# Patient Record
Sex: Male | Born: 1945
Health system: Southern US, Community
[De-identification: ages and names within clinical notes are randomized; demographics above are authoritative.]

## PROBLEM LIST (undated history)

## (undated) DIAGNOSIS — I10 Essential (primary) hypertension: Secondary | ICD-10-CM

## (undated) DIAGNOSIS — I4891 Unspecified atrial fibrillation: Secondary | ICD-10-CM

## (undated) DIAGNOSIS — G4733 Obstructive sleep apnea (adult) (pediatric): Secondary | ICD-10-CM

## (undated) DIAGNOSIS — B9681 Helicobacter pylori [H. pylori] as the cause of diseases classified elsewhere: Principal | ICD-10-CM

## (undated) DIAGNOSIS — G4730 Sleep apnea, unspecified: Secondary | ICD-10-CM

## (undated) DIAGNOSIS — K219 Gastro-esophageal reflux disease without esophagitis: Secondary | ICD-10-CM

## (undated) DIAGNOSIS — IMO0001 Reserved for inherently not codable concepts without codable children: Secondary | ICD-10-CM

## (undated) DIAGNOSIS — K859 Acute pancreatitis without necrosis or infection, unspecified: Secondary | ICD-10-CM

## (undated) DIAGNOSIS — K2970 Gastritis, unspecified, without bleeding: Principal | ICD-10-CM

## (undated) DIAGNOSIS — K2100 Gastro-esophageal reflux disease with esophagitis, without bleeding: Secondary | ICD-10-CM

## (undated) HISTORY — DX: Gastritis, unspecified, without bleeding: K29.70

## (undated) HISTORY — PX: VASECTOMY: SHX75

## (undated) HISTORY — DX: Essential (primary) hypertension: I10

## (undated) HISTORY — DX: Gastro-esophageal reflux disease without esophagitis: K21.9

## (undated) HISTORY — PX: UPPER GASTROINTESTINAL ENDOSCOPY: SHX188

## (undated) HISTORY — PX: CARDIAC ELECTROPHYSIOLOGY STUDY AND ABLATION: SHX1294

## (undated) HISTORY — DX: Helicobacter pylori (H. pylori) as the cause of diseases classified elsewhere: B96.81

## (undated) HISTORY — DX: Unspecified atrial fibrillation: I48.91

---
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
## 2002-07-06 ENCOUNTER — Inpatient Hospital Stay (HOSPITAL_COMMUNITY): Admission: EM | Admit: 2002-07-06 | Discharge: 2002-07-08 | Payer: Self-pay | Admitting: Emergency Medicine

## 2002-07-06 ENCOUNTER — Encounter: Payer: Self-pay | Admitting: *Deleted

## 2002-07-06 ENCOUNTER — Ambulatory Visit (HOSPITAL_COMMUNITY): Admission: RE | Admit: 2002-07-06 | Discharge: 2002-07-06 | Payer: Self-pay | Admitting: Pulmonary Disease

## 2003-02-23 ENCOUNTER — Ambulatory Visit (HOSPITAL_COMMUNITY): Admission: RE | Admit: 2003-02-23 | Discharge: 2003-02-23 | Payer: Self-pay | Admitting: Pulmonary Disease

## 2003-09-24 HISTORY — PX: COLONOSCOPY: SHX174

## 2004-06-12 ENCOUNTER — Emergency Department (HOSPITAL_COMMUNITY): Admission: EM | Admit: 2004-06-12 | Discharge: 2004-06-12 | Payer: Self-pay | Admitting: Family Medicine

## 2004-06-22 ENCOUNTER — Ambulatory Visit (HOSPITAL_COMMUNITY): Admission: RE | Admit: 2004-06-22 | Discharge: 2004-06-22 | Payer: Self-pay | Admitting: Internal Medicine

## 2004-08-30 ENCOUNTER — Ambulatory Visit: Payer: Self-pay | Admitting: Internal Medicine

## 2005-10-16 ENCOUNTER — Ambulatory Visit (HOSPITAL_COMMUNITY): Admission: RE | Admit: 2005-10-16 | Discharge: 2005-10-16 | Payer: Self-pay | Admitting: Pulmonary Disease

## 2006-09-30 ENCOUNTER — Ambulatory Visit (HOSPITAL_COMMUNITY): Admission: RE | Admit: 2006-09-30 | Discharge: 2006-09-30 | Payer: Self-pay | Admitting: Pulmonary Disease

## 2006-10-10 ENCOUNTER — Encounter (HOSPITAL_COMMUNITY): Admission: RE | Admit: 2006-10-10 | Discharge: 2006-11-09 | Payer: Self-pay | Admitting: Pulmonary Disease

## 2009-05-02 ENCOUNTER — Encounter: Payer: Self-pay | Admitting: Cardiology

## 2009-05-09 DIAGNOSIS — I10 Essential (primary) hypertension: Secondary | ICD-10-CM | POA: Insufficient documentation

## 2009-05-18 ENCOUNTER — Ambulatory Visit: Payer: Self-pay | Admitting: Cardiology

## 2009-05-18 DIAGNOSIS — R5383 Other fatigue: Secondary | ICD-10-CM

## 2009-05-18 DIAGNOSIS — R5381 Other malaise: Secondary | ICD-10-CM | POA: Insufficient documentation

## 2009-05-18 DIAGNOSIS — I4892 Unspecified atrial flutter: Secondary | ICD-10-CM | POA: Insufficient documentation

## 2009-05-19 ENCOUNTER — Encounter: Payer: Self-pay | Admitting: Cardiology

## 2009-05-19 ENCOUNTER — Ambulatory Visit (HOSPITAL_COMMUNITY): Admission: RE | Admit: 2009-05-19 | Discharge: 2009-05-19 | Payer: Self-pay | Admitting: Cardiology

## 2009-05-19 ENCOUNTER — Ambulatory Visit: Payer: Self-pay | Admitting: Cardiology

## 2009-05-22 ENCOUNTER — Ambulatory Visit: Payer: Self-pay

## 2009-05-25 ENCOUNTER — Ambulatory Visit: Payer: Self-pay | Admitting: Cardiology

## 2009-05-25 ENCOUNTER — Encounter (INDEPENDENT_AMBULATORY_CARE_PROVIDER_SITE_OTHER): Payer: Self-pay | Admitting: *Deleted

## 2009-05-31 ENCOUNTER — Ambulatory Visit: Payer: Self-pay

## 2009-06-08 ENCOUNTER — Ambulatory Visit: Payer: Self-pay | Admitting: Cardiology

## 2009-06-08 LAB — CONVERTED CEMR LAB: POC INR: 1.5

## 2009-06-15 ENCOUNTER — Ambulatory Visit: Payer: Self-pay | Admitting: Cardiology

## 2009-06-15 LAB — CONVERTED CEMR LAB: POC INR: 2.1

## 2009-06-21 ENCOUNTER — Ambulatory Visit: Payer: Self-pay | Admitting: Cardiology

## 2009-06-21 ENCOUNTER — Ambulatory Visit: Payer: Self-pay | Admitting: Internal Medicine

## 2009-06-28 ENCOUNTER — Ambulatory Visit: Payer: Self-pay | Admitting: Cardiology

## 2009-06-28 ENCOUNTER — Encounter: Payer: Self-pay | Admitting: Internal Medicine

## 2009-06-28 LAB — CONVERTED CEMR LAB: POC INR: 2.7

## 2009-07-05 ENCOUNTER — Ambulatory Visit: Payer: Self-pay | Admitting: Cardiology

## 2009-07-13 ENCOUNTER — Ambulatory Visit: Payer: Self-pay | Admitting: Cardiology

## 2009-07-17 ENCOUNTER — Encounter (INDEPENDENT_AMBULATORY_CARE_PROVIDER_SITE_OTHER): Payer: Self-pay | Admitting: *Deleted

## 2009-07-18 ENCOUNTER — Ambulatory Visit: Payer: Self-pay | Admitting: Cardiology

## 2009-07-21 ENCOUNTER — Ambulatory Visit: Payer: Self-pay | Admitting: Cardiology

## 2009-07-21 ENCOUNTER — Ambulatory Visit: Payer: Self-pay | Admitting: Internal Medicine

## 2009-07-21 LAB — CONVERTED CEMR LAB

## 2009-07-26 ENCOUNTER — Ambulatory Visit: Payer: Self-pay | Admitting: Cardiology

## 2009-08-02 ENCOUNTER — Telehealth: Payer: Self-pay | Admitting: Cardiology

## 2009-08-02 ENCOUNTER — Ambulatory Visit: Payer: Self-pay | Admitting: Cardiology

## 2009-08-03 ENCOUNTER — Encounter: Payer: Self-pay | Admitting: Cardiology

## 2009-08-10 ENCOUNTER — Encounter: Payer: Self-pay | Admitting: Cardiology

## 2009-08-10 ENCOUNTER — Ambulatory Visit (HOSPITAL_COMMUNITY): Admission: RE | Admit: 2009-08-10 | Discharge: 2009-08-10 | Payer: Self-pay | Admitting: Cardiology

## 2009-08-10 LAB — CONVERTED CEMR LAB
Basophils Absolute: 0 10*3/uL (ref 0.0–0.1)
CO2: 27 meq/L (ref 19–32)
Chloride: 97 meq/L (ref 96–112)
Creatinine, Ser: 0.99 mg/dL (ref 0.40–1.50)
Eosinophils Absolute: 0.1 10*3/uL (ref 0.0–0.7)
INR: 1.9
Lymphocytes Relative: 25 % (ref 12–46)
Lymphs Abs: 1.7 10*3/uL (ref 0.7–4.0)
MCHC: 34.7 g/dL (ref 30.0–36.0)
Monocytes Absolute: 0.8 10*3/uL (ref 0.1–1.0)
Monocytes Relative: 12 % (ref 3–12)
Neutro Abs: 4.2 10*3/uL (ref 1.7–7.7)
Neutrophils Relative %: 61 % (ref 43–77)
Platelets: 276 10*3/uL (ref 150–400)
Potassium: 3.9 meq/L (ref 3.5–5.3)
RBC: 4.84 M/uL (ref 4.22–5.81)
RDW: 12.3 % (ref 11.5–15.5)
Sodium: 131 meq/L — ABNORMAL LOW (ref 135–145)
WBC: 6.9 10*3/uL (ref 4.0–10.5)
aPTT: 36 s (ref 24–37)

## 2009-08-14 ENCOUNTER — Ambulatory Visit: Payer: Self-pay | Admitting: Cardiology

## 2009-08-16 ENCOUNTER — Ambulatory Visit (HOSPITAL_COMMUNITY): Admission: RE | Admit: 2009-08-16 | Discharge: 2009-08-17 | Payer: Self-pay | Admitting: Internal Medicine

## 2009-08-17 ENCOUNTER — Ambulatory Visit: Payer: Self-pay | Admitting: Internal Medicine

## 2009-08-21 ENCOUNTER — Ambulatory Visit: Payer: Self-pay | Admitting: Cardiology

## 2009-08-21 LAB — CONVERTED CEMR LAB: POC INR: 1.2

## 2009-08-28 ENCOUNTER — Ambulatory Visit: Payer: Self-pay | Admitting: Cardiovascular Disease

## 2009-09-12 ENCOUNTER — Ambulatory Visit: Payer: Self-pay | Admitting: Internal Medicine

## 2009-11-30 ENCOUNTER — Encounter (INDEPENDENT_AMBULATORY_CARE_PROVIDER_SITE_OTHER): Payer: Self-pay | Admitting: *Deleted

## 2009-12-20 ENCOUNTER — Encounter (INDEPENDENT_AMBULATORY_CARE_PROVIDER_SITE_OTHER): Payer: Self-pay | Admitting: *Deleted

## 2010-10-21 LAB — CONVERTED CEMR LAB
ALT: 28 units/L (ref 0–53)
Albumin: 4.6 g/dL (ref 3.5–5.2)
Basophils Absolute: 0.1 10*3/uL (ref 0.0–0.1)
Basophils Relative: 1 % (ref 0–1)
CO2: 20 meq/L (ref 19–32)
Eosinophils Absolute: 0.2 10*3/uL (ref 0.0–0.7)
HCT: 48.3 % (ref 39.0–52.0)
Lymphocytes Relative: 23 % (ref 12–46)
Lymphs Abs: 2.4 10*3/uL (ref 0.7–4.0)
MCV: 92 fL (ref 78.0–100.0)
Monocytes Absolute: 1.3 10*3/uL — ABNORMAL HIGH (ref 0.1–1.0)
Monocytes Relative: 12 % (ref 3–12)
Neutrophils Relative %: 62 % (ref 43–77)
Platelets: 279 10*3/uL (ref 150–400)
RBC: 5.25 M/uL (ref 4.22–5.81)
Sodium: 139 meq/L (ref 135–145)
Total Bilirubin: 0.5 mg/dL (ref 0.3–1.2)
WBC: 10.3 10*3/uL (ref 4.0–10.5)

## 2010-10-23 NOTE — Letter (Signed)
Summary: Appointment Reminder  [facility]  [address]   [place], [place] [address]   Phone: [phone]  Fax: [phone]       November 30, 2009   Sean Golden [address] [place], [place]  [address] Jun 28, 1946    Dear [name],  We have been unable to reach you by phone to schedule a follow up   appointment that was recommended for you by Dr. Jena Gauss. It is very   important that we reach you to schedule an appointment. We hope that you  allow [place] to participate in your health care needs. Please contact us at  [phone] at your earliest convenience to schedule your appointment.  Sincerely,    [facility] R. Roetta Sessions, M.D.    Kassie Mends, M.D. Lorenza Burton, FNP-BC    Tana Coast, PA-C Phone: [phone]    Fax: [phone]

## 2010-10-23 NOTE — Letter (Signed)
Summary: Generic Letter, Intro to Referring  [facility]  [address]   [place], [place] [address]   Phone: [phone]  Fax: [phone]      December 20, 2009             RE: Sean Golden   Jun 10, 1946                 [address]                 [place], [place]  [address]  Dear Sean Golden,     We received a referral from your office for the patient listed above. We have tried to reach him by phone and by mail to schedule an appointment for consult. He has never responded. Thank you   Sincerely,    [facility] Ph: [phone]   Fax: [phone]

## 2010-12-26 LAB — BASIC METABOLIC PANEL
BUN: 15 mg/dL (ref 6–23)
Glucose, Bld: 111 mg/dL — ABNORMAL HIGH (ref 70–99)

## 2010-12-26 LAB — CBC
HCT: 48.6 % (ref 39.0–52.0)
Hemoglobin: 16.9 g/dL (ref 13.0–17.0)
MCV: 94.2 fL (ref 78.0–100.0)
RDW: 12.3 % (ref 11.5–15.5)
WBC: 8.7 10*3/uL (ref 4.0–10.5)

## 2010-12-26 LAB — PROTIME-INR: INR: 2 — ABNORMAL HIGH (ref 0.00–1.49)

## 2011-02-08 NOTE — Procedures (Signed)
   Sean Golden, Sean Golden                            ACCOUNT NO.:  [id number]   MEDICAL RECORD NO.:  [id number]                   PATIENT TYPE:  INP   LOCATION:  A309                                 FACILITY:  [facility]   PHYSICIAN:  Edward L. Juanetta Gosling, M.D.             DATE OF BIRTH:  12-03-1945   DATE OF PROCEDURE:  07/06/2002  DATE OF DISCHARGE:                                EKG INTERPRETATION   TIME:  1358 .   DESCRIPTION OF PROCEDURE:  The rhythm is a sinus rhythm with a fast heart  rate at about 110.  There are small Q-waves inferiorly which may be of no  significance.   IMPRESSION:  Otherwise, normal electrocardiogram.                                                Edward L. Juanetta Gosling, M.D.    ELH/MEDQ  D:  07/07/2002  T:  07/08/2002  Job:  [id number]

## 2011-02-08 NOTE — H&P (Signed)
Sean Golden, Sean Golden                            ACCOUNT NO.:  [id number]   MEDICAL RECORD NO.:  [id number]                   PATIENT TYPE:  INP   LOCATION:  A309                                 FACILITY:  [facility]   PHYSICIAN:  Edward L. Juanetta Gosling, M.D.             DATE OF BIRTH:  [birth year]   DATE OF ADMISSION:  07/06/2002  DATE OF DISCHARGE:                                HISTORY & PHYSICAL   REASON FOR ADMISSION:  Probable pneumonia.   HISTORY OF PRESENT ILLNESS:  The patient is a 65 year old who was in his  usual state of good health at home when sometime around midnight, he  developed right upper quadrant and right lower chest pain.  This was  pleuritic in nature and has worsened through the day.  He came into the  office where he was noted to have fairly marked pleuritic pain.  He appeared  to be in some moderate distress.  He was found to have clear lungs and  nontender abdomen.  He was sent for ultrasound of the abdomen and then a CT  of the chest to rule out pulmonary embolus.  The CT was negative.  The  ultrasound did not show any definite changes suggestive of a gallbladder  problem, liver disease, etc.  He went to the emergency room.  When he was  seen in the emergency room, he was found to have no evidence of cardiac  disease.  Lab work, in essence, was normal.  He is admitted with a clinical  impression of pneumonia, although this does not show on chest x-ray or CT as  of yet.  He says the pain is tense and pleuritic in nature and he has  difficulty with taking a deep breath particularly on the right side.   PAST MEDICAL HISTORY:  1. Hypertension which was present for awhile and then seemed to go away.  2. Arthritis which had improved with Vioxx which he is not taking anymore.   MEDICATIONS:  No regular prescription medications at this point.   FAMILY HISTORY:  Hypertension.  No known history of lung disease.   SOCIAL HISTORY:  He has worked in Engineering geologist.   REVIEW OF  SYMPTOMS:  Other than as mentioned, negative.   PHYSICAL EXAMINATION:  GENERAL:  Well-developed, uncomfortable appearing  male.  VITAL SIGNS:  Temperature 99 on admission to the floor, heart rate 110,  blood pressure 110/70.  HEENT:  Mucous membranes are dry.  Nose and throat are clear.  Pupils are  reactive.  CHEST:  Does not show any wheezes, rales or rhonchi.  ABDOMEN:  Nontender.  EXTREMITIES:  No edema.  NEUROLOGIC:  Grossly intact.   LABORATORY DATA AND X-RAY FINDINGS:  Unrevealing.   ASSESSMENT:  I think he has pneumonia.    PLAN:  1. Treat him aggressively for pain syndrome, take deeper breaths on that     right side.  2. Will repeat CBC and chest x-ray in the morning.  3. He will be on Levaquin intravenously in the meantime.                                               Edward L. Juanetta Gosling, M.D.    ELH/MEDQ  D:  07/06/2002  T:  07/07/2002  Job:  [id number]

## 2011-02-08 NOTE — Group Therapy Note (Signed)
   Sean Golden, Sean Golden                            ACCOUNT NO.:  [id number]   MEDICAL RECORD NO.:  [id number]                   PATIENT TYPE:  INP   LOCATION:  A309                                 FACILITY:  [facility]   PHYSICIAN:  Edward L. Juanetta Gosling, M.D.             DATE OF BIRTH:  [birth year]   DATE OF PROCEDURE:  07/07/2002  DATE OF DISCHARGE:                                   PROGRESS NOTE   PROBLEM:  Febrile illness, probably pneumonia.   SUBJECTIVE:  The patient says he feels great this morning.  He can take a  deep breath.  He is able to move around.  He ate this morning and feels much  better in general.   PHYSICAL EXAMINATION:  CHEST:  Quite clear.  HEART:  Regular.  ABDOMEN:  Soft.  EXTREMITIES:  No edema.   LABORATORY DATA:  White count is about 5000 today.   ASSESSMENT:  He is better.   PLAN:  The plan is for chest x-ray this morning.                                               Edward L. Juanetta Gosling, M.D.    ELH/MEDQ  D:  07/07/2002  T:  07/08/2002  Job:  [id number]

## 2011-02-08 NOTE — Group Therapy Note (Signed)
   NAMENYRON, MOZER                            ACCOUNT NO.:  [id number]   MEDICAL RECORD NO.:  [id number]                   PATIENT TYPE:   LOCATION:                                       FACILITY:   PHYSICIAN:  Edward L. Juanetta Gosling, M.D.             DATE OF BIRTH:   DATE OF PROCEDURE:  07/08/2002  DATE OF DISCHARGE:                                   PROGRESS NOTE   PROBLEM:  Pneumonia.   SUBJECTIVE:  This patient says that he feels great and has no complaints at  all.   OBJECTIVE:  He has been afebrile since about 4 o'clock yesterday.  His chest  is perfectly clear.  His heart is regular.  His abdomen is soft.  His blood  cultures are negative thus far.   ASSESSMENT:  He is improved.   PLAN:  Plan is for discharge home.  Please see discharge summary for  details.                                               Edward L. Juanetta Gosling, M.D.    ELH/MEDQ  D:  07/08/2002  T:  07/09/2002  Job:  [id number]

## 2011-02-08 NOTE — Procedures (Signed)
   NAMECARSIN, RANDAZZO                            ACCOUNT NO.:  [id number]   MEDICAL RECORD NO.:  [id number]                   PATIENT TYPE:  INP   LOCATION:  A309                                 FACILITY:  [facility]   PHYSICIAN:  Edward L. Juanetta Gosling, M.D.             DATE OF BIRTH:  30-Oct-1945   DATE OF PROCEDURE:  DATE OF DISCHARGE:                                EKG INTERPRETATION   The rhythm is a sinus rhythm with a tachycardiac rate of about 110.  There  are small Q-waves inferiorly, and clinical correlation is being suggested.   IMPRESSION:  Normal electrocardiogram.                                               Edward L. Juanetta Gosling, M.D.    ELH/MEDQ  D:  07/06/2002  T:  07/07/2002  Job:  [id number]

## 2011-02-08 NOTE — Group Therapy Note (Signed)
   NAMERIKI, GEHRING                            ACCOUNT NO.:  [id number]   MEDICAL RECORD NO.:  [id number]                   PATIENT TYPE:  INP   LOCATION:  A309                                 FACILITY:  [facility]   PHYSICIAN:  Edward L. Juanetta Gosling, M.D.             DATE OF BIRTH:  June 21, 1946   DATE OF PROCEDURE:  07/07/2002  DATE OF DISCHARGE:                                   PROGRESS NOTE   PROBLEM:  Clinical diagnosis of pneumonia.   SUBJECTIVE:  The patient has had some fever throughout the day.  He actually  says that he feels pretty well.  He denies any new complaints.  He is having  no pain in his chest.  He is not short of breath like he was yesterday.  He  is not coughing.   OBJECTIVE:  His exam shows that his chest is pretty clear.  Temperature has  been over 100 most of the day today, but now is down to 98.6.  Blood counts  normal.  His chest x-ray showed areas of atelectasis, but no definite  pneumonia.   ASSESSMENT:  I still think he has pneumonia, at least by clinical criteria.   PLAN:  My plan is to have him continue current medications and treatments,  no changes today, and I am going to stop his IV fluids as he is eating and  drinking well, and may well be able to be discharged tomorrow, depending on  how he does.                                               Edward L. Juanetta Gosling, M.D.    ELH/MEDQ  D:  07/07/2002  T:  07/08/2002  Job:  [id number]

## 2011-02-08 NOTE — Op Note (Signed)
Sean Golden, Sean Golden                ACCOUNT NO.:  [id number]   MEDICAL RECORD NO.:  [id number]          PATIENT TYPE:  AMB   LOCATION:  DAY                           FACILITY:  [facility]   PHYSICIAN:  R. Roetta Sessions, M.D. DATE OF BIRTH:  Feb 10, 1946   DATE OF PROCEDURE:  06/22/2004  DATE OF DISCHARGE:                                 OPERATIVE REPORT   PROCEDURE:  Esophagogastroduodenoscopy followed by colonoscopy with biopsy.   INDICATIONS:  The patient is a 65 year old gentleman with worsening of  gastroesophageal reflux disease.  Symptoms he describes as heartburn, taking  Prilosec over-the-counter only sporadically.  Has noted some early satiety  and anorexia the past couple of weeks.  He has not had any abdominal pain,  no melena or rectal bleeding.  Also noted a decrease in stool frequency and  caliber of stool over this same period of time.  No family history of  colorectal neoplasia.  He has never had his colon imaged previously.  EGD  and colonoscopy are now being done.  This approach has been discussed with  the patient at length at the bedside.  The potential risks, benefits and  alternatives have been reviewed.  He is agreeable.  Please see my  handwritten H&P.   DESCRIPTION OF PROCEDURE:  Oxygen saturation, blood pressure, pulse and  respiration were monitored throughout the entirety of both procedures.  Conscious sedation with Versed 5 mg IV and Demerol 75 mg IV in divided  doses.  Cetacaine spray for topical pharyngeal anesthesia.  The instrument  was the Olympus video chip system.   EGD FINDINGS:  Esophagus:  Examination of the tubular esophagus revealed  four-quadrant distal esophageal erosions,.  Each was 2-3 cm in length  extending up from the EG junction.  There was no evidence of Barrett's  esophagus or neoplasm. The EG junction was easily traversed.   Stomach:  The gastric cavity was emptied and insufflated with air.  A  thorough examination of the gastric mucosa  including retroflexion of the  proximal stomach and esophagogastric junction demonstrated no abnormalities.  Pylorus was patent and easily traversed.  Examination of the bulb and second  portion revealed no abnormalities.   THERAPY AND DIAGNOSTIC MANEUVERS:  None.   The patient tolerated the procedure well and was prepared for colonoscopy.   COLONOSCOPY FINDINGS:  Digital rectal exam revealed no abnormalities.  Prep  was good.   Rectum:  Examination of the rectal mucosa revealed an abnormality 10 cm in  from the anal verge.  This was a 2 x 3 cm, somewhat raised adenomatous,  somewhat firm and fibrotic-appearing lesion with multiple overlying  ulcerations.  The longest ulcer was a furrowed, narrow, 2-3 cm ulcer.  There  was a couple of smaller ulcers.  There was also a 3 mm ulcer above this area  in what otherwise appeared to be normal mucosa.  Again this lesion was 10 cm  in from the anal verge.  A thorough examination of the rectum including  retroflexed view of the distal rectum failed to demonstrate any other  abnormalities.  Please  see photos.   Colon:  Colonic mucosa was surveyed from the rectosigmoid junction through  the left, transverse,  right colon to the area of the appendiceal orifice,  ileocecal valve and cecum. These structures were well seen and photographed  for the record.  From this level the scope was slowly withdrawn.  All  previously mentioned mucosal surfaces were again.  The terminal ileum was  also intubated to 5 cm.  The terminal ileum appeared normal.  The patient  had a multitude of diverticula extending all the way from the rectosigmoid  junction to the cecum (where there were multiple).  They were throughout the  colon.  However, the remainder of the colonic mucosa appeared entirely  normal.  The lesion in the rectum was biopsied multiple times.  The patient  tolerated both procedures well and was reactive to endoscopy.   IMPRESSION:   Esophagogastroduodenoscopy:  Four-quadrant distal esophageal  erosions consistent with moderately severe erosive reflux esophagitis;  otherwise normal esophagus, normal stomach, normal D1 and D2.   Colonoscopy:  1.  A 2 x 3 cm area of abnormal-appearing fibrotic mucosa with overlying      ulcerations and at least one satellite ulcer as described above      biopsied.  This was entered 10 cm from the anal verge.  The remainder of      the rectal mucosa appeared normal.   1.  Densely populated pancolonic diverticulum.  The remainder of the colonic      mucosa and terminal ileum appeared normal.   DISCUSSION:  Esophagogastroduodenoscopy:  Four-quadrant distal esophageal  erosions consistent with moderately severe erosive reflux esophagitis;  otherwise normal esophagus, normal stomach, normal D1 and D2.   Colonoscopy:  1.  A 2 x 3 cm area of abnormal-appearing fibrotic mucosa with overlying      ulcerations and at least one satellite ulcer as described above      biopsied.  This was entered 10 cm from the anal verge.  The remainder of      the rectal mucosa appeared normal.   1.  Densely populated pancolonic diverticulum.  The remainder of the colonic      mucosa and terminal ileum appeared normal.   The patient has gastroesophageal reflux disease and it is complicated.  He  needs an anti-reflux diet / lifestyle and needs to be on a proton pump  inhibitor every day from here on out.  Specifically, I have given him  literature on gastroesophageal reflux disease and will start him on Aciphex  20 mg orally daily.  He was admonished to take this medication every day  before breakfast.   As far as findings on colonoscopy are colonoscopy are concerned, I suspect  this is a benign lesion and perhaps a stercoral ulcer or solitary rectal  ulcer syndrome.  This can be seen in a setting of longstanding constipation  rather than producing a symptom of constipation.  Not mentioned above, there is  no history of radiation therapy to the rectum  or prostate.   RECOMMENDATIONS:  1.  He needs to bulk up his diet with fiber in the way of Metamucil,      Citrucel or Benefiber every day.  2.  Initiate Crystallose laxative therapy 20 g orally at bedtime.  3.  Will consider topical anti-inflammatory therapy pending results of the      rectal biopsies which should be on the first of next week.     Otelia Sergeant   RMR/MEDQ  D:  06/22/2004  T:  06/23/2004  Job:  [id number]   cc:   Ramon Dredge L. Juanetta Gosling, M.D.  [address]  [place]  [place] [address]  Fax: [phone]

## 2011-02-08 NOTE — Discharge Summary (Signed)
   Sean Golden, Sean Golden                            ACCOUNT NO.:  [id number]   MEDICAL RECORD NO.:  [id number]                   PATIENT TYPE:  INP   LOCATION:  A309                                 FACILITY:  [facility]   PHYSICIAN:  Edward L. Juanetta Gosling, M.D.             DATE OF BIRTH:  05/13/1946   DATE OF ADMISSION:  07/06/2002  DATE OF DISCHARGE:  07/08/2002                                 DISCHARGE SUMMARY   DISCHARGE DIAGNOSIS:  Pneumonia.   BRIEF HISTORY:  Sean Golden is a 65 year old with complaints of shortness of  breath, right-sided chest discomfort and fever.  He developed this about 12  hours prior to admission.  He came to my office, and he was evaluated and  sent for x-rays.  An ultrasound of the abdomen was negative for any sort of  gallbladder problems.  Then, he had a pulmonary embolism study CT, which was  negative for pulmonary emboli.  He was treated with pain medication in the  emergency room, and he has had so much pain, I doubt he will be discharged.   PHYSICAL EXAMINATION:  GENERAL:  His examination showed that he appeared to  be in some acute pain.  CHEST:  His chest showed that he had a stenting of the right side of his  back and his abdomen was nontender.  CARDIAC:  His heart was regular.   LABORATORY AND ACCESSORY DATA:  His white blood count was normal.  His PO2  is in the 50s on room air.   HOSPITAL COURSE/MEDICATIONS:  1. Morphine sulfate intravenously for pain.  2. Vioxx 50 mg for pain.  3. Levaquin.   He showed rapid improvement, and by 36 hours or so into his hospitalization,  he was improved enough to be discharged.  He was afebrile for about 12 hours  prior to that, had no pain, and had not required any more of his pain  medications.   DISCHARGE MEDICATIONS:  1. Levaquin 500 mg daily x 10 more days.  2. Vioxx 25 mg daily.  3. Codiclear DH 5 cc q. 4 hours p.r.n. cough.  4. Vicodin as needed for pain.     Edward L. Juanetta Gosling, M.D.    ELH/MEDQ  D:  07/08/2002  T:  07/08/2002  Job:  [id number]

## 2011-09-09 ENCOUNTER — Encounter: Payer: Self-pay | Admitting: Cardiology

## 2014-02-23 DIAGNOSIS — H5200 Hypermetropia, unspecified eye: Secondary | ICD-10-CM | POA: Diagnosis not present

## 2014-02-23 DIAGNOSIS — H269 Unspecified cataract: Secondary | ICD-10-CM | POA: Diagnosis not present

## 2014-02-23 DIAGNOSIS — I10 Essential (primary) hypertension: Secondary | ICD-10-CM | POA: Diagnosis not present

## 2014-02-23 DIAGNOSIS — H35039 Hypertensive retinopathy, unspecified eye: Secondary | ICD-10-CM | POA: Diagnosis not present

## 2014-05-23 DIAGNOSIS — R7989 Other specified abnormal findings of blood chemistry: Secondary | ICD-10-CM | POA: Diagnosis not present

## 2014-05-23 DIAGNOSIS — I10 Essential (primary) hypertension: Secondary | ICD-10-CM | POA: Diagnosis not present

## 2014-05-23 DIAGNOSIS — R0602 Shortness of breath: Secondary | ICD-10-CM | POA: Diagnosis not present

## 2014-05-23 DIAGNOSIS — E785 Hyperlipidemia, unspecified: Secondary | ICD-10-CM | POA: Diagnosis not present

## 2014-05-24 DIAGNOSIS — I4891 Unspecified atrial fibrillation: Secondary | ICD-10-CM

## 2014-05-24 HISTORY — DX: Unspecified atrial fibrillation: I48.91

## 2014-05-27 ENCOUNTER — Ambulatory Visit (INDEPENDENT_AMBULATORY_CARE_PROVIDER_SITE_OTHER): Payer: Medicare Other | Admitting: Cardiovascular Disease

## 2014-05-27 VITALS — BP 106/76 | HR 100 | Ht 71.0 in | Wt 250.0 lb

## 2014-05-27 DIAGNOSIS — R0602 Shortness of breath: Secondary | ICD-10-CM | POA: Diagnosis not present

## 2014-05-27 DIAGNOSIS — I517 Cardiomegaly: Secondary | ICD-10-CM

## 2014-05-27 DIAGNOSIS — I4891 Unspecified atrial fibrillation: Secondary | ICD-10-CM | POA: Diagnosis not present

## 2014-05-27 DIAGNOSIS — G4730 Sleep apnea, unspecified: Secondary | ICD-10-CM

## 2014-05-27 DIAGNOSIS — R5383 Other fatigue: Secondary | ICD-10-CM

## 2014-05-27 DIAGNOSIS — R5381 Other malaise: Secondary | ICD-10-CM | POA: Diagnosis not present

## 2014-05-27 DIAGNOSIS — I7789 Other specified disorders of arteries and arterioles: Secondary | ICD-10-CM | POA: Insufficient documentation

## 2014-05-27 DIAGNOSIS — Z8679 Personal history of other diseases of the circulatory system: Secondary | ICD-10-CM

## 2014-05-27 DIAGNOSIS — I480 Paroxysmal atrial fibrillation: Secondary | ICD-10-CM | POA: Insufficient documentation

## 2014-05-27 DIAGNOSIS — I10 Essential (primary) hypertension: Secondary | ICD-10-CM

## 2014-05-27 LAB — CBC
HEMATOCRIT: 45 % (ref 39.0–52.0)
Hemoglobin: 15.8 g/dL (ref 13.0–17.0)
MCH: 31.5 pg (ref 26.0–34.0)
MCHC: 35.1 g/dL (ref 30.0–36.0)
MCV: 89.6 fL (ref 78.0–100.0)
Platelets: 325 10*3/uL (ref 150–400)
RBC: 5.02 MIL/uL (ref 4.22–5.81)
RDW: 13.6 % (ref 11.5–15.5)
WBC: 10.3 10*3/uL (ref 4.0–10.5)

## 2014-05-27 LAB — BASIC METABOLIC PANEL
BUN: 17 mg/dL (ref 6–23)
CO2: 21 mEq/L (ref 19–32)
Calcium: 9.5 mg/dL (ref 8.4–10.5)
Chloride: 105 mEq/L (ref 96–112)
Creat: 1.01 mg/dL (ref 0.50–1.35)
Glucose, Bld: 115 mg/dL — ABNORMAL HIGH (ref 70–99)
POTASSIUM: 4.4 meq/L (ref 3.5–5.3)
SODIUM: 138 meq/L (ref 135–145)

## 2014-05-27 LAB — T4, FREE: FREE T4: 1.02 ng/dL (ref 0.80–1.80)

## 2014-05-27 LAB — TSH: TSH: 1.537 u[IU]/mL (ref 0.350–4.500)

## 2014-05-27 MED ORDER — DILTIAZEM HCL ER COATED BEADS 120 MG PO CP24: 120.0000 mg | ORAL_CAPSULE | Freq: Every day | ORAL | Status: DC

## 2014-05-27 MED ORDER — LOSARTAN POTASSIUM 50 MG PO TABS: 50.0000 mg | ORAL_TABLET | Freq: Every day | ORAL | Status: DC

## 2014-05-27 MED ORDER — APIXABAN 5 MG PO TABS: 5.0000 mg | ORAL_TABLET | Freq: Two times a day (BID) | ORAL | Status: DC

## 2014-05-27 NOTE — Patient Instructions (Addendum)
Your physician recommends that you schedule a follow-up appointment in: 2 weeks    Your physician has recommended you make the following change in your medication:   STOP Aspirin  DECREASE Losartan to 50 mg daily    START Eliquis 5 mg twice a day I have given you samples today plus a free 30 day supply rx card   START Cardizem 120 mg daily   Please get blood work today (CBC,BMET,TSH,Free T4)     Thank you for choosing [facility] !

## 2014-05-27 NOTE — Progress Notes (Signed)
Patient ID: Sean Golden, male   DOB: [birth year], 68 y.o.   MRN: [id number]       CARDIOLOGY CONSULT NOTE  Patient ID: Sean Golden MRN: [id number] DOB/AGE: 1946/07/13 68 y.o.  Admit date: (Not on file) Primary Physician HAWKINS,EDWARD L, MD  Reason for Consultation: SOB, weakness, h/o atrial flutter ablation  HPI: The patient is a 68 year old male with a past medical history significant for an atrial flutter ablation several years ago as well as hypertension and gastroesophageal reflux disease. He has been experiencing exertional dyspnea and diminished energy levels for the past one year, but moreso over the past month. He is on testosterone replacement therapy but this has not helped alleviate his symptoms. A recent hemoglobin A1c was 5.8%. He also reportedly has a history of hyperlipidemia. He underwent pulmonary function testing which demonstrated normal spirometry. An echocardiogram performed in 2010 demonstrated normal left ventricular systolic function, EF 33-35%, mild LVH, grade 2 diastolic dysfunction, mild aortic root dilatation, mild right ventricular enlargement and mild left atrial enlargement. He occasionally has what he describes as "chest pings" which are infrequent, lasting a second, and spontaneously resolve. He denies orthopnea, paroxysmal nocturnal dyspnea, palpitations, lightheadedness, dizziness, leg swelling and syncope. He denies exertional chest discomfort. Upon further discussion, his wife tells him that he snores a lot and stops breathing at night.  ECG performed in the office today demonstrates atrial fibrillation and flutter with a rapid ventricular response, heart rate 109 beats per minute.  Soc: Nonsmoker. Married.  Fam: Brother has arrhythmia and needs ablation. Father died of MI at 65.  No Known Allergies  Current Outpatient Prescriptions  Medication Sig Dispense Refill  . aspirin 81 MG tablet Take 81 mg by mouth daily.      Marland Kitchen losartan (COZAAR) 100  MG tablet Take 100 mg by mouth daily.       . pantoprazole (PROTONIX) 40 MG tablet Take 40 mg by mouth daily.        No current facility-administered medications for this visit.    Past Medical History  Diagnosis Date  . Hypertension     No past surgical history on file.  History   Social History  . Marital Status: Married    Spouse Name: N/A    Number of Children: N/A  . Years of Education: N/A   Occupational History  . Full time    Social History Main Topics  . Smoking status: Former Smoker -- 0.50 packs/day    Types: Cigarettes    Start date: 05/28/1967    Quit date: 05/27/1984  . Smokeless tobacco: Not on file  . Alcohol Use: Yes  . Drug Use: Not on file  . Sexual Activity: Not on file   Other Topics Concern  . Not on file   Social History Narrative   Married   No regular exercise     No family history of premature CAD in 1st degree relatives.  Prior to Admission medications   Medication Sig Start Date End Date Taking? Authorizing Provider  aspirin 81 MG tablet Take 81 mg by mouth daily.   Yes Historical Provider, MD  losartan (COZAAR) 100 MG tablet Take 100 mg by mouth daily.  05/25/14   Historical Provider, MD  pantoprazole (PROTONIX) 40 MG tablet Take 40 mg by mouth daily.  05/25/14   Historical Provider, MD     Review of systems complete and found to be negative unless listed above in HPI     Physical exam Blood pressure  106/76, pulse 100, height 5\' 11"  (1.803 m), weight 250 lb (113.399 kg). General: NAD, overweight Neck: No JVD, no thyromegaly or thyroid nodule.  Lungs: Clear to auscultation bilaterally with normal respiratory effort. CV: Nondisplaced PMI. Irregular rhythm, normal S1/S2, no S3, no murmur.  No peripheral edema.  No carotid bruit.  Normal pedal pulses.  Abdomen: Soft, nontender, no hepatosplenomegaly, no distention.  Skin: Intact without lesions or rashes.  Neurologic: Alert and oriented x 3.  Psych: Normal affect. Extremities:  No clubbing or cyanosis.  HEENT: Normal.   ECG: Most recent ECG reviewed.  Labs:   Lab Results  Component Value Date   WBC 8.7 08/16/2009   HGB 16.9 08/16/2009   HCT 48.6 08/16/2009   MCV 94.2 08/16/2009   PLT 291 08/16/2009   No results found for this basename: NA, K, CL, CO2, BUN, CREATININE, CALCIUM, LABALBU, PROT, BILITOT, ALKPHOS, ALT, AST, GLUCOSE,  in the last 168 hours No results found for this basename: CKTOTAL, CKMB, CKMBINDEX, TROPONINI    No results found for this basename: CHOL   No results found for this basename: HDL   No results found for this basename: LDLCALC   No results found for this basename: TRIG   No results found for this basename: CHOLHDL   No results found for this basename: LDLDIRECT         Studies: No results found.  ASSESSMENT AND PLAN:  1. SOB and fatigue: His symptoms are likely reflective of new onset and rapid atrial fibrillation. Potential etiologies include long-standing essential hypertension, undiagnosed hypothyroidism, and what appears to be undiagnosed sleep apnea. I will obtain a TSH and free T4. Further management discussed below.  2. New-onset atrial fibrillation: His symptoms are likely reflective of new onset and rapid atrial fibrillation. Potential etiologies include long-standing essential hypertension, undiagnosed hypothyroidism, and what appears to be undiagnosed sleep apnea. I will obtain a TSH and free T4.  I will start long-acting diltiazem 120 mg daily for heart rate control. In order to prevent hypotension, I will reduce losartan to 50 mg daily. CHADS-VASC score 2 (age, HTN) thus at moderate risk for CVA. For this reason, I will initiate anticoagulation with Eliquis 5 mg twice daily. I will also obtain a CBC and basic metabolic panel. Once his heart rate is under better control, I will obtain an echocardiogram to assess left ventricular systolic function in order to make certain he has not developed a tachycardia mediated  cardiomyopathy. I will also assess left atrial size, right ventricular size and aortic root diameter.  3. Sleep apnea: I will eventually obtain a sleep study as he has classic symptoms of obstructive sleep apnea. This is a known etiology of atrial fibrillation as well.  4. Aortic root dilatation: Mildly dilated in 2010 with a diameter of 41 mm. I will reassess with an echocardiogram in the near future.  5. Essential HTN: It is presently low normal. As I mentioned acting diltiazem, and in order to prevent hypotension, I will decrease losartan to 50 mg daily.  6. RV enlargement: I will reassess with an echocardiogram in the near future.  Dispo: f/u 2 weeks.   Signed: Kate Sable, M.D., F.A.C.C.  05/27/2014, 8:56 AM

## 2014-05-31 ENCOUNTER — Telehealth: Payer: Self-pay | Admitting: *Deleted

## 2014-05-31 NOTE — Telephone Encounter (Signed)
Message copied by Desma Mcgregor on Tue May 31, 2014 11:52 AM ------      Message from: Kate Sable A      Created: Tue May 31, 2014 11:29 AM       Ok. ------

## 2014-05-31 NOTE — Telephone Encounter (Signed)
Notified pt of results. Forwarded to Dr. Luan Pulling

## 2014-06-08 ENCOUNTER — Encounter: Payer: Self-pay | Admitting: Cardiovascular Disease

## 2014-06-08 ENCOUNTER — Ambulatory Visit (INDEPENDENT_AMBULATORY_CARE_PROVIDER_SITE_OTHER): Payer: Medicare Other | Admitting: Cardiovascular Disease

## 2014-06-08 VITALS — BP 130/96 | HR 83 | Ht 71.0 in | Wt 249.0 lb

## 2014-06-08 DIAGNOSIS — I517 Cardiomegaly: Secondary | ICD-10-CM

## 2014-06-08 DIAGNOSIS — R5381 Other malaise: Secondary | ICD-10-CM

## 2014-06-08 DIAGNOSIS — I10 Essential (primary) hypertension: Secondary | ICD-10-CM | POA: Diagnosis not present

## 2014-06-08 DIAGNOSIS — I7789 Other specified disorders of arteries and arterioles: Secondary | ICD-10-CM

## 2014-06-08 DIAGNOSIS — I4891 Unspecified atrial fibrillation: Secondary | ICD-10-CM

## 2014-06-08 DIAGNOSIS — G4730 Sleep apnea, unspecified: Secondary | ICD-10-CM

## 2014-06-08 DIAGNOSIS — R5383 Other fatigue: Secondary | ICD-10-CM

## 2014-06-08 DIAGNOSIS — R0602 Shortness of breath: Secondary | ICD-10-CM

## 2014-06-08 NOTE — Patient Instructions (Signed)
Your physician recommends that you schedule a follow-up appointment in: 1 month with Dr. Bronson Ing  Your physician recommends that you continue on your current medications as directed. Please refer to the Current Medication list given to you today.  Your physician has requested that you have an echocardiogram. Echocardiography is a painless test that uses sound waves to create images of your heart. It provides your doctor with information about the size and shape of your heart and how well your heart's chambers and valves are working. This procedure takes approximately one hour. There are no restrictions for this procedure.  Your physician has recommended that you have a sleep study. This test records several body functions during sleep, including: brain activity, eye movement, oxygen and carbon dioxide blood levels, heart rate and rhythm, breathing rate and rhythm, the flow of air through your mouth and nose, snoring, body muscle movements, and chest and belly movement.  Thank you for choosing [facility]!!

## 2014-06-08 NOTE — Progress Notes (Signed)
Patient ID: Sean Golden, male   DOB: 08/04/1946, 68 y.o.   MRN: [id number]      SUBJECTIVE: The patient returns for follow up of new onset atrial fibrillation. He previously complained of shortness of breath and fatigue.TSH, free T4, basic metabolic panel and CBC were all normal. He is less short of breath now, but does say energy levels remain somewhat low. He had a nosebleed yesterday but denies any hematuria and hematochezia/melena.   Review of Systems: As per "subjective", otherwise negative.  No Known Allergies  Current Outpatient Prescriptions  Medication Sig Dispense Refill  . apixaban (ELIQUIS) 5 MG TABS tablet Take 1 tablet (5 mg total) by mouth 2 (two) times daily.  60 tablet  6  . diltiazem (CARDIZEM CD) 120 MG 24 hr capsule Take 1 capsule (120 mg total) by mouth daily.  90 capsule  3  . losartan (COZAAR) 50 MG tablet Take 1 tablet (50 mg total) by mouth daily.  90 tablet  3  . pantoprazole (PROTONIX) 40 MG tablet Take 40 mg by mouth daily.        No current facility-administered medications for this visit.    Past Medical History  Diagnosis Date  . Hypertension     No past surgical history on file.  History   Social History  . Marital Status: Married    Spouse Name: N/A    Number of Children: N/A  . Years of Education: N/A   Occupational History  . Full time    Social History Main Topics  . Smoking status: Former Smoker -- 0.50 packs/day for 15 years    Types: Cigarettes    Start date: 05/28/1967    Quit date: 05/27/1984  . Smokeless tobacco: Never Used  . Alcohol Use: Yes  . Drug Use: Not on file  . Sexual Activity: Not on file   Other Topics Concern  . Not on file   Social History Narrative   Married   No regular exercise     Filed Vitals:   06/08/14 1251  BP: 130/96  Pulse: 83  Height: 5\' 11"  (1.803 m)  Weight: 249 lb (112.946 kg)  SpO2: 97%    PHYSICAL EXAM General: NAD, obese HEENT: Normal. Neck: No JVD, no  thyromegaly. Lungs: Clear to auscultation bilaterally with normal respiratory effort. CV: Nondisplaced PMI.  Regular rate and rhythm, normal S1/S2, no S3/S4, no murmur. No pretibial or periankle edema.  No carotid bruit.  Normal pedal pulses.  Abdomen: Soft, nontender, no hepatosplenomegaly, no distention.  Neurologic: Alert and oriented x 3.  Psych: Normal affect. Skin: Normal. Musculoskeletal: Normal range of motion, no gross deformities. Extremities: No clubbing or cyanosis.   ECG: Most recent ECG reviewed.      ASSESSMENT AND PLAN: 1. SOB and fatigue: His symptoms have improved with respect to shortness of breath, but energy levels remain low. Symptoms were likely reflective of new onset and rapid atrial fibrillation. Rate is now controlled and rhythm is regular. Potential etiologies for atrial fibrillation include long-standing essential hypertension and what appears to be undiagnosed sleep apnea.  Will pursue a sleep study.  2. New-onset atrial fibrillation: Rhythm is now regular and rate is normal. Potential etiologies for atrial fibrillation include long-standing essential hypertension and what appears to be undiagnosed sleep apnea. TSH and free T4 were normal. I will continue long-acting diltiazem 120 mg daily for heart rate control. CHADS-VASC score 2 (age, HTN) thus at moderate risk for CVA. For this reason, I will continue  anticoagulation with Eliquis 5 mg twice daily. I asked him to monitor for further nosebleeds. Now that heart rate is under control, I will obtain an echocardiogram to assess left ventricular systolic function in order to make certain he has not developed a tachycardia mediated cardiomyopathy. I will also assess left atrial size, right ventricular size and aortic root diameter.   3. Sleep apnea: I will obtain a sleep study as he has classic symptoms of obstructive sleep apnea. This is a known etiology of atrial fibrillation as well. I warned him about other  deleterious effects of untreated sleep apnea including MI.  4. Aortic root dilatation: Mildly dilated in 2010 with a diameter of 41 mm. I will reassess with an echocardiogram.   5. Essential HTN: Mildly elevated DBP. Would consider increasing losartan in the future if this persists.   6. RV enlargement: I will reassess with an echocardiogram.   Dispo: f/u 1 month.   Kate Sable, M.D., F.A.C.C.

## 2014-06-09 ENCOUNTER — Ambulatory Visit (HOSPITAL_COMMUNITY)
Admission: RE | Admit: 2014-06-09 | Discharge: 2014-06-09 | Disposition: A | Discharge status: Home / Self Care | Payer: Medicare Other | Source: Ambulatory Visit | Attending: Cardiovascular Disease | Admitting: Cardiovascular Disease

## 2014-06-09 DIAGNOSIS — I059 Rheumatic mitral valve disease, unspecified: Secondary | ICD-10-CM | POA: Insufficient documentation

## 2014-06-09 DIAGNOSIS — R0609 Other forms of dyspnea: Secondary | ICD-10-CM | POA: Diagnosis not present

## 2014-06-09 DIAGNOSIS — I4891 Unspecified atrial fibrillation: Secondary | ICD-10-CM | POA: Diagnosis not present

## 2014-06-09 DIAGNOSIS — I10 Essential (primary) hypertension: Secondary | ICD-10-CM | POA: Insufficient documentation

## 2014-06-09 DIAGNOSIS — I517 Cardiomegaly: Secondary | ICD-10-CM | POA: Diagnosis not present

## 2014-06-09 DIAGNOSIS — R079 Chest pain, unspecified: Secondary | ICD-10-CM | POA: Insufficient documentation

## 2014-06-09 DIAGNOSIS — I079 Rheumatic tricuspid valve disease, unspecified: Secondary | ICD-10-CM | POA: Insufficient documentation

## 2014-06-09 DIAGNOSIS — R0989 Other specified symptoms and signs involving the circulatory and respiratory systems: Secondary | ICD-10-CM | POA: Diagnosis not present

## 2014-06-09 NOTE — Progress Notes (Signed)
  Echocardiogram 2D Echocardiogram has been performed.  [place], [place] 06/09/2014, 4:03 PM

## 2014-06-10 ENCOUNTER — Ambulatory Visit: Payer: Medicare Other | Attending: Cardiovascular Disease | Admitting: Sleep Medicine

## 2014-06-10 VITALS — Ht 71.0 in | Wt 250.0 lb

## 2014-06-10 DIAGNOSIS — G4761 Periodic limb movement disorder: Secondary | ICD-10-CM | POA: Diagnosis not present

## 2014-06-10 DIAGNOSIS — R635 Abnormal weight gain: Secondary | ICD-10-CM | POA: Insufficient documentation

## 2014-06-10 DIAGNOSIS — G4733 Obstructive sleep apnea (adult) (pediatric): Secondary | ICD-10-CM | POA: Insufficient documentation

## 2014-06-10 DIAGNOSIS — G4730 Sleep apnea, unspecified: Secondary | ICD-10-CM | POA: Diagnosis present

## 2014-06-10 DIAGNOSIS — G4710 Hypersomnia, unspecified: Secondary | ICD-10-CM | POA: Diagnosis not present

## 2014-06-17 NOTE — Sleep Study (Signed)
  [place] A. Merlene Laughter, MD     [email or web address]        NOCTURNAL POLYSOMNOGRAM    LOCATION: SLEEP LAB FACILITY: [facility]   PHYSICIAN:  A. Merlene Laughter, M.D.   DATE OF STUDY: 06/10/2014.   REFERRING PHYSICIAN: Harriet Butte.  INDICATIONS: The patient is a 68 year old who presents with loud snoring, witnessed apnea and increased weight gain.  MEDICATIONS:  Prior to Admission medications   Medication Sig Start Date End Date Taking? Authorizing Provider  apixaban (ELIQUIS) 5 MG TABS tablet Take 1 tablet (5 mg total) by mouth 2 (two) times daily. 05/27/14   Herminio Commons, MD  diltiazem (CARDIZEM CD) 120 MG 24 hr capsule Take 1 capsule (120 mg total) by mouth daily. 05/27/14   Herminio Commons, MD  losartan (COZAAR) 50 MG tablet Take 1 tablet (50 mg total) by mouth daily. 05/27/14   Herminio Commons, MD  pantoprazole (PROTONIX) 40 MG tablet Take 40 mg by mouth daily.  05/25/14   Historical Provider, MD      EPWORTH SLEEPINESS SCALE: 8.   BMI: 35.   ARCHITECTURAL SUMMARY: Total recording time was 432 minutes. Sleep efficiency 72 %. Sleep latency 23 minutes. REM latency 166 minutes. Stage NI 6 %, N2 64 % and N3 9 % and REM sleep 21 %.    RESPIRATORY DATA:  Baseline oxygen saturation is 95 %. The lowest saturation is 74 %. The diagnostic AHI is 36. The RDI is 38. The REM AHI is 56. The patient could not be titrated as his events occurred the second half of the night.  LIMB MOVEMENT SUMMARY: PLM index 50.   ELECTROCARDIOGRAM SUMMARY: Average heart rate is 90 with no significant dysrhythmias observed.   IMPRESSION:  1. Moderately severe obstructive sleep apnea syndrome worse during REM sleep. The patient cannot be titrated during this recording as his events occurred during the second half of the night. A formal CPAP titration recording is suggested. 2. Severe periodic limb movement disorder of sleep.  Thanks for this referral.   A. Merlene Laughter,  M.D. Diplomat, Tax adviser of Sleep Medicine.

## 2014-07-11 ENCOUNTER — Ambulatory Visit (INDEPENDENT_AMBULATORY_CARE_PROVIDER_SITE_OTHER): Payer: Medicare Other | Admitting: Cardiovascular Disease

## 2014-07-11 ENCOUNTER — Encounter: Payer: Self-pay | Admitting: Cardiovascular Disease

## 2014-07-11 VITALS — BP 130/98 | HR 62 | Ht 71.0 in | Wt 248.0 lb

## 2014-07-11 DIAGNOSIS — Z713 Dietary counseling and surveillance: Secondary | ICD-10-CM

## 2014-07-11 DIAGNOSIS — Z7182 Exercise counseling: Secondary | ICD-10-CM

## 2014-07-11 DIAGNOSIS — I517 Cardiomegaly: Secondary | ICD-10-CM | POA: Diagnosis not present

## 2014-07-11 DIAGNOSIS — R5383 Other fatigue: Secondary | ICD-10-CM

## 2014-07-11 DIAGNOSIS — I77819 Aortic ectasia, unspecified site: Secondary | ICD-10-CM

## 2014-07-11 DIAGNOSIS — G4733 Obstructive sleep apnea (adult) (pediatric): Secondary | ICD-10-CM | POA: Diagnosis not present

## 2014-07-11 DIAGNOSIS — Z719 Counseling, unspecified: Secondary | ICD-10-CM

## 2014-07-11 DIAGNOSIS — I7789 Other specified disorders of arteries and arterioles: Secondary | ICD-10-CM

## 2014-07-11 DIAGNOSIS — I4891 Unspecified atrial fibrillation: Secondary | ICD-10-CM

## 2014-07-11 DIAGNOSIS — I10 Essential (primary) hypertension: Secondary | ICD-10-CM

## 2014-07-11 MED ORDER — DILTIAZEM HCL ER COATED BEADS 120 MG PO CP24: 120.0000 mg | ORAL_CAPSULE | Freq: Two times a day (BID) | ORAL | Status: DC

## 2014-07-11 MED ORDER — APIXABAN 5 MG PO TABS: 5.0000 mg | ORAL_TABLET | Freq: Two times a day (BID) | ORAL | Status: DC

## 2014-07-11 NOTE — Addendum Note (Signed)
Addended by: Barbarann Ehlers A on: 07/11/2014 03:45 PM   Modules accepted: Orders

## 2014-07-11 NOTE — Patient Instructions (Signed)
Your physician recommends that you schedule a follow-up appointment in: 3 months    Your physician has recommended you make the following change in your medication:    INCREASE Diltiazem to 120 mg twice a day  Please take Eliquis 5 mg TWICE a day  You have been referred to Pulmonary, Dr.Hawkins       Thank you for choosing [facility] !

## 2014-07-11 NOTE — Progress Notes (Signed)
Patient ID: SENDER RUEB, male   DOB: [birth year], 68 y.o.   MRN: [id number]      SUBJECTIVE: The patient returns for followup for atrial fibrillation. Echocardiography demonstrated normal left ventricular systolic function, EF 27-03%, moderate LVH, mild left atrial enlargement, mild right atrial enlargement, and mild to moderate right ventricular enlargement. Aortic root size was normal. Sleep study demonstrated moderately severe obstructive sleep apnea. He continues to feel fatigued. He has not undergone a CPAP titration study. He has only been taking Eliquis 5 mg daily because he did not read the instructions. He enjoys eating and says his wife is a good cook, but has begun the [employer] in an attempt to lose weight. He denies dizziness. He has a cough due to GERD.  Review of Systems: As per "subjective", otherwise negative.  No Known Allergies  Current Outpatient Prescriptions  Medication Sig Dispense Refill  . apixaban (ELIQUIS) 5 MG TABS tablet Take 5 mg by mouth daily.      Marland Kitchen diltiazem (CARDIZEM CD) 120 MG 24 hr capsule Take 1 capsule (120 mg total) by mouth daily.  90 capsule  3  . losartan (COZAAR) 50 MG tablet Take 1 tablet (50 mg total) by mouth daily.  90 tablet  3  . pantoprazole (PROTONIX) 40 MG tablet Take 40 mg by mouth daily.        No current facility-administered medications for this visit.    Past Medical History  Diagnosis Date  . Hypertension     No past surgical history on file.  History   Social History  . Marital Status: Married    Spouse Name: N/A    Number of Children: N/A  . Years of Education: N/A   Occupational History  . Full time    Social History Main Topics  . Smoking status: Former Smoker -- 0.50 packs/day for 15 years    Types: Cigarettes    Start date: 05/28/1967    Quit date: 05/27/1984  . Smokeless tobacco: Never Used  . Alcohol Use: Yes  . Drug Use: Not on file  . Sexual Activity: Not on file   Other Topics Concern  . Not  on file   Social History Narrative   Married   No regular exercise     Filed Vitals:   07/11/14 0848  Height: 5\' 11"  (1.803 m)  Weight: 248 lb (112.492 kg)   BP 130/98  Pulse 62 (HR by auscultation 85-95 bpm)  PHYSICAL EXAM General: NAD HEENT: Normal. Neck: No JVD, no thyromegaly. Lungs: Clear to auscultation bilaterally with normal respiratory effort. CV: Nondisplaced PMI.  Irregular rhythm, normal S1/S2, no S3, no murmur. No pretibial or periankle edema.   Abdomen: Soft, nontender, obese, no distention.  Neurologic: Alert and oriented x 3.  Psych: Normal affect. Skin: Normal. Musculoskeletal: Normal range of motion, no gross deformities. Extremities: No clubbing or cyanosis.   ECG: Most recent ECG reviewed.      ASSESSMENT AND PLAN: 1. Atrial fibrillation: Rate is 85-95 bpm. I will increase diltiazem to 120 mg bid which should also help to improve his symptoms of fatigue. I encouraged dietary and exercise modification for weight loss. Potential etiologies for atrial fibrillation include long-standing essential hypertension and moderately severe obstructive sleep apnea. TSH and free T4 were normal. CHADS-VASC score 2 (age, HTN) thus at moderate risk for CVA. For this reason, I will continue anticoagulation with Eliquis 5 mg twice daily (I reinforced proper usage of this twice daily).  2. Moderately severe  obstructive sleep apnea: Needs CPAP titration study, for which I will make a referral. This is a known etiology of atrial fibrillation as well. I warned him about other deleterious effects of untreated sleep apnea including MI. I also encouraged dietary modification and weight loss with exercise as well. 3. Aortic root dilatation: Mildly dilated in 2010 with a diameter of 41 mm, now normal by echocardiogram.  4. Essential HTN: Mildly elevated DBP. I am increasing diltiazem and will continue to monitor this. 5. RV enlargement: Mild to moderately dilated, and likely due to  untreated moderately severe obstructive sleep apnea. Weight loss and CPAP use strongly advised.   Dispo: f/u 3 months.   Kate Sable, M.D., F.A.C.C.

## 2014-07-12 ENCOUNTER — Telehealth: Payer: Self-pay | Admitting: *Deleted

## 2014-07-12 NOTE — Telephone Encounter (Signed)
PA for Eliquis 5 mg #[id number]. Pt approved may pick up medication at any time

## 2014-07-13 ENCOUNTER — Other Ambulatory Visit (HOSPITAL_COMMUNITY): Payer: Self-pay | Admitting: Respiratory Therapy

## 2014-07-13 DIAGNOSIS — G4730 Sleep apnea, unspecified: Secondary | ICD-10-CM

## 2014-07-25 ENCOUNTER — Ambulatory Visit: Payer: Medicare Other | Attending: Neurology | Admitting: Sleep Medicine

## 2014-07-25 DIAGNOSIS — G4733 Obstructive sleep apnea (adult) (pediatric): Secondary | ICD-10-CM | POA: Diagnosis not present

## 2014-07-25 DIAGNOSIS — G4761 Periodic limb movement disorder: Secondary | ICD-10-CM | POA: Diagnosis not present

## 2014-07-25 DIAGNOSIS — G4730 Sleep apnea, unspecified: Secondary | ICD-10-CM | POA: Diagnosis not present

## 2014-08-03 NOTE — Sleep Study (Signed)
  [place] A. Merlene Laughter, MD     [email or web address]        NOCTURNAL POLYSOMNOGRAM    LOCATION: SLEEP LAB FACILITY: [facility]   PHYSICIAN:  A. Merlene Laughter, M.D.   DATE OF STUDY: 07/25/2014.   REFERRING PHYSICIAN: S. Koneswaran.   INDICATIONS: The patient is a 68 year old who has had a previous sleep study documenting significant obstructive sleep apnea syndrome. This is a CPAP titration recording.  MEDICATIONS:  Prior to Admission medications   Medication Sig Start Date End Date Taking? Authorizing Provider  apixaban (ELIQUIS) 5 MG TABS tablet Take 1 tablet (5 mg total) by mouth 2 (two) times daily. 07/11/14   Herminio Commons, MD  diltiazem (CARDIZEM CD) 120 MG 24 hr capsule Take 1 capsule (120 mg total) by mouth 2 (two) times daily. 07/11/14   Herminio Commons, MD  losartan (COZAAR) 50 MG tablet Take 1 tablet (50 mg total) by mouth daily. 05/27/14   Herminio Commons, MD  pantoprazole (PROTONIX) 40 MG tablet Take 40 mg by mouth daily.  05/25/14   Historical Provider, MD      EPWORTH SLEEPINESS SCALE: not done.   BMI: 35.   ARCHITECTURAL SUMMARY: Total recording time was 512 minutes. Sleep efficiency 65 %. Sleep latency 17 minutes. REM latency 133 minutes. Stage NI 6 %, N2 70 % and N3 10 % and REM sleep 14 %.    RESPIRATORY DATA:  Baseline oxygen saturation is 95 %. The lowest saturation is 84 %. The patient was started on positive pressure is at 5 and increased to a high of 12. This was a difficult titration with a technician adjusting pressures up and down. High pressures were associated with increased central events. The optimal pressure is 6.  LIMB MOVEMENT SUMMARY: PLM index 40.   ELECTROCARDIOGRAM SUMMARY: Average heart rate is 73. Atrial fibrillation/atrial flutter is observed throughout the recording.  IMPRESSION:  1. Obstructive sleep apnea syndrome which responds well to a CPAP of 6. 2. Severe periodic limb movement disorder.   Thanks  for this referral.   A. Merlene Laughter, M.D. Diplomat, Tax adviser of Sleep Medicine.

## 2014-08-11 DIAGNOSIS — G4733 Obstructive sleep apnea (adult) (pediatric): Secondary | ICD-10-CM | POA: Diagnosis not present

## 2014-08-11 DIAGNOSIS — I10 Essential (primary) hypertension: Secondary | ICD-10-CM | POA: Diagnosis not present

## 2014-08-11 DIAGNOSIS — I482 Chronic atrial fibrillation: Secondary | ICD-10-CM | POA: Diagnosis not present

## 2014-08-23 ENCOUNTER — Telehealth: Payer: Self-pay | Admitting: Cardiovascular Disease

## 2014-08-23 DIAGNOSIS — K625 Hemorrhage of anus and rectum: Secondary | ICD-10-CM

## 2014-08-23 DIAGNOSIS — B9681 Helicobacter pylori [H. pylori] as the cause of diseases classified elsewhere: Secondary | ICD-10-CM

## 2014-08-23 HISTORY — DX: Helicobacter pylori (H. pylori) as the cause of diseases classified elsewhere: B96.81

## 2014-08-23 MED ORDER — APIXABAN 2.5 MG PO TABS: 2.5000 mg | ORAL_TABLET | Freq: Two times a day (BID) | ORAL | Status: DC

## 2014-08-23 NOTE — Telephone Encounter (Signed)
-----   Message from Herminio Commons, MD sent at 08/23/2014 10:08 AM EST ----- Regarding: RE: rectal bleeding Phone note did not get sent, but I saw in chart. Would have him reduce Eliquis to 2.5 mg bid and have him see GI.   ----- Message -----    From: Bernita Raisin, RN    Sent: 08/23/2014   8:24 AM      To: Herminio Commons, MD Subject: rectal bleeding                                Sent u phone note,noted bleeding this am

## 2014-08-23 NOTE — Telephone Encounter (Signed)
Spoke with patient,I have provided pt with 1 month of Eliquis 2.5 mg  Samples, as his insurance will charge him $416 since it is too soon to refill med, referral placed STAT to GI Dr.Fields/Dr.Rourk

## 2014-08-23 NOTE — Telephone Encounter (Signed)
Patient states he was started on Eliquis.  States that he is having some rectal bleeding. / tgs

## 2014-08-23 NOTE — Telephone Encounter (Signed)
Brite red rectal bleeding noted even without bowel movement,pt wearing pad right now,started this am,noted on bed sheets.Pt put Hemorrhoid suppository in and bleeding as slowed,denies dizziness,weakness,please advise

## 2014-08-24 ENCOUNTER — Ambulatory Visit (INDEPENDENT_AMBULATORY_CARE_PROVIDER_SITE_OTHER): Payer: Medicare Other | Admitting: Gastroenterology

## 2014-08-24 ENCOUNTER — Encounter: Payer: Self-pay | Admitting: Gastroenterology

## 2014-08-24 ENCOUNTER — Other Ambulatory Visit: Payer: Self-pay

## 2014-08-24 VITALS — BP 126/79 | HR 73 | Temp 97.2°F | Ht 71.0 in | Wt 243.6 lb

## 2014-08-24 DIAGNOSIS — K219 Gastro-esophageal reflux disease without esophagitis: Secondary | ICD-10-CM

## 2014-08-24 DIAGNOSIS — K625 Hemorrhage of anus and rectum: Secondary | ICD-10-CM | POA: Diagnosis not present

## 2014-08-24 DIAGNOSIS — K2270 Barrett's esophagus without dysplasia: Secondary | ICD-10-CM

## 2014-08-24 NOTE — Assessment & Plan Note (Signed)
ETIOLOGY UNCLEAR: RECTAL ULCER, PROCTITIS, HEMORRHOIDS, OR POLYPS, LES LIKELY CANCER  COLONOSCOPY WITH POSSIBLE HEMORRHOID BANDING ON DEC 15. DISCUSSED PROCEDURE, BENEFITS, & RISKS: < 1% chance of medication reaction, bleeding, perforation, PELVIC VEIN SEPSIS, or rupture of spleen/liver. PREPOPIK DEC 14 REGULAR BREAKFAST DEC 14 THEN CLEAR LIQUIDS AFTER 9 AM. PT MAY HAVE A CLEAR LIQUID BREAKFAST ON DEC 15. NOTHING AFTER 9 AM. [facility] TBS SCHEDULED AFTER ENDOSCOPY.

## 2014-08-24 NOTE — Assessment & Plan Note (Signed)
GERD CONTROLLED. NEVER BEEN SCREENED FOR BARRETT'S.  EGD DEC 2015 TO SCREEN FOR BARRETT'S. DISCUSSED PROCEDURE, BENEFITS, & RISKS: < 1% chance of medication reaction, OR bleeding.

## 2014-08-24 NOTE — Patient Instructions (Signed)
HOLD ELIQUIS ON DEC 13.  REGULAR BREAKFAST DEC 14 THEN CLEAR LIQUIDS AFTER 9 AM. YOU MAY HAVE A CLEAR LIQUID BREAKFAST ON DEC 15. NOTHING AFTER 9 AM.  TAKE PREPOPIK ON DEC 14.   UPPER ENDOSCOPY AND COLONOSCOPY WITH POSSIBLE HEMORRHOID BANDING ON DEC 15. DISCUSSED PROCEDURE, BENEFITS, & RISKS: < 1% chance of medication reaction, bleeding, perforation, PELVIC VEIN SEPSIS, or rupture of spleen/liver.  I WILL SCHEDULE YOUR FOLLOW UP AFTER ENDOSCOPY.

## 2014-08-24 NOTE — Progress Notes (Signed)
cc'ed to pcp °

## 2014-08-24 NOTE — Progress Notes (Addendum)
Subjective:    Patient ID: Sean Golden, male    DOB: [birth year], 68 y.o.   MRN: [id number]  HAWKINS,EDWARD L, MD  HPI Saw blood in stool yesterday. LAST TCS/EGD SEP 2005: RMR. HEARTBURN HELPED BY PILLS AND WHEY PROTEIN. SOB: JUST DEPENDS(MILD TO MODERATE). BMs: DAILY W/O STRAINING. HAS RECTAL ITCHING FAIRLY OFTEN. NO RECTAL PRESSURE OR THROBBING. USES A SUPP TO USE WHEN IT BOTHERS HIM AND IT HELPS. GLASS OF WINE: 1-2 A DAY.  PT DENIES FEVER, CHILLS,  nausea, vomiting, melena, diarrhea, SHORTNESS OF BREATH,  CHANGE IN BOWEL IN HABITS, constipation, abdominal pain, problems swallowing, problems with sedation, heartburn or indigestion.  Past Medical History  Diagnosis Date  . Hypertension   . Atrial fibrillation SEP 2015   Past Surgical History  Procedure Laterality Date  . Colonoscopy  2005    SOLITARY RECTAL ULCER  . Cardiac electrophysiology study and ablation  2010?  [facility]land Kitchen Upper gastrointestinal endoscopy  2005 RMR   No Known Allergies  Current Outpatient Prescriptions  Medication Sig Dispense Refill  . apixaban (ELIQUIS) 2.5 MG TABS tablet Take 1 tablet (2.5 mg total) by mouth 2 (two) times daily. 60 tablet 6  . diltiazem (CARDIZEM CD) 120 MG 24 hr capsule Take 1 capsule (120 mg total) by mouth 2 (two) times daily. 180 capsule 3  . losartan (COZAAR) 50 MG tablet Take 1 tablet (50 mg total) by mouth daily. 90 tablet 3  . pantoprazole (PROTONIX) 40 MG tablet Take 40 mg by mouth daily.      No current facility-administered medications for this visit.   Family History  Problem Relation Age of Onset  . Hypertension Mother   . Heart attack Father   . Hyperlipidemia Sister   . Hyperlipidemia Brother   . Colon polyps Brother   . Hypertension Brother   . Hypertension Sister   . Diabetes Brother   . Stroke Other   . Diabetes Other   . Hypertension Other   . Hyperlipidemia Other   . Colon cancer Neg Hx    History   Social History  . Marital Status: Married    Spouse  Name: N/A    Number of Children: N/A  . Years of Education: N/A   Occupational History  . Full time [employer]   Social History Main Topics  . Smoking status: Former Smoker -- 0.50 packs/day for 15 years    Types: Cigarettes    Start date: 05/28/1967    Quit date: 05/27/1984  . Smokeless tobacco: Never Used  . Alcohol Use: Yes  . Drug Use: Not on file  . Sexual Activity: Not on file   Social History Narrative   Married-2 KIDS(AGE 54 AND 38).   No regular exercise   Review of Systems PER HPI OTHERWISE ALL SYSTEMS ARE NEGATIVE.    Objective:   Physical Exam  Constitutional: He is oriented to person, place, and time. He appears well-developed and well-nourished. No distress.  HENT:  Head: Normocephalic and atraumatic.  Mouth/Throat: Oropharynx is clear and moist. No oropharyngeal exudate.  Eyes: Pupils are equal, round, and reactive to light. No scleral icterus.  Neck: Normal range of motion. Neck supple.  Cardiovascular: Normal rate, regular rhythm and normal heart sounds.   Pulmonary/Chest: Effort normal and breath sounds normal. No respiratory distress.  Abdominal: Soft. Bowel sounds are normal. He exhibits no distension. There is no tenderness.  Musculoskeletal: He exhibits no edema.  Lymphadenopathy:    He has no cervical adenopathy.  Neurological: He is alert and oriented to person, place, and time.  NO FOCAL DEFICITS   Psychiatric: He has a normal mood and affect.  Vitals reviewed.         Assessment & Plan:

## 2014-09-06 ENCOUNTER — Encounter (HOSPITAL_COMMUNITY)
Admission: RE | Disposition: A | Discharge status: Home / Self Care | Payer: Self-pay | Source: Ambulatory Visit | Attending: Gastroenterology

## 2014-09-06 ENCOUNTER — Encounter (HOSPITAL_COMMUNITY): Payer: Self-pay

## 2014-09-06 ENCOUNTER — Ambulatory Visit (HOSPITAL_COMMUNITY)
Admission: RE | Admit: 2014-09-06 | Discharge: 2014-09-06 | Disposition: A | Discharge status: Home / Self Care | Payer: Medicare Other | Source: Ambulatory Visit | Attending: Gastroenterology | Admitting: Gastroenterology

## 2014-09-06 DIAGNOSIS — I4892 Unspecified atrial flutter: Secondary | ICD-10-CM | POA: Insufficient documentation

## 2014-09-06 DIAGNOSIS — B9681 Helicobacter pylori [H. pylori] as the cause of diseases classified elsewhere: Secondary | ICD-10-CM | POA: Insufficient documentation

## 2014-09-06 DIAGNOSIS — K625 Hemorrhage of anus and rectum: Secondary | ICD-10-CM

## 2014-09-06 DIAGNOSIS — K2970 Gastritis, unspecified, without bleeding: Secondary | ICD-10-CM | POA: Diagnosis not present

## 2014-09-06 DIAGNOSIS — L290 Pruritus ani: Secondary | ICD-10-CM | POA: Diagnosis not present

## 2014-09-06 DIAGNOSIS — G4730 Sleep apnea, unspecified: Secondary | ICD-10-CM | POA: Diagnosis not present

## 2014-09-06 DIAGNOSIS — K5730 Diverticulosis of large intestine without perforation or abscess without bleeding: Secondary | ICD-10-CM | POA: Diagnosis not present

## 2014-09-06 DIAGNOSIS — D120 Benign neoplasm of cecum: Secondary | ICD-10-CM | POA: Diagnosis not present

## 2014-09-06 DIAGNOSIS — K2270 Barrett's esophagus without dysplasia: Secondary | ICD-10-CM

## 2014-09-06 DIAGNOSIS — K2900 Acute gastritis without bleeding: Secondary | ICD-10-CM | POA: Insufficient documentation

## 2014-09-06 DIAGNOSIS — Z87891 Personal history of nicotine dependence: Secondary | ICD-10-CM | POA: Insufficient documentation

## 2014-09-06 DIAGNOSIS — K2950 Unspecified chronic gastritis without bleeding: Secondary | ICD-10-CM | POA: Insufficient documentation

## 2014-09-06 DIAGNOSIS — D123 Benign neoplasm of transverse colon: Secondary | ICD-10-CM | POA: Insufficient documentation

## 2014-09-06 DIAGNOSIS — I4891 Unspecified atrial fibrillation: Secondary | ICD-10-CM | POA: Diagnosis not present

## 2014-09-06 DIAGNOSIS — K219 Gastro-esophageal reflux disease without esophagitis: Secondary | ICD-10-CM | POA: Diagnosis not present

## 2014-09-06 DIAGNOSIS — I10 Essential (primary) hypertension: Secondary | ICD-10-CM | POA: Insufficient documentation

## 2014-09-06 HISTORY — PX: HEMORRHOID BANDING: SHX5850

## 2014-09-06 HISTORY — DX: Sleep apnea, unspecified: G47.30

## 2014-09-06 HISTORY — DX: Reserved for inherently not codable concepts without codable children: IMO0001

## 2014-09-06 HISTORY — PX: COLONOSCOPY: SHX5424

## 2014-09-06 HISTORY — PX: ESOPHAGOGASTRODUODENOSCOPY: SHX5428

## 2014-09-06 SURGERY — COLONOSCOPY
Anesthesia: Moderate Sedation

## 2014-09-06 MED ORDER — STERILE WATER FOR IRRIGATION IR SOLN
Status: DC | PRN
  Administered 2014-09-06: 14:00:00

## 2014-09-06 MED ORDER — CLOTRIMAZOLE 1 % EX CREA: TOPICAL_CREAM | CUTANEOUS | Status: DC

## 2014-09-06 MED ORDER — MIDAZOLAM HCL 5 MG/5ML IJ SOLN
INTRAMUSCULAR | Status: AC
  Filled 2014-09-06: qty 10

## 2014-09-06 MED ORDER — SODIUM CHLORIDE 0.9 % IV SOLN
INTRAVENOUS | Status: DC
  Administered 2014-09-06: 14:00:00 via INTRAVENOUS

## 2014-09-06 MED ORDER — MEPERIDINE HCL 100 MG/ML IJ SOLN
INTRAMUSCULAR | Status: DC | PRN
  Administered 2014-09-06 (×2): 25 mg via INTRAVENOUS

## 2014-09-06 MED ORDER — MEPERIDINE HCL 100 MG/ML IJ SOLN
INTRAMUSCULAR | Status: AC
  Filled 2014-09-06: qty 2

## 2014-09-06 MED ORDER — MIDAZOLAM HCL 5 MG/5ML IJ SOLN
INTRAMUSCULAR | Status: DC | PRN
  Administered 2014-09-06 (×2): 1 mg via INTRAVENOUS
  Administered 2014-09-06: 2 mg via INTRAVENOUS
  Administered 2014-09-06 (×2): 1 mg via INTRAVENOUS

## 2014-09-06 MED ORDER — LIDOCAINE VISCOUS 2 % MT SOLN
OROMUCOSAL | Status: AC
  Filled 2014-09-06: qty 15

## 2014-09-06 NOTE — H&P (View-Only) (Signed)
Subjective:    Patient ID: Sean Golden, male    DOB: 1945/11/12, 68 y.o.   MRN: [id number]  HAWKINS,EDWARD L, MD  HPI Saw blood in stool yesterday. LAST TCS/EGD SEP 2005: RMR. HEARTBURN HELPED BY PILLS AND WHEY PROTEIN. SOB: JSUT DEPENDS(MILD TO MODERATE). BMs: DAILY W/O STRAINING. HAS RECTAL ITCHING FAIRLY OFTEN. NO RECTAL PRESSURE OR THROBBING. USES A SUPP TO USE WHEN IT BOTHERS HIM AND IT HELPS. GLASS OF WINE: 1-2 A DAY.  PT DENIES FEVER, CHILLS,  nausea, vomiting, melena, diarrhea, SHORTNESS OF BREATH,  CHANGE IN BOWEL IN HABITS, constipation, abdominal pain, problems swallowing, problems with sedation, heartburn or indigestion.  Past Medical History  Diagnosis Date  . Hypertension   . Atrial fibrillation SEP 2015    Past Surgical History  Procedure Laterality Date  . Colonoscopy  2005    SOLITARY RECTAL ULCER  . Cardiac electrophysiology study and ablation  2010?  Marland Kitchen Upper gastrointestinal endoscopy  2005 RMR   No Known Allergies  Current Outpatient Prescriptions  Medication Sig Dispense Refill  . apixaban (ELIQUIS) 2.5 MG TABS tablet Take 1 tablet (2.5 mg total) by mouth 2 (two) times daily. 60 tablet 6  . diltiazem (CARDIZEM CD) 120 MG 24 hr capsule Take 1 capsule (120 mg total) by mouth 2 (two) times daily. 180 capsule 3  . losartan (COZAAR) 50 MG tablet Take 1 tablet (50 mg total) by mouth daily. 90 tablet 3  . pantoprazole (PROTONIX) 40 MG tablet Take 40 mg by mouth daily.      No current facility-administered medications for this visit.   Family History  Problem Relation Age of Onset  . Hypertension Mother   . Heart attack Father   . Hyperlipidemia Sister   . Hyperlipidemia Brother   . Colon polyps Brother   . Hypertension Brother   . Hypertension Sister   . Diabetes Brother   . Stroke Other   . Diabetes Other   . Hypertension Other   . Hyperlipidemia Other   . Colon cancer Neg Hx    History   Social History  . Marital Status: Married    Spouse  Name: N/A    Number of Children: N/A  . Years of Education: N/A   Occupational History  . Full time [employer]   Social History Main Topics  . Smoking status: Former Smoker -- 0.50 packs/day for 15 years    Types: Cigarettes    Start date: 05/28/1967    Quit date: 05/27/1984  . Smokeless tobacco: Never Used  . Alcohol Use: Yes  . Drug Use: Not on file  . Sexual Activity: Not on file   Social History Narrative   Married-2 KIDS(AGE 77 AND 38).   No regular exercise   Review of Systems PER HPI OTHERWISE ALL SYSTEMS ARE NEGATIVE.    Objective:   Physical Exam  Constitutional: He is oriented to person, place, and time. He appears well-developed and well-nourished. No distress.  HENT:  Head: Normocephalic and atraumatic.  Mouth/Throat: Oropharynx is clear and moist. No oropharyngeal exudate.  Eyes: Pupils are equal, round, and reactive to light. No scleral icterus.  Neck: Normal range of motion. Neck supple.  Cardiovascular: Normal rate, regular rhythm and normal heart sounds.   Pulmonary/Chest: Effort normal and breath sounds normal. No respiratory distress.  Abdominal: Soft. Bowel sounds are normal. He exhibits no distension. There is no tenderness.  Musculoskeletal: He exhibits no edema.  Lymphadenopathy:    He has no cervical adenopathy.  Neurological: He is alert and oriented to person, place, and time.  NO FOCAL DEFICITS   Psychiatric: He has a normal mood and affect.  Vitals reviewed.         Assessment & Plan:

## 2014-09-06 NOTE — Interval H&P Note (Signed)
History and Physical Interval Note:  09/06/2014 1:49 PM  Sean Golden  has presented today for surgery, with the diagnosis of Barrett's screening/rectal bleeding  The various methods of treatment have been discussed with the patient and family. After consideration of risks, benefits and other options for treatment, the patient has consented to  Procedure(s) with comments: COLONOSCOPY (N/A) - 200 ESOPHAGOGASTRODUODENOSCOPY (EGD) (N/A) HEMORRHOID BANDING (N/A) as a surgical intervention .  The patient's history has been reviewed, patient examined, no change in status, stable for surgery.  I have reviewed the patient's chart and labs.  Questions were answered to the patient's satisfaction.     [employer]

## 2014-09-06 NOTE — Discharge Instructions (Signed)
YOUR RECTAL BLEEDING MAY BE DUE TO SMALL INTERNAL HEMORRHOIDS, DIVERTICULOSIS. POLYPS, OR A SKIN INFECTION IN YOUR ANAL AREA. YOUR RECTAL ITCHING MAY BE DUE TO EXTERNAL HEMORRHOIDS ,OR A SKIN INFECTION IN YOUR ANAL AREA.  You had 3 polyps removed FROM YOUR colon. You have EXTERNAL HEMORRHOIDS & DIVERTICULOSIS IN YOUR LEFT AND RIGHT COLON. You have MODERATE gastritis. YOUR SMALL BOWEL IS NORMAL. I biopsied your stomach.    HOLD ELIQUIS. RE-START ON DEC 23.  USE CLOTRIMAZOLE THREE TIMES A DAY. APPLY TO SKIN AROUND YOUR ANUS.  CONTINUE YOUR WEIGHT LOSS EFFORTS. LOSE 10 MORE LBS.  DRINK WATER TO KEEP YOUR URINE LIGHT YELLOW.  CONTINUE PROTONIX. TAKE 30 MINUTES PRIOR TO BREAKFAST.  AVOID ITEMS THAT TRIGGER GASTRITIS. SEE INFO BELOW.  FOLLOW A HIGH FIBER/LOW FAT DIET. AVOID ITEMS THAT CAUSE BLOATING. SEE INFO BELOW.  YOUR BIOPSY WILL BE BACK IN 14 DAYS OR YOU CAN LOOK THEM UP ON MY CHART AFTER DEC 17.  PLEASE CALL IF YOUR RECTAL ITCHING IS NOT RESOLVED  AFTER Oct 07, 2014. YOU MAY NEED TO SEE DERMATOLOGY.  FOLLOW UP IN 3 MOS.   Next colonoscopy in 3-5 years.     ENDOSCOPY Care After Read the instructions outlined below and refer to this sheet in the next week. These discharge instructions provide you with general information on caring for yourself after you leave the hospital. While your treatment has been planned according to the most current medical practices available, unavoidable complications occasionally occur. If you have any problems or questions after discharge, call DR. , [phone].  ACTIVITY  You may resume your regular activity, but move at a slower pace for the next 24 hours.   Take frequent rest periods for the next 24 hours.   Walking will help get rid of the air and reduce the bloated feeling in your belly (abdomen).   No driving for 24 hours (because of the medicine (anesthesia) used during the test).   You may shower.   Do not sign any important legal  documents or operate any machinery for 24 hours (because of the anesthesia used during the test).    NUTRITION  Drink plenty of fluids.   You may resume your normal diet as instructed by your doctor.   Begin with a light meal and progress to your normal diet. Heavy or fried foods are harder to digest and may make you feel sick to your stomach (nauseated).   Avoid alcoholic beverages for 24 hours or as instructed.    MEDICATIONS  You may resume your normal medications.   WHAT YOU CAN EXPECT TODAY  Some feelings of bloating in the abdomen.   Passage of more gas than usual.   Spotting of blood in your stool or on the toilet paper  .  IF YOU HAD POLYPS REMOVED DURING THE ENDOSCOPY:  Eat a soft diet IF YOU HAVE NAUSEA, BLOATING, ABDOMINAL PAIN, OR VOMITING.    FINDING OUT THE RESULTS OF YOUR TEST Not all test results are available during your visit. DR. Oneida Alar WILL CALL YOU WITHIN 14 DAYS OF YOUR PROCEDUE WITH YOUR RESULTS. Do not assume everything is normal if you have not heard from DR. , CALL HER OFFICE AT [phone].  SEEK IMMEDIATE MEDICAL ATTENTION AND CALL THE OFFICE: [phone] IF:  You have more than a spotting of blood in your stool.   Your belly is swollen (abdominal distention).   You are nauseated or vomiting.   You have a temperature over 101F.  You have abdominal pain or discomfort that is severe or gets worse throughout the day.   Gastritis/DUODENITIS  Gastritis/DUODENITIS is inflammation (the body's way of reacting to injury and/or infection) of the stomach/SMALLBOWEL. It is often caused by viral or bacterial (germ) infections. It can also be caused BY ASPIRIN, BC/GOODY POWDER'S, (IBUPROFEN) MOTRIN, OR ALEVE (NAPROXEN), chemicals (including alcohol), SPICY FOODS, and medications. This illness may be associated with generalized malaise (feeling tired, not well), UPPER ABDOMINAL STOMACH cramps, and fever. One common bacterial cause of gastritis  is an organism known as H. Pylori. This can be treated with antibiotics.    High-Fiber Diet A high-fiber diet changes your normal diet to include more whole grains, legumes, fruits, and vegetables. Changes in the diet involve replacing refined carbohydrates with unrefined foods. The calorie level of the diet is essentially unchanged. The Dietary Reference Intake (recommended amount) for adult males is 38 grams per day. For adult females, it is 25 grams per day. Pregnant and lactating women should consume 28 grams of fiber per day. Fiber is the intact part of a plant that is not broken down during digestion. Functional fiber is fiber that has been isolated from the plant to provide a beneficial effect in the body. PURPOSE  Increase stool bulk.   Ease and regulate bowel movements.   Lower cholesterol.  INDICATIONS THAT YOU NEED MORE FIBER  Constipation and hemorrhoids.   Uncomplicated diverticulosis (intestine condition) and irritable bowel syndrome.   Weight management.   As a protective measure against hardening of the arteries (atherosclerosis), diabetes, and cancer.   GUIDELINES FOR INCREASING FIBER IN THE DIET  Start adding fiber to the diet slowly. A gradual increase of about 5 more grams (2 slices of whole-wheat bread, 2 servings of most fruits or vegetables, or 1 bowl of high-fiber cereal) per day is best. Too rapid an increase in fiber may result in constipation, flatulence, and bloating.   Drink enough water and fluids to keep your urine clear or pale yellow. Water, juice, or caffeine-free drinks are recommended. Not drinking enough fluid may cause constipation.   Eat a variety of high-fiber foods rather than one type of fiber.   Try to increase your intake of fiber through using high-fiber foods rather than fiber pills or supplements that contain small amounts of fiber.   The goal is to change the types of food eaten. Do not supplement your present diet with high-fiber  foods, but replace foods in your present diet.  INCLUDE A VARIETY OF FIBER SOURCES  Replace refined and processed grains with whole grains, canned fruits with fresh fruits, and incorporate other fiber sources. White rice, white breads, and most bakery goods contain little or no fiber.   Brown whole-grain rice, buckwheat oats, and many fruits and vegetables are all good sources of fiber. These include: broccoli, Brussels sprouts, cabbage, cauliflower, beets, sweet potatoes, white potatoes (skin on), carrots, tomatoes, eggplant, squash, berries, fresh fruits, and dried fruits.   Cereals appear to be the richest source of fiber. Cereal fiber is found in whole grains and bran. Bran is the fiber-rich outer coat of cereal grain, which is largely removed in refining. In whole-grain cereals, the bran remains. In breakfast cereals, the largest amount of fiber is found in those with "bran" in their names. The fiber content is sometimes indicated on the label.   You may need to include additional fruits and vegetables each day.   In baking, for 1 cup white flour, you may use the  following substitutions:   1 cup whole-wheat flour minus 2 tablespoons.   1/2 cup white flour plus 1/2 cup whole-wheat flour.   Low-Fat Diet BREADS, CEREALS, PASTA, RICE, DRIED PEAS, AND BEANS These products are high in carbohydrates and most are low in fat. Therefore, they can be increased in the diet as substitutes for fatty foods. They too, however, contain calories and should not be eaten in excess. Cereals can be eaten for snacks as well as for breakfast.  Include foods that contain fiber (fruits, vegetables, whole grains, and legumes). Research shows that fiber may lower blood cholesterol levels, especially the water-soluble fiber found in fruits, vegetables, oat products, and legumes. FRUITS AND VEGETABLES It is good to eat fruits and vegetables. Besides being sources of fiber, both are rich in vitamins and some minerals.  They help you get the daily allowances of these nutrients. Fruits and vegetables can be used for snacks and desserts. MEATS Limit lean meat, chicken, [place], and fish to no more than 6 ounces per day. Beef, Pork, and Lamb Use lean cuts of beef, pork, and lamb. Lean cuts include:  Extra-lean ground beef.  Arm roast.  Sirloin tip.  Center-cut ham.  Round steak.  Loin chops.  Rump roast.  Tenderloin.  Trim all fat off the outside of meats before cooking. It is not necessary to severely decrease the intake of red meat, but lean choices should be made. Lean meat is rich in protein and contains a highly absorbable form of iron. Premenopausal women, in particular, should avoid reducing lean red meat because this could increase the risk for low red blood cells (iron-deficiency anemia). The organ meats, such as liver, sweetbreads, kidneys, and brain are very rich in cholesterol. They should be limited. Chicken and [place] These are good sources of protein. The fat of poultry can be reduced by removing the skin and underlying fat layers before cooking. Chicken and [place] can be substituted for lean red meat in the diet. Poultry should not be fried or covered with high-fat sauces. Fish and Shellfish Fish is a good source of protein. Shellfish contain cholesterol, but they usually are low in saturated fatty acids. The preparation of fish is important. Like chicken and [place], they should not be fried or covered with high-fat sauces. EGGS Egg whites contain no fat or cholesterol. They can be eaten often. Try 1 to 2 egg whites instead of whole eggs in recipes or use egg substitutes that do not contain yolk. MILK AND DAIRY PRODUCTS Use skim or 1% milk instead of 2% or whole milk. Decrease whole milk, natural, and processed cheeses. Use nonfat or low-fat (2%) cottage cheese or low-fat cheeses made from vegetable oils. Choose nonfat or low-fat (1 to 2%) yogurt. Experiment with evaporated skim milk in recipes  that call for heavy cream. Substitute low-fat yogurt or low-fat cottage cheese for sour cream in dips and salad dressings. Have at least 2 servings of low-fat dairy products, such as 2 glasses of skim (or 1%) milk each day to help get your daily calcium intake.  FATS AND OILS Reduce the total intake of fats, especially saturated fat. Butterfat, lard, and beef fats are high in saturated fat and cholesterol. These should be avoided as much as possible. Vegetable fats do not contain cholesterol, but certain vegetable fats, such as coconut oil, palm oil, and palm kernel oil are very high in saturated fats. These should be limited. These fats are often used in [employer], processed foods, popcorn, oils, and  nondairy creamers. Vegetable shortenings and some peanut butters contain hydrogenated oils, which are also saturated fats. Read the labels on these foods and check for saturated vegetable oils. Unsaturated vegetable oils and fats do not raise blood cholesterol. However, they should be limited because they are fats and are high in calories. Total fat should still be limited to 30% of your daily caloric intake. Desirable liquid vegetable oils are corn oil, cottonseed oil, olive oil, canola oil, safflower oil, soybean oil, and sunflower oil. Peanut oil is not as good, but small amounts are acceptable. Buy a heart-healthy tub margarine that has no partially hydrogenated oils in the ingredients. Mayonnaise and salad dressings often are made from unsaturated fats, but they should also be limited because of their high calorie and fat content. Seeds, nuts, peanut butter, olives, and avocados are high in fat, but the fat is mainly the unsaturated type. These foods should be limited mainly to avoid excess calories and fat. OTHER EATING TIPS Snacks  Most sweets should be limited as snacks. They tend to be rich in calories and fats, and their caloric content outweighs their nutritional value. Some good choices in snacks  are graham crackers, melba toast, soda crackers, bagels (no egg), English muffins, fruits, and vegetables. These snacks are preferable to snack crackers, [place] fries, and chips. Popcorn should be air-popped or cooked in small amounts of liquid vegetable oil. Desserts Eat fruit, low-fat yogurt, and fruit ices. AVOID pastries, cake, and cookies. Sherbet, angel food cake, gelatin dessert, frozen low-fat yogurt, or other frozen products that do not contain saturated fat (pure fruit juice bars, frozen ice pops) are also acceptable.  COOKING METHODS Choose those methods that use little or no fat. They include: Poaching.  Braising.  Steaming.  Grilling.  Baking.  Stir-frying.  Broiling.  Microwaving.  Foods can be cooked in a nonstick pan without added fat, or use a nonfat cooking spray in regular cookware. Limit fried foods and avoid frying in saturated fat. Add moisture to lean meats by using water, broth, cooking wines, and other nonfat or low-fat sauces along with the cooking methods mentioned above. Soups and stews should be chilled after cooking. The fat that forms on top after a few hours in the refrigerator should be skimmed off. When preparing meals, avoid using excess salt. Salt can contribute to raising blood pressure in some people. EATING AWAY FROM HOME Order entres, potatoes, and vegetables without sauces or butter. When meat exceeds the size of a deck of cards (3 to 4 ounces), the rest can be taken home for another meal. Choose vegetable or fruit salads and ask for low-calorie salad dressings to be served on the side. Use dressings sparingly. Limit high-fat toppings, such as bacon, crumbled eggs, cheese, sunflower seeds, and olives. Ask for heart-healthy tub margarine instead of butter.   Diverticulosis Diverticulosis is a common condition that develops when small pouches (diverticula) form in the wall of the colon. The risk of diverticulosis increases with age. It happens more often  in people who eat a low-fiber diet. Most individuals with diverticulosis have no symptoms. Those individuals with symptoms usually experience belly (abdominal) pain, constipation, or loose stools (diarrhea).  HOME CARE INSTRUCTIONS  Increase the amount of fiber in your diet as directed by your caregiver or dietician. This may reduce symptoms of diverticulosis.   Drink at least 6 to 8 glasses of water each day to prevent constipation.   Try not to strain when you have a bowel movement.  Avoiding nuts and seeds to prevent complications is still an uncertain benefit.       FOODS HAVING HIGH FIBER CONTENT INCLUDE:  Fruits. Apple, peach, pear, tangerine, raisins, prunes.   Vegetables. Brussels sprouts, asparagus, broccoli, cabbage, carrot, cauliflower, romaine lettuce, spinach, summer squash, tomato, winter squash, zucchini.   Starchy Vegetables. Baked beans, kidney beans, lima beans, split peas, lentils, potatoes (with skin).   Grains. Whole wheat bread, brown rice, bran flake cereal, plain oatmeal, white rice, shredded wheat, bran muffins.   Polyps, Colon  A polyp is extra tissue that grows inside your body. Colon polyps grow in the large intestine. The large intestine, also called the colon, is part of your digestive system. It is a long, hollow tube at the end of your digestive tract where your body makes and stores stool. Most polyps are not dangerous. They are benign. This means they are not cancerous. But over time, some types of polyps can turn into cancer. Polyps that are smaller than a pea are usually not harmful. But larger polyps could someday become or may already be cancerous. To be safe, doctors remove all polyps and test them.   WHO GETS POLYPS? Anyone can get polyps, but certain people are more likely than others. You may have a greater chance of getting polyps if:  You are over 50.   You have had polyps before.   Someone in your family has had polyps.   Someone in  your family has had cancer of the large intestine.   Find out if someone in your family has had polyps. You may also be more likely to get polyps if you:   Eat a lot of fatty foods   Smoke   Drink alcohol   Do not exercise  Eat too much   TREATMENT  The caregiver will remove the polyp during sigmoidoscopy or colonoscopy.  PREVENTION There is not one sure way to prevent polyps. You might be able to lower your risk of getting them if you:  Eat more fruits and vegetables and less fatty food.   Do not smoke.   Avoid alcohol.   Exercise every day.   Lose weight if you are overweight.   Eating more calcium and folate can also lower your risk of getting polyps. Some foods that are rich in calcium are milk, cheese, and broccoli. Some foods that are rich in folate are chickpeas, kidney beans, and spinach.

## 2014-09-07 NOTE — Op Note (Signed)
[facility] [address] [place], [address]   COLONOSCOPY PROCEDURE REPORT  PATIENT: Sean Golden, Sean Golden  MR#: [id number] BIRTHDATE: 08-03-1946 , 68  yrs. old GENDER: male ENDOSCOPIST: Barney Drain, MD REFERRED BY: PROCEDURE DATE:  09/06/2014 PROCEDURE:   Colonoscopy with snare polypectomy and Colonoscopy with cold biopsy polypectomy INDICATIONS:rectal discomfort and bleedingX 1. MEDICATIONS: Demerol 50 mg IV and Versed 4 mg IV  DESCRIPTION OF PROCEDURE:    Physical exam was performed.  Informed consent was obtained from the patient after explaining the benefits, risks, and alternatives to procedure.  The patient was connected to monitor and placed in left lateral position. Continuous oxygen was provided by nasal cannula and IV medicine administered through an indwelling cannula.  After administration of sedation and rectal exam, the patients rectum was intubated and the EC-3890Li ([id number])  colonoscope was advanced under direct visualization to the cecum.  The scope was removed slowly by carefully examining the color, texture, anatomy, and integrity mucosa on the way out.  The patient was recovered in endoscopy and discharged home in satisfactory condition.    COLON FINDINGS: Two sessile polyps ranging from 3 to 3mm in size were found in the distal transverse colon and at the cecum.  A polypectomy was performed with cold forceps.  , A sessile polyp measuring 6 mm in size was found at the cecum.  A polypectomy was performed using snare cautery.  , There was moderate diverticulosis noted throughout the entire examined colon with associated muscular hypertrophy and tortuosity.  , and Moderate sized hemorrhoids were found.  PREP QUALITY: good.  CECAL W/D TIME: 21 mins          COMPLICATIONS: None  ENDOSCOPIC IMPRESSION: 1.   THREE COLON polyps REMOVED 2.   RECTAL ITCHING/BLEEDING DUE TO PERI-ANAL SKIN DISEASE. NO INTERNAL HMEORRHOIDS IDENTIFIED. 3.   Moderate  diverticulosis Mthroughout the entire examined colon 4.   RECTAL DISCOMFORT MOST LIKELY DUE TO EXTERNALHEMORRHOIDS  RECOMMENDATIONS: HOLD ELIQUIS.  RE-START ON DEC 23. USE CLOTRIMAZOLE THREE TIMES A DAY.  APPLY TO SKIN AROUND YOUR ANUS. CONTINUE YOUR WEIGHT LOSS EFFORTS. DRINK WATER TO KEEP YOUR URINE LIGHT YELLOW. CONTINUE PROTONIX.  TAKE 30 MINUTES PRIOR TO BREAKFAST. AVOID ITEMS THAT TRIGGER GASTRITIS. FOLLOW A HIGH FIBER/LOW FAT DIET.  AVOID ITEMS THAT CAUSE BLOATING. Await biopsy CALL in one mo if F YOUR RECTAL ITCHING NOT RESOLVED.  CONSIDER DERMATOLOGY REFERRAL. FOLLOW UP IN 3 MOS. Next colonoscopy in 3-5 years.  ______________________________eSigned:  Barney Drain, MD October 05, 2014 5:10 PM CPT CODES: ICD CODES:  The ICD and CPT codes recommended by this software are interpretations from the data that the clinical staff has captured with the software.  The verification of the translation of this report to the ICD and CPT codes and modifiers is the sole responsibility of the health care institution and practicing physician where this report was generated.  [employer]. will not be held responsible for the validity of the ICD and CPT codes included on this report.  AMA assumes no liability for data contained or not contained herein. CPT is a Designer, television/film set of the [employer].

## 2014-09-07 NOTE — Op Note (Signed)
[facility] [address] [place], [address]   ENDOSCOPY PROCEDURE REPORT  PATIENT: Sean, Golden  MR#: [id number] BIRTHDATE: 1946/07/08 , 35  yrs. old GENDER: male  ENDOSCOPIST: Barney Drain, MD REFERRED YW:[id number] Luan Pulling, M.D. PROCEDURE DATE: 09/06/2014 PROCEDURE:   EGD w/ biopsy  INDICATIONS:screening for Barrett's. MEDICATIONS: Demerol 25 mg IV and Versed 3 mg IV TOPICAL ANESTHETIC:   Viscous Xylocaine ASA CLASS:  DESCRIPTION OF PROCEDURE:     Physical exam was performed.  Informed consent was obtained from the patient after explaining the benefits, risks, and alternatives to the procedure.  The patient was connected to the monitor and placed in the left lateral position.  Continuous oxygen was provided by nasal cannula and IV medicine administered through an indwelling cannula.  After administration of sedation, the patients esophagus was intubated and the EC-3890Li ([id number])  endoscope was advanced under direct visualization to the second portion of the duodenum.  The scope was removed slowly by carefully examining the color, texture, anatomy, and integrity of the mucosa on the way out.  The patient was recovered in endoscopy and discharged home in satisfactory condition.   ESOPHAGUS: The mucosa of the esophagus appeared normal.  STOMACH: Moderate erosive gastritis (inflammation) was found in the gastric body.  Multiple biopsies were performed using cold forceps. DUODENUM: The duodenal mucosa showed no abnormalities in the bulb and 2nd part of the duodenum. COMPLICATIONS: There were no immediate complications.  ENDOSCOPIC IMPRESSION: 1.   NO BARRETT'S ESOPHAGUS 2.   MODERATE Erosive gastritis  RECOMMENDATIONS: HOLD ELIQUIS.  RE-START ON DEC 23. USE CLOTRIMAZOLE THREE TIMES A DAY.  APPLY TO SKIN AROUND YOUR ANUS. CONTINUE YOUR WEIGHT LOSS EFFORTS. DRINK WATER TO KEEP YOUR URINE LIGHT YELLOW. CONTINUE PROTONIX.  TAKE 30 MINUTES PRIOR TO  BREAKFAST. AVOID ITEMS THAT TRIGGER GASTRITIS. FOLLOW A HIGH FIBER/LOW FAT DIET.  AVOID ITEMS THAT CAUSE BLOATING. Await biopsy CALL in one mo if F YOUR RECTAL ITCHING NOT RESOLVED.  CONSIDER DERMATOLOGY REFERRAL. FOLLOW UP IN 3 MOS. Next colonoscopy in 3-5 years.  REPEAT EXAM:    _______________________________ eSignedBarney Drain, MD 27-Sep-2014 5:13 PM CPT CODES: ICD CODES:  The ICD and CPT codes recommended by this software are interpretations from the data that the clinical staff has captured with the software.  The verification of the translation of this report to the ICD and CPT codes and modifiers is the sole responsibility of the health care institution and practicing physician where this report was generated.  [employer] will not be held responsible for the validity of the ICD and CPT codes included on this report.  AMA assumes no liability for data contained or not contained herein. CPT is a Designer, television/film set of the [employer].

## 2014-09-13 ENCOUNTER — Encounter (HOSPITAL_COMMUNITY): Payer: Self-pay | Admitting: Gastroenterology

## 2014-09-26 ENCOUNTER — Telehealth: Payer: Self-pay | Admitting: Gastroenterology

## 2014-09-26 MED ORDER — PANTOPRAZOLE SODIUM 40 MG PO TBEC: DELAYED_RELEASE_TABLET | ORAL | Status: DC

## 2014-09-26 MED ORDER — BIS SUBCIT-METRONID-TETRACYC 140-125-125 MG PO CAPS: ORAL_CAPSULE | ORAL | Status: DC

## 2014-09-26 NOTE — Telephone Encounter (Signed)
Tried to call. Many rings and no answer.  

## 2014-09-26 NOTE — Telephone Encounter (Signed)
PLEASE CALL PT. He has H. Pylori gastritis. HE  IS TAKING ELIQUIS AND DILTIAZEM and so HE needs PYLERA 3 PILLS QID FOR 10 DAYS. HE SHOULD TAKE PROTONIX BID for 3 MOS then once daily. The meds can cause nausea, vomiting, abd cramps, loose stools, black colored stools, and metallic taste in HIS mouth.   HE had TWO simple adenomas removed. Next colonoscopy in 5 years.   CONTINUE YOUR WEIGHT LOSS EFFORTS. LOSE 10 MORE LBS.  DRINK WATER TO KEEP YOUR URINE LIGHT YELLOW.  FOLLOW A HIGH FIBER/LOW FAT DIET. AVOID ITEMS THAT CAUSE BLOATING.   FOLLOW UP IN 4 MOS E30 RECTAL ITCHING/H PYLORI GASTRITIS.

## 2014-09-26 NOTE — Telephone Encounter (Signed)
APPT MADE AND ON RECALL LIST FOR TCS

## 2014-09-27 NOTE — Telephone Encounter (Signed)
Pt is aware.  

## 2014-09-28 ENCOUNTER — Telehealth: Payer: Self-pay

## 2014-09-28 NOTE — Telephone Encounter (Signed)
T/C from [place] at [facility] [phone] ) she said she was getting an interaction for the Pylera with the Eloquis. Please advise!

## 2014-09-28 NOTE — Telephone Encounter (Signed)
JAN 4 MEDICATION INTERACTION REVIEWED WITH OUR PHARMACIST: BENNY. NO INTERACTIONS NOTED.  1312 CALLED TO SPEAK TO TAMMY. STATED HER DB SAID DON'T USE TOGETHER. [place] REVIEWED AGAIN WITH MICHELE. STATED OK TO USE ELOQUIS AND PYLERA @ TO OUR DB.

## 2014-10-06 ENCOUNTER — Telehealth: Payer: Self-pay | Admitting: *Deleted

## 2014-10-06 NOTE — Telephone Encounter (Signed)
Patient came into office and requested samples of Eliquis 2.5 mg to last until 10/19/14. Patient given 2 boxes.

## 2014-10-19 ENCOUNTER — Ambulatory Visit (INDEPENDENT_AMBULATORY_CARE_PROVIDER_SITE_OTHER): Payer: PPO | Admitting: Cardiovascular Disease

## 2014-10-19 ENCOUNTER — Encounter: Payer: Self-pay | Admitting: Cardiovascular Disease

## 2014-10-19 VITALS — BP 116/72 | HR 84 | Ht 71.0 in | Wt 247.0 lb

## 2014-10-19 DIAGNOSIS — I517 Cardiomegaly: Secondary | ICD-10-CM

## 2014-10-19 DIAGNOSIS — I10 Essential (primary) hypertension: Secondary | ICD-10-CM

## 2014-10-19 DIAGNOSIS — G4733 Obstructive sleep apnea (adult) (pediatric): Secondary | ICD-10-CM

## 2014-10-19 DIAGNOSIS — I4891 Unspecified atrial fibrillation: Secondary | ICD-10-CM

## 2014-10-19 MED ORDER — APIXABAN 5 MG PO TABS: 5.0000 mg | ORAL_TABLET | Freq: Two times a day (BID) | ORAL | Status: DC

## 2014-10-19 MED ORDER — LOSARTAN POTASSIUM 50 MG PO TABS: 50.0000 mg | ORAL_TABLET | Freq: Every day | ORAL | Status: DC

## 2014-10-19 NOTE — Patient Instructions (Addendum)
Your physician wants you to follow-up in: 1 year with Dr. Bronson Ing. You will receive a reminder letter in the mail two months in advance. If you don't receive a letter, please call our office to schedule the follow-up appointment.  Your physician has recommended you make the following change in your medication:   Losartan 50 mg Daily  Eliquis 5 mg Two times Daily  Thank you for choosing [facility]!

## 2014-10-19 NOTE — Addendum Note (Signed)
Addended by: Levonne Hubert on: 10/19/2014 09:47 AM   Modules accepted: Orders

## 2014-10-19 NOTE — Progress Notes (Signed)
Patient ID: Sean Golden, male   DOB: Aug 20, 1946, 69 y.o.   MRN: [id number]      SUBJECTIVE: The patient presents for follow-up of atrial fibrillation, hypertension, and sleep apnea. He is now compliant with CPAP and he feels that his energy levels have improved tremendously. He denies chest pain, palpitations, shortness of breath, and leg swelling. He's had no bleeding problems with Eliquis. EGD in December 2015 demonstrated moderate gastritis.   Review of Systems: As per "subjective", otherwise negative.  No Known Allergies  Current Outpatient Prescriptions  Medication Sig Dispense Refill  . bismuth-metronidazole-tetracycline (PYLERA) 140-125-125 MG per capsule 3 PO QID FOR 10 DAYS 120 capsule 0  . clotrimazole (LOTRIMIN) 1 % cream Top tid to anal area for 10 days. 30 g 1  . diltiazem (CARDIZEM CD) 120 MG 24 hr capsule Take 1 capsule (120 mg total) by mouth 2 (two) times daily. 180 capsule 3  . losartan (COZAAR) 100 MG tablet Take 1 tablet by mouth daily.    Marland Kitchen losartan (COZAAR) 50 MG tablet Take 1 tablet (50 mg total) by mouth daily. 90 tablet 3  . Omega-3 Fatty Acids (FISH OIL) 1000 MG CAPS Take 1 capsule by mouth daily.    . pantoprazole (PROTONIX) 40 MG tablet 1 PO 30 MINUTES PRIOR TO MEALS BID FOR 3 MOS THEN QD 62 tablet 11   No current facility-administered medications for this visit.    Past Medical History  Diagnosis Date  . Hypertension   . Atrial fibrillation SEP 2015  . Sleep apnea   . Shortness of breath dyspnea     Past Surgical History  Procedure Laterality Date  . Colonoscopy  2005    SOLITARY RECTAL ULCER  . Cardiac electrophysiology study and ablation  2010?  Marland Kitchen Upper gastrointestinal endoscopy  2005 RMR  . Colonoscopy N/A 09/06/2014    Procedure: COLONOSCOPY;  Surgeon: Danie Binder, MD;  Location: [facility];  Service: Endoscopy;  Laterality: N/A;  200  . Esophagogastroduodenoscopy N/A 09/06/2014    Procedure: ESOPHAGOGASTRODUODENOSCOPY (EGD);   Surgeon: Danie Binder, MD;  Location: [facility];  Service: Endoscopy;  Laterality: N/A;  . Hemorrhoid banding N/A 09/06/2014    Procedure: HEMORRHOID BANDING;  Surgeon: Danie Binder, MD;  Location: [facility];  Service: Endoscopy;  Laterality: N/A;    History   Social History  . Marital Status: Married    Spouse Name: N/A    Number of Children: N/A  . Years of Education: N/A   Occupational History  . Full time    Social History Main Topics  . Smoking status: Former Smoker -- 0.50 packs/day for 15 years    Types: Cigarettes    Start date: 05/28/1967    Quit date: 05/27/1984  . Smokeless tobacco: Never Used  . Alcohol Use: 0.6 oz/week    1 Glasses of wine per week  . Drug Use: No  . Sexual Activity: Not on file   Other Topics Concern  . Not on file   Social History Narrative   Married-2 KIDS(AGE 5 AND 38).   No regular exercise     Filed Vitals:   10/19/14 0903  BP: 116/72  Pulse: 84  Height: 5\' 11"  (1.803 m)  Weight: 247 lb (112.038 kg)  SpO2: 94%    PHYSICAL EXAM General: NAD HEENT: Normal. Neck: No JVD, no thyromegaly. Lungs: Clear to auscultation bilaterally with normal respiratory effort. CV: Nondisplaced PMI. Regular rate and rhythm, normal S1/S2, no S3/S4, no  murmur. No pretibial or periankle edema.  Abdomen: Soft, nontender, obese, no distention.  Neurologic: Alert and oriented x 3.  Psych: Normal affect. Skin: Normal. Musculoskeletal: Normal range of motion, no gross deformities. Extremities: No clubbing or cyanosis.    ECG: Most recent ECG reviewed.      ASSESSMENT AND PLAN: 1. Atrial fibrillation: Currently in a regular rhythm on long-acting diltiazem 120 mg bid. I encouraged dietary and exercise modification for weight loss. Potential etiologies for atrial fibrillation include long-standing essential hypertension and moderately severe obstructive sleep apnea. TSH and free T4 were normal. CHADS-VASC score 2 (age, HTN) thus at  moderate risk for CVA. For this reason, I will continue anticoagulation with Eliquis 5 mg twice daily given lack of ulcerative disease on EGD and no further bleeding problems.  2. Moderately severe obstructive sleep apnea: Now compliant with CPAP.  I again encouraged dietary modification and weight loss with exercise as well. 3. Aortic root dilatation: Mildly dilated in 2010 with a diameter of 41 mm, now normal by echocardiogram.  4. Essential HTN: Well controlled. No changes. Continue losartan 50 mg daily. 5. RV enlargement: Mild to moderately dilated, and likely due to untreated moderately severe obstructive sleep apnea. Weight loss strongly advised. Now compliant with CPAP.  Dispo: f/u  1 year.   Kate Sable, M.D., F.A.C.C.

## 2014-12-06 ENCOUNTER — Encounter: Payer: Self-pay | Admitting: Gastroenterology

## 2014-12-06 ENCOUNTER — Ambulatory Visit (INDEPENDENT_AMBULATORY_CARE_PROVIDER_SITE_OTHER): Payer: PPO | Admitting: Gastroenterology

## 2014-12-06 VITALS — BP 125/80 | HR 78 | Temp 97.5°F | Ht 71.0 in | Wt 250.8 lb

## 2014-12-06 DIAGNOSIS — B9681 Helicobacter pylori [H. pylori] as the cause of diseases classified elsewhere: Secondary | ICD-10-CM

## 2014-12-06 DIAGNOSIS — K2970 Gastritis, unspecified, without bleeding: Principal | ICD-10-CM

## 2014-12-06 DIAGNOSIS — Z86010 Personal history of colonic polyps: Secondary | ICD-10-CM

## 2014-12-06 NOTE — Assessment & Plan Note (Signed)
Tubular adenomas on recent colonoscopy. No internal hemorrhoids appreciated. Rectal itching/bleeding secondary due to peri-anal skin disease. Improvement with dermacort. Contact us if any worsening of symptoms. Otherwise, next colonoscopy in 5 years.

## 2014-12-06 NOTE — Patient Instructions (Signed)
Decrease Protonix to just once a day, 30 minutes before breakfast.   We will send you a lab order to complete in about 6 months. You will need to be off of the Protonix for 14 days prior to do this test; once it is done, you can restart the Protonix. This is checking to make sure the bacteria (H.pylori) is completely gone.   We will see you back in 1 year! Your next colonoscopy is in 5 years.

## 2014-12-06 NOTE — Progress Notes (Signed)
Referring Provider: Sinda Du, MD Primary Care Physician:  Alonza Bogus, MD  Chief Complaint  Patient presents with  . Follow-up    doing ok    HPI:   Sean Golden is a 69 y.o. male presenting today with a history of   Past Medical History  Diagnosis Date  . Hypertension   . Atrial fibrillation SEP 2015  . Sleep apnea   . Shortness of breath dyspnea   . Helicobacter pylori gastritis Dec 2015    Treated with Pylera    Past Surgical History  Procedure Laterality Date  . Colonoscopy  2005    SOLITARY RECTAL ULCER  . Cardiac electrophysiology study and ablation  2010?  Marland Kitchen Upper gastrointestinal endoscopy  2005 RMR  . Colonoscopy N/A 09/06/2014    Dr. Oneida Alar: three colon polyps, moderate diverticulosis throughout the entire examined colon  . Esophagogastroduodenoscopy N/A 09/06/2014    Dr. Fields:moderate erosive gastritis, no Barrett's. +H.pylori gastritis, treated with Pylera  . Hemorrhoid banding N/A 09/06/2014    NO BANDING PERFORMED. NO INTERNAL HEMORRHOIDS IDENTIFIED     Current Outpatient Prescriptions  Medication Sig Dispense Refill  . apixaban (ELIQUIS) 5 MG TABS tablet Take 1 tablet (5 mg total) by mouth 2 (two) times daily. 60 tablet 11  . bismuth-metronidazole-tetracycline (PYLERA) 140-125-125 MG per capsule 3 PO QID FOR 10 DAYS (Patient not taking: Reported on 12/06/2014) 120 capsule 0  . clotrimazole (LOTRIMIN) 1 % cream Top tid to anal area for 10 days. (Patient not taking: Reported on 12/06/2014) 30 g 1  . diltiazem (CARDIZEM CD) 120 MG 24 hr capsule Take 1 capsule (120 mg total) by mouth 2 (two) times daily. 180 capsule 3  . losartan (COZAAR) 50 MG tablet Take 1 tablet (50 mg total) by mouth daily. 90 tablet 3  . Omega-3 Fatty Acids (FISH OIL) 1000 MG CAPS Take 1 capsule by mouth daily.    . pantoprazole (PROTONIX) 40 MG tablet 1 PO 30 MINUTES PRIOR TO MEALS BID FOR 3 MOS THEN QD 62 tablet 11   No current facility-administered medications for  this visit.    Allergies as of 12/06/2014  . (No Known Allergies)    Family History  Problem Relation Age of Onset  . Hypertension Mother   . Heart attack Father   . Hyperlipidemia Sister   . Hyperlipidemia Brother   . Colon polyps Brother   . Hypertension Brother   . Hypertension Sister   . Diabetes Brother   . Stroke Other   . Diabetes Other   . Hypertension Other   . Hyperlipidemia Other   . Colon cancer Neg Hx     History   Social History  . Marital Status: Married    Spouse Name: N/A  . Number of Children: N/A  . Years of Education: N/A   Occupational History  . Full time    Social History Main Topics  . Smoking status: Former Smoker -- 0.50 packs/day for 15 years    Types: Cigarettes    Start date: 05/28/1967    Quit date: 05/27/1984  . Smokeless tobacco: Never Used  . Alcohol Use: 0.6 oz/week    1 Glasses of wine per week  . Drug Use: No  . Sexual Activity: Not on file   Other Topics Concern  . None   Social History Narrative   Married-2 KIDS(AGE 42 AND 38).   No regular exercise    Review of Systems: Gen: Denies fever, chills, anorexia. Denies fatigue,  weakness, weight loss.  CV: Denies chest pain, palpitations, syncope, peripheral edema, and claudication. Resp: Denies dyspnea at rest, cough, wheezing, coughing up blood, and pleurisy. GI: Denies vomiting blood, jaundice, and fecal incontinence.   Denies dysphagia or odynophagia. Derm: Denies rash, itching, dry skin Psych: Denies depression, anxiety, memory loss, confusion. No homicidal or suicidal ideation.  Heme: Denies bruising, bleeding, and enlarged lymph nodes.  Physical Exam: BP 125/80 mmHg  Pulse 78  Temp(Src) 97.5 F (36.4 C) (Oral)  Ht 5\' 11"  (1.803 m)  Wt 250 lb 12.8 oz (113.762 kg)  BMI 34.99 kg/m2 General:   Alert and oriented. No distress noted. Pleasant and cooperative.  Head:  Normocephalic and atraumatic. Eyes:  Conjuctiva clear without scleral icterus. Mouth:  Oral  mucosa pink and moist. Good dentition. No lesions. Neck:  Supple, without mass or thyromegaly. Heart:  S1, S2 present without murmurs, rubs, or gallops. Regular rate and rhythm. Abdomen:  +BS, soft, non-tender and non-distended. No rebound or guarding. No HSM or masses noted. Msk:  Symmetrical without gross deformities. Normal posture. Pulses:  2+ DP noted bilaterally Extremities:  Without edema. Neurologic:  Alert and  oriented x4;  grossly normal neurologically. Skin:  Intact without significant lesions or rashes. Cervical Nodes:  No significant cervical adenopathy. Psych:  Alert and cooperative. Normal mood and affect.

## 2014-12-06 NOTE — Assessment & Plan Note (Signed)
Completed treatment with Pylera. Will send lab orders for urea breath test in about 6 months to document eradication. Will need to be off PPI X 14 days prior. Decrease Protonix to once a day. Return in 1 year or sooner as needed.

## 2014-12-06 NOTE — Progress Notes (Signed)
cc'ed to pcp °

## 2014-12-06 NOTE — Progress Notes (Signed)
Referring Provider: Sinda Du, MD Primary Care Physician:  Alonza Bogus, MD  Primary GI: Dr. Oneida Alar   Chief Complaint  Patient presents with  . Follow-up    doing ok    HPI:   Sean Golden is a 69 y.o. male presenting today with a history of H.pylori gastritis, GERD, rectal bleeding, and itching. Recently underwent EGD and colonoscopy. Treated with Pylera for H.pylori gastritis.   No abdominal pain. No N/V. On Protonix BID. No constipation or diarrhea. Occasional low-volume hematochezia. Now taking dermacort, which works better than lotrisone. No itching or anal pruritis.   Past Medical History  Diagnosis Date  . Hypertension   . Atrial fibrillation SEP 2015  . Sleep apnea   . Shortness of breath dyspnea   . Helicobacter pylori gastritis Dec 2015    Treated with Pylera    Past Surgical History  Procedure Laterality Date  . Colonoscopy  2005    SOLITARY RECTAL ULCER  . Cardiac electrophysiology study and ablation  2010?  Marland Kitchen Upper gastrointestinal endoscopy  2005 RMR  . Colonoscopy N/A 09/06/2014    Dr. Oneida Alar: three colon polyps, moderate diverticulosis throughout the entire examined colon  . Esophagogastroduodenoscopy N/A 09/06/2014    Dr. Fields:moderate erosive gastritis, no Barrett's. +H.pylori gastritis, treated with Pylera  . Hemorrhoid banding N/A 09/06/2014    NO BANDING PERFORMED. NO INTERNAL HEMORRHOIDS IDENTIFIED     Current Outpatient Prescriptions  Medication Sig Dispense Refill  . apixaban (ELIQUIS) 5 MG TABS tablet Take 1 tablet (5 mg total) by mouth 2 (two) times daily. 60 tablet 11  . diltiazem (CARDIZEM CD) 120 MG 24 hr capsule Take 1 capsule (120 mg total) by mouth 2 (two) times daily. 180 capsule 3  . losartan (COZAAR) 50 MG tablet Take 1 tablet (50 mg total) by mouth daily. 90 tablet 3  . Omega-3 Fatty Acids (FISH OIL) 1000 MG CAPS Take 1 capsule by mouth daily.    . pantoprazole (PROTONIX) 40 MG tablet 1 PO 30 MINUTES PRIOR TO MEALS  BID FOR 3 MOS THEN QD 62 tablet 11   No current facility-administered medications for this visit.    Allergies as of 12/06/2014  . (No Known Allergies)    Family History  Problem Relation Age of Onset  . Hypertension Mother   . Heart attack Father   . Hyperlipidemia Sister   . Hyperlipidemia Brother   . Colon polyps Brother   . Hypertension Brother   . Hypertension Sister   . Diabetes Brother   . Stroke Other   . Diabetes Other   . Hypertension Other   . Hyperlipidemia Other   . Colon cancer Neg Hx     History   Social History  . Marital Status: Married    Spouse Name: N/A  . Number of Children: N/A  . Years of Education: N/A   Occupational History  . Full time    Social History Main Topics  . Smoking status: Former Smoker -- 0.50 packs/day for 15 years    Types: Cigarettes    Start date: 05/28/1967    Quit date: 05/27/1984  . Smokeless tobacco: Never Used  . Alcohol Use: 0.6 oz/week    1 Glasses of wine per week  . Drug Use: No  . Sexual Activity: Not on file   Other Topics Concern  . None   Social History Narrative   Married-2 KIDS(AGE 64 AND 38).   No regular exercise    Review of Systems:  Negative unless mentioned in HPI  Physical Exam: BP 125/80 mmHg  Pulse 78  Temp(Src) 97.5 F (36.4 C) (Oral)  Ht 5\' 11"  (1.803 m)  Wt 250 lb 12.8 oz (113.762 kg)  BMI 34.99 kg/m2 General:   Alert and oriented. No distress noted. Pleasant and cooperative.  Head:  Normocephalic and atraumatic. Eyes:  Conjuctiva clear without scleral icterus. Abdomen:  +BS, soft, non-tender and non-distended. No rebound or guarding. No HSM or masses noted. Msk:  Symmetrical without gross deformities. Normal posture. Extremities:  Without edema. Neurologic:  Alert and  oriented x4;  grossly normal neurologically. Psych:  Alert and cooperative. Normal mood and affect.

## 2014-12-21 ENCOUNTER — Other Ambulatory Visit: Payer: Self-pay | Admitting: Gastroenterology

## 2014-12-21 DIAGNOSIS — B9681 Helicobacter pylori [H. pylori] as the cause of diseases classified elsewhere: Secondary | ICD-10-CM

## 2014-12-21 DIAGNOSIS — K2970 Gastritis, unspecified, without bleeding: Principal | ICD-10-CM

## 2015-04-25 ENCOUNTER — Other Ambulatory Visit: Payer: Self-pay

## 2015-04-25 DIAGNOSIS — K2970 Gastritis, unspecified, without bleeding: Principal | ICD-10-CM

## 2015-04-25 DIAGNOSIS — B9681 Helicobacter pylori [H. pylori] as the cause of diseases classified elsewhere: Secondary | ICD-10-CM

## 2015-05-11 ENCOUNTER — Encounter: Payer: PPO | Admitting: Adult Health

## 2015-05-11 NOTE — Progress Notes (Signed)
Cardiology Office Note   ERROR CANCELLED

## 2015-07-23 ENCOUNTER — Other Ambulatory Visit: Payer: Self-pay | Admitting: Cardiovascular Disease

## 2015-09-06 ENCOUNTER — Encounter: Payer: Self-pay | Admitting: Cardiovascular Disease

## 2015-09-06 ENCOUNTER — Ambulatory Visit (INDEPENDENT_AMBULATORY_CARE_PROVIDER_SITE_OTHER): Payer: PPO | Admitting: Cardiovascular Disease

## 2015-09-06 VITALS — BP 132/78 | HR 94 | Ht 71.0 in | Wt 258.0 lb

## 2015-09-06 DIAGNOSIS — R5383 Other fatigue: Secondary | ICD-10-CM

## 2015-09-06 DIAGNOSIS — G4733 Obstructive sleep apnea (adult) (pediatric): Secondary | ICD-10-CM | POA: Diagnosis not present

## 2015-09-06 DIAGNOSIS — I10 Essential (primary) hypertension: Secondary | ICD-10-CM

## 2015-09-06 DIAGNOSIS — R0602 Shortness of breath: Secondary | ICD-10-CM | POA: Diagnosis not present

## 2015-09-06 DIAGNOSIS — I4891 Unspecified atrial fibrillation: Secondary | ICD-10-CM

## 2015-09-06 DIAGNOSIS — I517 Cardiomegaly: Secondary | ICD-10-CM

## 2015-09-06 DIAGNOSIS — I7789 Other specified disorders of arteries and arterioles: Secondary | ICD-10-CM

## 2015-09-06 MED ORDER — DILTIAZEM HCL ER COATED BEADS 240 MG PO CP24: 240.0000 mg | ORAL_CAPSULE | Freq: Every day | ORAL | Status: DC

## 2015-09-06 NOTE — Progress Notes (Signed)
Patient ID: Sean Golden, male   DOB: 1946/01/19, 69 y.o.   MRN: [id number]      SUBJECTIVE: The patient presents for follow-up of atrial fibrillation, hypertension, and sleep apnea. He continues to feel markedly fatigued with minimal exertion. He has had occasional chest pains which have not lasted very long but does have exertional dyspnea. He remains compliant with CPAP. ECG performed in the office today demonstrates atrial fibrillation and flutter, heart rate 87 bpm.   Review of Systems: As per "subjective", otherwise negative.  No Known Allergies  Current Outpatient Prescriptions  Medication Sig Dispense Refill  . apixaban (ELIQUIS) 5 MG TABS tablet Take 1 tablet (5 mg total) by mouth 2 (two) times daily. 60 tablet 11  . diltiazem (CARDIZEM CD) 120 MG 24 hr capsule TAKE ONE (1) CAPSULE BY MOUTH TWICE A DAY. (EVERY 12 HOURS.) 180 capsule 0  . losartan (COZAAR) 50 MG tablet Take 1 tablet (50 mg total) by mouth daily. 90 tablet 3  . pantoprazole (PROTONIX) 40 MG tablet 1 PO 30 MINUTES PRIOR TO MEALS BID FOR 3 MOS THEN QD 62 tablet 11   No current facility-administered medications for this visit.    Past Medical History  Diagnosis Date  . Hypertension   . Atrial fibrillation ([place]) SEP 2015  . Sleep apnea   . Shortness of breath dyspnea   . Helicobacter pylori gastritis Dec 2015    Treated with Pylera    Past Surgical History  Procedure Laterality Date  . Colonoscopy  2005    SOLITARY RECTAL ULCER  . Cardiac electrophysiology study and ablation  2010?  Marland Kitchen Upper gastrointestinal endoscopy  2005 RMR  . Colonoscopy N/A 09/06/2014    Dr. Oneida Alar: three colon polyps, moderate diverticulosis throughout the entire examined colon  . Esophagogastroduodenoscopy N/A 09/06/2014    Dr. Fields:moderate erosive gastritis, no Barrett's. +H.pylori gastritis, treated with Pylera  . Hemorrhoid banding N/A 09/06/2014    NO BANDING PERFORMED. NO INTERNAL HEMORRHOIDS IDENTIFIED     Social  History   Social History  . Marital Status: Married    Spouse Name: N/A  . Number of Children: N/A  . Years of Education: N/A   Occupational History  . Full time    Social History Main Topics  . Smoking status: Former Smoker -- 0.50 packs/day for 15 years    Types: Cigarettes    Start date: 05/28/1967    Quit date: 05/27/1984  . Smokeless tobacco: Never Used  . Alcohol Use: 0.6 oz/week    1 Glasses of wine per week  . Drug Use: No  . Sexual Activity: Not on file   Other Topics Concern  . Not on file   Social History Narrative   Married-2 KIDS(AGE 57 AND 38).   No regular exercise     Filed Vitals:   09/06/15 1358  BP: 132/78  Pulse: 94  Height: 5\' 11"  (1.803 m)  Weight: 258 lb (117.028 kg)  SpO2: 97%    PHYSICAL EXAM General: NAD HEENT: Normal. Neck: No JVD, no thyromegaly. Lungs: Clear to auscultation bilaterally with normal respiratory effort. CV: Regular rate and irregular rhythm, normal S1/S2, no S3, no murmur. No pretibial or periankle edema.  Abdomen: Soft, nontender, obese, no distention.  Neurologic: Alert and oriented x 3.  Psych: Normal affect. Skin: Normal. Musculoskeletal: Normal range of motion, no gross deformities. Extremities: No clubbing or cyanosis.   ECG: Most recent ECG reviewed.      ASSESSMENT AND PLAN: 1. Shortness of  breath and fatigue in the context of atrial fibrillation: Symptoms may be related to elevated HR's with exertion. Will increase long-acting diltiazem to 240 mg q am and 120 mg q pm.  Will also obtain a Lexiscan Cardiolite stress test to rule out ischemic heart disease. CHADS-VASC score 2 (age, HTN) thus at moderate risk for CVA. For this reason, I will continue anticoagulation with Eliquis 5 mg twice daily given lack of ulcerative disease on prior EGD and no further bleeding problems.   2. Moderately severe obstructive sleep apnea: Compliant with CPAP.  3. Aortic root dilatation: Mildly dilated in 2010 with a  diameter of 41 mm, now normal by echocardiogram on 06/09/14.   4. Essential HTN: Well controlled. No changes. Continue losartan 50 mg daily.  5. RV enlargement: Mild to moderately dilated, and likely due to years of untreated moderately severe obstructive sleep apnea. Weight loss strongly advised. Compliant with CPAP.  Dispo: f/u6 weeks.   Kate Sable, M.D., F.A.C.C.

## 2015-09-06 NOTE — Patient Instructions (Signed)
Medication Instructions:  INCREASE DILTIAZEM to 240 MG IN THE MORNING ONLY - CONTINUE TAKING 120 MG IN THE EVENING  Labwork: NONE  Testing/Procedures: Your physician has requested that you have a lexiscan myoview. For further information please visit [email or web address]. Please follow instruction sheet, as given.    Follow-Up: Your physician recommends that you schedule a follow-up appointment in: [facility]    Any Other Special Instructions Will Be Listed Below (If Applicable).     If you need a refill on your cardiac medications before your next appointment, please call your pharmacy. Thanks for choosing [facility]!!!

## 2015-09-15 ENCOUNTER — Encounter (HOSPITAL_COMMUNITY): Payer: PPO

## 2015-09-19 ENCOUNTER — Encounter (HOSPITAL_COMMUNITY)
Admission: RE | Admit: 2015-09-19 | Discharge: 2015-09-19 | Disposition: A | Discharge status: Home / Self Care | Payer: PPO | Source: Ambulatory Visit | Attending: Cardiovascular Disease | Admitting: Cardiovascular Disease

## 2015-09-19 ENCOUNTER — Encounter (HOSPITAL_COMMUNITY): Payer: Self-pay

## 2015-09-19 ENCOUNTER — Inpatient Hospital Stay (HOSPITAL_COMMUNITY): Admission: RE | Admit: 2015-09-19 | Payer: PPO | Source: Ambulatory Visit

## 2015-09-19 DIAGNOSIS — R0602 Shortness of breath: Secondary | ICD-10-CM | POA: Diagnosis not present

## 2015-09-19 DIAGNOSIS — R5383 Other fatigue: Secondary | ICD-10-CM

## 2015-09-19 LAB — NM MYOCAR MULTI W/SPECT W/WALL MOTION / EF
CHL CUP NUCLEAR SSS: 5
CHL CUP RESTING HR STRESS: 80 {beats}/min
CSEPPHR: 113 {beats}/min
LV dias vol: 60 mL
LVSYSVOL: 17 mL
RATE: 0.39
SDS: 2
SRS: 3
TID: 1.07

## 2015-09-19 MED ORDER — SODIUM CHLORIDE 0.9 % IJ SOLN
INTRAMUSCULAR | Status: AC
  Administered 2015-09-19: 10 mL via INTRAVENOUS
  Filled 2015-09-19: qty 3

## 2015-09-19 MED ORDER — REGADENOSON 0.4 MG/5ML IV SOLN
INTRAVENOUS | Status: AC
  Administered 2015-09-19: 0.4 mg via INTRAVENOUS
  Filled 2015-09-19: qty 5

## 2015-09-19 MED ORDER — TECHNETIUM TC 99M SESTAMIBI - CARDIOLITE
30.0000 | Freq: Once | INTRAVENOUS | Status: AC | PRN
  Administered 2015-09-19: 32 via INTRAVENOUS

## 2015-09-19 MED ORDER — TECHNETIUM TC 99M SESTAMIBI GENERIC - CARDIOLITE
10.0000 | Freq: Once | INTRAVENOUS | Status: AC | PRN
  Administered 2015-09-19: 10.7 via INTRAVENOUS

## 2015-09-28 ENCOUNTER — Other Ambulatory Visit: Payer: Self-pay | Admitting: Gastroenterology

## 2015-10-25 ENCOUNTER — Encounter: Payer: Self-pay | Admitting: Cardiovascular Disease

## 2015-10-25 ENCOUNTER — Ambulatory Visit (INDEPENDENT_AMBULATORY_CARE_PROVIDER_SITE_OTHER): Payer: PPO | Admitting: Cardiovascular Disease

## 2015-10-25 VITALS — BP 122/84 | HR 106 | Ht 71.0 in | Wt 248.0 lb

## 2015-10-25 DIAGNOSIS — I10 Essential (primary) hypertension: Secondary | ICD-10-CM | POA: Diagnosis not present

## 2015-10-25 DIAGNOSIS — I517 Cardiomegaly: Secondary | ICD-10-CM

## 2015-10-25 DIAGNOSIS — I4891 Unspecified atrial fibrillation: Secondary | ICD-10-CM

## 2015-10-25 DIAGNOSIS — R0602 Shortness of breath: Secondary | ICD-10-CM | POA: Diagnosis not present

## 2015-10-25 DIAGNOSIS — G4733 Obstructive sleep apnea (adult) (pediatric): Secondary | ICD-10-CM | POA: Diagnosis not present

## 2015-10-25 DIAGNOSIS — I7789 Other specified disorders of arteries and arterioles: Secondary | ICD-10-CM

## 2015-10-25 DIAGNOSIS — R5383 Other fatigue: Secondary | ICD-10-CM | POA: Diagnosis not present

## 2015-10-25 NOTE — Patient Instructions (Signed)
Medication Instructions:  TAKE DILTIAZEM 240 MG EVERY MORNING  TAKE DILTIAZEM 120 MG EVERY EVENING   Labwork: NONE  Testing/Procedures: NONE  Follow-Up: Your physician recommends that you schedule a follow-up appointment in: [facility]   Any Other Special Instructions Will Be Listed Below (If Applicable).     If you need a refill on your cardiac medications before your next appointment, please call your pharmacy.

## 2015-10-25 NOTE — Progress Notes (Signed)
Patient ID: Sean Golden, male   DOB: May 14, 1946, 70 y.o.   MRN: [id number]      SUBJECTIVE: The patient returns for follow-up after undergoing cardiovascular testing performed for the evaluation of shortness of breath and fatigue.  Nuclear stress testing on 09/19/15 was low risk with no evidence of ischemia.  He continues to have exertional dyspnea and fatigue, but he only took the increased dose of diltiazem for possibly 2 weeks and then reduced the dose on his own. He is currently uncertain of how many milligrams tablets he is actually taking. Denies leg swelling.  Review of Systems: As per "subjective", otherwise negative.  No Known Allergies  Current Outpatient Prescriptions  Medication Sig Dispense Refill  . apixaban (ELIQUIS) 5 MG TABS tablet Take 1 tablet (5 mg total) by mouth 2 (two) times daily. 60 tablet 11  . diltiazem (CARDIZEM CD) 120 MG 24 hr capsule TAKE ONE (1) CAPSULE BY MOUTH TWICE A DAY. (EVERY 12 HOURS.) 180 capsule 0  . diltiazem (CARDIZEM CD) 240 MG 24 hr capsule Take 1 capsule (240 mg total) by mouth daily. 90 capsule 3  . losartan (COZAAR) 50 MG tablet Take 1 tablet (50 mg total) by mouth daily. 90 tablet 3  . pantoprazole (PROTONIX) 40 MG tablet Take 1 tablet (40 mg total) by mouth daily. 30 tablet 11   No current facility-administered medications for this visit.    Past Medical History  Diagnosis Date  . Hypertension   . Atrial fibrillation ([place]) SEP 2015  . Sleep apnea   . Shortness of breath dyspnea   . Helicobacter pylori gastritis Dec 2015    Treated with Pylera    Past Surgical History  Procedure Laterality Date  . Colonoscopy  2005    SOLITARY RECTAL ULCER  . Cardiac electrophysiology study and ablation  2010?  Marland Kitchen Upper gastrointestinal endoscopy  2005 RMR  . Colonoscopy N/A 09/06/2014    Dr. Oneida Alar: three colon polyps, moderate diverticulosis throughout the entire examined colon  . Esophagogastroduodenoscopy N/A 09/06/2014    Dr.  Fields:moderate erosive gastritis, no Barrett's. +H.pylori gastritis, treated with Pylera  . Hemorrhoid banding N/A 09/06/2014    NO BANDING PERFORMED. NO INTERNAL HEMORRHOIDS IDENTIFIED     Social History   Social History  . Marital Status: Married    Spouse Name: N/A  . Number of Children: N/A  . Years of Education: N/A   Occupational History  . Full time    Social History Main Topics  . Smoking status: Former Smoker -- 0.50 packs/day for 15 years    Types: Cigarettes    Start date: 05/28/1967    Quit date: 05/27/1984  . Smokeless tobacco: Never Used  . Alcohol Use: 0.6 oz/week    1 Glasses of wine per week  . Drug Use: No  . Sexual Activity: Not on file   Other Topics Concern  . Not on file   Social History Narrative   Married-2 KIDS(AGE 69 AND 38).   No regular exercise     Filed Vitals:   10/25/15 1319  BP: 122/84  Pulse: 106  Height: 5\' 11"  (1.803 m)  Weight: 248 lb (112.492 kg)  SpO2: 94%    PHYSICAL EXAM General: NAD HEENT: Normal. Neck: No JVD, no thyromegaly. Lungs: Clear to auscultation bilaterally with normal respiratory effort. CV: Tachycardic, irregular rhythm, normal S1/S2, no S3, no murmur. No pretibial or periankle edema.  Abdomen: Soft, nontender, obese, no distention.  Neurologic: Alert and oriented x 3.  Psych: Normal affect. Skin: Normal. Musculoskeletal: Normal range of motion, no gross deformities. Extremities: No clubbing or cyanosis.   ECG: Most recent ECG reviewed.    ASSESSMENT AND PLAN: 1. Shortness of breath and fatigue in the context of atrial fibrillation: I asked him to call us back to inform us of the amount of diltiazem he is actually taking. I will again increase diltiazem to 240 mg every morning and 120 mg every evening. If symptoms don't resolve with optimal heart rate control, I would then plan for direct current cardioversion. Lexiscan Cardiolite stress test effectively ruled out ischemic heart  disease. CHADS-VASC score 2 (age, HTN) thus at moderate risk for CVA. For this reason, I will continue anticoagulation with Eliquis 5 mg twice daily given lack of ulcerative disease on prior EGD and no further bleeding problems.   2. Moderately severe obstructive sleep apnea: Compliant with CPAP.  3. Aortic root dilatation: Mildly dilated in 2010 with a diameter of 41 mm, now normal by echocardiogram on 06/09/14.   4. Essential HTN: Well controlled. No changes. Continue losartan 50 mg daily.  5. RV enlargement: Mild to moderately dilated, and likely due to years of untreated moderately severe obstructive sleep apnea. Weight loss strongly advised. Compliant with CPAP.  Dispo: f/u6 weeks.  Kate Sable, M.D., F.A.C.C.

## 2015-10-30 ENCOUNTER — Other Ambulatory Visit: Payer: Self-pay | Admitting: Cardiovascular Disease

## 2015-11-07 ENCOUNTER — Encounter: Payer: Self-pay | Admitting: Gastroenterology

## 2015-11-15 DIAGNOSIS — G4733 Obstructive sleep apnea (adult) (pediatric): Secondary | ICD-10-CM | POA: Diagnosis not present

## 2015-11-15 DIAGNOSIS — I10 Essential (primary) hypertension: Secondary | ICD-10-CM | POA: Diagnosis not present

## 2015-11-15 DIAGNOSIS — I482 Chronic atrial fibrillation: Secondary | ICD-10-CM | POA: Diagnosis not present

## 2015-11-16 DIAGNOSIS — Z125 Encounter for screening for malignant neoplasm of prostate: Secondary | ICD-10-CM | POA: Diagnosis not present

## 2015-11-16 DIAGNOSIS — G4733 Obstructive sleep apnea (adult) (pediatric): Secondary | ICD-10-CM | POA: Diagnosis not present

## 2015-11-16 DIAGNOSIS — I10 Essential (primary) hypertension: Secondary | ICD-10-CM | POA: Diagnosis not present

## 2015-11-16 DIAGNOSIS — I482 Chronic atrial fibrillation: Secondary | ICD-10-CM | POA: Diagnosis not present

## 2015-11-16 DIAGNOSIS — R739 Hyperglycemia, unspecified: Secondary | ICD-10-CM | POA: Diagnosis not present

## 2015-11-27 ENCOUNTER — Other Ambulatory Visit: Payer: Self-pay | Admitting: Cardiovascular Disease

## 2015-12-01 ENCOUNTER — Ambulatory Visit (INDEPENDENT_AMBULATORY_CARE_PROVIDER_SITE_OTHER): Payer: PPO | Admitting: Cardiovascular Disease

## 2015-12-01 ENCOUNTER — Encounter: Payer: Self-pay | Admitting: Cardiovascular Disease

## 2015-12-01 VITALS — BP 126/82 | HR 79 | Ht 71.0 in | Wt 249.0 lb

## 2015-12-01 DIAGNOSIS — I481 Persistent atrial fibrillation: Secondary | ICD-10-CM

## 2015-12-01 DIAGNOSIS — I10 Essential (primary) hypertension: Secondary | ICD-10-CM | POA: Diagnosis not present

## 2015-12-01 DIAGNOSIS — Z7189 Other specified counseling: Secondary | ICD-10-CM

## 2015-12-01 DIAGNOSIS — R5383 Other fatigue: Secondary | ICD-10-CM

## 2015-12-01 DIAGNOSIS — I517 Cardiomegaly: Secondary | ICD-10-CM

## 2015-12-01 DIAGNOSIS — I7789 Other specified disorders of arteries and arterioles: Secondary | ICD-10-CM

## 2015-12-01 DIAGNOSIS — I4819 Other persistent atrial fibrillation: Secondary | ICD-10-CM

## 2015-12-01 DIAGNOSIS — G4733 Obstructive sleep apnea (adult) (pediatric): Secondary | ICD-10-CM

## 2015-12-01 DIAGNOSIS — Z7182 Exercise counseling: Secondary | ICD-10-CM

## 2015-12-01 DIAGNOSIS — R0602 Shortness of breath: Secondary | ICD-10-CM

## 2015-12-01 NOTE — Progress Notes (Signed)
Patient ID: Sean Golden, male   DOB: August 16, 1946, 70 y.o.   MRN: [id number]      SUBJECTIVE: The patient returns for follow-up for rapid atrial fibrillation. He has been battling bronchitis and recently finished an antibiotic course. He denies chest pain. He shortness of breath which is chronic has remained the same over the past 12 months. He uses CPAP nightly.   Review of Systems: As per "subjective", otherwise negative.  No Known Allergies  Current Outpatient Prescriptions  Medication Sig Dispense Refill  . diltiazem (CARDIZEM CD) 120 MG 24 hr capsule TAKE ONE (1) CAPSULE BY MOUTH TWICE A DAY. (EVERY 12 HOURS.) 180 capsule 0  . diltiazem (CARDIZEM CD) 240 MG 24 hr capsule Take 1 capsule (240 mg total) by mouth daily. 90 capsule 3  . ELIQUIS 5 MG TABS tablet TAKE ONE TABLET TWICE DAILY 60 tablet 6  . losartan (COZAAR) 50 MG tablet TAKE ONE (1) TABLET EACH DAY 90 tablet 1  . pantoprazole (PROTONIX) 40 MG tablet Take 1 tablet (40 mg total) by mouth daily. 30 tablet 11   No current facility-administered medications for this visit.    Past Medical History  Diagnosis Date  . Hypertension   . Atrial fibrillation ([place]) SEP 2015  . Sleep apnea   . Shortness of breath dyspnea   . Helicobacter pylori gastritis Dec 2015    Treated with Pylera    Past Surgical History  Procedure Laterality Date  . Colonoscopy  2005    SOLITARY RECTAL ULCER  . Cardiac electrophysiology study and ablation  2010?  Marland Kitchen Upper gastrointestinal endoscopy  2005 RMR  . Colonoscopy N/A 09/06/2014    Dr. Oneida Alar: three colon polyps, moderate diverticulosis throughout the entire examined colon  . Esophagogastroduodenoscopy N/A 09/06/2014    Dr. Fields:moderate erosive gastritis, no Barrett's. +H.pylori gastritis, treated with Pylera  . Hemorrhoid banding N/A 09/06/2014    NO BANDING PERFORMED. NO INTERNAL HEMORRHOIDS IDENTIFIED     Social History   Social History  . Marital Status: Married    Spouse Name:  N/A  . Number of Children: N/A  . Years of Education: N/A   Occupational History  . Full time    Social History Main Topics  . Smoking status: Former Smoker -- 0.50 packs/day for 15 years    Types: Cigarettes    Start date: 05/28/1967    Quit date: 05/27/1984  . Smokeless tobacco: Never Used  . Alcohol Use: 0.6 oz/week    1 Glasses of wine per week  . Drug Use: No  . Sexual Activity: Not on file   Other Topics Concern  . Not on file   Social History Narrative   Married-2 KIDS(AGE 8 AND 38).   No regular exercise     Filed Vitals:   12/01/15 0904  BP: 126/82  Pulse: 79  Height: 5\' 11"  (1.803 m)  Weight: 249 lb (112.946 kg)  SpO2: 95%    PHYSICAL EXAM General: NAD HEENT: Normal. Neck: No JVD, no thyromegaly. Lungs: Clear to auscultation bilaterally with normal respiratory effort. CV: Regular rate and rhythm, normal S1/S2, no S3, no murmur. No pretibial or periankle edema.  Abdomen: Soft, nontender, obese, no distention.  Neurologic: Alert and oriented x 3.  Psych: Normal affect. Skin: Normal. Musculoskeletal: Normal range of motion, no gross deformities. Extremities: No clubbing or cyanosis.   ECG: Most recent ECG reviewed.      ASSESSMENT AND PLAN: 1. Persistent atrial fibrillation: Now in a regular rhythm after increase  of diltiazem to 240 mg every morning and 120 mg every evening. Lexiscan Cardiolite stress test effectively ruled out ischemic heart disease. CHADS-VASC score 2 (age, HTN) thus at moderate risk for CVA. For this reason, I will continue anticoagulation with Eliquis 5 mg twice daily given lack of ulcerative disease on prior EGD and no further bleeding problems.   2. Moderately severe obstructive sleep apnea: Compliant with CPAP.  3. Aortic root dilatation: Mildly dilated in 2010 with a diameter of 41 mm, now normal by echocardiogram on 06/09/14.   4. Essential HTN: Well controlled. No changes. Continue losartan 50 mg daily.  5. RV  enlargement: Mild to moderately dilated, and likely due to years of untreated moderately severe obstructive sleep apnea. Weight loss strongly advised. Compliant with CPAP.  6. SOB: Due to bronchitis. Also obese, thus weight loss counseling given. Has lost 10 lbs since 08/2015.  Dispo: f/u6 months.  Kate Sable, M.D., F.A.C.C.

## 2015-12-01 NOTE — Patient Instructions (Signed)
Your physician wants you to follow-up in: 6 months You will receive a reminder letter in the mail two months in advance. If you don't receive a letter, please call our office to schedule the follow-up appointment.     Your physician recommends that you continue on your current medications as directed. Please refer to the Current Medication list given to you today.      Thank you for choosing [facility] !        

## 2016-04-23 DIAGNOSIS — M1990 Unspecified osteoarthritis, unspecified site: Secondary | ICD-10-CM | POA: Diagnosis not present

## 2016-04-23 DIAGNOSIS — M79671 Pain in right foot: Secondary | ICD-10-CM | POA: Diagnosis not present

## 2016-04-23 DIAGNOSIS — M792 Neuralgia and neuritis, unspecified: Secondary | ICD-10-CM | POA: Diagnosis not present

## 2016-05-28 ENCOUNTER — Other Ambulatory Visit: Payer: Self-pay

## 2016-05-28 MED ORDER — APIXABAN 5 MG PO TABS: 5.0000 mg | ORAL_TABLET | Freq: Two times a day (BID) | ORAL | 6 refills | Status: DC

## 2016-05-28 MED ORDER — LOSARTAN POTASSIUM 50 MG PO TABS: ORAL_TABLET | ORAL | 1 refills | Status: DC

## 2016-05-28 NOTE — Telephone Encounter (Signed)
Refill complet

## 2016-05-28 NOTE — Telephone Encounter (Signed)
eliquis efilled

## 2016-06-04 DIAGNOSIS — M19071 Primary osteoarthritis, right ankle and foot: Secondary | ICD-10-CM | POA: Diagnosis not present

## 2016-06-04 DIAGNOSIS — M792 Neuralgia and neuritis, unspecified: Secondary | ICD-10-CM | POA: Diagnosis not present

## 2016-06-04 DIAGNOSIS — M79671 Pain in right foot: Secondary | ICD-10-CM | POA: Diagnosis not present

## 2016-06-04 DIAGNOSIS — G4733 Obstructive sleep apnea (adult) (pediatric): Secondary | ICD-10-CM | POA: Diagnosis not present

## 2016-06-10 ENCOUNTER — Ambulatory Visit (INDEPENDENT_AMBULATORY_CARE_PROVIDER_SITE_OTHER): Payer: PPO | Admitting: Cardiovascular Disease

## 2016-06-10 ENCOUNTER — Encounter: Payer: Self-pay | Admitting: Cardiovascular Disease

## 2016-06-10 VITALS — BP 135/89 | HR 88 | Ht 71.0 in | Wt 248.0 lb

## 2016-06-10 DIAGNOSIS — I7789 Other specified disorders of arteries and arterioles: Secondary | ICD-10-CM

## 2016-06-10 DIAGNOSIS — I10 Essential (primary) hypertension: Secondary | ICD-10-CM

## 2016-06-10 DIAGNOSIS — I4819 Other persistent atrial fibrillation: Secondary | ICD-10-CM

## 2016-06-10 DIAGNOSIS — G4733 Obstructive sleep apnea (adult) (pediatric): Secondary | ICD-10-CM

## 2016-06-10 DIAGNOSIS — I481 Persistent atrial fibrillation: Secondary | ICD-10-CM | POA: Diagnosis not present

## 2016-06-10 DIAGNOSIS — I517 Cardiomegaly: Secondary | ICD-10-CM

## 2016-06-10 NOTE — Patient Instructions (Signed)
Your physician wants you to follow-up in: 1 year Dr Koneswaran You will receive a reminder letter in the mail two months in advance. If you don't receive a letter, please call our office to schedule the follow-up appointment.     Your physician recommends that you continue on your current medications as directed. Please refer to the Current Medication list given to you today.     Thank you for choosing [facility] !        

## 2016-06-10 NOTE — Progress Notes (Signed)
SUBJECTIVE: The patient returns for follow-up for rapid atrial fibrillation.  The patient denies any symptoms of chest pain, palpitations, shortness of breath, lightheadedness, dizziness, leg swelling, orthopnea, PND, and syncope.  Uses CPAP regularly.   Review of Systems: As per "subjective", otherwise negative.  No Known Allergies  Current Outpatient Prescriptions  Medication Sig Dispense Refill  . apixaban (ELIQUIS) 5 MG TABS tablet Take 1 tablet (5 mg total) by mouth 2 (two) times daily. 60 tablet 6  . diltiazem (CARDIZEM CD) 120 MG 24 hr capsule Take 120 mg by mouth daily. Take 2 tablets in the am (240 mg total) and take 1 tablet (120 mg total) in the pm    . losartan (COZAAR) 50 MG tablet TAKE ONE (1) TABLET EACH DAY 90 tablet 1  . pantoprazole (PROTONIX) 40 MG tablet Take 1 tablet (40 mg total) by mouth daily. 30 tablet 11   No current facility-administered medications for this visit.     Past Medical History:  Diagnosis Date  . Atrial fibrillation ([place]) SEP 2015  . Helicobacter pylori gastritis Dec 2015   Treated with Pylera  . Hypertension   . Shortness of breath dyspnea   . Sleep apnea     Past Surgical History:  Procedure Laterality Date  . CARDIAC ELECTROPHYSIOLOGY STUDY AND ABLATION  2010?  . COLONOSCOPY  2005   SOLITARY RECTAL ULCER  . COLONOSCOPY N/A 09/06/2014   Dr. Oneida Alar: three colon polyps, moderate diverticulosis throughout the entire examined colon  . ESOPHAGOGASTRODUODENOSCOPY N/A 09/06/2014   Dr. Fields:moderate erosive gastritis, no Barrett's. +H.pylori gastritis, treated with Pylera  . HEMORRHOID BANDING N/A 09/06/2014   NO BANDING PERFORMED. NO INTERNAL HEMORRHOIDS IDENTIFIED   . UPPER GASTROINTESTINAL ENDOSCOPY  2005 RMR    Social History   Social History  . Marital status: Married    Spouse name: N/A  . Number of children: N/A  . Years of education: N/A   Occupational History  . Full time    Social History Main Topics  .  Smoking status: Former Smoker    Packs/day: 0.50    Years: 15.00    Types: Cigarettes    Start date: 05/28/1967    Quit date: 05/27/1984  . Smokeless tobacco: Never Used  . Alcohol use 0.6 oz/week    1 Glasses of wine per week  . Drug use: No  . Sexual activity: Not on file   Other Topics Concern  . Not on file   Social History Narrative   Married-2 KIDS(AGE 65 AND 38).   No regular exercise     Vitals:   06/10/16 1559  BP: 135/89  Pulse: 88  SpO2: 94%  Weight: 248 lb (112.5 kg)  Height: 5\' 11"  (1.803 m)    PHYSICAL EXAM General: NAD HEENT: Normal. Neck: No JVD, no thyromegaly. Lungs: Clear to auscultation bilaterally with normal respiratory effort. CV: Nondisplaced PMI.  Regular rate and rhythm, normal S1/S2, no S3/S4, no murmur. No pretibial or periankle edema.  No carotid bruit.   Abdomen: Obese.  Neurologic: Alert and oriented.  Psych: Normal affect. Skin: Normal. Musculoskeletal: No gross deformities.    ECG: Most recent ECG reviewed.      ASSESSMENT AND PLAN: 1. Persistent atrial fibrillation: Remains in a regular rhythm after increase of diltiazem to 240 mg every morning and 120 mg every evening. Lexiscan Cardiolite stress test effectively ruled out ischemic heart disease. CHADS-VASC score 2 (age, HTN) thus at moderate risk for CVA. For this reason,  I will continue anticoagulation with Eliquis 5 mg twice daily given lack of ulcerative disease on prior EGD and no further bleeding problems.   2. Moderately severe obstructive sleep apnea: Compliant with CPAP.  3. Aortic root dilatation: Mildly dilated in 2010 with a diameter of 41 mm, normal by echocardiogram on 06/09/14.   4. Essential HTN: Borderline controlled. No changes. Continue losartan 50 mg daily.  5. RV enlargement: Mild to moderately dilated, and likely due to years of untreated moderately severe obstructive sleep apnea. Weight loss strongly advised. Compliant with CPAP.  Dispo: fu 1  year.  Kate Sable, M.D., F.A.C.C.

## 2016-07-16 DIAGNOSIS — G4733 Obstructive sleep apnea (adult) (pediatric): Secondary | ICD-10-CM | POA: Diagnosis not present

## 2016-07-16 DIAGNOSIS — K210 Gastro-esophageal reflux disease with esophagitis: Secondary | ICD-10-CM | POA: Diagnosis not present

## 2016-07-16 DIAGNOSIS — I10 Essential (primary) hypertension: Secondary | ICD-10-CM | POA: Diagnosis not present

## 2016-07-16 DIAGNOSIS — Z23 Encounter for immunization: Secondary | ICD-10-CM | POA: Diagnosis not present

## 2016-07-16 DIAGNOSIS — I482 Chronic atrial fibrillation: Secondary | ICD-10-CM | POA: Diagnosis not present

## 2016-09-19 DIAGNOSIS — G4733 Obstructive sleep apnea (adult) (pediatric): Secondary | ICD-10-CM | POA: Diagnosis not present

## 2016-10-16 ENCOUNTER — Other Ambulatory Visit: Payer: Self-pay

## 2016-10-16 MED ORDER — DILTIAZEM HCL ER COATED BEADS 120 MG PO CP24: 120.0000 mg | ORAL_CAPSULE | Freq: Every day | ORAL | 3 refills | Status: DC

## 2016-10-16 NOTE — Telephone Encounter (Signed)
Refilled diltiazem 120 mg. [place].

## 2016-11-30 ENCOUNTER — Other Ambulatory Visit: Payer: Self-pay | Admitting: Cardiovascular Disease

## 2016-12-28 ENCOUNTER — Other Ambulatory Visit: Payer: Self-pay | Admitting: Cardiovascular Disease

## 2017-02-28 DIAGNOSIS — H52221 Regular astigmatism, right eye: Secondary | ICD-10-CM | POA: Diagnosis not present

## 2017-02-28 DIAGNOSIS — H524 Presbyopia: Secondary | ICD-10-CM | POA: Diagnosis not present

## 2017-02-28 DIAGNOSIS — H18413 Arcus senilis, bilateral: Secondary | ICD-10-CM | POA: Diagnosis not present

## 2017-02-28 DIAGNOSIS — H5203 Hypermetropia, bilateral: Secondary | ICD-10-CM | POA: Diagnosis not present

## 2017-05-06 DIAGNOSIS — M79671 Pain in right foot: Secondary | ICD-10-CM | POA: Diagnosis not present

## 2017-05-06 DIAGNOSIS — M25571 Pain in right ankle and joints of right foot: Secondary | ICD-10-CM | POA: Diagnosis not present

## 2017-05-08 DIAGNOSIS — M25571 Pain in right ankle and joints of right foot: Secondary | ICD-10-CM | POA: Diagnosis not present

## 2017-05-08 DIAGNOSIS — M79671 Pain in right foot: Secondary | ICD-10-CM | POA: Diagnosis not present

## 2017-05-13 LAB — HEMOGLOBIN A1C: Hemoglobin A1C: 5.8

## 2017-06-20 ENCOUNTER — Ambulatory Visit (INDEPENDENT_AMBULATORY_CARE_PROVIDER_SITE_OTHER): Payer: PPO | Admitting: Cardiovascular Disease

## 2017-06-20 ENCOUNTER — Encounter: Payer: Self-pay | Admitting: *Deleted

## 2017-06-20 VITALS — BP 128/80 | HR 74 | Ht 71.0 in | Wt 269.0 lb

## 2017-06-20 DIAGNOSIS — I7789 Other specified disorders of arteries and arterioles: Secondary | ICD-10-CM | POA: Diagnosis not present

## 2017-06-20 DIAGNOSIS — Z7901 Long term (current) use of anticoagulants: Secondary | ICD-10-CM | POA: Diagnosis not present

## 2017-06-20 DIAGNOSIS — I4891 Unspecified atrial fibrillation: Secondary | ICD-10-CM

## 2017-06-20 DIAGNOSIS — I517 Cardiomegaly: Secondary | ICD-10-CM

## 2017-06-20 DIAGNOSIS — R0602 Shortness of breath: Secondary | ICD-10-CM

## 2017-06-20 DIAGNOSIS — I10 Essential (primary) hypertension: Secondary | ICD-10-CM

## 2017-06-20 DIAGNOSIS — G4733 Obstructive sleep apnea (adult) (pediatric): Secondary | ICD-10-CM

## 2017-06-20 NOTE — Progress Notes (Signed)
SUBJECTIVE: The patient presents follow-up of atrial fibrillation. He has sleep apnea as well and uses CPAP.  He denies chest pain. He has chronic exertional dyspnea which is stable. He seldom has palpitations. He denies lightheadedness, dizziness, and syncope. He also denies bleeding problems with Eliquis.  ECG performed in the office today which I ordered an personally interpreted demonstrated rate controlled atrial fibrillation, heart rate 84 bpm.   Review of Systems: As per "subjective", otherwise negative.  No Known Allergies  Current Outpatient Prescriptions  Medication Sig Dispense Refill  . diltiazem (CARDIZEM CD) 120 MG 24 hr capsule Take 1 capsule (120 mg total) by mouth daily. Take 2 tablets in the am (240 mg total) and take 1 tablet (120 mg total) in the pm 270 capsule 3  . ELIQUIS 5 MG TABS tablet TAKE ONE TABLET TWICE DAILY 60 tablet 11  . losartan (COZAAR) 50 MG tablet TAKE ONE (1) TABLET EACH DAY 90 tablet 3  . pantoprazole (PROTONIX) 40 MG tablet Take 1 tablet (40 mg total) by mouth daily. 30 tablet 11  . tamsulosin (FLOMAX) 0.4 MG CAPS capsule Take 0.4 mg by mouth daily.      No current facility-administered medications for this visit.     Past Medical History:  Diagnosis Date  . Atrial fibrillation ([place]) SEP 2015  . Helicobacter pylori gastritis Dec 2015   Treated with Pylera  . Hypertension   . Shortness of breath dyspnea   . Sleep apnea     Past Surgical History:  Procedure Laterality Date  . CARDIAC ELECTROPHYSIOLOGY STUDY AND ABLATION  2010?  . COLONOSCOPY  2005   SOLITARY RECTAL ULCER  . COLONOSCOPY N/A 09/06/2014   Dr. Oneida Alar: three colon polyps, moderate diverticulosis throughout the entire examined colon  . ESOPHAGOGASTRODUODENOSCOPY N/A 09/06/2014   Dr. Fields:moderate erosive gastritis, no Barrett's. +H.pylori gastritis, treated with Pylera  . HEMORRHOID BANDING N/A 09/06/2014   NO BANDING PERFORMED. NO INTERNAL HEMORRHOIDS IDENTIFIED     . UPPER GASTROINTESTINAL ENDOSCOPY  2005 RMR    Social History   Social History  . Marital status: Married    Spouse name: N/A  . Number of children: N/A  . Years of education: N/A   Occupational History  . Full time    Social History Main Topics  . Smoking status: Former Smoker    Packs/day: 0.50    Years: 15.00    Types: Cigarettes    Start date: 05/28/1967    Quit date: 05/27/1984  . Smokeless tobacco: Never Used  . Alcohol use 0.6 oz/week    1 Glasses of wine per week  . Drug use: No  . Sexual activity: Not on file   Other Topics Concern  . Not on file   Social History Narrative   Married-2 KIDS(AGE 67 AND 38).   No regular exercise     Vitals:   06/20/17 1502  BP: 128/80  Pulse: 74  SpO2: 98%  Weight: 269 lb (122 kg)  Height: 5\' 11"  (1.803 m)    Wt Readings from Last 3 Encounters:  06/20/17 269 lb (122 kg)  06/10/16 248 lb (112.5 kg)  12/01/15 249 lb (112.9 kg)     PHYSICAL EXAM General: NAD HEENT: Normal. Neck: No JVD, no thyromegaly. Lungs: Clear to auscultation bilaterally with normal respiratory effort. CV: Nondisplaced PMI.  Regular rate and irregular rhythm, normal S1/S2, no S3, no murmur. Trace bilateral pretibial and periankle edema.  No carotid bruit.   Abdomen: Firm,  protuberant.  Neurologic: Alert and oriented.  Psych: Normal affect. Skin: Normal. Musculoskeletal: No gross deformities.    ECG: Most recent ECG reviewed.   Labs: Lab Results  Component Value Date/Time   K 4.4 05/27/2014 09:45 AM   BUN 17 05/27/2014 09:45 AM   CREATININE 1.01 05/27/2014 09:45 AM   ALT 28 05/18/2009 12:00 AM   TSH 1.537 05/27/2014 09:45 AM   HGB 15.8 05/27/2014 09:45 AM     Lipids: No results found for: LDLCALC, LDLDIRECT, CHOL, TRIG, HDL     ASSESSMENT AND PLAN:  1. Persistent atrial fibrillation: Symptomatically stable. Heart rate is controlled on diltiazem 240 mg every morning and 120 mg every evening. Lexiscan Cardiolite stress test  effectively ruled out ischemic heart disease. CHADS-VASC score 2 (age, HTN) thus at moderate risk for CVA. For this reason, I will continue anticoagulation with Eliquis 5 mg twice daily given lack of ulcerative disease on prior EGD and no further bleeding problems.   2. Moderately severe obstructive sleep apnea: Compliant with CPAP.  3. Aortic root dilatation: Mildly dilated in 2010 with a diameter of 41 mm, normal by echocardiogram on 06/09/14.   4. Essential HTN: Controlled. No changes. Continue losartan 50 mg daily.  5. RV enlargement: Mild to moderately dilated, and likely due to years of untreated moderately severe obstructive sleep apnea. Weight loss strongly advised. Compliant with CPAP.     Disposition: Follow up 1 year.   Kate Sable, M.D., F.A.C.C.

## 2017-06-20 NOTE — Patient Instructions (Signed)
Medication Instructions: No change  Labwork: None due  Procedures/Testing: None due  Follow-Up: 1 year with Dr Bronson Ing  Any Additional Special Instructions Will Be Listed Below (If Applicable).     If you need a refill on your cardiac medications before your next appointment, please call your pharmacy.      Thank you for choosing [facility] !

## 2017-06-27 DIAGNOSIS — M79671 Pain in right foot: Secondary | ICD-10-CM | POA: Diagnosis not present

## 2017-06-27 DIAGNOSIS — M19071 Primary osteoarthritis, right ankle and foot: Secondary | ICD-10-CM | POA: Diagnosis not present

## 2017-07-30 DIAGNOSIS — Z23 Encounter for immunization: Secondary | ICD-10-CM | POA: Diagnosis not present

## 2017-08-29 DIAGNOSIS — G4733 Obstructive sleep apnea (adult) (pediatric): Secondary | ICD-10-CM | POA: Diagnosis not present

## 2017-09-17 DIAGNOSIS — G4733 Obstructive sleep apnea (adult) (pediatric): Secondary | ICD-10-CM | POA: Diagnosis not present

## 2017-10-18 ENCOUNTER — Other Ambulatory Visit: Payer: Self-pay | Admitting: Cardiovascular Disease

## 2017-12-04 ENCOUNTER — Other Ambulatory Visit: Payer: Self-pay | Admitting: Cardiovascular Disease

## 2018-04-16 DIAGNOSIS — C44321 Squamous cell carcinoma of skin of nose: Secondary | ICD-10-CM | POA: Diagnosis not present

## 2018-04-16 DIAGNOSIS — D492 Neoplasm of unspecified behavior of bone, soft tissue, and skin: Secondary | ICD-10-CM | POA: Diagnosis not present

## 2018-05-21 DIAGNOSIS — Z85828 Personal history of other malignant neoplasm of skin: Secondary | ICD-10-CM | POA: Diagnosis not present

## 2018-05-21 DIAGNOSIS — Z08 Encounter for follow-up examination after completed treatment for malignant neoplasm: Secondary | ICD-10-CM | POA: Diagnosis not present

## 2018-06-18 DIAGNOSIS — I10 Essential (primary) hypertension: Secondary | ICD-10-CM | POA: Diagnosis not present

## 2018-06-18 DIAGNOSIS — E669 Obesity, unspecified: Secondary | ICD-10-CM | POA: Diagnosis not present

## 2018-06-18 DIAGNOSIS — Z23 Encounter for immunization: Secondary | ICD-10-CM | POA: Diagnosis not present

## 2018-06-18 DIAGNOSIS — G4733 Obstructive sleep apnea (adult) (pediatric): Secondary | ICD-10-CM | POA: Diagnosis not present

## 2018-06-18 DIAGNOSIS — I482 Chronic atrial fibrillation: Secondary | ICD-10-CM | POA: Diagnosis not present

## 2018-06-22 DIAGNOSIS — K210 Gastro-esophageal reflux disease with esophagitis: Secondary | ICD-10-CM | POA: Diagnosis not present

## 2018-06-22 DIAGNOSIS — G4733 Obstructive sleep apnea (adult) (pediatric): Secondary | ICD-10-CM | POA: Diagnosis not present

## 2018-06-22 DIAGNOSIS — I10 Essential (primary) hypertension: Secondary | ICD-10-CM | POA: Diagnosis not present

## 2018-06-22 DIAGNOSIS — I482 Chronic atrial fibrillation: Secondary | ICD-10-CM | POA: Diagnosis not present

## 2018-06-22 LAB — CBC AND DIFFERENTIAL
HCT: 46 (ref 41–53)
Hemoglobin: 16 (ref 13.5–17.5)
Platelets: 283 (ref 150–399)
WBC: 7.2

## 2018-06-22 LAB — LIPID PANEL
Cholesterol: 206 — AB (ref 0–200)
HDL: 36 (ref 35–70)
LDL Cholesterol: 141
Triglycerides: 154 (ref 40–160)

## 2018-06-22 LAB — CBC: RBC: 4.94 (ref 3.87–5.11)

## 2018-06-22 LAB — BASIC METABOLIC PANEL
BUN: 19 (ref 4–21)
CO2: 23 — AB (ref 13–22)
Chloride: 104 (ref 99–108)
Creatinine: 1.1 (ref <=1.3)
Glucose: 119
Potassium: 4.4 (ref 3.4–5.3)
Sodium: 137 (ref 137–147)

## 2018-06-22 LAB — COMPREHENSIVE METABOLIC PANEL
Albumin: 4.1 (ref 3.5–5.0)
Calcium: 9 (ref 8.7–10.7)
GFR calc Af Amer: 79
GFR calc non Af Amer: 68
Globulin: 2.2

## 2018-06-22 LAB — TSH: TSH: 1.91 (ref <=5.90)

## 2018-06-22 LAB — PSA: PSA: 0.7

## 2018-07-16 ENCOUNTER — Telehealth: Payer: Self-pay | Admitting: Gastroenterology

## 2018-07-16 NOTE — Telephone Encounter (Signed)
NEEDS OPV, DX: H PYLORI GASTRITIS. NEEDS TO BE TESTED TO DOCUMENT ERADICATION.

## 2018-07-17 ENCOUNTER — Encounter: Payer: Self-pay | Admitting: Gastroenterology

## 2018-07-17 NOTE — Telephone Encounter (Signed)
PATIENT SCHEDULED  °

## 2018-07-29 ENCOUNTER — Encounter: Payer: Self-pay | Admitting: Cardiovascular Disease

## 2018-07-29 ENCOUNTER — Ambulatory Visit: Payer: PPO | Admitting: Cardiovascular Disease

## 2018-07-29 VITALS — BP 116/78 | HR 78 | Ht 70.0 in | Wt 254.0 lb

## 2018-07-29 DIAGNOSIS — I517 Cardiomegaly: Secondary | ICD-10-CM

## 2018-07-29 DIAGNOSIS — G4733 Obstructive sleep apnea (adult) (pediatric): Secondary | ICD-10-CM

## 2018-07-29 DIAGNOSIS — I4821 Permanent atrial fibrillation: Secondary | ICD-10-CM

## 2018-07-29 DIAGNOSIS — I10 Essential (primary) hypertension: Secondary | ICD-10-CM

## 2018-07-29 DIAGNOSIS — Z7901 Long term (current) use of anticoagulants: Secondary | ICD-10-CM

## 2018-07-29 NOTE — Patient Instructions (Signed)
Medication Instructions:  Your physician recommends that you continue on your current medications as directed. Please refer to the Current Medication list given to you today.  If you need a refill on your cardiac medications before your next appointment, please call your pharmacy.   Lab work: None If you have labs (blood work) drawn today and your tests are completely normal, you will receive your results only by: Marland Kitchen MyChart Message (if you have MyChart) OR . A paper copy in the mail If you have any lab test that is abnormal or we need to change your treatment, we will call you to review the results.  Testing/Procedures: NONE  Follow-Up: At [facility], you and your health needs are our priority.  As part of our continuing mission to provide you with exceptional heart care, we have created designated Provider Care Teams.  These Care Teams include your primary Cardiologist (physician) and Advanced Practice Providers (APPs -  Physician Assistants and Nurse Practitioners) who all work together to provide you with the care you need, when you need it. You will need a follow up appointment in 1 years.  Please call our office 2 months in advance to schedule this appointment.  You may see Kate Sable, MD or one of the following Advanced Practice Providers on your designated Care Team:   Bernerd Pho, PA-C [facility]) . Ermalinda Barrios, PA-C ([place])  Any Other Special Instructions Will Be Listed Below (If Applicable). none

## 2018-07-29 NOTE — Progress Notes (Signed)
SUBJECTIVE: The patient presents for annual follow-up of permanent atrial fibrillation.  He also has sleep apnea and uses CPAP.  The patient denies any symptoms of chest pain, palpitations, shortness of breath, lightheadedness, dizziness, leg swelling, orthopnea, PND, and syncope.  ECG performed in the office today demonstrates rate controlled atrial fibrillation.  He does not get any form of exercise including walking as he says his hips hurt.      Review of Systems: As per "subjective", otherwise negative.  No Known Allergies  Current Outpatient Medications  Medication Sig Dispense Refill  . CARTIA XT 120 MG 24 hr capsule TAKE TWO CAPSULES IN THE MORNING AND ONECAPSULE IN THE EVENING 270 capsule 3  . ELIQUIS 5 MG TABS tablet TAKE ONE TABLET TWICE DAILY 60 tablet 11  . losartan (COZAAR) 50 MG tablet TAKE ONE (1) TABLET EACH DAY 90 tablet 3  . pantoprazole (PROTONIX) 40 MG tablet Take 1 tablet (40 mg total) by mouth daily. 30 tablet 11   No current facility-administered medications for this visit.     Past Medical History:  Diagnosis Date  . Atrial fibrillation ([place]) SEP 2015  . Helicobacter pylori gastritis Dec 2015   Treated with Pylera  . Hypertension   . Shortness of breath dyspnea   . Sleep apnea     Past Surgical History:  Procedure Laterality Date  . CARDIAC ELECTROPHYSIOLOGY STUDY AND ABLATION  2010?  . COLONOSCOPY  2005   SOLITARY RECTAL ULCER  . COLONOSCOPY N/A 09/06/2014   Dr. Oneida Alar: three colon polyps, moderate diverticulosis throughout the entire examined colon  . ESOPHAGOGASTRODUODENOSCOPY N/A 09/06/2014   Dr. Fields:moderate erosive gastritis, no Barrett's. +H.pylori gastritis, treated with Pylera  . HEMORRHOID BANDING N/A 09/06/2014   NO BANDING PERFORMED. NO INTERNAL HEMORRHOIDS IDENTIFIED   . UPPER GASTROINTESTINAL ENDOSCOPY  2005 RMR    Social History   Socioeconomic History  . Marital status: Married    Spouse name: Not on file  .  Number of children: Not on file  . Years of education: Not on file  . Highest education level: Not on file  Occupational History  . Occupation: Full time  Social Needs  . Financial resource strain: Not on file  . Food insecurity:    Worry: Not on file    Inability: Not on file  . Transportation needs:    Medical: Not on file    Non-medical: Not on file  Tobacco Use  . Smoking status: Former Smoker    Packs/day: 0.50    Years: 15.00    Pack years: 7.50    Types: Cigarettes    Start date: 05/28/1967    Last attempt to quit: 05/27/1984    Years since quitting: 34.1  . Smokeless tobacco: Never Used  Substance and Sexual Activity  . Alcohol use: Yes    Alcohol/week: 1.0 standard drinks    Types: 1 Glasses of wine per week  . Drug use: No  . Sexual activity: Not on file  Lifestyle  . Physical activity:    Days per week: Not on file    Minutes per session: Not on file  . Stress: Not on file  Relationships  . Social connections:    Talks on phone: Not on file    Gets together: Not on file    Attends religious service: Not on file    Active member of club or organization: Not on file    Attends meetings of clubs or organizations: Not on  file    Relationship status: Not on file  . Intimate partner violence:    Fear of current or ex partner: Not on file    Emotionally abused: Not on file    Physically abused: Not on file    Forced sexual activity: Not on file  Other Topics Concern  . Not on file  Social History Narrative   Married-2 KIDS(AGE 54 AND 38).   No regular exercise     Vitals:   07/29/18 0815  BP: 116/78  Pulse: 78  SpO2: 98%  Weight: 254 lb (115.2 kg)  Height: 5\' 10"  (1.778 m)    Wt Readings from Last 3 Encounters:  07/29/18 254 lb (115.2 kg)  06/20/17 269 lb (122 kg)  06/10/16 248 lb (112.5 kg)     PHYSICAL EXAM General: NAD HEENT: Normal. Neck: No JVD, no thyromegaly. Lungs: Clear to auscultation bilaterally with normal respiratory effort. CV:  Regular rate and irregular rhythm, normal S1/S2, no S3, no murmur. No pretibial or periankle edema.  No carotid bruit.   Abdomen: Soft, nontender, obese.  Neurologic: Alert and oriented.  Psych: Normal affect. Skin: Normal. Musculoskeletal: No gross deformities.    ECG: Reviewed above under Subjective   Labs: Lab Results  Component Value Date/Time   K 4.4 05/27/2014 09:45 AM   BUN 17 05/27/2014 09:45 AM   CREATININE 1.01 05/27/2014 09:45 AM   ALT 28 05/18/2009 12:00 AM   TSH 1.537 05/27/2014 09:45 AM   HGB 15.8 05/27/2014 09:45 AM     Lipids: No results found for: LDLCALC, LDLDIRECT, CHOL, TRIG, HDL     ASSESSMENT AND PLAN:  1.  Permanent atrial fibrillation: Symptomatically stable.  Heart rate is controlled on long-acting diltiazem 240 mg every morning and 120 mg every evening.  Continue Eliquis for anticoagulation.  2.  Obstructive sleep apnea: Uses CPAP nightly.  3.  Hypertension: Controlled on present therapy.  Continue losartan 50 mg.  5. RV enlargement: Mild to moderately dilated, and likely due to years of untreated moderately severe obstructive sleep apnea. Weight loss previously advised. Compliant with CPAP.   Disposition: Follow up 1 year   Kate Sable, M.D., F.A.C.C.

## 2018-07-29 NOTE — Progress Notes (Signed)
ekg 

## 2018-09-07 ENCOUNTER — Other Ambulatory Visit: Payer: Self-pay

## 2018-09-07 NOTE — Patient Outreach (Signed)
[place] [facility]) Care Management  09/07/2018  Sean Golden 1946-07-21 [id number]   Telephone Screen  Referral Date: 09/07/18 Referral Source: Nurse Call Center Referral Reason: " 09/06/18-8:37am-caller states having sharp pain under left armpit, no other symptoms" Insurance: HTA   Outreach attempt #1 to patient. No answer at present. RN CM left HIPAA compliant voicemail message along with contact info.    Plan: RN CM will make outreach attempt to patient within 3-4 business days. RN CM will send unsuccessful outreach letter to patient.    [name], RN,BSN,CCM [place] Management Telephonic Care Management Coordinator Direct Phone: [phone] Toll Free: [phone] Fax: [phone]

## 2018-09-08 ENCOUNTER — Other Ambulatory Visit: Payer: Self-pay

## 2018-09-08 NOTE — Patient Outreach (Signed)
[place] [facility]) Care Management  09/08/2018  Sean Golden [birth year] [id number]   Telephone Screen  Referral Date: 09/07/18 Referral Source: Nurse Call Center Referral Reason: " 09/06/18-8:37am-caller states having sharp pain under left armpit, no other symptoms" Insurance: HTA   Outreach attempt #2 to patient. No answer at present.      Plan: RN CM will make outreach attempt to patient within 3-4 business days.    [name], RN,BSN,CCM [place] Management Telephonic Care Management Coordinator Direct Phone: [phone] Toll Free: [phone] Fax: [phone]

## 2018-09-09 ENCOUNTER — Other Ambulatory Visit: Payer: Self-pay

## 2018-09-09 NOTE — Patient Outreach (Signed)
[place] [facility]) Care Management  09/09/2018  Sean Golden 04-17-1946 [id number]   Telephone Screen  Referral Date:09/07/18 Referral Source:Nurse Call Center Referral Reason:" 09/06/18-8:37am-caller states having sharp pain under left armpit, no other symptoms" Insurance:HTA   Outreach attempt #3 to patient. No answer at present.     Plan: RN CM will close case if no response from letter mailed to patient.    [name], RN,BSN,CCM [place] Management Telephonic Care Management Coordinator Direct Phone: [phone] Toll Free: [phone] Fax: [phone]

## 2018-09-18 ENCOUNTER — Other Ambulatory Visit: Payer: Self-pay

## 2018-09-18 NOTE — Patient Outreach (Signed)
[place] [facility]) Care Management  09/18/2018  Sean Golden 30-Jul-1946 [id number]      Telephone Screen  Referral Date:09/07/18 Referral Source:Nurse Call Center Referral Reason:" 09/06/18-8:37am-caller states having sharp pain under left armpit, no other symptoms" Insurance:HTA    Multiple attempts to establish contact with patient without success. No response from letter mailed to patient. Case is being closed at this time.    Plan: RN CM will close case at this time.   [name], RN,BSN,CCM [place] Management Telephonic Care Management Coordinator Direct Phone: [phone] Toll Free: [phone] Fax: [phone]

## 2018-10-08 ENCOUNTER — Telehealth: Payer: Self-pay | Admitting: Gastroenterology

## 2018-10-08 NOTE — Telephone Encounter (Signed)
Forwarding to Dr.Fields.  

## 2018-10-08 NOTE — Telephone Encounter (Signed)
PLEASE CALL PT. HE WAS TREATED FOR F PYLORI INFECTION IN 2016. IT SHOULD BE DOCUMENTED THAT THE BACTERIA IS GONE OTHERWISE HE WILL STILL BE AT RISK FOR STOMACH CANCER. THE APPT WAS MADE TO DISCUSS THIS. HE MAY ALSO SEE HIS PCP TO HAVE THE H PYLORI BREATH TEST ORDERED.

## 2018-10-08 NOTE — Telephone Encounter (Signed)
Patient called to cancel his appointment, stated he did not know why he was scheduled. I received a patient note in October from [facility] that he needed to be scheduled for an office visit.

## 2018-10-08 NOTE — Telephone Encounter (Signed)
LMOM for a return call.  

## 2018-10-13 ENCOUNTER — Ambulatory Visit: Payer: PPO | Admitting: Gastroenterology

## 2018-10-13 NOTE — Telephone Encounter (Signed)
Letter mailed to pt to call.  

## 2018-10-24 ENCOUNTER — Other Ambulatory Visit: Payer: Self-pay | Admitting: Cardiovascular Disease

## 2018-12-01 ENCOUNTER — Other Ambulatory Visit: Payer: Self-pay | Admitting: Cardiovascular Disease

## 2018-12-01 DIAGNOSIS — G4733 Obstructive sleep apnea (adult) (pediatric): Secondary | ICD-10-CM | POA: Diagnosis not present

## 2019-01-04 ENCOUNTER — Other Ambulatory Visit: Payer: Self-pay | Admitting: Cardiovascular Disease

## 2019-04-26 ENCOUNTER — Other Ambulatory Visit: Payer: Self-pay

## 2019-08-09 DIAGNOSIS — I10 Essential (primary) hypertension: Secondary | ICD-10-CM | POA: Diagnosis not present

## 2019-08-09 DIAGNOSIS — G4733 Obstructive sleep apnea (adult) (pediatric): Secondary | ICD-10-CM | POA: Diagnosis not present

## 2019-08-09 DIAGNOSIS — N401 Enlarged prostate with lower urinary tract symptoms: Secondary | ICD-10-CM | POA: Diagnosis not present

## 2019-08-09 DIAGNOSIS — Z23 Encounter for immunization: Secondary | ICD-10-CM | POA: Diagnosis not present

## 2019-08-09 DIAGNOSIS — I482 Chronic atrial fibrillation, unspecified: Secondary | ICD-10-CM | POA: Diagnosis not present

## 2019-08-11 DIAGNOSIS — Z125 Encounter for screening for malignant neoplasm of prostate: Secondary | ICD-10-CM | POA: Diagnosis not present

## 2019-08-11 DIAGNOSIS — R739 Hyperglycemia, unspecified: Secondary | ICD-10-CM | POA: Diagnosis not present

## 2019-08-11 DIAGNOSIS — N401 Enlarged prostate with lower urinary tract symptoms: Secondary | ICD-10-CM | POA: Diagnosis not present

## 2019-08-11 DIAGNOSIS — G4733 Obstructive sleep apnea (adult) (pediatric): Secondary | ICD-10-CM | POA: Diagnosis not present

## 2019-08-11 DIAGNOSIS — I10 Essential (primary) hypertension: Secondary | ICD-10-CM | POA: Diagnosis not present

## 2019-08-11 LAB — BASIC METABOLIC PANEL
BUN: 13 (ref 4–21)
BUN: 13 (ref 4–21)
CO2: 21 (ref 13–22)
Chloride: 106 (ref 99–108)
Creatinine: 1 (ref 0.6–1.3)
Creatinine: 1 (ref 0.6–1.3)
Glucose: 129
Potassium: 4.2 (ref 3.4–5.3)
Sodium: 137 (ref 137–147)

## 2019-08-11 LAB — COMPREHENSIVE METABOLIC PANEL
Albumin: 4.3 (ref 3.5–5.0)
Calcium: 8.9 (ref 8.7–10.7)
GFR calc Af Amer: 89
GFR calc non Af Amer: 77
Globulin: 2.3

## 2019-08-11 LAB — CBC AND DIFFERENTIAL
HCT: 48 (ref 41–53)
Hemoglobin: 16.9 (ref 13.5–17.5)
Neutrophils Absolute: 4726
Platelets: 314 (ref 150–399)
WBC: 8.5

## 2019-08-11 LAB — HEPATIC FUNCTION PANEL
ALT: 26 (ref 10–40)
AST: 21 (ref 14–40)
Alkaline Phosphatase: 71 (ref 25–125)
Bilirubin, Total: 0.6

## 2019-08-11 LAB — HEMOGLOBIN A1C: Hemoglobin A1C: 5.9

## 2019-08-11 LAB — TSH: TSH: 2.16 (ref 0.41–5.90)

## 2019-08-11 LAB — PSA: PSA: 0.5

## 2019-08-11 LAB — CBC: RBC: 5.15 — AB (ref 3.87–5.11)

## 2019-08-14 LAB — LIPID PANEL
Cholesterol: 205 — AB (ref 0–200)
HDL: 45 (ref 35–70)
LDL Cholesterol: 136
LDl/HDL Ratio: 4.6
Triglycerides: 120 (ref 40–160)

## 2019-08-24 DIAGNOSIS — Z1211 Encounter for screening for malignant neoplasm of colon: Secondary | ICD-10-CM | POA: Diagnosis not present

## 2019-08-24 DIAGNOSIS — Z1212 Encounter for screening for malignant neoplasm of rectum: Secondary | ICD-10-CM | POA: Diagnosis not present

## 2019-09-02 ENCOUNTER — Encounter: Payer: Self-pay | Admitting: Gastroenterology

## 2019-09-09 ENCOUNTER — Encounter: Payer: Self-pay | Admitting: Cardiovascular Disease

## 2019-09-09 ENCOUNTER — Other Ambulatory Visit: Payer: Self-pay

## 2019-09-09 ENCOUNTER — Ambulatory Visit: Payer: PPO | Admitting: Cardiovascular Disease

## 2019-09-09 VITALS — BP 143/86 | HR 89 | Temp 97.8°F | Ht 70.5 in | Wt 265.0 lb

## 2019-09-09 DIAGNOSIS — Z7901 Long term (current) use of anticoagulants: Secondary | ICD-10-CM

## 2019-09-09 DIAGNOSIS — G4733 Obstructive sleep apnea (adult) (pediatric): Secondary | ICD-10-CM

## 2019-09-09 DIAGNOSIS — I517 Cardiomegaly: Secondary | ICD-10-CM

## 2019-09-09 DIAGNOSIS — I10 Essential (primary) hypertension: Secondary | ICD-10-CM | POA: Diagnosis not present

## 2019-09-09 DIAGNOSIS — I4821 Permanent atrial fibrillation: Secondary | ICD-10-CM

## 2019-09-09 MED ORDER — DILTIAZEM HCL ER COATED BEADS 120 MG PO CP24: ORAL_CAPSULE | ORAL | 3 refills | Status: DC

## 2019-09-09 NOTE — Progress Notes (Signed)
SUBJECTIVE: The patient presents for annual follow-up of permanent atrial fibrillation.  He also has sleep apnea and uses CPAP.  The patient denies any symptoms of chest pain, palpitations, shortness of breath, lightheadedness, dizziness, leg swelling, orthopnea, PND, and syncope.  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  He celebrated his 73st birthday yesterday.  Review of Systems: As per "subjective", otherwise negative.  No Known Allergies  Current Outpatient Medications  Medication Sig Dispense Refill  . diltiazem (CARDIZEM CD) 120 MG 24 hr capsule TAKE TWO CAPSULES BY MOUTH IN THE MORNING AND ONE CAPSULE IN THE EVENING 270 capsule 3  . ELIQUIS 5 MG TABS tablet TAKE ONE TABLET TWICE DAILY 60 tablet 11  . hydrocortisone (ANUSOL-HC) 25 MG suppository Place 25 mg rectally 2 (two) times daily.    Marland Kitchen losartan (COZAAR) 100 MG tablet Take 100 mg by mouth daily.    . meloxicam (MOBIC) 7.5 MG tablet Take 7.5 mg by mouth 2 (two) times daily.    . pantoprazole (PROTONIX) 40 MG tablet Take 1 tablet (40 mg total) by mouth daily. 30 tablet 11  . tamsulosin (FLOMAX) 0.4 MG CAPS capsule Take 0.4 mg by mouth daily.     No current facility-administered medications for this visit.    Past Medical History:  Diagnosis Date  . Atrial fibrillation ([place]) SEP 2015  . Essential (primary) hypertension   . Gastro-esophageal reflux disease with esophagitis   . Helicobacter pylori gastritis Dec 2015   Treated with Pylera  . Hypertension   . Obstructive sleep apnea (adult) (pediatric)   . Shortness of breath dyspnea   . Sleep apnea     Past Surgical History:  Procedure Laterality Date  . CARDIAC ELECTROPHYSIOLOGY STUDY AND ABLATION  2010?  . COLONOSCOPY  2005   SOLITARY RECTAL ULCER  . COLONOSCOPY N/A 09/06/2014   Dr. Oneida Alar: three colon polyps, moderate diverticulosis throughout the entire examined colon  .  ESOPHAGOGASTRODUODENOSCOPY N/A 09/06/2014   Dr. Fields:moderate erosive gastritis, no Barrett's. +H.pylori gastritis, treated with Pylera  . HEMORRHOID BANDING N/A 09/06/2014   NO BANDING PERFORMED. NO INTERNAL HEMORRHOIDS IDENTIFIED   . UPPER GASTROINTESTINAL ENDOSCOPY  2005 RMR    Social History   Socioeconomic History  . Marital status: Married    Spouse name: Not on file  . Number of children: Not on file  . Years of education: Not on file  . Highest education level: Not on file  Occupational History  . Occupation: Full time  Tobacco Use  . Smoking status: Former Smoker    Packs/day: 0.50    Years: 15.00    Pack years: 7.50    Types: Cigarettes    Start date: 05/28/1967    Quit date: 05/27/1984    Years since quitting: 35.3  . Smokeless tobacco: Never Used  Substance and Sexual Activity  . Alcohol use: Yes    Alcohol/week: 1.0 standard drinks    Types: 1 Glasses of wine per week  . Drug use: No  . Sexual activity: Not on file  Other Topics Concern  . Not on file  Social History Narrative   Married-2 KIDS(AGE 28 AND 38).   No regular exercise   Social Determinants of Health   Financial Resource Strain:   . Difficulty of Paying Living Expenses: Not on file  Food Insecurity:   . Worried About Charity fundraiser in the Last Year: Not on  file  . [place] in the Last Year: Not on file  Transportation Needs:   . Lack of Transportation (Medical): Not on file  . Lack of Transportation (Non-Medical): Not on file  Physical Activity:   . Days of Exercise per Week: Not on file  . Minutes of Exercise per Session: Not on file  Stress:   . Feeling of Stress : Not on file  Social Connections:   . Frequency of Communication with Friends and Family: Not on file  . Frequency of Social Gatherings with Friends and Family: Not on file  . Attends Religious Services: Not on file  . Active Member of Clubs or Organizations: Not on file  . Attends Archivist Meetings:  Not on file  . Marital Status: Not on file  Intimate Partner Violence:   . Fear of Current or Ex-Partner: Not on file  . Emotionally Abused: Not on file  . Physically Abused: Not on file  . Sexually Abused: Not on file     Vitals:   09/09/19 1034  BP: (!) 143/86  Pulse: 89  Temp: 97.8 F (36.6 C)  SpO2: 97%  Weight: 265 lb (120.2 kg)  Height: 5' 10.5" (1.791 m)    Wt Readings from Last 3 Encounters:  09/09/19 265 lb (120.2 kg)  07/29/18 254 lb (115.2 kg)  06/20/17 269 lb (122 kg)     PHYSICAL EXAM General: NAD HEENT: Normal. Neck: No JVD, no thyromegaly. Lungs: Clear to auscultation bilaterally with normal respiratory effort. CV: Regular rate and irregular rhythm, normal S1/S2, no S3, no murmur. No pretibial or periankle edema.  No carotid bruit.   Abdomen: Soft, nontender, no distention.  Neurologic: Alert and oriented.  Psych: Normal affect. Skin: Normal. Musculoskeletal: No gross deformities.      Labs: Lab Results  Component Value Date/Time   K 4.4 06/22/2018 12:00 AM   BUN 19 06/22/2018 12:00 AM   CREATININE 1.1 06/22/2018 12:00 AM   CREATININE 1.01 05/27/2014 09:45 AM   ALT 28 05/18/2009 12:00 AM   TSH 1.91 06/22/2018 12:00 AM   TSH 1.537 05/27/2014 09:45 AM   HGB 16.0 06/22/2018 12:00 AM     Lipids: Lab Results  Component Value Date/Time   LDLCALC 141 06/22/2018 12:00 AM   CHOL 206 (A) 06/22/2018 12:00 AM   TRIG 154 06/22/2018 12:00 AM   HDL 36 06/22/2018 12:00 AM       ASSESSMENT AND PLAN: 1.  Permanent atrial fibrillation: Symptomatically stable.  Heart rate is controlled on long-acting diltiazem 240 mg every morning and 120 mg every evening.  Continue Eliquis for anticoagulation.  2.  Obstructive sleep apnea: Uses CPAP nightly.  3.  Hypertension: Mildly elevated today. Continue losartan 100 mg and Cardizem CD.  5. RV enlargement: Mild to moderately dilated, and likely due to years of untreated moderately severe obstructive sleep  apnea. Weight loss previously advised. Compliant with CPAP.      Disposition: Follow up 1 year   Kate Sable, M.D., F.A.C.C.

## 2019-09-09 NOTE — Patient Instructions (Signed)
Medication Instructions:  Your physician recommends that you continue on your current medications as directed. Please refer to the Current Medication list given to you today.  *If you need a refill on your cardiac medications before your next appointment, please call your pharmacy*  Lab Work: None today If you have labs (blood work) drawn today and your tests are completely normal, you will receive your results only by: . MyChart Message (if you have MyChart) OR . A paper copy in the mail If you have any lab test that is abnormal or we need to change your treatment, we will call you to review the results.  Testing/Procedures: None today  Follow-Up: At [facility], you and your health needs are our priority.  As part of our continuing mission to provide you with exceptional heart care, we have created designated Provider Care Teams.  These Care Teams include your primary Cardiologist (physician) and Advanced Practice Providers (APPs -  Physician Assistants and Nurse Practitioners) who all work together to provide you with the care you need, when you need it.  Your next appointment:   12 month(s)  The format for your next appointment:   Virtual Visit   Provider:   Suresh Koneswaran, MD  Other Instructions None     Thank you for choosing [facility] !         

## 2019-10-27 ENCOUNTER — Encounter: Payer: Self-pay | Admitting: Family Medicine

## 2019-10-27 ENCOUNTER — Ambulatory Visit (INDEPENDENT_AMBULATORY_CARE_PROVIDER_SITE_OTHER): Payer: PPO | Admitting: Family Medicine

## 2019-10-27 ENCOUNTER — Other Ambulatory Visit: Payer: Self-pay

## 2019-10-27 VITALS — BP 134/84 | HR 85 | Temp 97.7°F | Ht 71.0 in | Wt 265.6 lb

## 2019-10-27 DIAGNOSIS — K219 Gastro-esophageal reflux disease without esophagitis: Secondary | ICD-10-CM

## 2019-10-27 DIAGNOSIS — R0600 Dyspnea, unspecified: Secondary | ICD-10-CM | POA: Diagnosis not present

## 2019-10-27 DIAGNOSIS — I4819 Other persistent atrial fibrillation: Secondary | ICD-10-CM

## 2019-10-27 DIAGNOSIS — I10 Essential (primary) hypertension: Secondary | ICD-10-CM

## 2019-10-27 DIAGNOSIS — R7309 Other abnormal glucose: Secondary | ICD-10-CM

## 2019-10-27 DIAGNOSIS — R0609 Other forms of dyspnea: Secondary | ICD-10-CM | POA: Insufficient documentation

## 2019-10-27 DIAGNOSIS — R0602 Shortness of breath: Secondary | ICD-10-CM | POA: Insufficient documentation

## 2019-10-27 DIAGNOSIS — J9601 Acute respiratory failure with hypoxia: Secondary | ICD-10-CM | POA: Insufficient documentation

## 2019-10-27 NOTE — Patient Instructions (Addendum)
COVID-19 Vaccine Information can be found at: [email or web address] For questions related to vaccine distribution or appointments, please email [email or web address] or call [phone].   Cardio referral-appt tomorrow

## 2019-10-27 NOTE — Progress Notes (Signed)
New Patient Office Visit  Subjective:  Patient ID: Sean Golden, male    DOB: Jul 11, 1946  Age: 74 y.o. MRN: [id number]  CC:  Chief Complaint  Patient presents with  . Establish Care  . Shortness of Breath    upon excertion    HPI Sean Golden presents for pt with no palpitations.  Pt seen by cardiology in 12/20 with no echo completed-h/o a flutter/afib LE edema, no increase. Pt with no SOB at rest.  Pt states walking can cause SOB.  No n/v/, No CP, no dizziness , no headache GERD-well controlled with protonix Past Medical History:  Diagnosis Date  . Atrial fibrillation ([name]) SEP 2015  . Essential (primary) hypertension   . Gastro-esophageal reflux disease with esophagitis   . Helicobacter pylori gastritis Dec 2015   Treated with Pylera  . Hypertension   . Obstructive sleep apnea (adult) (pediatric)   . Shortness of breath dyspnea   . Sleep apnea     Past Surgical History:  Procedure Laterality Date  . CARDIAC ELECTROPHYSIOLOGY STUDY AND ABLATION  2010?  . COLONOSCOPY  2005   SOLITARY RECTAL ULCER  . COLONOSCOPY N/A 09/06/2014   Dr. Oneida Alar: three colon polyps, moderate diverticulosis throughout the entire examined colon  . ESOPHAGOGASTRODUODENOSCOPY N/A 09/06/2014   Dr. Fields:moderate erosive gastritis, no Barrett's. +H.pylori gastritis, treated with Pylera  . HEMORRHOID BANDING N/A 09/06/2014   NO BANDING PERFORMED. NO INTERNAL HEMORRHOIDS IDENTIFIED   . UPPER GASTROINTESTINAL ENDOSCOPY  2005 RMR  . VASECTOMY      Family History  Problem Relation Age of Onset  . Hypertension Mother   . Heart attack Father   . Hyperlipidemia Sister   . Hyperlipidemia Brother   . Colon polyps Brother   . Hypertension Brother   . Hypertension Sister   . Diabetes Brother   . Stroke Other   . Diabetes Other   . Hypertension Other   . Hyperlipidemia Other   . Colon cancer Neg Hx     Social History   Socioeconomic History  . Marital status: Married    Spouse name:  Not on file  . Number of children: Not on file  . Years of education: Not on file  . Highest education level: Not on file  Occupational History  . Occupation: Full time  Tobacco Use  . Smoking status: Former Smoker    Packs/day: 0.50    Years: 15.00    Pack years: 7.50    Types: Cigarettes    Start date: 05/28/1967    Quit date: 05/27/1984    Years since quitting: 35.4  . Smokeless tobacco: Never Used  Substance and Sexual Activity  . Alcohol use: Yes    Alcohol/week: 1.0 standard drinks    Types: 1 Glasses of wine per week  . Drug use: No  . Sexual activity: Not Currently  Other Topics Concern  . Not on file  Social History Narrative   Married-2 KIDS(AGE 73 AND 38).   No regular exercise   Social Determinants of Health   Financial Resource Strain:   . Difficulty of Paying Living Expenses: Not on file  Food Insecurity:   . Worried About Charity fundraiser in the Last Year: Not on file  . Ran Out of Food in the Last Year: Not on file  Transportation Needs:   . Lack of Transportation (Medical): Not on file  . Lack of Transportation (Non-Medical): Not on file  Physical Activity:   . Days of  Exercise per Week: Not on file  . Minutes of Exercise per Session: Not on file  Stress:   . Feeling of Stress : Not on file  Social Connections:   . Frequency of Communication with Friends and Family: Not on file  . Frequency of Social Gatherings with Friends and Family: Not on file  . Attends Religious Services: Not on file  . Active Member of Clubs or Organizations: Not on file  . Attends Archivist Meetings: Not on file  . Marital Status: Not on file  Intimate Partner Violence:   . Fear of Current or Ex-Partner: Not on file  . Emotionally Abused: Not on file  . Physically Abused: Not on file  . Sexually Abused: Not on file    ROS Review of Systems  Constitutional: Negative for fatigue and fever.  HENT: Negative for congestion.   Respiratory: Positive for apnea  and shortness of breath. Negative for cough, choking, chest tightness and wheezing.        Wears CPAP  Cardiovascular: Negative for chest pain, palpitations and leg swelling.  Gastrointestinal:       GERD   Musculoskeletal: Positive for arthralgias.       Hips/knees  Neurological: Negative for dizziness, weakness and headaches.  Hematological: Negative.   Psychiatric/Behavioral: Negative.     Objective:   Today's Vitals: BP 134/84 (BP Location: Left Arm, Patient Position: Sitting, Cuff Size: Normal)   Pulse 85   Temp 97.7 F (36.5 C) (Oral)   Ht 5\' 11"  (1.803 m)   Wt 265 lb 9.6 oz (120.5 kg)   SpO2 (!) 56%   BMI 37.04 kg/m   Physical Exam Constitutional:      Appearance: He is well-developed. He is obese.  HENT:     Head: Normocephalic and atraumatic.  Cardiovascular:     Rate and Rhythm: Normal rate and regular rhythm.     Pulses: Normal pulses.  Pulmonary:     Effort: Pulmonary effort is normal.     Breath sounds: Normal breath sounds.  Musculoskeletal:     Cervical back: Normal range of motion and neck supple.     Right lower leg: Edema present.     Left lower leg: Edema present.     Comments: 2+pitting -mid tibia  Neurological:     Mental Status: He is alert and oriented to person, place, and time.  Psychiatric:        Mood and Affect: Mood normal.        Behavior: Behavior normal.     Assessment & Plan:  1. Persistent atrial fibrillation ([place]) Concern for DOE-worsening-no recent echo, ecg flutter vs fibrillation-LE edema-worsening-concern for worsening SOB with LE edema - Ambulatory referral to Cardiology  2. Essential hypertension Cozaar-pt on 50mg  Cozaar + Cardizem 120mg  2am and 1pm Cardio following  3. Gastroesophageal reflux disease without esophagitis protonix-stable  4. DOE (dyspnea on exertion) Concern for worsening symptoms-no h/o CHF-no recent echo,  LE edema  5. Elevated glucose 5.8%-pt declines low carb diet-pt declines nutritional  referral  Outpatient Encounter Medications as of 10/27/2019  Medication Sig  . ELIQUIS 5 MG TABS tablet TAKE ONE TABLET TWICE DAILY  . hydrocortisone (ANUSOL-HC) 25 MG suppository Place 25 mg rectally 2 (two) times daily.  Marland Kitchen losartan (COZAAR) 50 MG tablet Take 50 mg by mouth daily.  . pantoprazole (PROTONIX) 40 MG tablet Take 1 tablet (40 mg total) by mouth daily.  . tamsulosin (FLOMAX) 0.4 MG CAPS capsule Take 0.4 mg by mouth  daily.  . [DISCONTINUED] diltiazem (CARDIZEM CD) 120 MG 24 hr capsule TAKE TWO CAPSULES BY MOUTH IN THE MORNING AND ONE CAPSULE IN THE EVENING (Patient not taking: Reported on 10/27/2019)  . [DISCONTINUED] losartan (COZAAR) 100 MG tablet Take 100 mg by mouth daily.  . [DISCONTINUED] meloxicam (MOBIC) 7.5 MG tablet Take 7.5 mg by mouth 2 (two) times daily.   No facility-administered encounter medications on file as of 10/27/2019.  21 minutes-review old records-cardiology, primary care, old ecg, old echo, pt history, physical exam , ecg, assessment and plan Follow-up: cardiology -DOE   Hannah Beat, MD

## 2019-10-28 ENCOUNTER — Ambulatory Visit (INDEPENDENT_AMBULATORY_CARE_PROVIDER_SITE_OTHER): Payer: PPO | Admitting: Student

## 2019-10-28 ENCOUNTER — Encounter: Payer: Self-pay | Admitting: Student

## 2019-10-28 VITALS — BP 128/68 | HR 81 | Temp 97.9°F | Ht 71.0 in | Wt 268.0 lb

## 2019-10-28 DIAGNOSIS — G4733 Obstructive sleep apnea (adult) (pediatric): Secondary | ICD-10-CM | POA: Diagnosis not present

## 2019-10-28 DIAGNOSIS — R600 Localized edema: Secondary | ICD-10-CM | POA: Diagnosis not present

## 2019-10-28 DIAGNOSIS — R0609 Other forms of dyspnea: Secondary | ICD-10-CM

## 2019-10-28 DIAGNOSIS — I4821 Permanent atrial fibrillation: Secondary | ICD-10-CM

## 2019-10-28 DIAGNOSIS — I10 Essential (primary) hypertension: Secondary | ICD-10-CM | POA: Diagnosis not present

## 2019-10-28 DIAGNOSIS — R0600 Dyspnea, unspecified: Secondary | ICD-10-CM

## 2019-10-28 MED ORDER — DILTIAZEM HCL ER COATED BEADS 120 MG PO CP24: 120.0000 mg | ORAL_CAPSULE | Freq: Every day | ORAL | Status: DC

## 2019-10-28 MED ORDER — DILTIAZEM HCL ER COATED BEADS 120 MG PO CP24: ORAL_CAPSULE | ORAL | Status: DC

## 2019-10-28 NOTE — Patient Instructions (Signed)

## 2019-10-28 NOTE — Progress Notes (Signed)
Cardiology Office Note    Date:  10/28/2019   ID:  Sean Golden 05/19/1946, MRN [id number]  PCP:  Maryruth Hancock, MD  Cardiologist: Kate Sable, MD    Chief Complaint  Patient presents with  . Follow-up    dyspnea on exertion    History of Present Illness:    Sean Golden is a 74 y.o. male with past medical history of permanent atrial fibrillation, HTN, OSA and GERD who presents to the office today for evaluation of dyspnea on exertion.  He was last examined by Dr. Bronson Ing in 08/2019 and denied any recent chest pain or dyspnea on exertion at that time. Weight was stable at 265 lbs and he was continued on his current medication regimen with Cardizem CD 240mg  in AM/120mg  in PM, Eliquis 5mg  BID, and Losartan 100mg  daily.   He was examined by his PCP yesterday and reported worsening shortness of breath with exertion along with lower extremity edema. EKG showed rate-controlled atrial fibrillation, heart rate 79. He was referred back to Cardiology for further recommendations.  In talking with the patient today, he reports having stable dyspnea on exertion for the past few years and is unaware of any acute change in his symptoms. He denies any associated chest pain or palpitations. He is overall unaware of his arrhythmia. He does report good compliance with his CPAP at night and denies any orthopnea or PND.  He does experience intermittent lower extremity edema and says he was wearing tight socks when evaluated by his PCP and feels like this might have contributed to his worsening symptoms at that time. Weight has overall been stable on his home scales.  Past Medical History:  Diagnosis Date  . Atrial fibrillation ([place]) SEP 2015  . Essential (primary) hypertension   . Gastro-esophageal reflux disease with esophagitis   . Helicobacter pylori gastritis Dec 2015   Treated with Pylera  . Hypertension   . Obstructive sleep apnea (adult) (pediatric)   . Shortness of breath  dyspnea   . Sleep apnea     Past Surgical History:  Procedure Laterality Date  . CARDIAC ELECTROPHYSIOLOGY STUDY AND ABLATION  2010?  . COLONOSCOPY  2005   SOLITARY RECTAL ULCER  . COLONOSCOPY N/A 09/06/2014   Dr. Oneida Alar: three colon polyps, moderate diverticulosis throughout the entire examined colon  . ESOPHAGOGASTRODUODENOSCOPY N/A 09/06/2014   Dr. Fields:moderate erosive gastritis, no Barrett's. +H.pylori gastritis, treated with Pylera  . HEMORRHOID BANDING N/A 09/06/2014   NO BANDING PERFORMED. NO INTERNAL HEMORRHOIDS IDENTIFIED   . UPPER GASTROINTESTINAL ENDOSCOPY  2005 RMR  . VASECTOMY      Current Medications: Outpatient Medications Prior to Visit  Medication Sig Dispense Refill  . ELIQUIS 5 MG TABS tablet TAKE ONE TABLET TWICE DAILY 60 tablet 11  . losartan (COZAAR) 50 MG tablet Take 50 mg by mouth daily.    . pantoprazole (PROTONIX) 40 MG tablet Take 1 tablet (40 mg total) by mouth daily. 30 tablet 11  . tamsulosin (FLOMAX) 0.4 MG CAPS capsule Take 0.4 mg by mouth daily.    . hydrocortisone (ANUSOL-HC) 25 MG suppository Place 25 mg rectally 2 (two) times daily.     No facility-administered medications prior to visit.     Allergies:   Patient has no known allergies.   Social History   Socioeconomic History  . Marital status: Married    Spouse name: Not on file  . Number of children: Not on file  . Years of education: Not  on file  . Highest education level: Not on file  Occupational History  . Occupation: Full time  Tobacco Use  . Smoking status: Former Smoker    Packs/day: 0.50    Years: 15.00    Pack years: 7.50    Types: Cigarettes    Start date: 05/28/1967    Quit date: 05/27/1984    Years since quitting: 35.4  . Smokeless tobacco: Never Used  Substance and Sexual Activity  . Alcohol use: Yes    Alcohol/week: 1.0 standard drinks    Types: 1 Glasses of wine per week  . Drug use: No  . Sexual activity: Not Currently  Other Topics Concern  . Not on  file  Social History Narrative   Married-2 KIDS(AGE 68 AND 38).   No regular exercise   Social Determinants of Health   Financial Resource Strain:   . Difficulty of Paying Living Expenses: Not on file  Food Insecurity:   . Worried About Charity fundraiser in the Last Year: Not on file  . Ran Out of Food in the Last Year: Not on file  Transportation Needs:   . Lack of Transportation (Medical): Not on file  . Lack of Transportation (Non-Medical): Not on file  Physical Activity:   . Days of Exercise per Week: Not on file  . Minutes of Exercise per Session: Not on file  Stress:   . Feeling of Stress : Not on file  Social Connections:   . Frequency of Communication with Friends and Family: Not on file  . Frequency of Social Gatherings with Friends and Family: Not on file  . Attends Religious Services: Not on file  . Active Member of Clubs or Organizations: Not on file  . Attends Archivist Meetings: Not on file  . Marital Status: Not on file     Family History:  The patient's family history includes Colon polyps in his brother; Diabetes in his brother and another family member; Heart attack in his father; Hyperlipidemia in his brother, sister, and another family member; Hypertension in his brother, mother, sister, and another family member; Stroke in an other family member.   Review of Systems:   Please see the history of present illness.     General:  No chills, fever, night sweats or weight changes.  Cardiovascular:  No chest pain, orthopnea, palpitations, paroxysmal nocturnal dyspnea. Positive for dyspnea on exertion and edema.  Dermatological: No rash, lesions/masses Respiratory: No cough, dyspnea Urologic: No hematuria, dysuria Abdominal:   No nausea, vomiting, diarrhea, bright red blood per rectum, melena, or hematemesis Neurologic:  No visual changes, wkns, changes in mental status. All other systems reviewed and are otherwise negative except as noted  above.   Physical Exam:    VS:  BP 128/68   Pulse 81   Temp 97.9 F (36.6 C) (Temporal)   Ht 5\' 11"  (1.803 m)   Wt 268 lb (121.6 kg)   SpO2 98%   BMI 37.38 kg/m    General: Well developed, well nourished,male appearing in no acute distress. Head: Normocephalic, atraumatic, sclera non-icteric, no xanthomas, nares are without discharge.  Neck: No carotid bruits. JVD not elevated.  Lungs: Respirations regular and unlabored, without wheezes or rales.  Heart: Irregularly irregular. No S3 or S4.  No murmur, no rubs, or gallops appreciated. Abdomen: Soft, non-tender, non-distended with normoactive bowel sounds. No hepatomegaly. No rebound/guarding. No obvious abdominal masses. Msk:  Strength and tone appear normal for age. No joint deformities or effusions.  Extremities: No clubbing or cyanosis. 1+ pitting edema up to mid-shins.  Distal pedal pulses are 2+ bilaterally. Neuro: Alert and oriented X 3. Moves all extremities spontaneously. No focal deficits noted. Psych:  Responds to questions appropriately with a normal affect. Skin: No rashes or lesions noted  Wt Readings from Last 3 Encounters:  10/28/19 268 lb (121.6 kg)  10/27/19 265 lb 9.6 oz (120.5 kg)  09/09/19 265 lb (120.2 kg)     Studies/Labs Reviewed:   EKG:  EKG is not ordered today. EKG from 10/27/2019 is reviewed which shows rate-controlled atrial fibrillation, HR 79 with no acute ST abnormalities when compared to prior tracings.   Recent Labs: No results found for requested labs within last 8760 hours.   Lipid Panel    Component Value Date/Time   CHOL 206 (A) 06/22/2018 0000   TRIG 154 06/22/2018 0000   HDL 36 06/22/2018 0000   LDLCALC 141 06/22/2018 0000    Additional studies/ records that were reviewed today include:   Echocardiogram: 05/2014 Study Conclusions   - Procedure narrative: Transthoracic echocardiography. Image  quality was suboptimal. The study was technically difficult, as a  result of poor  sound wave transmission and body habitus.  - Left ventricle: The cavity size was normal. Wall thickness was  increased in a pattern of moderate LVH. Systolic function was  normal. The estimated ejection fraction was in the range of 60%  to 65%. Wall motion was normal; there were no regional wall  motion abnormalities. The study was not technically sufficient to  allow evaluation of LV diastolic dysfunction due to atrial  fibrillation.  - Mitral valve: There was trivial regurgitation.  - Left atrium: The atrium was mildly dilated.  - Right ventricle: The cavity size was mildly to moderately  dilated.  - Right atrium: The atrium was mildly dilated.  - Tricuspid valve: There was trivial regurgitation.  - Pericardium, extracardiac: A trivial pericardial effusion was  identified.   Assessment:    1. Permanent atrial fibrillation ([place])   2. Dyspnea on exertion   3. Bilateral lower extremity edema   4. Essential hypertension   5. OSA (obstructive sleep apnea)      Plan:   In order of problems listed above:  1. Permanent Atrial Fibrillation - He denies any recent palpitations and heart rate has been well controlled when checked at home. Heart rate is in the 80's during today's visit. Continue current regimen with Cardizem CD 240mg  in AM/120mg  in PM.  - He denies any evidence of active bleeding. Remains on Eliquis 5 mg twice daily for anticoagulation. He did have labs by Dr. Luan Pulling last year and was informed everything was "normal". Unable to request records given his retirement. Would recommend a CBC and BMET with his next routine labs given the use of anticoagulation. If not obtained by PCP in the interim, will obtain at his next visit.   2. Dyspnea on Exertion/Lower Extremity Edema - he reports stable dyspnea on exertion for years and denies any associated chest pain or palpitations. He does have 1+ pitting edema on examination but lungs are clear.  - reviewed  obtaining an updated echocardiogram but he wishes to hold off for now given the stability of his symptoms. I encouraged him to make [place] aware if this changes.  - In regards to his edema, he had difficulty with compression stockings in the past. In reviewing his dietary intake, he adds salt to most meals. Recommended reducing sodium intake as a conservative  first approach. If symptoms do not improve with this, would use PRN Lasix 20mg  daily but he wishes to hold off on this unless absolutely necessary.   3. HTN - BP well-controlled at 128/68 during today's visit. Continue Cardizem CD 240mg  in AM/120mg  in PM and Losartan 50mg  daily.   4. OSA - continued compliance with CPAP encouraged.    Medication Adjustments/Labs and Tests Ordered: Current medicines are reviewed at length with the patient today.  Concerns regarding medicines are outlined above.  Medication changes, Labs and Tests ordered today are listed in the Patient Instructions below. Patient Instructions  Medication Instructions:  Your physician recommends that you continue on your current medications as directed. Please refer to the Current Medication list given to you today.   Labwork: none  Testing/Procedures: none  Follow-Up: Your physician wants you to follow-up in:  1 year.  You will receive a reminder letter in the mail two months in advance. If you don't receive a letter, please call our office to schedule the follow-up appointment.   Any Other Special Instructions Will Be Listed Below (If Applicable).  If you need a refill on your cardiac medications before your next appointment, please call your pharmacy.    Signed, Erma Heritage, PA-C  10/28/2019 6:58 PM    [facility] [address] [place], [place] [address] Phone: [phone] Fax: [phone]

## 2019-11-07 ENCOUNTER — Other Ambulatory Visit: Payer: Self-pay

## 2019-11-07 ENCOUNTER — Ambulatory Visit: Payer: PPO | Attending: Internal Medicine

## 2019-11-07 DIAGNOSIS — Z23 Encounter for immunization: Secondary | ICD-10-CM

## 2019-11-07 NOTE — Progress Notes (Signed)
   Covid-19 Vaccination Clinic  Name:  Sean Golden    MRN: [id number] DOB: Mar 04, 1946  11/07/2019  Sean Golden was observed post Covid-19 immunization for 15 minutes without incidence. He was provided with Vaccine Information Sheet and instruction to access the V-Safe system.   Sean Golden was instructed to call 911 with any severe reactions post vaccine: Marland Kitchen Difficulty breathing  . Swelling of your face and throat  . A fast heartbeat  . A bad rash all over your body  . Dizziness and weakness    Immunizations Administered    Name Date Dose VIS Date Route   Moderna COVID-19 Vaccine 11/07/2019  4:01 PM 0.5 mL 08/24/2019 Intramuscular   Manufacturer: Levan Hurst   Lot: [id number]   NDC: [id number]   Moderna COVID-19 Vaccine 11/07/2019  3:58 PM 0.5 mL 08/24/2019 Intramuscular   Manufacturer: Moderna   Lot: [id number]   [id number]

## 2019-11-18 ENCOUNTER — Encounter: Payer: Self-pay | Admitting: Family Medicine

## 2019-11-29 ENCOUNTER — Telehealth: Payer: Self-pay

## 2019-11-29 NOTE — Telephone Encounter (Signed)
Returned pt call. He states that on Saturday morning he woke up to a nose bleed. He stated that it took him 30 minutes to get it to stop. This morning he had the same thing happen and it was not quite as bad, but took about 5-10 minutes to get it to stop. He asks if this is normal and if it is due to being on Eiquis. Please advise.

## 2019-11-29 NOTE — Telephone Encounter (Signed)
Pt concerned about frequent nosebleeds  Please call [phone]  Thanks renee

## 2019-11-30 NOTE — Telephone Encounter (Signed)
Nosebleeds or not caused by Eliquis but if he has a propensity for nosebleeds, being on a blood thinner will only exacerbate this.  He may need to have a blood vessel cauterized.  I recommend he follow-up with ENT.

## 2019-11-30 NOTE — Telephone Encounter (Signed)
Returned pt call. Informed him of recommendations. He voiced understanding.

## 2019-12-05 ENCOUNTER — Ambulatory Visit: Payer: PPO | Attending: Internal Medicine

## 2019-12-05 DIAGNOSIS — Z23 Encounter for immunization: Secondary | ICD-10-CM

## 2019-12-05 NOTE — Progress Notes (Signed)
   Covid-19 Vaccination Clinic  Name:  Sean Golden    MRN: [id number] DOB: 1945-11-24  12/05/2019  Mr. Sean Golden was observed post Covid-19 immunization for 15 minutes without incident. He was provided with Vaccine Information Sheet and instruction to access the V-Safe system.   Mr. Sean Golden was instructed to call 911 with any severe reactions post vaccine: Marland Kitchen Difficulty breathing  . Swelling of face and throat  . A fast heartbeat  . A bad rash all over body  . Dizziness and weakness   Immunizations Administered    Name Date Dose VIS Date Route   Moderna COVID-19 Vaccine 12/05/2019  1:04 PM 0.5 mL 08/24/2019 Intramuscular   Manufacturer: Moderna   Lot: [id number]   [id number]

## 2019-12-09 ENCOUNTER — Telehealth: Payer: Self-pay

## 2019-12-09 NOTE — Telephone Encounter (Signed)
Pt states he has an appt with an ENT for nose bleeds BP this AM 160/100 took meds BP 125/85  Please call if you have further instructions [phone]   Thanks renee

## 2019-12-09 NOTE — Telephone Encounter (Signed)
Pt states that he did not feel well this morning and decided to check his BP and it was noted to be 160/100. Pt states that he then took another Losartan 50 mg. A total of losartan 100 mg today. His BP is now 125/85. States that he does not have a lot of energy but that is not new for him. Please advise.

## 2019-12-09 NOTE — Telephone Encounter (Signed)
Pt notified and voiced understanding 

## 2019-12-09 NOTE — Telephone Encounter (Signed)
Have him continue to monitor his blood pressure.  If he continues to have elevated readings, I would increase losartan to 50 mg every morning and 25 mg every evening.

## 2019-12-10 ENCOUNTER — Ambulatory Visit
Admission: EM | Admit: 2019-12-10 | Discharge: 2019-12-10 | Disposition: A | Discharge status: Home / Self Care | Payer: PPO | Attending: Emergency Medicine | Admitting: Emergency Medicine

## 2019-12-10 DIAGNOSIS — Z7901 Long term (current) use of anticoagulants: Secondary | ICD-10-CM | POA: Insufficient documentation

## 2019-12-10 DIAGNOSIS — R040 Epistaxis: Secondary | ICD-10-CM | POA: Diagnosis not present

## 2019-12-10 LAB — CBC WITH DIFFERENTIAL/PLATELET
Abs Immature Granulocytes: 0.03 10*3/uL (ref 0.00–0.07)
Basophils Absolute: 0.1 10*3/uL (ref 0.0–0.1)
Basophils Relative: 1 %
Eosinophils Absolute: 0.2 10*3/uL (ref 0.0–0.5)
Eosinophils Relative: 2 %
HCT: 47.9 % (ref 39.0–52.0)
Hemoglobin: 16.3 g/dL (ref 13.0–17.0)
Immature Granulocytes: 0 %
Lymphocytes Relative: 25 %
Lymphs Abs: 2.3 10*3/uL (ref 0.7–4.0)
MCH: 31.9 pg (ref 26.0–34.0)
MCHC: 34 g/dL (ref 30.0–36.0)
MCV: 93.7 fL (ref 80.0–100.0)
Monocytes Absolute: 1.1 10*3/uL — ABNORMAL HIGH (ref 0.1–1.0)
Monocytes Relative: 12 %
Neutro Abs: 5.5 10*3/uL (ref 1.7–7.7)
Neutrophils Relative %: 60 %
Platelets: 304 10*3/uL (ref 150–400)
RBC: 5.11 MIL/uL (ref 4.22–5.81)
RDW: 12.7 % (ref 11.5–15.5)
WBC: 9.1 10*3/uL (ref 4.0–10.5)
nRBC: 0 % (ref 0.0–0.2)

## 2019-12-10 LAB — PROTIME-INR
INR: 1.1 (ref 0.8–1.2)
Prothrombin Time: 13.8 seconds (ref 11.4–15.2)

## 2019-12-10 MED ORDER — AFRIN NASAL SPRAY 0.05 % NA SOLN: 1.0000 | Freq: Two times a day (BID) | NASAL | 0 refills | Status: DC

## 2019-12-10 NOTE — ED Provider Notes (Signed)
[name]   [id number] 12/10/19 Arrival Time: 1702  CC: Nose bleeds  SUBJECTIVE: History from: patient.  Sean Golden is a 74 y.o. male who presents with complaint of intermittent nose bleeds, apx 5 episodes over the past month.  States initially started off as few drops, but has become heavier.  Most recent was last night.  Denies a precipitating event, nose or head trauma.  Patient is on eliquis.  Was told by cardiology that this would not cause his nosebleeds, but would cause him to bleed more easily.  Also uses CPAP at night.  Patient has tried leaning forward and occluded nostrils with resolution of nosebleeds.  Denies aggravating factors.  Denies similar symptoms in the past.  Has appt with ENT early next month.  Denies fever, chills, fatigue, HA, slurred speech, facial droop, weakness in arms or legs, sinus pain, rhinorrhea, sore throat, SOB, wheezing, chest pain, nausea, vomiting, changes in bowel or bladder habits, hematochezia, melena, abnormal bleeding gums, ecchymosis.    ROS: As per HPI.  All other pertinent ROS negative.     Past Medical History:  Diagnosis Date  . Atrial fibrillation ([place]) SEP 2015  . Essential (primary) hypertension   . Gastro-esophageal reflux disease with esophagitis   . Helicobacter pylori gastritis Dec 2015   Treated with Pylera  . Hypertension   . Obstructive sleep apnea (adult) (pediatric)   . Shortness of breath dyspnea   . Sleep apnea    Past Surgical History:  Procedure Laterality Date  . CARDIAC ELECTROPHYSIOLOGY STUDY AND ABLATION  2010?  . COLONOSCOPY  2005   SOLITARY RECTAL ULCER  . COLONOSCOPY N/A 09/06/2014   Dr. Oneida Alar: three colon polyps, moderate diverticulosis throughout the entire examined colon  . ESOPHAGOGASTRODUODENOSCOPY N/A 09/06/2014   Dr. Fields:moderate erosive gastritis, no Barrett's. +H.pylori gastritis, treated with Pylera  . HEMORRHOID BANDING N/A 09/06/2014   NO BANDING PERFORMED. NO INTERNAL  HEMORRHOIDS IDENTIFIED   . UPPER GASTROINTESTINAL ENDOSCOPY  2005 RMR  . VASECTOMY     No Known Allergies No current facility-administered medications on file prior to encounter.   Current Outpatient Medications on File Prior to Encounter  Medication Sig Dispense Refill  . diltiazem (CARDIZEM CD) 120 MG 24 hr capsule Take 2 tablets in the am (240 mg total) and take 1 tablet (120 mg total) in the pm    . ELIQUIS 5 MG TABS tablet TAKE ONE TABLET TWICE DAILY 60 tablet 11  . losartan (COZAAR) 50 MG tablet Take 50 mg by mouth daily.    . pantoprazole (PROTONIX) 40 MG tablet Take 1 tablet (40 mg total) by mouth daily. 30 tablet 11  . tamsulosin (FLOMAX) 0.4 MG CAPS capsule Take 0.4 mg by mouth daily.     Social History   Socioeconomic History  . Marital status: Married    Spouse name: Not on file  . Number of children: Not on file  . Years of education: Not on file  . Highest education level: Not on file  Occupational History  . Occupation: Full time  Tobacco Use  . Smoking status: Former Smoker    Packs/day: 0.50    Years: 15.00    Pack years: 7.50    Types: Cigarettes    Start date: 05/28/1967    Quit date: 05/27/1984    Years since quitting: 35.5  . Smokeless tobacco: Never Used  Substance and Sexual Activity  . Alcohol use: Yes    Alcohol/week: 7.0 standard drinks    Types:  7 Glasses of wine per week  . Drug use: No  . Sexual activity: Not Currently  Other Topics Concern  . Not on file  Social History Narrative   Married-2 KIDS(AGE 35 AND 38).   No regular exercise   Social Determinants of Health   Financial Resource Strain:   . Difficulty of Paying Living Expenses:   Food Insecurity:   . Worried About Charity fundraiser in the Last Year:   . Arboriculturist in the Last Year:   Transportation Needs:   . Film/video editor (Medical):   Marland Kitchen Lack of Transportation (Non-Medical):   Physical Activity:   . Days of Exercise per Week:   . Minutes of Exercise per  Session:   Stress:   . Feeling of Stress :   Social Connections:   . Frequency of Communication with Friends and Family:   . Frequency of Social Gatherings with Friends and Family:   . Attends Religious Services:   . Active Member of Clubs or Organizations:   . Attends Archivist Meetings:   Marland Kitchen Marital Status:   Intimate Partner Violence:   . Fear of Current or Ex-Partner:   . Emotionally Abused:   Marland Kitchen Physically Abused:   . Sexually Abused:    Family History  Problem Relation Age of Onset  . Hypertension Mother   . Heart attack Father   . Hyperlipidemia Sister   . Hyperlipidemia Brother   . Colon polyps Brother   . Hypertension Brother   . Hypertension Sister   . Diabetes Brother   . Stroke Other   . Diabetes Other   . Hypertension Other   . Hyperlipidemia Other   . Colon cancer Neg Hx     OBJECTIVE:  Vitals:   12/10/19 1719  BP: 139/87  Pulse: 76  Resp: 18  Temp: 97.9 F (36.6 C)  TempSrc: Oral  SpO2: 96%    General appearance: alert; well-appearing, nontoxic  HEENT: NCAT; Ears: EACs clear, TMs pearly gray with visible cone of light, without erythema; Eyes: PERRL, EOMI grossly; Nose: patent without rhinorrhea or blood; Throat: oropharynx clear, tonsils 1+ without white tonsillar exudates, uvula midline Neck: supple without LAD Lungs: unlabored respirations, symmetrical air entry; cough: absent; no respiratory distress Heart: regular rate and rhythm.  Radial pulses 2+ symmetrical bilaterally Skin: warm and dry Psychological: alert and cooperative; normal mood and affect  ASSESSMENT & PLAN:  1. Epistaxis   2. Long term current use of anticoagulant therapy    Symptoms may be secondary to season changes and/or use of CPAP at night Moisturize nasal passages with q-tip with Vaseline daily  Afrin prescribed.  This is a vasoconstrictor and may help if you experience another nosebleed otherwise continue with occluding the nostrils and leaning forward.   Avoid tilting head back as this may cause the blood to go down your throat Blood work ordered.   Follow up with ENT for further evaluation and management Return or go to the ED if you have any new or worsening symptoms such as fever, chills, nausea, vomiting, persistent nose bleed that does not resolve with the above recommendations, facial droop, slurred speech, weakness in arms or legs, etc...  Reviewed expectations re: course of current medical issues. Questions answered. Outlined signs and symptoms indicating need for more acute intervention. Patient verbalized understanding. After Visit Summary given.         Lestine Box, PA-C 12/10/19 1827

## 2019-12-10 NOTE — Discharge Instructions (Signed)
Symptoms may be secondary to season changes and/or use of CPAP at night Moisturize nasal passages with q-tip with Vaseline daily  Afrin prescribed.  This is a vasoconstrictor and may help if you experience another nosebleed otherwise continue with occluding the nostrils and leaning forward.  Avoid tilting head back as this may cause the blood to go down your throat Follow up with ENT for further evaluation and management Return or go to the ED if you have any new or worsening symptoms such as fever, chills, nausea, vomiting, persistent nose bleed that does not resolve with the above recommendations, facial droop, slurred speech, weakness in arms or legs, etc..Sean Golden

## 2019-12-10 NOTE — ED Triage Notes (Signed)
Pt had nosebleed of a "few drops" on first of month.  On the 6th, had a heavy nosebleed during sleep.  Since then, has had about 5 nosebleeds.  Typically happens at night with only one occurrence during day.  Wears CPAP at night.  Contacted his cardiologist and was told it was not r/t to his Eliquis.  Has ENT appt on 04/05.  Home BP was 160/100 yesterday.  Normally runs 130s/80s.  Denies significant HA recently but has noticed visual disturbances in R eye described as flashes of light.   Pt unsure if related but was told by his dentist in November that he has a "red spot" on this throat that needs checked.  He saw his PCP in December who hadn't heard from dentist and didn't check spot but referred him to ENT.

## 2019-12-11 ENCOUNTER — Encounter (HOSPITAL_COMMUNITY): Payer: Self-pay | Admitting: Emergency Medicine

## 2019-12-11 ENCOUNTER — Other Ambulatory Visit: Payer: Self-pay

## 2019-12-11 ENCOUNTER — Emergency Department (HOSPITAL_COMMUNITY)
Admission: EM | Admit: 2019-12-11 | Discharge: 2019-12-11 | Disposition: A | Discharge status: Home / Self Care | Payer: PPO | Attending: Emergency Medicine | Admitting: Emergency Medicine

## 2019-12-11 DIAGNOSIS — Z79899 Other long term (current) drug therapy: Secondary | ICD-10-CM | POA: Insufficient documentation

## 2019-12-11 DIAGNOSIS — Z87891 Personal history of nicotine dependence: Secondary | ICD-10-CM | POA: Insufficient documentation

## 2019-12-11 DIAGNOSIS — Z7901 Long term (current) use of anticoagulants: Secondary | ICD-10-CM | POA: Insufficient documentation

## 2019-12-11 DIAGNOSIS — R040 Epistaxis: Secondary | ICD-10-CM | POA: Diagnosis not present

## 2019-12-11 DIAGNOSIS — I10 Essential (primary) hypertension: Secondary | ICD-10-CM | POA: Insufficient documentation

## 2019-12-11 LAB — BASIC METABOLIC PANEL
Anion gap: 8 (ref 5–15)
BUN: 15 mg/dL (ref 8–23)
CO2: 20 mmol/L — ABNORMAL LOW (ref 22–32)
Calcium: 8.8 mg/dL — ABNORMAL LOW (ref 8.9–10.3)
Chloride: 106 mmol/L (ref 98–111)
Creatinine, Ser: 0.94 mg/dL (ref 0.61–1.24)
GFR calc Af Amer: >60 mL/min (ref >60)
GFR calc non Af Amer: >60 mL/min (ref >60)
Glucose, Bld: 133 mg/dL — ABNORMAL HIGH (ref 70–99)
Potassium: 4.3 mmol/L (ref 3.5–5.1)
Sodium: 134 mmol/L — ABNORMAL LOW (ref 135–145)

## 2019-12-11 LAB — CBC WITH DIFFERENTIAL/PLATELET
Abs Immature Granulocytes: 0.04 10*3/uL (ref 0.00–0.07)
Basophils Absolute: 0.1 10*3/uL (ref 0.0–0.1)
Basophils Relative: 1 %
Eosinophils Absolute: 0.3 10*3/uL (ref 0.0–0.5)
Eosinophils Relative: 3 %
HCT: 50.4 % (ref 39.0–52.0)
Hemoglobin: 17 g/dL (ref 13.0–17.0)
Immature Granulocytes: 1 %
Lymphocytes Relative: 33 %
Lymphs Abs: 2.7 10*3/uL (ref 0.7–4.0)
MCH: 32.3 pg (ref 26.0–34.0)
MCHC: 33.7 g/dL (ref 30.0–36.0)
MCV: 95.8 fL (ref 80.0–100.0)
Monocytes Absolute: 1 10*3/uL (ref 0.1–1.0)
Monocytes Relative: 13 %
Neutro Abs: 4.1 10*3/uL (ref 1.7–7.7)
Neutrophils Relative %: 49 %
Platelets: 292 10*3/uL (ref 150–400)
RBC: 5.26 MIL/uL (ref 4.22–5.81)
RDW: 12.7 % (ref 11.5–15.5)
WBC: 8.1 10*3/uL (ref 4.0–10.5)
nRBC: 0 % (ref 0.0–0.2)

## 2019-12-11 MED ORDER — OXYMETAZOLINE HCL 0.05 % NA SOLN
2.0000 | Freq: Two times a day (BID) | NASAL | Status: DC | PRN
  Administered 2019-12-11: 2 via NASAL
  Filled 2019-12-11: qty 30

## 2019-12-11 NOTE — ED Triage Notes (Signed)
Patient complains of nose bleed that started this morning. Pt states has been having problems with bleeding on and off since 1st of March. Patient is on blood thinner.

## 2019-12-11 NOTE — Discharge Instructions (Addendum)
Use the Afrin 2 sprays every 12 hours for the next 24 hours.  Then as needed.  Call ear nose and throat for follow-up.  We described the need to pinch the nose if bleeding starts for 20 minutes.  That does not stop but then blow all the clots out and resprayed with Afrin and pinch again for another 20 minutes.  That does not stop but then return.  Blood work here today without any acute or significant changes.

## 2019-12-11 NOTE — ED Provider Notes (Signed)
[facility] EMERGENCY DEPARTMENT Provider Note   CSN: [id number] Arrival date & time: 12/11/19  1001     History Chief Complaint  Patient presents with  . Epistaxis    Sean Golden is a 74 y.o. male.  Patient has been struggling with a right-sided nosebleed on and off since the beginning of March.  Patient's been on Eliquis now for years.  Patient seen by urgent care yesterday.  Given a prescription for Afrin but has not got it filled.  He has follow-up arranged with ear nose and throat Dr. Lorelee Cover but apparently appointments not to early April.  Has not had heavy bleeding has more trouble with the bleeding at night unfortunately patient has to use CPAP.  No prior history of nosebleed problems.  It is always the right side of the nose.        Past Medical History:  Diagnosis Date  . Atrial fibrillation ([place]) SEP 2015  . Essential (primary) hypertension   . Gastro-esophageal reflux disease with esophagitis   . Helicobacter pylori gastritis Dec 2015   Treated with Pylera  . Hypertension   . Obstructive sleep apnea (adult) (pediatric)   . Shortness of breath dyspnea   . Sleep apnea     Patient Active Problem List   Diagnosis Date Noted  . DOE (dyspnea on exertion) 10/27/2019  . Elevated glucose 10/27/2019  . Helicobacter pylori gastritis 12/06/2014  . History of colonic polyps 12/06/2014  . Rectal bleeding 08/24/2014  . GERD (gastroesophageal reflux disease) 08/24/2014  . Atrial fibrillation ([place]) 05/27/2014  . Aortic root enlargement ([place]) 05/27/2014  . Sleep apnea 05/27/2014  . Atrial flutter ([place]) 05/18/2009  . FATIGUE / MALAISE 05/18/2009  . Essential hypertension 05/09/2009    Past Surgical History:  Procedure Laterality Date  . CARDIAC ELECTROPHYSIOLOGY STUDY AND ABLATION  2010?  . COLONOSCOPY  2005   SOLITARY RECTAL ULCER  . COLONOSCOPY N/A 09/06/2014   Dr. Oneida Alar: three colon polyps, moderate diverticulosis throughout the entire examined colon  .  ESOPHAGOGASTRODUODENOSCOPY N/A 09/06/2014   Dr. Fields:moderate erosive gastritis, no Barrett's. +H.pylori gastritis, treated with Pylera  . HEMORRHOID BANDING N/A 09/06/2014   NO BANDING PERFORMED. NO INTERNAL HEMORRHOIDS IDENTIFIED   . UPPER GASTROINTESTINAL ENDOSCOPY  2005 RMR  . VASECTOMY         Family History  Problem Relation Age of Onset  . Hypertension Mother   . Heart attack Father   . Hyperlipidemia Sister   . Hyperlipidemia Brother   . Colon polyps Brother   . Hypertension Brother   . Hypertension Sister   . Diabetes Brother   . Stroke Other   . Diabetes Other   . Hypertension Other   . Hyperlipidemia Other   . Colon cancer Neg Hx     Social History   Tobacco Use  . Smoking status: Former Smoker    Packs/day: 0.50    Years: 15.00    Pack years: 7.50    Types: Cigarettes    Start date: 05/28/1967    Quit date: 05/27/1984    Years since quitting: 35.5  . Smokeless tobacco: Never Used  Substance Use Topics  . Alcohol use: Yes    Alcohol/week: 7.0 standard drinks    Types: 7 Glasses of wine per week  . Drug use: No    Home Medications Prior to Admission medications   Medication Sig Start Date End Date Taking? Authorizing Provider  diltiazem (CARDIZEM CD) 120 MG 24 hr capsule Take 2 tablets in  the am (240 mg total) and take 1 tablet (120 mg total) in the pm Patient taking differently: Take 120-240 mg by mouth in the morning and at bedtime. Patient takes 2 capsules(240mg ) in the morning and take 1 capsule (120mg ) in the evening 10/28/19  Yes Strader, [place] M, PA-C  ELIQUIS 5 MG TABS tablet TAKE ONE TABLET TWICE DAILY Patient taking differently: Take 5 mg by mouth 2 (two) times daily.  01/04/19  Yes Herminio Commons, MD  losartan (COZAAR) 50 MG tablet Take 50 mg by mouth daily.   Yes [provider]  oxymetazoline (AFRIN NASAL SPRAY) 0.05 % nasal spray Place 1 spray into both nostrils 2 (two) times daily. 12/10/19  Yes Wurst, [place], PA-C    pantoprazole (PROTONIX) 40 MG tablet Take 1 tablet (40 mg total) by mouth daily. 09/30/15  Yes Mahala Menghini, PA-C  tamsulosin (FLOMAX) 0.4 MG CAPS capsule Take 0.4 mg by mouth daily.   Yes [provider]    Allergies    Patient has no known allergies.  Review of Systems   Review of Systems  Constitutional: Negative for chills and fever.  HENT: Positive for nosebleeds. Negative for rhinorrhea and sore throat.   Eyes: Negative for visual disturbance.  Respiratory: Negative for cough and shortness of breath.   Cardiovascular: Negative for chest pain and leg swelling.  Gastrointestinal: Negative for abdominal pain, diarrhea, nausea and vomiting.  Genitourinary: Negative for dysuria.  Musculoskeletal: Negative for back pain and neck pain.  Skin: Negative for rash.  Neurological: Negative for dizziness, light-headedness and headaches.  Hematological: Bruises/bleeds easily.  Psychiatric/Behavioral: Negative for confusion.    Physical Exam Updated Vital Signs BP (!) 160/95 (BP Location: Right Arm)   Pulse 93   Temp 98.4 F (36.9 C) (Oral)   Resp 16   Ht 1.803 m (5\' 11" )   Wt 117.9 kg   SpO2 98%   BMI 36.26 kg/m   Physical Exam Vitals and nursing note reviewed.  Constitutional:      General: He is not in acute distress.    Appearance: He is well-developed.  HENT:     Head: Normocephalic and atraumatic.     Nose:     Comments: Left nares without any bleeding.  Right nares you can see the source of where bleeding has occurred.  Anterior part of the nose.  Little bit of deviated septum as well.  No active bleeding.  No blood going down the back of the throat. Eyes:     Extraocular Movements: Extraocular movements intact.     Conjunctiva/sclera: Conjunctivae normal.     Pupils: Pupils are equal, round, and reactive to light.  Cardiovascular:     Rate and Rhythm: Normal rate and regular rhythm.     Heart sounds: No murmur.  Pulmonary:     Effort: Pulmonary effort  is normal. No respiratory distress.     Breath sounds: Normal breath sounds.  Abdominal:     Palpations: Abdomen is soft.     Tenderness: There is no abdominal tenderness.  Musculoskeletal:        General: Normal range of motion.     Cervical back: Normal range of motion and neck supple.  Skin:    General: Skin is warm and dry.     Capillary Refill: Capillary refill takes less than 2 seconds.  Neurological:     General: No focal deficit present.     Mental Status: He is alert and oriented to person, place, and  time.     ED Results / Procedures / Treatments   Labs (all labs ordered are listed, but only abnormal results are displayed) Labs Reviewed  BASIC METABOLIC PANEL - Abnormal; Notable for the following components:      Result Value   Sodium 134 (*)    CO2 20 (*)    Glucose, Bld 133 (*)    Calcium 8.8 (*)    All other components within normal limits  CBC WITH DIFFERENTIAL/PLATELET    EKG None  Radiology No results found.  Procedures Procedures (including critical care time)  Medications Ordered in ED Medications  oxymetazoline (AFRIN) 0.05 % nasal spray 2 spray (2 sprays Right Nare Given 12/11/19 1054)    ED Course  I have reviewed the triage vital signs and the nursing notes.  Pertinent labs & imaging results that were available during my care of the patient were reviewed by me and considered in my medical decision making (see chart for details).    MDM Rules/Calculators/A&P                      Patient when they first came in had a little bit of drip from the right nares.  When I examined him there was no active bleeding.  Treated with Afrin here.  Does have a history of hypertension but blood pressure currently fairly controlled.  Will have patient give ENT office a call on Monday.  Also maternity of office provided in case patient has an opportunity to get seen earlier.  Labs today without any significant abnormality.  Hemoglobin is 17.  Platelets were  within normal range.  Bleeding now controlled.  Packing not required.    Final Clinical Impression(s) / ED Diagnoses Final diagnoses:  Right-sided epistaxis    Rx / DC Orders ED Discharge Orders    None       Fredia Sorrow, MD 12/11/19 1158

## 2019-12-13 ENCOUNTER — Other Ambulatory Visit: Payer: Self-pay | Admitting: Cardiovascular Disease

## 2019-12-27 DIAGNOSIS — R040 Epistaxis: Secondary | ICD-10-CM | POA: Diagnosis not present

## 2020-01-03 ENCOUNTER — Other Ambulatory Visit: Payer: Self-pay | Admitting: Cardiovascular Disease

## 2020-01-26 DIAGNOSIS — R040 Epistaxis: Secondary | ICD-10-CM | POA: Diagnosis not present

## 2020-01-27 DIAGNOSIS — Z8679 Personal history of other diseases of the circulatory system: Secondary | ICD-10-CM | POA: Diagnosis not present

## 2020-01-27 DIAGNOSIS — R3915 Urgency of urination: Secondary | ICD-10-CM | POA: Diagnosis not present

## 2020-01-27 DIAGNOSIS — I10 Essential (primary) hypertension: Secondary | ICD-10-CM | POA: Diagnosis not present

## 2020-01-27 DIAGNOSIS — Z0189 Encounter for other specified special examinations: Secondary | ICD-10-CM | POA: Diagnosis not present

## 2020-01-27 DIAGNOSIS — I482 Chronic atrial fibrillation, unspecified: Secondary | ICD-10-CM | POA: Diagnosis not present

## 2020-01-27 DIAGNOSIS — R040 Epistaxis: Secondary | ICD-10-CM | POA: Diagnosis not present

## 2020-01-27 DIAGNOSIS — K219 Gastro-esophageal reflux disease without esophagitis: Secondary | ICD-10-CM | POA: Diagnosis not present

## 2020-02-08 DIAGNOSIS — R040 Epistaxis: Secondary | ICD-10-CM | POA: Diagnosis not present

## 2020-02-11 DIAGNOSIS — R040 Epistaxis: Secondary | ICD-10-CM | POA: Diagnosis not present

## 2020-03-13 DIAGNOSIS — R040 Epistaxis: Secondary | ICD-10-CM | POA: Diagnosis not present

## 2020-07-25 ENCOUNTER — Other Ambulatory Visit: Payer: Self-pay | Admitting: *Deleted

## 2020-07-25 MED ORDER — APIXABAN 5 MG PO TABS: 5.0000 mg | ORAL_TABLET | Freq: Two times a day (BID) | ORAL | 11 refills | Status: DC

## 2020-08-10 DIAGNOSIS — H1033 Unspecified acute conjunctivitis, bilateral: Secondary | ICD-10-CM | POA: Diagnosis not present

## 2020-08-10 DIAGNOSIS — Z23 Encounter for immunization: Secondary | ICD-10-CM | POA: Diagnosis not present

## 2020-08-28 DIAGNOSIS — I10 Essential (primary) hypertension: Secondary | ICD-10-CM | POA: Diagnosis not present

## 2020-08-28 DIAGNOSIS — E782 Mixed hyperlipidemia: Secondary | ICD-10-CM | POA: Diagnosis not present

## 2020-08-28 DIAGNOSIS — Z23 Encounter for immunization: Secondary | ICD-10-CM | POA: Diagnosis not present

## 2020-08-31 DIAGNOSIS — R040 Epistaxis: Secondary | ICD-10-CM | POA: Diagnosis not present

## 2020-08-31 DIAGNOSIS — R3915 Urgency of urination: Secondary | ICD-10-CM | POA: Diagnosis not present

## 2020-08-31 DIAGNOSIS — Z0001 Encounter for general adult medical examination with abnormal findings: Secondary | ICD-10-CM | POA: Diagnosis not present

## 2020-08-31 DIAGNOSIS — R7303 Prediabetes: Secondary | ICD-10-CM | POA: Diagnosis not present

## 2020-08-31 DIAGNOSIS — R7301 Impaired fasting glucose: Secondary | ICD-10-CM | POA: Diagnosis not present

## 2020-08-31 DIAGNOSIS — Z8679 Personal history of other diseases of the circulatory system: Secondary | ICD-10-CM | POA: Diagnosis not present

## 2020-08-31 DIAGNOSIS — E782 Mixed hyperlipidemia: Secondary | ICD-10-CM | POA: Diagnosis not present

## 2020-08-31 DIAGNOSIS — K219 Gastro-esophageal reflux disease without esophagitis: Secondary | ICD-10-CM | POA: Diagnosis not present

## 2020-08-31 DIAGNOSIS — I482 Chronic atrial fibrillation, unspecified: Secondary | ICD-10-CM | POA: Diagnosis not present

## 2020-08-31 DIAGNOSIS — I10 Essential (primary) hypertension: Secondary | ICD-10-CM | POA: Diagnosis not present

## 2020-09-13 ENCOUNTER — Encounter: Payer: Self-pay | Admitting: Cardiology

## 2020-09-13 NOTE — Progress Notes (Deleted)
Cardiology Office Note  Date: 09/13/2020   ID: Sean, Golden Dec 01, 1945, MRN [id number]  PCP:  Sean Squibb, MD  Cardiologist:  Rozann Lesches, MD Electrophysiologist:  None   No chief complaint on file.   History of Present Illness: Sean Golden is a 74 y.o. male former patient of Dr. Bronson Ing now presenting to establish follow-up with me.  I reviewed his records and updated the chart.  He was most recently seen in February by Ms. Strader PA-C.  Last echocardiogram was in 2019 at which point LVEF was 60 to 65%, mildly dilated left atrium and right atrium, mildly to moderately dilated right ventricle.  Past Medical History:  Diagnosis Date  . Atrial fibrillation ([place]) SEP 2015  . Essential hypertension   . GERD (gastroesophageal reflux disease)   . Helicobacter pylori gastritis Dec 2015   Treated with Pylera  . Sleep apnea     Past Surgical History:  Procedure Laterality Date  . CARDIAC ELECTROPHYSIOLOGY STUDY AND ABLATION  2010?  . COLONOSCOPY  2005   SOLITARY RECTAL ULCER  . COLONOSCOPY N/A 09/06/2014   Dr. Oneida Alar: three colon polyps, moderate diverticulosis throughout the entire examined colon  . ESOPHAGOGASTRODUODENOSCOPY N/A 09/06/2014   Dr. Fields:moderate erosive gastritis, no Barrett's. +H.pylori gastritis, treated with Pylera  . HEMORRHOID BANDING N/A 09/06/2014   NO BANDING PERFORMED. NO INTERNAL HEMORRHOIDS IDENTIFIED   . UPPER GASTROINTESTINAL ENDOSCOPY  2005 RMR  . VASECTOMY      Current Outpatient Medications  Medication Sig Dispense Refill  . apixaban (ELIQUIS) 5 MG TABS tablet Take 1 tablet (5 mg total) by mouth 2 (two) times daily. 60 tablet 11  . diltiazem (CARDIZEM CD) 120 MG 24 hr capsule Take 2 tablets in the am (240 mg total) and take 1 tablet (120 mg total) in the pm (Patient taking differently: Take 120-240 mg by mouth in the morning and at bedtime. Patient takes 2 capsules(240mg ) in the morning and take 1 capsule (120mg ) in the  evening)    . losartan (COZAAR) 50 MG tablet TAKE ONE (1) TABLET BY MOUTH EVERY DAY 90 tablet 3  . oxymetazoline (AFRIN NASAL SPRAY) 0.05 % nasal spray Place 1 spray into both nostrils 2 (two) times daily. 37 mL 0  . pantoprazole (PROTONIX) 40 MG tablet Take 1 tablet (40 mg total) by mouth daily. 30 tablet 11  . tamsulosin (FLOMAX) 0.4 MG CAPS capsule Take 0.4 mg by mouth daily.     No current facility-administered medications for this visit.   Allergies:  Patient has no known allergies.   Social History: The patient  reports that he quit smoking about 36 years ago. His smoking use included cigarettes. He started smoking about 53 years ago. He has a 7.50 pack-year smoking history. He has never used smokeless tobacco. He reports current alcohol use of about 7.0 standard drinks of alcohol per week. He reports that he does not use drugs.   Family History: The patient's family history includes Colon polyps in his brother; Diabetes in his brother and another family member; Heart attack in his father; Hyperlipidemia in his brother, sister, and another family member; Hypertension in his brother, mother, sister, and another family member; Stroke in an other family member.   ROS:  Please see the history of present illness. Otherwise, complete review of systems is positive for {NONE DEFAULTED:18576::"none"}.  All other systems are reviewed and negative.   Physical Exam: VS:  There were no vitals taken for this visit.,  BMI There is no height or weight on file to calculate BMI.  Wt Readings from Last 3 Encounters:  12/11/19 260 lb (117.9 kg)  10/28/19 268 lb (121.6 kg)  10/27/19 265 lb 9.6 oz (120.5 kg)    General: Patient appears comfortable at rest. HEENT: Conjunctiva and lids normal, oropharynx clear with moist mucosa. Neck: Supple, no elevated JVP or carotid bruits, no thyromegaly. Lungs: Clear to auscultation, nonlabored breathing at rest. Cardiac: Regular rate and rhythm, no S3 or significant  systolic murmur, no pericardial rub. Abdomen: Soft, nontender, no hepatomegaly, bowel sounds present, no guarding or rebound. Extremities: No pitting edema, distal pulses 2+. Skin: Warm and dry. Musculoskeletal: No kyphosis. Neuropsychiatric: Alert and oriented x3, affect grossly appropriate.  ECG:  An ECG dated 10/27/2019 was personally reviewed today and demonstrated:  Rate controlled atrial fibrillation.  Recent Labwork: 12/11/2019: BUN 15; Creatinine, Ser 0.94; Hemoglobin 17.0; Platelets 292; Potassium 4.3; Sodium 134     Component Value Date/Time   CHOL 205 (A) 08/11/2019 0000   TRIG 120 08/11/2019 0000   HDL 45 08/11/2019 0000   LDLCALC 136 08/11/2019 0000    Other Studies Reviewed Today:  Echocardiogram 06/09/2014: - Procedure narrative: Transthoracic echocardiography. Image  quality was suboptimal. The study was technically difficult, as a  result of poor sound wave transmission and body habitus.  - Left ventricle: The cavity size was normal. Wall thickness was  increased in a pattern of moderate LVH. Systolic function was  normal. The estimated ejection fraction was in the range of 60%  to 65%. Wall motion was normal; there were no regional wall  motion abnormalities. The study was not technically sufficient to  allow evaluation of LV diastolic dysfunction due to atrial  fibrillation.  - Mitral valve: There was trivial regurgitation.  - Left atrium: The atrium was mildly dilated.  - Right ventricle: The cavity size was mildly to moderately  dilated.  - Right atrium: The atrium was mildly dilated.  - Tricuspid valve: There was trivial regurgitation.  - Pericardium, extracardiac: A trivial pericardial effusion was  identified.   Assessment and Plan:   Medication Adjustments/Labs and Tests Ordered: Current medicines are reviewed at length with the patient today.  Concerns regarding medicines are outlined above.   Tests Ordered: No orders of the  defined types were placed in this encounter.   Medication Changes: No orders of the defined types were placed in this encounter.   Disposition:  Follow up {follow up:15908}  Signed, Satira Sark, MD, [facility] 09/13/2020 4:44 PM    [facility] [address] [place], [place] [address] Phone: [phone]; Fax: [phone]

## 2020-09-14 ENCOUNTER — Ambulatory Visit: Payer: PPO | Admitting: Cardiology

## 2020-10-17 NOTE — Progress Notes (Signed)
Cardiology Office Note  Date: 10/18/2020   ID: Sean Golden, Sean Golden 12-Jun-1946, MRN [id number]  PCP:  Celene Squibb, MD  Cardiologist:  Rozann Lesches, MD Electrophysiologist:  None   Chief Complaint  Patient presents with  . Cardiac follow-up    History of Present Illness: Sean Golden is a 75 y.o. male former patient of Dr. Bronson Ing now presenting to establish follow-up with me.  I reviewed his records and updated the chart.  He was last seen in February 2021 by Ms. Strader PA-C.  He does not report any regular sense of palpitations.  Did have a recent episode of chest discomfort this past Sunday, no obvious precipitant, symptoms resolved spontaneously and have not recurred.  He does not describe any regular reflux, no exertional chest pain.  He is following with Dr. [place] Crane for primary care.  We discussed follow-up lab work on [employer].  Requesting most recent lab work obtained in December 2021.  He does not report any spontaneous bleeding problems.  Heart rate is well controlled on current dose of Cardizem CD.  I personally reviewed his ECG which shows atrial fibrillation at 65 bpm.  He works at [employer], tells me that he will likely retire soon.  Past Medical History:  Diagnosis Date  . Atrial fibrillation ([place]) SEP 2015  . Essential hypertension   . GERD (gastroesophageal reflux disease)   . Helicobacter pylori gastritis Dec 2015   Treated with Pylera  . Sleep apnea     Past Surgical History:  Procedure Laterality Date  . CARDIAC ELECTROPHYSIOLOGY STUDY AND ABLATION  2010?  . COLONOSCOPY  2005   SOLITARY RECTAL ULCER  . COLONOSCOPY N/A 09/06/2014   Dr. Oneida Alar: three colon polyps, moderate diverticulosis throughout the entire examined colon  . ESOPHAGOGASTRODUODENOSCOPY N/A 09/06/2014   Dr. Fields:moderate erosive gastritis, no Barrett's. +H.pylori gastritis, treated with Pylera  . HEMORRHOID BANDING N/A 09/06/2014   NO BANDING PERFORMED. NO  INTERNAL HEMORRHOIDS IDENTIFIED   . UPPER GASTROINTESTINAL ENDOSCOPY  2005 RMR  . VASECTOMY      Current Outpatient Medications  Medication Sig Dispense Refill  . apixaban (ELIQUIS) 5 MG TABS tablet Take 1 tablet (5 mg total) by mouth 2 (two) times daily. 60 tablet 11  . diltiazem (CARDIZEM CD) 120 MG 24 hr capsule Take 2 tablets in the am (240 mg total) and take 1 tablet (120 mg total) in the pm (Patient taking differently: Take 120-240 mg by mouth in the morning and at bedtime. Patient takes 2 capsules(240mg ) in the morning and take 1 capsule (120mg ) in the evening)    . losartan (COZAAR) 50 MG tablet TAKE ONE (1) TABLET BY MOUTH EVERY DAY 90 tablet 3  . oxymetazoline (AFRIN NASAL SPRAY) 0.05 % nasal spray Place 1 spray into both nostrils 2 (two) times daily. 37 mL 0  . pantoprazole (PROTONIX) 40 MG tablet Take 1 tablet (40 mg total) by mouth daily. 30 tablet 11  . tamsulosin (FLOMAX) 0.4 MG CAPS capsule Take 0.4 mg by mouth daily.     No current facility-administered medications for this visit.   Allergies:  Patient has no known allergies.   ROS: No orthopnea or PND, no syncope.  Physical Exam: VS:  BP 130/84   Pulse 87   Ht 5' 10.5" (1.791 m)   Wt 263 lb 3.2 oz (119.4 kg)   SpO2 97%   BMI 37.23 kg/m , BMI Body mass index is 37.23 kg/m.  Wt Readings from Last  3 Encounters:  10/18/20 263 lb 3.2 oz (119.4 kg)  12/11/19 260 lb (117.9 kg)  10/28/19 268 lb (121.6 kg)    General: Patient appears comfortable at rest. HEENT: Conjunctiva and lids normal, wearing a mask. Neck: Supple, no elevated JVP or carotid bruits, no thyromegaly. Lungs: Clear to auscultation, nonlabored breathing at rest. Cardiac: Irregularly irregular, no S3 or significant systolic murmur, no pericardial rub. Extremities: No pitting edema.  ECG:  An ECG dated 10/27/2019 was personally reviewed today and demonstrated:  Atrial fibrillation with low voltage in the limb leads, nonspecific ST-T changes.  Recent  Labwork: 12/11/2019: BUN 15; Creatinine, Ser 0.94; Hemoglobin 17.0; Platelets 292; Potassium 4.3; Sodium 134     Component Value Date/Time   CHOL 205 (A) 08/11/2019 0000   TRIG 120 08/11/2019 0000   HDL 45 08/11/2019 0000   LDLCALC 136 08/11/2019 0000    Other Studies Reviewed Today:  Echocardiogram 06/09/2014: - Procedure narrative: Transthoracic echocardiography. Image  quality was suboptimal. The study was technically difficult, as a  result of poor sound wave transmission and body habitus.  - Left ventricle: The cavity size was normal. Wall thickness was  increased in a pattern of moderate LVH. Systolic function was  normal. The estimated ejection fraction was in the range of 60%  to 65%. Wall motion was normal; there were no regional wall  motion abnormalities. The study was not technically sufficient to  allow evaluation of LV diastolic dysfunction due to atrial  fibrillation.  - Mitral valve: There was trivial regurgitation.  - Left atrium: The atrium was mildly dilated.  - Right ventricle: The cavity size was mildly to moderately  dilated.  - Right atrium: The atrium was mildly dilated.  - Tricuspid valve: There was trivial regurgitation.  - Pericardium, extracardiac: A trivial pericardial effusion was  identified.   Assessment and Plan:  1.  Permanent atrial fibrillation, CHA2DS2-VASc score is 2.  He continues on Eliquis for stroke prophylaxis, no reported bleeding problems.  Requesting recent lab work from Dr. [place] Crane.  Continue Cardizem CD for heart rate control.  ECG reviewed.  2.  Essential hypertension, systolic blood pressure 161 today.  He is on losartan in addition to Cardizem CD.  Medication Adjustments/Labs and Tests Ordered: Current medicines are reviewed at length with the patient today.  Concerns regarding medicines are outlined above.   Tests Ordered: Orders Placed This Encounter  Procedures  . EKG 12-Lead    Medication Changes: No  orders of the defined types were placed in this encounter.   Disposition:  Follow up 6 months in the [place] office.  Signed, Satira Sark, MD, [facility] 10/18/2020 8:56 AM    [facility] [place]. [address] [place], [place] [address] Phone: [phone]; Fax: [phone]

## 2020-10-18 ENCOUNTER — Other Ambulatory Visit: Payer: Self-pay

## 2020-10-18 ENCOUNTER — Encounter: Payer: Self-pay | Admitting: *Deleted

## 2020-10-18 ENCOUNTER — Ambulatory Visit: Payer: PPO | Admitting: Cardiology

## 2020-10-18 ENCOUNTER — Encounter: Payer: Self-pay | Admitting: Cardiology

## 2020-10-18 VITALS — BP 130/84 | HR 87 | Ht 70.5 in | Wt 263.2 lb

## 2020-10-18 DIAGNOSIS — I4821 Permanent atrial fibrillation: Secondary | ICD-10-CM | POA: Diagnosis not present

## 2020-10-18 DIAGNOSIS — I10 Essential (primary) hypertension: Secondary | ICD-10-CM | POA: Diagnosis not present

## 2020-10-18 NOTE — Patient Instructions (Signed)
Medication Instructions:  Your physician recommends that you continue on your current medications as directed. Please refer to the Current Medication list given to you today.  *If you need a refill on your cardiac medications before your next appointment, please call your pharmacy*   Lab Work: NONE   If you have labs (blood work) drawn today and your tests are completely normal, you will receive your results only by: . MyChart Message (if you have MyChart) OR . A paper copy in the mail If you have any lab test that is abnormal or we need to change your treatment, we will call you to review the results.   Testing/Procedures: NONE    Follow-Up: At [facility], you and your health needs are our priority.  As part of our continuing mission to provide you with exceptional heart care, we have created designated Provider Care Teams.  These Care Teams include your primary Cardiologist (physician) and Advanced Practice Providers (APPs -  Physician Assistants and Nurse Practitioners) who all work together to provide you with the care you need, when you need it.  We recommend signing up for the patient portal called "MyChart".  Sign up information is provided on this After Visit Summary.  MyChart is used to connect with patients for Virtual Visits (Telemedicine).  Patients are able to view lab/test results, encounter notes, upcoming appointments, etc.  Non-urgent messages can be sent to your provider as well.   To learn more about what you can do with MyChart, go to [email or web address].    Your next appointment:   6 month(s)  The format for your next appointment:   In Person  Provider:   Samuel McDowell, MD   Other Instructions Thank you for choosing [facility]!    

## 2020-10-25 ENCOUNTER — Other Ambulatory Visit: Payer: Self-pay | Admitting: Cardiology

## 2020-10-25 DIAGNOSIS — H16223 Keratoconjunctivitis sicca, not specified as Sjogren's, bilateral: Secondary | ICD-10-CM | POA: Diagnosis not present

## 2020-12-18 ENCOUNTER — Telehealth: Payer: Self-pay

## 2020-12-18 MED ORDER — LOSARTAN POTASSIUM 50 MG PO TABS: ORAL_TABLET | ORAL | 3 refills | Status: DC

## 2020-12-18 NOTE — Telephone Encounter (Signed)
Medication refill request for Losartan 50 mg tablets approved and sent to [facility].

## 2021-01-09 DIAGNOSIS — R7301 Impaired fasting glucose: Secondary | ICD-10-CM | POA: Diagnosis not present

## 2021-01-09 DIAGNOSIS — Z8679 Personal history of other diseases of the circulatory system: Secondary | ICD-10-CM | POA: Diagnosis not present

## 2021-01-09 DIAGNOSIS — I10 Essential (primary) hypertension: Secondary | ICD-10-CM | POA: Diagnosis not present

## 2021-01-09 DIAGNOSIS — Z23 Encounter for immunization: Secondary | ICD-10-CM | POA: Diagnosis not present

## 2021-01-09 DIAGNOSIS — R3915 Urgency of urination: Secondary | ICD-10-CM | POA: Diagnosis not present

## 2021-01-09 DIAGNOSIS — E782 Mixed hyperlipidemia: Secondary | ICD-10-CM | POA: Diagnosis not present

## 2021-01-09 DIAGNOSIS — Z712 Person consulting for explanation of examination or test findings: Secondary | ICD-10-CM | POA: Diagnosis not present

## 2021-01-09 DIAGNOSIS — R7303 Prediabetes: Secondary | ICD-10-CM | POA: Diagnosis not present

## 2021-01-09 DIAGNOSIS — I482 Chronic atrial fibrillation, unspecified: Secondary | ICD-10-CM | POA: Diagnosis not present

## 2021-01-09 DIAGNOSIS — R040 Epistaxis: Secondary | ICD-10-CM | POA: Diagnosis not present

## 2021-01-11 DIAGNOSIS — R7301 Impaired fasting glucose: Secondary | ICD-10-CM | POA: Diagnosis not present

## 2021-01-11 DIAGNOSIS — R3915 Urgency of urination: Secondary | ICD-10-CM | POA: Diagnosis not present

## 2021-01-11 DIAGNOSIS — R7303 Prediabetes: Secondary | ICD-10-CM | POA: Diagnosis not present

## 2021-01-11 DIAGNOSIS — I482 Chronic atrial fibrillation, unspecified: Secondary | ICD-10-CM | POA: Diagnosis not present

## 2021-01-11 DIAGNOSIS — K219 Gastro-esophageal reflux disease without esophagitis: Secondary | ICD-10-CM | POA: Diagnosis not present

## 2021-01-11 DIAGNOSIS — I10 Essential (primary) hypertension: Secondary | ICD-10-CM | POA: Diagnosis not present

## 2021-01-11 DIAGNOSIS — R040 Epistaxis: Secondary | ICD-10-CM | POA: Diagnosis not present

## 2021-01-11 DIAGNOSIS — R0602 Shortness of breath: Secondary | ICD-10-CM | POA: Diagnosis not present

## 2021-01-11 DIAGNOSIS — E782 Mixed hyperlipidemia: Secondary | ICD-10-CM | POA: Diagnosis not present

## 2021-01-11 DIAGNOSIS — Z8679 Personal history of other diseases of the circulatory system: Secondary | ICD-10-CM | POA: Diagnosis not present

## 2021-04-19 NOTE — Progress Notes (Signed)
Cardiology Office Note  Date: 04/20/2021   ID: Sean Golden, Sean Golden 05-16-1946, MRN [id number]  PCP:  Celene Squibb, MD  Cardiologist:  Rozann Lesches, MD Electrophysiologist:  None   Chief Complaint  Patient presents with   Cardiac follow-up    History of Present Illness: Sean Golden is a 75 y.o. male last seen in January.  He is here for a routine visit.  He does not report any sense of increasing palpitations.  Complains of generalized fatigue.  He is not able to walk on a regular basis for exercise due to bilateral hip pain.  He is still working at [employer].  I reviewed his medications which are noted below.  He does not report any spontaneous bleeding problems on Eliquis.  Heart rate control is good today on Cardizem CD.  He continues to follow lab work with Dr. Nevada Crane.  Past Medical History:  Diagnosis Date   Atrial fibrillation ([place]) SEP 2015   Essential hypertension    GERD (gastroesophageal reflux disease)    Helicobacter pylori gastritis Dec 2015   Treated with Pylera   Sleep apnea     Past Surgical History:  Procedure Laterality Date   CARDIAC ELECTROPHYSIOLOGY STUDY AND ABLATION  2010?   COLONOSCOPY  2005   SOLITARY RECTAL ULCER   COLONOSCOPY N/A 09/06/2014   Dr. Oneida Alar: three colon polyps, moderate diverticulosis throughout the entire examined colon   ESOPHAGOGASTRODUODENOSCOPY N/A 09/06/2014   Dr. Fields:moderate erosive gastritis, no Barrett's. +H.pylori gastritis, treated with Pylera   HEMORRHOID BANDING N/A 09/06/2014   NO BANDING PERFORMED. NO INTERNAL HEMORRHOIDS IDENTIFIED    UPPER GASTROINTESTINAL ENDOSCOPY  2005 RMR   VASECTOMY      Current Outpatient Medications  Medication Sig Dispense Refill   apixaban (ELIQUIS) 5 MG TABS tablet Take 1 tablet (5 mg total) by mouth 2 (two) times daily. 60 tablet 11   diltiazem (CARDIZEM CD) 120 MG 24 hr capsule TAKE TWO CAPSULES BY MOUTH IN THE MORNING AND ONE CAPSULE IN THE EVENING 270  capsule 3   losartan (COZAAR) 50 MG tablet TAKE ONE (1) TABLET BY MOUTH EVERY DAY 90 tablet 3   pantoprazole (PROTONIX) 40 MG tablet Take 1 tablet (40 mg total) by mouth daily. 30 tablet 11   tamsulosin (FLOMAX) 0.4 MG CAPS capsule Take 0.4 mg by mouth daily.     oxymetazoline (AFRIN NASAL SPRAY) 0.05 % nasal spray Place 1 spray into both nostrils 2 (two) times daily. (Patient not taking: Reported on 04/20/2021) 37 mL 0   No current facility-administered medications for this visit.   Allergies:  Patient has no known allergies.   ROS: No syncope.  Physical Exam: VS:  BP 128/74   Pulse 70   Ht '5\' 11"'$  (1.803 m)   Wt 255 lb 6.4 oz (115.8 kg)   SpO2 96%   BMI 35.62 kg/m , BMI Body mass index is 35.62 kg/m.  Wt Readings from Last 3 Encounters:  04/20/21 255 lb 6.4 oz (115.8 kg)  10/18/20 263 lb 3.2 oz (119.4 kg)  12/11/19 260 lb (117.9 kg)    General: Patient appears comfortable at rest. HEENT: Conjunctiva and lids normal, wearing a mask. Neck: Supple, no elevated JVP or carotid bruits, no thyromegaly. Lungs: Clear to auscultation, nonlabored breathing at rest. Cardiac: Irregularly irregular, no S3 or significant systolic murmur, no pericardial rub. Extremities: No pitting edema.  ECG:  An ECG dated 10/18/2020 was personally reviewed today and demonstrated:  Atrial fibrillation.  Recent Labwork:  December 2021: Hemoglobin 16.6, platelets 289, BUN 10, creatinine 1.08, potassium 4.4, AST 23, ALT 18, cholesterol 216, triglycerides 95, HDL 45, LDL 154, hemoglobin A1c 6.3%  Other Studies Reviewed Today:  Echocardiogram 06/09/2014: - Procedure narrative: Transthoracic echocardiography. Image    quality was suboptimal. The study was technically difficult, as a    result of poor sound wave transmission and body habitus.  - Left ventricle: The cavity size was normal. Wall thickness was    increased in a pattern of moderate LVH. Systolic function was    normal. The estimated ejection  fraction was in the range of 60%    to 65%. Wall motion was normal; there were no regional wall    motion abnormalities. The study was not technically sufficient to    allow evaluation of LV diastolic dysfunction due to atrial    fibrillation.  - Mitral valve: There was trivial regurgitation.  - Left atrium: The atrium was mildly dilated.  - Right ventricle: The cavity size was mildly to moderately    dilated.  - Right atrium: The atrium was mildly dilated.  - Tricuspid valve: There was trivial regurgitation.  - Pericardium, extracardiac: A trivial pericardial effusion was    identified.   Assessment and Plan:  1.  Permanent atrial fibrillation with CHA2DS2-VASc score of 2.  He is symptomatically stable and heart rate is well controlled on current dose of Cardizem CD.  Continue Eliquis for stroke prophylaxis and follow-up lab work with Dr. [place] Crane.  2.  Essential hypertension, blood pressure is well controlled today.  Also on Cozaar.  No changes were made.  Medication Adjustments/Labs and Tests Ordered: Current medicines are reviewed at length with the patient today.  Concerns regarding medicines are outlined above.   Tests Ordered: No orders of the defined types were placed in this encounter.   Medication Changes: No orders of the defined types were placed in this encounter.   Disposition:  Follow up  6 months.  Signed, Satira Sark, MD, [facility] 04/20/2021 11:32 AM    [facility] [place]. [address] [place], [place] [address] Phone: [phone]; Fax: [phone]

## 2021-04-20 ENCOUNTER — Ambulatory Visit: Payer: PPO | Admitting: Cardiology

## 2021-04-20 ENCOUNTER — Encounter: Payer: Self-pay | Admitting: Cardiology

## 2021-04-20 ENCOUNTER — Other Ambulatory Visit: Payer: Self-pay

## 2021-04-20 VITALS — BP 128/74 | HR 70 | Ht 71.0 in | Wt 255.4 lb

## 2021-04-20 DIAGNOSIS — I10 Essential (primary) hypertension: Secondary | ICD-10-CM | POA: Diagnosis not present

## 2021-04-20 DIAGNOSIS — I4821 Permanent atrial fibrillation: Secondary | ICD-10-CM | POA: Diagnosis not present

## 2021-04-20 NOTE — Patient Instructions (Signed)

## 2021-04-30 DIAGNOSIS — R0602 Shortness of breath: Secondary | ICD-10-CM | POA: Diagnosis not present

## 2021-04-30 DIAGNOSIS — U071 COVID-19: Secondary | ICD-10-CM | POA: Diagnosis not present

## 2021-04-30 DIAGNOSIS — R059 Cough, unspecified: Secondary | ICD-10-CM | POA: Diagnosis not present

## 2021-05-01 ENCOUNTER — Telehealth: Payer: Self-pay | Admitting: Orthopedic Surgery

## 2021-05-01 NOTE — Telephone Encounter (Signed)
Patient called to inquire about scheduling appointment with Dr Amedeo Kinsman for problem of being unable to walk distances, for exercise. States he sees Dr Jenny Reichmann 'Thedore Mins' [place] Crane for prmary care, and sees cardiologist Dr Domenic Polite. Michela Pitcher that this problem has been going on for about 5 years. States has not seen another specialist, or had Xrays.  Please advise regarding scheduling with Dr Amedeo Kinsman.

## 2021-05-03 NOTE — Telephone Encounter (Signed)
Called patient to offer; scheduled accordingly.

## 2021-05-06 ENCOUNTER — Encounter: Payer: Self-pay | Admitting: Emergency Medicine

## 2021-05-06 ENCOUNTER — Telehealth: Payer: Self-pay | Admitting: Emergency Medicine

## 2021-05-06 ENCOUNTER — Ambulatory Visit
Admission: EM | Admit: 2021-05-06 | Discharge: 2021-05-06 | Disposition: A | Discharge status: Home / Self Care | Payer: PPO | Attending: Family Medicine | Admitting: Family Medicine

## 2021-05-06 DIAGNOSIS — R066 Hiccough: Secondary | ICD-10-CM

## 2021-05-06 MED ORDER — SUCRALFATE 1 GM/10ML PO SUSP: 1.0000 g | Freq: Three times a day (TID) | ORAL | 0 refills | Status: DC

## 2021-05-06 MED ORDER — BACLOFEN 10 MG PO TABS: 10.0000 mg | ORAL_TABLET | Freq: Two times a day (BID) | ORAL | 0 refills | Status: AC

## 2021-05-06 MED ORDER — BACLOFEN 10 MG PO TABS: 10.0000 mg | ORAL_TABLET | Freq: Two times a day (BID) | ORAL | 0 refills | Status: DC

## 2021-05-06 NOTE — ED Triage Notes (Signed)
Hiccups since Wednesday.  Pt dx with covid s/s started Monday.  Hiccups started after taking meds prescribed tessalon pearls and dexamethasone

## 2021-05-06 NOTE — Discharge Instructions (Addendum)
Continue all other home medications.  Start baclofen twice daily for 5 days medication can cause severe drowsiness therefore avoid operate motor vehicle while taking medication. Carafate take 3 times daily with meals for the next 5 days. Hydrate well with fluids.  If hiccups continue in spite of treatment follow-up with primary care doctor or go to the nearest emergency department.  Discontinue steroid dexamethasone as I suspect this is likely the source of the hiccups.

## 2021-05-06 NOTE — ED Provider Notes (Signed)
[facility]    CSN: [id number] Arrival date & time: 05/06/21  [id number]      History   Chief Complaint No chief complaint on file.   HPI Sean Golden is a 75 y.o. male.   HPI Patient presents today for evaluation of persistent hiccups that have been present for 5 days.  Patient was recently diagnosed with COVID-19 and subsequently was started on dexamethasone.  After his second dose he experienced severe hiccups that have gradually persisted and have not resolved with conservative measures. He discontinued taking the dexamethasone along with the benzonatate as he was uncertain of which specifically because the hiccups.  He initially had a reaction with the dexamethasone of insomnia with the first dose and subsequently split the dose in half and subsequently developed hiccups erupted.  He is not experiencing any chest pain, headache, dizziness, or shortness of breath.  He endorses throat irritation but not having any soreness of throat. Past Medical History:  Diagnosis Date   Atrial fibrillation ([place]) SEP 2015   Essential hypertension    GERD (gastroesophageal reflux disease)    Helicobacter pylori gastritis Dec 2015   Treated with Pylera   Sleep apnea     Patient Active Problem List   Diagnosis Date Noted   DOE (dyspnea on exertion) 10/27/2019   Elevated glucose 123456   Helicobacter pylori gastritis 12/06/2014   History of colonic polyps 12/06/2014   Rectal bleeding 08/24/2014   GERD (gastroesophageal reflux disease) 08/24/2014   Atrial fibrillation ([place]) 05/27/2014   Aortic root enlargement ([place]) 05/27/2014   Sleep apnea 05/27/2014   Atrial flutter ([place]) 05/18/2009   FATIGUE / MALAISE 05/18/2009   Essential hypertension 05/09/2009    Past Surgical History:  Procedure Laterality Date   CARDIAC ELECTROPHYSIOLOGY STUDY AND ABLATION  2010?   COLONOSCOPY  2005   SOLITARY RECTAL ULCER   COLONOSCOPY N/A 09/06/2014   Dr. Oneida Alar: three colon polyps, moderate  diverticulosis throughout the entire examined colon   ESOPHAGOGASTRODUODENOSCOPY N/A 09/06/2014   Dr. Fields:moderate erosive gastritis, no Barrett's. +H.pylori gastritis, treated with Pylera   HEMORRHOID BANDING N/A 09/06/2014   NO BANDING PERFORMED. NO INTERNAL HEMORRHOIDS IDENTIFIED    UPPER GASTROINTESTINAL ENDOSCOPY  2005 RMR   VASECTOMY         Home Medications    Prior to Admission medications   Medication Sig Start Date End Date Taking? Authorizing Provider  baclofen (LIORESAL) 10 MG tablet Take 1 tablet (10 mg total) by mouth 2 (two) times daily for 5 days. 05/06/21 05/11/21 Yes Scot Jun, FNP  sucralfate (CARAFATE) 1 GM/10ML suspension Take 10 mLs (1 g total) by mouth 3 (three) times daily with meals for 5 days. 05/06/21 05/11/21 Yes Scot Jun, FNP  apixaban (ELIQUIS) 5 MG TABS tablet Take 1 tablet (5 mg total) by mouth 2 (two) times daily. 07/25/20   Strader, Fransisco Hertz, PA-C  diltiazem (CARDIZEM CD) 120 MG 24 hr capsule TAKE TWO CAPSULES BY MOUTH IN THE MORNING AND ONE CAPSULE IN THE EVENING 10/25/20   Satira Sark, MD  losartan (COZAAR) 50 MG tablet TAKE ONE (1) TABLET BY MOUTH EVERY DAY 12/18/20   Satira Sark, MD  oxymetazoline (AFRIN NASAL SPRAY) 0.05 % nasal spray Place 1 spray into both nostrils 2 (two) times daily. Patient not taking: Reported on 04/20/2021 12/10/19   [place], [place], PA-C  pantoprazole (PROTONIX) 40 MG tablet Take 1 tablet (40 mg total) by mouth daily. 09/30/15   Mahala Menghini, PA-C  tamsulosin (FLOMAX) 0.4 MG CAPS capsule Take 0.4 mg by mouth daily.    [provider]    Family History Family History  Problem Relation Age of Onset   Hypertension Mother    Heart attack Father    Hyperlipidemia Sister    Hyperlipidemia Brother    Colon polyps Brother    Hypertension Brother    Hypertension Sister    Diabetes Brother    Stroke Other    Diabetes Other    Hypertension Other    Hyperlipidemia Other    Colon cancer  Neg Hx     Social History Social History   Tobacco Use   Smoking status: Former    Packs/day: 0.50    Years: 15.00    Pack years: 7.50    Types: Cigarettes    Start date: 05/28/1967    Quit date: 05/27/1984    Years since quitting: 36.9   Smokeless tobacco: Never  Vaping Use   Vaping Use: Never used  Substance Use Topics   Alcohol use: Yes    Alcohol/week: 14.0 standard drinks    Types: 14 Glasses of wine per week   Drug use: No     Allergies   Patient has no known allergies.   Review of Systems Review of Systems Pertinent negatives listed in HPI  Physical Exam Triage Vital Signs ED Triage Vitals [05/06/21 0931]  Enc Vitals Group     BP 122/81     Pulse Rate 78     Resp 18     Temp 98.6 F (37 C)     Temp Source Oral     SpO2 96 %     Weight      Height      Head Circumference      Peak Flow      Pain Score 0     Pain Loc      Pain Edu?      Excl. in [place]?    No data found.  Updated Vital Signs BP 122/81 (BP Location: Right Arm)   Pulse 78   Temp 98.6 F (37 C) (Oral)   Resp 18   SpO2 96%   Visual Acuity Right Eye Distance:   Left Eye Distance:   Bilateral Distance:    Right Eye Near:   Left Eye Near:    Bilateral Near:     Physical Exam General Appearance:    Alert, cooperative, no distress  HENT: Normocephalic, no neck nodes or sinus tenderness  Eyes:    PERRL, conjunctiva/corneas clear, EOM's intact       Lungs:     Clear to auscultation bilaterally, respirations unlabored  Heart:    Regular rate and  regular-irregular rhythm  Neurologic:   Awake, alert, oriented x 3. No apparent focal neurological           defect.         UC Treatments / Results  Labs (all labs ordered are listed, but only abnormal results are displayed) Labs Reviewed - No data to display  EKG   Radiology No results found.  Procedures Procedures (including critical care time)  Medications Ordered in UC Medications - No data to display  Initial  Impression / Assessment and Plan / UC Course  I have reviewed the triage vital signs and the nursing notes.  Pertinent labs & imaging results that were available during my care of the patient were reviewed by me and considered in my medical decision making (see  chart for details).    Intractable hiccups treatment with baclofen twice daily for 5 days along with Carafate 10 mL 3 times daily with meals.  Patient will continue his Protonix.  Suspect hiccups is likely related to dexamethasone reaction or possibly even COVID as both of have been linked to intractable hiccups.  Red flag precautions discussed.  Patient verbalized understanding and agreement with treatment plan.  Advised to avoid driving while taking baclofen as medication can cause severe drowsiness.   Final Clinical Impressions(s) / UC Diagnoses   Final diagnoses:  Intractable hiccups     Discharge Instructions      Continue all other home medications.  Start baclofen twice daily for 5 days medication can cause severe drowsiness therefore avoid operate motor vehicle while taking medication. Carafate take 3 times daily with meals for the next 5 days. Hydrate well with fluids.  If hiccups continue in spite of treatment follow-up with primary care doctor or go to the nearest emergency department.  Discontinue steroid dexamethasone as I suspect this is likely the source of the hiccups.   ED Prescriptions     Medication Sig Dispense Auth. Provider   baclofen (LIORESAL) 10 MG tablet Take 1 tablet (10 mg total) by mouth 2 (two) times daily for 5 days. 10 tablet Scot Jun, FNP   sucralfate (CARAFATE) 1 GM/10ML suspension Take 10 mLs (1 g total) by mouth 3 (three) times daily with meals for 5 days. 150 mL Scot Jun, FNP      PDMP not reviewed this encounter.   Scot Jun, FNP 05/06/21 1039

## 2021-05-15 ENCOUNTER — Ambulatory Visit: Payer: PPO | Admitting: Orthopedic Surgery

## 2021-05-15 ENCOUNTER — Other Ambulatory Visit: Payer: Self-pay

## 2021-05-15 ENCOUNTER — Encounter: Payer: Self-pay | Admitting: Orthopedic Surgery

## 2021-05-15 ENCOUNTER — Ambulatory Visit: Payer: PPO

## 2021-05-15 VITALS — BP 161/97 | HR 88 | Ht 71.0 in | Wt 250.0 lb

## 2021-05-15 DIAGNOSIS — M545 Low back pain, unspecified: Secondary | ICD-10-CM

## 2021-05-15 MED ORDER — TRAMADOL HCL 50 MG PO TABS: 50.0000 mg | ORAL_TABLET | Freq: Two times a day (BID) | ORAL | 0 refills | Status: DC | PRN

## 2021-05-15 NOTE — Progress Notes (Signed)
New Patient Visit  Assessment: Sean Golden is a 75 y.o. male with the following: 1. Lumbar pain; no radiculopathy.  No anterior listhesis, but degenerative changes at L5-S1.  Plan: Reviewed radiographs with the patient in clinic today, which demonstrates well-maintained disc height, with the exception of L5-S1.  His pain is consistent with degenerative changes in this area.  No concern for radiculopathy, as he has no symptoms radiating into either leg or his feet.  I do think he would benefit from physical therapy to improve his endurance, and I prescribed him some tramadol for his pain.  Follow-up as needed.   Follow-up: Return if symptoms worsen or fail to improve.  Subjective:  Chief Complaint  Patient presents with   Back Pain    Patient states hip pain, points to SI joint area bilateral     History of Present Illness: Sean Golden is a 75 y.o. male who presents for evaluation of low back pain.  He states he has pain in the buttock area bilaterally, which is been ongoing for several years.  After he is been on his feet, walking for short distances, the pain gets worse.  No radiating pain into either leg.  No numbness or tingling.  No issues with weakness.  No injury to his lower back.  He has not worked with physical therapy.  He takes Tylenol occasionally.  He cannot take NSAIDs due to a history of A. fib, on Eliquis.   Review of Systems: No fevers or chills No numbness or tingling No chest pain No shortness of breath No bowel or bladder dysfunction No GI distress No headaches   Medical History:  Past Medical History:  Diagnosis Date   Atrial fibrillation ([place]) SEP 2015   Essential hypertension    GERD (gastroesophageal reflux disease)    Helicobacter pylori gastritis Dec 2015   Treated with Pylera   Sleep apnea     Past Surgical History:  Procedure Laterality Date   CARDIAC ELECTROPHYSIOLOGY STUDY AND ABLATION  2010?   COLONOSCOPY  2005   SOLITARY RECTAL  ULCER   COLONOSCOPY N/A 09/06/2014   Dr. Oneida Alar: three colon polyps, moderate diverticulosis throughout the entire examined colon   ESOPHAGOGASTRODUODENOSCOPY N/A 09/06/2014   Dr. Fields:moderate erosive gastritis, no Barrett's. +H.pylori gastritis, treated with Pylera   HEMORRHOID BANDING N/A 09/06/2014   NO BANDING PERFORMED. NO INTERNAL HEMORRHOIDS IDENTIFIED    UPPER GASTROINTESTINAL ENDOSCOPY  2005 RMR   VASECTOMY      Family History  Problem Relation Age of Onset   Hypertension Mother    Heart attack Father    Hyperlipidemia Sister    Hyperlipidemia Brother    Colon polyps Brother    Hypertension Brother    Hypertension Sister    Diabetes Brother    Stroke Other    Diabetes Other    Hypertension Other    Hyperlipidemia Other    Colon cancer Neg Hx    Social History   Tobacco Use   Smoking status: Former    Packs/day: 0.50    Years: 15.00    Pack years: 7.50    Types: Cigarettes    Start date: 05/28/1967    Quit date: 05/27/1984    Years since quitting: 36.9   Smokeless tobacco: Never  Vaping Use   Vaping Use: Never used  Substance Use Topics   Alcohol use: Yes    Alcohol/week: 14.0 standard drinks    Types: 14 Glasses of wine per week   Drug  use: No    No Known Allergies  Current Meds  Medication Sig   apixaban (ELIQUIS) 5 MG TABS tablet Take 1 tablet (5 mg total) by mouth 2 (two) times daily.   baclofen (LIORESAL) 10 MG tablet baclofen 10 mg tablet   diltiazem (CARDIZEM CD) 120 MG 24 hr capsule TAKE TWO CAPSULES BY MOUTH IN THE MORNING AND ONE CAPSULE IN THE EVENING   losartan (COZAAR) 50 MG tablet TAKE ONE (1) TABLET BY MOUTH EVERY DAY   pantoprazole (PROTONIX) 40 MG tablet Take 1 tablet (40 mg total) by mouth daily.   tamsulosin (FLOMAX) 0.4 MG CAPS capsule Take 0.4 mg by mouth daily.   traMADol (ULTRAM) 50 MG tablet Take 1 tablet (50 mg total) by mouth every 12 (twelve) hours as needed.    Objective: BP (!) 161/97   Pulse 88   Ht '5\' 11"'$  (1.803 m)    Wt 250 lb (113.4 kg)   BMI 34.87 kg/m   Physical Exam:  General: Elderly male., Alert and oriented., and No acute distress. Gait: Slow, steady gait.  Evaluation of back demonstrates no deformity.  Diffuse tenderness to palpation along the lower back.  Negative straight leg raise bilaterally.  5/5 strength throughout the bilateral lower extremity.  Sensation is intact throughout the lower extremities.  2+ patellar tendon reflexes bilaterally.  IMAGING: I personally ordered and reviewed the following images  Standing x-rays of the lumbar spine were obtained in clinic today and demonstrates no acute injuries.  No evidence of anterolisthesis.  No obvious scoliosis.  Well-maintained disc height with the exception of L5-S1.  There are noticeable degenerative changes at this level.  Small osteophytes.  Loss of disc height.  Endplates are sclerotic.  Impression: L5-S1 spondylarthritis.  No anterolisthesis.  New Medications:  Meds ordered this encounter  Medications   traMADol (ULTRAM) 50 MG tablet    Sig: Take 1 tablet (50 mg total) by mouth every 12 (twelve) hours as needed.    Dispense:  28 tablet    Refill:  0      Mordecai Rasmussen, MD  05/15/2021 9:31 AM

## 2021-05-22 ENCOUNTER — Other Ambulatory Visit: Payer: Self-pay

## 2021-05-22 ENCOUNTER — Emergency Department (HOSPITAL_COMMUNITY)
Admission: EM | Admit: 2021-05-22 | Discharge: 2021-05-22 | Disposition: A | Discharge status: Home / Self Care | Payer: PPO | Attending: Emergency Medicine | Admitting: Emergency Medicine

## 2021-05-22 ENCOUNTER — Encounter (HOSPITAL_COMMUNITY): Payer: Self-pay | Admitting: Emergency Medicine

## 2021-05-22 ENCOUNTER — Emergency Department (HOSPITAL_COMMUNITY): Payer: PPO

## 2021-05-22 DIAGNOSIS — H547 Unspecified visual loss: Secondary | ICD-10-CM | POA: Diagnosis not present

## 2021-05-22 DIAGNOSIS — Z79899 Other long term (current) drug therapy: Secondary | ICD-10-CM | POA: Insufficient documentation

## 2021-05-22 DIAGNOSIS — U071 COVID-19: Secondary | ICD-10-CM | POA: Diagnosis not present

## 2021-05-22 DIAGNOSIS — H35032 Hypertensive retinopathy, left eye: Secondary | ICD-10-CM | POA: Diagnosis not present

## 2021-05-22 DIAGNOSIS — H539 Unspecified visual disturbance: Secondary | ICD-10-CM

## 2021-05-22 DIAGNOSIS — H53451 Other localized visual field defect, right eye: Secondary | ICD-10-CM | POA: Diagnosis not present

## 2021-05-22 DIAGNOSIS — H53149 Visual discomfort, unspecified: Secondary | ICD-10-CM | POA: Insufficient documentation

## 2021-05-22 DIAGNOSIS — R9082 White matter disease, unspecified: Secondary | ICD-10-CM | POA: Insufficient documentation

## 2021-05-22 DIAGNOSIS — Z7901 Long term (current) use of anticoagulants: Secondary | ICD-10-CM | POA: Insufficient documentation

## 2021-05-22 DIAGNOSIS — Z8616 Personal history of COVID-19: Secondary | ICD-10-CM | POA: Diagnosis not present

## 2021-05-22 DIAGNOSIS — H53131 Sudden visual loss, right eye: Secondary | ICD-10-CM | POA: Diagnosis not present

## 2021-05-22 DIAGNOSIS — Z87891 Personal history of nicotine dependence: Secondary | ICD-10-CM | POA: Insufficient documentation

## 2021-05-22 DIAGNOSIS — H35342 Macular cyst, hole, or pseudohole, left eye: Secondary | ICD-10-CM | POA: Diagnosis not present

## 2021-05-22 DIAGNOSIS — H35371 Puckering of macula, right eye: Secondary | ICD-10-CM | POA: Diagnosis not present

## 2021-05-22 DIAGNOSIS — H3562 Retinal hemorrhage, left eye: Secondary | ICD-10-CM | POA: Diagnosis not present

## 2021-05-22 DIAGNOSIS — I10 Essential (primary) hypertension: Secondary | ICD-10-CM | POA: Insufficient documentation

## 2021-05-22 DIAGNOSIS — I4891 Unspecified atrial fibrillation: Secondary | ICD-10-CM | POA: Diagnosis not present

## 2021-05-22 DIAGNOSIS — H5334 Suppression of binocular vision: Secondary | ICD-10-CM | POA: Diagnosis not present

## 2021-05-22 LAB — DIFFERENTIAL
Abs Immature Granulocytes: 0.04 10*3/uL (ref 0.00–0.07)
Basophils Absolute: 0 10*3/uL (ref 0.0–0.1)
Basophils Relative: 1 %
Eosinophils Absolute: 0.1 10*3/uL (ref 0.0–0.5)
Eosinophils Relative: 1 %
Immature Granulocytes: 1 %
Lymphocytes Relative: 23 %
Lymphs Abs: 1.7 10*3/uL (ref 0.7–4.0)
Monocytes Absolute: 0.8 10*3/uL (ref 0.1–1.0)
Monocytes Relative: 11 %
Neutro Abs: 4.7 10*3/uL (ref 1.7–7.7)
Neutrophils Relative %: 63 %

## 2021-05-22 LAB — PROTIME-INR
INR: 1.2 (ref 0.8–1.2)
Prothrombin Time: 15.3 seconds — ABNORMAL HIGH (ref 11.4–15.2)

## 2021-05-22 LAB — COMPREHENSIVE METABOLIC PANEL
ALT: 21 U/L (ref 0–44)
AST: 22 U/L (ref 15–41)
Albumin: 3.6 g/dL (ref 3.5–5.0)
Alkaline Phosphatase: 63 U/L (ref 38–126)
Anion gap: 8 (ref 5–15)
BUN: 12 mg/dL (ref 8–23)
CO2: 21 mmol/L — ABNORMAL LOW (ref 22–32)
Calcium: 9 mg/dL (ref 8.9–10.3)
Chloride: 109 mmol/L (ref 98–111)
Creatinine, Ser: 1.09 mg/dL (ref 0.61–1.24)
GFR, Estimated: >60 mL/min (ref >60)
Glucose, Bld: 148 mg/dL — ABNORMAL HIGH (ref 70–99)
Potassium: 3.8 mmol/L (ref 3.5–5.1)
Sodium: 138 mmol/L (ref 135–145)
Total Bilirubin: 1.2 mg/dL (ref 0.3–1.2)
Total Protein: 7.1 g/dL (ref 6.5–8.1)

## 2021-05-22 LAB — APTT: aPTT: 34 seconds (ref 24–36)

## 2021-05-22 LAB — CBC
HCT: 46.7 % (ref 39.0–52.0)
Hemoglobin: 16.2 g/dL (ref 13.0–17.0)
MCH: 33.1 pg (ref 26.0–34.0)
MCHC: 34.7 g/dL (ref 30.0–36.0)
MCV: 95.5 fL (ref 80.0–100.0)
Platelets: 305 10*3/uL (ref 150–400)
RBC: 4.89 MIL/uL (ref 4.22–5.81)
RDW: 13.2 % (ref 11.5–15.5)
WBC: 7.3 10*3/uL (ref 4.0–10.5)
nRBC: 0 % (ref 0.0–0.2)

## 2021-05-22 LAB — URINALYSIS, ROUTINE W REFLEX MICROSCOPIC
Bilirubin Urine: NEGATIVE
Glucose, UA: NEGATIVE mg/dL
Hgb urine dipstick: NEGATIVE
Ketones, ur: NEGATIVE mg/dL
Leukocytes,Ua: NEGATIVE
Nitrite: NEGATIVE
Protein, ur: NEGATIVE mg/dL
Specific Gravity, Urine: 1.017 (ref 1.005–1.030)
pH: 5 (ref 5.0–8.0)

## 2021-05-22 LAB — RAPID URINE DRUG SCREEN, HOSP PERFORMED
Amphetamines: NOT DETECTED
Barbiturates: NOT DETECTED
Benzodiazepines: NOT DETECTED
Cocaine: NOT DETECTED
Opiates: NOT DETECTED
Tetrahydrocannabinol: NOT DETECTED

## 2021-05-22 LAB — RESP PANEL BY RT-PCR (FLU A&B, COVID) ARPGX2
Influenza A by PCR: NEGATIVE
Influenza B by PCR: NEGATIVE
SARS Coronavirus 2 by RT PCR: POSITIVE — AB

## 2021-05-22 MED ORDER — GADOBUTROL 1 MMOL/ML IV SOLN
10.0000 mL | Freq: Once | INTRAVENOUS | Status: AC | PRN
  Administered 2021-05-22: 10 mL via INTRAVENOUS

## 2021-05-22 NOTE — ED Provider Notes (Signed)
Emergency Medicine Provider Triage Evaluation Note  Sean Golden , a 75 y.o. male  was evaluated in triage.  Pt complains of decreased vision to the right eye starting 5 days ago. Denies other neuro complaints. Seen by ophtho pta and sent here for further eval.  Review of Systems  Positive: Vision loss Negative: Numbness/weakness  Physical Exam  BP (!) 149/86 (BP Location: Right Arm)   Pulse 97   Temp 98.6 F (37 C) (Oral)   Resp 14   SpO2 93%  Gen:   Awake, no distress   Resp:  Normal effort  MSK:   Moves extremities without difficulty  Other:  5/5 strength to the bue/ble, no facial droop, clear speech  Medical Decision Making  Medically screening exam initiated at 3:15 PM.  Appropriate orders placed.  Sean Golden was informed that the remainder of the evaluation will be completed by another provider, this initial triage assessment does not replace that evaluation, and the importance of remaining in the ED until their evaluation is complete.     Bishop Dublin 05/22/21 1516    Truddie Hidden, MD 05/22/21 [phone]

## 2021-05-22 NOTE — ED Triage Notes (Signed)
Pt reports loss of vision to right eye that started on Friday. Reports intermittent vision changes "for a while", but did not resolve on Friday. Denies headaches, weakness or other neuro changes. A&O x 4.

## 2021-05-22 NOTE — ED Notes (Signed)
Patient transported to MRI 

## 2021-05-22 NOTE — Discharge Instructions (Addendum)
Your MRI did not show a stroke.  Please follow-up with your ophthalmologist in the office.  I have provided you with information for the ophthalmologist on-call.

## 2021-05-22 NOTE — ED Notes (Signed)
Patient transported to CT 

## 2021-05-22 NOTE — ED Provider Notes (Signed)
[facility] EMERGENCY DEPARTMENT Provider Note   CSN: [id number] Arrival date & time: 05/22/21  1507     History Chief Complaint  Patient presents with   Visual Field Change    Sean Golden is a 75 y.o. male.  75 yo M with a chief complaints of trouble with his vision.  Patient feels like he has a booger over his eyes.  Has tried taking his antibiotic ointment without improvement.  Had appointment today with his ophthalmologist and they did not feel that it was likely ophthalmologic issue and sent him here for evaluation.  He denies headache denies neck pain denies trauma to the eye.  Denies one-sided numbness or weakness denies difficulty speech or swallowing.  He denies any specific visual field affected.  Does not change if he closes 1 eye or the other.  He does feel like his vision of his left eye is better than the right.  He has had this happen to him before in the past and usually he blinks a few times that improves.  The history is provided by the patient.  Illness Severity:  Moderate Onset quality:  Gradual Duration:  4 days Timing:  Constant Progression:  Unchanged Chronicity:  Recurrent Associated symptoms: no abdominal pain, no chest pain, no congestion, no diarrhea, no fever, no headaches, no myalgias, no rash, no shortness of breath and no vomiting       Past Medical History:  Diagnosis Date   Atrial fibrillation ([place]) SEP 2015   Essential hypertension    GERD (gastroesophageal reflux disease)    Helicobacter pylori gastritis Dec 2015   Treated with Pylera   Sleep apnea     Patient Active Problem List   Diagnosis Date Noted   COVID-19 04/30/2021   DOE (dyspnea on exertion) 10/27/2019   Elevated glucose 123456   Helicobacter pylori gastritis 12/06/2014   History of colonic polyps 12/06/2014   Rectal bleeding 08/24/2014   GERD (gastroesophageal reflux disease) 08/24/2014   Atrial fibrillation ([place]) 05/27/2014   Aortic root  enlargement ([place]) 05/27/2014   Sleep apnea 05/27/2014   Atrial flutter ([place]) 05/18/2009   FATIGUE / MALAISE 05/18/2009   Essential hypertension 05/09/2009    Past Surgical History:  Procedure Laterality Date   CARDIAC ELECTROPHYSIOLOGY STUDY AND ABLATION  2010?   COLONOSCOPY  2005   SOLITARY RECTAL ULCER   COLONOSCOPY N/A 09/06/2014   Dr. Oneida Alar: three colon polyps, moderate diverticulosis throughout the entire examined colon   ESOPHAGOGASTRODUODENOSCOPY N/A 09/06/2014   Dr. Fields:moderate erosive gastritis, no Barrett's. +H.pylori gastritis, treated with Pylera   HEMORRHOID BANDING N/A 09/06/2014   NO BANDING PERFORMED. NO INTERNAL HEMORRHOIDS IDENTIFIED    UPPER GASTROINTESTINAL ENDOSCOPY  2005 RMR   VASECTOMY         Family History  Problem Relation Age of Onset   Hypertension Mother    Heart attack Father    Hyperlipidemia Sister    Hyperlipidemia Brother    Colon polyps Brother    Hypertension Brother    Hypertension Sister    Diabetes Brother    Stroke Other    Diabetes Other    Hypertension Other    Hyperlipidemia Other    Colon cancer Neg Hx     Social History   Tobacco Use   Smoking status: Former    Packs/day: 0.50    Years: 15.00    Pack years: 7.50    Types: Cigarettes    Start date: 05/28/1967    Quit  date: 05/27/1984    Years since quitting: 37.0   Smokeless tobacco: Never  Vaping Use   Vaping Use: Never used  Substance Use Topics   Alcohol use: Yes    Alcohol/week: 14.0 standard drinks    Types: 14 Glasses of wine per week   Drug use: No    Home Medications Prior to Admission medications   Medication Sig Start Date End Date Taking? Authorizing Provider  apixaban (ELIQUIS) 5 MG TABS tablet Take 1 tablet (5 mg total) by mouth 2 (two) times daily. 07/25/20  Yes Strader, [place] M, PA-C  diltiazem (CARDIZEM CD) 120 MG 24 hr capsule TAKE TWO CAPSULES BY MOUTH IN THE MORNING AND ONE CAPSULE IN THE EVENING 10/25/20  Yes Satira Sark, MD   losartan (COZAAR) 50 MG tablet TAKE ONE (1) TABLET BY MOUTH EVERY DAY 12/18/20  Yes Satira Sark, MD  Omega-3 Fatty Acids (FISH OIL) 1000 MG CAPS Take 1 capsule by mouth daily.   Yes [provider]  pantoprazole (PROTONIX) 40 MG tablet Take 1 tablet (40 mg total) by mouth daily. Patient taking differently: Take 40 mg by mouth at bedtime. 09/30/15  Yes Mahala Menghini, PA-C  prednisoLONE acetate (PRED FORTE) 1 % ophthalmic suspension Place 1 drop into both eyes in the morning and at bedtime. 05/18/21  Yes [provider]  tamsulosin (FLOMAX) 0.4 MG CAPS capsule Take 0.4 mg by mouth every evening.   Yes [provider]  traMADol (ULTRAM) 50 MG tablet Take 1 tablet (50 mg total) by mouth every 12 (twelve) hours as needed. Patient taking differently: Take 50 mg by mouth every 12 (twelve) hours as needed for moderate pain or severe pain. 05/15/21  Yes Mordecai Rasmussen, MD    Allergies    Patient has no known allergies.  Review of Systems   Review of Systems  Constitutional:  Negative for chills and fever.  HENT:  Negative for congestion and facial swelling.   Eyes:  Positive for visual disturbance. Negative for discharge.  Respiratory:  Negative for shortness of breath.   Cardiovascular:  Negative for chest pain and palpitations.  Gastrointestinal:  Negative for abdominal pain, diarrhea and vomiting.  Musculoskeletal:  Negative for arthralgias and myalgias.  Skin:  Negative for color change and rash.  Neurological:  Negative for tremors, syncope and headaches.  Psychiatric/Behavioral:  Negative for confusion and dysphoric mood.    Physical Exam Updated Vital Signs BP (!) 144/109   Pulse 78   Temp 98.6 F (37 C) (Oral)   Resp (!) 21   SpO2 94%   Physical Exam Vitals and nursing note reviewed.  Constitutional:      Appearance: He is well-developed.  HENT:     Head: Normocephalic and atraumatic.  Eyes:     Pupils: Pupils are equal, round, and reactive to  light.  Neck:     Vascular: No JVD.  Cardiovascular:     Rate and Rhythm: Normal rate and regular rhythm.     Heart sounds: No murmur heard.   No friction rub. No gallop.  Pulmonary:     Effort: No respiratory distress.     Breath sounds: No wheezing.  Abdominal:     General: There is no distension.     Tenderness: There is no abdominal tenderness. There is no guarding or rebound.  Musculoskeletal:        General: Normal range of motion.     Cervical back: Normal range of motion and neck supple.  Skin:    Coloration: Skin is not pale.     Findings: No rash.  Neurological:     Mental Status: He is alert and oriented to person, place, and time.     GCS: GCS eye subscore is 4. GCS verbal subscore is 5. GCS motor subscore is 6.     Cranial Nerves: Cranial nerves are intact.     Sensory: Sensation is intact.     Motor: Motor function is intact.     Coordination: Coordination is intact.     Comments: Intention tremor otherwise no other specific finding.  Psychiatric:        Behavior: Behavior normal.    ED Results / Procedures / Treatments   Labs (all labs ordered are listed, but only abnormal results are displayed) Labs Reviewed  RESP PANEL BY RT-PCR (FLU A&B, COVID) ARPGX2 - Abnormal; Notable for the following components:      Result Value   SARS Coronavirus 2 by RT PCR POSITIVE (*)    All other components within normal limits  PROTIME-INR - Abnormal; Notable for the following components:   Prothrombin Time 15.3 (*)    All other components within normal limits  COMPREHENSIVE METABOLIC PANEL - Abnormal; Notable for the following components:   CO2 21 (*)    Glucose, Bld 148 (*)    All other components within normal limits  URINALYSIS, ROUTINE W REFLEX MICROSCOPIC - Abnormal; Notable for the following components:   APPearance HAZY (*)    All other components within normal limits  APTT  CBC  DIFFERENTIAL  RAPID URINE DRUG SCREEN, HOSP PERFORMED  ETHANOL    EKG EKG  Interpretation  Date/Time:  Tuesday May 22 2021 15:09:17 EDT Ventricular Rate:  114 PR Interval:  150 QRS Duration: 62 QT Interval:  304 QTC Calculation: 419 R Axis:   -29 Text Interpretation: afib vs flutter Low voltage QRS Inferior infarct , age undetermined Cannot rule out Anterior infarct , age undetermined Abnormal ECG No significant change since last tracing Confirmed by Deno Etienne [id number]) on 05/22/2021 4:11:10 PM  Radiology CT HEAD WO CONTRAST  Result Date: 05/22/2021 CLINICAL DATA:  Monocular vision loss. EXAM: CT HEAD WITHOUT CONTRAST TECHNIQUE: Contiguous axial images were obtained from the base of the skull through the vertex without intravenous contrast. COMPARISON:  None. FINDINGS: Brain: No evidence of acute infarction, hemorrhage, hydrocephalus, extra-axial collection or mass lesion/mass effect. Vascular: No hyperdense vessel or unexpected calcification. Skull: Normal. Negative for fracture or focal lesion. Sinuses/Orbits: No acute finding. Other: None. IMPRESSION: No acute intracranial pathology. Electronically Signed   By: Virgina Norfolk M.D.   On: 05/22/2021 17:01   MR BRAIN W WO CONTRAST  Result Date: 05/22/2021 CLINICAL DATA:  Binocular vision loss EXAM: MRI HEAD AND ORBITS WITHOUT AND WITH CONTRAST TECHNIQUE: Multiplanar, multiecho pulse sequences of the brain and surrounding structures were obtained without and with intravenous contrast. Multiplanar, multiecho pulse sequences of the orbits and surrounding structures were obtained including fat saturation techniques, before and after intravenous contrast administration. CONTRAST:  67m GADAVIST GADOBUTROL 1 MMOL/ML IV SOLN COMPARISON:  None. FINDINGS: MRI HEAD FINDINGS Brain: No acute infarct, mass effect or extra-axial collection. No acute or chronic hemorrhage. There is multifocal hyperintense T2-weighted signal within the white matter. Generalized volume loss without a clear lobar predilection. The midline structures  are normal. Vascular: Major flow voids are preserved. Skull and upper cervical spine: Normal calvarium and skull base. Visualized upper cervical spine and soft tissues are normal. Sinuses/Orbits:No paranasal sinus  fluid levels or advanced mucosal thickening. No mastoid or middle ear effusion. Normal orbits. MRI ORBITS FINDINGS Orbits: --Globes: Normal. --Bony orbit: Normal. --Preseptal soft tissues: Normal. --Intra- and extraconal orbital fat: Normal. No inflammatory stranding. --Optic nerves: Normal. --Lacrimal glands and fossae: Normal. --Extraocular muscles: Normal. Visualized sinuses:  No fluid levels or advanced mucosal thickening. Soft tissues: Normal. Limited intracranial: Normal. IMPRESSION: 1. Mild chronic small vessel disease but otherwise normal aging brain. 2. Normal orbits. Electronically Signed   By: Ulyses Jarred M.D.   On: 05/22/2021 21:53   MR ORBITS W WO CONTRAST  Result Date: 05/22/2021 CLINICAL DATA:  Binocular vision loss EXAM: MRI HEAD AND ORBITS WITHOUT AND WITH CONTRAST TECHNIQUE: Multiplanar, multiecho pulse sequences of the brain and surrounding structures were obtained without and with intravenous contrast. Multiplanar, multiecho pulse sequences of the orbits and surrounding structures were obtained including fat saturation techniques, before and after intravenous contrast administration. CONTRAST:  22m GADAVIST GADOBUTROL 1 MMOL/ML IV SOLN COMPARISON:  None. FINDINGS: MRI HEAD FINDINGS Brain: No acute infarct, mass effect or extra-axial collection. No acute or chronic hemorrhage. There is multifocal hyperintense T2-weighted signal within the white matter. Generalized volume loss without a clear lobar predilection. The midline structures are normal. Vascular: Major flow voids are preserved. Skull and upper cervical spine: Normal calvarium and skull base. Visualized upper cervical spine and soft tissues are normal. Sinuses/Orbits:No paranasal sinus fluid levels or advanced mucosal  thickening. No mastoid or middle ear effusion. Normal orbits. MRI ORBITS FINDINGS Orbits: --Globes: Normal. --Bony orbit: Normal. --Preseptal soft tissues: Normal. --Intra- and extraconal orbital fat: Normal. No inflammatory stranding. --Optic nerves: Normal. --Lacrimal glands and fossae: Normal. --Extraocular muscles: Normal. Visualized sinuses:  No fluid levels or advanced mucosal thickening. Soft tissues: Normal. Limited intracranial: Normal. IMPRESSION: 1. Mild chronic small vessel disease but otherwise normal aging brain. 2. Normal orbits. Electronically Signed   By: KUlyses JarredM.D.   On: 05/22/2021 21:53    Procedures Procedures   Medications Ordered in ED Medications  gadobutrol (GADAVIST) 1 MMOL/ML injection 10 mL (10 mLs Intravenous Contrast Given 05/22/21 2121)  gadobutrol (GADAVIST) 1 MMOL/ML injection 10 mL (10 mLs Intravenous Contrast Given 05/22/21 2127)    ED Course  I have reviewed the triage vital signs and the nursing notes.  Pertinent labs & imaging results that were available during my care of the patient were reviewed by me and considered in my medical decision making (see chart for details).    MDM Rules/Calculators/A&P                           75yo M with a chief complaints of feeling like his vision is different.  He fell again acutely changed about 4 days ago.  Feels somewhat cloudy.  Nothing seems to make this better or worse.  He saw an ophthalmologist today who was concerned that there is any ulterior pathology and an ophthalmologic 1 and was sent here for evaluation.  We will obtain an MRI of the brain and orbits.  Lab work.  Reassess.  MRI read is negative for acute stroke.  No issue in the orbits.  We will discharge the patient home.  Ophthalmology follow-up.  9:59 PM:  I have discussed the diagnosis/risks/treatment options with the patient and believe the pt to be eligible for discharge home to follow-up with Optho, PCP. We also discussed returning to the  ED immediately if new or worsening sx occur. We discussed the  sx which are most concerning (e.g., sudden worsening pain, fever, inability to tolerate by mouth, stroke s/sx) that necessitate immediate return. Medications administered to the patient during their visit and any new prescriptions provided to the patient are listed below.  Medications given during this visit Medications  gadobutrol (GADAVIST) 1 MMOL/ML injection 10 mL (10 mLs Intravenous Contrast Given 05/22/21 2121)  gadobutrol (GADAVIST) 1 MMOL/ML injection 10 mL (10 mLs Intravenous Contrast Given 05/22/21 2127)     The patient appears reasonably screen and/or stabilized for discharge and I doubt any other medical condition or other [facility] requiring further screening, evaluation, or treatment in the ED at this time prior to discharge.   Final Clinical Impression(s) / ED Diagnoses Final diagnoses:  Visual disturbance    Rx / DC Orders ED Discharge Orders     None        Deno Etienne, DO 05/22/21 2159

## 2021-05-23 ENCOUNTER — Encounter: Payer: Self-pay | Admitting: Neurology

## 2021-05-29 DIAGNOSIS — J343 Hypertrophy of nasal turbinates: Secondary | ICD-10-CM | POA: Diagnosis not present

## 2021-05-29 DIAGNOSIS — J310 Chronic rhinitis: Secondary | ICD-10-CM | POA: Diagnosis not present

## 2021-05-31 DIAGNOSIS — H35032 Hypertensive retinopathy, left eye: Secondary | ICD-10-CM | POA: Diagnosis not present

## 2021-05-31 DIAGNOSIS — H35371 Puckering of macula, right eye: Secondary | ICD-10-CM | POA: Diagnosis not present

## 2021-05-31 DIAGNOSIS — H43811 Vitreous degeneration, right eye: Secondary | ICD-10-CM | POA: Diagnosis not present

## 2021-05-31 DIAGNOSIS — H43391 Other vitreous opacities, right eye: Secondary | ICD-10-CM | POA: Diagnosis not present

## 2021-05-31 DIAGNOSIS — H3581 Retinal edema: Secondary | ICD-10-CM | POA: Diagnosis not present

## 2021-06-04 ENCOUNTER — Ambulatory Visit (HOSPITAL_COMMUNITY): Payer: PPO | Admitting: Physical Therapy

## 2021-06-07 ENCOUNTER — Other Ambulatory Visit: Payer: Self-pay

## 2021-06-07 ENCOUNTER — Encounter (HOSPITAL_COMMUNITY): Payer: Self-pay | Admitting: Physical Therapy

## 2021-06-07 ENCOUNTER — Ambulatory Visit (HOSPITAL_COMMUNITY): Payer: PPO | Attending: Orthopedic Surgery | Admitting: Physical Therapy

## 2021-06-07 DIAGNOSIS — M545 Low back pain, unspecified: Secondary | ICD-10-CM

## 2021-06-07 DIAGNOSIS — M6281 Muscle weakness (generalized): Secondary | ICD-10-CM | POA: Diagnosis not present

## 2021-06-07 DIAGNOSIS — R29898 Other symptoms and signs involving the musculoskeletal system: Secondary | ICD-10-CM | POA: Diagnosis not present

## 2021-06-07 DIAGNOSIS — R2689 Other abnormalities of gait and mobility: Secondary | ICD-10-CM

## 2021-06-07 NOTE — Therapy (Signed)
[name], [place], [address] Phone: [phone]   Fax:  [phone]  Physical Therapy Evaluation  Patient Details  Name: Sean Golden MRN: [id number] Date of Birth: [birth year] Referring Provider (PT): Larena Glassman MD   Encounter Date: 06/07/2021   PT End of Session - 06/07/21 1441     Visit Number 1    Number of Visits 12    Date for PT Re-Evaluation 07/19/21    Authorization Type [employer] (no auth , no VL)    Progress Note Due on Visit 10    PT Start Time [id number]    PT Stop Time [id number]    PT Time Calculation (min) 37 min    Activity Tolerance Patient tolerated treatment well    Behavior During Therapy [facility] for tasks assessed/performed             Past Medical History:  Diagnosis Date   Atrial fibrillation ([place]) SEP 2015   Essential hypertension    GERD (gastroesophageal reflux disease)    Helicobacter pylori gastritis Dec 2015   Treated with Pylera   Sleep apnea     Past Surgical History:  Procedure Laterality Date   CARDIAC ELECTROPHYSIOLOGY STUDY AND ABLATION  2010?   COLONOSCOPY  2005   SOLITARY RECTAL ULCER   COLONOSCOPY N/A 09/06/2014   Dr. Oneida Alar: three colon polyps, moderate diverticulosis throughout the entire examined colon   ESOPHAGOGASTRODUODENOSCOPY N/A 09/06/2014   Dr. Fields:moderate erosive gastritis, no Barrett's. +H.pylori gastritis, treated with Pylera   HEMORRHOID BANDING N/A 09/06/2014   NO BANDING PERFORMED. NO INTERNAL HEMORRHOIDS IDENTIFIED    UPPER GASTROINTESTINAL ENDOSCOPY  2005 RMR   VASECTOMY      There were no vitals filed for this visit.    Subjective Assessment - 06/07/21 1405     Subjective Patient is a 76 y.o. male who presents to physical therapy with c/o chronic LBP. Patient states hip symptoms for years. Patients hips hurt him with walking. He was also having some SOB and decreased endurance. Symptoms ease with rest. Denies radicular symptoms  into BLE. He has been to chiropractor with no change in symptoms. His main goal is to get more energy and be able to walk.    Limitations Standing;Walking;House hold activities    How long can you walk comfortably? 10 minutes    Patient Stated Goals improve energy and walking    Currently in Pain? No/denies                [facility] PT Assessment - 06/07/21 0001       Assessment   Medical Diagnosis Lumbar Pain    Referring Provider (PT) Larena Glassman MD    Onset Date/Surgical Date 06/07/16    Next MD Visit none    Prior Therapy none      Precautions   Precautions None      Restrictions   Weight Bearing Restrictions No      Balance Screen   Has the patient fallen in the past 6 months No    Has the patient had a decrease in activity level because of a fear of falling?  No    Is the patient reluctant to leave their home because of a fear of falling?  No      Prior Function   Level of Independence Independent    Vocation Retired      Associate Professor   Overall Cognitive Status Within Functional Limits for tasks assessed  Observation/Other Assessments   Observations Ambulates without AD    Focus on Therapeutic Outcomes (FOTO)  58% function      Posture/Postural Control   Posture/Postural Control Postural limitations    Postural Limitations Rounded Shoulders;Forward head;Decreased lumbar lordosis      ROM / Strength   AROM / PROM / Strength AROM;Strength      AROM   AROM Assessment Site Lumbar    Lumbar Flexion 0% limited    Lumbar Extension 25% limited    Lumbar - Right Side Bend 25% limited    Lumbar - Left Side Bend 25% limited    Lumbar - Right Rotation 0% limited    Lumbar - Left Rotation 0% limited      Strength   Strength Assessment Site Hip;Knee;Ankle    Right/Left Hip Right;Left    Right Hip Flexion 4+/5    Left Hip Flexion 4+/5    Right/Left Knee Right;Left    Right Knee Flexion 5/5    Right Knee Extension 5/5    Left Knee Flexion 5/5    Left Knee  Extension 5/5    Right/Left Ankle Right;Left    Right Ankle Dorsiflexion 5/5    Left Ankle Dorsiflexion 5/5      Transfers   Comments slightly labored      Ambulation/Gait   Ambulation/Gait Yes    Ambulation/Gait Assistance 7: Independent    Ambulation Distance (Feet) 350 Feet    Assistive device None    Gait Comments 2MWT, "tightness" at 1:35, sob increasing during and following                        Objective measurements completed on examination: See above findings.       [place] Adult PT Treatment/Exercise - 06/07/21 0001       Exercises   Exercises Lumbar      Lumbar Exercises: Stretches   Other Lumbar Stretch Exercise lumbar flexion stretch in seated 10 x 5 second hold                     PT Education - 06/07/21 1405     Education Details Patient educated on exam findings, POC, scope of PT, HEP, beginning a walking program    Person(s) Educated Patient    Methods Explanation;Handout;Demonstration    Comprehension Verbalized understanding;Returned demonstration              PT Short Term Goals - 06/07/21 1446       PT SHORT TERM GOAL #1   Title Patient will be independent with HEP in order to improve functional outcomes.    Time 3    Period Weeks    Status New    Target Date 06/28/21      PT SHORT TERM GOAL #2   Title Patient will report at least 25% improvement in symptoms for improved quality of life.    Time 3    Period Weeks    Status New    Target Date 06/28/21               PT Long Term Goals - 06/07/21 1446       PT LONG TERM GOAL #1   Title Patient will report at least 75% improvement in symptoms for improved quality of life.    Time 6    Period Weeks    Status New    Target Date 07/19/21      PT LONG TERM  GOAL #2   Title Patient will improve FOTO score by at least 10 points in order to indicate improved tolerance to activity.    Time 6    Period Weeks    Status New    Target Date 07/19/21       PT LONG TERM GOAL #3   Title Patient will be able to ambulate at least 425 feet in 2MWT in order to demonstrate improved gait speed for community ambulation.    Time 6    Period Weeks    Status New    Target Date 07/19/21                    Plan - 06/07/21 1442     Clinical Impression Statement Patient is a 75 y.o. male who presents to physical therapy with c/o LBP. Patient with symptoms consistent with spinal stenosis presentation. He presents with pain limited deficits in lumbar strength, ROM, endurance, postural impairments, gait, and functional mobility with ADL. He is having to modify and restrict ADL as indicated by FOTO score as well as subjective information and objective measures which is affecting overall participation. Patient will benefit from skilled physical therapy in order to improve function and reduce impairment.    Personal Factors and Comorbidities Fitness;Comorbidity 3+;Time since onset of injury/illness/exacerbation    Comorbidities chronic Back pain, BMI over 30, Congestive Heart Failure, High  Blood Pressure    Examination-Activity Limitations Locomotion Level;Lift    Examination-Participation Restrictions Cleaning;Yard Work;Volunteer;Shop    Stability/Clinical Decision Making Stable/Uncomplicated    Clinical Decision Making Low    Rehab Potential Good    PT Frequency 2x / week    PT Duration 6 weeks    PT Treatment/Interventions ADLs/Self Care Home Management;Aquatic Therapy;Electrical Stimulation;Iontophoresis '4mg'$ /ml Dexamethasone;Cryotherapy;Moist Heat;Traction;Ultrasound;Parrafin;DME Instruction;Fluidtherapy;Gait training;Stair training;Functional mobility training;Therapeutic activities;Therapeutic exercise;Balance training;Patient/family education;Neuromuscular re-education;Orthotic Fit/Training;Manual techniques;Compression bandaging;Scar mobilization;Passive range of motion;Dry needling;Energy conservation;Splinting;Taping;Spinal Manipulations;Joint  Manipulations    PT Next Visit Plan continue lumbar flexion and mobility exericses, core strength, test glutes, glute strength if needed, improve endurance with gait/activity tolerance    PT Home Exercise Plan 9/15 lumbar flexion stretch    Consulted and Agree with Plan of Care Patient             Patient will benefit from skilled therapeutic intervention in order to improve the following deficits and impairments:  Abnormal gait, Difficulty walking, Decreased range of motion, Decreased endurance, Decreased activity tolerance, Decreased mobility, Decreased strength, Pain  Visit Diagnosis: Low back pain, unspecified back pain laterality, unspecified chronicity, unspecified whether sciatica present  Muscle weakness (generalized)  Other abnormalities of gait and mobility  Other symptoms and signs involving the musculoskeletal system     Problem List Patient Active Problem List   Diagnosis Date Noted   COVID-19 04/30/2021   DOE (dyspnea on exertion) 10/27/2019   Elevated glucose 123456   Helicobacter pylori gastritis 12/06/2014   History of colonic polyps 12/06/2014   Rectal bleeding 08/24/2014   GERD (gastroesophageal reflux disease) 08/24/2014   Atrial fibrillation ([place]) 05/27/2014   Aortic root enlargement ([place]) 05/27/2014   Sleep apnea 05/27/2014   Atrial flutter ([place]) 05/18/2009   FATIGUE / MALAISE 05/18/2009   Essential hypertension 05/09/2009    2:48 PM, 06/07/21 Mearl Latin PT, DPT Physical Therapist at [place], [place], [address] Phone: [phone]   Fax:  [phone]  Name: BELFORD CASCANTE MRN: [id number] Date  of Birth: 1946-01-24

## 2021-06-07 NOTE — Patient Instructions (Signed)
Access Code: [email or web address] URL: [email or web address] Date: 06/07/2021 Prepared by: Mitzi Hansen   Exercises Seated Lumbar Flexion Stretch - 3 x daily - 7 x weekly - 10 reps - 5 second hold

## 2021-06-11 DIAGNOSIS — H40051 Ocular hypertension, right eye: Secondary | ICD-10-CM | POA: Diagnosis not present

## 2021-06-11 DIAGNOSIS — H18231 Secondary corneal edema, right eye: Secondary | ICD-10-CM | POA: Diagnosis not present

## 2021-06-11 DIAGNOSIS — H25813 Combined forms of age-related cataract, bilateral: Secondary | ICD-10-CM | POA: Diagnosis not present

## 2021-06-11 DIAGNOSIS — H3562 Retinal hemorrhage, left eye: Secondary | ICD-10-CM | POA: Diagnosis not present

## 2021-06-11 DIAGNOSIS — H35371 Puckering of macula, right eye: Secondary | ICD-10-CM | POA: Diagnosis not present

## 2021-06-18 DIAGNOSIS — H25813 Combined forms of age-related cataract, bilateral: Secondary | ICD-10-CM | POA: Diagnosis not present

## 2021-06-18 DIAGNOSIS — H40051 Ocular hypertension, right eye: Secondary | ICD-10-CM | POA: Diagnosis not present

## 2021-06-18 DIAGNOSIS — H35371 Puckering of macula, right eye: Secondary | ICD-10-CM | POA: Diagnosis not present

## 2021-06-18 DIAGNOSIS — H18231 Secondary corneal edema, right eye: Secondary | ICD-10-CM | POA: Diagnosis not present

## 2021-06-21 ENCOUNTER — Encounter (HOSPITAL_COMMUNITY): Payer: PPO | Admitting: Physical Therapy

## 2021-06-26 ENCOUNTER — Encounter (HOSPITAL_COMMUNITY): Payer: PPO | Admitting: Physical Therapy

## 2021-06-26 DIAGNOSIS — H40051 Ocular hypertension, right eye: Secondary | ICD-10-CM | POA: Diagnosis not present

## 2021-06-26 DIAGNOSIS — H25813 Combined forms of age-related cataract, bilateral: Secondary | ICD-10-CM | POA: Diagnosis not present

## 2021-06-26 DIAGNOSIS — H35371 Puckering of macula, right eye: Secondary | ICD-10-CM | POA: Diagnosis not present

## 2021-06-26 DIAGNOSIS — H18231 Secondary corneal edema, right eye: Secondary | ICD-10-CM | POA: Diagnosis not present

## 2021-06-28 ENCOUNTER — Encounter (HOSPITAL_COMMUNITY): Payer: PPO | Admitting: Physical Therapy

## 2021-07-03 ENCOUNTER — Encounter (HOSPITAL_COMMUNITY): Payer: PPO | Admitting: Physical Therapy

## 2021-07-05 ENCOUNTER — Encounter (HOSPITAL_COMMUNITY): Payer: PPO | Admitting: Physical Therapy

## 2021-07-09 ENCOUNTER — Encounter (HOSPITAL_COMMUNITY): Payer: PPO | Admitting: Physical Therapy

## 2021-07-11 ENCOUNTER — Encounter (HOSPITAL_COMMUNITY): Payer: PPO | Admitting: Physical Therapy

## 2021-07-16 ENCOUNTER — Encounter (HOSPITAL_COMMUNITY): Payer: PPO | Admitting: Physical Therapy

## 2021-07-18 ENCOUNTER — Encounter (HOSPITAL_COMMUNITY): Payer: PPO | Admitting: Physical Therapy

## 2021-07-23 ENCOUNTER — Encounter (HOSPITAL_COMMUNITY): Payer: PPO | Admitting: Physical Therapy

## 2021-07-23 DIAGNOSIS — R7301 Impaired fasting glucose: Secondary | ICD-10-CM | POA: Insufficient documentation

## 2021-07-23 DIAGNOSIS — E782 Mixed hyperlipidemia: Secondary | ICD-10-CM | POA: Diagnosis present

## 2021-07-23 NOTE — Progress Notes (Deleted)
NEUROLOGY CONSULTATION NOTE  Sean Golden MRN: [id number] DOB: 1946-03-11  Referring provider: Deno Etienne, DO Primary care provider: Allyn Kenner, MD  Reason for consult:  visual disturbance  Assessment/Plan:   ***   Subjective:  Sean Golden is a 75 year old male with a fib, HTN and sleep apnea who presents for visual disturbance.  History supplemented by ED note.  ***.  On 05/22/2021, he saw his ophthalmologist who did not think it was an eye problem and sent him to the ED.  No headache, slurred speech, dizziness, unilateral numbness or weakness.  MRI of brain and orbits with and without contrast personally reviewed showed mild chronic small vessel ischemic changes but no acute abnormalities.         PAST MEDICAL HISTORY: Past Medical History:  Diagnosis Date   Atrial fibrillation ([place]) SEP 2015   Essential hypertension    GERD (gastroesophageal reflux disease)    Helicobacter pylori gastritis Dec 2015   Treated with Pylera   Sleep apnea     PAST SURGICAL HISTORY: Past Surgical History:  Procedure Laterality Date   CARDIAC ELECTROPHYSIOLOGY STUDY AND ABLATION  2010?   COLONOSCOPY  2005   SOLITARY RECTAL ULCER   COLONOSCOPY N/A 09/06/2014   Dr. Oneida Alar: three colon polyps, moderate diverticulosis throughout the entire examined colon   ESOPHAGOGASTRODUODENOSCOPY N/A 09/06/2014   Dr. Fields:moderate erosive gastritis, no Barrett's. +H.pylori gastritis, treated with Pylera   HEMORRHOID BANDING N/A 09/06/2014   NO BANDING PERFORMED. NO INTERNAL HEMORRHOIDS IDENTIFIED    UPPER GASTROINTESTINAL ENDOSCOPY  2005 RMR   VASECTOMY      MEDICATIONS: Current Outpatient Medications on File Prior to Visit  Medication Sig Dispense Refill   apixaban (ELIQUIS) 5 MG TABS tablet Take 1 tablet (5 mg total) by mouth 2 (two) times daily. 60 tablet 11   diltiazem (CARDIZEM CD) 120 MG 24 hr capsule TAKE TWO CAPSULES BY MOUTH IN THE MORNING AND ONE CAPSULE IN THE EVENING 270 capsule 3    losartan (COZAAR) 50 MG tablet TAKE ONE (1) TABLET BY MOUTH EVERY DAY 90 tablet 3   Omega-3 Fatty Acids (FISH OIL) 1000 MG CAPS Take 1 capsule by mouth daily.     pantoprazole (PROTONIX) 40 MG tablet Take 1 tablet (40 mg total) by mouth daily. (Patient taking differently: Take 40 mg by mouth at bedtime.) 30 tablet 11   prednisoLONE acetate (PRED FORTE) 1 % ophthalmic suspension Place 1 drop into both eyes in the morning and at bedtime.     tamsulosin (FLOMAX) 0.4 MG CAPS capsule Take 0.4 mg by mouth every evening.     traMADol (ULTRAM) 50 MG tablet Take 1 tablet (50 mg total) by mouth every 12 (twelve) hours as needed. (Patient taking differently: Take 50 mg by mouth every 12 (twelve) hours as needed for moderate pain or severe pain.) 28 tablet 0   No current facility-administered medications on file prior to visit.    ALLERGIES: No Known Allergies  FAMILY HISTORY: Family History  Problem Relation Age of Onset   Hypertension Mother    Heart attack Father    Hyperlipidemia Sister    Hyperlipidemia Brother    Colon polyps Brother    Hypertension Brother    Hypertension Sister    Diabetes Brother    Stroke Other    Diabetes Other    Hypertension Other    Hyperlipidemia Other    Colon cancer Neg Hx     Objective:  *** General: No acute  distress.  Patient appears well-groomed.   Head:  Normocephalic/atraumatic Eyes:  fundi examined but not visualized Neck: supple, no paraspinal tenderness, full range of motion Back: No paraspinal tenderness Heart: regular rate and rhythm Lungs: Clear to auscultation bilaterally. Vascular: No carotid bruits. Neurological Exam: Mental status: alert and oriented to person, place, and time, recent and remote memory intact, fund of knowledge intact, attention and concentration intact, speech fluent and not dysarthric, language intact. Cranial nerves: CN I: not tested CN II: pupils equal, round and reactive to light, visual fields intact CN III,  IV, VI:  full range of motion, no nystagmus, no ptosis CN V: facial sensation intact. CN VII: upper and lower face symmetric CN VIII: hearing intact CN IX, X: gag intact, uvula midline CN XI: sternocleidomastoid and trapezius muscles intact CN XII: tongue midline Bulk & Tone: normal, no fasciculations. Motor:  muscle strength 5/5 throughout Sensation:  Pinprick, temperature and vibratory sensation intact. Deep Tendon Reflexes:  2+ throughout,  toes downgoing.   Finger to nose testing:  Without dysmetria.   Heel to shin:  Without dysmetria.   Gait:  Normal station and stride.  Romberg negative.    Thank you for allowing me to take part in the care of this patient.  Metta Clines, DO  CC: ***

## 2021-07-24 DIAGNOSIS — H18231 Secondary corneal edema, right eye: Secondary | ICD-10-CM | POA: Diagnosis not present

## 2021-07-24 DIAGNOSIS — H35032 Hypertensive retinopathy, left eye: Secondary | ICD-10-CM | POA: Diagnosis not present

## 2021-07-24 DIAGNOSIS — Z23 Encounter for immunization: Secondary | ICD-10-CM | POA: Diagnosis not present

## 2021-07-24 DIAGNOSIS — H35342 Macular cyst, hole, or pseudohole, left eye: Secondary | ICD-10-CM | POA: Diagnosis not present

## 2021-07-24 DIAGNOSIS — H25813 Combined forms of age-related cataract, bilateral: Secondary | ICD-10-CM | POA: Diagnosis not present

## 2021-07-25 ENCOUNTER — Ambulatory Visit: Payer: PPO | Admitting: Neurology

## 2021-07-26 ENCOUNTER — Encounter (HOSPITAL_COMMUNITY): Payer: PPO | Admitting: Physical Therapy

## 2021-08-02 DIAGNOSIS — H18891 Other specified disorders of cornea, right eye: Secondary | ICD-10-CM | POA: Diagnosis not present

## 2021-08-02 DIAGNOSIS — H35342 Macular cyst, hole, or pseudohole, left eye: Secondary | ICD-10-CM | POA: Diagnosis not present

## 2021-08-02 DIAGNOSIS — H18231 Secondary corneal edema, right eye: Secondary | ICD-10-CM | POA: Diagnosis not present

## 2021-08-02 DIAGNOSIS — H35371 Puckering of macula, right eye: Secondary | ICD-10-CM | POA: Diagnosis not present

## 2021-08-20 ENCOUNTER — Other Ambulatory Visit: Payer: Self-pay | Admitting: Student

## 2021-08-20 NOTE — Telephone Encounter (Signed)
,  Prescription refill request for Eliquis received. Indication: Atrial fib Last office visit: 04/20/21  Myles Gip MD Scr: 1.09 on 05/22/21 Age: 75 Weight: 115.8kg  Based on above findings Eliquis 5mg  twice daily is the appropriate dose.  Refills approved.

## 2021-08-29 DIAGNOSIS — H1820 Unspecified corneal edema: Secondary | ICD-10-CM | POA: Diagnosis not present

## 2021-08-29 DIAGNOSIS — H40051 Ocular hypertension, right eye: Secondary | ICD-10-CM | POA: Diagnosis not present

## 2021-09-13 DIAGNOSIS — H40051 Ocular hypertension, right eye: Secondary | ICD-10-CM | POA: Diagnosis not present

## 2021-09-13 DIAGNOSIS — H4051X0 Glaucoma secondary to other eye disorders, right eye, stage unspecified: Secondary | ICD-10-CM | POA: Diagnosis not present

## 2021-09-13 DIAGNOSIS — H21261 Iris atrophy (essential) (progressive), right eye: Secondary | ICD-10-CM | POA: Diagnosis not present

## 2021-09-16 DIAGNOSIS — H1820 Unspecified corneal edema: Secondary | ICD-10-CM | POA: Insufficient documentation

## 2021-09-16 DIAGNOSIS — H21261 Iris atrophy (essential) (progressive), right eye: Secondary | ICD-10-CM | POA: Insufficient documentation

## 2021-09-16 DIAGNOSIS — H4051X0 Glaucoma secondary to other eye disorders, right eye, stage unspecified: Secondary | ICD-10-CM | POA: Insufficient documentation

## 2021-10-03 DIAGNOSIS — H21269 Iris atrophy (essential) (progressive), unspecified eye: Secondary | ICD-10-CM | POA: Diagnosis not present

## 2021-10-03 DIAGNOSIS — H1820 Unspecified corneal edema: Secondary | ICD-10-CM | POA: Diagnosis not present

## 2021-10-03 DIAGNOSIS — H4051X0 Glaucoma secondary to other eye disorders, right eye, stage unspecified: Secondary | ICD-10-CM | POA: Diagnosis not present

## 2021-10-03 DIAGNOSIS — H21261 Iris atrophy (essential) (progressive), right eye: Secondary | ICD-10-CM | POA: Diagnosis not present

## 2021-10-03 DIAGNOSIS — H4050X0 Glaucoma secondary to other eye disorders, unspecified eye, stage unspecified: Secondary | ICD-10-CM | POA: Diagnosis not present

## 2021-10-03 DIAGNOSIS — H40051 Ocular hypertension, right eye: Secondary | ICD-10-CM | POA: Diagnosis not present

## 2021-10-18 DIAGNOSIS — H1820 Unspecified corneal edema: Secondary | ICD-10-CM | POA: Diagnosis not present

## 2021-10-18 DIAGNOSIS — H21261 Iris atrophy (essential) (progressive), right eye: Secondary | ICD-10-CM | POA: Diagnosis not present

## 2021-10-18 DIAGNOSIS — H4051X0 Glaucoma secondary to other eye disorders, right eye, stage unspecified: Secondary | ICD-10-CM | POA: Diagnosis not present

## 2021-10-27 ENCOUNTER — Emergency Department (HOSPITAL_COMMUNITY): Payer: PPO

## 2021-10-27 ENCOUNTER — Inpatient Hospital Stay (HOSPITAL_COMMUNITY)
Admission: EM | Admit: 2021-10-27 | Discharge: 2021-11-12 | DRG: 438 | Disposition: A | Discharge status: Home Health | Payer: PPO | Attending: Family Medicine | Admitting: Family Medicine

## 2021-10-27 ENCOUNTER — Other Ambulatory Visit: Payer: Self-pay

## 2021-10-27 ENCOUNTER — Encounter (HOSPITAL_COMMUNITY): Payer: Self-pay

## 2021-10-27 DIAGNOSIS — K559 Vascular disorder of intestine, unspecified: Secondary | ICD-10-CM | POA: Diagnosis present

## 2021-10-27 DIAGNOSIS — E669 Obesity, unspecified: Secondary | ICD-10-CM | POA: Diagnosis not present

## 2021-10-27 DIAGNOSIS — D751 Secondary polycythemia: Secondary | ICD-10-CM

## 2021-10-27 DIAGNOSIS — E876 Hypokalemia: Secondary | ICD-10-CM | POA: Diagnosis present

## 2021-10-27 DIAGNOSIS — Z8711 Personal history of peptic ulcer disease: Secondary | ICD-10-CM

## 2021-10-27 DIAGNOSIS — Z6841 Body Mass Index (BMI) 40.0 and over, adult: Secondary | ICD-10-CM

## 2021-10-27 DIAGNOSIS — E8809 Other disorders of plasma-protein metabolism, not elsewhere classified: Secondary | ICD-10-CM | POA: Diagnosis not present

## 2021-10-27 DIAGNOSIS — D72825 Bandemia: Secondary | ICD-10-CM | POA: Diagnosis present

## 2021-10-27 DIAGNOSIS — R0902 Hypoxemia: Secondary | ICD-10-CM

## 2021-10-27 DIAGNOSIS — E875 Hyperkalemia: Secondary | ICD-10-CM | POA: Diagnosis not present

## 2021-10-27 DIAGNOSIS — K311 Adult hypertrophic pyloric stenosis: Secondary | ICD-10-CM | POA: Diagnosis present

## 2021-10-27 DIAGNOSIS — R140 Abdominal distension (gaseous): Secondary | ICD-10-CM | POA: Diagnosis not present

## 2021-10-27 DIAGNOSIS — K659 Peritonitis, unspecified: Secondary | ICD-10-CM | POA: Diagnosis not present

## 2021-10-27 DIAGNOSIS — K760 Fatty (change of) liver, not elsewhere classified: Secondary | ICD-10-CM | POA: Diagnosis present

## 2021-10-27 DIAGNOSIS — E872 Acidosis, unspecified: Secondary | ICD-10-CM | POA: Diagnosis present

## 2021-10-27 DIAGNOSIS — Z83438 Family history of other disorder of lipoprotein metabolism and other lipidemia: Secondary | ICD-10-CM

## 2021-10-27 DIAGNOSIS — I480 Paroxysmal atrial fibrillation: Secondary | ICD-10-CM | POA: Diagnosis present

## 2021-10-27 DIAGNOSIS — G4730 Sleep apnea, unspecified: Secondary | ICD-10-CM | POA: Insufficient documentation

## 2021-10-27 DIAGNOSIS — K920 Hematemesis: Secondary | ICD-10-CM | POA: Diagnosis not present

## 2021-10-27 DIAGNOSIS — K861 Other chronic pancreatitis: Secondary | ICD-10-CM | POA: Diagnosis not present

## 2021-10-27 DIAGNOSIS — K8511 Biliary acute pancreatitis with uninfected necrosis: Secondary | ICD-10-CM | POA: Diagnosis not present

## 2021-10-27 DIAGNOSIS — R109 Unspecified abdominal pain: Secondary | ICD-10-CM | POA: Diagnosis not present

## 2021-10-27 DIAGNOSIS — K219 Gastro-esophageal reflux disease without esophagitis: Secondary | ICD-10-CM | POA: Diagnosis present

## 2021-10-27 DIAGNOSIS — Z7901 Long term (current) use of anticoagulants: Secondary | ICD-10-CM

## 2021-10-27 DIAGNOSIS — R7401 Elevation of levels of liver transaminase levels: Secondary | ICD-10-CM | POA: Diagnosis present

## 2021-10-27 DIAGNOSIS — Z8249 Family history of ischemic heart disease and other diseases of the circulatory system: Secondary | ICD-10-CM

## 2021-10-27 DIAGNOSIS — Z79899 Other long term (current) drug therapy: Secondary | ICD-10-CM

## 2021-10-27 DIAGNOSIS — R188 Other ascites: Secondary | ICD-10-CM | POA: Diagnosis not present

## 2021-10-27 DIAGNOSIS — R0602 Shortness of breath: Secondary | ICD-10-CM

## 2021-10-27 DIAGNOSIS — R1013 Epigastric pain: Secondary | ICD-10-CM | POA: Diagnosis not present

## 2021-10-27 DIAGNOSIS — I4891 Unspecified atrial fibrillation: Secondary | ICD-10-CM | POA: Diagnosis present

## 2021-10-27 DIAGNOSIS — Z66 Do not resuscitate: Secondary | ICD-10-CM | POA: Diagnosis present

## 2021-10-27 DIAGNOSIS — K851 Biliary acute pancreatitis without necrosis or infection: Secondary | ICD-10-CM | POA: Diagnosis not present

## 2021-10-27 DIAGNOSIS — R066 Hiccough: Secondary | ICD-10-CM | POA: Diagnosis not present

## 2021-10-27 DIAGNOSIS — N179 Acute kidney failure, unspecified: Secondary | ICD-10-CM | POA: Diagnosis not present

## 2021-10-27 DIAGNOSIS — R0609 Other forms of dyspnea: Secondary | ICD-10-CM | POA: Diagnosis not present

## 2021-10-27 DIAGNOSIS — R079 Chest pain, unspecified: Secondary | ICD-10-CM | POA: Diagnosis not present

## 2021-10-27 DIAGNOSIS — E871 Hypo-osmolality and hyponatremia: Secondary | ICD-10-CM | POA: Diagnosis not present

## 2021-10-27 DIAGNOSIS — G4733 Obstructive sleep apnea (adult) (pediatric): Secondary | ICD-10-CM | POA: Diagnosis present

## 2021-10-27 DIAGNOSIS — K8689 Other specified diseases of pancreas: Secondary | ICD-10-CM | POA: Diagnosis not present

## 2021-10-27 DIAGNOSIS — J9811 Atelectasis: Secondary | ICD-10-CM | POA: Diagnosis not present

## 2021-10-27 DIAGNOSIS — I10 Essential (primary) hypertension: Secondary | ICD-10-CM | POA: Diagnosis present

## 2021-10-27 DIAGNOSIS — K8591 Acute pancreatitis with uninfected necrosis, unspecified: Secondary | ICD-10-CM | POA: Diagnosis present

## 2021-10-27 DIAGNOSIS — R0603 Acute respiratory distress: Secondary | ICD-10-CM | POA: Diagnosis not present

## 2021-10-27 DIAGNOSIS — J90 Pleural effusion, not elsewhere classified: Secondary | ICD-10-CM | POA: Diagnosis not present

## 2021-10-27 DIAGNOSIS — E8770 Fluid overload, unspecified: Secondary | ICD-10-CM | POA: Diagnosis not present

## 2021-10-27 DIAGNOSIS — K3189 Other diseases of stomach and duodenum: Secondary | ICD-10-CM | POA: Diagnosis not present

## 2021-10-27 DIAGNOSIS — K8522 Alcohol induced acute pancreatitis with infected necrosis: Secondary | ICD-10-CM | POA: Diagnosis not present

## 2021-10-27 DIAGNOSIS — K5730 Diverticulosis of large intestine without perforation or abscess without bleeding: Secondary | ICD-10-CM | POA: Diagnosis not present

## 2021-10-27 DIAGNOSIS — R0600 Dyspnea, unspecified: Secondary | ICD-10-CM | POA: Diagnosis not present

## 2021-10-27 DIAGNOSIS — E860 Dehydration: Secondary | ICD-10-CM | POA: Diagnosis present

## 2021-10-27 DIAGNOSIS — I700 Atherosclerosis of aorta: Secondary | ICD-10-CM | POA: Diagnosis not present

## 2021-10-27 DIAGNOSIS — Z20822 Contact with and (suspected) exposure to covid-19: Secondary | ICD-10-CM | POA: Diagnosis present

## 2021-10-27 DIAGNOSIS — E782 Mixed hyperlipidemia: Secondary | ICD-10-CM | POA: Diagnosis present

## 2021-10-27 DIAGNOSIS — D72829 Elevated white blood cell count, unspecified: Secondary | ICD-10-CM | POA: Diagnosis present

## 2021-10-27 DIAGNOSIS — K859 Acute pancreatitis without necrosis or infection, unspecified: Secondary | ICD-10-CM

## 2021-10-27 DIAGNOSIS — J9601 Acute respiratory failure with hypoxia: Secondary | ICD-10-CM | POA: Diagnosis not present

## 2021-10-27 DIAGNOSIS — H409 Unspecified glaucoma: Secondary | ICD-10-CM | POA: Diagnosis present

## 2021-10-27 DIAGNOSIS — Z87891 Personal history of nicotine dependence: Secondary | ICD-10-CM

## 2021-10-27 DIAGNOSIS — K852 Alcohol induced acute pancreatitis without necrosis or infection: Secondary | ICD-10-CM | POA: Diagnosis not present

## 2021-10-27 DIAGNOSIS — Z9852 Vasectomy status: Secondary | ICD-10-CM

## 2021-10-27 DIAGNOSIS — R54 Age-related physical debility: Secondary | ICD-10-CM | POA: Diagnosis present

## 2021-10-27 LAB — CBC
HCT: 49.7 % (ref 39.0–52.0)
Hemoglobin: 17 g/dL (ref 13.0–17.0)
MCH: 32.5 pg (ref 26.0–34.0)
MCHC: 34.2 g/dL (ref 30.0–36.0)
MCV: 95 fL (ref 80.0–100.0)
Platelets: 324 10*3/uL (ref 150–400)
RBC: 5.23 MIL/uL (ref 4.22–5.81)
RDW: 12.2 % (ref 11.5–15.5)
WBC: 15.7 10*3/uL — ABNORMAL HIGH (ref 4.0–10.5)
nRBC: 0 % (ref 0.0–0.2)

## 2021-10-27 MED ORDER — HYDROMORPHONE HCL 1 MG/ML IJ SOLN
1.0000 mg | Freq: Once | INTRAMUSCULAR | Status: AC
  Administered 2021-10-27: 1 mg via INTRAVENOUS
  Filled 2021-10-27: qty 1

## 2021-10-27 MED ORDER — IOHEXOL 350 MG/ML SOLN
100.0000 mL | Freq: Once | INTRAVENOUS | Status: AC | PRN
  Administered 2021-10-28: 100 mL via INTRAVENOUS

## 2021-10-27 MED ORDER — ALUM & MAG HYDROXIDE-SIMETH 200-200-20 MG/5ML PO SUSP
30.0000 mL | Freq: Once | ORAL | Status: AC
  Administered 2021-10-27: 30 mL via ORAL
  Filled 2021-10-27: qty 30

## 2021-10-27 MED ORDER — FENTANYL CITRATE PF 50 MCG/ML IJ SOSY
50.0000 ug | PREFILLED_SYRINGE | Freq: Once | INTRAMUSCULAR | Status: AC
  Administered 2021-10-27: 50 ug via INTRAVENOUS
  Filled 2021-10-27: qty 1

## 2021-10-27 MED ORDER — ONDANSETRON HCL 4 MG/2ML IJ SOLN
4.0000 mg | Freq: Once | INTRAMUSCULAR | Status: AC
  Administered 2021-10-27: 4 mg via INTRAVENOUS
  Filled 2021-10-27: qty 2

## 2021-10-27 MED ORDER — FAMOTIDINE IN NACL 20-0.9 MG/50ML-% IV SOLN
20.0000 mg | Freq: Once | INTRAVENOUS | Status: AC
  Administered 2021-10-27: 20 mg via INTRAVENOUS
  Filled 2021-10-27: qty 50

## 2021-10-27 NOTE — ED Triage Notes (Signed)
Pt from home, presents with epigastric pain that started 20 minutes after eating a hot dog and veggie tots, pt has not taken anything for heartburn, describes pain as sharp, reports sob as well.

## 2021-10-27 NOTE — ED Provider Notes (Signed)
[facility] Emergency Department Provider Note MRN:  [id number]  Arrival date & time: 10/28/21     Chief Complaint   Abdominal Pain   History of Present Illness   Sean Golden is a 76 y.o. year-old male with a history of A. fib, hypertension, GERD presenting to the ED with chief complaint of abdominal pain.  Severe epigastric abdominal pain described as pressure starting shortly after dinner this evening.  Not going away after 4 hours.  Not passing gas during this time which is abnormal for him.  Denies chest pain or shortness of breath, no recent fever.  Review of Systems  A thorough review of systems was obtained and all systems are negative except as noted in the HPI and PMH.   Patient's Health History    Past Medical History:  Diagnosis Date   Atrial fibrillation ([place]) SEP 2015   Essential hypertension    GERD (gastroesophageal reflux disease)    Helicobacter pylori gastritis Dec 2015   Treated with Pylera   Sleep apnea     Past Surgical History:  Procedure Laterality Date   CARDIAC ELECTROPHYSIOLOGY STUDY AND ABLATION  2010?   COLONOSCOPY  2005   SOLITARY RECTAL ULCER   COLONOSCOPY N/A 09/06/2014   Dr. Oneida Alar: three colon polyps, moderate diverticulosis throughout the entire examined colon   ESOPHAGOGASTRODUODENOSCOPY N/A 09/06/2014   Dr. Fields:moderate erosive gastritis, no Barrett's. +H.pylori gastritis, treated with Pylera   HEMORRHOID BANDING N/A 09/06/2014   NO BANDING PERFORMED. NO INTERNAL HEMORRHOIDS IDENTIFIED    UPPER GASTROINTESTINAL ENDOSCOPY  2005 RMR   VASECTOMY      Family History  Problem Relation Age of Onset   Hypertension Mother    Heart attack Father    Hyperlipidemia Sister    Hyperlipidemia Brother    Colon polyps Brother    Hypertension Brother    Hypertension Sister    Diabetes Brother    Stroke Other    Diabetes Other    Hypertension Other    Hyperlipidemia Other    Colon cancer Neg Hx     Social  History   Socioeconomic History   Marital status: Married    Spouse name: Not on file   Number of children: Not on file   Years of education: Not on file   Highest education level: Not on file  Occupational History   Occupation: Full time  Tobacco Use   Smoking status: Former    Packs/day: 0.50    Years: 15.00    Pack years: 7.50    Types: Cigarettes    Start date: 05/28/1967    Quit date: 05/27/1984    Years since quitting: 37.4   Smokeless tobacco: Never  Vaping Use   Vaping Use: Never used  Substance and Sexual Activity   Alcohol use: Yes    Alcohol/week: 14.0 standard drinks    Types: 14 Glasses of wine per week    Comment: 1 or 2 glasses of wine at night   Drug use: No   Sexual activity: Not on file  Other Topics Concern   Not on file  Social History Narrative   Married-2 KIDS(AGE 31 AND 38).   No regular exercise   Social Determinants of Health   Financial Resource Strain: Not on file  Food Insecurity: Not on file  Transportation Needs: Not on file  Physical Activity: Not on file  Stress: Not on file  Social Connections: Not on file  Intimate Partner Violence: Not on file  Physical Exam   Vitals:   10/28/21 0320 10/28/21 0716  BP: 128/82 132/74  Pulse: 78 60  Resp: (!) 22   Temp: 98.2 F (36.8 C) 97.6 F (36.4 C)  SpO2: 93% 93%    CONSTITUTIONAL: Well-appearing, in moderate distress due to pain NEURO/PSYCH:  Alert and oriented x 3, no focal deficits EYES:  eyes equal and reactive ENT/NECK:  no LAD, no JVD CARDIO: Regular rate, well-perfused, normal S1 and S2 PULM:  CTAB no wheezing or rhonchi GI/GU: Moderately distended, moderate epigastric tenderness to palpation MSK/SPINE:  No gross deformities, no edema SKIN:  no rash, atraumatic   *Additional and/or pertinent findings included in MDM below  Diagnostic and Interventional Summary    EKG Interpretation  Date/Time:  Saturday October 27 2021 22:47:46 EST Ventricular Rate:  99 PR  Interval:    QRS Duration: 72 QT Interval:  358 QTC Calculation: 459 R Axis:   -39 Text Interpretation: Atrial fibrillation Left axis deviation Inferior infarct (cited on or before 22-May-2021) Abnormal ECG When compared with ECG of 22-May-2021 15:09, Previous ECG has undetermined rhythm, needs review Confirmed by Gerlene Fee [phone]) on 10/27/2021 10:58:09 PM       Labs Reviewed  CBC - Abnormal; Notable for the following components:      Result Value   WBC 15.7 (*)    All other components within normal limits  COMPREHENSIVE METABOLIC PANEL - Abnormal; Notable for the following components:   Potassium 3.4 (*)    CO2 20 (*)    Glucose, Bld 161 (*)    AST 171 (*)    ALT 83 (*)    All other components within normal limits  LIPASE, BLOOD - Abnormal; Notable for the following components:   Lipase 1,681 (*)    All other components within normal limits  LIPID PANEL - Abnormal; Notable for the following components:   LDL Cholesterol 141 (*)    All other components within normal limits  CBC WITH DIFFERENTIAL/PLATELET - Abnormal; Notable for the following components:   WBC 12.1 (*)    Hemoglobin 17.6 (*)    HCT 54.0 (*)    Neutro Abs 11.0 (*)    Lymphs Abs 0.3 (*)    All other components within normal limits  RESP PANEL BY RT-PCR (FLU A&B, COVID) [id number]  COMPREHENSIVE METABOLIC PANEL  MAGNESIUM  LIPASE, BLOOD  CK  TROPONIN I (HIGH SENSITIVITY)  TROPONIN I (HIGH SENSITIVITY)    CT Angio Chest/Abd/Pel for Dissection W and/or Wo Contrast  Final Result    DG Chest Port 1 View  Final Result    US Abdomen Limited RUQ (LIVER/GB)    (Results Pending)    Medications  morphine (PF) 4 MG/ML injection 4 mg (4 mg Intravenous Given 10/28/21 0533)  diltiazem (CARDIZEM CD) 24 hr capsule 120 mg (has no administration in time range)  losartan (COZAAR) tablet 50 mg (has no administration in time range)  pantoprazole (PROTONIX) EC tablet 40 mg (has no administration in time range)  tamsulosin  (FLOMAX) capsule 0.4 mg (has no administration in time range)  acetaminophen (TYLENOL) tablet 650 mg (has no administration in time range)    Or  acetaminophen (TYLENOL) suppository 650 mg (has no administration in time range)  0.9 %  sodium chloride infusion ( Intravenous New Bag/Given 10/28/21 0341)  oxyCODONE (Oxy IR/ROXICODONE) immediate release tablet 5 mg (has no administration in time range)  ondansetron (ZOFRAN) tablet 4 mg (has no administration in time range)    Or  ondansetron [facility]) injection 4 mg (has no administration in time range)  heparin injection 5,000 Units (5,000 Units Subcutaneous Given 10/28/21 0554)  ondansetron (ZOFRAN) injection 4 mg (4 mg Intravenous Given 10/27/21 2354)  famotidine (PEPCID) IVPB 20 mg premix (0 mg Intravenous Stopped 10/28/21 0020)  alum & mag hydroxide-simeth (MAALOX/MYLANTA) 200-200-20 MG/5ML suspension 30 mL (30 mLs Oral Given 10/27/21 2355)  fentaNYL (SUBLIMAZE) injection 50 mcg (50 mcg Intravenous Given 10/27/21 2354)  HYDROmorphone (DILAUDID) injection 1 mg (1 mg Intravenous Given 10/27/21 2358)  iohexol (OMNIPAQUE) 350 MG/ML injection 100 mL (100 mLs Intravenous Contrast Given 10/28/21 0056)  potassium chloride 10 mEq in 100 mL IVPB (10 mEq Intravenous New Bag/Given 10/28/21 0529)     Procedures  /  Critical Care Procedures  ED Course and Medical Decision Making  Initial Impression and Ddx Suspect GERD however also considering SBO, mesenteric ischemia.  Will trial meds, obtain basic labs, monitor closely.  Past medical/surgical history that increases complexity of ED encounter: A. fib  Interpretation of Diagnostics I personally reviewed the EKG and my interpretation is as follows: A. fib    Labs reveal elevated lipase, mild LFT elevation.  CTA is without vascular pathology, there is evidence of pancreatitis  Patient Reassessment and Ultimate Disposition/Management Patient more comfortable, admitted to medicine for further care  Patient  management required discussion with the following services or consulting groups:  Hospitalist Service  Complexity of Problems Addressed Acute illness or injury that poses threat of life of bodily function  Additional Data Reviewed and Analyzed Further history obtained from: Further history from spouse/family member  Factors Impacting ED Encounter Risk Use of parenteral controlled substances and Consideration of hospitalization  Barth Kirks. [place] Small, [place] [email or web address]  Final Clinical Impressions(s) / ED Diagnoses     ICD-10-CM   1. Acute pancreatitis  K85.90 US Abdomen Limited RUQ (LIVER/GB)    US Abdomen Limited RUQ (LIVER/GB)      ED Discharge Orders     None        Discharge Instructions Discussed with and Provided to Patient:   Discharge Instructions   None      Maudie Flakes, MD 10/28/21 [phone]

## 2021-10-28 ENCOUNTER — Inpatient Hospital Stay (HOSPITAL_COMMUNITY): Payer: PPO

## 2021-10-28 DIAGNOSIS — G4733 Obstructive sleep apnea (adult) (pediatric): Secondary | ICD-10-CM | POA: Diagnosis present

## 2021-10-28 DIAGNOSIS — K219 Gastro-esophageal reflux disease without esophagitis: Secondary | ICD-10-CM | POA: Diagnosis present

## 2021-10-28 DIAGNOSIS — E8770 Fluid overload, unspecified: Secondary | ICD-10-CM | POA: Diagnosis not present

## 2021-10-28 DIAGNOSIS — K8591 Acute pancreatitis with uninfected necrosis, unspecified: Secondary | ICD-10-CM | POA: Diagnosis present

## 2021-10-28 DIAGNOSIS — E876 Hypokalemia: Secondary | ICD-10-CM | POA: Diagnosis not present

## 2021-10-28 DIAGNOSIS — K859 Acute pancreatitis without necrosis or infection, unspecified: Secondary | ICD-10-CM | POA: Diagnosis not present

## 2021-10-28 DIAGNOSIS — J9601 Acute respiratory failure with hypoxia: Secondary | ICD-10-CM | POA: Diagnosis not present

## 2021-10-28 DIAGNOSIS — I10 Essential (primary) hypertension: Secondary | ICD-10-CM | POA: Diagnosis present

## 2021-10-28 DIAGNOSIS — Z66 Do not resuscitate: Secondary | ICD-10-CM | POA: Diagnosis not present

## 2021-10-28 DIAGNOSIS — R1013 Epigastric pain: Secondary | ICD-10-CM | POA: Diagnosis not present

## 2021-10-28 DIAGNOSIS — R0603 Acute respiratory distress: Secondary | ICD-10-CM | POA: Diagnosis not present

## 2021-10-28 DIAGNOSIS — D72825 Bandemia: Secondary | ICD-10-CM | POA: Diagnosis not present

## 2021-10-28 DIAGNOSIS — R7401 Elevation of levels of liver transaminase levels: Secondary | ICD-10-CM | POA: Diagnosis not present

## 2021-10-28 DIAGNOSIS — I480 Paroxysmal atrial fibrillation: Secondary | ICD-10-CM | POA: Diagnosis not present

## 2021-10-28 DIAGNOSIS — K311 Adult hypertrophic pyloric stenosis: Secondary | ICD-10-CM | POA: Diagnosis present

## 2021-10-28 DIAGNOSIS — R0600 Dyspnea, unspecified: Secondary | ICD-10-CM | POA: Diagnosis not present

## 2021-10-28 DIAGNOSIS — K861 Other chronic pancreatitis: Secondary | ICD-10-CM | POA: Diagnosis not present

## 2021-10-28 DIAGNOSIS — Z20822 Contact with and (suspected) exposure to covid-19: Secondary | ICD-10-CM | POA: Diagnosis not present

## 2021-10-28 DIAGNOSIS — R188 Other ascites: Secondary | ICD-10-CM | POA: Diagnosis not present

## 2021-10-28 DIAGNOSIS — E669 Obesity, unspecified: Secondary | ICD-10-CM | POA: Diagnosis not present

## 2021-10-28 DIAGNOSIS — Z6841 Body Mass Index (BMI) 40.0 and over, adult: Secondary | ICD-10-CM | POA: Diagnosis not present

## 2021-10-28 DIAGNOSIS — E782 Mixed hyperlipidemia: Secondary | ICD-10-CM

## 2021-10-28 DIAGNOSIS — K851 Biliary acute pancreatitis without necrosis or infection: Secondary | ICD-10-CM | POA: Diagnosis not present

## 2021-10-28 DIAGNOSIS — K852 Alcohol induced acute pancreatitis without necrosis or infection: Secondary | ICD-10-CM | POA: Diagnosis not present

## 2021-10-28 DIAGNOSIS — G4730 Sleep apnea, unspecified: Secondary | ICD-10-CM

## 2021-10-28 DIAGNOSIS — E871 Hypo-osmolality and hyponatremia: Secondary | ICD-10-CM | POA: Diagnosis not present

## 2021-10-28 DIAGNOSIS — D751 Secondary polycythemia: Secondary | ICD-10-CM | POA: Diagnosis not present

## 2021-10-28 DIAGNOSIS — K760 Fatty (change of) liver, not elsewhere classified: Secondary | ICD-10-CM | POA: Diagnosis present

## 2021-10-28 DIAGNOSIS — E872 Acidosis, unspecified: Secondary | ICD-10-CM

## 2021-10-28 DIAGNOSIS — E875 Hyperkalemia: Secondary | ICD-10-CM | POA: Diagnosis not present

## 2021-10-28 DIAGNOSIS — R079 Chest pain, unspecified: Secondary | ICD-10-CM | POA: Diagnosis not present

## 2021-10-28 DIAGNOSIS — R0902 Hypoxemia: Secondary | ICD-10-CM | POA: Diagnosis not present

## 2021-10-28 DIAGNOSIS — I700 Atherosclerosis of aorta: Secondary | ICD-10-CM | POA: Diagnosis not present

## 2021-10-28 DIAGNOSIS — R0602 Shortness of breath: Secondary | ICD-10-CM | POA: Diagnosis not present

## 2021-10-28 DIAGNOSIS — K8522 Alcohol induced acute pancreatitis with infected necrosis: Secondary | ICD-10-CM | POA: Diagnosis not present

## 2021-10-28 DIAGNOSIS — K8511 Biliary acute pancreatitis with uninfected necrosis: Secondary | ICD-10-CM | POA: Diagnosis not present

## 2021-10-28 DIAGNOSIS — K659 Peritonitis, unspecified: Secondary | ICD-10-CM | POA: Diagnosis not present

## 2021-10-28 DIAGNOSIS — E8809 Other disorders of plasma-protein metabolism, not elsewhere classified: Secondary | ICD-10-CM | POA: Diagnosis not present

## 2021-10-28 DIAGNOSIS — K8689 Other specified diseases of pancreas: Secondary | ICD-10-CM | POA: Diagnosis not present

## 2021-10-28 DIAGNOSIS — J90 Pleural effusion, not elsewhere classified: Secondary | ICD-10-CM | POA: Diagnosis not present

## 2021-10-28 DIAGNOSIS — R140 Abdominal distension (gaseous): Secondary | ICD-10-CM | POA: Diagnosis not present

## 2021-10-28 DIAGNOSIS — R066 Hiccough: Secondary | ICD-10-CM | POA: Diagnosis not present

## 2021-10-28 DIAGNOSIS — J9811 Atelectasis: Secondary | ICD-10-CM | POA: Diagnosis not present

## 2021-10-28 DIAGNOSIS — K559 Vascular disorder of intestine, unspecified: Secondary | ICD-10-CM | POA: Diagnosis not present

## 2021-10-28 DIAGNOSIS — N179 Acute kidney failure, unspecified: Secondary | ICD-10-CM | POA: Diagnosis not present

## 2021-10-28 DIAGNOSIS — R109 Unspecified abdominal pain: Secondary | ICD-10-CM | POA: Diagnosis not present

## 2021-10-28 DIAGNOSIS — R0609 Other forms of dyspnea: Secondary | ICD-10-CM | POA: Diagnosis not present

## 2021-10-28 DIAGNOSIS — E860 Dehydration: Secondary | ICD-10-CM | POA: Diagnosis present

## 2021-10-28 LAB — COMPREHENSIVE METABOLIC PANEL
ALT: 83 U/L — ABNORMAL HIGH (ref 0–44)
ALT: 100 U/L — ABNORMAL HIGH (ref 0–44)
AST: 171 U/L — ABNORMAL HIGH (ref 15–41)
AST: 136 U/L — ABNORMAL HIGH (ref 15–41)
Albumin: 4 g/dL (ref 3.5–5.0)
Albumin: 3.8 g/dL (ref 3.5–5.0)
Alkaline Phosphatase: 96 U/L (ref 38–126)
Alkaline Phosphatase: 89 U/L (ref 38–126)
Anion gap: 12 (ref 5–15)
Anion gap: 10 (ref 5–15)
BUN: 20 mg/dL (ref 8–23)
BUN: 21 mg/dL (ref 8–23)
CO2: 20 mmol/L — ABNORMAL LOW (ref 22–32)
CO2: 20 mmol/L — ABNORMAL LOW (ref 22–32)
Calcium: 8.9 mg/dL (ref 8.9–10.3)
Calcium: 8.7 mg/dL — ABNORMAL LOW (ref 8.9–10.3)
Chloride: 105 mmol/L (ref 98–111)
Chloride: 108 mmol/L (ref 98–111)
Creatinine, Ser: 0.97 mg/dL (ref 0.61–1.24)
Creatinine, Ser: 0.95 mg/dL (ref 0.61–1.24)
GFR, Estimated: >60 mL/min (ref >60)
GFR, Estimated: >60 mL/min (ref >60)
Glucose, Bld: 161 mg/dL — ABNORMAL HIGH (ref 70–99)
Glucose, Bld: 176 mg/dL — ABNORMAL HIGH (ref 70–99)
Potassium: 3.4 mmol/L — ABNORMAL LOW (ref 3.5–5.1)
Potassium: 3.7 mmol/L (ref 3.5–5.1)
Sodium: 137 mmol/L (ref 135–145)
Sodium: 138 mmol/L (ref 135–145)
Total Bilirubin: 1.1 mg/dL (ref 0.3–1.2)
Total Bilirubin: 1.6 mg/dL — ABNORMAL HIGH (ref 0.3–1.2)
Total Protein: 7 g/dL (ref 6.5–8.1)
Total Protein: 6.7 g/dL (ref 6.5–8.1)

## 2021-10-28 LAB — LIPID PANEL
Cholesterol: 199 mg/dL (ref 0–200)
HDL: 41 mg/dL (ref >40)
LDL Cholesterol: 141 mg/dL — ABNORMAL HIGH (ref 0–99)
Total CHOL/HDL Ratio: 4.9 RATIO
Triglycerides: 85 mg/dL (ref <150)
VLDL: 17 mg/dL (ref 0–40)

## 2021-10-28 LAB — CBC WITH DIFFERENTIAL/PLATELET
Abs Immature Granulocytes: 0.05 10*3/uL (ref 0.00–0.07)
Basophils Absolute: 0 10*3/uL (ref 0.0–0.1)
Basophils Relative: 0 %
Eosinophils Absolute: 0 10*3/uL (ref 0.0–0.5)
Eosinophils Relative: 0 %
HCT: 54 % — ABNORMAL HIGH (ref 39.0–52.0)
Hemoglobin: 17.6 g/dL — ABNORMAL HIGH (ref 13.0–17.0)
Immature Granulocytes: 0 %
Lymphocytes Relative: 2 %
Lymphs Abs: 0.3 10*3/uL — ABNORMAL LOW (ref 0.7–4.0)
MCH: 31.2 pg (ref 26.0–34.0)
MCHC: 32.6 g/dL (ref 30.0–36.0)
MCV: 95.7 fL (ref 80.0–100.0)
Monocytes Absolute: 0.7 10*3/uL (ref 0.1–1.0)
Monocytes Relative: 6 %
Neutro Abs: 11 10*3/uL — ABNORMAL HIGH (ref 1.7–7.7)
Neutrophils Relative %: 92 %
Platelets: 339 10*3/uL (ref 150–400)
RBC: 5.64 MIL/uL (ref 4.22–5.81)
RDW: 12.2 % (ref 11.5–15.5)
WBC: 12.1 10*3/uL — ABNORMAL HIGH (ref 4.0–10.5)
nRBC: 0 % (ref 0.0–0.2)

## 2021-10-28 LAB — APTT
aPTT: 30 seconds (ref 24–36)
aPTT: 66 seconds — ABNORMAL HIGH (ref 24–36)

## 2021-10-28 LAB — TROPONIN I (HIGH SENSITIVITY)
Troponin I (High Sensitivity): 4 ng/L (ref <18)
Troponin I (High Sensitivity): 3 ng/L (ref <18)

## 2021-10-28 LAB — HEPARIN LEVEL (UNFRACTIONATED): Heparin Unfractionated: 0.77 IU/mL — ABNORMAL HIGH (ref 0.30–0.70)

## 2021-10-28 LAB — CK: Total CK: 71 U/L (ref 49–397)

## 2021-10-28 LAB — RESP PANEL BY RT-PCR (FLU A&B, COVID) ARPGX2
Influenza A by PCR: NEGATIVE
Influenza B by PCR: NEGATIVE
SARS Coronavirus 2 by RT PCR: NEGATIVE

## 2021-10-28 LAB — LACTIC ACID, PLASMA
Lactic Acid, Venous: 3.6 mmol/L (ref 0.5–1.9)
Lactic Acid, Venous: 3.3 mmol/L (ref 0.5–1.9)

## 2021-10-28 LAB — MAGNESIUM: Magnesium: 1.7 mg/dL (ref 1.7–2.4)

## 2021-10-28 LAB — LIPASE, BLOOD
Lipase: 1681 U/L — ABNORMAL HIGH (ref 11–51)
Lipase: 1678 U/L — ABNORMAL HIGH (ref 11–51)

## 2021-10-28 MED ORDER — OXYCODONE HCL 5 MG PO TABS: 5.0000 mg | ORAL_TABLET | ORAL | Status: DC | PRN

## 2021-10-28 MED ORDER — MAGNESIUM SULFATE 2 GM/50ML IV SOLN
2.0000 g | Freq: Once | INTRAVENOUS | Status: AC
Start: 2021-10-28 — End: 2021-10-28
  Administered 2021-10-28: 2 g via INTRAVENOUS
  Filled 2021-10-28: qty 50

## 2021-10-28 MED ORDER — DILTIAZEM HCL ER COATED BEADS 120 MG PO CP24: 120.0000 mg | ORAL_CAPSULE | Freq: Every day | ORAL | Status: DC

## 2021-10-28 MED ORDER — HYDROMORPHONE HCL 1 MG/ML IJ SOLN
0.5000 mg | INTRAMUSCULAR | Status: DC | PRN
  Administered 2021-10-28 – 2021-11-10 (×29): 0.5 mg via INTRAVENOUS
  Filled 2021-10-28 (×5): qty 0.5
  Filled 2021-10-28: qty 1
  Filled 2021-10-28 (×23): qty 0.5

## 2021-10-28 MED ORDER — ACETAMINOPHEN 325 MG PO TABS
650.0000 mg | ORAL_TABLET | Freq: Four times a day (QID) | ORAL | Status: DC | PRN
  Administered 2021-11-06 – 2021-11-12 (×4): 650 mg via ORAL
  Filled 2021-10-28 (×4): qty 2

## 2021-10-28 MED ORDER — METOPROLOL TARTRATE 5 MG/5ML IV SOLN
2.5000 mg | Freq: Four times a day (QID) | INTRAVENOUS | Status: DC
  Administered 2021-10-28 – 2021-10-29 (×3): 2.5 mg via INTRAVENOUS
  Filled 2021-10-28 (×3): qty 5

## 2021-10-28 MED ORDER — HEPARIN SODIUM (PORCINE) 5000 UNIT/ML IJ SOLN
5000.0000 [IU] | Freq: Three times a day (TID) | INTRAMUSCULAR | Status: DC
  Administered 2021-10-28: 5000 [IU] via SUBCUTANEOUS
  Filled 2021-10-28: qty 1

## 2021-10-28 MED ORDER — BRIMONIDINE TARTRATE 0.2 % OP SOLN
1.0000 [drp] | Freq: Two times a day (BID) | OPHTHALMIC | Status: DC
  Administered 2021-10-28 – 2021-11-12 (×29): 1 [drp] via OPHTHALMIC
  Filled 2021-10-28 (×5): qty 5

## 2021-10-28 MED ORDER — POTASSIUM CHLORIDE 10 MEQ/100ML IV SOLN
10.0000 meq | INTRAVENOUS | Status: AC
  Administered 2021-10-28 (×2): 10 meq via INTRAVENOUS
  Filled 2021-10-28 (×2): qty 100

## 2021-10-28 MED ORDER — ONDANSETRON HCL 4 MG/2ML IJ SOLN: 4.0000 mg | Freq: Four times a day (QID) | INTRAMUSCULAR | Status: DC | PRN

## 2021-10-28 MED ORDER — PANTOPRAZOLE SODIUM 40 MG PO TBEC: 40.0000 mg | DELAYED_RELEASE_TABLET | Freq: Every day | ORAL | Status: DC

## 2021-10-28 MED ORDER — MORPHINE SULFATE (PF) 4 MG/ML IV SOLN
4.0000 mg | INTRAVENOUS | Status: DC | PRN
  Administered 2021-10-28 (×2): 4 mg via INTRAVENOUS
  Filled 2021-10-28 (×2): qty 1

## 2021-10-28 MED ORDER — TAMSULOSIN HCL 0.4 MG PO CAPS
0.4000 mg | ORAL_CAPSULE | Freq: Every evening | ORAL | Status: DC
  Administered 2021-10-28: 0.4 mg via ORAL
  Filled 2021-10-28 (×2): qty 1

## 2021-10-28 MED ORDER — DILTIAZEM HCL ER COATED BEADS 120 MG PO CP24
240.0000 mg | ORAL_CAPSULE | Freq: Every day | ORAL | Status: DC
  Administered 2021-10-28: 240 mg via ORAL
  Filled 2021-10-28: qty 2

## 2021-10-28 MED ORDER — HEPARIN (PORCINE) 25000 UT/250ML-% IV SOLN
2700.0000 [IU]/h | INTRAVENOUS | Status: DC
  Administered 2021-10-28: 12:00:00 1400 [IU]/h via INTRAVENOUS
  Administered 2021-10-29: 1750 [IU]/h via INTRAVENOUS
  Administered 2021-10-30: 1900 [IU]/h via INTRAVENOUS
  Administered 2021-10-31: 2000 [IU]/h via INTRAVENOUS
  Administered 2021-10-31: 2150 [IU]/h via INTRAVENOUS
  Administered 2021-11-01: 2300 [IU]/h via INTRAVENOUS
  Administered 2021-11-01: 14:00:00 2450 [IU]/h via INTRAVENOUS
  Administered 2021-11-02 – 2021-11-04 (×6): 2600 [IU]/h via INTRAVENOUS
  Administered 2021-11-04 – 2021-11-05 (×3): 2700 [IU]/h via INTRAVENOUS
  Filled 2021-10-28 (×15): qty 250

## 2021-10-28 MED ORDER — APIXABAN 5 MG PO TABS: 5.0000 mg | ORAL_TABLET | Freq: Two times a day (BID) | ORAL | Status: DC

## 2021-10-28 MED ORDER — LOSARTAN POTASSIUM 50 MG PO TABS
50.0000 mg | ORAL_TABLET | Freq: Every day | ORAL | Status: DC
  Administered 2021-10-28: 50 mg via ORAL
  Filled 2021-10-28: qty 1

## 2021-10-28 MED ORDER — SODIUM CHLORIDE 0.9 % IV BOLUS
1000.0000 mL | Freq: Once | INTRAVENOUS | Status: AC
  Administered 2021-10-28: 1000 mL via INTRAVENOUS

## 2021-10-28 MED ORDER — ACETAMINOPHEN 650 MG RE SUPP: 650.0000 mg | Freq: Four times a day (QID) | RECTAL | Status: DC | PRN

## 2021-10-28 MED ORDER — SODIUM CHLORIDE 0.9 % IV SOLN: INTRAVENOUS | Status: DC

## 2021-10-28 MED ORDER — ONDANSETRON HCL 4 MG PO TABS: 4.0000 mg | ORAL_TABLET | Freq: Four times a day (QID) | ORAL | Status: DC | PRN

## 2021-10-28 MED ORDER — PANTOPRAZOLE SODIUM 40 MG IV SOLR
40.0000 mg | Freq: Two times a day (BID) | INTRAVENOUS | Status: DC
  Administered 2021-10-28 – 2021-11-10 (×28): 40 mg via INTRAVENOUS
  Filled 2021-10-28 (×2): qty 10
  Filled 2021-10-28: qty 40
  Filled 2021-10-28 (×13): qty 10
  Filled 2021-10-28 (×2): qty 40
  Filled 2021-10-28 (×3): qty 10
  Filled 2021-10-28: qty 40
  Filled 2021-10-28: qty 10
  Filled 2021-10-28: qty 40
  Filled 2021-10-28 (×5): qty 10

## 2021-10-28 NOTE — Assessment & Plan Note (Addendum)
Pattern consistent with alcohol or rhabdo but CK within normal.  Improved.

## 2021-10-28 NOTE — Assessment & Plan Note (Addendum)
Holding statin 

## 2021-10-28 NOTE — Progress Notes (Signed)
Central Tele called to notify nurse that patient's HR has reached 140-150 intermittently while at rest but has came back down quickly. Dr. Orlin Hilding notified through secure chat.

## 2021-10-28 NOTE — Assessment & Plan Note (Addendum)
Fluid within distal esophagus suggesting GERD as he reports heartburn.Cont PPI bid.

## 2021-10-28 NOTE — Assessment & Plan Note (Addendum)
usese CPAP at home. Here cont BiPAP

## 2021-10-28 NOTE — Progress Notes (Deleted)
ANTICOAGULATION CONSULT NOTE - Initial Consult  Pharmacy Consult for Heparin Indication: atrial fibrillation  No Known Allergies  Patient Measurements: Height: 5\' 11"  (180.3 cm) Weight: 117.6 kg (259 lb 3.2 oz) IBW/kg (Calculated) : 75.3 HEPARIN DW (KG): 101.2   Vital Signs: Temp: 97.9 F (36.6 C) (02/05 1015) Temp Source: Oral (02/05 0246) BP: 127/76 (02/05 1015) Pulse Rate: 93 (02/05 1015)  Labs: Recent Labs    10/27/21 2312 10/28/21 0136 10/28/21 0419  HGB 17.0  --  17.6*  HCT 49.7  --  54.0*  PLT 324  --  339  CREATININE 0.97  --  0.95  CKTOTAL  --   --  71  TROPONINIHS 4 3  --      Estimated Creatinine Clearance: 90.3 mL/min (by C-G formula based on SCr of 0.95 mg/dL).   Medical History: Past Medical History:  Diagnosis Date   Atrial fibrillation ([place]) SEP 2015   Essential hypertension    GERD (gastroesophageal reflux disease)    Helicobacter pylori gastritis Dec 2015   Treated with Pylera   Sleep apnea     Medications:  Medications Prior to Admission  Medication Sig Dispense Refill Last Dose   apixaban (ELIQUIS) 5 MG TABS tablet TAKE ONE TABLET (5MG  TOTAL) BY MOUTH TWOTIMES DAILY 60 tablet 5 10/27/2021 at 0800   brimonidine (ALPHAGAN) 0.2 % ophthalmic solution Place 1 drop into the right eye 2 (two) times daily.      diltiazem (CARDIZEM CD) 120 MG 24 hr capsule TAKE TWO CAPSULES BY MOUTH IN THE MORNING AND ONE CAPSULE IN THE EVENING 270 capsule 3    losartan (COZAAR) 50 MG tablet TAKE ONE (1) TABLET BY MOUTH EVERY DAY 90 tablet 3    Omega-3 Fatty Acids (FISH OIL) 1000 MG CAPS Take 1 capsule by mouth daily.      pantoprazole (PROTONIX) 40 MG tablet Take 1 tablet (40 mg total) by mouth daily. (Patient taking differently: Take 40 mg by mouth at bedtime.) 30 tablet 11    prednisoLONE acetate (PRED FORTE) 1 % ophthalmic suspension Place 1 drop into both eyes in the morning and at bedtime.      tamsulosin (FLOMAX) 0.4 MG CAPS capsule Take 0.4 mg by mouth every  evening.      traMADol (ULTRAM) 50 MG tablet Take 1 tablet (50 mg total) by mouth every 12 (twelve) hours as needed. (Patient taking differently: Take 50 mg by mouth every 12 (twelve) hours as needed for moderate pain or severe pain.) 28 tablet 0     Assessment: Patient admitted with acute pancreatitis. He is chronically anticoagulated with eliquis for afib. Last dose was 2/4 at 0800. Holding eliquis in case he needs procedure and transitioning to heparin. Baseline labs ordered, will follow APTT until correlates with HL  Goal of Therapy:  Heparin level 0.3-0.7 units/ml aPTT 66-102 seconds Monitor platelets by anticoagulation protocol: Yes   Plan:  Start heparin infusion at 1400 units/hr Check anti-Xa level in ~8 hours and daily while on heparin Continue to monitor H&H and platelets  Isac Sarna, BS [place], BCPS Clinical Pharmacist Pager [phone] 10/28/2021,10:34 AM

## 2021-10-28 NOTE — Assessment & Plan Note (Addendum)
Likely from hemconcentration and sleep apnea related.  Resolved

## 2021-10-28 NOTE — Assessment & Plan Note (Addendum)
Will benefit with PCP follow-up, weight loss.  Cont home CPAP

## 2021-10-28 NOTE — Progress Notes (Addendum)
ANTICOAGULATION CONSULT NOTE - Initial Consult  Pharmacy Consult for Heparin Indication: atrial fibrillation  No Known Allergies  Patient Measurements: Height: 5\' 11"  (180.3 cm) Weight: 117.6 kg (259 lb 3.2 oz) IBW/kg (Calculated) : 75.3 HEPARIN DW (KG): 101.2   Vital Signs: Temp: 97.9 F (36.6 C) (02/05 1015) Temp Source: Oral (02/05 0246) BP: 127/76 (02/05 1015) Pulse Rate: 93 (02/05 1015)  Labs: Recent Labs    10/27/21 2312 10/28/21 0136 10/28/21 0419  HGB 17.0  --  17.6*  HCT 49.7  --  54.0*  PLT 324  --  339  CREATININE 0.97  --  0.95  CKTOTAL  --   --  71  TROPONINIHS 4 3  --     Estimated Creatinine Clearance: 90.3 mL/min (by C-G formula based on SCr of 0.95 mg/dL).   Medical History: Past Medical History:  Diagnosis Date   Atrial fibrillation ([place]) SEP 2015   Essential hypertension    GERD (gastroesophageal reflux disease)    Helicobacter pylori gastritis Dec 2015   Treated with Pylera   Sleep apnea     Medications:  Medications Prior to Admission  Medication Sig Dispense Refill Last Dose   apixaban (ELIQUIS) 5 MG TABS tablet TAKE ONE TABLET (5MG  TOTAL) BY MOUTH TWOTIMES DAILY 60 tablet 5 10/27/2021 at 0800   diltiazem (CARDIZEM CD) 120 MG 24 hr capsule TAKE TWO CAPSULES BY MOUTH IN THE MORNING AND ONE CAPSULE IN THE EVENING 270 capsule 3    losartan (COZAAR) 50 MG tablet TAKE ONE (1) TABLET BY MOUTH EVERY DAY 90 tablet 3    Omega-3 Fatty Acids (FISH OIL) 1000 MG CAPS Take 1 capsule by mouth daily.      pantoprazole (PROTONIX) 40 MG tablet Take 1 tablet (40 mg total) by mouth daily. (Patient taking differently: Take 40 mg by mouth at bedtime.) 30 tablet 11    prednisoLONE acetate (PRED FORTE) 1 % ophthalmic suspension Place 1 drop into both eyes in the morning and at bedtime.      tamsulosin (FLOMAX) 0.4 MG CAPS capsule Take 0.4 mg by mouth every evening.      traMADol (ULTRAM) 50 MG tablet Take 1 tablet (50 mg total) by mouth every 12 (twelve) hours as  needed. (Patient taking differently: Take 50 mg by mouth every 12 (twelve) hours as needed for moderate pain or severe pain.) 28 tablet 0     Assessment: Patient admitted with acute pancreatitis. He is chronically anticoagulated with eliquis for afib. Last dose was 2/4 at 0800. Holding eliquis in case he needs procedure and transitioning to heparin. Baseline labs ordered, will follow APTT until correlates with HL  Goal of Therapy:  Heparin level 0.3-0.7 units/ml aPTT 66-102 seconds Monitor platelets by anticoagulation protocol: Yes   Plan:  Start heparin infusion at 1400 units/hr Check anti-Xa level in ~8 hours and daily while on heparin Continue to monitor H&H and platelets  Isac Sarna, BS [place], BCPS Clinical Pharmacist Pager [phone] 10/28/2021,10:27 AM

## 2021-10-28 NOTE — Hospital Course (Addendum)
76 year old M with PMH of A-fib on Eliquis, HTN, GERD/erosive esophagitis, H. Pylori and OSA presenting with acute onset cramping epigastric abdominal pain radiating to his right abdomen after he had a dinner, and admitted for acute pancreatitis.  Lipase elevated to 1680.  Mild LFT elevation with pattern consistent with EtOH or rhabdo but CK within normal.  Patient admits to drinking 2 to 3 glasses of wine before bedtime.  CTA C/A/P concerning for acute pancreatitis without necrosis or pseudocyst.  CT also showed fluid distention of the stomach and esophagus concerning for GOO or an GERD.  Started on IV fluid, IV analgesics and antiemetics, and admitted.  GI consulted.    RUQ [place] negative for gallstone or signs of acute cholecystitis but hepatic steatosis.  Pancreatitis improving clinically and chemically.  However, patient developed respiratory distress with hypoxia requiring supplemental oxygen likely from atelectasis and IV fluid.  IV fluid discontinued.  Started on IV Lasix. CT chest 2/7-New development of bilateral basilar consolidation or atelectasis in the lungs, New finding of peripancreatic gas suggesting necrotizing infection or fistula. Patient further completed CT abdomen.2/11-although clinically stable had bump in WBC count discussed with ID, IR and GI-for possible aspiration but no fluid to aspirate as per IR, continued on IV meropenem-with plan to complete 7 days course.GI signed off 2/12.

## 2021-10-28 NOTE — Progress Notes (Signed)
PROGRESS NOTE  Sean Golden FBP:[id number] DOB: 16-Nov-1945   PCP: Celene Squibb, MD  Patient is from: Home.  Lives with his wife.  Independently ambulates at baseline.  DOA: 10/27/2021 LOS: 0  Chief complaints:  Chief Complaint  Patient presents with   Abdominal Pain     Brief Narrative / Interim history: 76 year old M with PMH of A-fib on Eliquis, HTN, GERD/erosive esophagitis, H. Pylori and OSA presenting with acute onset cramping epigastric abdominal pain radiating to his right abdomen after he had a dinner, and admitted for acute pancreatitis.  Lipase elevated to 1680.  Mild LFT elevation with pattern consistent with EtOH or rhabdo but CK within normal.  Patient admits to drinking 2 to 3 glasses of wine before bedtime.  CTA C/A/P concerning for acute pancreatitis without necrosis or pseudocyst.  CT also showed fluid distention of the stomach and esophagus concerning for GOO or an GERD.  Started on IV fluid, IV analgesics and antiemetics, and admitted.  GI consulted.    RUQ [place] negative for gallstone or signs of acute cholecystitis but hepatic steatosis.   Subjective: Seen and examined earlier this morning.  Patient's wife at bedside.  Continues to endorse epigastric abdominal pain radiating to right side of his abdomen.  Also reports nausea and reflux.  Feels his mouth is dry.  He denies fever or emesis.   Objective: Vitals:   10/28/21 0320 10/28/21 0408 10/28/21 0716 10/28/21 1015  BP: 128/82  132/74 127/76  Pulse: 78  60 93  Resp: (!) 22   18  Temp: 98.2 F (36.8 C)  97.6 F (36.4 C) 97.9 F (36.6 C)  TempSrc:      SpO2: 93%  93% 92%  Weight: 117.6 kg 117.6 kg    Height: 5\' 11"  (1.803 m) 5\' 11"  (1.803 m)      Examination:  GENERAL: No apparent distress.  Nontoxic. HEENT: MMM.  Vision and hearing grossly intact.  NECK: Supple.  No apparent JVD.  RESP: 93% on RA.  No IWOB.  Fair aeration bilaterally. CVS:  RRR. Heart sounds normal.  ABD/GI/GU: BS+. Abd soft.  Diffuse  tenderness. MSK/EXT:  Moves extremities. No apparent deformity. No edema.  SKIN: no apparent skin lesion or wound NEURO: Awake, alert and oriented appropriately.  No apparent focal neuro deficit. PSYCH: Calm. Normal affect.   Procedures:  None  Microbiology summarized: [id number]-[id number] and influenza PCR nonreactive.  Assessment and Plan: * Acute pancreatitis- (present on admission) CT consistent with acute pancreatitis without necrosis or pseudocyst.  Lipase elevated at 1680. Suspect alcoholic pancreatitis.  Drinks 2 glasses of wine a day, sometimes 3.  He has slight AST/ALT elevation with a pattern consistent with alcohol.  ALP within normal arguing against obstructive etiology.  Total bili slightly elevated this morning.  Patient is also on losartan which is class Ib because of acute pancreatitis.  Continues to endorse diffuse abdominal pain from epigastric area to right side.  Still with cramping epigastric and right-sided abdominal pain, nausea but no emesis.  LBM the morning of admission.  Does not recall passing gas.  Lactic acid elevated at 3.6 likely from dehydration versus ischemic bowel.  CT also concerning for GOO but KUB reassuring this morning.  Had no emesis overnight or this morning -Give IV NS bolus 1 L -Increase maintenance to 125 cc an hour -Change PPI to IV and increase to twice daily -Changed morphine to Dilaudid -Continue antiemetics -Follow RUQ [place] -Continue n.p.o. pending GI evaluation. -Hold Eliquis in case  he needs procedure.  Start IV heparin for A-fib.  Transaminitis with hyperbilirubinemia- (present on admission) Pattern consistent with alcohol or rhabdo but CK within normal.  Doubt obstructive etiology with normal ALP. -Continue trending -Follow RUQ [place]  Lactic acidosis Likely from dehydration versus ischemic bowel. -Normal saline bolus 1 L x 1 -Increase maintenance IV fluid -Recheck  Bandemia- (present on admission) Likely demargination versus  infection. -Monitor  Polycythemia Likely secondary from hemoconcentration and sleep apnea -IV fluid for hydration. -Recheck in the morning  Gastroesophageal reflux disease- (present on admission) EGD in 05/2004 with erosive esophagitis.  Fluid within distal esophagus suggesting GERD.  He reports heartburn - IV Protonix 40 mg twice daily -Elevate head of the bed  Hypokalemia- (present on admission) Resolved. -Monitor  Atrial fibrillation ([place])- (present on admission) Had an episode of mild RVR overnight. -IV metoprolol 2.5 mg every 6 hours while NPO -IV heparin for anticoagulation -Continue holding Cardizem and Eliquis while NPO. -Optimize Mg and K  Essential hypertension- (present on admission) Normotensive -Hold losartan and Cardizem -IV metoprolol 2.5 mg every 6 hours  Class II obesity Encourage lifestyle change to lose weight.  Mixed hyperlipidemia- (present on admission) - Hold statin  Sleep apnea- (present on admission) Nightly CPAP         Body mass index is 36.15 kg/m.         DVT prophylaxis:  heparin injection 5,000 Units Start: 10/28/21 0600 SCDs Start: 10/28/21 0322  Code Status: DNR/DNI Family Communication: Updated patient's wife at bedside. Level of care: Telemetry Status is: Inpatient Remains inpatient appropriate because: Due to acute pancreatitis     Final disposition: Likely home once acute pancreatitis resolves.      Consultants:  Gastroenterology   Sch Meds:  Scheduled Meds:  heparin injection (subcutaneous)  5,000 Units Subcutaneous Q8H   metoprolol tartrate  2.5 mg Intravenous Q6H   pantoprazole (PROTONIX) IV  40 mg Intravenous Q12H   tamsulosin  0.4 mg Oral QPM   Continuous Infusions:  sodium chloride 125 mL/hr at 10/28/21 0839   magnesium sulfate bolus IVPB     PRN Meds:.acetaminophen **OR** acetaminophen, HYDROmorphone (DILAUDID) injection, ondansetron **OR** ondansetron (ZOFRAN)  IV  Antimicrobials: Anti-infectives (From admission, onward)    None        I have personally reviewed the following labs and images: CBC: Recent Labs  Lab 10/27/21 2312 10/28/21 0419  WBC 15.7* 12.1*  NEUTROABS  --  11.0*  HGB 17.0 17.6*  HCT 49.7 54.0*  MCV 95.0 95.7  PLT 324 339   BMP &GFR Recent Labs  Lab 10/27/21 2312 10/28/21 0419  NA 137 138  K 3.4* 3.7  CL 105 108  CO2 20* 20*  GLUCOSE 161* 176*  BUN 20 21  CREATININE 0.97 0.95  CALCIUM 8.9 8.7*  MG  --  1.7   Estimated Creatinine Clearance: 90.3 mL/min (by C-G formula based on SCr of 0.95 mg/dL). Liver & Pancreas: Recent Labs  Lab 10/27/21 2312 10/28/21 0419  AST 171* 136*  ALT 83* 100*  ALKPHOS 96 89  BILITOT 1.1 1.6*  PROT 7.0 6.7  ALBUMIN 4.0 3.8   Recent Labs  Lab 10/27/21 2312 10/28/21 0419  LIPASE 1,681* 1,678*   No results for input(s): AMMONIA in the last 168 hours. Diabetic: No results for input(s): HGBA1C in the last 72 hours. No results for input(s): GLUCAP in the last 168 hours. Cardiac Enzymes: Recent Labs  Lab 10/28/21 0419  CKTOTAL 71   No results for input(s): PROBNP  in the last 8760 hours. Coagulation Profile: No results for input(s): INR, PROTIME in the last 168 hours. Thyroid Function Tests: No results for input(s): TSH, T4TOTAL, FREET4, T3FREE, THYROIDAB in the last 72 hours. Lipid Profile: Recent Labs    10/28/21 0418  CHOL 199  HDL 41  LDLCALC 141*  TRIG 85  CHOLHDL 4.9   Anemia Panel: No results for input(s): VITAMINB12, FOLATE, FERRITIN, TIBC, IRON, RETICCTPCT in the last 72 hours. Urine analysis:    Component Value Date/Time   COLORURINE YELLOW 05/22/2021 1707   APPEARANCEUR HAZY (A) 05/22/2021 1707   LABSPEC 1.017 05/22/2021 1707   PHURINE 5.0 05/22/2021 1707   GLUCOSEU NEGATIVE 05/22/2021 1707   HGBUR NEGATIVE 05/22/2021 1707   BILIRUBINUR NEGATIVE 05/22/2021 1707   KETONESUR NEGATIVE 05/22/2021 1707   PROTEINUR NEGATIVE 05/22/2021 1707    NITRITE NEGATIVE 05/22/2021 1707   LEUKOCYTESUR NEGATIVE 05/22/2021 1707   Sepsis Labs: Invalid input(s): PROCALCITONIN, [place]  Microbiology: Recent Results (from the past 240 hour(s))  Resp Panel by RT-PCR (Flu A&B, Covid) Nasopharyngeal Swab     Status: None   Collection Time: 10/28/21  3:00 AM   Specimen: Nasopharyngeal Swab; Nasopharyngeal(NP) swabs in vial transport medium  Result Value Ref Range Status   SARS Coronavirus 2 by RT PCR NEGATIVE NEGATIVE Final    Comment: (NOTE) SARS-CoV-2 target nucleic acids are NOT DETECTED.  The SARS-CoV-2 RNA is generally detectable in upper respiratory specimens during the acute phase of infection. The lowest concentration of SARS-CoV-2 viral copies this assay can detect is 138 copies/mL. A negative result does not preclude SARS-Cov-2 infection and should not be used as the sole basis for treatment or other patient management decisions. A negative result may occur with  improper specimen collection/handling, submission of specimen other than nasopharyngeal swab, presence of viral mutation(s) within the areas targeted by this assay, and inadequate number of viral copies(<138 copies/mL). A negative result must be combined with clinical observations, patient history, and epidemiological information. The expected result is Negative.  Fact Sheet for Patients:  EntrepreneurPulse.com.au  Fact Sheet for Healthcare Providers:  IncredibleEmployment.be  This test is no t yet approved or cleared by the [place] FDA and  has been authorized for detection and/or diagnosis of SARS-CoV-2 by FDA under an Emergency Use Authorization (EUA). This EUA will remain  in effect (meaning this test can be used) for the duration of the COVID-19 declaration under Section 564(b)(1) of the Act, 21 U.S.C.section 360bbb-3(b)(1), unless the authorization is terminated  or revoked sooner.       Influenza A by PCR  NEGATIVE NEGATIVE Final   Influenza B by PCR NEGATIVE NEGATIVE Final    Comment: (NOTE) The Xpert Xpress SARS-CoV-2/FLU/RSV plus assay is intended as an aid in the diagnosis of influenza from Nasopharyngeal swab specimens and should not be used as a sole basis for treatment. Nasal washings and aspirates are unacceptable for Xpert Xpress SARS-CoV-2/FLU/RSV testing.  Fact Sheet for Patients: EntrepreneurPulse.com.au  Fact Sheet for Healthcare Providers: IncredibleEmployment.be  This test is not yet approved or cleared by the [place] FDA and has been authorized for detection and/or diagnosis of SARS-CoV-2 by FDA under an Emergency Use Authorization (EUA). This EUA will remain in effect (meaning this test can be used) for the duration of the COVID-19 declaration under Section 564(b)(1) of the Act, 21 U.S.C. section 360bbb-3(b)(1), unless the authorization is terminated or revoked.  Performed at [facility], [address] [place], [place] [address]     Radiology Studies:  DG Chest Port 1 View  Result Date: 10/27/2021 CLINICAL DATA:  Epigastric pain. EXAM: PORTABLE CHEST 1 VIEW COMPARISON:  08/17/2009 FINDINGS: Shallow inspiration with atelectasis in the lung bases. Heart size and pulmonary vascularity are normal. No airspace disease or consolidation. Mediastinal contours appear intact. IMPRESSION: Shallow inspiration with atelectasis in the bases. Electronically Signed   By: Lucienne Capers M.D.   On: 10/27/2021 23:34   DG Abd Portable 1V  Result Date: 10/28/2021 CLINICAL DATA:  Abdominal pain. EXAM: PORTABLE ABDOMEN - 1 VIEW COMPARISON:  CTA chest abdomen pelvis 7 hours ago. FINDINGS: No gaseous bowel dilatation to suggest obstruction. Contrast excretion noted both kidneys with accumulation of contrast in the bladder compatible with CT performed earlier today. Visualized bony anatomy shows no acute finding. IMPRESSION: Negative. Electronically  Signed   By: Misty Stanley M.D.   On: 10/28/2021 08:57   CT Angio Chest/Abd/Pel for Dissection W and/or Wo Contrast  Result Date: 10/28/2021 CLINICAL DATA:  Dyspnea, epigastric abdominal pain EXAM: CT ANGIOGRAPHY CHEST, ABDOMEN AND PELVIS TECHNIQUE: Non-contrast CT of the chest was initially obtained. Multidetector CT imaging through the chest, abdomen and pelvis was performed using the standard protocol during bolus administration of intravenous contrast. Multiplanar reconstructed images and MIPs were obtained and reviewed to evaluate the vascular anatomy. RADIATION DOSE REDUCTION: This exam was performed according to the departmental dose-optimization program which includes automated exposure control, adjustment of the mA and/or kV according to patient size and/or use of iterative reconstruction technique. CONTRAST:  134mL OMNIPAQUE IOHEXOL 350 MG/ML SOLN COMPARISON:  None. FINDINGS: CTA CHEST FINDINGS Cardiovascular: The thoracic aorta is normal in course and caliber; no intramural hematoma, dissection, or aneurysm. Minimal atherosclerotic calcification. No significant coronary artery calcification. Global cardiac size within normal limits. No pericardial effusion. Central pulmonary arteries are of normal caliber. Mediastinum/Nodes: Visualized thyroid is unremarkable. No pathologic thoracic adenopathy. The esophagus is fluid-filled, likely reflecting the sequela of gastroesophageal reflux given gastric distension. Lungs/Pleura: Mild bilateral dependent atelectasis. The lungs are otherwise clear. No pneumothorax or pleural effusion. Musculoskeletal: No acute bone abnormality Review of the MIP images confirms the above findings. CTA ABDOMEN AND PELVIS FINDINGS VASCULAR Aorta: Normal caliber. No aneurysm or dissection. Mild atherosclerotic calcification. No periaortic inflammatory change. Celiac: Unremarkable SMA: Replaced right hepatic artery.  Otherwise unremarkable. Renals: Dual renal arteries are noted  bilaterally. Atherosclerotic calcification results in less than 50% stenoses of the dominant renal arteries at their origins bilaterally. Normal vascular morphology. No aneurysm or dissection. IMA: Widely patent. Inflow: Widely patent. No aneurysm or dissection. Internal iliac arteries are patent bilaterally. Veins: No obvious venous abnormality within the limitations of this arterial phase study. Review of the MIP images confirms the above findings. NON-VASCULAR Hepatobiliary: No focal liver abnormality is seen. No gallstones, gallbladder wall thickening, or biliary dilatation. Pancreas: There is extensive peripancreatic inflammatory stranding with inflammatory fluid tracking into the mesenteric root and into the right anterior pararenal space. Normal enhancement of the pancreatic parenchyma. No loculated intrapancreatic or peripancreatic fluid collections. The pancreatic duct is not dilated. Altogether, the findings are in keeping with changes of acute interstitial/edematous pancreatitis. Spleen: Unremarkable. Adrenals/Urinary Tract: Adrenal glands are unremarkable. Kidneys are normal, without renal calculi, focal lesion, or hydronephrosis. Bladder is unremarkable. Stomach/Bowel: The stomach is distended and fluid filled suggesting changes of gastric outlet obstruction. There is hyperemia of the duodenum as well as extensive periduodenal inflammatory stranding likely related to the adjacent inflammatory process involving the pancreas. The small bowel is unremarkable. There is extensive distal transverse,  descending, and sigmoid colonic diverticulosis as well as ascending colonic diverticulosis. No superimposed acute inflammatory change. Appendix normal. No free intraperitoneal gas or fluid. Lymphatic: No pathologic adenopathy. Reproductive: Prostate is unremarkable. Other: Small bilateral fat containing inguinal hernias and umbilical hernia are identified. Musculoskeletal: Degenerative changes are seen within the  lumbar spine. No lytic or blastic bone lesion. No acute bone abnormality. Review of the MIP images confirms the above findings. IMPRESSION: No evidence of aortic dissection or aneurysm. Mild atherosclerotic calcification. No evidence of hemodynamically significant stenosis involving the visceral arterial vasculature or lower extremity arterial inflow. Findings in keeping with acute interstitial/edematous pancreatitis. No evidence of pancreatic or peripancreatic necrosis at this time. Duodenal hyperemia likely related to the adjacent inflammatory process. Fluid distension of the stomach likely related to gastric outlet obstruction secondary to the adjacent inflammatory process. Fluid within the distal esophagus in keeping with gastroesophageal reflux. Aortic Atherosclerosis (ICD10-I70.0). Electronically Signed   By: Fidela Salisbury M.D.   On: 10/28/2021 01:31   US Abdomen Limited RUQ (LIVER/GB)  Result Date: 10/28/2021 CLINICAL DATA:  Acute pancreatitis. EXAM: ULTRASOUND ABDOMEN LIMITED RIGHT UPPER QUADRANT COMPARISON:  CT 10/28/2021 FINDINGS: Gallbladder: No gallstones or wall thickening visualized. No sonographic Murphy sign noted by sonographer. Common bile duct: Diameter: 5.5 mm.  No intrahepatic bile duct dilatation. Liver: The liver has a heterogeneous echotexture and slightly irregular contour. No focal liver abnormality identified. Trace perihepatic ascites portal vein is patent on color Doppler imaging with normal direction of blood flow towards the liver. Other: None. IMPRESSION: 1. No gallstones or signs of acute cholecystitis. 2. Echogenic liver compatible with hepatic steatosis 3. Trace perihepatic free fluid Electronically Signed   By: Kerby Moors M.D.   On: 10/28/2021 09:41       T. [place]  If 7PM-7AM, please contact night-coverage [email or web address] 10/28/2021, 10:16 AM

## 2021-10-28 NOTE — Progress Notes (Signed)
Patient was offered NG tube given the distention of his stomach on CT and the gastric outlet obstruction. At this time, he would like to wait and see if his pain can be controlled with pain meds.

## 2021-10-28 NOTE — Assessment & Plan Note (Addendum)
In the setting of pancreatitis and dehydration. Resolved.

## 2021-10-28 NOTE — H&P (Signed)
History and Physical    Patient: Sean Golden OBS:[id number] DOB: 05/10/1946 DOA: 10/27/2021 DOS: the patient was seen and examined on 10/28/2021 PCP: Celene Squibb, MD  Patient coming from: Home  Chief Complaint:  Chief Complaint  Patient presents with   Abdominal Pain    HPI: Sean Golden is a 76 y.o. male with medical history significant of with history of afib, essential hypertension, GERD, H. pylori, sleep apnea, and more presents the ED with a chief complaint of stomach pain.  Patient reports he had dinner and at 6:30 PM he had acute onset of severe, cramping epigastric pain.  After some time the pain started radiating to the right and down slightly to the level of the umbilicus.  Patient reports he has had nausea and dry heaves but no vomiting.  He has not had any loose stools.  His last normal bowel movement was the previous day.  Patient has never had pancreatitis before.  Patient has never had gallbladder concerns before.  He does not have any fevers.  He did have some hypoxia in the ER from breathing shallow because it hurts to take a deep breath, but has been maintaining his oxygen sats with 2 L nasal cannula.  He denies any melena, hematochezia, dysuria, hematuria.  Patient denies any trauma to his abdomen, change in medication, trips to the [facility], familial lipid disorders, but he does drink.  He drinks about 2 glasses of wine per evening.  Patient has no other complaints at this time.  Patient does not smoke, he does not use illicit drugs.  He is vaccinated for COVID.  Patient is DNR.  Review of Systems: As mentioned in the history of present illness. All other systems reviewed and are negative. Past Medical History:  Diagnosis Date   Atrial fibrillation ([place]) SEP 2015   Essential hypertension    GERD (gastroesophageal reflux disease)    Helicobacter pylori gastritis Dec 2015   Treated with Pylera   Sleep apnea    Past Surgical History:  Procedure Laterality Date    CARDIAC ELECTROPHYSIOLOGY STUDY AND ABLATION  2010?   COLONOSCOPY  2005   SOLITARY RECTAL ULCER   COLONOSCOPY N/A 09/06/2014   Dr. Oneida Alar: three colon polyps, moderate diverticulosis throughout the entire examined colon   ESOPHAGOGASTRODUODENOSCOPY N/A 09/06/2014   Dr. Fields:moderate erosive gastritis, no Barrett's. +H.pylori gastritis, treated with Pylera   HEMORRHOID BANDING N/A 09/06/2014   NO BANDING PERFORMED. NO INTERNAL HEMORRHOIDS IDENTIFIED    UPPER GASTROINTESTINAL ENDOSCOPY  2005 [facility]   VASECTOMY     Social History:  reports that he quit smoking about 37 years ago. His smoking use included cigarettes. He started smoking about 54 years ago. He has a 7.50 pack-year smoking history. He has never used smokeless tobacco. He reports current alcohol use of about 14.0 standard drinks per week. He reports that he does not use drugs.  No Known Allergies  Family History  Problem Relation Age of Onset   Hypertension Mother    Heart attack Father    Hyperlipidemia Sister    Hyperlipidemia Brother    Colon polyps Brother    Hypertension Brother    Hypertension Sister    Diabetes Brother    Stroke Other    Diabetes Other    Hypertension Other    Hyperlipidemia Other    Colon cancer Neg Hx     Prior to Admission medications   Medication Sig Start Date End Date Taking? Authorizing Provider  apixaban Arne Cleveland)  5 MG TABS tablet TAKE ONE TABLET (5MG  TOTAL) BY MOUTH TWOTIMES DAILY 08/20/21   Satira Sark, MD  diltiazem (CARDIZEM CD) 120 MG 24 hr capsule TAKE TWO CAPSULES BY MOUTH IN THE MORNING AND ONE CAPSULE IN THE EVENING 10/25/20   Satira Sark, MD  losartan (COZAAR) 50 MG tablet TAKE ONE (1) TABLET BY MOUTH EVERY DAY 12/18/20   Satira Sark, MD  Omega-3 Fatty Acids (FISH OIL) 1000 MG CAPS Take 1 capsule by mouth daily.    [provider]  pantoprazole (PROTONIX) 40 MG tablet Take 1 tablet (40 mg total) by mouth daily. Patient taking differently: Take 40 mg  by mouth at bedtime. 09/30/15   Mahala Menghini, PA-C  prednisoLONE acetate (PRED FORTE) 1 % ophthalmic suspension Place 1 drop into both eyes in the morning and at bedtime. 05/18/21   [provider]  tamsulosin (FLOMAX) 0.4 MG CAPS capsule Take 0.4 mg by mouth every evening.    [provider]  traMADol (ULTRAM) 50 MG tablet Take 1 tablet (50 mg total) by mouth every 12 (twelve) hours as needed. Patient taking differently: Take 50 mg by mouth every 12 (twelve) hours as needed for moderate pain or severe pain. 05/15/21   Mordecai Rasmussen, MD    Physical Exam: Vitals:   10/28/21 0230 10/28/21 0246 10/28/21 0320 10/28/21 0408  BP: (!) 122/91  128/82   Pulse: 93 94 78   Resp: (!) 24 (!) 22 (!) 22   Temp: 97.9 F (36.6 C) 98.1 F (36.7 C) 98.2 F (36.8 C)   TempSrc: Oral Oral    SpO2: 90% 94% 93%   Weight:   117.6 kg 117.6 kg  Height:   5\' 11"  (1.803 m) 5\' 11"  (1.803 m)   1.  General: Patient lying supine in bed, repositioning often, visibly in pain   2. Psychiatric: Alert and oriented x 3, mood and behavior normal for situation, very pleasant and cooperative with exam   3. Neurologic: Speech and language are normal, face is symmetric, moves all 4 extremities voluntarily, at baseline without acute deficits on limited exam   4. HEENMT:  Head is atraumatic, normocephalic, pupils reactive to light, neck is supple, trachea is midline, mucous membranes are moist   5. Respiratory : Initially shallow breaths, but will take a deep breath if prompted, lungs are clear to auscultation bilaterally without wheezing, rhonchi, rales, no cyanosis, no increase in work of breathing or accessory muscle use   6. Cardiovascular : Heart rate normal, rhythm is regular, no murmurs, rubs or gallops, no peripheral edema, peripheral pulses palpated   7. Gastrointestinal:  Abdomen is taut but not rigid, mildly distended, diffusely tender to palpation, but most tender in the epigastric region,  bowel sounds active, no masses or organomegaly palpated   8. Skin:  Skin is warm, dry and intact without rashes, acute lesions, or ulcers on limited exam   9.Musculoskeletal:  No acute deformities or trauma, no asymmetry in tone, no peripheral edema, peripheral pulses palpated, no tenderness to palpation in the extremities   Data Reviewed: In the ED Patient is afebrile, initially tachycardic but better after pain meds, initially tachypneic but also better after pain meds, blood pressure range between 94/62-130/86, satting from 88 to 93% Patient has a leukocytosis 15.7, hemoglobin 17.0 Mild hypokalemia at 3.4 Normal and downtrending trops 4, 3 CT shows no aortic dissection or aneurysm.  Mild atherosclerotic calcification.  No hemodynamically significant stenosis.  Acute interstitial edematous pancreatitis  with no necrosis at this time.  Duodenal hyperemia is likely reactive to the inflammatory process, and fluid distention of the stomach likely due to a gastric outlet obstruction that is also secondary to the inflammatory process Chest x-ray shows no acute changes EKG shows a heart rate of 99, atrial fibrillation, QTc 459 Admission was requested for acute pancreatitis  Assessment and Plan: * Acute pancreatitis- (present on admission) Patient had acute onset of pain in the epigastrium Lipase 1681 CT shows acute pancreatitis with significant inflammation Given the transaminitis associated, this is most likely gallstone pancreatitis Patient drinks but only 2 glasses of wine per night-less likely an alcohol induced pancreatitis Right upper quadrant ultrasound in the a.m. Lipid panel pending Holding Eliquis in case any procedures have to be done Pain control, n.p.o., IV maintenance fluids Continue to monitor  Transaminitis- (present on admission) Transaminitis with an AST of 171, ALT of 83 Likely of gallstone obstruction Right upper quadrant ultrasound in the a.m. N.p.o. Patient does  not have jaundice, patient does not have fever Continue to monitor  Hypokalemia- (present on admission) Mild hypokalemia 3.4 Replace and recheck in the a.m.  Mixed hyperlipidemia- (present on admission) Patient is not currently on a statin medication Lipid panel pending  Gastroesophageal reflux disease- (present on admission) Continue Protonix Continue to monitor  Atrial fibrillation ([place])- (present on admission) Holding Eliquis in case of inpatient procedure Continue Cardizem Continue to monitor  Essential hypertension- (present on admission) Continue losartan Continue to monitor       Advance Care Planning:   Code Status: Full Code   Consults: Consult GI in case of ERCP needing to be done  Family Communication: Wife at bedside, all questions answered.  Severity of Illness: The appropriate patient status for this patient is INPATIENT. Inpatient status is judged to be reasonable and necessary in order to provide the required intensity of service to ensure the patient's safety. The patient's presenting symptoms, physical exam findings, and initial radiographic and laboratory data in the context of their chronic comorbidities is felt to place them at high risk for further clinical deterioration. Furthermore, it is not anticipated that the patient will be medically stable for discharge from the hospital within 2 midnights of admission.   * I certify that at the point of admission it is my clinical judgment that the patient will require inpatient hospital care spanning beyond 2 midnights from the point of admission due to high intensity of service, high risk for further deterioration and high frequency of surveillance required.*  Author: Rolla Plate, DO 10/28/2021 5:29 AM  For on call review [email or web address].

## 2021-10-28 NOTE — Assessment & Plan Note (Addendum)
Bandemia likely in the setting of pancreatitis.  Initial leukocytosis had resolved but started uptrending, now improving,complete antibiotics.See acute pancreatitis.procalcitonin improving. Recent Labs  Lab 10/31/21 0400 11/01/21 0351 11/02/21 0309 11/03/21 0554 11/04/21 0318 11/05/21 0351 11/06/21 0444  WBC 8.6   < > 13.5* 23.5* 26.3* 24.3* 19.2*  LATICACIDVEN 1.6  --   --   --   --   --   --   PROCALCITON  --   --   --   --  1.62 1.20 0.96   < > = values in this interval not displayed.

## 2021-10-28 NOTE — Consult Note (Signed)
Referring Provider: Wendee Beavers, MD Primary Care Physician:  Celene Squibb, MD Primary Gastroenterologist:  Dr. Laural Golden  Reason for Consultation:    Acute pancreatitis.  HPI:   Patient is 76 year old Caucasian male who was in usual state of health until about 30 minutes after he ate ate his evening meal yesterday when he developed pain across upper abdomen.  Over the next couple hours pain became intense and seem to involve upper half of the abdomen.  He had nausea with heaving and vomited small amount of food.  No hematemesis.  He did not experience fever chills or diaphoresis.  He also did not experience shortness of breath. Patient came to emergency room just before midnight last night. Lab studies revealed WBC of 15.7 with H&H of 17 and 49.7 with platelet count of 324K.  Electrolytes were normal.  Glucose is 161.  BUN 20 creatinine 0.97 and calcium was 8.9.  Bilirubin was 1.1 AP 96 AST 171 ALT 83 total protein 7.0 with albumin of 4.0. He underwent CTA chest abdomen and pelvis.  While there was no evidence of aortic dissection he was noted to have extensive peripancreatic inflammatory stranding with fluid tracking intermesenteric road and into right anterior pararenal space.  Stomach was distended and fluid-filled.  There was some hyperemia to duodenal wall felt to be secondary to pancreatitis.  There was no fluid collection. Patient is admitted to hospitalist service.  He was given IV fluids IV PPI and made NPO.  He had abdominal ultrasound this morning and it is negative for cholelithiasis or dilated bile duct.  Patient says his pain control is satisfactory.  It hurts when he takes a deep breath or if he coughs or laughs.  His wife says he did void twice this morning but passed small amount of urine.  He denies chest pain or shortness of breath. There is no history of pancreatitis.  Family history of pancreatitis is negative.  Patient states that he drinks 2 to 3 glasses of wine virtually every  night with supper.  He does not drink any more than that.  He does not take OTC NSAIDs. His appetite has been normal.  He has not lost any weight recently. He was working until September 2022.  He developed sudden decrease in his vision leading to his early retirement.  He was diagnosed with glaucoma eye syndrome.  He is seen 3 different eye specialist.  He tells me that his visual impairment is due to a viral infection with good prognosis.  He is able to drive using glasses.  He is married.  He has 2 daughters in good health.  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  He smokes cigarettes about a pack a day for 15 years but quit in 1986.  History of alcohol as above.  Father died of MI at age 26.  He also was diabetic.  Mother lived to be 77.  1 brother died of leukemia at age 80.  Another brother lived to be in his 59s.  He had coronary artery disease and diabetes mellitus.  He has 1 brother and 2 sisters living.  Past Medical History:  Diagnosis Date   Atrial fibrillation ([place]) SEP 2015   Essential hypertension    GERD (gastroesophageal reflux disease)    Helicobacter pylori gastritis Dec 2015   Treated with Pylera   Sleep apnea     Past Surgical History:  Procedure Laterality Date   CARDIAC ELECTROPHYSIOLOGY STUDY AND ABLATION  2010?   COLONOSCOPY  2005   SOLITARY RECTAL ULCER   COLONOSCOPY N/A 09/06/2014   Dr. Oneida Alar: three colon polyps, moderate diverticulosis throughout the entire examined colon   ESOPHAGOGASTRODUODENOSCOPY N/A 09/06/2014   Dr. Fields:moderate erosive gastritis, no Barrett's. +H.pylori gastritis, treated with Pylera   HEMORRHOID BANDING N/A 09/06/2014   NO BANDING PERFORMED. NO INTERNAL HEMORRHOIDS IDENTIFIED    UPPER GASTROINTESTINAL ENDOSCOPY  2005 [facility]   VASECTOMY      Prior to Admission medications   Medication Sig Start Date End Date Taking? Authorizing Provider  apixaban (ELIQUIS) 5 MG TABS tablet TAKE ONE TABLET (5MG  TOTAL) BY  MOUTH TWOTIMES DAILY 08/20/21  Yes Satira Sark, MD  brimonidine (ALPHAGAN) 0.2 % ophthalmic solution Place 1 drop into the right eye 2 (two) times daily. 10/18/21  Yes [provider]  diltiazem (CARDIZEM CD) 120 MG 24 hr capsule TAKE TWO CAPSULES BY MOUTH IN THE MORNING AND ONE CAPSULE IN THE EVENING 10/25/20  Yes Satira Sark, MD  losartan (COZAAR) 50 MG tablet TAKE ONE (1) TABLET BY MOUTH EVERY DAY 12/18/20  Yes Satira Sark, MD  Omega-3 Fatty Acids (FISH OIL) 1000 MG CAPS Take 1 capsule by mouth daily.   Yes [provider]  pantoprazole (PROTONIX) 40 MG tablet Take 1 tablet (40 mg total) by mouth daily. Patient taking differently: Take 40 mg by mouth at bedtime. 09/30/15  Yes Mahala Menghini, PA-C  tamsulosin (FLOMAX) 0.4 MG CAPS capsule Take 0.4 mg by mouth every evening.   Yes [provider]  traMADol (ULTRAM) 50 MG tablet Take 1 tablet (50 mg total) by mouth every 12 (twelve) hours as needed. Patient not taking: Reported on 10/28/2021 05/15/21   Mordecai Rasmussen, MD    Current Facility-Administered Medications  Medication Dose Route Frequency Provider Last Rate Last Admin   0.9 %  sodium chloride infusion   Intravenous Continuous Mercy Riding, MD 125 mL/hr at 10/28/21 4166 Rate Change at 10/28/21 0630   acetaminophen (TYLENOL) tablet 650 mg  650 mg Oral Q6H PRN Zierle-Ghosh, Asia B, DO       Or   acetaminophen (TYLENOL) suppository 650 mg  650 mg Rectal Q6H PRN Zierle-Ghosh, Asia B, DO       heparin ADULT infusion 100 units/mL (25000 units/237mL)  1,400 Units/hr Intravenous Continuous Gonfa, Taye T, MD       HYDROmorphone (DILAUDID) injection 0.5 mg  0.5 mg Intravenous Q2H PRN Wendee Beavers T, MD   0.5 mg at 10/28/21 1123   magnesium sulfate IVPB 2 g 50 mL  2 g Intravenous Once Wendee Beavers T, MD       metoprolol tartrate (LOPRESSOR) injection 2.5 mg  2.5 mg Intravenous Q6H Gonfa, Taye T, MD   2.5 mg at 10/28/21 1123   ondansetron (ZOFRAN) tablet 4 mg   4 mg Oral Q6H PRN Zierle-Ghosh, Asia B, DO       Or   ondansetron (ZOFRAN) injection 4 mg  4 mg Intravenous Q6H PRN Zierle-Ghosh, Asia B, DO       pantoprazole (PROTONIX) injection 40 mg  40 mg Intravenous Q12H Wendee Beavers T, MD   40 mg at 10/28/21 0950   tamsulosin (FLOMAX) capsule 0.4 mg  0.4 mg Oral QPM Zierle-Ghosh, Asia B, DO        Allergies as of 10/27/2021   (No Known Allergies)    Family History  Problem Relation Age of Onset   Hypertension Mother    Heart attack  Father    Hyperlipidemia Sister    Hyperlipidemia Brother    Colon polyps Brother    Hypertension Brother    Hypertension Sister    Diabetes Brother    Stroke Other    Diabetes Other    Hypertension Other    Hyperlipidemia Other    Colon cancer Neg Hx     Social History   Socioeconomic History   Marital status: Married    Spouse name: Not on file   Number of children: Not on file   Years of education: Not on file   Highest education level: Not on file  Occupational History   Occupation: Full time  Tobacco Use   Smoking status: Former    Packs/day: 0.50    Years: 15.00    Pack years: 7.50    Types: Cigarettes    Start date: 05/28/1967    Quit date: 05/27/1984    Years since quitting: 37.4   Smokeless tobacco: Never  Vaping Use   Vaping Use: Never used  Substance and Sexual Activity   Alcohol use: Yes    Alcohol/week: 14.0 standard drinks    Types: 14 Glasses of wine per week    Comment: 1 or 2 glasses of wine at night   Drug use: No   Sexual activity: Not on file  Other Topics Concern   Not on file  Social History Narrative   Married-2 KIDS(AGE 5 AND 38).   No regular exercise   Social Determinants of Health   Financial Resource Strain: Not on file  Food Insecurity: Not on file  Transportation Needs: Not on file  Physical Activity: Not on file  Stress: Not on file  Social Connections: Not on file  Intimate Partner Violence: Not on file    Review of Systems: See HPI, otherwise  normal ROS  Physical Exam: Temp:  [96.6 F (35.9 C)-98.2 F (36.8 C)] 97.9 F (36.6 C) (02/05 1015) Pulse Rate:  [60-112] 93 (02/05 1015) Resp:  [17-24] 18 (02/05 1015) BP: (94-132)/(62-91) 127/76 (02/05 1015) SpO2:  [88 %-94 %] 92 % (02/05 1015) Weight:  [117.6 kg-117.9 kg] 117.6 kg (02/05 0408) Last BM Date: 10/27/21  Patient is alert and appears to be in some distress if he coughs or laughs. Conjunctiva is pink.  Sclera is nonicteric. Oropharyngeal mucosa is dry otherwise normal. No neck masses thyromegaly or lymphadenopathy noted. Cardiac exam with regular rhythm normal S1 and S2.  No murmur or gallop noted. Auscultation of lungs reveal vesicular breath sounds bilaterally.  Breath sounds are diminished at bases though. Abdomen is protuberant.  Bowel sounds are normal.  On palpation abdomen is soft.  He has moderate tenderness across upper abdomen.  He is most tender mid abdomen with guarding.  No organomegaly or masses. No peripheral edema or clubbing noted.   Intake/Output from previous day: 02/04 0701 - 02/05 0700 In: 156.4 [IV Piggyback:156.4] Out: 300 [Urine:300] Intake/Output this shift: No intake/output data recorded.  Lab Results: Recent Labs    10/27/21 2312 10/28/21 0419  WBC 15.7* 12.1*  HGB 17.0 17.6*  HCT 49.7 54.0*  PLT 324 339   BMET Recent Labs    10/27/21 2312 10/28/21 0419  NA 137 138  K 3.4* 3.7  CL 105 108  CO2 20* 20*  GLUCOSE 161* 176*  BUN 20 21  CREATININE 0.97 0.95  CALCIUM 8.9 8.7*   LFT Recent Labs    10/28/21 0419  PROT 6.7  ALBUMIN 3.8  AST 136*  ALT 100*  ALKPHOS 89  BILITOT 1.6*   PT/INR No results for input(s): LABPROT, INR in the last 72 hours. Hepatitis Panel No results for input(s): HEPBSAG, HCVAB, HEPAIGM, HEPBIGM in the last 72 hours.  Studies/Results: DG Chest Port 1 View  Result Date: 10/27/2021 CLINICAL DATA:  Epigastric pain. EXAM: PORTABLE CHEST 1 VIEW COMPARISON:  08/17/2009 FINDINGS: Shallow  inspiration with atelectasis in the lung bases. Heart size and pulmonary vascularity are normal. No airspace disease or consolidation. Mediastinal contours appear intact. IMPRESSION: Shallow inspiration with atelectasis in the bases. Electronically Signed   By: Lucienne Capers M.D.   On: 10/27/2021 23:34   DG Abd Portable 1V  Result Date: 10/28/2021 CLINICAL DATA:  Abdominal pain. EXAM: PORTABLE ABDOMEN - 1 VIEW COMPARISON:  CTA chest abdomen pelvis 7 hours ago. FINDINGS: No gaseous bowel dilatation to suggest obstruction. Contrast excretion noted both kidneys with accumulation of contrast in the bladder compatible with CT performed earlier today. Visualized bony anatomy shows no acute finding. IMPRESSION: Negative. Electronically Signed   By: Misty Stanley M.D.   On: 10/28/2021 08:57   CT Angio Chest/Abd/Pel for Dissection W and/or Wo Contrast  Result Date: 10/28/2021 CLINICAL DATA:  Dyspnea, epigastric abdominal pain EXAM: CT ANGIOGRAPHY CHEST, ABDOMEN AND PELVIS TECHNIQUE: Non-contrast CT of the chest was initially obtained. Multidetector CT imaging through the chest, abdomen and pelvis was performed using the standard protocol during bolus administration of intravenous contrast. Multiplanar reconstructed images and MIPs were obtained and reviewed to evaluate the vascular anatomy. RADIATION DOSE REDUCTION: This exam was performed according to the departmental dose-optimization program which includes automated exposure control, adjustment of the mA and/or kV according to patient size and/or use of iterative reconstruction technique. CONTRAST:  180mL OMNIPAQUE IOHEXOL 350 MG/ML SOLN COMPARISON:  None. FINDINGS: CTA CHEST FINDINGS Cardiovascular: The thoracic aorta is normal in course and caliber; no intramural hematoma, dissection, or aneurysm. Minimal atherosclerotic calcification. No significant coronary artery calcification. Global cardiac size within normal limits. No pericardial effusion. Central  pulmonary arteries are of normal caliber. Mediastinum/Nodes: Visualized thyroid is unremarkable. No pathologic thoracic adenopathy. The esophagus is fluid-filled, likely reflecting the sequela of gastroesophageal reflux given gastric distension. Lungs/Pleura: Mild bilateral dependent atelectasis. The lungs are otherwise clear. No pneumothorax or pleural effusion. Musculoskeletal: No acute bone abnormality Review of the MIP images confirms the above findings. CTA ABDOMEN AND PELVIS FINDINGS VASCULAR Aorta: Normal caliber. No aneurysm or dissection. Mild atherosclerotic calcification. No periaortic inflammatory change. Celiac: Unremarkable SMA: Replaced right hepatic artery.  Otherwise unremarkable. Renals: Dual renal arteries are noted bilaterally. Atherosclerotic calcification results in less than 50% stenoses of the dominant renal arteries at their origins bilaterally. Normal vascular morphology. No aneurysm or dissection. IMA: Widely patent. Inflow: Widely patent. No aneurysm or dissection. Internal iliac arteries are patent bilaterally. Veins: No obvious venous abnormality within the limitations of this arterial phase study. Review of the MIP images confirms the above findings. NON-VASCULAR Hepatobiliary: No focal liver abnormality is seen. No gallstones, gallbladder wall thickening, or biliary dilatation. Pancreas: There is extensive peripancreatic inflammatory stranding with inflammatory fluid tracking into the mesenteric root and into the right anterior pararenal space. Normal enhancement of the pancreatic parenchyma. No loculated intrapancreatic or peripancreatic fluid collections. The pancreatic duct is not dilated. Altogether, the findings are in keeping with changes of acute interstitial/edematous pancreatitis. Spleen: Unremarkable. Adrenals/Urinary Tract: Adrenal glands are unremarkable. Kidneys are normal, without renal calculi, focal lesion, or hydronephrosis. Bladder is unremarkable. Stomach/Bowel: The  stomach is distended and fluid filled suggesting changes  of gastric outlet obstruction. There is hyperemia of the duodenum as well as extensive periduodenal inflammatory stranding likely related to the adjacent inflammatory process involving the pancreas. The small bowel is unremarkable. There is extensive distal transverse, descending, and sigmoid colonic diverticulosis as well as ascending colonic diverticulosis. No superimposed acute inflammatory change. Appendix normal. No free intraperitoneal gas or fluid. Lymphatic: No pathologic adenopathy. Reproductive: Prostate is unremarkable. Other: Small bilateral fat containing inguinal hernias and umbilical hernia are identified. Musculoskeletal: Degenerative changes are seen within the lumbar spine. No lytic or blastic bone lesion. No acute bone abnormality. Review of the MIP images confirms the above findings. IMPRESSION: No evidence of aortic dissection or aneurysm. Mild atherosclerotic calcification. No evidence of hemodynamically significant stenosis involving the visceral arterial vasculature or lower extremity arterial inflow. Findings in keeping with acute interstitial/edematous pancreatitis. No evidence of pancreatic or peripancreatic necrosis at this time. Duodenal hyperemia likely related to the adjacent inflammatory process. Fluid distension of the stomach likely related to gastric outlet obstruction secondary to the adjacent inflammatory process. Fluid within the distal esophagus in keeping with gastroesophageal reflux. Aortic Atherosclerosis (ICD10-I70.0). Electronically Signed   By: Fidela Salisbury M.D.   On: 10/28/2021 01:31   US Abdomen Limited RUQ (LIVER/GB)  Result Date: 10/28/2021 CLINICAL DATA:  Acute pancreatitis. EXAM: ULTRASOUND ABDOMEN LIMITED RIGHT UPPER QUADRANT COMPARISON:  CT 10/28/2021 FINDINGS: Gallbladder: No gallstones or wall thickening visualized. No sonographic Murphy sign noted by sonographer. Common bile duct: Diameter: 5.5 mm.   No intrahepatic bile duct dilatation. Liver: The liver has a heterogeneous echotexture and slightly irregular contour. No focal liver abnormality identified. Trace perihepatic ascites portal vein is patent on color Doppler imaging with normal direction of blood flow towards the liver. Other: None. IMPRESSION: 1. No gallstones or signs of acute cholecystitis. 2. Echogenic liver compatible with hepatic steatosis 3. Trace perihepatic free fluid Electronically Signed   By: Kerby Moors M.D.   On: 10/28/2021 09:41    I have reviewed both CT and ultrasound. Extensive peripancreatic inflammatory changes.  Pancreas enhances though.  Bile duct is not dilated.  No gallstones.  Assessment;  Acute pancreatitis in a 76 year old Caucasian male who also has mildly elevated transaminases AST greater than ALT.  No prior history of pancreatitis.  Ultrasound is negative for cholelithiasis.  Patient drinks 2-3 drinks of wine every day.  Etiology most likely alcohol induced illness he had single stone which she has passed. Patient is at risk for developing pseudocyst. Patient is hemodynamically stable.  I agree with aggressive hydration with IV fluids as recommended by Dr. Cyndia Skeeters.  Will need to monitor electrolytes and renal function closely.  Elevated transaminases.  AST is greater than ALT.  Differential diagnosis includes fatty liver or biliary pancreatitis despite negative imaging studies.  This pattern can also be seen with alcoholic hepatitis.  History of colonic adenomas.  Last colonoscopy was in December 2015.  Records reveal that he was sent letter by Dr. Trinda Pascal in December 2020 but he did not respond.   Recommendations;  N.p.o. except ice chips. Will monitor renal function closely. Will monitor LFTs. Outpatient surveillance colonoscopy when he is fully recovered from this acute illness.   LOS: 0 days      10/28/2021, 11:27 AM

## 2021-10-28 NOTE — Assessment & Plan Note (Addendum)
Rate controlled on Cardizem 240 daily,at home additionally on 120 mg in evening. Heparin transitioned to Eliquis 11/06/19.

## 2021-10-28 NOTE — Assessment & Plan Note (Addendum)
repleted. Recent Labs  Lab 11/02/21 0309 11/03/21 0554 11/04/21 0318 11/05/21 0351 11/06/21 0444  K 3.2* 3.3* 3.2* 3.3* 3.8

## 2021-10-28 NOTE — Assessment & Plan Note (Addendum)
CT chest 2/7-new finding of peripancreatic edema suggestive of necrotizing infection or fistula-started on Unasyn 2/7-CT pancreas 2/9-signs of severe pancreatitis also some locules of gas adjacent to the duodenum-as well as other findings see the report, antibiotic changed to meropenem 2/9- and had CT of the abd w/ po contrast-no obvious perforation or source of gas from at this time. Now tolerating diet-low fat, clinically improving although hiccups bothering him.  Continue OOB, PTOT.Pain control. He had worsening leukocytosis and discussed with GI, ID and with Dr. Dwaine Gale from IR 2/11-no fluid to aspirate for culture. WBC now improving-will complete 7 days of antibiotics.

## 2021-10-28 NOTE — Assessment & Plan Note (Addendum)
BP is controlled. Continue to hold losartan continue Cardizem.

## 2021-10-28 NOTE — ED Notes (Signed)
Patient transported to CT 

## 2021-10-29 ENCOUNTER — Inpatient Hospital Stay (HOSPITAL_COMMUNITY): Payer: PPO

## 2021-10-29 DIAGNOSIS — R1013 Epigastric pain: Secondary | ICD-10-CM

## 2021-10-29 DIAGNOSIS — K859 Acute pancreatitis without necrosis or infection, unspecified: Secondary | ICD-10-CM

## 2021-10-29 DIAGNOSIS — N179 Acute kidney failure, unspecified: Secondary | ICD-10-CM

## 2021-10-29 LAB — APTT
aPTT: 66 seconds — ABNORMAL HIGH (ref 24–36)
aPTT: 53 seconds — ABNORMAL HIGH (ref 24–36)

## 2021-10-29 LAB — CBC
HCT: 53.3 % — ABNORMAL HIGH (ref 39.0–52.0)
Hemoglobin: 17.2 g/dL — ABNORMAL HIGH (ref 13.0–17.0)
MCH: 31.3 pg (ref 26.0–34.0)
MCHC: 32.3 g/dL (ref 30.0–36.0)
MCV: 96.9 fL (ref 80.0–100.0)
Platelets: 296 10*3/uL (ref 150–400)
RBC: 5.5 MIL/uL (ref 4.22–5.81)
RDW: 12.9 % (ref 11.5–15.5)
WBC: 11.4 10*3/uL — ABNORMAL HIGH (ref 4.0–10.5)
nRBC: 0 % (ref 0.0–0.2)

## 2021-10-29 LAB — COMPREHENSIVE METABOLIC PANEL
ALT: 52 U/L — ABNORMAL HIGH (ref 0–44)
AST: 39 U/L (ref 15–41)
Albumin: 3 g/dL — ABNORMAL LOW (ref 3.5–5.0)
Alkaline Phosphatase: 62 U/L (ref 38–126)
Anion gap: 8 (ref 5–15)
BUN: 38 mg/dL — ABNORMAL HIGH (ref 8–23)
CO2: 20 mmol/L — ABNORMAL LOW (ref 22–32)
Calcium: 7.4 mg/dL — ABNORMAL LOW (ref 8.9–10.3)
Chloride: 111 mmol/L (ref 98–111)
Creatinine, Ser: 1.77 mg/dL — ABNORMAL HIGH (ref 0.61–1.24)
GFR, Estimated: 40 mL/min — ABNORMAL LOW (ref >60)
Glucose, Bld: 161 mg/dL — ABNORMAL HIGH (ref 70–99)
Potassium: 4.8 mmol/L (ref 3.5–5.1)
Sodium: 139 mmol/L (ref 135–145)
Total Bilirubin: 0.9 mg/dL (ref 0.3–1.2)
Total Protein: 5.9 g/dL — ABNORMAL LOW (ref 6.5–8.1)

## 2021-10-29 LAB — LIPASE, BLOOD: Lipase: 551 U/L — ABNORMAL HIGH (ref 11–51)

## 2021-10-29 LAB — URIC ACID: Uric Acid, Serum: 6.3 mg/dL (ref 3.7–8.6)

## 2021-10-29 LAB — LACTIC ACID, PLASMA
Lactic Acid, Venous: 2.6 mmol/L (ref 0.5–1.9)
Lactic Acid, Venous: 3.2 mmol/L (ref 0.5–1.9)

## 2021-10-29 LAB — CREATININE, URINE, RANDOM: Creatinine, Urine: 393.5 mg/dL

## 2021-10-29 LAB — SODIUM, URINE, RANDOM: Sodium, Ur: 19 mmol/L

## 2021-10-29 LAB — HEPARIN LEVEL (UNFRACTIONATED): Heparin Unfractionated: <0.1 IU/mL — ABNORMAL LOW (ref 0.30–0.70)

## 2021-10-29 LAB — AMMONIA: Ammonia: 25 umol/L (ref 9–35)

## 2021-10-29 LAB — BRAIN NATRIURETIC PEPTIDE: B Natriuretic Peptide: 316 pg/mL — ABNORMAL HIGH (ref 0.0–100.0)

## 2021-10-29 LAB — PHOSPHORUS: Phosphorus: 3.6 mg/dL (ref 2.5–4.6)

## 2021-10-29 LAB — MAGNESIUM: Magnesium: 2.3 mg/dL (ref 1.7–2.4)

## 2021-10-29 MED ORDER — TAMSULOSIN HCL 0.4 MG PO CAPS
0.8000 mg | ORAL_CAPSULE | Freq: Every evening | ORAL | Status: DC
  Administered 2021-10-29 – 2021-11-11 (×13): 0.8 mg via ORAL
  Filled 2021-10-29 (×13): qty 2

## 2021-10-29 MED ORDER — METOPROLOL TARTRATE 5 MG/5ML IV SOLN
5.0000 mg | Freq: Four times a day (QID) | INTRAVENOUS | Status: DC
  Administered 2021-10-29: 5 mg via INTRAVENOUS
  Filled 2021-10-29: qty 5

## 2021-10-29 MED ORDER — DILTIAZEM HCL ER COATED BEADS 240 MG PO CP24
240.0000 mg | ORAL_CAPSULE | Freq: Every day | ORAL | Status: DC
  Administered 2021-10-29 – 2021-10-30 (×2): 240 mg via ORAL
  Filled 2021-10-29 (×2): qty 1

## 2021-10-29 NOTE — Progress Notes (Signed)
ANTICOAGULATION CONSULT NOTE -   Pharmacy Consult for Heparin Indication: atrial fibrillation  No Known Allergies  Patient Measurements: Height: 5\' 11"  (180.3 cm) Weight: 117.6 kg (259 lb 3.2 oz) IBW/kg (Calculated) : 75.3 HEPARIN DW (KG): 101.2   Vital Signs: Temp: 97.4 F (36.3 C) (02/06 1233) BP: 119/76 (02/06 1233) Pulse Rate: 56 (02/06 1233)  Labs: Recent Labs    10/27/21 2312 10/28/21 0136 10/28/21 0419 10/28/21 1102 10/28/21 1102 10/28/21 1854 10/29/21 0527 10/29/21 0807 10/29/21 0829 10/29/21 1712  HGB 17.0  --  17.6*  --   --   --  17.2*  --   --   --   HCT 49.7  --  54.0*  --   --   --  53.3*  --   --   --   PLT 324  --  339  --   --   --  296  --   --   --   APTT  --   --   --  30   < > 66*  --  66*  --  53*  HEPARINUNFRC  --   --   --  0.77*  --   --   --   --  <0.10*  --   CREATININE 0.97  --  0.95  --   --   --  1.77*  --   --   --   CKTOTAL  --   --  71  --   --   --   --   --   --   --   TROPONINIHS 4 3  --   --   --   --   --   --   --   --    < > = values in this interval not displayed.     Estimated Creatinine Clearance: 48.5 mL/min (A) (by C-G formula based on SCr of 1.77 mg/dL (H)).   Medical History: Past Medical History:  Diagnosis Date   Atrial fibrillation ([place]) SEP 2015   Essential hypertension    GERD (gastroesophageal reflux disease)    Helicobacter pylori gastritis Dec 2015   Treated with Pylera   Sleep apnea     Medications:  Medications Prior to Admission  Medication Sig Dispense Refill Last Dose   apixaban (ELIQUIS) 5 MG TABS tablet TAKE ONE TABLET (5MG  TOTAL) BY MOUTH TWOTIMES DAILY (Patient taking differently: Take 5 mg by mouth 2 (two) times daily.) 60 tablet 5 10/27/2021 at 0800   brimonidine (ALPHAGAN) 0.2 % ophthalmic solution Place 1 drop into the right eye 2 (two) times daily.   10/27/2021   diltiazem (CARDIZEM CD) 120 MG 24 hr capsule TAKE TWO CAPSULES BY MOUTH IN THE MORNING AND ONE CAPSULE IN THE EVENING 270 capsule  3 10/27/2021   losartan (COZAAR) 50 MG tablet TAKE ONE (1) TABLET BY MOUTH EVERY DAY 90 tablet 3 10/27/2021   Omega-3 Fatty Acids (FISH OIL) 1000 MG CAPS Take 1 capsule by mouth daily.   10/27/2021   pantoprazole (PROTONIX) 40 MG tablet Take 1 tablet (40 mg total) by mouth daily. (Patient taking differently: Take 40 mg by mouth at bedtime.) 30 tablet 11 Past Week   tamsulosin (FLOMAX) 0.4 MG CAPS capsule Take 0.4 mg by mouth every evening.   Past Week   traMADol (ULTRAM) 50 MG tablet Take 1 tablet (50 mg total) by mouth every 12 (twelve) hours as needed. (Patient not taking: Reported on 10/28/2021) 28 tablet 0 Not  Taking    Assessment: Patient admitted with acute pancreatitis. He is chronically anticoagulated with eliquis for afib. Last dose was 2/4 at 0800. Holding eliquis in case he needs procedure and transitioning to heparin. Baseline labs ordered, will follow APTT until correlates with HL  APTT 53, subtherapeutic  Goal of Therapy:  Heparin level 0.3-0.7 units/ml aPTT 66-102 seconds Monitor platelets by anticoagulation protocol: Yes   Plan:  Increase heparin infusion at 1750 units/hr Check APTT in ~8 hours and daily while on heparin Continue to monitor H&H and platelets  Thomasenia Sales, PharmD, [facility] Clinical Pharmacist  10/29/2021,8:10 PM

## 2021-10-29 NOTE — Progress Notes (Signed)
Had to rework the line on patient's home CPAP unit.  Sats dropped so put in O2 adapter on patient's CPAP line and have 5L running through now.  O2 sat after installation went from 88 to 92%.  Will continue to monitor.  RN aware.

## 2021-10-29 NOTE — Progress Notes (Addendum)
Subjective: Patient short of breath on entering room, which he states is baseline when exerting himself. HR 130s-140s, nurse aware. 2 liters nasal cannula applied, 02 sats then checked at 90. Increased to 3 liters nasal cannula. Patient calmed. No chest pain. Pain lower abdomen when standing today. Now resolved. No nausea. Wife and daughter at bedside.   Objective: Vital signs in last 24 hours: Temp:  [97.2 F (36.2 C)-98 F (36.7 C)] 97.9 F (36.6 C) (02/06 0640) Pulse Rate:  [72-97] 97 (02/06 0640) Resp:  [18-20] 20 (02/06 0640) BP: (110-129)/(69-87) 110/69 (02/06 0640) SpO2:  [90 %-91 %] 90 % (02/06 0640) Last BM Date: 10/27/21 General:   Alert and oriented, acutely ill-appearing Head:  Normocephalic and atraumatic. Abdomen:  Bowel sounds present, distended but non-tense, RUQ TTP Extremities:  Without edema. Neurologic:  Alert and  oriented x4 Psych:  Alert and cooperative. Normal mood and affect.  Intake/Output from previous day: 02/05 0701 - 02/06 0700 In: 1957.3 [I.V.:910; IV Piggyback:1047.3] Out: 975 [Urine:975] Intake/Output this shift: No intake/output data recorded.  Lab Results: Recent Labs    10/27/21 2312 10/28/21 0419 10/29/21 0527  WBC 15.7* 12.1* 11.4*  HGB 17.0 17.6* 17.2*  HCT 49.7 54.0* 53.3*  PLT 324 339 296   BMET Recent Labs    10/27/21 2312 10/28/21 0419 10/29/21 0527  NA 137 138 139  K 3.4* 3.7 4.8  CL 105 108 111  CO2 20* 20* 20*  GLUCOSE 161* 176* 161*  BUN 20 21 38*  CREATININE 0.97 0.95 1.77*  CALCIUM 8.9 8.7* 7.4*   LFT Recent Labs    10/27/21 2312 10/28/21 0419 10/29/21 0527  PROT 7.0 6.7 5.9*  ALBUMIN 4.0 3.8 3.0*  AST 171* 136* 39  ALT 83* 100* 52*  ALKPHOS 96 89 62  BILITOT 1.1 1.6* 0.9     Studies/Results: DG Chest Port 1 View  Result Date: 10/29/2021 CLINICAL DATA:  Abdominal pain and shortness of breath, pancreatitis EXAM: PORTABLE CHEST 1 VIEW COMPARISON:  Portable exam 0914 hours compared to  10/27/2021 FINDINGS: Normal heart size, mediastinal contours, and pulmonary vascularity. Bibasilar atelectasis. Remaining lungs clear. No definite pleural effusion or pneumothorax. IMPRESSION: Bibasilar atelectasis. Electronically Signed   By: Lavonia Dana M.D.   On: 10/29/2021 09:23   DG Chest Port 1 View  Result Date: 10/27/2021 CLINICAL DATA:  Epigastric pain. EXAM: PORTABLE CHEST 1 VIEW COMPARISON:  08/17/2009 FINDINGS: Shallow inspiration with atelectasis in the lung bases. Heart size and pulmonary vascularity are normal. No airspace disease or consolidation. Mediastinal contours appear intact. IMPRESSION: Shallow inspiration with atelectasis in the bases. Electronically Signed   By: Lucienne Capers M.D.   On: 10/27/2021 23:34   DG Abd Portable 1V  Result Date: 10/28/2021 CLINICAL DATA:  Abdominal pain. EXAM: PORTABLE ABDOMEN - 1 VIEW COMPARISON:  CTA chest abdomen pelvis 7 hours ago. FINDINGS: No gaseous bowel dilatation to suggest obstruction. Contrast excretion noted both kidneys with accumulation of contrast in the bladder compatible with CT performed earlier today. Visualized bony anatomy shows no acute finding. IMPRESSION: Negative. Electronically Signed   By: Misty Stanley M.D.   On: 10/28/2021 08:57   CT Angio Chest/Abd/Pel for Dissection W and/or Wo Contrast  Result Date: 10/28/2021 CLINICAL DATA:  Dyspnea, epigastric abdominal pain EXAM: CT ANGIOGRAPHY CHEST, ABDOMEN AND PELVIS TECHNIQUE: Non-contrast CT of the chest was initially obtained. Multidetector CT imaging through the chest, abdomen and pelvis was performed using the standard protocol during bolus administration of  intravenous contrast. Multiplanar reconstructed images and MIPs were obtained and reviewed to evaluate the vascular anatomy. RADIATION DOSE REDUCTION: This exam was performed according to the departmental dose-optimization program which includes automated exposure control, adjustment of the mA and/or kV according to  patient size and/or use of iterative reconstruction technique. CONTRAST:  183mL OMNIPAQUE IOHEXOL 350 MG/ML SOLN COMPARISON:  None. FINDINGS: CTA CHEST FINDINGS Cardiovascular: The thoracic aorta is normal in course and caliber; no intramural hematoma, dissection, or aneurysm. Minimal atherosclerotic calcification. No significant coronary artery calcification. Global cardiac size within normal limits. No pericardial effusion. Central pulmonary arteries are of normal caliber. Mediastinum/Nodes: Visualized thyroid is unremarkable. No pathologic thoracic adenopathy. The esophagus is fluid-filled, likely reflecting the sequela of gastroesophageal reflux given gastric distension. Lungs/Pleura: Mild bilateral dependent atelectasis. The lungs are otherwise clear. No pneumothorax or pleural effusion. Musculoskeletal: No acute bone abnormality Review of the MIP images confirms the above findings. CTA ABDOMEN AND PELVIS FINDINGS VASCULAR Aorta: Normal caliber. No aneurysm or dissection. Mild atherosclerotic calcification. No periaortic inflammatory change. Celiac: Unremarkable SMA: Replaced right hepatic artery.  Otherwise unremarkable. Renals: Dual renal arteries are noted bilaterally. Atherosclerotic calcification results in less than 50% stenoses of the dominant renal arteries at their origins bilaterally. Normal vascular morphology. No aneurysm or dissection. IMA: Widely patent. Inflow: Widely patent. No aneurysm or dissection. Internal iliac arteries are patent bilaterally. Veins: No obvious venous abnormality within the limitations of this arterial phase study. Review of the MIP images confirms the above findings. NON-VASCULAR Hepatobiliary: No focal liver abnormality is seen. No gallstones, gallbladder wall thickening, or biliary dilatation. Pancreas: There is extensive peripancreatic inflammatory stranding with inflammatory fluid tracking into the mesenteric root and into the right anterior pararenal space. Normal  enhancement of the pancreatic parenchyma. No loculated intrapancreatic or peripancreatic fluid collections. The pancreatic duct is not dilated. Altogether, the findings are in keeping with changes of acute interstitial/edematous pancreatitis. Spleen: Unremarkable. Adrenals/Urinary Tract: Adrenal glands are unremarkable. Kidneys are normal, without renal calculi, focal lesion, or hydronephrosis. Bladder is unremarkable. Stomach/Bowel: The stomach is distended and fluid filled suggesting changes of gastric outlet obstruction. There is hyperemia of the duodenum as well as extensive periduodenal inflammatory stranding likely related to the adjacent inflammatory process involving the pancreas. The small bowel is unremarkable. There is extensive distal transverse, descending, and sigmoid colonic diverticulosis as well as ascending colonic diverticulosis. No superimposed acute inflammatory change. Appendix normal. No free intraperitoneal gas or fluid. Lymphatic: No pathologic adenopathy. Reproductive: Prostate is unremarkable. Other: Small bilateral fat containing inguinal hernias and umbilical hernia are identified. Musculoskeletal: Degenerative changes are seen within the lumbar spine. No lytic or blastic bone lesion. No acute bone abnormality. Review of the MIP images confirms the above findings. IMPRESSION: No evidence of aortic dissection or aneurysm. Mild atherosclerotic calcification. No evidence of hemodynamically significant stenosis involving the visceral arterial vasculature or lower extremity arterial inflow. Findings in keeping with acute interstitial/edematous pancreatitis. No evidence of pancreatic or peripancreatic necrosis at this time. Duodenal hyperemia likely related to the adjacent inflammatory process. Fluid distension of the stomach likely related to gastric outlet obstruction secondary to the adjacent inflammatory process. Fluid within the distal esophagus in keeping with gastroesophageal reflux.  Aortic Atherosclerosis (ICD10-I70.0). Electronically Signed   By: Fidela Salisbury M.D.   On: 10/28/2021 01:31   US Abdomen Limited RUQ (LIVER/GB)  Result Date: 10/28/2021 CLINICAL DATA:  Acute pancreatitis. EXAM: ULTRASOUND ABDOMEN LIMITED RIGHT UPPER QUADRANT COMPARISON:  CT 10/28/2021 FINDINGS: Gallbladder: No gallstones or wall thickening visualized.  No sonographic Murphy sign noted by sonographer. Common bile duct: Diameter: 5.5 mm.  No intrahepatic bile duct dilatation. Liver: The liver has a heterogeneous echotexture and slightly irregular contour. No focal liver abnormality identified. Trace perihepatic ascites portal vein is patent on color Doppler imaging with normal direction of blood flow towards the liver. Other: None. IMPRESSION: 1. No gallstones or signs of acute cholecystitis. 2. Echogenic liver compatible with hepatic steatosis 3. Trace perihepatic free fluid Electronically Signed   By: Kerby Moors M.D.   On: 10/28/2021 09:41    Assessment: 76 year old male admitted with pancreatitis felt most likely secondary to alcohol; unable to rule out microlithiasis. Found to have newly onset elevated transaminases with AST greater than ALT, likely multifactorial in setting of alcohol use, acute illness, fatty  liver. US abdomen without gallstones. CTA with acute interstitial/edematous pancreatitis.   Lipase improved from admission. Worsening renal function noted. Hemoconcentrated. Shortness of breath this morning with exertion but denying chest pain; CXR reviewed with atelectasis. Tachycardia noted as well, in setting of known afib and pancreatitis.   Needs aggressive IV fluid resuscitation. Would also recommend ECHO at discretion of hospitalist.    Plan: May have clear liquids Increase IV fluids to 175 ml/hour Supportive measures Consider outpatient dedicated imaging of pancreas again once over acute illness Will continue to follow with you  Annitta Needs, PhD, ANP-BC [facility]  Gastroenterology    LOS: 1 day    10/29/2021, 11:32 AM

## 2021-10-29 NOTE — Progress Notes (Addendum)
ANTICOAGULATION CONSULT NOTE -   Pharmacy Consult for Heparin Indication: atrial fibrillation  No Known Allergies  Patient Measurements: Height: 5\' 11"  (180.3 cm) Weight: 117.6 kg (259 lb 3.2 oz) IBW/kg (Calculated) : 75.3 HEPARIN DW (KG): 101.2   Vital Signs: Temp: 97.9 F (36.6 C) (02/06 0640) Temp Source: Oral (02/06 0640) BP: 110/69 (02/06 0640) Pulse Rate: 97 (02/06 0640)  Labs: Recent Labs    10/27/21 2312 10/28/21 0136 10/28/21 0419 10/28/21 1102 10/28/21 1854 10/29/21 0527 10/29/21 0807  HGB 17.0  --  17.6*  --   --  17.2*  --   HCT 49.7  --  54.0*  --   --  53.3*  --   PLT 324  --  339  --   --  296  --   APTT  --   --   --  30 66*  --  66*  HEPARINUNFRC  --   --   --  0.77*  --   --   --   CREATININE 0.97  --  0.95  --   --  1.77*  --   CKTOTAL  --   --  71  --   --   --   --   TROPONINIHS 4 3  --   --   --   --   --      Estimated Creatinine Clearance: 48.5 mL/min (A) (by C-G formula based on SCr of 1.77 mg/dL (H)).   Medical History: Past Medical History:  Diagnosis Date   Atrial fibrillation ([place]) SEP 2015   Essential hypertension    GERD (gastroesophageal reflux disease)    Helicobacter pylori gastritis Dec 2015   Treated with Pylera   Sleep apnea     Medications:  Medications Prior to Admission  Medication Sig Dispense Refill Last Dose   apixaban (ELIQUIS) 5 MG TABS tablet TAKE ONE TABLET (5MG  TOTAL) BY MOUTH TWOTIMES DAILY 60 tablet 5 10/27/2021 at 0800   brimonidine (ALPHAGAN) 0.2 % ophthalmic solution Place 1 drop into the right eye 2 (two) times daily.   10/27/2021   diltiazem (CARDIZEM CD) 120 MG 24 hr capsule TAKE TWO CAPSULES BY MOUTH IN THE MORNING AND ONE CAPSULE IN THE EVENING 270 capsule 3 10/27/2021   losartan (COZAAR) 50 MG tablet TAKE ONE (1) TABLET BY MOUTH EVERY DAY 90 tablet 3 10/27/2021   Omega-3 Fatty Acids (FISH OIL) 1000 MG CAPS Take 1 capsule by mouth daily.   10/27/2021   pantoprazole (PROTONIX) 40 MG tablet Take 1 tablet (40  mg total) by mouth daily. (Patient taking differently: Take 40 mg by mouth at bedtime.) 30 tablet 11 Past Week   tamsulosin (FLOMAX) 0.4 MG CAPS capsule Take 0.4 mg by mouth every evening.   Past Week   traMADol (ULTRAM) 50 MG tablet Take 1 tablet (50 mg total) by mouth every 12 (twelve) hours as needed. (Patient not taking: Reported on 10/28/2021) 28 tablet 0 Not Taking    Assessment: Patient admitted with acute pancreatitis. He is chronically anticoagulated with eliquis for afib. Last dose was 2/4 at 0800. Holding eliquis in case he needs procedure and transitioning to heparin. Baseline labs ordered, will follow APTT until correlates with HL  APTT 66, remains therapeutic, no issues with infusion. Will increase slightly since on low end of therapeutic  Goal of Therapy:  Heparin level 0.3-0.7 units/ml aPTT 66-102 seconds Monitor platelets by anticoagulation protocol: Yes   Plan:  Increase heparin infusion at 1500 units/hr Check APTT  in ~8 hours and daily while on heparin Continue to monitor H&H and platelets  Isac Sarna, BS [place], BCPS Clinical Pharmacist Pager [phone] 10/29/2021,8:33 AM

## 2021-10-29 NOTE — Progress Notes (Signed)
Set up patient's home cpap unit for patient and have it ready at bedside for patient.  Patient stated that MD said he could come off of 3L to go on home CPAP tonight, patient able to place on his own.

## 2021-10-29 NOTE — Assessment & Plan Note (Addendum)
Multifactorial in the setting of severe pancreatitis, morbid obesity OSA, bilateral basilar consolidation or atelectasis.Has had CT chest 2/7 along with echo with normal EF Continue CPAP bedtime, and wean nasal cannula as tolerated.  PT OT and ambulation.

## 2021-10-29 NOTE — Assessment & Plan Note (Addendum)
Resolved

## 2021-10-29 NOTE — Progress Notes (Signed)
°  Transition of Care [facility]) Screening Note   Patient Details  Name: Sean Golden Date of Birth: 04-Aug-1946   Transition of Care [facility]) CM/SW Contact:    Ihor Gully, LCSW Phone Number: 10/29/2021, 5:02 PM    Transition of Care Department [facility]) has reviewed patient and no TOC needs have been identified at this time. We will continue to monitor patient advancement through interdisciplinary progression rounds. If new patient transition needs arise, please place a TOC consult.

## 2021-10-29 NOTE — Plan of Care (Signed)
°  Problem: Education: Goal: Knowledge of General Education information will improve Description: Including pain rating scale, medication(s)/side effects and non-pharmacologic comfort measures Outcome: Progressing   Problem: Health Behavior/Discharge Planning: Goal: Ability to manage health-related needs will improve Outcome: Progressing   Problem: Clinical Measurements: Goal: Ability to maintain clinical measurements within normal limits will improve Outcome: Progressing Goal: Will remain free from infection Outcome: Progressing Goal: Diagnostic test results will improve Outcome: Progressing Goal: Respiratory complications will improve Outcome: Progressing Goal: Cardiovascular complication will be avoided Outcome: Progressing   Problem: Activity: Goal: Risk for activity intolerance will decrease Outcome: Not Progressing   Problem: Nutrition: Goal: Adequate nutrition will be maintained Outcome: Progressing   Problem: Coping: Goal: Level of anxiety will decrease Outcome: Progressing   Problem: Elimination: Goal: Will not experience complications related to bowel motility Outcome: Progressing Goal: Will not experience complications related to urinary retention Outcome: Progressing   Problem: Pain Managment: Goal: General experience of comfort will improve Outcome: Not Progressing   Problem: Safety: Goal: Ability to remain free from injury will improve Outcome: Progressing   Problem: Skin Integrity: Goal: Risk for impaired skin integrity will decrease Outcome: Progressing

## 2021-10-29 NOTE — Progress Notes (Signed)
PROGRESS NOTE  Sean Golden EVO:[id number] DOB: 09-28-1945   PCP: Celene Squibb, MD  Patient is from: Home.  Lives with his wife.  Independently ambulates at baseline.  DOA: 10/27/2021 LOS: 1  Chief complaints:  Chief Complaint  Patient presents with   Abdominal Pain     Brief Narrative / Interim history: 76 year old M with PMH of A-fib on Eliquis, HTN, GERD/erosive esophagitis, H. Pylori and OSA presenting with acute onset cramping epigastric abdominal pain radiating to his right abdomen after he had a dinner, and admitted for acute pancreatitis.  Lipase elevated to 1680.  Mild LFT elevation with pattern consistent with EtOH or rhabdo but CK within normal.  Patient admits to drinking 2 to 3 glasses of wine before bedtime.  CTA C/A/P concerning for acute pancreatitis without necrosis or pseudocyst.  CT also showed fluid distention of the stomach and esophagus concerning for GOO or an GERD.  Started on IV fluid, IV analgesics and antiemetics, and admitted.  GI consulted.    RUQ [place] negative for gallstone or signs of acute cholecystitis but hepatic steatosis.   Subjective: Seen and examined earlier this morning and later this afternoon.  Continues to endorse abdominal pain.  Rates his pain 3/10 at rest.  Sometimes, pain days to 8/10 when he moves.  Pain is mainly suprapubic.  Did not have too much pain when he got up to sit on the edge of the bed for exam.  Denies nausea or vomiting.  Minimal urine output.  Dark urine.  Patient's mother and daughter at bedside  Objective: Vitals:   10/28/21 1432 10/28/21 2103 10/29/21 0640 10/29/21 1233  BP: 117/87 129/79 110/69 119/76  Pulse: 72 96 97 (!) 56  Resp: 18 20 20  (!) 22  Temp: 98 F (36.7 C) (!) 97.2 F (36.2 C) 97.9 F (36.6 C) (!) 97.4 F (36.3 C)  TempSrc: Oral Oral Oral   SpO2: 90% 91% 90% 94%  Weight:      Height:        Examination:  GENERAL: No apparent distress.  Nontoxic. HEENT: MMM.  Vision and hearing grossly intact.   NECK: Supple.  No apparent JVD but difficult exam due to body habitus.  RESP: 91% on RA.  No IWOB.  Fine crackles over lower lung fields. CVS:  RRR. Heart sounds normal.  ABD/GI/GU: BS+. Abd soft.  Diffuse tenderness.  No rebound or guarding. MSK/EXT:  Moves extremities. No apparent deformity. No edema.  SKIN: no apparent skin lesion or wound NEURO: Awake and alert. Oriented appropriately.  No apparent focal neuro deficit. PSYCH: Calm. Normal affect.   Procedures:  None  Microbiology summarized: EXHBZ-16 and influenza PCR nonreactive.  Assessment and Plan: * Acute pancreatitis- (present on admission) CT consistent with acute pancreatitis without necrosis or pseudocyst.  Lipase elevated at 1680. Suspect alcoholic pancreatitis.  Drinks 2-3 glasses of wine a day.  Slight AST/ALT elevation with a pattern consistent with alcohol.  ALP within normal arguing against obstructive etiology.  RUQ [place] negative.  Total bili slightly up.  Patient is also on losartan which is class Ib because of acute pancreatitis.  Continues to endorse abdominal pain that is worse with movement. Lactic acid slightly elevated but low suspicion for peritonitis.  Lipase downtrending. -I agree with increasing IV fluid to 175 cc an hour. -Start clear liquid diet. -Continue IV Protonix twice daily -Continue IV Dilaudid for pain control. -Continue antiemetics -IV heparin for A-fib. -Incentive spirometry/OOB/PT/OT  Transaminitis with hyperbilirubinemia- (present on admission) Pattern  consistent with alcohol or rhabdo but CK within normal.  Doubt obstructive etiology with normal ALP.  RUQ ultrasound negative.  Improved. -Continue trending  AKI (acute kidney injury) ([place]) Likely prerenal from poor p.o. intake and dehydration in the setting of AKI.  He also had contrast with CT abdomen and pelvis.  Renal US without acute finding. -Increased IV fluid as above -Strict intake and output -Bladder scan every 6 hours -Increased  home Flomax dose 0.8 mg.  Lactic acidosis Likely from dehydration versus ischemic bowel.  No rebound or guarding. -Increase IV fluid as above -Recheck  Shortness of breath CXR consistent from atelectasis.  BNP slightly elevated but doubt CHF exacerbation.  -Encourage incentive telemetry/OOB/PT/OT  Bandemia- (present on admission) Likely demargination versus infection.  Improved. -Monitor  Polycythemia Likely secondary from hemoconcentration and sleep apnea -Continue IV fluid hydration.  Gastroesophageal reflux disease- (present on admission) EGD in 05/2004 with erosive esophagitis.  Fluid within distal esophagus suggesting GERD.  He reports heartburn - IV Protonix 40 mg twice daily -Elevate head of the bed  Hypokalemia- (present on admission) Resolved. -Monitor  Atrial fibrillation ([place])- (present on admission) Had an episode of mild RVR overnight. -Resume home Cardizem. -IV heparin for anticoagulation -Optimize Mg and K  Essential hypertension- (present on admission) Normotensive -Continue holding losartan. -Resume home Cardizem.  Class II obesity Encourage lifestyle change to lose weight.  Mixed hyperlipidemia- (present on admission) - Hold statin  Sleep apnea- (present on admission) -Nightly CPAP         Body mass index is 36.15 kg/m.         DVT prophylaxis:  SCDs Start: 10/28/21 0322  Code Status: DNR/DNI Family Communication: Updated patient's wife and daughter at bedside. Level of care: Telemetry Status is: Inpatient Remains inpatient appropriate because: Due to acute pancreatitis, AKI     Final disposition: Likely home once acute pancreatitis resolves.      Consultants:  Gastroenterology   Sch Meds:  Scheduled Meds:  brimonidine  1 drop Right Eye BID   diltiazem  240 mg Oral Daily   pantoprazole (PROTONIX) IV  40 mg Intravenous Q12H   tamsulosin  0.8 mg Oral QPM   Continuous Infusions:  sodium chloride 175 mL/hr at  10/29/21 1307   heparin 1,500 Units/hr (10/29/21 0905)   PRN Meds:.acetaminophen **OR** acetaminophen, HYDROmorphone (DILAUDID) injection, ondansetron **OR** ondansetron (ZOFRAN) IV  Antimicrobials: Anti-infectives (From admission, onward)    None        I have personally reviewed the following labs and images: CBC: Recent Labs  Lab 10/27/21 2312 10/28/21 0419 10/29/21 0527  WBC 15.7* 12.1* 11.4*  NEUTROABS  --  11.0*  --   HGB 17.0 17.6* 17.2*  HCT 49.7 54.0* 53.3*  MCV 95.0 95.7 96.9  PLT 324 339 296   BMP &GFR Recent Labs  Lab 10/27/21 2312 10/28/21 0419 10/29/21 0527  NA 137 138 139  K 3.4* 3.7 4.8  CL 105 108 111  CO2 20* 20* 20*  GLUCOSE 161* 176* 161*  BUN 20 21 38*  CREATININE 0.97 0.95 1.77*  CALCIUM 8.9 8.7* 7.4*  MG  --  1.7 2.3  PHOS  --   --  3.6   Estimated Creatinine Clearance: 48.5 mL/min (A) (by C-G formula based on SCr of 1.77 mg/dL (H)). Liver & Pancreas: Recent Labs  Lab 10/27/21 2312 10/28/21 0419 10/29/21 0527  AST 171* 136* 39  ALT 83* 100* 52*  ALKPHOS 96 89 62  BILITOT 1.1 1.6* 0.9  PROT  7.0 6.7 5.9*  ALBUMIN 4.0 3.8 3.0*   Recent Labs  Lab 10/27/21 2312 10/28/21 0419 10/29/21 0527  LIPASE 1,681* 1,678* 551*   Recent Labs  Lab 10/29/21 0527  AMMONIA 25   Diabetic: No results for input(s): HGBA1C in the last 72 hours. No results for input(s): GLUCAP in the last 168 hours. Cardiac Enzymes: Recent Labs  Lab 10/28/21 0419  CKTOTAL 71   No results for input(s): PROBNP in the last 8760 hours. Coagulation Profile: No results for input(s): INR, PROTIME in the last 168 hours. Thyroid Function Tests: No results for input(s): TSH, T4TOTAL, FREET4, T3FREE, THYROIDAB in the last 72 hours. Lipid Profile: Recent Labs    10/28/21 0418  CHOL 199  HDL 41  LDLCALC 141*  TRIG 85  CHOLHDL 4.9   Anemia Panel: No results for input(s): VITAMINB12, FOLATE, FERRITIN, TIBC, IRON, RETICCTPCT in the last 72 hours. Urine  analysis:    Component Value Date/Time   COLORURINE YELLOW 05/22/2021 1707   APPEARANCEUR HAZY (A) 05/22/2021 1707   LABSPEC 1.017 05/22/2021 1707   PHURINE 5.0 05/22/2021 1707   GLUCOSEU NEGATIVE 05/22/2021 1707   HGBUR NEGATIVE 05/22/2021 1707   BILIRUBINUR NEGATIVE 05/22/2021 1707   KETONESUR NEGATIVE 05/22/2021 1707   PROTEINUR NEGATIVE 05/22/2021 1707   NITRITE NEGATIVE 05/22/2021 1707   LEUKOCYTESUR NEGATIVE 05/22/2021 1707   Sepsis Labs: Invalid input(s): PROCALCITONIN, [place]  Microbiology: Recent Results (from the past 240 hour(s))  Resp Panel by RT-PCR (Flu A&B, Covid) Nasopharyngeal Swab     Status: None   Collection Time: 10/28/21  3:00 AM   Specimen: Nasopharyngeal Swab; Nasopharyngeal(NP) swabs in vial transport medium  Result Value Ref Range Status   SARS Coronavirus 2 by RT PCR NEGATIVE NEGATIVE Final    Comment: (NOTE) SARS-CoV-2 target nucleic acids are NOT DETECTED.  The SARS-CoV-2 RNA is generally detectable in upper respiratory specimens during the acute phase of infection. The lowest concentration of SARS-CoV-2 viral copies this assay can detect is 138 copies/mL. A negative result does not preclude SARS-Cov-2 infection and should not be used as the sole basis for treatment or other patient management decisions. A negative result may occur with  improper specimen collection/handling, submission of specimen other than nasopharyngeal swab, presence of viral mutation(s) within the areas targeted by this assay, and inadequate number of viral copies(<138 copies/mL). A negative result must be combined with clinical observations, patient history, and epidemiological information. The expected result is Negative.  Fact Sheet for Patients:  EntrepreneurPulse.com.au  Fact Sheet for Healthcare Providers:  IncredibleEmployment.be  This test is no t yet approved or cleared by the [place] FDA and  has been authorized  for detection and/or diagnosis of SARS-CoV-2 by FDA under an Emergency Use Authorization (EUA). This EUA will remain  in effect (meaning this test can be used) for the duration of the COVID-19 declaration under Section 564(b)(1) of the Act, 21 U.S.C.section 360bbb-3(b)(1), unless the authorization is terminated  or revoked sooner.       Influenza A by PCR NEGATIVE NEGATIVE Final   Influenza B by PCR NEGATIVE NEGATIVE Final    Comment: (NOTE) The Xpert Xpress SARS-CoV-2/FLU/RSV plus assay is intended as an aid in the diagnosis of influenza from Nasopharyngeal swab specimens and should not be used as a sole basis for treatment. Nasal washings and aspirates are unacceptable for Xpert Xpress SARS-CoV-2/FLU/RSV testing.  Fact Sheet for Patients: EntrepreneurPulse.com.au  Fact Sheet for Healthcare Providers: IncredibleEmployment.be  This test is not yet approved or cleared  by the [place] and has been authorized for detection and/or diagnosis of SARS-CoV-2 by FDA under an Emergency Use Authorization (EUA). This EUA will remain in effect (meaning this test can be used) for the duration of the COVID-19 declaration under Section 564(b)(1) of the Act, 21 U.S.C. section 360bbb-3(b)(1), unless the authorization is terminated or revoked.  Performed at [facility], [address], [place], [place] [address]     Radiology Studies: US RENAL  Result Date: 10/29/2021 CLINICAL DATA:  Acute kidney injury EXAM: RENAL / URINARY TRACT ULTRASOUND COMPLETE COMPARISON:  CT October 28, 2021 FINDINGS: Right Kidney: Renal measurements: 11.8 x 6.1 x 5.5 cm = volume: 207 mL. Echogenicity within normal limits. No mass or hydronephrosis visualized. Left Kidney: Renal measurements: 12.7 x 6.6 x 6.3 cm = volume: 275 mL. Echogenicity within normal limits. No mass or hydronephrosis visualized. Bladder: Not visualized. Other: None. IMPRESSION: No hydronephrosis.  Electronically Signed   By: Dahlia Bailiff M.D.   On: 10/29/2021 11:46   DG Chest Port 1 View  Result Date: 10/29/2021 CLINICAL DATA:  Abdominal pain and shortness of breath, pancreatitis EXAM: PORTABLE CHEST 1 VIEW COMPARISON:  Portable exam 0914 hours compared to 10/27/2021 FINDINGS: Normal heart size, mediastinal contours, and pulmonary vascularity. Bibasilar atelectasis. Remaining lungs clear. No definite pleural effusion or pneumothorax. IMPRESSION: Bibasilar atelectasis. Electronically Signed   By: Lavonia Dana M.D.   On: 10/29/2021 09:23       T. [place]  If 7PM-7AM, please contact night-coverage [email or web address] 10/29/2021, 5:42 PM

## 2021-10-30 ENCOUNTER — Inpatient Hospital Stay (HOSPITAL_COMMUNITY): Payer: PPO

## 2021-10-30 ENCOUNTER — Encounter (HOSPITAL_COMMUNITY): Payer: Self-pay | Admitting: Family Medicine

## 2021-10-30 DIAGNOSIS — J9601 Acute respiratory failure with hypoxia: Secondary | ICD-10-CM

## 2021-10-30 DIAGNOSIS — K852 Alcohol induced acute pancreatitis without necrosis or infection: Secondary | ICD-10-CM

## 2021-10-30 DIAGNOSIS — E872 Acidosis, unspecified: Secondary | ICD-10-CM

## 2021-10-30 LAB — BASIC METABOLIC PANEL
Anion gap: 10 (ref 5–15)
BUN: 43 mg/dL — ABNORMAL HIGH (ref 8–23)
CO2: 17 mmol/L — ABNORMAL LOW (ref 22–32)
Calcium: 6.9 mg/dL — ABNORMAL LOW (ref 8.9–10.3)
Chloride: 110 mmol/L (ref 98–111)
Creatinine, Ser: 1.45 mg/dL — ABNORMAL HIGH (ref 0.61–1.24)
GFR, Estimated: 51 mL/min — ABNORMAL LOW (ref >60)
Glucose, Bld: 137 mg/dL — ABNORMAL HIGH (ref 70–99)
Potassium: 3.9 mmol/L (ref 3.5–5.1)
Sodium: 137 mmol/L (ref 135–145)

## 2021-10-30 LAB — BRAIN NATRIURETIC PEPTIDE
B Natriuretic Peptide: 80 pg/mL (ref 0.0–100.0)
B Natriuretic Peptide: 47 pg/mL (ref 0.0–100.0)

## 2021-10-30 LAB — COMPREHENSIVE METABOLIC PANEL
ALT: 30 U/L (ref 0–44)
AST: 24 U/L (ref 15–41)
Albumin: 2.6 g/dL — ABNORMAL LOW (ref 3.5–5.0)
Alkaline Phosphatase: 49 U/L (ref 38–126)
Anion gap: 8 (ref 5–15)
BUN: 42 mg/dL — ABNORMAL HIGH (ref 8–23)
CO2: 17 mmol/L — ABNORMAL LOW (ref 22–32)
Calcium: 6.9 mg/dL — ABNORMAL LOW (ref 8.9–10.3)
Chloride: 110 mmol/L (ref 98–111)
Creatinine, Ser: 1.33 mg/dL — ABNORMAL HIGH (ref 0.61–1.24)
GFR, Estimated: 56 mL/min — ABNORMAL LOW (ref >60)
Glucose, Bld: 141 mg/dL — ABNORMAL HIGH (ref 70–99)
Potassium: 4 mmol/L (ref 3.5–5.1)
Sodium: 135 mmol/L (ref 135–145)
Total Bilirubin: 0.6 mg/dL (ref 0.3–1.2)
Total Protein: 5.4 g/dL — ABNORMAL LOW (ref 6.5–8.1)

## 2021-10-30 LAB — LACTIC ACID, PLASMA
Lactic Acid, Venous: 2.3 mmol/L (ref 0.5–1.9)
Lactic Acid, Venous: 2.9 mmol/L (ref 0.5–1.9)

## 2021-10-30 LAB — CBC
HCT: 44.7 % (ref 39.0–52.0)
Hemoglobin: 14.2 g/dL (ref 13.0–17.0)
MCH: 31.2 pg (ref 26.0–34.0)
MCHC: 31.8 g/dL (ref 30.0–36.0)
MCV: 98.2 fL (ref 80.0–100.0)
Platelets: 246 10*3/uL (ref 150–400)
RBC: 4.55 MIL/uL (ref 4.22–5.81)
RDW: 13.2 % (ref 11.5–15.5)
WBC: 8.5 10*3/uL (ref 4.0–10.5)
nRBC: 0 % (ref 0.0–0.2)

## 2021-10-30 LAB — MAGNESIUM
Magnesium: 2.1 mg/dL (ref 1.7–2.4)
Magnesium: 2.2 mg/dL (ref 1.7–2.4)

## 2021-10-30 LAB — HEPARIN LEVEL (UNFRACTIONATED): Heparin Unfractionated: >1.1 IU/mL — ABNORMAL HIGH (ref 0.30–0.70)

## 2021-10-30 LAB — PHOSPHORUS: Phosphorus: 2.8 mg/dL (ref 2.5–4.6)

## 2021-10-30 LAB — LIPASE, BLOOD: Lipase: 144 U/L — ABNORMAL HIGH (ref 11–51)

## 2021-10-30 LAB — APTT
aPTT: 63 seconds — ABNORMAL HIGH (ref 24–36)
aPTT: 65 seconds — ABNORMAL HIGH (ref 24–36)

## 2021-10-30 LAB — TROPONIN I (HIGH SENSITIVITY): Troponin I (High Sensitivity): 5 ng/L (ref <18)

## 2021-10-30 LAB — CK: Total CK: 78 U/L (ref 49–397)

## 2021-10-30 LAB — AMMONIA: Ammonia: 32 umol/L (ref 9–35)

## 2021-10-30 MED ORDER — POLYETHYLENE GLYCOL 3350 17 G PO PACK
17.0000 g | PACK | Freq: Two times a day (BID) | ORAL | Status: DC
  Administered 2021-10-30 – 2021-11-01 (×3): 17 g via ORAL
  Filled 2021-10-30 (×14): qty 1

## 2021-10-30 MED ORDER — HEPARIN (PORCINE) 25000 UT/250ML-% IV SOLN
INTRAVENOUS | Status: AC
  Filled 2021-10-30: qty 250

## 2021-10-30 MED ORDER — CHLORHEXIDINE GLUCONATE CLOTH 2 % EX PADS
6.0000 | MEDICATED_PAD | Freq: Every day | CUTANEOUS | Status: DC
  Administered 2021-10-30 – 2021-11-12 (×14): 6 via TOPICAL

## 2021-10-30 MED ORDER — LORAZEPAM 2 MG/ML IJ SOLN
1.0000 mg | Freq: Once | INTRAMUSCULAR | Status: AC
  Administered 2021-10-30: 1 mg via INTRAVENOUS
  Filled 2021-10-30: qty 1

## 2021-10-30 MED ORDER — FUROSEMIDE 10 MG/ML IJ SOLN
60.0000 mg | Freq: Once | INTRAMUSCULAR | Status: AC
  Administered 2021-10-30: 60 mg via INTRAVENOUS
  Filled 2021-10-30: qty 6

## 2021-10-30 MED ORDER — LEVALBUTEROL HCL 0.63 MG/3ML IN NEBU
0.6300 mg | INHALATION_SOLUTION | Freq: Once | RESPIRATORY_TRACT | Status: AC
  Administered 2021-10-30: 0.63 mg via RESPIRATORY_TRACT
  Filled 2021-10-30: qty 3

## 2021-10-30 MED ORDER — LEVALBUTEROL HCL 0.63 MG/3ML IN NEBU
INHALATION_SOLUTION | RESPIRATORY_TRACT | Status: AC
  Administered 2021-10-30: 0.63 mg via RESPIRATORY_TRACT
  Filled 2021-10-30: qty 3

## 2021-10-30 MED ORDER — SODIUM CHLORIDE 0.9 % IV SOLN
3.0000 g | Freq: Four times a day (QID) | INTRAVENOUS | Status: DC
  Administered 2021-10-31 – 2021-11-01 (×8): 3 g via INTRAVENOUS
  Filled 2021-10-30 (×13): qty 8

## 2021-10-30 MED ORDER — LEVALBUTEROL HCL 0.63 MG/3ML IN NEBU
0.6300 mg | INHALATION_SOLUTION | Freq: Four times a day (QID) | RESPIRATORY_TRACT | Status: DC | PRN
  Administered 2021-11-03: 0.63 mg via RESPIRATORY_TRACT
  Filled 2021-10-30: qty 3

## 2021-10-30 MED ORDER — FUROSEMIDE 10 MG/ML IJ SOLN
40.0000 mg | Freq: Once | INTRAMUSCULAR | Status: AC
  Administered 2021-10-30: 40 mg via INTRAVENOUS
  Filled 2021-10-30: qty 4

## 2021-10-30 MED ORDER — SODIUM BICARBONATE 650 MG PO TABS
650.0000 mg | ORAL_TABLET | Freq: Three times a day (TID) | ORAL | Status: DC
  Administered 2021-10-30 (×3): 650 mg via ORAL
  Filled 2021-10-30 (×3): qty 1

## 2021-10-30 NOTE — Evaluation (Signed)
Occupational Therapy Evaluation Patient Details Name: Sean Golden MRN: [id number] DOB: 1946-02-22 Today's Date: 10/30/2021   History of Present Illness Sean Golden is a 76 y.o. male with medical history significant of with history of afib, essential hypertension, GERD, H. pylori, sleep apnea, and more presents the ED with a chief complaint of stomach pain.  Patient reports he had dinner and at 6:30 PM he had acute onset of severe, cramping epigastric pain.  After some time the pain started radiating to the right and down slightly to the level of the umbilicus.  Patient reports he has had nausea and dry heaves but no vomiting.  He has not had any loose stools.  His last normal bowel movement was the previous day.  Patient has never had pancreatitis before.  Patient has never had gallbladder concerns before.  He does not have any fevers.  He did have some hypoxia in the ER from breathing shallow because it hurts to take a deep breath, but has been maintaining his oxygen sats with 2 L nasal cannula.  He denies any melena, hematochezia, dysuria, hematuria.  Patient denies any trauma to his abdomen, change in medication, trips to the [facility], familial lipid disorders, but he does drink.  He drinks about 2 glasses of wine per evening.  Patient has no other complaints at this time.   Clinical Impression   Pt agreeable to OT evaluation. Pt on 10 L supplemental O2 during session. Pt noted to desaturate to 89% SpO2 after attempting to don sock but this returned with rest and pursed lip breathing. Pt able to stay above 90% remainder of session. Pt is primarily limited by balance and endurance deficits. B UE strength is good. Pt requires rest breaks after dressing and transfer tasks. Pt is able to complete mobility with supervision while leaning on IV pole, furniture and wall. Pt left in chair with call bell within reach. Pt will benefit from continued OT in the hospital and recommended venue below to increase  strength, balance, and endurance for safe ADL's.        Recommendations for follow up therapy are one component of a multi-disciplinary discharge planning process, led by the attending physician.  Recommendations may be updated based on patient status, additional functional criteria and insurance authorization.   Follow Up Recommendations  Home health OT    Assistance Recommended at Discharge Intermittent Supervision/Assistance  Patient can return home with the following A lot of help with walking and/or transfers;Help with stairs or ramp for entrance;A little help with bathing/dressing/bathroom    Functional Status Assessment  Patient has had a recent decline in their functional status and demonstrates the ability to make significant improvements in function in a reasonable and predictable amount of time.  Equipment Recommendations  None recommended by OT    Recommendations for Other Services       Precautions / Restrictions Precautions Precautions: Fall Restrictions Weight Bearing Restrictions: No      Mobility Bed Mobility Overal bed mobility: Modified Independent             General bed mobility comments: Mild labored movement with pain noted in supine via grimace.    Transfers Overall transfer level: Needs assistance Equipment used: Rolling walker (2 wheels) Transfers: Sit to/from Stand, Bed to chair/wheelchair/BSC Sit to Stand: Supervision     Step pivot transfers: Supervision     General transfer comment: Pt noted to lean on wall and use IV pole for balance when ambulating to the toilet.  Balance Overall balance assessment: Needs assistance Sitting-balance support: No upper extremity supported, Feet supported Sitting balance-Leahy Scale: Good Sitting balance - Comments: EOB   Standing balance support: Single extremity supported, During functional activity Standing balance-Leahy Scale: Fair Standing balance comment: Leaning on IV pole and  reaching for rail and wall to steady balance.                           ADL either performed or assessed with clinical judgement   ADL Overall ADL's : Needs assistance/impaired                 Upper Body Dressing : Modified independent;Sitting   Lower Body Dressing: Modified independent;Sitting/lateral leans Lower Body Dressing Details (indicate cue type and reason): Doffing and donning R sock with extended time and labored movement. Toilet Transfer: Supervision/safety;Ambulation Toilet Transfer Details (indicate cue type and reason): Pt able to transfer to toilet and back to chair holding onto IV pole and leaning on wall with supervision assist. Toileting- Clothing Manipulation and Hygiene: Modified independent;Sitting/lateral lean Toileting - Clothing Manipulation Details (indicate cue type and reason): Pt completed peri-care seated on the toilet with lateral leans     Functional mobility during ADLs: Supervision/safety;Rolling walker (2 wheels) General ADL Comments: Pt demosntrates labored movement followed by fatigue, but at a supervision level of assist.     Vision Baseline Vision/History: 1 Wears glasses (reading) Ability to See in Adequate Light: 0 Adequate Patient Visual Report: No change from baseline Vision Assessment?: No apparent visual deficits                Pertinent Vitals/Pain Pain Assessment Pain Assessment: 0-10 Pain Score: 4  Pain Location: sstomach and back Pain Descriptors / Indicators: Sore Pain Intervention(s): Limited activity within patient's tolerance, Monitored during session, Repositioned     Hand Dominance Right   Extremity/Trunk Assessment Upper Extremity Assessment Upper Extremity Assessment: Overall WFL for tasks assessed   Lower Extremity Assessment Lower Extremity Assessment: Defer to PT evaluation   Cervical / Trunk Assessment Cervical / Trunk Assessment: Normal   Communication Communication Communication: No  difficulties   Cognition Arousal/Alertness: Awake/alert Behavior During Therapy: WFL for tasks assessed/performed Overall Cognitive Status: Within Functional Limits for tasks assessed                                                        Home Living Family/patient expects to be discharged to:: Private residence Living Arrangements: Spouse/significant other Available Help at Discharge: Family;Available PRN/intermittently ([place] wife works but pt reported he would be able to secure 24/7 assist for a week or two if needed.) Type of Home: House Home Access: Stairs to enter [employer] of Steps: 1 Entrance Stairs-Rails: None Home Layout: Two level;Laundry or work area in basement Alternate Therapist, sports of Steps: 12 Alternate Level Stairs-Rails: Right (going down) Bathroom Shower/Tub: Occupational psychologist: Standard Bathroom Accessibility: Yes How Accessible: Accessible via walker Home Equipment: Conservation officer, nature (2 wheels);Cane - single point;Wheelchair - manual          Prior Functioning/Environment Prior Level of Function : Independent/Modified Independent             Mobility Comments: 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 ADLs Comments: Pt reports independence for ADL's and IADL's.  Pt drives.        OT Problem List: Decreased activity tolerance;Impaired balance (sitting and/or standing)      OT Treatment/Interventions: Self-care/ADL training;Therapeutic exercise;Therapeutic activities;Patient/family education;Balance training;Energy conservation    OT Goals(Current goals can be found in the care plan section) Acute Rehab OT Goals Patient Stated Goal: return home OT Goal Formulation: With patient Time For Goal Achievement: 11/13/21 Potential to Achieve Goals: Good  OT Frequency: Min 1X/week                                   End of Session Equipment Utilized During Treatment: Rolling  walker (2 wheels);Oxygen (10 L supplemental O2)  Activity Tolerance: Patient tolerated treatment well Patient left: in chair;with call bell/phone within reach;with family/visitor present  OT Visit Diagnosis: Unsteadiness on feet (R26.81);Other abnormalities of gait and mobility (R26.89)                Time: 2244-9753 OT Time Calculation (min): 33 min Charges:  OT General Charges $OT Visit: 1 Visit OT Evaluation $OT Eval Low Complexity: 1 Low OT Treatments $Self Care/Home Management : 8-22 mins    OT, MOT  Larey Seat 10/30/2021, 2:04 PM

## 2021-10-30 NOTE — Progress Notes (Signed)
Multiple attempts to give report.

## 2021-10-30 NOTE — Progress Notes (Addendum)
PROGRESS NOTE  Sean Golden CBS:[id number] DOB: 1945-10-20   PCP: Celene Squibb, MD  Patient is from: Home.  Lives with his wife.  Independently ambulates at baseline.  DOA: 10/27/2021 LOS: 2  Chief complaints:  Chief Complaint  Patient presents with   Abdominal Pain     Brief Narrative / Interim history: 76 year old M with PMH of A-fib on Eliquis, HTN, GERD/erosive esophagitis, H. Pylori and OSA presenting with acute onset cramping epigastric abdominal pain radiating to his right abdomen after he had a dinner, and admitted for acute pancreatitis.  Lipase elevated to 1680.  Mild LFT elevation with pattern consistent with EtOH or rhabdo but CK within normal.  Patient admits to drinking 2 to 3 glasses of wine before bedtime.  CTA C/A/P concerning for acute pancreatitis without necrosis or pseudocyst.  CT also showed fluid distention of the stomach and esophagus concerning for GOO or an GERD.  Started on IV fluid, IV analgesics and antiemetics, and admitted.  GI consulted.    RUQ [place] negative for gallstone or signs of acute cholecystitis but hepatic steatosis.  Pancreatitis improving clinically and chemically.  However, patient developed respiratory distress with hypoxia requiring supplemental oxygen likely from atelectasis and IV fluid.  IV fluid discontinued.  Started on IV Lasix.    Subjective: Seen and examined earlier this morning and earlier this afternoon.  Patient had an episode of respiratory distress last night and earlier this afternoon.  He denies shortness of breath although he seems to have some work of breathing.  Abdominal pain improved.  He rates his pain 2-3 on a scale of 10.  He denies nausea or vomiting.  Passing gas.  No bowel movement yet.  Denies chest pain.  Patient's daughters at bedside.  Objective: Vitals:   10/30/21 0508 10/30/21 0509 10/30/21 1205 10/30/21 1209  BP:  123/67 100/68   Pulse:  95 91   Resp:  20    Temp:  98.6 F (37 C) 98.3 F (36.8 C)    TempSrc:   Oral   SpO2:  95% 92% 93%  Weight: 122.6 kg     Height:        Examination:  GENERAL: No apparent distress.  Nontoxic. HEENT: MMM.  Vision and hearing grossly intact.  NECK: Supple.  No apparent JVD.  RESP: 93% on 8 L by HFNC.  No IWOB.  Bibasilar fine crackles. CVS:  RRR. Heart sounds normal.  ABD/GI/GU: BS+.  Slightly distended abdomen.  No significant tenderness. MSK/EXT:  Moves extremities. No apparent deformity.  Trace BLE edema. SKIN: no apparent skin lesion or wound NEURO: Awake and alert. Oriented appropriately.  No apparent focal neuro deficit. PSYCH: Calm. Normal affect.   Procedures:  None  Microbiology summarized: [id number] and influenza PCR nonreactive.  Assessment and Plan: * Acute pancreatitis- (present on admission) CT consistent with acute pancreatitis without necrosis or pseudocyst. Suspect alcoholic pancreatitis versus gallstone pancreatitis.  Drinks 2-3 glasses of wine a day.  Improved. -Discontinued IV fluids due to respiratory distress -Continue IV Protonix twice daily -Continue IV Dilaudid for pain control. -Continue antiemetics.  Start MiraLAX twice daily -IV heparin for A-fib. -Incentive spirometry/OOB/PT/OT -Advanced to full liquid diet by GI  Transaminitis with hyperbilirubinemia- (present on admission) Pattern consistent with alcohol or rhabdo but CK within normal.  Resolved.  AKI (acute kidney injury) ([place]) Likely prerenal from poor p.o. intake and dehydration in the setting of AKI.  He also had contrast with CT abdomen and pelvis.  Renal US without  acute finding.  AKI improving. -Recheck renal function in the morning -Increased home Flomax dose 0.8 mg.  Lactic acidosis Likely from dehydration versus ischemic bowel.  No rebound or guarding. -Recheck  Acute respiratory failure with hypoxia (HCC) CXR consistent from atelectasis and small bilateral effusion.  BNP slightly elevated but improved.  Still can be falsely low given body  habitus. -Discontinued IV fluid -Redose IV Lasix 60 mg x 1 -Strict intake and output -Encouraged incentive spirometry/OOB/PT/OT -Wean oxygen as able -Check echocardiogram  Bandemia- (present on admission) Likely demargination versus infection.  Resolved.  Polycythemia Likely secondary from hemoconcentration and sleep apnea.  Resolved.   Gastroesophageal reflux disease- (present on admission) EGD in 05/2004 with erosive esophagitis.  Fluid within distal esophagus suggesting GERD.  He reports heartburn - IV Protonix 40 mg twice daily -Elevate head of the bed  Hypokalemia- (present on admission) Resolved. -Monitor  Atrial fibrillation ([place])- (present on admission) Rate controlled. -Continue home Cardizem -IV heparin for anticoagulation -Optimize Mg and K  Essential hypertension- (present on admission) Normotensive -Continue holding losartan. -Resume home Cardizem.  Metabolic acidosis Likely from renal failure and IV fluid.  He also have mild lactic acidosis. -P.o. sodium bicarbonate  Class II obesity Encourage lifestyle change to lose weight.  Mixed hyperlipidemia- (present on admission) - Hold statin  Sleep apnea- (present on admission) -Nightly CPAP         Body mass index is 37.7 kg/m.         DVT prophylaxis:  SCDs Start: 10/28/21 0322  Code Status: DNR/DNI Family Communication: Updated patient's daughters at bedside. Level of care: Telemetry Status is: Inpatient Remains inpatient appropriate because: Due to acute pancreatitis, AKI and acute respiratory failure with hypoxia     Final disposition: Likely home once medically stable      Consultants:  Gastroenterology   Sch Meds:  Scheduled Meds:  brimonidine  1 drop Right Eye BID   diltiazem  240 mg Oral Daily   pantoprazole (PROTONIX) IV  40 mg Intravenous Q12H   polyethylene glycol  17 g Oral BID   sodium bicarbonate  650 mg Oral TID   tamsulosin  0.8 mg Oral QPM   Continuous  Infusions:  heparin     heparin 1,900 Units/hr (10/30/21 1244)   PRN Meds:.acetaminophen **OR** acetaminophen, HYDROmorphone (DILAUDID) injection, levalbuterol, ondansetron **OR** ondansetron (ZOFRAN) IV  Antimicrobials: Anti-infectives (From admission, onward)    None        I have personally reviewed the following labs and images: CBC: Recent Labs  Lab 10/27/21 2312 10/28/21 0419 10/29/21 0527 10/30/21 0602  WBC 15.7* 12.1* 11.4* 8.5  NEUTROABS  --  11.0*  --   --   HGB 17.0 17.6* 17.2* 14.2  HCT 49.7 54.0* 53.3* 44.7  MCV 95.0 95.7 96.9 98.2  PLT 324 339 296 246   BMP &GFR Recent Labs  Lab 10/27/21 2312 10/28/21 0419 10/29/21 0527 10/30/21 0111 10/30/21 0602  NA 137 138 139 137 135  K 3.4* 3.7 4.8 3.9 4.0  CL 105 108 111 110 110  CO2 20* 20* 20* 17* 17*  GLUCOSE 161* 176* 161* 137* 141*  BUN 20 21 38* 43* 42*  CREATININE 0.97 0.95 1.77* 1.45* 1.33*  CALCIUM 8.9 8.7* 7.4* 6.9* 6.9*  MG  --  1.7 2.3 2.1 2.2  PHOS  --   --  3.6  --  2.8   Estimated Creatinine Clearance: 65.9 mL/min (A) (by C-G formula based on SCr of 1.33 mg/dL (H)). Liver &  Pancreas: Recent Labs  Lab 10/27/21 2312 10/28/21 0419 10/29/21 0527 10/30/21 0602  AST 171* 136* 39 24  ALT 83* 100* 52* 30  ALKPHOS 96 89 62 49  BILITOT 1.1 1.6* 0.9 0.6  PROT 7.0 6.7 5.9* 5.4*  ALBUMIN 4.0 3.8 3.0* 2.6*   Recent Labs  Lab 10/27/21 2312 10/28/21 0419 10/29/21 0527 10/30/21 0602  LIPASE 1,681* 1,678* 551* 144*   Recent Labs  Lab 10/29/21 0527 10/30/21 0602  AMMONIA 25 32   Diabetic: No results for input(s): HGBA1C in the last 72 hours. No results for input(s): GLUCAP in the last 168 hours. Cardiac Enzymes: Recent Labs  Lab 10/28/21 0419 10/30/21 0602  CKTOTAL 71 78   No results for input(s): PROBNP in the last 8760 hours. Coagulation Profile: No results for input(s): INR, PROTIME in the last 168 hours. Thyroid Function Tests: No results for input(s): TSH, T4TOTAL,  FREET4, T3FREE, THYROIDAB in the last 72 hours. Lipid Profile: Recent Labs    10/28/21 0418  CHOL 199  HDL 41  LDLCALC 141*  TRIG 85  CHOLHDL 4.9   Anemia Panel: No results for input(s): VITAMINB12, FOLATE, FERRITIN, TIBC, IRON, RETICCTPCT in the last 72 hours. Urine analysis:    Component Value Date/Time   COLORURINE YELLOW 05/22/2021 1707   APPEARANCEUR HAZY (A) 05/22/2021 1707   LABSPEC 1.017 05/22/2021 1707   PHURINE 5.0 05/22/2021 1707   GLUCOSEU NEGATIVE 05/22/2021 1707   HGBUR NEGATIVE 05/22/2021 1707   BILIRUBINUR NEGATIVE 05/22/2021 1707   KETONESUR NEGATIVE 05/22/2021 1707   PROTEINUR NEGATIVE 05/22/2021 1707   NITRITE NEGATIVE 05/22/2021 1707   LEUKOCYTESUR NEGATIVE 05/22/2021 1707   Sepsis Labs: Invalid input(s): PROCALCITONIN, [place]  Microbiology: Recent Results (from the past 240 hour(s))  Resp Panel by RT-PCR (Flu A&B, Covid) Nasopharyngeal Swab     Status: None   Collection Time: 10/28/21  3:00 AM   Specimen: Nasopharyngeal Swab; Nasopharyngeal(NP) swabs in vial transport medium  Result Value Ref Range Status   SARS Coronavirus 2 by RT PCR NEGATIVE NEGATIVE Final    Comment: (NOTE) SARS-CoV-2 target nucleic acids are NOT DETECTED.  The SARS-CoV-2 RNA is generally detectable in upper respiratory specimens during the acute phase of infection. The lowest concentration of SARS-CoV-2 viral copies this assay can detect is 138 copies/mL. A negative result does not preclude SARS-Cov-2 infection and should not be used as the sole basis for treatment or other patient management decisions. A negative result may occur with  improper specimen collection/handling, submission of specimen other than nasopharyngeal swab, presence of viral mutation(s) within the areas targeted by this assay, and inadequate number of viral copies(<138 copies/mL). A negative result must be combined with clinical observations, patient history, and epidemiological information. The  expected result is Negative.  Fact Sheet for Patients:  EntrepreneurPulse.com.au  Fact Sheet for Healthcare Providers:  IncredibleEmployment.be  This test is no t yet approved or cleared by the [place] FDA and  has been authorized for detection and/or diagnosis of SARS-CoV-2 by FDA under an Emergency Use Authorization (EUA). This EUA will remain  in effect (meaning this test can be used) for the duration of the COVID-19 declaration under Section 564(b)(1) of the Act, 21 U.S.C.section 360bbb-3(b)(1), unless the authorization is terminated  or revoked sooner.       Influenza A by PCR NEGATIVE NEGATIVE Final   Influenza B by PCR NEGATIVE NEGATIVE Final    Comment: (NOTE) The Xpert Xpress SARS-CoV-2/FLU/RSV plus assay is intended as an aid in the diagnosis  of influenza from Nasopharyngeal swab specimens and should not be used as a sole basis for treatment. Nasal washings and aspirates are unacceptable for Xpert Xpress SARS-CoV-2/FLU/RSV testing.  Fact Sheet for Patients: EntrepreneurPulse.com.au  Fact Sheet for Healthcare Providers: IncredibleEmployment.be  This test is not yet approved or cleared by the [place] FDA and has been authorized for detection and/or diagnosis of SARS-CoV-2 by FDA under an Emergency Use Authorization (EUA). This EUA will remain in effect (meaning this test can be used) for the duration of the COVID-19 declaration under Section 564(b)(1) of the Act, 21 U.S.C. section 360bbb-3(b)(1), unless the authorization is terminated or revoked.  Performed at [facility], [address], [place], [place] [address]     Radiology Studies: DG CHEST PORT 1 VIEW  Result Date: 10/30/2021 CLINICAL DATA:  Hypoxia EXAM: PORTABLE CHEST 1 VIEW COMPARISON:  10/29/2021 FINDINGS: Bibasilar opacities, likely atelectasis. Suspect small effusions. Heart is normal size. No acute bony abnormality.  IMPRESSION: Small bilateral effusions with bibasilar atelectasis. Electronically Signed   By: Rolm Baptise M.D.   On: 10/30/2021 01:04       T. [place]  If 7PM-7AM, please contact night-coverage [email or web address] 10/30/2021, 3:08 PM

## 2021-10-30 NOTE — Progress Notes (Signed)
ANTICOAGULATION CONSULT NOTE -   Pharmacy Consult for Heparin Indication: atrial fibrillation  No Known Allergies  Patient Measurements: Height: 5\' 11"  (180.3 cm) Weight: 122.6 kg (270 lb 4.5 oz) IBW/kg (Calculated) : 75.3 HEPARIN DW (KG): 101.2   Vital Signs: Temp: 98.6 F (37 C) (02/07 0509) Temp Source: Oral (02/06 2157) BP: 123/67 (02/07 0509) Pulse Rate: 95 (02/07 0509)  Labs: Recent Labs    10/27/21 2312 10/28/21 0136 10/28/21 0419 10/28/21 1102 10/28/21 1854 10/29/21 0527 10/29/21 0807 10/29/21 0829 10/29/21 1712 10/30/21 0111 10/30/21 0602  HGB 17.0  --  17.6*  --   --  17.2*  --   --   --   --  14.2  HCT 49.7  --  54.0*  --   --  53.3*  --   --   --   --  44.7  PLT 324  --  339  --   --  296  --   --   --   --  246  APTT  --   --   --  30   < >  --  66*  --  53*  --  63*  HEPARINUNFRC  --   --   --  0.77*  --   --   --  <0.10*  --   --  >1.10*  CREATININE 0.97  --  0.95  --   --  1.77*  --   --   --  1.45* 1.33*  CKTOTAL  --   --  71  --   --   --   --   --   --   --  78  TROPONINIHS 4 3  --   --   --   --   --   --   --  5  --    < > = values in this interval not displayed.     Estimated Creatinine Clearance: 65.9 mL/min (A) (by C-G formula based on SCr of 1.33 mg/dL (H)).   Medical History: Past Medical History:  Diagnosis Date   Atrial fibrillation ([place]) SEP 2015   Essential hypertension    GERD (gastroesophageal reflux disease)    Helicobacter pylori gastritis Dec 2015   Treated with Pylera   Sleep apnea     Medications:  Medications Prior to Admission  Medication Sig Dispense Refill Last Dose   apixaban (ELIQUIS) 5 MG TABS tablet TAKE ONE TABLET (5MG  TOTAL) BY MOUTH TWOTIMES DAILY (Patient taking differently: Take 5 mg by mouth 2 (two) times daily.) 60 tablet 5 10/27/2021 at 0800   brimonidine (ALPHAGAN) 0.2 % ophthalmic solution Place 1 drop into the right eye 2 (two) times daily.   10/27/2021   diltiazem (CARDIZEM CD) 120 MG 24 hr capsule  TAKE TWO CAPSULES BY MOUTH IN THE MORNING AND ONE CAPSULE IN THE EVENING 270 capsule 3 10/27/2021   losartan (COZAAR) 50 MG tablet TAKE ONE (1) TABLET BY MOUTH EVERY DAY 90 tablet 3 10/27/2021   Omega-3 Fatty Acids (FISH OIL) 1000 MG CAPS Take 1 capsule by mouth daily.   10/27/2021   pantoprazole (PROTONIX) 40 MG tablet Take 1 tablet (40 mg total) by mouth daily. (Patient taking differently: Take 40 mg by mouth at bedtime.) 30 tablet 11 Past Week   tamsulosin (FLOMAX) 0.4 MG CAPS capsule Take 0.4 mg by mouth every evening.   Past Week   traMADol (ULTRAM) 50 MG tablet Take 1 tablet (50 mg total) by mouth every  12 (twelve) hours as needed. (Patient not taking: Reported on 10/28/2021) 28 tablet 0 Not Taking    Assessment: Patient admitted with acute pancreatitis. He is chronically anticoagulated with eliquis for afib. Last dose was 2/4 at 0800. Holding eliquis in case he needs procedure and transitioning to heparin.   HL >1.10 aPTT 63- slightly subtherapeutic CBC WNL  Goal of Therapy:  Heparin level 0.3-0.7 units/ml aPTT 66-102 seconds Monitor platelets by anticoagulation protocol: Yes   Plan:  Increase heparin infusion at 1900 units/hr Check APTT in ~8 hours and daily while on heparin Continue to monitor H&H and platelets  Margot Ables, PharmD Clinical Pharmacist 10/30/2021 8:35 AM

## 2021-10-30 NOTE — Plan of Care (Signed)
Still with some degree of respiratory distress with work of breathing and hypoxemia requiring up to 10 L by HFNC.  However, he denies shortness of breath or chest pain.  Tachypneic to upper 20s.  Low suspicion for PE with uninterrupted anticoagulation.  Has no fever or leukocytosis but difficult to exclude pneumonia.  No significant change after IV Lasix.  Plan -Transfer to stepdown for BiPAP for work of breathing -CT chest without contrast -Check echocardiogram -Incentive spirometry, OOB/PT/OT -Wean oxygen as able -Strict intake and output

## 2021-10-30 NOTE — Progress Notes (Signed)
Pt placed on BIPAP 14/6/50% per MD order. Pt is tolerating well at this time.

## 2021-10-30 NOTE — Progress Notes (Signed)
°   10/30/21 1547  Vitals  BP 116/60  MAP (mmHg) 72  BP Location Right Arm  BP Method Automatic  Patient Position (if appropriate) Lying  Pulse Rate 68  Pulse Rate Source Dinamap  Resp (!) 28  Level of Consciousness  Level of Consciousness Alert  MEWS COLOR  MEWS Score Color Yellow  Pain Assessment  Pain Scale 0-10  Pain Score 0  MEWS Score  MEWS Temp 0  MEWS Systolic 0  MEWS Pulse 0  MEWS RR 2  MEWS LOC 0  MEWS Score 2

## 2021-10-30 NOTE — Plan of Care (Signed)
°  Problem: Acute Rehab OT Goals (only OT should resolve) Goal: Pt. Will Perform Grooming Flowsheets (Taken 10/30/2021 1407) Pt Will Perform Grooming:  Independently  standing Goal: Pt. Will Perform Lower Body Dressing Flowsheets (Taken 10/30/2021 1407) Pt Will Perform Lower Body Dressing:  Independently  sit to/from stand  sitting/lateral leans Goal: Pt. Will Transfer To Toilet Flowsheets (Taken 10/30/2021 1407) Pt Will Transfer to Toilet:  Independently  ambulating    OT, MOT

## 2021-10-30 NOTE — Progress Notes (Signed)
Placed patient on [place] HFNC at 8L and patient now has a sat of 94%.  Patient was given Xopenex treatment before placing HFNC on.  Patient still seems to be breathing hard, but patient has not stated that he is having trouble breathing.  Patient getting dose of lasix, will continue to monitor patient to see if WOB decreases after interventions.

## 2021-10-30 NOTE — Assessment & Plan Note (Addendum)
Bicarb is stable,monitor

## 2021-10-30 NOTE — Progress Notes (Signed)
Pt was assessed by nursing student with respirations of 28. This nurse came to assess pt and Respirations was 28 On 10L HFNC. PT is SOB and respirations are labored. Lungs ausculted and expiratory wheezes noted. MD notified orders placed for BIPAP and for step down.

## 2021-10-30 NOTE — Progress Notes (Signed)
Patient brought down from 300 to be placed on bipap. Upon arrival patient on nasal cannula at 8 lpm and saturations of 95%. CT had been worked on and now was functioning CT ordered and transported down on non re breather and placed on Bipap when arrived back in room.

## 2021-10-30 NOTE — Progress Notes (Signed)
ANTICOAGULATION CONSULT NOTE   Pharmacy Consult for Heparin Indication: atrial fibrillation  No Known Allergies  Patient Measurements: Height: 5\' 10"  (177.8 cm) Weight: 123.4 kg (272 lb 0.8 oz) IBW/kg (Calculated) : 73 HEPARIN DW (KG): 100.9   Vital Signs: Temp: 99.5 F (37.5 C) (02/07 1756) Temp Source: Oral (02/07 1756) BP: 116/60 (02/07 1547) Pulse Rate: 90 (02/07 1829)  Labs: Recent Labs    10/27/21 2312 10/28/21 0136 10/28/21 0419 10/28/21 1102 10/28/21 1854 10/29/21 0527 10/29/21 0807 10/29/21 0829 10/29/21 1712 10/30/21 0111 10/30/21 0602 10/30/21 1723  HGB 17.0  --  17.6*  --   --  17.2*  --   --   --   --  14.2  --   HCT 49.7  --  54.0*  --   --  53.3*  --   --   --   --  44.7  --   PLT 324  --  339  --   --  296  --   --   --   --  246  --   APTT  --   --   --  30   < >  --    < >  --  53*  --  63* 65*  HEPARINUNFRC  --   --   --  0.77*  --   --   --  <0.10*  --   --  >1.10*  --   CREATININE 0.97  --  0.95  --   --  1.77*  --   --   --  1.45* 1.33*  --   CKTOTAL  --   --  71  --   --   --   --   --   --   --  78  --   TROPONINIHS 4 3  --   --   --   --   --   --   --  5  --   --    < > = values in this interval not displayed.     Estimated Creatinine Clearance: 65.2 mL/min (A) (by C-G formula based on SCr of 1.33 mg/dL (H)).   Assessment: Patient admitted with acute pancreatitis. He is chronically anticoagulated with eliquis for afib. Last dose was 2/4 at 0800. Holding eliquis in case he needs procedure and transitioning to heparin. Eliquis will be affecting heparin levels so will utilize aPTT for monitoring until levels correlate.  aPTT 65 sec (slightly subtherapeutic) on heparin infusion at 1900 units/hr. No issues with line or bleeding reported per RN.  Goal of Therapy:  Heparin level 0.3-0.7 units/ml aPTT 66-102 seconds Monitor platelets by anticoagulation protocol: Yes   Plan:  Increase heparin infusion to 2000 units/hr Check APTT in ~8  hours  Sherlon Handing, PharmD, BCPS Please see amion for complete clinical pharmacist phone list 10/30/2021 6:59 PM

## 2021-10-30 NOTE — Progress Notes (Signed)
Subjective: Abdominal pain is improving. Still with  mild pain with moving and getting up. No pain at rest. No nausea or vomiting.Tolerating clear liquid diet well.   Chest x-ray this morning with Small bilateral effusions with bibasilar atelectasis.  Placed on HFNC at 8 L earlier this morning due to increased work of breathing and decreased O2 saturation.  He was given a dose of IV furosemide 40 mg.  Objective: Vital signs in last 24 hours: Temp:  [97.4 F (36.3 C)-98.6 F (37 C)] 98.6 F (37 C) (02/07 0509) Pulse Rate:  [56-110] 95 (02/07 0509) Resp:  [20-22] 20 (02/07 0509) BP: (119-123)/(67-77) 123/67 (02/07 0509) SpO2:  [90 %-95 %] 95 % (02/07 0509) Weight:  [122.6 kg] 122.6 kg (02/07 0508) Last BM Date: 10/27/21 General:   Alert and oriented, pleasant Head:  Normocephalic and atraumatic. Eyes:  No icterus, sclera clear. Conjuctiva pink.  Mouth:  Without lesions, mucosa pink and moist.  Neck:  Supple, without thyromegaly or masses.  Heart:  S1, S2 present, no murmurs noted.  Lungs: Clear to auscultation bilaterally, without wheezing, rales, or rhonchi.  Abdomen:  Bowel sounds present, soft, non-tender, non-distended. No HSM or hernias noted. No rebound or guarding. No masses appreciated  Msk:  Symmetrical without gross deformities. Normal posture. Pulses:  Normal pulses noted. Extremities:  Without clubbing or edema. Neurologic:  Alert and  oriented x4;  grossly normal neurologically. Skin:  Warm and dry, intact without significant lesions.  Cervical Nodes:  No significant cervical adenopathy. Psych:  Alert and cooperative. Normal mood and affect.  Intake/Output from previous day: 02/06 0701 - 02/07 0700 In: 960 [P.O.:960] Out: 500 [Urine:500] Intake/Output this shift: No intake/output data recorded.  Lab Results: Recent Labs    10/28/21 0419 10/29/21 0527 10/30/21 0602  WBC 12.1* 11.4* 8.5  HGB 17.6* 17.2* 14.2  HCT 54.0* 53.3* 44.7  PLT 339 296 246    BMET Recent Labs    10/29/21 0527 10/30/21 0111 10/30/21 0602  NA 139 137 135  K 4.8 3.9 4.0  CL 111 110 110  CO2 20* 17* 17*  GLUCOSE 161* 137* 141*  BUN 38* 43* 42*  CREATININE 1.77* 1.45* 1.33*  CALCIUM 7.4* 6.9* 6.9*   LFT Recent Labs    10/28/21 0419 10/29/21 0527 10/30/21 0602  PROT 6.7 5.9* 5.4*  ALBUMIN 3.8 3.0* 2.6*  AST 136* 39 24  ALT 100* 52* 30  ALKPHOS 89 62 49  BILITOT 1.6* 0.9 0.6    Studies/Results: US RENAL  Result Date: 10/29/2021 CLINICAL DATA:  Acute kidney injury EXAM: RENAL / URINARY TRACT ULTRASOUND COMPLETE COMPARISON:  CT October 28, 2021 FINDINGS: Right Kidney: Renal measurements: 11.8 x 6.1 x 5.5 cm = volume: 207 mL. Echogenicity within normal limits. No mass or hydronephrosis visualized. Left Kidney: Renal measurements: 12.7 x 6.6 x 6.3 cm = volume: 275 mL. Echogenicity within normal limits. No mass or hydronephrosis visualized. Bladder: Not visualized. Other: None. IMPRESSION: No hydronephrosis. Electronically Signed   By: Dahlia Bailiff M.D.   On: 10/29/2021 11:46   DG CHEST PORT 1 VIEW  Result Date: 10/30/2021 CLINICAL DATA:  Hypoxia EXAM: PORTABLE CHEST 1 VIEW COMPARISON:  10/29/2021 FINDINGS: Bibasilar opacities, likely atelectasis. Suspect small effusions. Heart is normal size. No acute bony abnormality. IMPRESSION: Small bilateral effusions with bibasilar atelectasis. Electronically Signed   By: Rolm Baptise M.D.   On: 10/30/2021 01:04   DG Chest Port 1 View  Result Date: 10/29/2021 CLINICAL DATA:  Abdominal pain  and shortness of breath, pancreatitis EXAM: PORTABLE CHEST 1 VIEW COMPARISON:  Portable exam 0914 hours compared to 10/27/2021 FINDINGS: Normal heart size, mediastinal contours, and pulmonary vascularity. Bibasilar atelectasis. Remaining lungs clear. No definite pleural effusion or pneumothorax. IMPRESSION: Bibasilar atelectasis. Electronically Signed   By: Lavonia Dana M.D.   On: 10/29/2021 09:23   DG Abd Portable 1V  Result  Date: 10/28/2021 CLINICAL DATA:  Abdominal pain. EXAM: PORTABLE ABDOMEN - 1 VIEW COMPARISON:  CTA chest abdomen pelvis 7 hours ago. FINDINGS: No gaseous bowel dilatation to suggest obstruction. Contrast excretion noted both kidneys with accumulation of contrast in the bladder compatible with CT performed earlier today. Visualized bony anatomy shows no acute finding. IMPRESSION: Negative. Electronically Signed   By: Misty Stanley M.D.   On: 10/28/2021 08:57   US Abdomen Limited RUQ (LIVER/GB)  Result Date: 10/28/2021 CLINICAL DATA:  Acute pancreatitis. EXAM: ULTRASOUND ABDOMEN LIMITED RIGHT UPPER QUADRANT COMPARISON:  CT 10/28/2021 FINDINGS: Gallbladder: No gallstones or wall thickening visualized. No sonographic Murphy sign noted by sonographer. Common bile duct: Diameter: 5.5 mm.  No intrahepatic bile duct dilatation. Liver: The liver has a heterogeneous echotexture and slightly irregular contour. No focal liver abnormality identified. Trace perihepatic ascites portal vein is patent on color Doppler imaging with normal direction of blood flow towards the liver. Other: None. IMPRESSION: 1. No gallstones or signs of acute cholecystitis. 2. Echogenic liver compatible with hepatic steatosis 3. Trace perihepatic free fluid Electronically Signed   By: Kerby Moors M.D.   On: 10/28/2021 09:41    Assessment: 76 year old male admitted with pancreatitis felt most likely secondary to alcohol; unable to rule out microlithiasis in the setting of new onset elevated transaminases with AST greater than ALT, but suspect this was more likely related to alcohol use, acute illness, fatty liver.  LFTs have now normalized.  Ultrasound abdomen without gallstones.  CTA with acute interstitial/edematous pancreatitis.  Clinically, patient is improving. Abdominal pain improving. No nausea or vomiting, tolerating clear liquid diet well. Yesterday, he was noted to be hemoconcentrated with elevated H/H and worsening Cr. Today, H/H have  returned to normal and Cr has improved to 1.33 form 1.77. He has had some increased work of breathing over the last 48 hours, on 8L HFNC this morning, and found to have small bilateral plural effusions on CXR. BNP wnl. Given a dose of lasix. At this point, we will decrease his IV fluids and try advancing his diet slowly.   Plan: Advance to full liquids today.  Advance further as tolerated.  Decrease IV fluids to 50 cc/h. Continue supportive measures. Alcohol abstinence.  Consider outpatient dedicated imaging of pancreas again once over acute illness Will continue to follow with you   LOS: 2 days    10/30/2021, 7:59 AM   Aliene Altes, [facility]

## 2021-10-31 ENCOUNTER — Inpatient Hospital Stay (HOSPITAL_COMMUNITY): Payer: PPO

## 2021-10-31 DIAGNOSIS — R0609 Other forms of dyspnea: Secondary | ICD-10-CM | POA: Diagnosis not present

## 2021-10-31 LAB — CBC
HCT: 40.7 % (ref 39.0–52.0)
Hemoglobin: 13.5 g/dL (ref 13.0–17.0)
MCH: 32.3 pg (ref 26.0–34.0)
MCHC: 33.2 g/dL (ref 30.0–36.0)
MCV: 97.4 fL (ref 80.0–100.0)
Platelets: 220 10*3/uL (ref 150–400)
RBC: 4.18 MIL/uL — ABNORMAL LOW (ref 4.22–5.81)
RDW: 13.2 % (ref 11.5–15.5)
WBC: 8.6 10*3/uL (ref 4.0–10.5)
nRBC: 0 % (ref 0.0–0.2)

## 2021-10-31 LAB — APTT
aPTT: 61 seconds — ABNORMAL HIGH (ref 24–36)
aPTT: 59 seconds — ABNORMAL HIGH (ref 24–36)

## 2021-10-31 LAB — COMPREHENSIVE METABOLIC PANEL
ALT: 24 U/L (ref 0–44)
AST: 19 U/L (ref 15–41)
Albumin: 2.4 g/dL — ABNORMAL LOW (ref 3.5–5.0)
Alkaline Phosphatase: 47 U/L (ref 38–126)
Anion gap: 7 (ref 5–15)
BUN: 37 mg/dL — ABNORMAL HIGH (ref 8–23)
CO2: 18 mmol/L — ABNORMAL LOW (ref 22–32)
Calcium: 7.4 mg/dL — ABNORMAL LOW (ref 8.9–10.3)
Chloride: 108 mmol/L (ref 98–111)
Creatinine, Ser: 1.09 mg/dL (ref 0.61–1.24)
GFR, Estimated: >60 mL/min (ref >60)
Glucose, Bld: 135 mg/dL — ABNORMAL HIGH (ref 70–99)
Potassium: 3.8 mmol/L (ref 3.5–5.1)
Sodium: 133 mmol/L — ABNORMAL LOW (ref 135–145)
Total Bilirubin: 1 mg/dL (ref 0.3–1.2)
Total Protein: 5.3 g/dL — ABNORMAL LOW (ref 6.5–8.1)

## 2021-10-31 LAB — ECHOCARDIOGRAM COMPLETE
AR max vel: 2.72 cm2
AV Area VTI: 2.73 cm2
AV Area mean vel: 2.67 cm2
AV Mean grad: 4.5 mmHg
AV Peak grad: 10.3 mmHg
Ao pk vel: 1.61 m/s
Area-P 1/2: 3.17 cm2
Height: 70 in
MV VTI: 3.48 cm2
S' Lateral: 2.1 cm
Weight: 4370.4 oz

## 2021-10-31 LAB — HEPARIN LEVEL (UNFRACTIONATED): Heparin Unfractionated: 0.82 IU/mL — ABNORMAL HIGH (ref 0.30–0.70)

## 2021-10-31 LAB — LIPASE, BLOOD: Lipase: 47 U/L (ref 11–51)

## 2021-10-31 LAB — LACTIC ACID, PLASMA: Lactic Acid, Venous: 1.6 mmol/L (ref 0.5–1.9)

## 2021-10-31 LAB — MRSA NEXT GEN BY PCR, NASAL: MRSA by PCR Next Gen: NOT DETECTED

## 2021-10-31 MED ORDER — LACTATED RINGERS IV SOLN: INTRAVENOUS | Status: DC

## 2021-10-31 MED ORDER — HALOPERIDOL LACTATE 5 MG/ML IJ SOLN
5.0000 mg | Freq: Once | INTRAMUSCULAR | Status: AC
  Administered 2021-10-31: 5 mg via INTRAVENOUS
  Filled 2021-10-31: qty 1

## 2021-10-31 MED ORDER — METOPROLOL TARTRATE 5 MG/5ML IV SOLN
5.0000 mg | Freq: Four times a day (QID) | INTRAVENOUS | Status: DC
  Administered 2021-10-31 – 2021-11-01 (×5): 5 mg via INTRAVENOUS
  Filled 2021-10-31 (×5): qty 5

## 2021-10-31 MED ORDER — HALOPERIDOL LACTATE 5 MG/ML IJ SOLN
2.0000 mg | Freq: Four times a day (QID) | INTRAMUSCULAR | Status: DC | PRN
  Administered 2021-11-03: 2 mg via INTRAVENOUS
  Filled 2021-10-31: qty 1

## 2021-10-31 MED ORDER — DIPHENHYDRAMINE HCL 50 MG/ML IJ SOLN
25.0000 mg | Freq: Once | INTRAMUSCULAR | Status: AC
  Administered 2021-10-31: 25 mg via INTRAVENOUS
  Filled 2021-10-31: qty 1

## 2021-10-31 NOTE — Progress Notes (Signed)
PROGRESS NOTE Sean Golden  ZOX:[id number] DOB: 08/21/1946 DOA: 10/27/2021 PCP: Celene Squibb, MD   Brief Narrative/Hospital Course: Sean Golden, 76 y.o. male 76 year old M with PMH of A-fib on Eliquis, HTN, GERD/erosive esophagitis, H. Pylori and OSA presenting with acute onset cramping epigastric abdominal pain radiating to his right abdomen after he had a dinner, and admitted for acute pancreatitis.  Lipase elevated to 1680.  Mild LFT elevation with pattern consistent with EtOH or rhabdo but CK within normal.  Patient admits to drinking 2 to 3 glasses of wine before bedtime.  CTA C/A/P concerning for acute pancreatitis without necrosis or pseudocyst.  CT also showed fluid distention of the stomach and esophagus concerning for GOO or an GERD.  Started on IV fluid, IV analgesics and antiemetics, and admitted.  GI consulted.    RUQ [place] negative for gallstone or signs of acute cholecystitis but hepatic steatosis.  Pancreatitis improving clinically and chemically.  However, patient developed respiratory distress with hypoxia requiring supplemental oxygen likely from atelectasis and IV fluid.  IV fluid discontinued.  Started on IV Lasix. CT chest 2/7-New development of bilateral basilar consolidation or atelectasis in the lungs, New finding of peripancreatic gas suggesting necrotizing infection or fistula.    Subjective: Seen examined this am Patient was transferred to stepdown last night Wife and daughter at bedside RN reports overnight agitated s/p haldol and benadryl, ativan to help with bipap Tmax 99.5, heart rate increased to 107, RR in 20s, saturating well 10 l HFNC Labs this morning showed-normal creatinine and lactate He is aao, c/o abdomen bloating-passing Gas, had BM yesterday per family. No nausea or vomiting or abdomen pain. Reexamined at 3:20 pm w/ GI at bedside  Assessment and Plan: * Acute necrotizing pancreatitis- (present on admission) CT chest 2/7-shows new finding of  peripancreatic edema suggestive of necrotizing infection or fistula-started on Unasyn 2/7.Patient does not have significant abdominal complaint except for bloating, having bowel movement. Lfts, lipase stable.GI following, discussed this morning -await further plan - may need  CT abdomen pelvis.  Continue IV PPI twice daily, can continue antibiotics incentive spirometry PTOT OOB. On FLD diet now.  Some coughing issues, will keep on IV fluids gently.  Urine output adequate without retention  Acute respiratory failure with hypoxia (HCC) - Continue aggressive volume resuscitation with severe pancreatitis and imaging including chest x-ray and CT chest 2/7 bilateral basilar consolidation or atelectasis. Off IV fluids, patient received IV Lasix with doses.  BNP slightly elevated.  On BiPAP bedtime and HFNC.  He was mentating well this morning.Echo shows EF 60 to 65%, no RWMA, moderate LVH indeterminate diastolic parameters, RV systolic function normal.  Patient remains on anticoagulation and low suspicion of VTE.  Monitor intake output Daily weight.  Continue supplemental oxygen/HFNC, Lasix.  High risk of respiratory decompensation, continue in ICU with/stepdown. Net IO Since Admission: 470.7 mL [10/31/21 1414]  Filed Weights   10/30/21 0508 10/30/21 1756 10/31/21 0500  Weight: 122.6 kg 123.4 kg 811.9 kg     Metabolic acidosis Continue pancreatitis dehydration AKI.  Bicarb is stable at 15.  Monitor. Dc po bicarb.  AKI (acute kidney injury) ([place]) Suspect multifactorial in the setting of pancreatitis dehydration.  It has resolved.  Monitor.  Continue Flomax, no symptoms bladder scan-morning has been voiding well -see below- Intake/Output Summary (Last 24 hours) at 10/31/2021 1418 Last data filed at 10/31/2021 1301 Gross per 24 hour  Intake 338 ml  Output 1526 ml  Net -1188 ml   Recent  Labs  Lab 10/28/21 0419 10/29/21 0527 10/30/21 0111 10/30/21 0602 10/31/21 0400  BUN 21 38* 43* 42* 37*   CREATININE 0.95 1.77* 1.45* 1.33* 1.09    Lactic acidosis In the setting of pancreatitis and dehydration.  Resolved.   Recent Labs  Lab 10/30/21 0602 10/30/21 0806 10/31/21 0400  LATICACIDVEN 2.3* 2.9* 1.6     Hypokalemia- (present on admission) Resolved Recent Labs  Lab 10/28/21 0419 10/29/21 0527 10/30/21 0111 10/30/21 0602 10/31/21 0400  K 3.7 4.8 3.9 4.0 3.8    Class II obesity Will benefit with PCP follow-up, weight loss.  Bandemia- (present on admission) Bacteremia likely in the setting of pancreatitis.   Recent Labs  Lab 10/27/21 2312 10/28/21 0419 10/29/21 0527 10/30/21 0602 10/31/21 0400  WBC 15.7* 12.1* 11.4* 8.5 8.6    Polycythemia Likely from hemconcentration and sleep apnea related.  Resolved    Transaminitis with hyperbilirubinemia- (present on admission) Pattern consistent with alcohol or rhabdo but CK within normal.  Improved.  Mixed hyperlipidemia- (present on admission) Holding statin  Gastroesophageal reflux disease- (present on admission) Fluid within distal esophagus suggesting GERD as he reports heartburn Cont PPI bid.  Sleep apnea- (present on admission) usese CPAP at home. Here cont BiPAP  Paroxysmal atrial fibrillation ([place])- (present on admission) Rate uncontrolled, switch to IV Lopressor every 6 hours.Off po Cardizem.Monitor.Remains on iv heparin since admission.  Monitor electrolytes.   Essential hypertension- (present on admission) Blood pressure stable.  Holding losartan.  On IV Lopressor for a fib.    Reevaluated this afternoon with GI at the bedside.  Patient alert awake family feels his breathing is improving compared to last night.  Requesting medication for agitation if happens again-Ativan made it worse.  DVT prophylaxis: SCDs Start: 10/28/21 0322 heaprin gtt Code Status:   Code Status: Full Code Family Communication: plan of care discussed with patient/ his wife and daughter at bedside.  Disposition:  Currently not medically stable for discharge. Status is: Inpatient Remains inpatient appropriate because: Ongoing management of respiratory failure Pancreatitis  Monitor the patient in stepdown unit, he is at high risk of decompensation. Revisited code status- he agrees for FULL CODE but does not want to be ventilatory dependent for more than a week.  Objective: Vitals last 24 hrs: Vitals:   10/31/21 1204 10/31/21 1300 10/31/21 1400 10/31/21 1442  BP:   130/66   Pulse: 94 (!) 112 (!) 102 87  Resp: 20 (!) 25 (!) 28 (!) 23  Temp: 99.7 F (37.6 C)   99.8 F (37.7 C)  TempSrc: Axillary   Axillary  SpO2: 99% 92% 98% 98%  Weight:      Height:       Weight change: 0.8 kg  Physical Examination:  General exam: AA, ill looking, older than stated age, weak appearing. HEENT:Oral mucosa moist, Ear/Nose WNL grossly, dentition normal. Respiratory system: bilaterally diminished BS, no use of accessory muscle Cardiovascular system: S1 & S2 +, No JVD,. Gastrointestinal system: Abdomen soft, distended,BS+ Nervous System:Alert, awake, moving extremities and grossly nonfocal Extremities: LE edema none,distal peripheral pulses palpable.  Skin: No rashes,no icterus. MSK: Normal muscle bulk,tone, power  Medications reviewed:  Scheduled Meds:  brimonidine  1 drop Right Eye BID   Chlorhexidine Gluconate Cloth  6 each Topical Daily   metoprolol tartrate  5 mg Intravenous Q6H   pantoprazole (PROTONIX) IV  40 mg Intravenous Q12H   polyethylene glycol  17 g Oral BID   tamsulosin  0.8 mg Oral QPM   Continuous Infusions:  ampicillin-sulbactam (UNASYN) IV 3 g (10/31/21 1432)   heparin 2,150 Units/hr (10/31/21 1425)     Diet Order             Diet full liquid Room service appropriate? Yes; Fluid consistency: Thin  Diet effective now                    Intake/Output Summary (Last 24 hours) at 10/31/2021 1541 Last data filed at 10/31/2021 1301 Gross per 24 hour  Intake 98 ml  Output 1525  ml  Net -1427 ml   Net IO Since Admission: 470.7 mL [10/31/21 1541]  Wt Readings from Last 3 Encounters:  10/31/21 123.9 kg  05/15/21 113.4 kg  04/20/21 115.8 kg     Unresulted Labs (From admission, onward)     Start     Ordered   11/01/21 0500  Heparin level (unfractionated)  Daily,   R      10/30/21 1903   11/01/21 0500  APTT  Daily,   R      10/30/21 1903   11/01/21 0500  Comprehensive metabolic panel  Daily,   R      10/31/21 1426   10/31/21 1430  APTT  ONCE - STAT,   STAT        10/31/21 0816   10/29/21 0500  CBC  Daily,   R      10/28/21 1027          Data Reviewed: I have personally reviewed following labs and imaging studies CBC: Recent Labs  Lab 10/27/21 2312 10/28/21 0419 10/29/21 0527 10/30/21 0602 10/31/21 0400  WBC 15.7* 12.1* 11.4* 8.5 8.6  NEUTROABS  --  11.0*  --   --   --   HGB 17.0 17.6* 17.2* 14.2 13.5  HCT 49.7 54.0* 53.3* 44.7 40.7  MCV 95.0 95.7 96.9 98.2 97.4  PLT 324 339 296 246 376   Basic Metabolic Panel: Recent Labs  Lab 10/28/21 0419 10/29/21 0527 10/30/21 0111 10/30/21 0602 10/31/21 0400  NA 138 139 137 135 133*  K 3.7 4.8 3.9 4.0 3.8  CL 108 111 110 110 108  CO2 20* 20* 17* 17* 18*  GLUCOSE 176* 161* 137* 141* 135*  BUN 21 38* 43* 42* 37*  CREATININE 0.95 1.77* 1.45* 1.33* 1.09  CALCIUM 8.7* 7.4* 6.9* 6.9* 7.4*  MG 1.7 2.3 2.1 2.2  --   PHOS  --  3.6  --  2.8  --    GFR: Estimated Creatinine Clearance: 79.7 mL/min (by C-G formula based on SCr of 1.09 mg/dL). Liver Function Tests: Recent Labs  Lab 10/27/21 2312 10/28/21 0419 10/29/21 0527 10/30/21 0602 10/31/21 0400  AST 171* 136* 39 24 19  ALT 83* 100* 52* 30 24  ALKPHOS 96 89 62 49 47  BILITOT 1.1 1.6* 0.9 0.6 1.0  PROT 7.0 6.7 5.9* 5.4* 5.3*  ALBUMIN 4.0 3.8 3.0* 2.6* 2.4*   Recent Labs  Lab 10/27/21 2312 10/28/21 0419 10/29/21 0527 10/30/21 0602 10/31/21 0400  LIPASE 1,681* 1,678* 551* 144* 47   Recent Labs  Lab 10/29/21 0527 10/30/21 0602   AMMONIA 25 32   Coagulation Profile: No results for input(s): INR, PROTIME in the last 168 hours. Cardiac Enzymes: Recent Labs  Lab 10/28/21 0419 10/30/21 0602  CKTOTAL 71 78   BNP (last 3 results) No results for input(s): PROBNP in the last 8760 hours. HbA1C: No results for input(s): HGBA1C in the last 72 hours. CBG: No results for input(s): GLUCAP  in the last 168 hours. Lipid Profile: No results for input(s): CHOL, HDL, LDLCALC, TRIG, CHOLHDL, LDLDIRECT in the last 72 hours. Thyroid Function Tests: No results for input(s): TSH, T4TOTAL, FREET4, T3FREE, THYROIDAB in the last 72 hours. Anemia Panel: No results for input(s): VITAMINB12, FOLATE, FERRITIN, TIBC, IRON, RETICCTPCT in the last 72 hours. Sepsis Labs: Recent Labs  Lab 10/29/21 1029 10/30/21 0602 10/30/21 0806 10/31/21 0400  LATICACIDVEN 3.2* 2.3* 2.9* 1.6    Recent Results (from the past 240 hour(s))  Resp Panel by RT-PCR (Flu A&B, Covid) Nasopharyngeal Swab     Status: None   Collection Time: 10/28/21  3:00 AM   Specimen: Nasopharyngeal Swab; Nasopharyngeal(NP) swabs in vial transport medium  Result Value Ref Range Status   SARS Coronavirus 2 by RT PCR NEGATIVE NEGATIVE Final    Comment: (NOTE) SARS-CoV-2 target nucleic acids are NOT DETECTED.  The SARS-CoV-2 RNA is generally detectable in upper respiratory specimens during the acute phase of infection. The lowest concentration of SARS-CoV-2 viral copies this assay can detect is 138 copies/mL. A negative result does not preclude SARS-Cov-2 infection and should not be used as the sole basis for treatment or other patient management decisions. A negative result may occur with  improper specimen collection/handling, submission of specimen other than nasopharyngeal swab, presence of viral mutation(s) within the areas targeted by this assay, and inadequate number of viral copies(<138 copies/mL). A negative result must be combined with clinical observations,  patient history, and epidemiological information. The expected result is Negative.  Fact Sheet for Patients:  EntrepreneurPulse.com.au  Fact Sheet for Healthcare Providers:  IncredibleEmployment.be  This test is no t yet approved or cleared by the [place] FDA and  has been authorized for detection and/or diagnosis of SARS-CoV-2 by FDA under an Emergency Use Authorization (EUA). This EUA will remain  in effect (meaning this test can be used) for the duration of the COVID-19 declaration under Section 564(b)(1) of the Act, 21 U.S.C.section 360bbb-3(b)(1), unless the authorization is terminated  or revoked sooner.       Influenza A by PCR NEGATIVE NEGATIVE Final   Influenza B by PCR NEGATIVE NEGATIVE Final    Comment: (NOTE) The Xpert Xpress SARS-CoV-2/FLU/RSV plus assay is intended as an aid in the diagnosis of influenza from Nasopharyngeal swab specimens and should not be used as a sole basis for treatment. Nasal washings and aspirates are unacceptable for Xpert Xpress SARS-CoV-2/FLU/RSV testing.  Fact Sheet for Patients: EntrepreneurPulse.com.au  Fact Sheet for Healthcare Providers: IncredibleEmployment.be  This test is not yet approved or cleared by the [place] FDA and has been authorized for detection and/or diagnosis of SARS-CoV-2 by FDA under an Emergency Use Authorization (EUA). This EUA will remain in effect (meaning this test can be used) for the duration of the COVID-19 declaration under Section 564(b)(1) of the Act, 21 U.S.C. section 360bbb-3(b)(1), unless the authorization is terminated or revoked.  Performed at [facility], [address], [place], [place] [address]   MRSA Next Gen by PCR, Nasal     Status: None   Collection Time: 10/30/21  5:46 PM   Specimen: Nasal Mucosa; Nasal Swab  Result Value Ref Range Status   MRSA by PCR Next Gen NOT DETECTED NOT DETECTED Final     Comment: (NOTE) The GeneXpert MRSA Assay (FDA approved for NASAL specimens only), is one component of a comprehensive MRSA colonization surveillance program. It is not intended to diagnose MRSA infection nor to guide or monitor treatment for MRSA infections. Test  performance is not FDA approved in patients less than 56 years old. Performed at [facility], [address], [place], [place] [address]     Antimicrobials: Anti-infectives (From admission, onward)    Start     Dose/Rate Route Frequency Ordered Stop   10/30/21 2100  Ampicillin-Sulbactam (UNASYN) 3 g in sodium chloride 0.9 % 100 mL IVPB        3 g 200 mL/hr over 30 Minutes Intravenous Every 6 hours 10/30/21 2024        Culture/Microbiology No results found for: SDES, [place], CULT, REPTSTATUS   Radiology Studies: CT CHEST WO CONTRAST  Result Date: 10/30/2021 CLINICAL DATA:  Pneumonia, complication suspected. Respiratory distress, hypoxia, tachypnea. EXAM: CT CHEST WITHOUT CONTRAST TECHNIQUE: Multidetector CT imaging of the chest was performed following the standard protocol without IV contrast. RADIATION DOSE REDUCTION: This exam was performed according to the departmental dose-optimization program which includes automated exposure control, adjustment of the mA and/or kV according to patient size and/or use of iterative reconstruction technique. COMPARISON:  10/28/2021 FINDINGS: Cardiovascular: Mild aortic and coronary artery calcifications. No aortic aneurysm. Normal heart size. No pericardial effusions. Mediastinum/Nodes: Thyroid gland is unremarkable. Scattered mediastinal lymph nodes are not pathologically enlarged. Esophagus is decompressed. Lungs/Pleura: New development of bilateral basilar atelectasis or consolidation. No pleural effusions. No pneumothorax. Airways are patent. Upper Abdomen: Inflammatory changes again demonstrated in the visualized pancreas although incompletely included. There is hazy stranding around the  pancreatic head and body with soft tissue gas. The gas collections are new since prior study and may represent necrotizing infection or fistula. CT abdomen and pelvis suggested for further evaluation. Musculoskeletal: Degenerative changes in the spine. IMPRESSION: 1. New development of bilateral basilar consolidation or atelectasis in the lungs. 2. Incomplete visualization of changes of pancreatitis in the upper abdomen. New finding of peripancreatic gas suggesting necrotizing infection or fistula. CT abdomen and pelvis suggested for further evaluation. 3. Aortic atherosclerosis. Electronically Signed   By: Lucienne Capers M.D.   On: 10/30/2021 20:00   DG CHEST PORT 1 VIEW  Result Date: 10/30/2021 CLINICAL DATA:  Hypoxia EXAM: PORTABLE CHEST 1 VIEW COMPARISON:  10/29/2021 FINDINGS: Bibasilar opacities, likely atelectasis. Suspect small effusions. Heart is normal size. No acute bony abnormality. IMPRESSION: Small bilateral effusions with bibasilar atelectasis. Electronically Signed   By: Rolm Baptise M.D.   On: 10/30/2021 01:04   ECHOCARDIOGRAM COMPLETE  Result Date: 10/31/2021    ECHOCARDIOGRAM REPORT   Patient Name:   DEANGELO BERNS Date of Exam: 10/31/2021 Medical Rec #:  [id number]      Height:       70.0 in Accession #:    [id number]     Weight:       273.1 lb Date of Birth:  1946/06/05     BSA:          2.383 m Patient Age:    55 years       BP:           118/62 mmHg Patient Gender: M              HR:           113 bpm. Exam Location:  [facility] Procedure: 2D Echo, Cardiac Doppler and Color Doppler Indications:    Dyspnea  History:        Patient has prior history of Echocardiogram examinations, most                 recent 06/09/2014. Arrythmias:Atrial Fibrillation and  Atrial                 Flutter; Risk Factors:Hypertension, Dyslipidemia and Former                 Smoker.  Sonographer:    Wenda Low Referring Phys: [id number] Charlesetta Ivory GONFA  Sonographer Comments: Patient is morbidly obese. Image  acquisition challenging due to respiratory motion. Patient is on Bi-Pap IMPRESSIONS  1. Left ventricular ejection fraction, by estimation, is 60 to 65%. The left ventricle has normal function. The left ventricle has no regional wall motion abnormalities. There is moderate left ventricular hypertrophy. Left ventricular diastolic parameters are indeterminate.  2. Right ventricular systolic function is normal. The right ventricular size is normal. There is normal pulmonary artery systolic pressure.  3. Left atrial size was mildly dilated.  4. Prominent epicardial adipose tissue.  5. The mitral valve is normal in structure. No evidence of mitral valve regurgitation. No evidence of mitral stenosis.  6. The aortic valve is tricuspid. There is mild calcification of the aortic valve. Aortic valve regurgitation is not visualized. Aortic valve sclerosis is present, with no evidence of aortic valve stenosis.  7. The inferior vena cava is normal in size with greater than 50% respiratory variability, suggesting right atrial pressure of 3 mmHg. FINDINGS  Left Ventricle: Left ventricular ejection fraction, by estimation, is 60 to 65%. The left ventricle has normal function. The left ventricle has no regional wall motion abnormalities. The left ventricular internal cavity size was normal in size. There is  moderate left ventricular hypertrophy. Left ventricular diastolic parameters are indeterminate. Right Ventricle: The right ventricular size is normal. No increase in right ventricular wall thickness. Right ventricular systolic function is normal. There is normal pulmonary artery systolic pressure. The tricuspid regurgitant velocity is 2.36 m/s, and  with an assumed right atrial pressure of 3 mmHg, the estimated right ventricular systolic pressure is 86.7 mmHg. Left Atrium: Left atrial size was mildly dilated. Right Atrium: Right atrial size was normal in size. Pericardium: Prominent epicardial adipose tissue. There is no evidence  of pericardial effusion. Mitral Valve: The mitral valve is normal in structure. No evidence of mitral valve regurgitation. No evidence of mitral valve stenosis. MV peak gradient, 5.5 mmHg. The mean mitral valve gradient is 2.0 mmHg. Tricuspid Valve: The tricuspid valve is normal in structure. Tricuspid valve regurgitation is not demonstrated. No evidence of tricuspid stenosis. Aortic Valve: The aortic valve is tricuspid. There is mild calcification of the aortic valve. Aortic valve regurgitation is not visualized. Aortic valve sclerosis is present, with no evidence of aortic valve stenosis. Aortic valve mean gradient measures 4.5 mmHg. Aortic valve peak gradient measures 10.3 mmHg. Aortic valve area, by VTI measures 2.73 cm. Pulmonic Valve: The pulmonic valve was normal in structure. Pulmonic valve regurgitation is not visualized. No evidence of pulmonic stenosis. Aorta: The aortic root is normal in size and structure. Venous: The inferior vena cava is normal in size with greater than 50% respiratory variability, suggesting right atrial pressure of 3 mmHg. IAS/Shunts: No atrial level shunt detected by color flow Doppler.  LEFT VENTRICLE PLAX 2D LVIDd:         3.50 cm   Diastology LVIDs:         2.10 cm   LV e' medial:    15.40 cm/s LV PW:         1.20 cm   LV E/e' medial:  7.5 LV IVS:        1.50  cm   LV e' lateral:   13.30 cm/s LVOT diam:     2.10 cm   LV E/e' lateral: 8.7 LV SV:         71 LV SV Index:   30 LVOT Area:     3.46 cm  RIGHT VENTRICLE RV Basal diam:  3.55 cm RV Mid diam:    2.70 cm RV S prime:     16.43 cm/s LEFT ATRIUM             Index        RIGHT ATRIUM           Index LA diam:        4.30 cm 1.80 cm/m   RA Area:     22.00 cm LA Vol (A2C):   54.0 ml 22.66 ml/m  RA Volume:   69.50 ml  29.17 ml/m LA Vol (A4C):   69.4 ml 29.12 ml/m LA Biplane Vol: 67.5 ml 28.33 ml/m  AORTIC VALVE                     PULMONIC VALVE AV Area (Vmax):    2.72 cm      PV Vmax:       1.08 m/s AV Area (Vmean):   2.67  cm      PV Peak grad:  4.7 mmHg AV Area (VTI):     2.73 cm AV Vmax:           160.50 cm/s AV Vmean:          100.300 cm/s AV VTI:            0.260 m AV Peak Grad:      10.3 mmHg AV Mean Grad:      4.5 mmHg LVOT Vmax:         126.00 cm/s LVOT Vmean:        77.200 cm/s LVOT VTI:          0.205 m LVOT/AV VTI ratio: 0.79  AORTA Ao Root diam: 3.20 cm MITRAL VALVE                TRICUSPID VALVE MV Area (PHT): 3.17 cm     TR Peak grad:   22.3 mmHg MV Area VTI:   3.48 cm     TR Vmax:        236.00 cm/s MV Peak grad:  5.5 mmHg MV Mean grad:  2.0 mmHg     SHUNTS MV Vmax:       1.17 m/s     Systemic VTI:  0.20 m MV Vmean:      65.2 cm/s    Systemic Diam: 2.10 cm MV Decel Time: 239 msec MV E velocity: 116.00 cm/s Jenkins Rouge MD Electronically signed by Jenkins Rouge MD Signature Date/Time: 10/31/2021/10:20:51 AM    Final      LOS: 3 days   Antonieta Pert, MD [facility]  10/31/2021, 3:41 PM

## 2021-10-31 NOTE — Progress Notes (Signed)
ANTICOAGULATION CONSULT NOTE   Pharmacy Consult for Heparin Indication: atrial fibrillation  No Known Allergies  Patient Measurements: Height: 5\' 10"  (177.8 cm) Weight: 123.9 kg (273 lb 2.4 oz) IBW/kg (Calculated) : 73 HEPARIN DW (KG): 100.9   Vital Signs: Temp: 98.8 F (37.1 C) (02/08 1719) Temp Source: Oral (02/08 1719) BP: 114/45 (02/08 1600) Pulse Rate: 136 (02/08 1719)  Labs: Recent Labs    10/29/21 0527 10/29/21 0807 10/29/21 0829 10/29/21 1712 10/30/21 0111 10/30/21 0602 10/30/21 1723 10/31/21 0400 10/31/21 1616  HGB 17.2*  --   --   --   --  14.2  --  13.5  --   HCT 53.3*  --   --   --   --  44.7  --  40.7  --   PLT 296  --   --   --   --  246  --  220  --   APTT  --    < >  --    < >  --  63* 65* 61* 59*  HEPARINUNFRC  --   --  <0.10*  --   --  >1.10*  --  0.82*  --   CREATININE 1.77*  --   --   --  1.45* 1.33*  --  1.09  --   CKTOTAL  --   --   --   --   --  12  --   --   --   TROPONINIHS  --   --   --   --  5  --   --   --   --    < > = values in this interval not displayed.     Estimated Creatinine Clearance: 79.7 mL/min (by C-G formula based on SCr of 1.09 mg/dL).   Assessment: Patient admitted with acute pancreatitis. He is chronically anticoagulated with eliquis for afib. Last dose was 2/4 at 0800. Holding eliquis in case he needs procedure and transitioning to heparin. Eliquis will be affecting heparin levels so will utilize aPTT for monitoring until levels correlate.  aPTT 59 seconds, sub-therapeutic; no issues with infusion per RN  Goal of Therapy:  Heparin level 0.3-0.7 units/ml aPTT 66-102 seconds Monitor platelets by anticoagulation protocol: Yes   Plan:  Increase heparin infusion to 2300 units/hr Check APTT in ~8 hours Daily PTT, CBC  Thank you Isac Sarna, BS Vena Austria, BCPS Clinical Pharmacist Pager [phone] 10/31/2021 5:51 PM

## 2021-10-31 NOTE — Progress Notes (Signed)
Patient placed on BIPAP for the night.  Tolerating well at this time.  RT will continue to monitor.  

## 2021-10-31 NOTE — Progress Notes (Incomplete)
*  PRELIMINARY RESULTS* Echocardiogram 2D Echocardiogram has been performed.  Sean Golden 10/31/2021, 10:11 AM

## 2021-10-31 NOTE — Progress Notes (Signed)
ANTICOAGULATION CONSULT NOTE   Pharmacy Consult for Heparin Indication: atrial fibrillation  No Known Allergies  Patient Measurements: Height: 5\' 10"  (177.8 cm) Weight: 123.4 kg (272 lb 0.8 oz) IBW/kg (Calculated) : 73 HEPARIN DW (KG): 100.9   Vital Signs: Temp: 98 F (36.7 C) (02/07 2000) Temp Source: Oral (02/07 2000) BP: 108/60 (02/08 0400) Pulse Rate: 100 (02/08 0400)  Labs: Recent Labs    10/29/21 0527 10/29/21 0807 10/29/21 0829 10/29/21 1712 10/30/21 0111 10/30/21 0602 10/30/21 1723 10/31/21 0400  HGB 17.2*  --   --   --   --  14.2  --  13.5  HCT 53.3*  --   --   --   --  44.7  --  40.7  PLT 296  --   --   --   --  246  --  220  APTT  --    < >  --    < >  --  63* 65* 61*  HEPARINUNFRC  --   --  <0.10*  --   --  >1.10*  --  0.82*  CREATININE 1.77*  --   --   --  1.45* 1.33*  --  1.09  CKTOTAL  --   --   --   --   --  78  --   --   TROPONINIHS  --   --   --   --  5  --   --   --    < > = values in this interval not displayed.     Estimated Creatinine Clearance: 79.6 mL/min (by C-G formula based on SCr of 1.09 mg/dL).   Assessment: Patient admitted with acute pancreatitis. He is chronically anticoagulated with eliquis for afib. Last dose was 2/4 at 0800. Holding eliquis in case he needs procedure and transitioning to heparin. Eliquis will be affecting heparin levels so will utilize aPTT for monitoring until levels correlate.  aPTT 61 seconds (slightly sub-therapeutic)  Goal of Therapy:  Heparin level 0.3-0.7 units/ml aPTT 66-102 seconds Monitor platelets by anticoagulation protocol: Yes   Plan:  Increase heparin infusion to 2150 units/hr Daily PTT, CBC  Thank you Anette Guarneri, PharmD Please see amion for complete clinical pharmacist phone list 10/31/2021 6:18 AM

## 2021-10-31 NOTE — Progress Notes (Signed)
Patient noted coughing when drinking broth & water at lunch, advised to hold off on drinking or eating anything more at this time, unable to swallow PO meds at this time

## 2021-10-31 NOTE — Progress Notes (Signed)
Subjective: Patient placed on Bipap this morning due to increased WOB.patient is without abdominal pain or nausea at this time. Appears to be tolerating Bipap well. Patient was able to drink his coffee and a few spoonfulls of ice cream on breakfast tray, per daughter at bedside.   Objective: Vital signs in last 24 hours: Temp:  [98 F (36.7 C)-99.5 F (37.5 C)] 99.5 F (37.5 C) (02/08 0400) Pulse Rate:  [68-107] 101 (02/08 0746) Resp:  [20-37] 27 (02/08 0746) BP: (90-136)/(56-86) 118/62 (02/08 0746) SpO2:  [91 %-100 %] 96 % (02/08 0746) FiO2 (%):  [50 %] 50 % (02/07 1829) Weight:  [123.4 kg-123.9 kg] 123.9 kg (02/08 0500) Last BM Date: 10/30/21 General:   Alert and oriented, pleasant Head:  Normocephalic and atraumatic. Eyes:  No icterus, sclera clear. Conjuctiva pink.  Mouth:  Without lesions, mucosa pink and moist.   Heart:  S1, S2 present, no murmurs noted.  Lungs: Rhonchi Abdomen:  Bowel sounds present, soft, non-tender, non-distended. No HSM or hernias noted. No rebound or guarding. No masses appreciated  Msk:  Symmetrical without gross deformities. Normal posture. Pulses:  Normal pulses noted. Extremities:  Without clubbing or edema. Neurologic:  Alert and  oriented x4;  grossly normal neurologically. Skin:  Warm and dry, intact without significant lesions.  Psych:  Alert and cooperative. Normal mood and affect.  Intake/Output from previous day: 02/07 0701 - 02/08 0700 In: 698 [P.O.:600; I.V.:98] Out: 1051 [Urine:1050; Stool:1] Intake/Output this shift: No intake/output data recorded.  Lab Results: Recent Labs    10/29/21 0527 10/30/21 0602 10/31/21 0400  WBC 11.4* 8.5 8.6  HGB 17.2* 14.2 13.5  HCT 53.3* 44.7 40.7  PLT 296 246 220   BMET Recent Labs    10/30/21 0111 10/30/21 0602 10/31/21 0400  NA 137 135 133*  K 3.9 4.0 3.8  CL 110 110 108  CO2 17* 17* 18*  GLUCOSE 137* 141* 135*  BUN 43* 42* 37*  CREATININE 1.45* 1.33* 1.09  CALCIUM 6.9* 6.9*  7.4*   LFT Recent Labs    10/29/21 0527 10/30/21 0602 10/31/21 0400  PROT 5.9* 5.4* 5.3*  ALBUMIN 3.0* 2.6* 2.4*  AST 39 24 19  ALT 52* 30 24  ALKPHOS 62 49 47  BILITOT 0.9 0.6 1.0    Studies/Results: CT CHEST WO CONTRAST  Result Date: 10/30/2021 CLINICAL DATA:  Pneumonia, complication suspected. Respiratory distress, hypoxia, tachypnea. EXAM: CT CHEST WITHOUT CONTRAST TECHNIQUE: Multidetector CT imaging of the chest was performed following the standard protocol without IV contrast. RADIATION DOSE REDUCTION: This exam was performed according to the departmental dose-optimization program which includes automated exposure control, adjustment of the mA and/or kV according to patient size and/or use of iterative reconstruction technique. COMPARISON:  10/28/2021 FINDINGS: Cardiovascular: Mild aortic and coronary artery calcifications. No aortic aneurysm. Normal heart size. No pericardial effusions. Mediastinum/Nodes: Thyroid gland is unremarkable. Scattered mediastinal lymph nodes are not pathologically enlarged. Esophagus is decompressed. Lungs/Pleura: New development of bilateral basilar atelectasis or consolidation. No pleural effusions. No pneumothorax. Airways are patent. Upper Abdomen: Inflammatory changes again demonstrated in the visualized pancreas although incompletely included. There is hazy stranding around the pancreatic head and body with soft tissue gas. The gas collections are new since prior study and may represent necrotizing infection or fistula. CT abdomen and pelvis suggested for further evaluation. Musculoskeletal: Degenerative changes in the spine. IMPRESSION: 1. New development of bilateral basilar consolidation or atelectasis in the lungs. 2. Incomplete visualization of changes of pancreatitis in the upper abdomen.  New finding of peripancreatic gas suggesting necrotizing infection or fistula. CT abdomen and pelvis suggested for further evaluation. 3. Aortic atherosclerosis.  Electronically Signed   By: Lucienne Capers M.D.   On: 10/30/2021 20:00   US RENAL  Result Date: 10/29/2021 CLINICAL DATA:  Acute kidney injury EXAM: RENAL / URINARY TRACT ULTRASOUND COMPLETE COMPARISON:  CT October 28, 2021 FINDINGS: Right Kidney: Renal measurements: 11.8 x 6.1 x 5.5 cm = volume: 207 mL. Echogenicity within normal limits. No mass or hydronephrosis visualized. Left Kidney: Renal measurements: 12.7 x 6.6 x 6.3 cm = volume: 275 mL. Echogenicity within normal limits. No mass or hydronephrosis visualized. Bladder: Not visualized. Other: None. IMPRESSION: No hydronephrosis. Electronically Signed   By: Dahlia Bailiff M.D.   On: 10/29/2021 11:46   DG CHEST PORT 1 VIEW  Result Date: 10/30/2021 CLINICAL DATA:  Hypoxia EXAM: PORTABLE CHEST 1 VIEW COMPARISON:  10/29/2021 FINDINGS: Bibasilar opacities, likely atelectasis. Suspect small effusions. Heart is normal size. No acute bony abnormality. IMPRESSION: Small bilateral effusions with bibasilar atelectasis. Electronically Signed   By: Rolm Baptise M.D.   On: 10/30/2021 01:04   DG Chest Port 1 View  Result Date: 10/29/2021 CLINICAL DATA:  Abdominal pain and shortness of breath, pancreatitis EXAM: PORTABLE CHEST 1 VIEW COMPARISON:  Portable exam 0914 hours compared to 10/27/2021 FINDINGS: Normal heart size, mediastinal contours, and pulmonary vascularity. Bibasilar atelectasis. Remaining lungs clear. No definite pleural effusion or pneumothorax. IMPRESSION: Bibasilar atelectasis. Electronically Signed   By: Lavonia Dana M.D.   On: 10/29/2021 09:23    Assessment: Sean Golden is a 76 year old male admitted with pancreatitis felt most likely secondary to alcohol; unable to rule out microlithiasis in the setting of new onset elevated transaminases with AST>ALT, but suspect this was more likely related to alcohol use, acute illness, fatty liver as LFTs have now normalized.  Ultrasound abdomen without gallstones.  CTA with acute interstitial/edematous  pancreatitis on admission.   Patient had been improving clinically in regards to acute pancreatitis, as he was able to tolerate liquid diet without nausea or vomiting, though he started having more SOB Monday with need for higher requirements of supplemental O2, with findings of small bilateral pleural effusions on CXR yesterday, BNP WNL. Patient transitioned to Bipap this morning due to increased WOB. ECHO with EF 60-65%. CT chest w/o contrast done yesterday evening with new development of bilateral basilar consolidation or atelectassis in lungs, new finding of peripancreatic gas suggesting necrotizing infection or fistula, though incomplete visualization of changes of pancreatitis. Unasyn 3g Q6H started thereafter. Notably, WBC remains WNL at 8.6 and lipase has normalized, as has lactic acid at 1.6. patient is without abdominal pain or nausea, was able to tolerate liquids and some ice cream this morning. Had a BM yesterday.   Plan: Alcohol abstinence Continue supportive measures Continue with Unasyn 3g Q6H Will need dedicated pancreatic imaging once respiratory status improves Continue with gentle IV fluids   LOS: 3 days    10/31/2021, 9:05 AM    L. Alver Sorrow, MSN, APRN, AGNP-C Adult-Gerontology Nurse Practitioner [facility]

## 2021-10-31 NOTE — Progress Notes (Signed)
Patient taken off of BIPAP and placed on 10L nasal cannula. Tolerating well at this time. RN aware.

## 2021-10-31 NOTE — Progress Notes (Signed)
Patient taken off of BIPAP and placed on 10L HFNC. Patient is tolerating well at this time and vitals are as follows: HR 101, RR 27, sat 95%, and BP 118/62. BIPAP remains in room on standby if needed. RN aware. Will continue to monitor as needed.

## 2021-10-31 NOTE — Progress Notes (Signed)
RT placed patient back on BIPAP due to increased WOB. Patient tolerating well at this time. RN aware.

## 2021-10-31 NOTE — Progress Notes (Signed)
PT Cancellation Note  Patient Details Name: Sean Golden MRN: [id number] DOB: 1946/06/28   Cancelled Treatment:    Reason Eval/Treat Not Completed: Medical issues which prohibited therapy.  Patient transferred to a higher level of care and will need new PT consult to resume therapy when patient is medically stable.  Thank you.   8:16 AM, 10/31/21 Lonell Grandchild, MPT Physical Therapist with [facility] [phone] office [phone] mobile phone

## 2021-11-01 ENCOUNTER — Inpatient Hospital Stay (HOSPITAL_COMMUNITY): Payer: PPO

## 2021-11-01 DIAGNOSIS — K859 Acute pancreatitis without necrosis or infection, unspecified: Secondary | ICD-10-CM

## 2021-11-01 DIAGNOSIS — R1013 Epigastric pain: Secondary | ICD-10-CM

## 2021-11-01 DIAGNOSIS — R0602 Shortness of breath: Secondary | ICD-10-CM | POA: Insufficient documentation

## 2021-11-01 DIAGNOSIS — K8591 Acute pancreatitis with uninfected necrosis, unspecified: Secondary | ICD-10-CM

## 2021-11-01 LAB — COMPREHENSIVE METABOLIC PANEL
ALT: 22 U/L (ref 0–44)
AST: 21 U/L (ref 15–41)
Albumin: 2.2 g/dL — ABNORMAL LOW (ref 3.5–5.0)
Alkaline Phosphatase: 52 U/L (ref 38–126)
Anion gap: 7 (ref 5–15)
BUN: 20 mg/dL (ref 8–23)
CO2: 20 mmol/L — ABNORMAL LOW (ref 22–32)
Calcium: 7.5 mg/dL — ABNORMAL LOW (ref 8.9–10.3)
Chloride: 106 mmol/L (ref 98–111)
Creatinine, Ser: 0.87 mg/dL (ref 0.61–1.24)
GFR, Estimated: >60 mL/min (ref >60)
Glucose, Bld: 143 mg/dL — ABNORMAL HIGH (ref 70–99)
Potassium: 3.5 mmol/L (ref 3.5–5.1)
Sodium: 133 mmol/L — ABNORMAL LOW (ref 135–145)
Total Bilirubin: 1.2 mg/dL (ref 0.3–1.2)
Total Protein: 5 g/dL — ABNORMAL LOW (ref 6.5–8.1)

## 2021-11-01 LAB — APTT
aPTT: 67 seconds — ABNORMAL HIGH (ref 24–36)
aPTT: 56 seconds — ABNORMAL HIGH (ref 24–36)
aPTT: 63 seconds — ABNORMAL HIGH (ref 24–36)

## 2021-11-01 LAB — CBC
HCT: 38.3 % — ABNORMAL LOW (ref 39.0–52.0)
Hemoglobin: 12.8 g/dL — ABNORMAL LOW (ref 13.0–17.0)
MCH: 32.2 pg (ref 26.0–34.0)
MCHC: 33.4 g/dL (ref 30.0–36.0)
MCV: 96.5 fL (ref 80.0–100.0)
Platelets: 222 10*3/uL (ref 150–400)
RBC: 3.97 MIL/uL — ABNORMAL LOW (ref 4.22–5.81)
RDW: 13.4 % (ref 11.5–15.5)
WBC: 9.3 10*3/uL (ref 4.0–10.5)
nRBC: 0 % (ref 0.0–0.2)

## 2021-11-01 LAB — HEPARIN LEVEL (UNFRACTIONATED)
Heparin Unfractionated: 0.48 IU/mL (ref 0.30–0.70)
Heparin Unfractionated: 0.41 IU/mL (ref 0.30–0.70)

## 2021-11-01 MED ORDER — DILTIAZEM HCL ER COATED BEADS 240 MG PO CP24
240.0000 mg | ORAL_CAPSULE | Freq: Every day | ORAL | Status: DC
Start: 2021-11-01 — End: 2021-11-01

## 2021-11-01 MED ORDER — SODIUM CHLORIDE 0.9 % IV SOLN
1.0000 g | Freq: Three times a day (TID) | INTRAVENOUS | Status: DC
  Administered 2021-11-01 – 2021-11-07 (×18): 1 g via INTRAVENOUS
  Filled 2021-11-01 (×19): qty 20

## 2021-11-01 MED ORDER — IOHEXOL 9 MG/ML PO SOLN
ORAL | Status: AC
  Filled 2021-11-01: qty 500

## 2021-11-01 MED ORDER — DILTIAZEM HCL ER COATED BEADS 120 MG PO CP24: 120.0000 mg | ORAL_CAPSULE | Freq: Every day | ORAL | Status: DC

## 2021-11-01 MED ORDER — DILTIAZEM HCL ER COATED BEADS 120 MG PO CP24
240.0000 mg | ORAL_CAPSULE | Freq: Every day | ORAL | Status: DC
  Administered 2021-11-01 – 2021-11-12 (×12): 240 mg via ORAL
  Filled 2021-11-01: qty 2
  Filled 2021-11-01: qty 1
  Filled 2021-11-01 (×2): qty 2
  Filled 2021-11-01 (×3): qty 1
  Filled 2021-11-01: qty 2
  Filled 2021-11-01 (×2): qty 1
  Filled 2021-11-01 (×2): qty 2

## 2021-11-01 MED ORDER — IOHEXOL 300 MG/ML  SOLN
100.0000 mL | Freq: Once | INTRAMUSCULAR | Status: AC | PRN
  Administered 2021-11-01: 100 mL via INTRAVENOUS

## 2021-11-01 NOTE — Progress Notes (Signed)
OT Cancellation Note  Patient Details Name: Sean Golden MRN: [id number] DOB: July 29, 1946   Cancelled Treatment:    Reason Eval/Treat Not Completed: Medical issues which prohibited therapy. Patient transferred to a higher level of care and will need new OT consult to resume therapy when patient is medically stable.  Thank you.    OT, MOT  Larey Seat 11/01/2021, 8:06 AM

## 2021-11-01 NOTE — Progress Notes (Signed)
Subjective:    Wife at bedside. Patient states his breathing is better. He denies abdominal pain. Tolerating clear liquids. He states he had BM. Wife states he does not like BiPAP. Currently on HFNC and tolerating.   Objective:   Vital signs in last 24 hours: Temp:  [98.6 F (37 C)-99.8 F (37.7 C)] 98.6 F (37 C) (02/09 0545) Pulse Rate:  [80-136] 93 (02/09 0900) Resp:  [16-33] 19 (02/09 0900) BP: (100-141)/(45-100) 128/77 (02/09 0900) SpO2:  [92 %-100 %] 96 % (02/09 0900) FiO2 (%):  [50 %] 50 % (02/08 2301) Weight:  [125.6 kg] 125.6 kg (02/09 0545) Last BM Date: 10/30/21 General:   Alert,  appears to not feel well. Resting comfortably upon entering room. No respiratory distress. Easily awakens. Pleasant and cooperative in NAD Head:  Normocephalic and atraumatic. Eyes:  Sclera clear, no icterus.  Chest: Diminished breath sounds.     Heart:  Tachycardia, no murmurs, clicks, rubs,  or gallops. Abdomen:  Soft, obese, nontender. Normal bowel sounds, without guarding, and without rebound.   Extremities:  Without clubbing, deformity. Trace to 1+ bilateral lower extremity edema. Neurologic:  Alert and  oriented x4;  grossly normal neurologically. Skin:  Intact without significant lesions or rashes. Psych:  Alert and cooperative. Normal mood and affect.  Intake/Output from previous day: 02/08 0701 - 02/09 0700 In: 752.6 [P.O.:240; I.V.:412.6; IV Piggyback:100] Out: 675 [Urine:675] Intake/Output this shift: No intake/output data recorded.  Lab Results: CBC Recent Labs    10/30/21 0602 10/31/21 0400 11/01/21 0351  WBC 8.5 8.6 9.3  HGB 14.2 13.5 12.8*  HCT 44.7 40.7 38.3*  MCV 98.2 97.4 96.5  PLT 246 220 222   BMET Recent Labs    10/30/21 0602 10/31/21 0400 11/01/21 0351  NA 135 133* 133*  K 4.0 3.8 3.5  CL 110 108 106  CO2 17* 18* 20*  GLUCOSE 141* 135* 143*  BUN 42* 37* 20  CREATININE 1.33* 1.09 0.87  CALCIUM 6.9* 7.4* 7.5*   LFTs Recent Labs     10/30/21 0602 10/31/21 0400 11/01/21 0351  BILITOT 0.6 1.0 1.2  ALKPHOS 49 47 52  AST 24 19 21   ALT 30 24 22   PROT 5.4* 5.3* 5.0*  ALBUMIN 2.6* 2.4* 2.2*   Recent Labs    10/30/21 0602 10/31/21 0400  LIPASE 144* 47   PT/INR No results for input(s): LABPROT, INR in the last 72 hours.     Imaging Studies: CT CHEST WO CONTRAST  Result Date: 10/30/2021 CLINICAL DATA:  Pneumonia, complication suspected. Respiratory distress, hypoxia, tachypnea. EXAM: CT CHEST WITHOUT CONTRAST TECHNIQUE: Multidetector CT imaging of the chest was performed following the standard protocol without IV contrast. RADIATION DOSE REDUCTION: This exam was performed according to the departmental dose-optimization program which includes automated exposure control, adjustment of the mA and/or kV according to patient size and/or use of iterative reconstruction technique. COMPARISON:  10/28/2021 FINDINGS: Cardiovascular: Mild aortic and coronary artery calcifications. No aortic aneurysm. Normal heart size. No pericardial effusions. Mediastinum/Nodes: Thyroid gland is unremarkable. Scattered mediastinal lymph nodes are not pathologically enlarged. Esophagus is decompressed. Lungs/Pleura: New development of bilateral basilar atelectasis or consolidation. No pleural effusions. No pneumothorax. Airways are patent. Upper Abdomen: Inflammatory changes again demonstrated in the visualized pancreas although incompletely included. There is hazy stranding around the pancreatic head and body with soft tissue gas. The gas collections are new since prior study and may represent necrotizing infection or fistula. CT abdomen and pelvis suggested for further evaluation. Musculoskeletal: Degenerative changes  in the spine. IMPRESSION: 1. New development of bilateral basilar consolidation or atelectasis in the lungs. 2. Incomplete visualization of changes of pancreatitis in the upper abdomen. New finding of peripancreatic gas suggesting necrotizing  infection or fistula. CT abdomen and pelvis suggested for further evaluation. 3. Aortic atherosclerosis. Electronically Signed   By: Lucienne Capers M.D.   On: 10/30/2021 20:00   US RENAL  Result Date: 10/29/2021 CLINICAL DATA:  Acute kidney injury EXAM: RENAL / URINARY TRACT ULTRASOUND COMPLETE COMPARISON:  CT October 28, 2021 FINDINGS: Right Kidney: Renal measurements: 11.8 x 6.1 x 5.5 cm = volume: 207 mL. Echogenicity within normal limits. No mass or hydronephrosis visualized. Left Kidney: Renal measurements: 12.7 x 6.6 x 6.3 cm = volume: 275 mL. Echogenicity within normal limits. No mass or hydronephrosis visualized. Bladder: Not visualized. Other: None. IMPRESSION: No hydronephrosis. Electronically Signed   By: Dahlia Bailiff M.D.   On: 10/29/2021 11:46   DG CHEST PORT 1 VIEW  Result Date: 10/30/2021 CLINICAL DATA:  Hypoxia EXAM: PORTABLE CHEST 1 VIEW COMPARISON:  10/29/2021 FINDINGS: Bibasilar opacities, likely atelectasis. Suspect small effusions. Heart is normal size. No acute bony abnormality. IMPRESSION: Small bilateral effusions with bibasilar atelectasis. Electronically Signed   By: Rolm Baptise M.D.   On: 10/30/2021 01:04   DG Chest Port 1 View  Result Date: 10/29/2021 CLINICAL DATA:  Abdominal pain and shortness of breath, pancreatitis EXAM: PORTABLE CHEST 1 VIEW COMPARISON:  Portable exam 0914 hours compared to 10/27/2021 FINDINGS: Normal heart size, mediastinal contours, and pulmonary vascularity. Bibasilar atelectasis. Remaining lungs clear. No definite pleural effusion or pneumothorax. IMPRESSION: Bibasilar atelectasis. Electronically Signed   By: Lavonia Dana M.D.   On: 10/29/2021 09:23   DG Chest Port 1 View  Result Date: 10/27/2021 CLINICAL DATA:  Epigastric pain. EXAM: PORTABLE CHEST 1 VIEW COMPARISON:  08/17/2009 FINDINGS: Shallow inspiration with atelectasis in the lung bases. Heart size and pulmonary vascularity are normal. No airspace disease or consolidation. Mediastinal  contours appear intact. IMPRESSION: Shallow inspiration with atelectasis in the bases. Electronically Signed   By: Lucienne Capers M.D.   On: 10/27/2021 23:34   DG Abd Portable 1V  Result Date: 10/28/2021 CLINICAL DATA:  Abdominal pain. EXAM: PORTABLE ABDOMEN - 1 VIEW COMPARISON:  CTA chest abdomen pelvis 7 hours ago. FINDINGS: No gaseous bowel dilatation to suggest obstruction. Contrast excretion noted both kidneys with accumulation of contrast in the bladder compatible with CT performed earlier today. Visualized bony anatomy shows no acute finding. IMPRESSION: Negative. Electronically Signed   By: Misty Stanley M.D.   On: 10/28/2021 08:57   ECHOCARDIOGRAM COMPLETE  Result Date: 10/31/2021    ECHOCARDIOGRAM REPORT   Patient Name:   CALLIE BUNYARD Date of Exam: 10/31/2021 Medical Rec #:  [id number]      Height:       70.0 in Accession #:    [id number]     Weight:       273.1 lb Date of Birth:  [birth year]     BSA:          2.383 m Patient Age:    76 years       BP:           118/62 mmHg Patient Gender: M              HR:           113 bpm. Exam Location:  [facility] Procedure: 2D Echo, Cardiac Doppler and Color Doppler Indications:    Dyspnea  History:        Patient has prior history of Echocardiogram examinations, most                 recent 06/09/2014. Arrythmias:Atrial Fibrillation and Atrial                 Flutter; Risk Factors:Hypertension, Dyslipidemia and Former                 Smoker.  Sonographer:    Wenda Low Referring Phys: [id number] Charlesetta Ivory GONFA  Sonographer Comments: Patient is morbidly obese. Image acquisition challenging due to respiratory motion. Patient is on Bi-Pap IMPRESSIONS  1. Left ventricular ejection fraction, by estimation, is 60 to 65%. The left ventricle has normal function. The left ventricle has no regional wall motion abnormalities. There is moderate left ventricular hypertrophy. Left ventricular diastolic parameters are indeterminate.  2. Right ventricular systolic function  is normal. The right ventricular size is normal. There is normal pulmonary artery systolic pressure.  3. Left atrial size was mildly dilated.  4. Prominent epicardial adipose tissue.  5. The mitral valve is normal in structure. No evidence of mitral valve regurgitation. No evidence of mitral stenosis.  6. The aortic valve is tricuspid. There is mild calcification of the aortic valve. Aortic valve regurgitation is not visualized. Aortic valve sclerosis is present, with no evidence of aortic valve stenosis.  7. The inferior vena cava is normal in size with greater than 50% respiratory variability, suggesting right atrial pressure of 3 mmHg. FINDINGS  Left Ventricle: Left ventricular ejection fraction, by estimation, is 60 to 65%. The left ventricle has normal function. The left ventricle has no regional wall motion abnormalities. The left ventricular internal cavity size was normal in size. There is  moderate left ventricular hypertrophy. Left ventricular diastolic parameters are indeterminate. Right Ventricle: The right ventricular size is normal. No increase in right ventricular wall thickness. Right ventricular systolic function is normal. There is normal pulmonary artery systolic pressure. The tricuspid regurgitant velocity is 2.36 m/s, and  with an assumed right atrial pressure of 3 mmHg, the estimated right ventricular systolic pressure is 62.9 mmHg. Left Atrium: Left atrial size was mildly dilated. Right Atrium: Right atrial size was normal in size. Pericardium: Prominent epicardial adipose tissue. There is no evidence of pericardial effusion. Mitral Valve: The mitral valve is normal in structure. No evidence of mitral valve regurgitation. No evidence of mitral valve stenosis. MV peak gradient, 5.5 mmHg. The mean mitral valve gradient is 2.0 mmHg. Tricuspid Valve: The tricuspid valve is normal in structure. Tricuspid valve regurgitation is not demonstrated. No evidence of tricuspid stenosis. Aortic Valve: The  aortic valve is tricuspid. There is mild calcification of the aortic valve. Aortic valve regurgitation is not visualized. Aortic valve sclerosis is present, with no evidence of aortic valve stenosis. Aortic valve mean gradient measures 4.5 mmHg. Aortic valve peak gradient measures 10.3 mmHg. Aortic valve area, by VTI measures 2.73 cm. Pulmonic Valve: The pulmonic valve was normal in structure. Pulmonic valve regurgitation is not visualized. No evidence of pulmonic stenosis. Aorta: The aortic root is normal in size and structure. Venous: The inferior vena cava is normal in size with greater than 50% respiratory variability, suggesting right atrial pressure of 3 mmHg. IAS/Shunts: No atrial level shunt detected by color flow Doppler.  LEFT VENTRICLE PLAX 2D LVIDd:         3.50 cm   Diastology LVIDs:         2.10 cm  LV e' medial:    15.40 cm/s LV PW:         1.20 cm   LV E/e' medial:  7.5 LV IVS:        1.50 cm   LV e' lateral:   13.30 cm/s LVOT diam:     2.10 cm   LV E/e' lateral: 8.7 LV SV:         71 LV SV Index:   30 LVOT Area:     3.46 cm  RIGHT VENTRICLE RV Basal diam:  3.55 cm RV Mid diam:    2.70 cm RV S prime:     16.43 cm/s LEFT ATRIUM             Index        RIGHT ATRIUM           Index LA diam:        4.30 cm 1.80 cm/m   RA Area:     22.00 cm LA Vol (A2C):   54.0 ml 22.66 ml/m  RA Volume:   69.50 ml  29.17 ml/m LA Vol (A4C):   69.4 ml 29.12 ml/m LA Biplane Vol: 67.5 ml 28.33 ml/m  AORTIC VALVE                     PULMONIC VALVE AV Area (Vmax):    2.72 cm      PV Vmax:       1.08 m/s AV Area (Vmean):   2.67 cm      PV Peak grad:  4.7 mmHg AV Area (VTI):     2.73 cm AV Vmax:           160.50 cm/s AV Vmean:          100.300 cm/s AV VTI:            0.260 m AV Peak Grad:      10.3 mmHg AV Mean Grad:      4.5 mmHg LVOT Vmax:         126.00 cm/s LVOT Vmean:        77.200 cm/s LVOT VTI:          0.205 m LVOT/AV VTI ratio: 0.79  AORTA Ao Root diam: 3.20 cm MITRAL VALVE                TRICUSPID VALVE MV  Area (PHT): 3.17 cm     TR Peak grad:   22.3 mmHg MV Area VTI:   3.48 cm     TR Vmax:        236.00 cm/s MV Peak grad:  5.5 mmHg MV Mean grad:  2.0 mmHg     SHUNTS MV Vmax:       1.17 m/s     Systemic VTI:  0.20 m MV Vmean:      65.2 cm/s    Systemic Diam: 2.10 cm MV Decel Time: 239 msec MV E velocity: 116.00 cm/s Jenkins Rouge MD Electronically signed by Jenkins Rouge MD Signature Date/Time: 10/31/2021/10:20:51 AM    Final    CT Angio Chest/Abd/Pel for Dissection W and/or Wo Contrast  Result Date: 10/28/2021 CLINICAL DATA:  Dyspnea, epigastric abdominal pain EXAM: CT ANGIOGRAPHY CHEST, ABDOMEN AND PELVIS TECHNIQUE: Non-contrast CT of the chest was initially obtained. Multidetector CT imaging through the chest, abdomen and pelvis was performed using the standard protocol during bolus administration of intravenous contrast. Multiplanar reconstructed images and MIPs were obtained and reviewed to evaluate the vascular anatomy.  RADIATION DOSE REDUCTION: This exam was performed according to the departmental dose-optimization program which includes automated exposure control, adjustment of the mA and/or kV according to patient size and/or use of iterative reconstruction technique. CONTRAST:  147mL OMNIPAQUE IOHEXOL 350 MG/ML SOLN COMPARISON:  None. FINDINGS: CTA CHEST FINDINGS Cardiovascular: The thoracic aorta is normal in course and caliber; no intramural hematoma, dissection, or aneurysm. Minimal atherosclerotic calcification. No significant coronary artery calcification. Global cardiac size within normal limits. No pericardial effusion. Central pulmonary arteries are of normal caliber. Mediastinum/Nodes: Visualized thyroid is unremarkable. No pathologic thoracic adenopathy. The esophagus is fluid-filled, likely reflecting the sequela of gastroesophageal reflux given gastric distension. Lungs/Pleura: Mild bilateral dependent atelectasis. The lungs are otherwise clear. No pneumothorax or pleural effusion.  Musculoskeletal: No acute bone abnormality Review of the MIP images confirms the above findings. CTA ABDOMEN AND PELVIS FINDINGS VASCULAR Aorta: Normal caliber. No aneurysm or dissection. Mild atherosclerotic calcification. No periaortic inflammatory change. Celiac: Unremarkable SMA: Replaced right hepatic artery.  Otherwise unremarkable. Renals: Dual renal arteries are noted bilaterally. Atherosclerotic calcification results in less than 50% stenoses of the dominant renal arteries at their origins bilaterally. Normal vascular morphology. No aneurysm or dissection. IMA: Widely patent. Inflow: Widely patent. No aneurysm or dissection. Internal iliac arteries are patent bilaterally. Veins: No obvious venous abnormality within the limitations of this arterial phase study. Review of the MIP images confirms the above findings. NON-VASCULAR Hepatobiliary: No focal liver abnormality is seen. No gallstones, gallbladder wall thickening, or biliary dilatation. Pancreas: There is extensive peripancreatic inflammatory stranding with inflammatory fluid tracking into the mesenteric root and into the right anterior pararenal space. Normal enhancement of the pancreatic parenchyma. No loculated intrapancreatic or peripancreatic fluid collections. The pancreatic duct is not dilated. Altogether, the findings are in keeping with changes of acute interstitial/edematous pancreatitis. Spleen: Unremarkable. Adrenals/Urinary Tract: Adrenal glands are unremarkable. Kidneys are normal, without renal calculi, focal lesion, or hydronephrosis. Bladder is unremarkable. Stomach/Bowel: The stomach is distended and fluid filled suggesting changes of gastric outlet obstruction. There is hyperemia of the duodenum as well as extensive periduodenal inflammatory stranding likely related to the adjacent inflammatory process involving the pancreas. The small bowel is unremarkable. There is extensive distal transverse, descending, and sigmoid colonic  diverticulosis as well as ascending colonic diverticulosis. No superimposed acute inflammatory change. Appendix normal. No free intraperitoneal gas or fluid. Lymphatic: No pathologic adenopathy. Reproductive: Prostate is unremarkable. Other: Small bilateral fat containing inguinal hernias and umbilical hernia are identified. Musculoskeletal: Degenerative changes are seen within the lumbar spine. No lytic or blastic bone lesion. No acute bone abnormality. Review of the MIP images confirms the above findings. IMPRESSION: No evidence of aortic dissection or aneurysm. Mild atherosclerotic calcification. No evidence of hemodynamically significant stenosis involving the visceral arterial vasculature or lower extremity arterial inflow. Findings in keeping with acute interstitial/edematous pancreatitis. No evidence of pancreatic or peripancreatic necrosis at this time. Duodenal hyperemia likely related to the adjacent inflammatory process. Fluid distension of the stomach likely related to gastric outlet obstruction secondary to the adjacent inflammatory process. Fluid within the distal esophagus in keeping with gastroesophageal reflux. Aortic Atherosclerosis (ICD10-I70.0). Electronically Signed   By: Fidela Salisbury M.D.   On: 10/28/2021 01:31   US Abdomen Limited RUQ (LIVER/GB)  Result Date: 10/28/2021 CLINICAL DATA:  Acute pancreatitis. EXAM: ULTRASOUND ABDOMEN LIMITED RIGHT UPPER QUADRANT COMPARISON:  CT 10/28/2021 FINDINGS: Gallbladder: No gallstones or wall thickening visualized. No sonographic Murphy sign noted by sonographer. Common bile duct: Diameter: 5.5 mm.  No intrahepatic bile  duct dilatation. Liver: The liver has a heterogeneous echotexture and slightly irregular contour. No focal liver abnormality identified. Trace perihepatic ascites portal vein is patent on color Doppler imaging with normal direction of blood flow towards the liver. Other: None. IMPRESSION: 1. No gallstones or signs of acute  cholecystitis. 2. Echogenic liver compatible with hepatic steatosis 3. Trace perihepatic free fluid Electronically Signed   By: Kerby Moors M.D.   On: 10/28/2021 09:41  [2 weeks]  Assessment:   76 year old male admitted with pancreatitis felt most likely to be secondary to alcohol, unable to rule out microlithiasis in the setting of new onset elevated transaminases but with AST greater than ALT, suspected to be secondary to alcohol use.  Ultrasound abdomen without gallstones.  On admission, CTA chest/abdomen/pelvis with acute interstitial/edematous pancreatitis, fluid distention of the stomach and distal esophagus likely related to gastric outlet obstruction secondary to adjacent inflammatory process.   Acute pancreatitis complicated by necrosis: Chest CT yesterday 2 days ago noted to have gas in the region of the head of the pancreas concerning for necrotizing pancreatitis.  Has been started on Unasyn.  Biochemical markers suggest improvement but he is at risk for complications including pseudocyst formation.  Urine output 675 plus one unmeasured episode yesterday, I/O +277. C  Respiratory distress: Secondary to poor ventilation, bibasilar consolidation or atelectasis.  Patient has required BiPAP at night, HFNC during the day.  Currently on 10 liters oxygen. Echo with EF of 60 to 65%, moderate LVH, indeterminate diastolic parameters, RV systolic function normal.  Patient is full code.  Family has been advised regarding the risk of possible need for ventilatory support yesterday. Today, respiratory status with improvement. Patient states he ambulated to bedside commode without shortness of breath.   Plan:   Clear liquid diet. Dedicated pancreatic imaging today. Alcohol abstinence. Continue Unasyn. Continue IV pantoprazole twice daily. Supportive measures.   LOS: 4 days   Laureen Ochs. Bernarda Caffey [facility] [phone] 2/9/20238:04 AM

## 2021-11-01 NOTE — Progress Notes (Signed)
ANTICOAGULATION CONSULT NOTE  Pharmacy Consult for Heparin Indication: atrial fibrillation Brief A/P: aPTT subtherapeutic Increase Heparin rate  No Known Allergies  Patient Measurements: Height: 5\' 10"  (177.8 cm) Weight: 125.6 kg (276 lb 14.4 oz) IBW/kg (Calculated) : 73 HEPARIN DW (KG): 100.9   Vital Signs: Temp: 99.2 F (37.3 C) (02/09 2000) Temp Source: Oral (02/09 2000) BP: 126/80 (02/09 2000) Pulse Rate: 98 (02/09 2000)  Labs: Recent Labs    10/30/21 0111 10/30/21 0111 10/30/21 0602 10/30/21 1723 10/31/21 0400 10/31/21 1616 11/01/21 0131 11/01/21 0351 11/01/21 1240 11/01/21 1249 11/01/21 2143  HGB  --    < > 14.2  --  13.5  --   --  12.8*  --   --   --   HCT  --   --  44.7  --  40.7  --   --  38.3*  --   --   --   PLT  --   --  246  --  220  --   --  222  --   --   --   APTT  --   --  63*   < > 61*   < > 67*  --   --  56* 63*  HEPARINUNFRC  --    < > >1.10*  --  0.82*  --   --  0.48 0.41  --   --   CREATININE 1.45*  --  1.33*  --  1.09  --   --  0.87  --   --   --   CKTOTAL  --   --  78  --   --   --   --   --   --   --   --   TROPONINIHS 5  --   --   --   --   --   --   --   --   --   --    < > = values in this interval not displayed.     Estimated Creatinine Clearance: 100.5 mL/min (by C-G formula based on SCr of 0.87 mg/dL).  Assessment: 76 y.o. male with h/o Afib, Eliquis on hold, for heparin  Goal of Therapy:  Heparin level 0.3-0.7 units/ml aPTT 66-102 seconds Monitor platelets by anticoagulation protocol: Yes   Plan:  Increase Heparin 2600 units/hr  Phillis Knack, PharmD, BCPS  11/01/2021 11:49 PM

## 2021-11-01 NOTE — Progress Notes (Signed)
Patient taken off BIPAP and placed back on 10L HFNC.  Tolerating well at this time.  RT will continue to monitor.

## 2021-11-01 NOTE — Progress Notes (Signed)
ANTICOAGULATION CONSULT NOTE   Pharmacy Consult for Heparin Indication: atrial fibrillation  No Known Allergies  Patient Measurements: Height: 5\' 10"  (177.8 cm) Weight: 125.6 kg (276 lb 14.4 oz) IBW/kg (Calculated) : 73 HEPARIN DW (KG): 100.9   Vital Signs: Temp: 98.6 F (37 C) (02/09 0545) Temp Source: Axillary (02/09 0545) BP: 149/62 (02/09 1100) Pulse Rate: 113 (02/09 1100)  Labs: Recent Labs    10/29/21 1712 10/30/21 0111 10/30/21 0602 10/30/21 1723 10/31/21 0400 10/31/21 1616 11/01/21 0131 11/01/21 0351 11/01/21 1240 11/01/21 1249  HGB   < >  --  14.2  --  13.5  --   --  12.8*  --   --   HCT  --   --  44.7  --  40.7  --   --  38.3*  --   --   PLT  --   --  246  --  220  --   --  222  --   --   APTT  --   --  63*   < > 61* 59* 67*  --   --  56*  HEPARINUNFRC   < >  --  >1.10*  --  0.82*  --   --  0.48 0.41  --   CREATININE  --  1.45* 1.33*  --  1.09  --   --  0.87  --   --   CKTOTAL  --   --  78  --   --   --   --   --   --   --   TROPONINIHS  --  5  --   --   --   --   --   --   --   --    < > = values in this interval not displayed.     Estimated Creatinine Clearance: 100.5 mL/min (by C-G formula based on SCr of 0.87 mg/dL).   Assessment: Patient admitted with acute pancreatitis. He is chronically anticoagulated with eliquis for afib. Last dose was 2/4 at 0800. Holding eliquis in case he needs procedure and transitioning to heparin. Eliquis will be affecting heparin levels so will utilize aPTT for monitoring until levels correlate.  aPTT 56 seconds, sub-therapeutic; no issues with infusion per RN HL- 0.41  Goal of Therapy:  Heparin level 0.3-0.7 units/ml aPTT 66-102 seconds Monitor platelets by anticoagulation protocol: Yes   Plan:  Increase heparin infusion to 2450 units/hr Check APTT in ~8 hours Daily HL/PTT, CBC  Margot Ables, PharmD Clinical Pharmacist 11/01/2021 1:33 PM

## 2021-11-01 NOTE — Progress Notes (Signed)
ANTICOAGULATION CONSULT NOTE   Pharmacy Consult for Heparin Indication: atrial fibrillation  No Known Allergies  Patient Measurements: Height: 5\' 10"  (177.8 cm) Weight: 123.9 kg (273 lb 2.4 oz) IBW/kg (Calculated) : 73 HEPARIN DW (KG): 100.9   Vital Signs: BP: 134/74 (02/09 0100) Pulse Rate: 109 (02/09 0100)  Labs: Recent Labs    10/29/21 0829 10/29/21 1712 10/30/21 0111 10/30/21 0602 10/30/21 1723 10/31/21 0400 10/31/21 1616  HGB  --   --   --  14.2  --  13.5  --   HCT  --   --   --  44.7  --  40.7  --   PLT  --   --   --  246  --  220  --   APTT  --    < >  --  63* 65* 61* 59*  HEPARINUNFRC <0.10*  --   --  >1.10*  --  0.82*  --   CREATININE  --   --  1.45* 1.33*  --  1.09  --   CKTOTAL  --   --   --  78  --   --   --   TROPONINIHS  --   --  5  --   --   --   --    < > = values in this interval not displayed.     Estimated Creatinine Clearance: 79.7 mL/min (by C-G formula based on SCr of 1.09 mg/dL).   Assessment: Patient admitted with acute pancreatitis. He is chronically anticoagulated with eliquis for afib. Last dose was 2/4 at 0800. Holding eliquis in case he needs procedure and transitioning to heparin. Eliquis will be affecting heparin levels so will utilize aPTT for monitoring until levels correlate.  aPTT 59 seconds, sub-therapeutic; no issues with infusion per RN  2/9 AM update: aPTT therapeutic at 67 Not crossing into Epic due to down time  Goal of Therapy:  Heparin level 0.3-0.7 units/ml aPTT 66-102 seconds Monitor platelets by anticoagulation protocol: Yes   Plan:  Cont heparin infusion at 2300 units/hr 1200 aPTT/heparin level  Narda Bonds, PharmD, BCPS Clinical Pharmacist Phone: [phone]

## 2021-11-01 NOTE — Progress Notes (Signed)
PROGRESS NOTE ABASS MISENER  EUM:[id number] DOB: 1945/12/02 DOA: 10/27/2021 PCP: Celene Squibb, MD   Brief Narrative/Hospital Course: [name], 76 y.o. male 76 year old M with PMH of A-fib on Eliquis, HTN, GERD/erosive esophagitis, H. Pylori and OSA presenting with acute onset cramping epigastric abdominal pain radiating to his right abdomen after he had a dinner, and admitted for acute pancreatitis.  Lipase elevated to 1680.  Mild LFT elevation with pattern consistent with EtOH or rhabdo but CK within normal.  Patient admits to drinking 2 to 3 glasses of wine before bedtime.  CTA C/A/P concerning for acute pancreatitis without necrosis or pseudocyst.  CT also showed fluid distention of the stomach and esophagus concerning for GOO or an GERD.  Started on IV fluid, IV analgesics and antiemetics, and admitted.  GI consulted.    RUQ [place] negative for gallstone or signs of acute cholecystitis but hepatic steatosis.  Pancreatitis improving clinically and chemically.  However, patient developed respiratory distress with hypoxia requiring supplemental oxygen likely from atelectasis and IV fluid.  IV fluid discontinued.  Started on IV Lasix. CT chest 2/7-New development of bilateral basilar consolidation or atelectasis in the lungs, New finding of peripancreatic gas suggesting necrotizing infection or fistula.     Subjective: Seen and examined this morning.  Wife is at the bedside. Patient has no complaint Overnight his labs remains stable with bicarb 20, stable renal functions and LFTs. Patient did tolerated BiPAP during night-transition to HFNC this morning He has been tachycardic up to 120s and also tachypneic Iron overload started 675 urine output with 1 times unmeasured urine output, Getting Dilaudid IV for pain did not need Haldol.   Patient reexamined with family at the bedside at 3 PM reviewed CT results and further plan for CT scan antibiotic changed.  He is now down to 4 L from 10 L  HFNC.  Assessment and Plan: * Acute necrotizing pancreatitis- (present on admission) CT chest 2/7-showed new finding of peripancreatic edema suggestive of necrotizing infection or fistula-started on Unasyn 2/7.overall he seems to be doing okay in terms of his abdominal pain, tolerating clear liquid, having bowel movement.  LFTs lipase renal function stable.  Discussed with GI appreciate input getting CT pancreas today.  Continue pain control with IV Dilaudid, IV PPI, OOB/PT OT and supportive care.  Gentle IV fluid hydration.  CT pancreas was resulted discussed with GI-showing signs of worsening pancreatitis also some locules of gas adjacent to the duodenum, antibiotic being changed to meropenem and also getting CT to exclude the possibility of GI source of gas.  On reexamined patient's abdomen is full and no significant changes-and no significant abd pain, clinically stable, hypoxia improving and on 4l [place] now  Acute respiratory failure with hypoxia (HCC) Multifactorial in the setting of severe pancreatitis, morbid obesity with sleep apnea,volume resuscitation.CT chest 2/7 bilateral basilar consolidation or atelectasis.  Status post Lasix x2 2/7.BNP slightly elevated.Echo shows EF 60 to 65%, no RWMA, moderate LVH indeterminate diastolic parameters, RV systolic function normal.Patient remains on anticoagulation and low suspicion of VTE.  We will continue with supportive care including I-S, BiPAP, HFNC.  Does have lower leg edema and since creatinine is stable we can dose with Lasix x1 today and reassess in the morning.High risk of respiratory decompensation, continue in ICU with/stepdown.  On reexamining this afternoon oxygen is down to 4 L.Net IO Since Admission: 470.7 mL [10/31/21 1414]  Filed Weights   10/30/21 0508 10/30/21 1756 10/31/21 0500  Weight: 122.6 kg 123.4  kg 245.8 kg     Metabolic acidosis Bicarb improving.  Continue gentle IV fluids and monitor   AKI (acute kidney injury)  ([place]) Resolved.Suspect multifactorial in the setting of pancreatitis dehydration.Continue Flomax, no symptoms bladder scan-morning has been voiding well -see below-  Intake/Output Summary (Last 24 hours) at 11/01/2021 1109 Last data filed at 11/01/2021 0900 Gross per 24 hour  Intake 2026.09 ml  Output 1076 ml  Net 950.09 ml   Recent Labs  Lab 10/29/21 0527 10/30/21 0111 10/30/21 0602 10/31/21 0400 11/01/21 0351  BUN 38* 43* 42* 37* 20  CREATININE 1.77* 1.45* 1.33* 1.09 0.87    Lactic acidosis In the setting of pancreatitis and dehydration.  Resolved.   Recent Labs  Lab 10/30/21 0602 10/30/21 0806 10/31/21 0400  LATICACIDVEN 2.3* 2.9* 1.6     Hypokalemia- (present on admission) Stable Recent Labs  Lab 10/29/21 0527 10/30/21 0111 10/30/21 0602 10/31/21 0400 11/01/21 0351  K 4.8 3.9 4.0 3.8 3.5    Paroxysmal atrial fibrillation ([place])- (present on admission) Rate remains uncontrolled.  Resume Cardizem to 40 this morning.  It takes 240 in the morning and 120 nightly of long-acting. Cont to IV Lopressor for HR >130. Remains on iv heparin since admission.  Continue on telemetry. Monitor electrolytes.   Obesity with sleep apnea Will benefit with PCP follow-up, weight loss.  On CPAP at home.  Currently needing BiPAP here bedtime.  Bandemia- (present on admission) Bacteremia likely in the setting of pancreatitis.  Resolved Recent Labs  Lab 10/28/21 0419 10/29/21 0527 10/30/21 0602 10/31/21 0400 11/01/21 0351  WBC 12.1* 11.4* 8.5 8.6 9.3    Polycythemia Likely from hemconcentration and sleep apnea related.  Resolved    Transaminitis with hyperbilirubinemia- (present on admission) Pattern consistent with alcohol or rhabdo but CK within normal.  Improved.  Mixed hyperlipidemia- (present on admission) Holding statin  Gastroesophageal reflux disease- (present on admission) Fluid within distal esophagus suggesting GERD as he reports heartburn.Cont PPI  bid.  Sleep apnea usese CPAP at home. Here cont BiPAP  Essential hypertension- (present on admission) BP stable.  Holding losartan.  Resume Cardizem for uncontrolled heart rate.     DVT prophylaxis: SCDs Start: 10/28/21 0322 heaprin gtt Code Status:   Code Status: Full Code Family Communication: plan of care discussed with patient/ his wife and daughter at bedside.  Disposition: Currently not medically stable for discharge. Status is: Inpatient Remains inpatient appropriate because: Ongoing management of respiratory failure Pancreatitis Continue to monitor in the stepdown unit high risk of decompensation.   Objective: Vitals last 24 hrs: Vitals:   11/01/21 1100 11/01/21 1200 11/01/21 1300 11/01/21 1400  BP: (!) 149/62 119/70 108/65 112/72  Pulse: (!) 113 (!) 102 (!) 105 68  Resp: (!) 24 (!) 28 (!) 27 (!) 21  Temp:      TempSrc:      SpO2: 98% 95% 92% 96%  Weight:      Height:       Weight change: 2.2 kg  Physical Examination:  General exam: AA0X3, elderly frail comfortable not in distress but mildly anxious  HEENT:Oral mucosa moist, Ear/Nose WNL grossly, dentition normal. Respiratory system: bilaterally diminished breath sounds, no use of accessory muscle Cardiovascular system: S1 & S2 +, No JVD,. Gastrointestinal system: Abdomen soft, mild tenderness in the mid abdomen, FULL,BS+ Nervous System:Alert, awake, moving extremities and grossly nonfocal Extremities: LE ankle edema  +, distal peripheral pulses palpable.  Skin: No rashes,no icterus. MSK: Normal muscle bulk,tone, power   Medications reviewed:  Scheduled Meds:  brimonidine  1 drop Right Eye BID   Chlorhexidine Gluconate Cloth  6 each Topical Daily   diltiazem  240 mg Oral Daily   metoprolol tartrate  5 mg Intravenous Q6H   pantoprazole (PROTONIX) IV  40 mg Intravenous Q12H   polyethylene glycol  17 g Oral BID   tamsulosin  0.8 mg Oral QPM   Continuous Infusions:  heparin 2,450 Units/hr (11/01/21 1429)    lactated ringers 50 mL/hr at 11/01/21 0807   meropenem (MERREM) IV       Diet Order             Diet clear liquid Room service appropriate? Yes; Fluid consistency: Thin  Diet effective now                    Intake/Output Summary (Last 24 hours) at 11/01/2021 1510 Last data filed at 11/01/2021 0900 Gross per 24 hour  Intake 1513.45 ml  Output 601 ml  Net 912.45 ml   Net IO Since Admission: 1,895.79 mL [11/01/21 1510]  Wt Readings from Last 3 Encounters:  11/01/21 125.6 kg  05/15/21 113.4 kg  04/20/21 115.8 kg     Unresulted Labs (From admission, onward)     Start     Ordered   11/02/21 0500  APTT  Daily,   R      10/31/21 1755   11/01/21 2200  APTT  Once-Timed,   TIMED        11/01/21 1337   11/01/21 0500  Heparin level (unfractionated)  Daily,   R      10/30/21 1903   11/01/21 0500  Comprehensive metabolic panel  Daily,   R      10/31/21 1426   10/29/21 0500  CBC  Daily,   R      10/28/21 1027          Data Reviewed: I have personally reviewed following labs and imaging studies CBC: Recent Labs  Lab 10/28/21 0419 10/29/21 0527 10/30/21 0602 10/31/21 0400 11/01/21 0351  WBC 12.1* 11.4* 8.5 8.6 9.3  NEUTROABS 11.0*  --   --   --   --   HGB 17.6* 17.2* 14.2 13.5 12.8*  HCT 54.0* 53.3* 44.7 40.7 38.3*  MCV 95.7 96.9 98.2 97.4 96.5  PLT 339 296 246 220 938   Basic Metabolic Panel: Recent Labs  Lab 10/28/21 0419 10/29/21 0527 10/30/21 0111 10/30/21 0602 10/31/21 0400 11/01/21 0351  NA 138 139 137 135 133* 133*  K 3.7 4.8 3.9 4.0 3.8 3.5  CL 108 111 110 110 108 106  CO2 20* 20* 17* 17* 18* 20*  GLUCOSE 176* 161* 137* 141* 135* 143*  BUN 21 38* 43* 42* 37* 20  CREATININE 0.95 1.77* 1.45* 1.33* 1.09 0.87  CALCIUM 8.7* 7.4* 6.9* 6.9* 7.4* 7.5*  MG 1.7 2.3 2.1 2.2  --   --   PHOS  --  3.6  --  2.8  --   --    GFR: Estimated Creatinine Clearance: 100.5 mL/min (by C-G formula based on SCr of 0.87 mg/dL). Liver Function Tests: Recent Labs  Lab  10/28/21 0419 10/29/21 0527 10/30/21 0602 10/31/21 0400 11/01/21 0351  AST 136* 39 24 19 21   ALT 100* 52* 30 24 22   ALKPHOS 89 62 49 47 52  BILITOT 1.6* 0.9 0.6 1.0 1.2  PROT 6.7 5.9* 5.4* 5.3* 5.0*  ALBUMIN 3.8 3.0* 2.6* 2.4* 2.2*   Recent Labs  Lab 10/27/21 2312 10/28/21  0272 10/29/21 0527 10/30/21 0602 10/31/21 0400  LIPASE 1,681* 1,678* 551* 144* 47   Recent Labs  Lab 10/29/21 0527 10/30/21 0602  AMMONIA 25 32   Coagulation Profile: No results for input(s): INR, PROTIME in the last 168 hours. Cardiac Enzymes: Recent Labs  Lab 10/28/21 0419 10/30/21 0602  CKTOTAL 71 78   BNP (last 3 results) No results for input(s): PROBNP in the last 8760 hours. HbA1C: No results for input(s): HGBA1C in the last 72 hours. CBG: No results for input(s): GLUCAP in the last 168 hours. Lipid Profile: No results for input(s): CHOL, HDL, LDLCALC, TRIG, CHOLHDL, LDLDIRECT in the last 72 hours. Thyroid Function Tests: No results for input(s): TSH, T4TOTAL, FREET4, T3FREE, THYROIDAB in the last 72 hours. Anemia Panel: No results for input(s): VITAMINB12, FOLATE, FERRITIN, TIBC, IRON, RETICCTPCT in the last 72 hours. Sepsis Labs: Recent Labs  Lab 10/29/21 1029 10/30/21 0602 10/30/21 0806 10/31/21 0400  LATICACIDVEN 3.2* 2.3* 2.9* 1.6    Recent Results (from the past 240 hour(s))  Resp Panel by RT-PCR (Flu A&B, Covid) Nasopharyngeal Swab     Status: None   Collection Time: 10/28/21  3:00 AM   Specimen: Nasopharyngeal Swab; Nasopharyngeal(NP) swabs in vial transport medium  Result Value Ref Range Status   SARS Coronavirus 2 by RT PCR NEGATIVE NEGATIVE Final    Comment: (NOTE) SARS-CoV-2 target nucleic acids are NOT DETECTED.  The SARS-CoV-2 RNA is generally detectable in upper respiratory specimens during the acute phase of infection. The lowest concentration of SARS-CoV-2 viral copies this assay can detect is 138 copies/mL. A negative result does not preclude  SARS-Cov-2 infection and should not be used as the sole basis for treatment or other patient management decisions. A negative result may occur with  improper specimen collection/handling, submission of specimen other than nasopharyngeal swab, presence of viral mutation(s) within the areas targeted by this assay, and inadequate number of viral copies(<138 copies/mL). A negative result must be combined with clinical observations, patient history, and epidemiological information. The expected result is Negative.  Fact Sheet for Patients:  EntrepreneurPulse.com.au  Fact Sheet for Healthcare Providers:  IncredibleEmployment.be  This test is no t yet approved or cleared by the [place] FDA and  has been authorized for detection and/or diagnosis of SARS-CoV-2 by FDA under an Emergency Use Authorization (EUA). This EUA will remain  in effect (meaning this test can be used) for the duration of the COVID-19 declaration under Section 564(b)(1) of the Act, 21 U.S.C.section 360bbb-3(b)(1), unless the authorization is terminated  or revoked sooner.       Influenza A by PCR NEGATIVE NEGATIVE Final   Influenza B by PCR NEGATIVE NEGATIVE Final    Comment: (NOTE) The Xpert Xpress SARS-CoV-2/FLU/RSV plus assay is intended as an aid in the diagnosis of influenza from Nasopharyngeal swab specimens and should not be used as a sole basis for treatment. Nasal washings and aspirates are unacceptable for Xpert Xpress SARS-CoV-2/FLU/RSV testing.  Fact Sheet for Patients: EntrepreneurPulse.com.au  Fact Sheet for Healthcare Providers: IncredibleEmployment.be  This test is not yet approved or cleared by the [place] FDA and has been authorized for detection and/or diagnosis of SARS-CoV-2 by FDA under an Emergency Use Authorization (EUA). This EUA will remain in effect (meaning this test can be used) for the duration of  the COVID-19 declaration under Section 564(b)(1) of the Act, 21 U.S.C. section 360bbb-3(b)(1), unless the authorization is terminated or revoked.  Performed at [facility], [address], [place], [place] [address]  MRSA Next Gen by PCR, Nasal     Status: None   Collection Time: 10/30/21  5:46 PM   Specimen: Nasal Mucosa; Nasal Swab  Result Value Ref Range Status   MRSA by PCR Next Gen NOT DETECTED NOT DETECTED Final    Comment: (NOTE) The GeneXpert MRSA Assay (FDA approved for NASAL specimens only), is one component of a comprehensive MRSA colonization surveillance program. It is not intended to diagnose MRSA infection nor to guide or monitor treatment for MRSA infections. Test performance is not FDA approved in patients less than 72 years old. Performed at [facility], [address], [place], [place] [address]     Antimicrobials: Anti-infectives (From admission, onward)    Start     Dose/Rate Route Frequency Ordered Stop   11/01/21 1600  meropenem (MERREM) 1 g in sodium chloride 0.9 % 100 mL IVPB        1 g 200 mL/hr over 30 Minutes Intravenous Every 8 hours 11/01/21 1454     10/30/21 2100  Ampicillin-Sulbactam (UNASYN) 3 g in sodium chloride 0.9 % 100 mL IVPB  Status:  Discontinued        3 g 200 mL/hr over 30 Minutes Intravenous Every 6 hours 10/30/21 2024 11/01/21 1453      Culture/Microbiology No results found for: SDES, SPECREQUEST, CULT, REPTSTATUS   Radiology Studies: CT CHEST WO CONTRAST  Result Date: 10/30/2021 CLINICAL DATA:  Pneumonia, complication suspected. Respiratory distress, hypoxia, tachypnea. EXAM: CT CHEST WITHOUT CONTRAST TECHNIQUE: Multidetector CT imaging of the chest was performed following the standard protocol without IV contrast. RADIATION DOSE REDUCTION: This exam was performed according to the departmental dose-optimization program which includes automated exposure control, adjustment of the mA and/or kV according to patient size and/or use of  iterative reconstruction technique. COMPARISON:  10/28/2021 FINDINGS: Cardiovascular: Mild aortic and coronary artery calcifications. No aortic aneurysm. Normal heart size. No pericardial effusions. Mediastinum/Nodes: Thyroid gland is unremarkable. Scattered mediastinal lymph nodes are not pathologically enlarged. Esophagus is decompressed. Lungs/Pleura: New development of bilateral basilar atelectasis or consolidation. No pleural effusions. No pneumothorax. Airways are patent. Upper Abdomen: Inflammatory changes again demonstrated in the visualized pancreas although incompletely included. There is hazy stranding around the pancreatic head and body with soft tissue gas. The gas collections are new since prior study and may represent necrotizing infection or fistula. CT abdomen and pelvis suggested for further evaluation. Musculoskeletal: Degenerative changes in the spine. IMPRESSION: 1. New development of bilateral basilar consolidation or atelectasis in the lungs. 2. Incomplete visualization of changes of pancreatitis in the upper abdomen. New finding of peripancreatic gas suggesting necrotizing infection or fistula. CT abdomen and pelvis suggested for further evaluation. 3. Aortic atherosclerosis. Electronically Signed   By: Lucienne Capers M.D.   On: 10/30/2021 20:00   ECHOCARDIOGRAM COMPLETE  Result Date: 10/31/2021    ECHOCARDIOGRAM REPORT   Patient Name:   SHADOE BETHEL Date of Exam: 10/31/2021 Medical Rec #:  [id number]      Height:       70.0 in Accession #:    [id number]     Weight:       273.1 lb Date of Birth:  [birth year]     BSA:          2.383 m Patient Age:    30 years       BP:           118/62 mmHg Patient Gender: M  HR:           113 bpm. Exam Location:  [facility] Procedure: 2D Echo, Cardiac Doppler and Color Doppler Indications:    Dyspnea  History:        Patient has prior history of Echocardiogram examinations, most                 recent 06/09/2014. Arrythmias:Atrial Fibrillation  and Atrial                 Flutter; Risk Factors:Hypertension, Dyslipidemia and Former                 Smoker.  Sonographer:    Wenda Low Referring Phys: [id number] Charlesetta Ivory GONFA  Sonographer Comments: Patient is morbidly obese. Image acquisition challenging due to respiratory motion. Patient is on Bi-Pap IMPRESSIONS  1. Left ventricular ejection fraction, by estimation, is 60 to 65%. The left ventricle has normal function. The left ventricle has no regional wall motion abnormalities. There is moderate left ventricular hypertrophy. Left ventricular diastolic parameters are indeterminate.  2. Right ventricular systolic function is normal. The right ventricular size is normal. There is normal pulmonary artery systolic pressure.  3. Left atrial size was mildly dilated.  4. Prominent epicardial adipose tissue.  5. The mitral valve is normal in structure. No evidence of mitral valve regurgitation. No evidence of mitral stenosis.  6. The aortic valve is tricuspid. There is mild calcification of the aortic valve. Aortic valve regurgitation is not visualized. Aortic valve sclerosis is present, with no evidence of aortic valve stenosis.  7. The inferior vena cava is normal in size with greater than 50% respiratory variability, suggesting right atrial pressure of 3 mmHg. FINDINGS  Left Ventricle: Left ventricular ejection fraction, by estimation, is 60 to 65%. The left ventricle has normal function. The left ventricle has no regional wall motion abnormalities. The left ventricular internal cavity size was normal in size. There is  moderate left ventricular hypertrophy. Left ventricular diastolic parameters are indeterminate. Right Ventricle: The right ventricular size is normal. No increase in right ventricular wall thickness. Right ventricular systolic function is normal. There is normal pulmonary artery systolic pressure. The tricuspid regurgitant velocity is 2.36 m/s, and  with an assumed right atrial pressure of 3  mmHg, the estimated right ventricular systolic pressure is 54.2 mmHg. Left Atrium: Left atrial size was mildly dilated. Right Atrium: Right atrial size was normal in size. Pericardium: Prominent epicardial adipose tissue. There is no evidence of pericardial effusion. Mitral Valve: The mitral valve is normal in structure. No evidence of mitral valve regurgitation. No evidence of mitral valve stenosis. MV peak gradient, 5.5 mmHg. The mean mitral valve gradient is 2.0 mmHg. Tricuspid Valve: The tricuspid valve is normal in structure. Tricuspid valve regurgitation is not demonstrated. No evidence of tricuspid stenosis. Aortic Valve: The aortic valve is tricuspid. There is mild calcification of the aortic valve. Aortic valve regurgitation is not visualized. Aortic valve sclerosis is present, with no evidence of aortic valve stenosis. Aortic valve mean gradient measures 4.5 mmHg. Aortic valve peak gradient measures 10.3 mmHg. Aortic valve area, by VTI measures 2.73 cm. Pulmonic Valve: The pulmonic valve was normal in structure. Pulmonic valve regurgitation is not visualized. No evidence of pulmonic stenosis. Aorta: The aortic root is normal in size and structure. Venous: The inferior vena cava is normal in size with greater than 50% respiratory variability, suggesting right atrial pressure of 3 mmHg. IAS/Shunts: No atrial level shunt detected by color flow Doppler.  LEFT VENTRICLE PLAX 2D LVIDd:         3.50 cm   Diastology LVIDs:         2.10 cm   LV e' medial:    15.40 cm/s LV PW:         1.20 cm   LV E/e' medial:  7.5 LV IVS:        1.50 cm   LV e' lateral:   13.30 cm/s LVOT diam:     2.10 cm   LV E/e' lateral: 8.7 LV SV:         71 LV SV Index:   30 LVOT Area:     3.46 cm  RIGHT VENTRICLE RV Basal diam:  3.55 cm RV Mid diam:    2.70 cm RV S prime:     16.43 cm/s LEFT ATRIUM             Index        RIGHT ATRIUM           Index LA diam:        4.30 cm 1.80 cm/m   RA Area:     22.00 cm LA Vol (A2C):   54.0 ml 22.66  ml/m  RA Volume:   69.50 ml  29.17 ml/m LA Vol (A4C):   69.4 ml 29.12 ml/m LA Biplane Vol: 67.5 ml 28.33 ml/m  AORTIC VALVE                     PULMONIC VALVE AV Area (Vmax):    2.72 cm      PV Vmax:       1.08 m/s AV Area (Vmean):   2.67 cm      PV Peak grad:  4.7 mmHg AV Area (VTI):     2.73 cm AV Vmax:           160.50 cm/s AV Vmean:          100.300 cm/s AV VTI:            0.260 m AV Peak Grad:      10.3 mmHg AV Mean Grad:      4.5 mmHg LVOT Vmax:         126.00 cm/s LVOT Vmean:        77.200 cm/s LVOT VTI:          0.205 m LVOT/AV VTI ratio: 0.79  AORTA Ao Root diam: 3.20 cm MITRAL VALVE                TRICUSPID VALVE MV Area (PHT): 3.17 cm     TR Peak grad:   22.3 mmHg MV Area VTI:   3.48 cm     TR Vmax:        236.00 cm/s MV Peak grad:  5.5 mmHg MV Mean grad:  2.0 mmHg     SHUNTS MV Vmax:       1.17 m/s     Systemic VTI:  0.20 m MV Vmean:      65.2 cm/s    Systemic Diam: 2.10 cm MV Decel Time: 239 msec MV E velocity: 116.00 cm/s Jenkins Rouge MD Electronically signed by Jenkins Rouge MD Signature Date/Time: 10/31/2021/10:20:51 AM    Final    CT PANCREAS ABD W/WO  Result Date: 11/01/2021 CLINICAL DATA:  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 EXAM: CT ABDOMEN WITHOUT AND WITH CONTRAST TECHNIQUE: Multidetector CT imaging of the abdomen was performed following the standard protocol before and following the bolus administration  of intravenous contrast. RADIATION DOSE REDUCTION: This exam was performed according to the departmental dose-optimization program which includes automated exposure control, adjustment of the mA and/or kV according to patient size and/or use of iterative reconstruction technique. CONTRAST:  135mL OMNIPAQUE IOHEXOL 300 MG/ML  SOLN COMPARISON:  Recent imaging from October 28, 2021. FINDINGS: Lower chest: Small bilateral effusions and basilar volume loss. Hepatobiliary: Hepatic steatosis with lobular hepatic contours. No focal, suspicious hepatic lesion. Hepatic arterial supply derive  from the celiac axis and SMA with replaced RIGHT hepatic arterial supply arising from the SMA. Gallbladder with mild surrounding stranding felt to be secondary from pancreatic and retroperitoneal process. The portal vein or remains patent as does the splenic vein. Pancreas: Marked interstitial edematous pancreatitis. Note that the pancreatic parenchyma is poorly enhancing but does enhance, scattered areas of non enhancement are noted. There is extensive surrounding inflammation. No large hematoma. There is however gas that has developed in the LEFT and RIGHT retroperitoneum greatest on the RIGHT. Gas along the celiac and about the neck of the pancreas largely sparing the pancreas proper is new compared to February 5th and incompletely imaged on the recent comparison evaluation. Spleen: Normal. Adrenals/Urinary Tract: Adrenal glands are normal. Symmetric renal enhancement without hydronephrosis. Stomach/Bowel: Duodenal inflammation persists with surrounding stranding further extending into retroperitoneal fascial planes and associated with gas as described. No signs of pneumatosis. There also very small locules of gas anterior to the duodenum (image 92/11). No pneumoperitoneum. Vascular/Lymphatic: Atherosclerotic changes of the abdominal aorta. No adenopathy in the retroperitoneum. Other: Retroperitoneal gas as described above. Greatest collection of gas along the RIGHT flank posterior to the colon measuring 5.0 x 2.9 cm. Extensive inflammation with worsening extending throughout retroperitoneal fascial planes. Extensive body wall edema. Musculoskeletal: No acute bone finding. No destructive bone process. Spinal degenerative changes. IMPRESSION: 1. 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 2. Dissemination of inflammation throughout the retroperitoneum with gas greater on the RIGHT than the LEFT. Findings are suspicious for infection of  peripancreatic necrosis, given rapid development aggressive or necrotizing infection is considered and should be correlated with the patient's clinical condition. 3. Given small locules of gas adjacent to the duodenum and the presence of gas in the retroperitoneum would consider follow-up imaging with enteric contrast media utilizing CT to exclude the possibility of a GI source of gas from bowel compromise particularly of the duodenum. 4. Hepatic steatosis with lobular hepatic contours. 5. Small bilateral effusions and basilar volume loss. 6. Aortic atherosclerosis. Aortic Atherosclerosis (ICD10-I70.0). These results will be called to the ordering clinician or representative by the Radiologist Assistant, and communication documented in the PACS or [employer]. Electronically Signed   By: Zetta Bills M.D.   On: 11/01/2021 12:11     LOS: 4 days   Antonieta Pert, MD [facility]  11/01/2021, 3:10 PM

## 2021-11-01 NOTE — Progress Notes (Signed)
Pharmacy Antibiotic Note  Sean Golden is a 76 y.o. male admitted on 10/27/2021 with  intra-abdominal infection .  Pharmacy has been consulted for meropenem dosing.  Plan: Meropenem 1000 mg IV every 8 hours. Monitor labs, c/s, and patient improvement.  Height: 5\' 10"  (177.8 cm) Weight: 125.6 kg (276 lb 14.4 oz) IBW/kg (Calculated) : 73  Temp (24hrs), Avg:98.7 F (37.1 C), Min:98.6 F (37 C), Max:98.8 F (37.1 C)  Recent Labs  Lab 10/28/21 0419 10/28/21 0847 10/29/21 0527 10/29/21 0800 10/29/21 1029 10/30/21 0111 10/30/21 0602 10/30/21 0806 10/31/21 0400 11/01/21 0351  WBC 12.1*  --  11.4*  --   --   --  8.5  --  8.6 9.3  CREATININE 0.95  --  1.77*  --   --  1.45* 1.33*  --  1.09 0.87  LATICACIDVEN  --    < >  --  2.6* 3.2*  --  2.3* 2.9* 1.6  --    < > = values in this interval not displayed.    Estimated Creatinine Clearance: 100.5 mL/min (by C-G formula based on SCr of 0.87 mg/dL).    No Known Allergies  Antimicrobials this admission: Merrem 2/9 >> Unasyn 2/7 >> 2/9  Microbiology results:  2/7 MRSA PCR: negative  Thank you for allowing pharmacy to be a part of this patients care.  Margot Ables, PharmD Clinical Pharmacist 11/01/2021 2:55 PM

## 2021-11-02 DIAGNOSIS — K8591 Acute pancreatitis with uninfected necrosis, unspecified: Principal | ICD-10-CM

## 2021-11-02 LAB — COMPREHENSIVE METABOLIC PANEL
ALT: 22 U/L (ref 0–44)
AST: 23 U/L (ref 15–41)
Albumin: 2.1 g/dL — ABNORMAL LOW (ref 3.5–5.0)
Alkaline Phosphatase: 54 U/L (ref 38–126)
Anion gap: 9 (ref 5–15)
BUN: 15 mg/dL (ref 8–23)
CO2: 20 mmol/L — ABNORMAL LOW (ref 22–32)
Calcium: 7.6 mg/dL — ABNORMAL LOW (ref 8.9–10.3)
Chloride: 105 mmol/L (ref 98–111)
Creatinine, Ser: 0.85 mg/dL (ref 0.61–1.24)
GFR, Estimated: >60 mL/min (ref >60)
Glucose, Bld: 117 mg/dL — ABNORMAL HIGH (ref 70–99)
Potassium: 3.2 mmol/L — ABNORMAL LOW (ref 3.5–5.1)
Sodium: 134 mmol/L — ABNORMAL LOW (ref 135–145)
Total Bilirubin: 1.4 mg/dL — ABNORMAL HIGH (ref 0.3–1.2)
Total Protein: 4.9 g/dL — ABNORMAL LOW (ref 6.5–8.1)

## 2021-11-02 LAB — CBC
HCT: 37.6 % — ABNORMAL LOW (ref 39.0–52.0)
Hemoglobin: 12.5 g/dL — ABNORMAL LOW (ref 13.0–17.0)
MCH: 32.1 pg (ref 26.0–34.0)
MCHC: 33.2 g/dL (ref 30.0–36.0)
MCV: 96.4 fL (ref 80.0–100.0)
Platelets: 212 10*3/uL (ref 150–400)
RBC: 3.9 MIL/uL — ABNORMAL LOW (ref 4.22–5.81)
RDW: 13.3 % (ref 11.5–15.5)
WBC: 13.5 10*3/uL — ABNORMAL HIGH (ref 4.0–10.5)
nRBC: 0.1 % (ref 0.0–0.2)

## 2021-11-02 LAB — HEPARIN LEVEL (UNFRACTIONATED): Heparin Unfractionated: 0.35 IU/mL (ref 0.30–0.70)

## 2021-11-02 LAB — APTT: aPTT: 74 seconds — ABNORMAL HIGH (ref 24–36)

## 2021-11-02 MED ORDER — SALINE SPRAY 0.65 % NA SOLN
1.0000 | NASAL | Status: DC | PRN
  Administered 2021-11-02: 1 via NASAL
  Filled 2021-11-02: qty 44

## 2021-11-02 MED ORDER — BACLOFEN 10 MG PO TABS
5.0000 mg | ORAL_TABLET | Freq: Three times a day (TID) | ORAL | Status: AC
  Administered 2021-11-02 – 2021-11-05 (×9): 5 mg via ORAL
  Filled 2021-11-02 (×9): qty 1

## 2021-11-02 MED ORDER — METOPROLOL TARTRATE 5 MG/5ML IV SOLN: 5.0000 mg | INTRAVENOUS | Status: DC | PRN

## 2021-11-02 MED ORDER — POTASSIUM CHLORIDE CRYS ER 20 MEQ PO TBCR
40.0000 meq | EXTENDED_RELEASE_TABLET | Freq: Once | ORAL | Status: AC
  Administered 2021-11-02: 40 meq via ORAL
  Filled 2021-11-02: qty 2

## 2021-11-02 NOTE — Progress Notes (Signed)
PROGRESS NOTE Sean Golden  VHQ:[id number] DOB: 1945-12-29 DOA: 10/27/2021 PCP: Celene Squibb, MD   Brief Narrative/Hospital Course: Sean Golden, 76 y.o. male 76 year old M with PMH of A-fib on Eliquis, HTN, GERD/erosive esophagitis, H. Pylori and OSA presenting with acute onset cramping epigastric abdominal pain radiating to his right abdomen after he had a dinner, and admitted for acute pancreatitis.  Lipase elevated to 1680.  Mild LFT elevation with pattern consistent with EtOH or rhabdo but CK within normal.  Patient admits to drinking 2 to 3 glasses of wine before bedtime.  CTA C/A/P concerning for acute pancreatitis without necrosis or pseudocyst.  CT also showed fluid distention of the stomach and esophagus concerning for GOO or an GERD.  Started on IV fluid, IV analgesics and antiemetics, and admitted.  GI consulted.    RUQ [place] negative for gallstone or signs of acute cholecystitis but hepatic steatosis.  Pancreatitis improving clinically and chemically.  However, patient developed respiratory distress with hypoxia requiring supplemental oxygen likely from atelectasis and IV fluid.  IV fluid discontinued.  Started on IV Lasix. CT chest 2/7-New development of bilateral basilar consolidation or atelectasis in the lungs, New finding of peripancreatic gas suggesting necrotizing infection or fistula.     Subjective: Seen and examined, wife and family at the bedside Patient appears upbeat today alert awake oriented interactive Denies any new complaint. Overnight afebrile heart rate in 90s this morning, oxygen down to 3 L nasal cannula saturating well mild tachypnea in 20s Labs this morning with hyperkalemia hypoalbuminemia, leukocytosis Charted BM x1 and UOP 1050.  Assessment and Plan: * Acute necrotizing pancreatitis- (present on admission) CT chest 2/7-showed new finding of peripancreatic edema suggestive of necrotizing infection or fistula-started on Unasyn 2/7>  CT pancreas done  2/9-discussed with GI-showing signs of severe pancreatitis also some locules of gas adjacent to the duodenum-as well as other findings see the report, antibiotic changed to meropenem, underwent CT of the abd w/ po contrast-2/9 no obvious perforation or source of gas from at this time-GI following.  He clinically appears stable, continue on clear liquid diet advance as tolerated as per GI, continue to monitor electrolytes CBC LFTs lipase.  Continue pain control.  So far having good urine output, stable labs and improving respiratory status.  Continue to monitor closely at risk of decompensation. On IV Dilaudid, IV PPI. Cont OOB/PT OT and supportive care gentle IV fluid hydration  Acute respiratory failure with hypoxia (HCC) Multifactorial in the setting of severe pancreatitis, morbid obesity with sleep apnea,volume resuscitation.CT chest 2/7 bilateral basilar consolidation or atelectasis.  Status post Lasix x2 2/7.BNP slightly elevated.Echo shows EF 60 to 65%, no RWMA, moderate LVH indeterminate diastolic parameters, RV systolic function normal.Patient remains on anticoagulation and low suspicion of VTE.  Much better hypoxia, now only on 3 L nasal cannula.  We will continue to provide supplemental oxygen OOB, PT OT incentive spirometry.  Trace leg edema hold off on Lasix for now unless very short of breath. High risk of respiratory decompensation, continue in ICU with/stepdown.Net IO Since Admission: 2,013.67 mL [11/02/21 0743]  Filed Weights   10/31/21 0500 11/01/21 0545 11/02/21 0500  Weight: 123.9 kg 125.6 kg 413.2 kg     Metabolic acidosis Bicarb is stable.  Monitor  AKI (acute kidney injury) ([place]) Resolved.Suspect multifactorial in the setting of pancreatitis dehydration.Continue Flomax, no retention IN bladder scan urine output fairly okay- Intake/Output Summary (Last 24 hours) at 11/02/2021 0746 Last data filed at 11/02/2021 0038 Gross per 24  hour  Intake 2041.33 ml  Output 1051 ml  Net  990.33 ml   Recent Labs  Lab 10/30/21 0111 10/30/21 0602 10/31/21 0400 11/01/21 0351 11/02/21 0309  BUN 43* 42* 37* 20 15  CREATININE 1.45* 1.33* 1.09 0.87 0.85    Lactic acidosis In the setting of pancreatitis and dehydration.  Resolved.   Recent Labs  Lab 10/30/21 0602 10/30/21 0806 10/31/21 0400  LATICACIDVEN 2.3* 2.9* 1.6     Hypokalemia- (present on admission) We will replete. Recent Labs  Lab 10/30/21 0111 10/30/21 0602 10/31/21 0400 11/01/21 0351 11/02/21 0309  K 3.9 4.0 3.8 3.5 3.2*    Paroxysmal atrial fibrillation (HCC)- (present on admission) Rate now controlled after resuming Cardizem 240 mg in the morning.  If remains poorly controlled can also add his home dose of Cardizem 120 in the evening,Cont to IV Lopressor for HR >130. Remains on iv heparin since admission.  Continue on telemetry. Monitor electrolytes.   Obesity with sleep apnea Will benefit with PCP follow-up, weight loss.  On CPAP at home.  Currently needing BiPAP here bedtime.  Bandemia- (present on admission) Bandemia likely in the setting of pancreatitis.  Slightly up this morning.  Monitor on antibiotics already Recent Labs  Lab 10/28/21 0419 10/29/21 0527 10/30/21 0602 10/31/21 0400 11/01/21 0351  WBC 12.1* 11.4* 8.5 8.6 9.3    Polycythemia Likely from hemconcentration and sleep apnea related.  Resolved    Transaminitis with hyperbilirubinemia- (present on admission) Pattern consistent with alcohol or rhabdo but CK within normal.  Improved.  Mixed hyperlipidemia- (present on admission) Holding statin  Gastroesophageal reflux disease- (present on admission) Fluid within distal esophagus suggesting GERD as he reports heartburn.Cont PPI bid.  Sleep apnea usese CPAP at home. Here cont BiPAP  Essential hypertension- (present on admission) BP stable.  Holding losartan.  Resume Cardizem for uncontrolled heart rate.     DVT prophylaxis: SCDs Start: 10/28/21 0322 heaprin  gtt Code Status:   Code Status: Full Code Family Communication: plan of care discussed with patient/ his wife and family at the bedside.   Disposition: Currently not medically stable for discharge. Status is: Inpatient, stepdown unit. Remains inpatient appropriate because: Ongoing management of respiratory failure Pancreatitis.Continue to monitor in the stepdown unit high risk of decompensation.   Objective: Vitals last 24 hrs: Vitals:   11/02/21 0300 11/02/21 0400 11/02/21 0500 11/02/21 0600  BP:  128/76  114/77  Pulse: (!) 169 (!) 56 (!) 103 98  Resp: (!) 25 (!) 23 20 (!) 22  Temp:      TempSrc:      SpO2: 96% 95% 96% 94%  Weight:   124.3 kg   Height:       Weight change: -1.3 kg  Physical Examination: General exam: AA0x3, older than stated age, weak appearing. HEENT:Oral mucosa moist, Ear/Nose WNL grossly, dentition normal. Respiratory system: bilaterally diminished with basal crackles, no use of accessory muscle Cardiovascular system: S1 & S2 +, No JVD,. Gastrointestinal system: Abdomen soft, mildly tender centrally, moderately distended versus obese,BS+ Nervous System:Alert, awake, moving extremities and grossly nonfocal Extremities: LE ankle edema mild, distal peripheral pulses palpable.  Skin: No rashes,no icterus. MSK: Normal muscle bulk,tone, power    Medications reviewed:  Scheduled Meds:  brimonidine  1 drop Right Eye BID   Chlorhexidine Gluconate Cloth  6 each Topical Daily   diltiazem  240 mg Oral Daily   pantoprazole (PROTONIX) IV  40 mg Intravenous Q12H   polyethylene glycol  17 g Oral BID  potassium chloride  40 mEq Oral Once   tamsulosin  0.8 mg Oral QPM   Continuous Infusions:  heparin 2,600 Units/hr (11/02/21 0038)   lactated ringers 50 mL/hr at 11/01/21 1838   meropenem (MERREM) IV 1 g (11/01/21 2255)     Diet Order             Diet clear liquid Room service appropriate? Yes; Fluid consistency: Thin  Diet effective now                     Intake/Output Summary (Last 24 hours) at 11/02/2021 0911 Last data filed at 11/02/2021 0038 Gross per 24 hour  Intake 767.88 ml  Output 650 ml  Net 117.88 ml   Net IO Since Admission: 2,013.67 mL [11/02/21 0911]  Wt Readings from Last 3 Encounters:  11/02/21 124.3 kg  05/15/21 113.4 kg  04/20/21 115.8 kg     Unresulted Labs (From admission, onward)     Start     Ordered   11/01/21 0500  Heparin level (unfractionated)  Daily,   R      10/30/21 1903   11/01/21 0500  Comprehensive metabolic panel  Daily,   R      10/31/21 1426   10/29/21 0500  CBC  Daily,   R      10/28/21 1027          Data Reviewed: I have personally reviewed following labs and imaging studies CBC: Recent Labs  Lab 10/28/21 0419 10/29/21 0527 10/30/21 0602 10/31/21 0400 11/01/21 0351 11/02/21 0309  WBC 12.1* 11.4* 8.5 8.6 9.3 13.5*  NEUTROABS 11.0*  --   --   --   --   --   HGB 17.6* 17.2* 14.2 13.5 12.8* 12.5*  HCT 54.0* 53.3* 44.7 40.7 38.3* 37.6*  MCV 95.7 96.9 98.2 97.4 96.5 96.4  PLT 339 296 246 220 222 485   Basic Metabolic Panel: Recent Labs  Lab 10/28/21 0419 10/29/21 0527 10/30/21 0111 10/30/21 0602 10/31/21 0400 11/01/21 0351 11/02/21 0309  NA 138 139 137 135 133* 133* 134*  K 3.7 4.8 3.9 4.0 3.8 3.5 3.2*  CL 108 111 110 110 108 106 105  CO2 20* 20* 17* 17* 18* 20* 20*  GLUCOSE 176* 161* 137* 141* 135* 143* 117*  BUN 21 38* 43* 42* 37* 20 15  CREATININE 0.95 1.77* 1.45* 1.33* 1.09 0.87 0.85  CALCIUM 8.7* 7.4* 6.9* 6.9* 7.4* 7.5* 7.6*  MG 1.7 2.3 2.1 2.2  --   --   --   PHOS  --  3.6  --  2.8  --   --   --    GFR: Estimated Creatinine Clearance: 102.4 mL/min (by C-G formula based on SCr of 0.85 mg/dL). Liver Function Tests: Recent Labs  Lab 10/29/21 0527 10/30/21 0602 10/31/21 0400 11/01/21 0351 11/02/21 0309  AST 39 24 19 21 23   ALT 52* 30 24 22 22   ALKPHOS 62 49 47 52 54  BILITOT 0.9 0.6 1.0 1.2 1.4*  PROT 5.9* 5.4* 5.3* 5.0* 4.9*  ALBUMIN 3.0* 2.6* 2.4*  2.2* 2.1*   Recent Labs  Lab 10/27/21 2312 10/28/21 0419 10/29/21 0527 10/30/21 0602 10/31/21 0400  LIPASE 1,681* 1,678* 551* 144* 47   Recent Labs  Lab 10/29/21 0527 10/30/21 0602  AMMONIA 25 32   Coagulation Profile: No results for input(s): INR, PROTIME in the last 168 hours. Cardiac Enzymes: Recent Labs  Lab 10/28/21 0419 10/30/21 0602  CKTOTAL 71 78  BNP (last 3 results) No results for input(s): PROBNP in the last 8760 hours. HbA1C: No results for input(s): HGBA1C in the last 72 hours. CBG: No results for input(s): GLUCAP in the last 168 hours. Lipid Profile: No results for input(s): CHOL, HDL, LDLCALC, TRIG, CHOLHDL, LDLDIRECT in the last 72 hours. Thyroid Function Tests: No results for input(s): TSH, T4TOTAL, FREET4, T3FREE, THYROIDAB in the last 72 hours. Anemia Panel: No results for input(s): VITAMINB12, FOLATE, FERRITIN, TIBC, IRON, RETICCTPCT in the last 72 hours. Sepsis Labs: Recent Labs  Lab 10/29/21 1029 10/30/21 0602 10/30/21 0806 10/31/21 0400  LATICACIDVEN 3.2* 2.3* 2.9* 1.6    Recent Results (from the past 240 hour(s))  Resp Panel by RT-PCR (Flu A&B, Covid) Nasopharyngeal Swab     Status: None   Collection Time: 10/28/21  3:00 AM   Specimen: Nasopharyngeal Swab; Nasopharyngeal(NP) swabs in vial transport medium  Result Value Ref Range Status   SARS Coronavirus 2 by RT PCR NEGATIVE NEGATIVE Final    Comment: (NOTE) SARS-CoV-2 target nucleic acids are NOT DETECTED.  The SARS-CoV-2 RNA is generally detectable in upper respiratory specimens during the acute phase of infection. The lowest concentration of SARS-CoV-2 viral copies this assay can detect is 138 copies/mL. A negative result does not preclude SARS-Cov-2 infection and should not be used as the sole basis for treatment or other patient management decisions. A negative result may occur with  improper specimen collection/handling, submission of specimen other than nasopharyngeal  swab, presence of viral mutation(s) within the areas targeted by this assay, and inadequate number of viral copies(<138 copies/mL). A negative result must be combined with clinical observations, patient history, and epidemiological information. The expected result is Negative.  Fact Sheet for Patients:  EntrepreneurPulse.com.au  Fact Sheet for Healthcare Providers:  IncredibleEmployment.be  This test is no t yet approved or cleared by the [place] FDA and  has been authorized for detection and/or diagnosis of SARS-CoV-2 by FDA under an Emergency Use Authorization (EUA). This EUA will remain  in effect (meaning this test can be used) for the duration of the COVID-19 declaration under Section 564(b)(1) of the Act, 21 U.S.C.section 360bbb-3(b)(1), unless the authorization is terminated  or revoked sooner.       Influenza A by PCR NEGATIVE NEGATIVE Final   Influenza B by PCR NEGATIVE NEGATIVE Final    Comment: (NOTE) The Xpert Xpress SARS-CoV-2/FLU/RSV plus assay is intended as an aid in the diagnosis of influenza from Nasopharyngeal swab specimens and should not be used as a sole basis for treatment. Nasal washings and aspirates are unacceptable for Xpert Xpress SARS-CoV-2/FLU/RSV testing.  Fact Sheet for Patients: EntrepreneurPulse.com.au  Fact Sheet for Healthcare Providers: IncredibleEmployment.be  This test is not yet approved or cleared by the [place] FDA and has been authorized for detection and/or diagnosis of SARS-CoV-2 by FDA under an Emergency Use Authorization (EUA). This EUA will remain in effect (meaning this test can be used) for the duration of the COVID-19 declaration under Section 564(b)(1) of the Act, 21 U.S.C. section 360bbb-3(b)(1), unless the authorization is terminated or revoked.  Performed at [facility], [address] [place], [place] [address]   MRSA Next Gen by  PCR, Nasal     Status: None   Collection Time: 10/30/21  5:46 PM   Specimen: Nasal Mucosa; Nasal Swab  Result Value Ref Range Status   MRSA by PCR Next Gen NOT DETECTED NOT DETECTED Final    Comment: (NOTE) The GeneXpert MRSA Assay (FDA approved for  NASAL specimens only), is one component of a comprehensive MRSA colonization surveillance program. It is not intended to diagnose MRSA infection nor to guide or monitor treatment for MRSA infections. Test performance is not FDA approved in patients less than 28 years old. Performed at [facility], [address], [place], [place] [address]     Antimicrobials: Anti-infectives (From admission, onward)    Start     Dose/Rate Route Frequency Ordered Stop   11/01/21 1600  meropenem (MERREM) 1 g in sodium chloride 0.9 % 100 mL IVPB        1 g 200 mL/hr over 30 Minutes Intravenous Every 8 hours 11/01/21 1454     10/30/21 2100  Ampicillin-Sulbactam (UNASYN) 3 g in sodium chloride 0.9 % 100 mL IVPB  Status:  Discontinued        3 g 200 mL/hr over 30 Minutes Intravenous Every 6 hours 10/30/21 2024 11/01/21 1453      Culture/Microbiology No results found for: SDES, [place], CULT, REPTSTATUS   Radiology Studies: CT ABDOMEN LIMITED WO CONTRAST  Result Date: 11/01/2021 CLINICAL DATA:  Necrotizing pancreatitis, foci of gas in pancreatic bed and in anterior pararenal spaces bilaterally greater on RIGHT, question due to necrotizing pancreatitis versus bowel/duodenal perforation EXAM: CT ABDOMEN WITHOUT CONTRAST LIMITED TECHNIQUE: Multidetector CT imaging of the abdomen was performed following the standard protocol without IV contrast. Patient drank water-soluble contrast for this exam. RADIATION DOSE REDUCTION: This exam was performed according to the departmental dose-optimization program which includes automated exposure control, adjustment of the mA and/or kV according to patient size and/or use of iterative reconstruction technique. COMPARISON:   Earlier study 11/01/2021 FINDINGS: Lower chest: Bibasilar pleural effusions and atelectasis Hepatobiliary: Gallbladder and liver normal appearance Pancreas: Significantly enlarged and edematous pancreas with diffuse infiltration of peripancreatic fat planes consistent with acute pancreatitis. Few foci of gas again seen at the pancreatic head/body region. No abnormal fluid collections. No mass or definite hemorrhage. Spleen: Normal appearance Adrenals/Urinary Tract: Adrenal glands, kidneys, and proximal ureters unremarkable. Stomach/Bowel: Contrast opacifies the stomach, duodenal bulb, proximal descending duodenum, and third portion of duodenum as well as multiple jejunal loops. Bowel wall thickening of second and third portions of duodenum. No contrast extravasation is identified into the peripancreatic tissue planes or the anterior pararenal space to suggest duodenal perforation/ulcer. Diverticulosis of descending and distal transverse colon noted. Vascular/Lymphatic: Atherosclerotic calcifications aorta. No adenopathy. Other: No free intraperitoneal air.  No hernia. Musculoskeletal: Osseous structures unremarkable. IMPRESSION: No extravasation of GI contrast into pancreatic bed or anterior pararenal spaces to suggest duodenal perforation. Persistent visualization of edema and foci of gas at the pancreatic head/body and in the anterior pararenal spaces bilaterally greater on RIGHT, likely reflecting necrotizing pancreatitis. Severe pancreatitis changes again seen. Electronically Signed   By: Lavonia Dana M.D.   On: 11/01/2021 16:38   ECHOCARDIOGRAM COMPLETE  Result Date: 10/31/2021    ECHOCARDIOGRAM REPORT   Patient Name:   Sean Golden Date of Exam: 10/31/2021 Medical Rec #:  [id number]      Height:       70.0 in Accession #:    [id number]     Weight:       273.1 lb Date of Birth:  03-19-1946     BSA:          2.383 m Patient Age:    33 years       BP:           118/62 mmHg Patient Gender: M  HR:            113 bpm. Exam Location:  [facility] Procedure: 2D Echo, Cardiac Doppler and Color Doppler Indications:    Dyspnea  History:        Patient has prior history of Echocardiogram examinations, most                 recent 06/09/2014. Arrythmias:Atrial Fibrillation and Atrial                 Flutter; Risk Factors:Hypertension, Dyslipidemia and Former                 Smoker.  Sonographer:    Wenda Low Referring Phys: [id number] Charlesetta Ivory GONFA  Sonographer Comments: Patient is morbidly obese. Image acquisition challenging due to respiratory motion. Patient is on Bi-Pap IMPRESSIONS  1. Left ventricular ejection fraction, by estimation, is 60 to 65%. The left ventricle has normal function. The left ventricle has no regional wall motion abnormalities. There is moderate left ventricular hypertrophy. Left ventricular diastolic parameters are indeterminate.  2. Right ventricular systolic function is normal. The right ventricular size is normal. There is normal pulmonary artery systolic pressure.  3. Left atrial size was mildly dilated.  4. Prominent epicardial adipose tissue.  5. The mitral valve is normal in structure. No evidence of mitral valve regurgitation. No evidence of mitral stenosis.  6. The aortic valve is tricuspid. There is mild calcification of the aortic valve. Aortic valve regurgitation is not visualized. Aortic valve sclerosis is present, with no evidence of aortic valve stenosis.  7. The inferior vena cava is normal in size with greater than 50% respiratory variability, suggesting right atrial pressure of 3 mmHg. FINDINGS  Left Ventricle: Left ventricular ejection fraction, by estimation, is 60 to 65%. The left ventricle has normal function. The left ventricle has no regional wall motion abnormalities. The left ventricular internal cavity size was normal in size. There is  moderate left ventricular hypertrophy. Left ventricular diastolic parameters are indeterminate. Right Ventricle: The right ventricular  size is normal. No increase in right ventricular wall thickness. Right ventricular systolic function is normal. There is normal pulmonary artery systolic pressure. The tricuspid regurgitant velocity is 2.36 m/s, and  with an assumed right atrial pressure of 3 mmHg, the estimated right ventricular systolic pressure is 69.6 mmHg. Left Atrium: Left atrial size was mildly dilated. Right Atrium: Right atrial size was normal in size. Pericardium: Prominent epicardial adipose tissue. There is no evidence of pericardial effusion. Mitral Valve: The mitral valve is normal in structure. No evidence of mitral valve regurgitation. No evidence of mitral valve stenosis. MV peak gradient, 5.5 mmHg. The mean mitral valve gradient is 2.0 mmHg. Tricuspid Valve: The tricuspid valve is normal in structure. Tricuspid valve regurgitation is not demonstrated. No evidence of tricuspid stenosis. Aortic Valve: The aortic valve is tricuspid. There is mild calcification of the aortic valve. Aortic valve regurgitation is not visualized. Aortic valve sclerosis is present, with no evidence of aortic valve stenosis. Aortic valve mean gradient measures 4.5 mmHg. Aortic valve peak gradient measures 10.3 mmHg. Aortic valve area, by VTI measures 2.73 cm. Pulmonic Valve: The pulmonic valve was normal in structure. Pulmonic valve regurgitation is not visualized. No evidence of pulmonic stenosis. Aorta: The aortic root is normal in size and structure. Venous: The inferior vena cava is normal in size with greater than 50% respiratory variability, suggesting right atrial pressure of 3 mmHg. IAS/Shunts: No atrial level shunt detected by color flow Doppler.  LEFT VENTRICLE PLAX 2D LVIDd:         3.50 cm   Diastology LVIDs:         2.10 cm   LV e' medial:    15.40 cm/s LV PW:         1.20 cm   LV E/e' medial:  7.5 LV IVS:        1.50 cm   LV e' lateral:   13.30 cm/s LVOT diam:     2.10 cm   LV E/e' lateral: 8.7 LV SV:         71 LV SV Index:   30 LVOT Area:      3.46 cm  RIGHT VENTRICLE RV Basal diam:  3.55 cm RV Mid diam:    2.70 cm RV S prime:     16.43 cm/s LEFT ATRIUM             Index        RIGHT ATRIUM           Index LA diam:        4.30 cm 1.80 cm/m   RA Area:     22.00 cm LA Vol (A2C):   54.0 ml 22.66 ml/m  RA Volume:   69.50 ml  29.17 ml/m LA Vol (A4C):   69.4 ml 29.12 ml/m LA Biplane Vol: 67.5 ml 28.33 ml/m  AORTIC VALVE                     PULMONIC VALVE AV Area (Vmax):    2.72 cm      PV Vmax:       1.08 m/s AV Area (Vmean):   2.67 cm      PV Peak grad:  4.7 mmHg AV Area (VTI):     2.73 cm AV Vmax:           160.50 cm/s AV Vmean:          100.300 cm/s AV VTI:            0.260 m AV Peak Grad:      10.3 mmHg AV Mean Grad:      4.5 mmHg LVOT Vmax:         126.00 cm/s LVOT Vmean:        77.200 cm/s LVOT VTI:          0.205 m LVOT/AV VTI ratio: 0.79  AORTA Ao Root diam: 3.20 cm MITRAL VALVE                TRICUSPID VALVE MV Area (PHT): 3.17 cm     TR Peak grad:   22.3 mmHg MV Area VTI:   3.48 cm     TR Vmax:        236.00 cm/s MV Peak grad:  5.5 mmHg MV Mean grad:  2.0 mmHg     SHUNTS MV Vmax:       1.17 m/s     Systemic VTI:  0.20 m MV Vmean:      65.2 cm/s    Systemic Diam: 2.10 cm MV Decel Time: 239 msec MV E velocity: 116.00 cm/s Jenkins Rouge MD Electronically signed by Jenkins Rouge MD Signature Date/Time: 10/31/2021/10:20:51 AM    Final    CT PANCREAS ABD W/WO  Result Date: 11/01/2021 CLINICAL DATA:  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 EXAM: CT ABDOMEN WITHOUT AND WITH CONTRAST TECHNIQUE: Multidetector CT imaging of the abdomen was performed following the standard protocol before and following the bolus administration  of intravenous contrast. RADIATION DOSE REDUCTION: This exam was performed according to the departmental dose-optimization program which includes automated exposure control, adjustment of the mA and/or kV according to patient size and/or use of iterative reconstruction technique. CONTRAST:  144mL OMNIPAQUE IOHEXOL 300 MG/ML  SOLN  COMPARISON:  Recent imaging from October 28, 2021. FINDINGS: Lower chest: Small bilateral effusions and basilar volume loss. Hepatobiliary: Hepatic steatosis with lobular hepatic contours. No focal, suspicious hepatic lesion. Hepatic arterial supply derive from the celiac axis and SMA with replaced RIGHT hepatic arterial supply arising from the SMA. Gallbladder with mild surrounding stranding felt to be secondary from pancreatic and retroperitoneal process. The portal vein or remains patent as does the splenic vein. Pancreas: Marked interstitial edematous pancreatitis. Note that the pancreatic parenchyma is poorly enhancing but does enhance, scattered areas of non enhancement are noted. There is extensive surrounding inflammation. No large hematoma. There is however gas that has developed in the LEFT and RIGHT retroperitoneum greatest on the RIGHT. Gas along the celiac and about the neck of the pancreas largely sparing the pancreas proper is new compared to February 5th and incompletely imaged on the recent comparison evaluation. Spleen: Normal. Adrenals/Urinary Tract: Adrenal glands are normal. Symmetric renal enhancement without hydronephrosis. Stomach/Bowel: Duodenal inflammation persists with surrounding stranding further extending into retroperitoneal fascial planes and associated with gas as described. No signs of pneumatosis. There also very small locules of gas anterior to the duodenum (image 92/11). No pneumoperitoneum. Vascular/Lymphatic: Atherosclerotic changes of the abdominal aorta. No adenopathy in the retroperitoneum. Other: Retroperitoneal gas as described above. Greatest collection of gas along the RIGHT flank posterior to the colon measuring 5.0 x 2.9 cm. Extensive inflammation with worsening extending throughout retroperitoneal fascial planes. Extensive body wall edema. Musculoskeletal: No acute bone finding. No destructive bone process. Spinal degenerative changes. IMPRESSION: 1. Signs of  worsening pancreatitis with suspected areas of glandular necrosis and signs of peripancreatic necrosis. 2. Dissemination of inflammation throughout the retroperitoneum with gas greater on the RIGHT than the LEFT. Findings are suspicious for infection of peripancreatic necrosis, given rapid development aggressive or necrotizing infection is considered and should be correlated with the patient's clinical condition. 3. Given small locules of gas adjacent to the duodenum and the presence of gas in the retroperitoneum would consider follow-up imaging with enteric contrast media utilizing CT to exclude the possibility of a GI source of gas from bowel compromise particularly of the duodenum. 4. Hepatic steatosis with lobular hepatic contours. 5. Small bilateral effusions and basilar volume loss. 6. Aortic atherosclerosis. Aortic Atherosclerosis (ICD10-I70.0). These results will be called to the ordering clinician or representative by the Radiologist Assistant, and communication documented in the PACS or [employer]. Electronically Signed   By: Zetta Bills M.D.   On: 11/01/2021 12:11     LOS: 5 days   Antonieta Pert, MD [facility]  11/02/2021, 9:11 AM

## 2021-11-02 NOTE — TOC Initial Note (Signed)
Transition of Care [facility]) - Initial/Assessment Note    Patient Details  Name: Sean Golden MRN: [id number] Date of Birth: 22-Jan-1946  Transition of Care [facility]) CM/SW Contact:    Iona Beard, [place] Phone Number: 11/02/2021, 1:30 PM  Clinical Narrative:                 CSW met with pt and spouse in room to complete assessment. Pt states that he is independent in completing his ADLs and drives when needed. Pt states that he has not had HH in the past. Pt has a cane and walker to use if needed for ambulating. CSW spoke with pt and spouse about interest in [facility] services. Family is requesting [facility] services as they know one of the PT. CSW reached out to [place] with [facility] who states they can accept pt for [facility] services. CSW to request [facility] PT and RN orders. TOC to follow.   Expected Discharge Plan: [place] Barriers to Discharge: Continued Medical Work up   Patient Goals and CMS Choice Patient states their goals for this hospitalization and ongoing recovery are:: Home with [facility] CMS Medicare.gov Compare Post Acute Care list provided to:: Patient Choice offered to / list presented to : Patient, Spouse  Expected Discharge Plan and Services Expected Discharge Plan: [place] In-house Referral: Clinical Social Work Discharge Planning Services: CM Consult Post Acute Care Choice: [place] arrangements for the past 2 months: [place] Arranged: RN, PT [place] Agency: [place] Date [place]: 11/02/21   Representative spoke with at [place]: Marjory Lies  Prior Living Arrangements/Services Living arrangements for the past 2 months: [place] Lives with:: Spouse Patient language and need for interpreter reviewed:: Yes Do you feel safe going back to the place where you live?: Yes      Need for Family Participation in Patient Care: No (Comment) Care giver support system  in place?: Yes (comment) Current home services: DME Criminal Activity/Legal Involvement Pertinent to Current Situation/Hospitalization: No - Comment as needed  Activities of Daily Living Home Assistive Devices/Equipment: None ADL Screening (condition at time of admission) Patient's cognitive ability adequate to safely complete daily activities?: Yes Is the patient deaf or have difficulty hearing?: No Does the patient have difficulty seeing, even when wearing glasses/contacts?: No Does the patient have difficulty concentrating, remembering, or making decisions?: No Patient able to express need for assistance with ADLs?: No Does the patient have difficulty dressing or bathing?: No Independently performs ADLs?: Yes (appropriate for developmental age) Communication: Independent Dressing (OT): Independent Grooming: Independent Feeding: Independent Bathing: Independent Toileting: Independent In/Out Bed: Independent Walks in Home: Independent Does the patient have difficulty walking or climbing stairs?: Yes Weakness of Legs: Both Weakness of Arms/Hands: None  Permission Sought/Granted                  Emotional Assessment Appearance:: Appears stated age Attitude/Demeanor/Rapport: Engaged Affect (typically observed): Accepting Orientation: : Oriented to Self, Oriented to Place, Oriented to  Time, Oriented to Situation Alcohol / Substance Use: Not Applicable Psych Involvement: No (comment)  Admission diagnosis:  Acute pancreatitis [K85.90] Patient Active Problem List   Diagnosis Date Noted   SOB (shortness of breath)    Acute pancreatitis    Epigastric pain    Metabolic  acidosis 10/30/2021   AKI (acute kidney injury) ([place]) 10/29/2021   Acute necrotizing pancreatitis 10/28/2021   Hypokalemia 10/28/2021   Transaminitis with hyperbilirubinemia 10/28/2021   Lactic acidosis 10/28/2021   Polycythemia 10/28/2021   Bandemia 10/28/2021   Obesity with sleep apnea 10/28/2021    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 09/16/2021   Impaired fasting glucose 07/23/2021   Mixed hyperlipidemia 07/23/2021   COVID-19 04/30/2021   Acute respiratory failure with hypoxia ([place]) 10/27/2019   Elevated glucose 67/67/2094   Helicobacter pylori gastritis 12/06/2014   History of colonic polyps 12/06/2014   Rectal bleeding 08/24/2014   Gastroesophageal reflux disease 08/24/2014   Paroxysmal atrial fibrillation ([place]) 05/27/2014   Aortic root enlargement ([place]) 05/27/2014   Sleep apnea 05/27/2014   Atrial flutter ([place]) 05/18/2009   FATIGUE / MALAISE 05/18/2009   Essential hypertension 05/09/2009   PCP:  Celene Squibb, MD Pharmacy:   [facility], [place] [address] Phone: [phone] Fax: [phone]  [facility] [phone] - [place], [place] [address] [place] [address] [place] [address] Phone: [phone] Fax: [phone]     Social Determinants of Health ([place]) Interventions    Readmission Risk Interventions Readmission Risk Prevention Plan 11/02/2021  Transportation Screening Complete  Home Care Screening Complete  Medication Review (RN CM) Complete  Some recent data might be hidden

## 2021-11-02 NOTE — Progress Notes (Signed)
Subjective:    Patient states his breathing continues to improve. He used nasal cannula last night but plans to use his CPAP from home tonight. States his abdomen is sore and feels bloated but otherwise his abdominal pain is a lot better. No nausea but not really hungry. Having BMs.   Objective:   Vital signs in last 24 hours: Temp:  [98.4 F (36.9 C)-99.2 F (37.3 C)] 98.4 F (36.9 C) (02/10 0000) Pulse Rate:  [56-169] 98 (02/10 0600) Resp:  [20-29] 22 (02/10 0600) BP: (108-149)/(62-85) 114/77 (02/10 0600) SpO2:  [92 %-98 %] 94 % (02/10 0600) Weight:  [124.3 kg] 124.3 kg (02/10 0500) Last BM Date: 11/01/21 General:   Alert, sitting up the chair. No labored breathing. He is on 3L oxygen [place]. Family at bedside.  Eyes:  Sclera clear, no icterus.  Chest: CTA bilaterally with diminished breath sounds in bases.    Heart:  Regular rate and rhythm; no murmurs, clicks, rubs,  or gallops. Abdomen:  Soft, obese. Normal bowel sounds. NT.   Extremities:  Without clubbing, deformity. Trace edema. Neurologic:  Alert and  oriented x4;  grossly normal neurologically. Skin:  Intact without significant lesions or rashes. Psych:  Alert and cooperative. Normal mood and affect.  Intake/Output from previous day: 02/09 0701 - 02/10 0700 In: 2041.3 [I.V.:1634.1; IV Piggyback:407.3] Out: 1051 [Urine:1050; Stool:1] Intake/Output this shift: No intake/output data recorded.  Lab Results: CBC Recent Labs    10/31/21 0400 11/01/21 0351 11/02/21 0309  WBC 8.6 9.3 13.5*  HGB 13.5 12.8* 12.5*  HCT 40.7 38.3* 37.6*  MCV 97.4 96.5 96.4  PLT 220 222 212   BMET Recent Labs    10/31/21 0400 11/01/21 0351 11/02/21 0309  NA 133* 133* 134*  K 3.8 3.5 3.2*  CL 108 106 105  CO2 18* 20* 20*  GLUCOSE 135* 143* 117*  BUN 37* 20 15  CREATININE 1.09 0.87 0.85  CALCIUM 7.4* 7.5* 7.6*   LFTs Recent Labs    10/31/21 0400 11/01/21 0351 11/02/21 0309  BILITOT 1.0 1.2 1.4*  ALKPHOS 47 52 54  AST 19  21 23   ALT 24 22 22   PROT 5.3* 5.0* 4.9*  ALBUMIN 2.4* 2.2* 2.1*   Recent Labs    10/31/21 0400  LIPASE 47   PT/INR No results for input(s): LABPROT, INR in the last 72 hours.      Imaging Studies: CT CHEST WO CONTRAST  Result Date: 10/30/2021 CLINICAL DATA:  Pneumonia, complication suspected. Respiratory distress, hypoxia, tachypnea. EXAM: CT CHEST WITHOUT CONTRAST TECHNIQUE: Multidetector CT imaging of the chest was performed following the standard protocol without IV contrast. RADIATION DOSE REDUCTION: This exam was performed according to the departmental dose-optimization program which includes automated exposure control, adjustment of the mA and/or kV according to patient size and/or use of iterative reconstruction technique. COMPARISON:  10/28/2021 FINDINGS: Cardiovascular: Mild aortic and coronary artery calcifications. No aortic aneurysm. Normal heart size. No pericardial effusions. Mediastinum/Nodes: Thyroid gland is unremarkable. Scattered mediastinal lymph nodes are not pathologically enlarged. Esophagus is decompressed. Lungs/Pleura: New development of bilateral basilar atelectasis or consolidation. No pleural effusions. No pneumothorax. Airways are patent. Upper Abdomen: Inflammatory changes again demonstrated in the visualized pancreas although incompletely included. There is hazy stranding around the pancreatic head and body with soft tissue gas. The gas collections are new since prior study and may represent necrotizing infection or fistula. CT abdomen and pelvis suggested for further evaluation. Musculoskeletal: Degenerative changes in the spine. IMPRESSION: 1. New development of bilateral  basilar consolidation or atelectasis in the lungs. 2. Incomplete visualization of changes of pancreatitis in the upper abdomen. New finding of peripancreatic gas suggesting necrotizing infection or fistula. CT abdomen and pelvis suggested for further evaluation. 3. Aortic atherosclerosis.  Electronically Signed   By: Lucienne Capers M.D.   On: 10/30/2021 20:00   US RENAL  Result Date: 10/29/2021 CLINICAL DATA:  Acute kidney injury EXAM: RENAL / URINARY TRACT ULTRASOUND COMPLETE COMPARISON:  CT October 28, 2021 FINDINGS: Right Kidney: Renal measurements: 11.8 x 6.1 x 5.5 cm = volume: 207 mL. Echogenicity within normal limits. No mass or hydronephrosis visualized. Left Kidney: Renal measurements: 12.7 x 6.6 x 6.3 cm = volume: 275 mL. Echogenicity within normal limits. No mass or hydronephrosis visualized. Bladder: Not visualized. Other: None. IMPRESSION: No hydronephrosis. Electronically Signed   By: Dahlia Bailiff M.D.   On: 10/29/2021 11:46   CT ABDOMEN LIMITED WO CONTRAST  Result Date: 11/01/2021 CLINICAL DATA:  Necrotizing pancreatitis, foci of gas in pancreatic bed and in anterior pararenal spaces bilaterally greater on RIGHT, question due to necrotizing pancreatitis versus bowel/duodenal perforation EXAM: CT ABDOMEN WITHOUT CONTRAST LIMITED TECHNIQUE: Multidetector CT imaging of the abdomen was performed following the standard protocol without IV contrast. Patient drank water-soluble contrast for this exam. RADIATION DOSE REDUCTION: This exam was performed according to the departmental dose-optimization program which includes automated exposure control, adjustment of the mA and/or kV according to patient size and/or use of iterative reconstruction technique. COMPARISON:  Earlier study 11/01/2021 FINDINGS: Lower chest: Bibasilar pleural effusions and atelectasis Hepatobiliary: Gallbladder and liver normal appearance Pancreas: Significantly enlarged and edematous pancreas with diffuse infiltration of peripancreatic fat planes consistent with acute pancreatitis. Few foci of gas again seen at the pancreatic head/body region. No abnormal fluid collections. No mass or definite hemorrhage. Spleen: Normal appearance Adrenals/Urinary Tract: Adrenal glands, kidneys, and proximal ureters unremarkable.  Stomach/Bowel: Contrast opacifies the stomach, duodenal bulb, proximal descending duodenum, and third portion of duodenum as well as multiple jejunal loops. Bowel wall thickening of second and third portions of duodenum. No contrast extravasation is identified into the peripancreatic tissue planes or the anterior pararenal space to suggest duodenal perforation/ulcer. Diverticulosis of descending and distal transverse colon noted. Vascular/Lymphatic: Atherosclerotic calcifications aorta. No adenopathy. Other: No free intraperitoneal air.  No hernia. Musculoskeletal: Osseous structures unremarkable. IMPRESSION: No extravasation of GI contrast into pancreatic bed or anterior pararenal spaces to suggest duodenal perforation. Persistent visualization of edema and foci of gas at the pancreatic head/body and in the anterior pararenal spaces bilaterally greater on RIGHT, likely reflecting necrotizing pancreatitis. Severe pancreatitis changes again seen. Electronically Signed   By: Lavonia Dana M.D.   On: 11/01/2021 16:38   DG CHEST PORT 1 VIEW  Result Date: 10/30/2021 CLINICAL DATA:  Hypoxia EXAM: PORTABLE CHEST 1 VIEW COMPARISON:  10/29/2021 FINDINGS: Bibasilar opacities, likely atelectasis. Suspect small effusions. Heart is normal size. No acute bony abnormality. IMPRESSION: Small bilateral effusions with bibasilar atelectasis. Electronically Signed   By: Rolm Baptise M.D.   On: 10/30/2021 01:04   DG Chest Port 1 View  Result Date: 10/29/2021 CLINICAL DATA:  Abdominal pain and shortness of breath, pancreatitis EXAM: PORTABLE CHEST 1 VIEW COMPARISON:  Portable exam 0914 hours compared to 10/27/2021 FINDINGS: Normal heart size, mediastinal contours, and pulmonary vascularity. Bibasilar atelectasis. Remaining lungs clear. No definite pleural effusion or pneumothorax. IMPRESSION: Bibasilar atelectasis. Electronically Signed   By: Lavonia Dana M.D.   On: 10/29/2021 09:23   DG Chest [facility] 1 View  Result  Date:  10/27/2021 CLINICAL DATA:  Epigastric pain. EXAM: PORTABLE CHEST 1 VIEW COMPARISON:  08/17/2009 FINDINGS: Shallow inspiration with atelectasis in the lung bases. Heart size and pulmonary vascularity are normal. No airspace disease or consolidation. Mediastinal contours appear intact. IMPRESSION: Shallow inspiration with atelectasis in the bases. Electronically Signed   By: Lucienne Capers M.D.   On: 10/27/2021 23:34   DG Abd Portable 1V  Result Date: 10/28/2021 CLINICAL DATA:  Abdominal pain. EXAM: PORTABLE ABDOMEN - 1 VIEW COMPARISON:  CTA chest abdomen pelvis 7 hours ago. FINDINGS: No gaseous bowel dilatation to suggest obstruction. Contrast excretion noted both kidneys with accumulation of contrast in the bladder compatible with CT performed earlier today. Visualized bony anatomy shows no acute finding. IMPRESSION: Negative. Electronically Signed   By: Misty Stanley M.D.   On: 10/28/2021 08:57   ECHOCARDIOGRAM COMPLETE  Result Date: 10/31/2021    ECHOCARDIOGRAM REPORT   Patient Name:   DURAND WITTMEYER Date of Exam: 10/31/2021 Medical Rec #:  [id number]      Height:       70.0 in Accession #:    [id number]     Weight:       273.1 lb Date of Birth:  [birth year]     BSA:          2.383 m Patient Age:    62 years       BP:           118/62 mmHg Patient Gender: M              HR:           113 bpm. Exam Location:  [facility] Procedure: 2D Echo, Cardiac Doppler and Color Doppler Indications:    Dyspnea  History:        Patient has prior history of Echocardiogram examinations, most                 recent 06/09/2014. Arrythmias:Atrial Fibrillation and Atrial                 Flutter; Risk Factors:Hypertension, Dyslipidemia and Former                 Smoker.  Sonographer:    Wenda Low Referring Phys: [id number] Charlesetta Ivory GONFA  Sonographer Comments: Patient is morbidly obese. Image acquisition challenging due to respiratory motion. Patient is on Bi-Pap IMPRESSIONS  1. Left ventricular ejection fraction, by estimation, is  60 to 65%. The left ventricle has normal function. The left ventricle has no regional wall motion abnormalities. There is moderate left ventricular hypertrophy. Left ventricular diastolic parameters are indeterminate.  2. Right ventricular systolic function is normal. The right ventricular size is normal. There is normal pulmonary artery systolic pressure.  3. Left atrial size was mildly dilated.  4. Prominent epicardial adipose tissue.  5. The mitral valve is normal in structure. No evidence of mitral valve regurgitation. No evidence of mitral stenosis.  6. The aortic valve is tricuspid. There is mild calcification of the aortic valve. Aortic valve regurgitation is not visualized. Aortic valve sclerosis is present, with no evidence of aortic valve stenosis.  7. The inferior vena cava is normal in size with greater than 50% respiratory variability, suggesting right atrial pressure of 3 mmHg. FINDINGS  Left Ventricle: Left ventricular ejection fraction, by estimation, is 60 to 65%. The left ventricle has normal function. The left ventricle has no regional wall motion abnormalities. The left ventricular internal cavity size was normal in size.  There is  moderate left ventricular hypertrophy. Left ventricular diastolic parameters are indeterminate. Right Ventricle: The right ventricular size is normal. No increase in right ventricular wall thickness. Right ventricular systolic function is normal. There is normal pulmonary artery systolic pressure. The tricuspid regurgitant velocity is 2.36 m/s, and  with an assumed right atrial pressure of 3 mmHg, the estimated right ventricular systolic pressure is 38.7 mmHg. Left Atrium: Left atrial size was mildly dilated. Right Atrium: Right atrial size was normal in size. Pericardium: Prominent epicardial adipose tissue. There is no evidence of pericardial effusion. Mitral Valve: The mitral valve is normal in structure. No evidence of mitral valve regurgitation. No evidence of  mitral valve stenosis. MV peak gradient, 5.5 mmHg. The mean mitral valve gradient is 2.0 mmHg. Tricuspid Valve: The tricuspid valve is normal in structure. Tricuspid valve regurgitation is not demonstrated. No evidence of tricuspid stenosis. Aortic Valve: The aortic valve is tricuspid. There is mild calcification of the aortic valve. Aortic valve regurgitation is not visualized. Aortic valve sclerosis is present, with no evidence of aortic valve stenosis. Aortic valve mean gradient measures 4.5 mmHg. Aortic valve peak gradient measures 10.3 mmHg. Aortic valve area, by VTI measures 2.73 cm. Pulmonic Valve: The pulmonic valve was normal in structure. Pulmonic valve regurgitation is not visualized. No evidence of pulmonic stenosis. Aorta: The aortic root is normal in size and structure. Venous: The inferior vena cava is normal in size with greater than 50% respiratory variability, suggesting right atrial pressure of 3 mmHg. IAS/Shunts: No atrial level shunt detected by color flow Doppler.  LEFT VENTRICLE PLAX 2D LVIDd:         3.50 cm   Diastology LVIDs:         2.10 cm   LV e' medial:    15.40 cm/s LV PW:         1.20 cm   LV E/e' medial:  7.5 LV IVS:        1.50 cm   LV e' lateral:   13.30 cm/s LVOT diam:     2.10 cm   LV E/e' lateral: 8.7 LV SV:         71 LV SV Index:   30 LVOT Area:     3.46 cm  RIGHT VENTRICLE RV Basal diam:  3.55 cm RV Mid diam:    2.70 cm RV S prime:     16.43 cm/s LEFT ATRIUM             Index        RIGHT ATRIUM           Index LA diam:        4.30 cm 1.80 cm/m   RA Area:     22.00 cm LA Vol (A2C):   54.0 ml 22.66 ml/m  RA Volume:   69.50 ml  29.17 ml/m LA Vol (A4C):   69.4 ml 29.12 ml/m LA Biplane Vol: 67.5 ml 28.33 ml/m  AORTIC VALVE                     PULMONIC VALVE AV Area (Vmax):    2.72 cm      PV Vmax:       1.08 m/s AV Area (Vmean):   2.67 cm      PV Peak grad:  4.7 mmHg AV Area (VTI):     2.73 cm AV Vmax:           160.50 cm/s AV Vmean:  100.300 cm/s AV VTI:             0.260 m AV Peak Grad:      10.3 mmHg AV Mean Grad:      4.5 mmHg LVOT Vmax:         126.00 cm/s LVOT Vmean:        77.200 cm/s LVOT VTI:          0.205 m LVOT/AV VTI ratio: 0.79  AORTA Ao Root diam: 3.20 cm MITRAL VALVE                TRICUSPID VALVE MV Area (PHT): 3.17 cm     TR Peak grad:   22.3 mmHg MV Area VTI:   3.48 cm     TR Vmax:        236.00 cm/s MV Peak grad:  5.5 mmHg MV Mean grad:  2.0 mmHg     SHUNTS MV Vmax:       1.17 m/s     Systemic VTI:  0.20 m MV Vmean:      65.2 cm/s    Systemic Diam: 2.10 cm MV Decel Time: 239 msec MV E velocity: 116.00 cm/s Jenkins Rouge MD Electronically signed by Jenkins Rouge MD Signature Date/Time: 10/31/2021/10:20:51 AM    Final    CT PANCREAS ABD W/WO  Result Date: 11/01/2021 CLINICAL DATA:  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 EXAM: CT ABDOMEN WITHOUT AND WITH CONTRAST TECHNIQUE: Multidetector CT imaging of the abdomen was performed following the standard protocol before and following the bolus administration of intravenous contrast. RADIATION DOSE REDUCTION: This exam was performed according to the departmental dose-optimization program which includes automated exposure control, adjustment of the mA and/or kV according to patient size and/or use of iterative reconstruction technique. CONTRAST:  181mL OMNIPAQUE IOHEXOL 300 MG/ML  SOLN COMPARISON:  Recent imaging from October 28, 2021. FINDINGS: Lower chest: Small bilateral effusions and basilar volume loss. Hepatobiliary: Hepatic steatosis with lobular hepatic contours. No focal, suspicious hepatic lesion. Hepatic arterial supply derive from the celiac axis and SMA with replaced RIGHT hepatic arterial supply arising from the SMA. Gallbladder with mild surrounding stranding felt to be secondary from pancreatic and retroperitoneal process. The portal vein or remains patent as does the splenic vein. Pancreas: Marked interstitial edematous pancreatitis. Note that the pancreatic parenchyma is poorly enhancing but does  enhance, scattered areas of non enhancement are noted. There is extensive surrounding inflammation. No large hematoma. There is however gas that has developed in the LEFT and RIGHT retroperitoneum greatest on the RIGHT. Gas along the celiac and about the neck of the pancreas largely sparing the pancreas proper is new compared to February 5th and incompletely imaged on the recent comparison evaluation. Spleen: Normal. Adrenals/Urinary Tract: Adrenal glands are normal. Symmetric renal enhancement without hydronephrosis. Stomach/Bowel: Duodenal inflammation persists with surrounding stranding further extending into retroperitoneal fascial planes and associated with gas as described. No signs of pneumatosis. There also very small locules of gas anterior to the duodenum (image 92/11). No pneumoperitoneum. Vascular/Lymphatic: Atherosclerotic changes of the abdominal aorta. No adenopathy in the retroperitoneum. Other: Retroperitoneal gas as described above. Greatest collection of gas along the RIGHT flank posterior to the colon measuring 5.0 x 2.9 cm. Extensive inflammation with worsening extending throughout retroperitoneal fascial planes. Extensive body wall edema. Musculoskeletal: No acute bone finding. No destructive bone process. Spinal degenerative changes. IMPRESSION: 1. 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 2. Dissemination of inflammation throughout the retroperitoneum with  gas greater on the RIGHT than the LEFT. Findings are suspicious for infection of peripancreatic necrosis, given rapid development aggressive or necrotizing infection is considered and should be correlated with the patient's clinical condition. 3. Given small locules of gas adjacent to the duodenum and the presence of gas in the retroperitoneum would consider follow-up imaging with enteric contrast media utilizing CT to exclude the possibility of a GI source of gas from  bowel compromise particularly of the duodenum. 4. Hepatic steatosis with lobular hepatic contours. 5. Small bilateral effusions and basilar volume loss. 6. Aortic atherosclerosis. Aortic Atherosclerosis (ICD10-I70.0). These results will be called to the ordering clinician or representative by the Radiologist Assistant, and communication documented in the PACS or [employer]. Electronically Signed   By: Zetta Bills M.D.   On: 11/01/2021 12:11   CT Angio Chest/Abd/Pel for Dissection W and/or Wo Contrast  Result Date: 10/28/2021 CLINICAL DATA:  Dyspnea, epigastric abdominal pain EXAM: CT ANGIOGRAPHY CHEST, ABDOMEN AND PELVIS TECHNIQUE: Non-contrast CT of the chest was initially obtained. Multidetector CT imaging through the chest, abdomen and pelvis was performed using the standard protocol during bolus administration of intravenous contrast. Multiplanar reconstructed images and MIPs were obtained and reviewed to evaluate the vascular anatomy. RADIATION DOSE REDUCTION: This exam was performed according to the departmental dose-optimization program which includes automated exposure control, adjustment of the mA and/or kV according to patient size and/or use of iterative reconstruction technique. CONTRAST:  182mL OMNIPAQUE IOHEXOL 350 MG/ML SOLN COMPARISON:  None. FINDINGS: CTA CHEST FINDINGS Cardiovascular: The thoracic aorta is normal in course and caliber; no intramural hematoma, dissection, or aneurysm. Minimal atherosclerotic calcification. No significant coronary artery calcification. Global cardiac size within normal limits. No pericardial effusion. Central pulmonary arteries are of normal caliber. Mediastinum/Nodes: Visualized thyroid is unremarkable. No pathologic thoracic adenopathy. The esophagus is fluid-filled, likely reflecting the sequela of gastroesophageal reflux given gastric distension. Lungs/Pleura: Mild bilateral dependent atelectasis. The lungs are otherwise clear. No pneumothorax or  pleural effusion. Musculoskeletal: No acute bone abnormality Review of the MIP images confirms the above findings. CTA ABDOMEN AND PELVIS FINDINGS VASCULAR Aorta: Normal caliber. No aneurysm or dissection. Mild atherosclerotic calcification. No periaortic inflammatory change. Celiac: Unremarkable SMA: Replaced right hepatic artery.  Otherwise unremarkable. Renals: Dual renal arteries are noted bilaterally. Atherosclerotic calcification results in less than 50% stenoses of the dominant renal arteries at their origins bilaterally. Normal vascular morphology. No aneurysm or dissection. IMA: Widely patent. Inflow: Widely patent. No aneurysm or dissection. Internal iliac arteries are patent bilaterally. Veins: No obvious venous abnormality within the limitations of this arterial phase study. Review of the MIP images confirms the above findings. NON-VASCULAR Hepatobiliary: No focal liver abnormality is seen. No gallstones, gallbladder wall thickening, or biliary dilatation. Pancreas: There is extensive peripancreatic inflammatory stranding with inflammatory fluid tracking into the mesenteric root and into the right anterior pararenal space. Normal enhancement of the pancreatic parenchyma. No loculated intrapancreatic or peripancreatic fluid collections. The pancreatic duct is not dilated. Altogether, the findings are in keeping with changes of acute interstitial/edematous pancreatitis. Spleen: Unremarkable. Adrenals/Urinary Tract: Adrenal glands are unremarkable. Kidneys are normal, without renal calculi, focal lesion, or hydronephrosis. Bladder is unremarkable. Stomach/Bowel: The stomach is distended and fluid filled suggesting changes of gastric outlet obstruction. There is hyperemia of the duodenum as well as extensive periduodenal inflammatory stranding likely related to the adjacent inflammatory process involving the pancreas. The small bowel is unremarkable. There is extensive distal transverse, descending, and  sigmoid colonic diverticulosis as well as ascending  colonic diverticulosis. No superimposed acute inflammatory change. Appendix normal. No free intraperitoneal gas or fluid. Lymphatic: No pathologic adenopathy. Reproductive: Prostate is unremarkable. Other: Small bilateral fat containing inguinal hernias and umbilical hernia are identified. Musculoskeletal: Degenerative changes are seen within the lumbar spine. No lytic or blastic bone lesion. No acute bone abnormality. Review of the MIP images confirms the above findings. IMPRESSION: No evidence of aortic dissection or aneurysm. Mild atherosclerotic calcification. No evidence of hemodynamically significant stenosis involving the visceral arterial vasculature or lower extremity arterial inflow. Findings in keeping with acute interstitial/edematous pancreatitis. No evidence of pancreatic or peripancreatic necrosis at this time. Duodenal hyperemia likely related to the adjacent inflammatory process. Fluid distension of the stomach likely related to gastric outlet obstruction secondary to the adjacent inflammatory process. Fluid within the distal esophagus in keeping with gastroesophageal reflux. Aortic Atherosclerosis (ICD10-I70.0). Electronically Signed   By: Fidela Salisbury M.D.   On: 10/28/2021 01:31   US Abdomen Limited RUQ (LIVER/GB)  Result Date: 10/28/2021 CLINICAL DATA:  Acute pancreatitis. EXAM: ULTRASOUND ABDOMEN LIMITED RIGHT UPPER QUADRANT COMPARISON:  CT 10/28/2021 FINDINGS: Gallbladder: No gallstones or wall thickening visualized. No sonographic Murphy sign noted by sonographer. Common bile duct: Diameter: 5.5 mm.  No intrahepatic bile duct dilatation. Liver: The liver has a heterogeneous echotexture and slightly irregular contour. No focal liver abnormality identified. Trace perihepatic ascites portal vein is patent on color Doppler imaging with normal direction of blood flow towards the liver. Other: None. IMPRESSION: 1. No gallstones or signs of  acute cholecystitis. 2. Echogenic liver compatible with hepatic steatosis 3. Trace perihepatic free fluid Electronically Signed   By: Kerby Moors M.D.   On: 10/28/2021 09:41  [2 weeks]  Assessment:   76 year old male admitted with pancreatitis felt most likely to be secondary to alcohol, unable to rule out microlithiasis in the setting of new onset elevated transaminases but with AST greater than ALT, suspected to be secondary to alcohol use.  Ultrasound abdomen without gallstones.  On admission, CTA chest/abdomen/pelvis with acute interstitial/edematous pancreatitis, fluid distention of the stomach and distal esophagus likely related to gastric outlet obstruction secondary to adjacent inflammatory process.   Acute pancreatitis complicated by necrosis: Clinically patient has improved.  He had dedicated pancreatic protocol CT yesterday with worsening edema suspected areas of pancreatic necrosis, and peripancreatic necrosis, presence of dissemination of gas to the retroperitoneum. Question regarding gas coming from duodenum.  Suspected to have infected necrotizing pancreatitis but oral contrast CT abdomen completed to rule out perforated viscus.  Previously noted distention of stomach and esophagus not seen on current CT.  White blood cell count bumped up somewhat to 13,500, hemoglobin stable at 12.5.  Tmax 99.2.  Continues with episodes of tachycardia.  Renal function normal.  He was switched from Unasyn to [place] yesterday.   Respiratory distress: Overall much improved.  Maintaining oxygen saturations on 3 L.  Plan:   Trial of full liquids. Continue Merrem. Continue IV pantoprazole for now. Continue supportive measures.   LOS: 5 days   Laureen Ochs. Bernarda Caffey [facility] [phone] 2/10/20239:15 AM

## 2021-11-02 NOTE — Plan of Care (Signed)
°  Problem: Acute Rehab PT Goals(only PT should resolve) Goal: Pt Will Go Supine/Side To Sit Outcome: Progressing Flowsheets (Taken 11/02/2021 1512) Pt will go Supine/Side to Sit: with supervision Goal: Patient Will Transfer Sit To/From Stand Outcome: Progressing Flowsheets (Taken 11/02/2021 1512) Patient will transfer sit to/from stand: with supervision Goal: Pt Will Transfer Bed To Chair/Chair To Bed Outcome: Progressing Flowsheets (Taken 11/02/2021 1512) Pt will Transfer Bed to Chair/Chair to Bed: with supervision Goal: Pt Will Ambulate Outcome: Progressing Flowsheets (Taken 11/02/2021 1512) Pt will Ambulate:  100 feet  with rolling walker  with supervision    3:14 PM, 11/02/21 [facility] [place] [phone] Ph:[phone]

## 2021-11-02 NOTE — Evaluation (Signed)
Physical Therapy Evaluation Patient Details Name: Sean Golden MRN: [id number] DOB: 1945-12-02 Today's Date: 11/02/2021  History of Present Illness  Sean Golden is a 76 y.o. male with medical history significant of with history of afib, essential hypertension, GERD, H. pylori, sleep apnea, and more presents the ED with a chief complaint of stomach pain.  Patient reports he had dinner and at 6:30 PM he had acute onset of severe, cramping epigastric pain.  After some time the pain started radiating to the right and down slightly to the level of the umbilicus.  Patient reports he has had nausea and dry heaves but no vomiting.  He has not had any loose stools.  His last normal bowel movement was the previous day.  Patient has never had pancreatitis before.  Patient has never had gallbladder concerns before.  He does not have any fevers.  He did have some hypoxia in the ER from breathing shallow because it hurts to take a deep breath, but has been maintaining his oxygen sats with 2 L nasal cannula.  He denies any melena, hematochezia, dysuria, hematuria.  Patient denies any trauma to his abdomen, change in medication, trips to the [facility], familial lipid disorders, but he does drink.  He drinks about 2 glasses of wine per evening.  Patient has no other complaints at this time.   Clinical Impression  Patient presents with general weakness and decreased functional mobility; SOB with min exertion.  He needs RW for short ambulation x 20 ft on room air and mod SOB after ambulation with SpO2 dropping from 93% to 87%. After a short seated rest his SpO2 returned to 95%. Patient seated in chair at the end of treatment with spouse present.  PT notified nurse of above. Patient will benefit from continued skilled PT services to address weakness, SOB and decreased functional mobility.        Recommendations for follow up therapy are one component of a multi-disciplinary discharge planning process, led by the  attending physician.  Recommendations may be updated based on patient status, additional functional criteria and insurance authorization.  Follow Up Recommendations Home health PT    Assistance Recommended at Discharge PRN  Patient can return home with the following  A little help with walking and/or transfers;A little help with bathing/dressing/bathroom;Help with stairs or ramp for entrance    Equipment Recommendations    Recommendations for Other Services       Functional Status Assessment Patient has had a recent decline in their functional status and demonstrates the ability to make significant improvements in function in a reasonable and predictable amount of time.     Precautions / Restrictions Precautions Precautions: Fall Restrictions Weight Bearing Restrictions: No      Mobility  Bed Mobility Overal bed mobility: Modified Independent                  Transfers Overall transfer level: Needs assistance Equipment used: Rolling walker (2 wheels) Transfers: Sit to/from Stand, Bed to chair/wheelchair/BSC Sit to Stand: Min guard   Step pivot transfers: Min guard       General transfer comment: patient with SOB with min exertion    Ambulation/Gait Ambulation/Gait assistance: Min guard Gait Distance (Feet): 20 Feet Assistive device: Rolling walker (2 wheels) Gait Pattern/deviations: Shuffle       General Gait Details: decreased gait speed, mod SOB after walking on room air with SpO2 dropping from 93% to 87%  Stairs  Wheelchair Mobility    Modified Rankin (Stroke Patients Only)       Balance Overall balance assessment: Needs assistance Sitting-balance support: Single extremity supported Sitting balance-Leahy Scale: Good Sitting balance - Comments: able to put on R sock with SBA, unable to don L sock   Standing balance support: Bilateral upper extremity supported, Reliant on assistive device for balance Standing balance-Leahy  Scale: Good Standing balance comment: used RW for standing balance and energy conservation; good balance with RW                             Pertinent Vitals/Pain Pain Assessment Pain Score: 0-No pain    Home Living Family/patient expects to be discharged to:: Private residence Living Arrangements: Spouse/significant other Available Help at Discharge: Family Type of Home: House Home Access: Stairs to enter Entrance Stairs-Rails: Left;Right;Can reach both Entrance Stairs-Number of Steps: 1-2 Alternate Level Stairs-Number of Steps: 12 to basement Home Layout: Two level;Laundry or work area in [place]: [name] (2 wheels);Cane - single point      Prior Function Prior Level of Function : Independent/Modified Independent             Mobility Comments: I with ADL's; driving       Hand Dominance        Extremity/Trunk Assessment   Upper Extremity Assessment Upper Extremity Assessment: Defer to OT evaluation    Lower Extremity Assessment Lower Extremity Assessment: Generalized weakness       Communication   Communication: No difficulties  Cognition Arousal/Alertness: Awake/alert   Overall Cognitive Status: Within Functional Limits for tasks assessed                                          General Comments      Exercises     Assessment/Plan    PT Assessment Patient needs continued PT services  PT Problem List Decreased strength;Decreased activity tolerance;Decreased safety awareness;Decreased balance;Decreased mobility       PT Treatment Interventions Balance training;Gait training;Neuromuscular re-education;Patient/family education;Stair training;Therapeutic activities;Therapeutic exercise;Functional mobility training    PT Goals (Current goals can be found in the Care Plan section)  Acute Rehab PT Goals Patient Stated Goal: to return home PT Goal Formulation: With  patient/family Time For Goal Achievement: 11/16/21 Potential to Achieve Goals: Good    Frequency Min 3X/week     Co-evaluation               AM-PAC PT "6 Clicks" Mobility  Outcome Measure Help needed turning from your back to your side while in a flat bed without using bedrails?: A Little Help needed moving from lying on your back to sitting on the side of a flat bed without using bedrails?: A Little Help needed moving to and from a bed to a chair (including a wheelchair)?: A Little Help needed standing up from a chair using your arms (e.g., wheelchair or bedside chair)?: A Little Help needed to walk in hospital room?: A Little Help needed climbing 3-5 steps with a railing? : A Little 6 Click Score: 18    End of Session   Activity Tolerance: Other (comment) (Patient limited by SOB) Patient left: in chair;with family/visitor present;with call bell/phone within reach Nurse Communication: Mobility status PT Visit Diagnosis: Unsteadiness on feet (R26.81);Other abnormalities of gait and mobility (R26.89);Muscle weakness (generalized) (  M62.81)    Time: 4315-4008 PT Time Calculation (min) (ACUTE ONLY): 38 min   Charges:   PT Evaluation $PT Eval High Complexity: 1 High PT Treatments $Therapeutic Activity: 38-52 mins        2:57 PM, 11/02/21  [facility] [place] [phone]

## 2021-11-02 NOTE — Progress Notes (Signed)
ANTICOAGULATION CONSULT NOTE   Pharmacy Consult for Heparin Indication: atrial fibrillation  No Known Allergies  Patient Measurements: Height: 5\' 10"  (177.8 cm) Weight: 124.3 kg (274 lb 0.5 oz) IBW/kg (Calculated) : 73 HEPARIN DW (KG): 100.9   Vital Signs: Temp: 98.4 F (36.9 C) (02/10 0000) Temp Source: Oral (02/10 0000) BP: 114/77 (02/10 0600) Pulse Rate: 98 (02/10 0600)  Labs: Recent Labs    10/31/21 0400 10/31/21 1616 11/01/21 0351 11/01/21 1240 11/01/21 1249 11/01/21 2143 11/02/21 0309  HGB 13.5  --  12.8*  --   --   --  12.5*  HCT 40.7  --  38.3*  --   --   --  37.6*  PLT 220  --  222  --   --   --  212  APTT 61*   < >  --   --  56* 63* 74*  HEPARINUNFRC 0.82*  --  0.48 0.41  --   --  0.35  CREATININE 1.09  --  0.87  --   --   --  0.85   < > = values in this interval not displayed.     Estimated Creatinine Clearance: 102.4 mL/min (by C-G formula based on SCr of 0.85 mg/dL).   Assessment: Patient admitted with acute pancreatitis. He is chronically anticoagulated with eliquis for afib. Last dose was 2/4 at 0800. Holding eliquis in case he needs procedure and transitioning to heparin. Eliquis will be affecting heparin levels so will utilize aPTT for monitoring until levels correlate.  aPTT 74 seconds- therapeutic HL- 0.35- therapeutic  Will monitor based on heparin levels now that correlating   Goal of Therapy:  Heparin level 0.3-0.7 units/ml aPTT 66-102 seconds Monitor platelets by anticoagulation protocol: Yes   Plan:  Continue heparin infusion at 2600 units/hr Check heparin level daily Monitor H&H and s/s of bleeding.   Margot Ables, PharmD Clinical Pharmacist 11/02/2021 9:00 AM

## 2021-11-02 NOTE — Progress Notes (Signed)
Patient did well with PT & was placed on RA by PT, O2 SATs sustaining in 90s on RA at this time, denies any SOB, no noted respiratory distress, OOB & up in the chair for most of the morning, had large BM this morning w/o difficulty, has been able to tolerate clear liquids well & take PO meds w/o difficulty

## 2021-11-03 DIAGNOSIS — K8522 Alcohol induced acute pancreatitis with infected necrosis: Secondary | ICD-10-CM

## 2021-11-03 LAB — CBC
HCT: 40.7 % (ref 39.0–52.0)
Hemoglobin: 13.5 g/dL (ref 13.0–17.0)
MCH: 31 pg (ref 26.0–34.0)
MCHC: 33.2 g/dL (ref 30.0–36.0)
MCV: 93.6 fL (ref 80.0–100.0)
Platelets: 234 10*3/uL (ref 150–400)
RBC: 4.35 MIL/uL (ref 4.22–5.81)
RDW: 13.1 % (ref 11.5–15.5)
WBC: 23.5 10*3/uL — ABNORMAL HIGH (ref 4.0–10.5)
nRBC: 0.2 % (ref 0.0–0.2)

## 2021-11-03 LAB — COMPREHENSIVE METABOLIC PANEL
ALT: 22 U/L (ref 0–44)
AST: 23 U/L (ref 15–41)
Albumin: 2.1 g/dL — ABNORMAL LOW (ref 3.5–5.0)
Alkaline Phosphatase: 66 U/L (ref 38–126)
Anion gap: 12 (ref 5–15)
BUN: 12 mg/dL (ref 8–23)
CO2: 20 mmol/L — ABNORMAL LOW (ref 22–32)
Calcium: 7.9 mg/dL — ABNORMAL LOW (ref 8.9–10.3)
Chloride: 101 mmol/L (ref 98–111)
Creatinine, Ser: 0.75 mg/dL (ref 0.61–1.24)
GFR, Estimated: >60 mL/min (ref >60)
Glucose, Bld: 150 mg/dL — ABNORMAL HIGH (ref 70–99)
Potassium: 3.3 mmol/L — ABNORMAL LOW (ref 3.5–5.1)
Sodium: 133 mmol/L — ABNORMAL LOW (ref 135–145)
Total Bilirubin: 1.3 mg/dL — ABNORMAL HIGH (ref 0.3–1.2)
Total Protein: 5.4 g/dL — ABNORMAL LOW (ref 6.5–8.1)

## 2021-11-03 LAB — HEPARIN LEVEL (UNFRACTIONATED): Heparin Unfractionated: 0.34 IU/mL (ref 0.30–0.70)

## 2021-11-03 MED ORDER — POTASSIUM CHLORIDE 10 MEQ/100ML IV SOLN
10.0000 meq | INTRAVENOUS | Status: AC
  Administered 2021-11-03 (×3): 10 meq via INTRAVENOUS
  Filled 2021-11-03 (×3): qty 100

## 2021-11-03 NOTE — Progress Notes (Signed)
PROGRESS NOTE Sean Golden  CHE:[id number] DOB: 04-11-1946 DOA: 10/27/2021 PCP: Celene Squibb, MD   Brief Narrative/Hospital Course: Sean Golden, 76 y.o. male 76 year old M with PMH of A-fib on Eliquis, HTN, GERD/erosive esophagitis, H. Pylori and OSA presenting with acute onset cramping epigastric abdominal pain radiating to his right abdomen after he had a dinner, and admitted for acute pancreatitis.  Lipase elevated to 1680.  Mild LFT elevation with pattern consistent with EtOH or rhabdo but CK within normal.  Patient admits to drinking 2 to 3 glasses of wine before bedtime.  CTA C/A/P concerning for acute pancreatitis without necrosis or pseudocyst.  CT also showed fluid distention of the stomach and esophagus concerning for GOO or an GERD.  Started on IV fluid, IV analgesics and antiemetics, and admitted.  GI consulted.    RUQ [place] negative for gallstone or signs of acute cholecystitis but hepatic steatosis.  Pancreatitis improving clinically and chemically.  However, patient developed respiratory distress with hypoxia requiring supplemental oxygen likely from atelectasis and IV fluid.  IV fluid discontinued.  Started on IV Lasix. CT chest 2/7-New development of bilateral basilar consolidation or atelectasis in the lungs, New finding of peripancreatic gas suggesting necrotizing infection or fistula.   Subjective: Seen and examined wife at the bedside. Overnight received Haldol this morning short of breath-needing 4 L nasal cannula saturating 94%.  Able to use CPAP during the night. Tmax 99.1, labs with significant leukocytosis this morning.  Total bili at 1.3 albumin low 2.1 bicarbonate 20 and potassium 3.3 with mild hyponatremia BM charted x2 yesterday, urine output 1350 mL slightly better from yesterday Patient denies any abdominal pain nausea vomiting.  Assessment and Plan: * Acute necrotizing pancreatitis- (present on admission) CT chest 2/7-showed new finding of peripancreatic edema  suggestive of necrotizing infection or fistula-started on Unasyn 2/7 CT pancreas done 2/9-showing signs of severe pancreatitis also some locules of gas adjacent to the duodenum-as well as other findings see the report, antibiotic changed to meropenem, underwent CT of the abd w/ po contrast-no obvious perforation or source of gas from at this time -GI following closely,  Hold off IV fluid as he is swollen now,tolerating diet having bowel movement LFTs stable.   -Overnight with significant leukocytosis, mild fever, patient on meropenem Discussed with GI and also with Dr. Haskell Riling IR there is no fluid to aspirate for culture.Discussed with ID, will continue current antibiotics. -Continue current plan of care as per GI, diet-low-fat, pain management.    Acute respiratory failure with hypoxia (HCC) Multifactorial in the setting of severe pancreatitis, morbid obesity with sleep apnea.chest CT 2/7 bilateral basilar consolidation or atelectasis, got Lasix 60 mg iv 2 2/7. BNP slightly elevated.Echo shows EF 60 to 65%, no RWMA, moderate LVH indeterminate diastolic parameters, RV systolic function normal -Low suspicion of PE as he is on IV heparin already for A-fib  -Was needing BiPAP continuous transition to 10 L HFNC and has been off to nasal cannula improving slowly but short of breath this morning.   -Encourage incentive spirometry, OOB  Net IO Since Admission: 3,583.54 mL [11/03/21 0805]  Filed Weights   11/01/21 0545 11/02/21 0500 11/03/21 0500  Weight: 125.6 kg 124.3 kg 536.1 kg     Metabolic acidosis Bicarb is stable at 20.monitor  AKI (acute kidney injury) ([place]) Resolved.Suspect multifactorial in the setting of pancreatitis dehydration.Continue Flomax  Intake/Output Summary (Last 24 hours) at 11/03/2021 0808 Last data filed at 11/03/2021 0500 Gross per 24 hour  Intake 2919.87 ml  Output 1350 ml  Net 1569.87 ml   Recent Labs  Lab 10/30/21 0602 10/31/21 0400 11/01/21 0351 11/02/21 0309  11/03/21 0554  BUN 42* 37* 20 15 12   CREATININE 1.33* 1.09 0.87 0.85 0.75    Lactic acidosis In the setting of pancreatitis and dehydration. Resolved.   Recent Labs  Lab 10/30/21 0602 10/30/21 0806 10/31/21 0400  LATICACIDVEN 2.3* 2.9* 1.6     Hypokalemia- (present on admission) We will replete iv. Recent Labs  Lab 10/30/21 0602 10/31/21 0400 11/01/21 0351 11/02/21 0309 11/03/21 0554  K 4.0 3.8 3.5 3.2* 3.3*    Paroxysmal atrial fibrillation (HCC)- (present on admission) Now rate controlled after resuming oral Cardizem 240 mg am,-If remains poorly controlled can also add his home 120 mg in the evening Cont prn IV Lopressor.  Continue heparin anticoagulation.   Obesity with sleep apnea Will benefit with PCP follow-up, weight loss.  On CPAP at home.  Currently needing BiPAP here bedtime.  Bandemia- (present on admission) Bandemia likely in the setting of pancreatitis.  Initial leukocytosis had resolved but overnight significant bump.Remains on meropenem.Monitor closely. Recent Labs  Lab 10/30/21 0602 10/31/21 0400 11/01/21 0351 11/02/21 0309 11/03/21 0554  WBC 8.5 8.6 9.3 13.5* 23.5*    Polycythemia Likely from hemconcentration and sleep apnea related.  Resolved    Transaminitis with hyperbilirubinemia- (present on admission) Pattern consistent with alcohol or rhabdo but CK within normal.  Improved.  Mixed hyperlipidemia- (present on admission) Holding statin  Gastroesophageal reflux disease- (present on admission) Fluid within distal esophagus suggesting GERD as he reports heartburn.Cont PPI bid.  Sleep apnea usese CPAP at home. Here cont BiPAP  Essential hypertension- (present on admission) BP is controlled, continue to hold losartan continue Cardizem.    DVT prophylaxis: SCDs Start: 10/28/21 0322 heaprin gtt Code Status:   Code Status: Full Code Family Communication: plan of care discussed with patient/ his wife and family at the bedside.    Disposition: Currently not medically stable for discharge. Status is: Inpatient, stepdown unit. Remains inpatient appropriate because: Ongoing management of respiratory failure Pancreatitis.Continue to monitor in the stepdown unit high risk of decompensation.   Objective: Vitals last 24 hrs: Vitals:   11/03/21 0500 11/03/21 0700 11/03/21 0754 11/03/21 0757  BP: 125/68     Pulse:  93 90 84  Resp:  (!) 31 (!) 22 16  Temp: 99 F (37.2 C)  99.1 F (37.3 C)   TempSrc: Oral  Oral   SpO2:  92% 100% 92%  Weight: 128.7 kg     Height:       Weight change: 4.4 kg  Physical Examination: General exam: Aa0x3, pleasant, obese, not in distress. HEENT:Oral mucosa moist, Ear/Nose WNL grossly, dentition normal. Respiratory system: bilaterally diminished, no use of accessory muscle Cardiovascular system: S1 & S2 +, No JVD,. Gastrointestinal system: Abdomen soft, obese, mildly tender on deep palpation,BS+ Nervous System:Alert, awake, moving extremities and grossly nonfocal Extremities: LE ankle and UE edema +, distal peripheral pulses palpable.  Skin: No rashes,no icterus. MSK: Normal muscle bulk,tone, power    Medications reviewed:  Scheduled Meds:  baclofen  5 mg Oral TID   brimonidine  1 drop Right Eye BID   Chlorhexidine Gluconate Cloth  6 each Topical Daily   diltiazem  240 mg Oral Daily   pantoprazole (PROTONIX) IV  40 mg Intravenous Q12H   polyethylene glycol  17 g Oral BID   tamsulosin  0.8 mg Oral QPM   Continuous Infusions:  heparin 2,600 Units/hr (11/03/21  1275)   meropenem (MERREM) IV 1 g (11/03/21 0820)   potassium chloride 10 mEq (11/03/21 1050)     Diet Order             Diet Heart Room service appropriate? Yes; Fluid consistency: Thin  Diet effective now                    Intake/Output Summary (Last 24 hours) at 11/03/2021 1140 Last data filed at 11/03/2021 1700 Gross per 24 hour  Intake 3159.87 ml  Output 1350 ml  Net 1809.87 ml   Net IO Since  Admission: 3,823.54 mL [11/03/21 1140]  Wt Readings from Last 3 Encounters:  11/03/21 128.7 kg  05/15/21 113.4 kg  04/20/21 115.8 kg     Unresulted Labs (From admission, onward)     Start     Ordered   11/01/21 0500  Heparin level (unfractionated)  Daily,   R      10/30/21 1903          Data Reviewed: I have personally reviewed following labs and imaging studies CBC: Recent Labs  Lab 10/28/21 0419 10/29/21 0527 10/30/21 0602 10/31/21 0400 11/01/21 0351 11/02/21 0309 11/03/21 0554  WBC 12.1*   < > 8.5 8.6 9.3 13.5* 23.5*  NEUTROABS 11.0*  --   --   --   --   --   --   HGB 17.6*   < > 14.2 13.5 12.8* 12.5* 13.5  HCT 54.0*   < > 44.7 40.7 38.3* 37.6* 40.7  MCV 95.7   < > 98.2 97.4 96.5 96.4 93.6  PLT 339   < > 246 220 222 212 234   < > = values in this interval not displayed.   Basic Metabolic Panel: Recent Labs  Lab 10/28/21 0419 10/29/21 0527 10/30/21 0111 10/30/21 0602 10/31/21 0400 11/01/21 0351 11/02/21 0309 11/03/21 0554  NA 138 139 137 135 133* 133* 134* 133*  K 3.7 4.8 3.9 4.0 3.8 3.5 3.2* 3.3*  CL 108 111 110 110 108 106 105 101  CO2 20* 20* 17* 17* 18* 20* 20* 20*  GLUCOSE 176* 161* 137* 141* 135* 143* 117* 150*  BUN 21 38* 43* 42* 37* 20 15 12   CREATININE 0.95 1.77* 1.45* 1.33* 1.09 0.87 0.85 0.75  CALCIUM 8.7* 7.4* 6.9* 6.9* 7.4* 7.5* 7.6* 7.9*  MG 1.7 2.3 2.1 2.2  --   --   --   --   PHOS  --  3.6  --  2.8  --   --   --   --    GFR: Estimated Creatinine Clearance: 110.9 mL/min (by C-G formula based on SCr of 0.75 mg/dL). Liver Function Tests: Recent Labs  Lab 10/30/21 0602 10/31/21 0400 11/01/21 0351 11/02/21 0309 11/03/21 0554  AST 24 19 21 23 23   ALT 30 24 22 22 22   ALKPHOS 49 47 52 54 66  BILITOT 0.6 1.0 1.2 1.4* 1.3*  PROT 5.4* 5.3* 5.0* 4.9* 5.4*  ALBUMIN 2.6* 2.4* 2.2* 2.1* 2.1*   Recent Labs  Lab 10/27/21 2312 10/28/21 0419 10/29/21 0527 10/30/21 0602 10/31/21 0400  LIPASE 1,681* 1,678* 551* 144* 47   Recent Labs   Lab 10/29/21 0527 10/30/21 0602  AMMONIA 25 32   Coagulation Profile: No results for input(s): INR, PROTIME in the last 168 hours. Cardiac Enzymes: Recent Labs  Lab 10/28/21 0419 10/30/21 0602  CKTOTAL 71 78   BNP (last 3 results) No results for input(s): PROBNP in the last  8760 hours. HbA1C: No results for input(s): HGBA1C in the last 72 hours. CBG: No results for input(s): GLUCAP in the last 168 hours. Lipid Profile: No results for input(s): CHOL, HDL, LDLCALC, TRIG, CHOLHDL, LDLDIRECT in the last 72 hours. Thyroid Function Tests: No results for input(s): TSH, T4TOTAL, FREET4, T3FREE, THYROIDAB in the last 72 hours. Anemia Panel: No results for input(s): VITAMINB12, FOLATE, FERRITIN, TIBC, IRON, RETICCTPCT in the last 72 hours. Sepsis Labs: Recent Labs  Lab 10/29/21 1029 10/30/21 0602 10/30/21 0806 10/31/21 0400  LATICACIDVEN 3.2* 2.3* 2.9* 1.6    Recent Results (from the past 240 hour(s))  Resp Panel by RT-PCR (Flu A&B, Covid) Nasopharyngeal Swab     Status: None   Collection Time: 10/28/21  3:00 AM   Specimen: Nasopharyngeal Swab; Nasopharyngeal(NP) swabs in vial transport medium  Result Value Ref Range Status   SARS Coronavirus 2 by RT PCR NEGATIVE NEGATIVE Final    Comment: (NOTE) SARS-CoV-2 target nucleic acids are NOT DETECTED.  The SARS-CoV-2 RNA is generally detectable in upper respiratory specimens during the acute phase of infection. The lowest concentration of SARS-CoV-2 viral copies this assay can detect is 138 copies/mL. A negative result does not preclude SARS-Cov-2 infection and should not be used as the sole basis for treatment or other patient management decisions. A negative result may occur with  improper specimen collection/handling, submission of specimen other than nasopharyngeal swab, presence of viral mutation(s) within the areas targeted by this assay, and inadequate number of viral copies(<138 copies/mL). A negative result must be  combined with clinical observations, patient history, and epidemiological information. The expected result is Negative.  Fact Sheet for Patients:  EntrepreneurPulse.com.au  Fact Sheet for Healthcare Providers:  IncredibleEmployment.be  This test is no t yet approved or cleared by the [place] FDA and  has been authorized for detection and/or diagnosis of SARS-CoV-2 by FDA under an Emergency Use Authorization (EUA). This EUA will remain  in effect (meaning this test can be used) for the duration of the COVID-19 declaration under Section 564(b)(1) of the Act, 21 U.S.C.section 360bbb-3(b)(1), unless the authorization is terminated  or revoked sooner.       Influenza A by PCR NEGATIVE NEGATIVE Final   Influenza B by PCR NEGATIVE NEGATIVE Final    Comment: (NOTE) The Xpert Xpress SARS-CoV-2/FLU/RSV plus assay is intended as an aid in the diagnosis of influenza from Nasopharyngeal swab specimens and should not be used as a sole basis for treatment. Nasal washings and aspirates are unacceptable for Xpert Xpress SARS-CoV-2/FLU/RSV testing.  Fact Sheet for Patients: EntrepreneurPulse.com.au  Fact Sheet for Healthcare Providers: IncredibleEmployment.be  This test is not yet approved or cleared by the [place] FDA and has been authorized for detection and/or diagnosis of SARS-CoV-2 by FDA under an Emergency Use Authorization (EUA). This EUA will remain in effect (meaning this test can be used) for the duration of the COVID-19 declaration under Section 564(b)(1) of the Act, 21 U.S.C. section 360bbb-3(b)(1), unless the authorization is terminated or revoked.  Performed at [facility], [address] [place],  [address]   MRSA Next Gen by PCR, Nasal     Status: None   Collection Time: 10/30/21  5:46 PM   Specimen: Nasal Mucosa; Nasal Swab  Result Value Ref Range Status   MRSA by PCR Next Gen NOT  DETECTED NOT DETECTED Final    Comment: (NOTE) The GeneXpert MRSA Assay (FDA approved for NASAL specimens only), is one component of a comprehensive MRSA colonization surveillance  program. It is not intended to diagnose MRSA infection nor to guide or monitor treatment for MRSA infections. Test performance is not FDA approved in patients less than 70 years old. Performed at [facility], [address], [place], [place] [address]     Antimicrobials: Anti-infectives (From admission, onward)    Start     Dose/Rate Route Frequency Ordered Stop   11/01/21 1600  meropenem (MERREM) 1 g in sodium chloride 0.9 % 100 mL IVPB        1 g 200 mL/hr over 30 Minutes Intravenous Every 8 hours 11/01/21 1454     10/30/21 2100  Ampicillin-Sulbactam (UNASYN) 3 g in sodium chloride 0.9 % 100 mL IVPB  Status:  Discontinued        3 g 200 mL/hr over 30 Minutes Intravenous Every 6 hours 10/30/21 2024 11/01/21 1453      Culture/Microbiology No results found for: SDES, [place], CULT, REPTSTATUS   Radiology Studies: CT ABDOMEN LIMITED WO CONTRAST  Result Date: 11/01/2021 CLINICAL DATA:  Necrotizing pancreatitis, foci of gas in pancreatic bed and in anterior pararenal spaces bilaterally greater on RIGHT, question due to necrotizing pancreatitis versus bowel/duodenal perforation EXAM: CT ABDOMEN WITHOUT CONTRAST LIMITED TECHNIQUE: Multidetector CT imaging of the abdomen was performed following the standard protocol without IV contrast. Patient drank water-soluble contrast for this exam. RADIATION DOSE REDUCTION: This exam was performed according to the departmental dose-optimization program which includes automated exposure control, adjustment of the mA and/or kV according to patient size and/or use of iterative reconstruction technique. COMPARISON:  Earlier study 11/01/2021 FINDINGS: Lower chest: Bibasilar pleural effusions and atelectasis Hepatobiliary: Gallbladder and liver normal appearance Pancreas:  Significantly enlarged and edematous pancreas with diffuse infiltration of peripancreatic fat planes consistent with acute pancreatitis. Few foci of gas again seen at the pancreatic head/body region. No abnormal fluid collections. No mass or definite hemorrhage. Spleen: Normal appearance Adrenals/Urinary Tract: Adrenal glands, kidneys, and proximal ureters unremarkable. Stomach/Bowel: Contrast opacifies the stomach, duodenal bulb, proximal descending duodenum, and third portion of duodenum as well as multiple jejunal loops. Bowel wall thickening of second and third portions of duodenum. No contrast extravasation is identified into the peripancreatic tissue planes or the anterior pararenal space to suggest duodenal perforation/ulcer. Diverticulosis of descending and distal transverse colon noted. Vascular/Lymphatic: Atherosclerotic calcifications aorta. No adenopathy. Other: No free intraperitoneal air.  No hernia. Musculoskeletal: Osseous structures unremarkable. IMPRESSION: No extravasation of GI contrast into pancreatic bed or anterior pararenal spaces to suggest duodenal perforation. Persistent visualization of edema and foci of gas at the pancreatic head/body and in the anterior pararenal spaces bilaterally greater on RIGHT, likely reflecting necrotizing pancreatitis. Severe pancreatitis changes again seen. Electronically Signed   By: Lavonia Dana M.D.   On: 11/01/2021 16:38   CT PANCREAS ABD W/WO  Result Date: 11/01/2021 CLINICAL DATA:  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 EXAM: CT ABDOMEN WITHOUT AND WITH CONTRAST TECHNIQUE: Multidetector CT imaging of the abdomen was performed following the standard protocol before and following the bolus administration of intravenous contrast. RADIATION DOSE REDUCTION: This exam was performed according to the departmental dose-optimization program which includes automated exposure control, adjustment of the mA and/or kV according to patient size and/or use of iterative  reconstruction technique. CONTRAST:  171mL OMNIPAQUE IOHEXOL 300 MG/ML  SOLN COMPARISON:  Recent imaging from October 28, 2021. FINDINGS: Lower chest: Small bilateral effusions and basilar volume loss. Hepatobiliary: Hepatic steatosis with lobular hepatic contours. No focal, suspicious hepatic lesion. Hepatic arterial supply derive from the celiac axis and SMA  with replaced RIGHT hepatic arterial supply arising from the SMA. Gallbladder with mild surrounding stranding felt to be secondary from pancreatic and retroperitoneal process. The portal vein or remains patent as does the splenic vein. Pancreas: Marked interstitial edematous pancreatitis. Note that the pancreatic parenchyma is poorly enhancing but does enhance, scattered areas of non enhancement are noted. There is extensive surrounding inflammation. No large hematoma. There is however gas that has developed in the LEFT and RIGHT retroperitoneum greatest on the RIGHT. Gas along the celiac and about the neck of the pancreas largely sparing the pancreas proper is new compared to February 5th and incompletely imaged on the recent comparison evaluation. Spleen: Normal. Adrenals/Urinary Tract: Adrenal glands are normal. Symmetric renal enhancement without hydronephrosis. Stomach/Bowel: Duodenal inflammation persists with surrounding stranding further extending into retroperitoneal fascial planes and associated with gas as described. No signs of pneumatosis. There also very small locules of gas anterior to the duodenum (image 92/11). No pneumoperitoneum. Vascular/Lymphatic: Atherosclerotic changes of the abdominal aorta. No adenopathy in the retroperitoneum. Other: Retroperitoneal gas as described above. Greatest collection of gas along the RIGHT flank posterior to the colon measuring 5.0 x 2.9 cm. Extensive inflammation with worsening extending throughout retroperitoneal fascial planes. Extensive body wall edema. Musculoskeletal: No acute bone finding. No  destructive bone process. Spinal degenerative changes. IMPRESSION: 1. 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 2. Dissemination of inflammation throughout the retroperitoneum with gas greater on the RIGHT than the LEFT. Findings are suspicious for infection of peripancreatic necrosis, given rapid development aggressive or necrotizing infection is considered and should be correlated with the patient's clinical condition. 3. Given small locules of gas adjacent to the duodenum and the presence of gas in the retroperitoneum would consider follow-up imaging with enteric contrast media utilizing CT to exclude the possibility of a GI source of gas from bowel compromise particularly of the duodenum. 4. Hepatic steatosis with lobular hepatic contours. 5. Small bilateral effusions and basilar volume loss. 6. Aortic atherosclerosis. Aortic Atherosclerosis (ICD10-I70.0). These results will be called to the ordering clinician or representative by the Radiologist Assistant, and communication documented in the PACS or [employer]. Electronically Signed   By: Zetta Bills M.D.   On: 11/01/2021 12:11     LOS: 6 days   Antonieta Pert, MD [facility]  11/03/2021, 11:40 AM

## 2021-11-03 NOTE — Progress Notes (Signed)
Maylon Peppers, M.D. Gastroenterology & Hepatology   Interval History:  No acute events overnight. Patient has remained afebrile and hemodynamically stable.  He reports feeling well overall he was put back on oxygen supplementation via nasal cannula, currently on 8 L of oxygen. States that his abdominal pain has much more improved and he has very mild pain, has been tolerating diet adequately.  Had 1 loose bowel movement today. Notably, his labs show today a spike in his white blood cell count up to 23,500.  Had mild hypokalemia of 3.3 and serum of 133 with rest of CMP only showing mild elevation of his total bilirubin up to 1.3. Daughter reports that he has gained some fluid in his lower extremities.  Inpatient Medications:  Current Facility-Administered Medications:    acetaminophen (TYLENOL) tablet 650 mg, 650 mg, Oral, Q6H PRN **OR** acetaminophen (TYLENOL) suppository 650 mg, 650 mg, Rectal, Q6H PRN, Cyndia Skeeters, Taye T, MD   baclofen (LIORESAL) tablet 5 mg, 5 mg, Oral, TID, Mahala Menghini, PA-C, 5 mg at 11/03/21 0813   brimonidine (ALPHAGAN) 0.2 % ophthalmic solution 1 drop, 1 drop, Right Eye, BID, Wendee Beavers T, MD, 1 drop at 11/03/21 1059   Chlorhexidine Gluconate Cloth 2 % PADS 6 each, 6 each, Topical, Daily, Wendee Beavers T, MD, 6 each at 11/03/21 1059   diltiazem (CARDIZEM CD) 24 hr capsule 240 mg, 240 mg, Oral, Daily, Kc, Ramesh, MD, 240 mg at 11/03/21 1058   heparin ADULT infusion 100 units/mL (25000 units/25m), 2,600 Units/hr, Intravenous, Continuous, Kc, Ramesh, MD, Last Rate: 26 mL/hr at 11/03/21 0506, 2,600 Units/hr at 11/03/21 0506   HYDROmorphone (DILAUDID) injection 0.5 mg, 0.5 mg, Intravenous, Q2H PRN, GCyndia Skeeters Taye T, MD, 0.5 mg at 11/03/21 0757   levalbuterol (XOPENEX) nebulizer solution 0.63 mg, 0.63 mg, Nebulization, Q6H PRN, GCyndia Skeeters Taye T, MD, 0.63 mg at 11/03/21 0836   meropenem (MERREM) 1 g in sodium chloride 0.9 % 100 mL IVPB, 1 g, Intravenous, Q8H, Kc, Ramesh, MD, Last  Rate: 200 mL/hr at 11/03/21 0820, 1 g at 11/03/21 0820   metoprolol tartrate (LOPRESSOR) injection 5 mg, 5 mg, Intravenous, PRN, Kc, Ramesh, MD   ondansetron (ZOFRAN) tablet 4 mg, 4 mg, Oral, Q6H PRN **OR** ondansetron (ZOFRAN) injection 4 mg, 4 mg, Intravenous, Q6H PRN, Gonfa, Taye T, MD   pantoprazole (PROTONIX) injection 40 mg, 40 mg, Intravenous, Q12H, Gonfa, Taye T, MD, 40 mg at 11/03/21 1058   polyethylene glycol (MIRALAX / GLYCOLAX) packet 17 g, 17 g, Oral, BID, GCyndia Skeeters Taye T, MD, 17 g at 11/01/21 0914   sodium chloride (OCEAN) 0.65 % nasal spray 1 spray, 1 spray, Each Nare, PRN, KAntonieta Pert MD, 1 spray at 11/02/21 1828   tamsulosin (FLOMAX) capsule 0.8 mg, 0.8 mg, Oral, QPM, GCyndia Skeeters Taye T, MD, 0.8 mg at 11/02/21 1703   I/O    Intake/Output Summary (Last 24 hours) at 11/03/2021 1311 Last data filed at 11/03/2021 1151 Gross per 24 hour  Intake 3279.87 ml  Output 1550 ml  Net 1729.87 ml     Physical Exam: Temp:  [98.2 F (36.8 C)-99.1 F (37.3 C)] 99.1 F (37.3 C) (02/11 1144) Pulse Rate:  [84-116] 88 (02/11 1130) Resp:  [16-31] 18 (02/11 1130) BP: (125-152)/(68-85) 134/85 (02/11 1000) SpO2:  [92 %-100 %] 94 % (02/11 1130) Weight:  [128.7 kg] 128.7 kg (02/11 0500)  Temp (24hrs), Avg:98.9 F (37.2 C), Min:98.2 F (36.8 C), Max:99.1 F (37.3 C) GENERAL: The patient is AO x3, in no acute distress. Obese.  On [place]. HEENT: Head is normocephalic and atraumatic. EOMI are intact. Mouth is well hydrated and without lesions. NECK: Supple. No masses LUNGS: Decreased breath sounds in both bases. HEART: RRR, normal s1 and s2. ABDOMEN: Soft, nontender, no guarding, no peritoneal signs, and nondistended. BS +. No masses. EXTREMITIES: Without any cyanosis, clubbing, rash, lesions.  Has presence of +2 lower extremity edema bilaterally. NEUROLOGIC: AOx3, no focal motor deficit. SKIN: no jaundice, no rashes  Laboratory Data: CBC:     Component Value Date/Time   WBC 23.5 (H) 11/03/2021 0554    RBC 4.35 11/03/2021 0554   HGB 13.5 11/03/2021 0554   HCT 40.7 11/03/2021 0554   PLT 234 11/03/2021 0554   MCV 93.6 11/03/2021 0554   MCH 31.0 11/03/2021 0554   MCHC 33.2 11/03/2021 0554   RDW 13.1 11/03/2021 0554   LYMPHSABS 0.3 (L) 10/28/2021 0419   MONOABS 0.7 10/28/2021 0419   EOSABS 0.0 10/28/2021 0419   BASOSABS 0.0 10/28/2021 0419   COAG:  Lab Results  Component Value Date   INR 1.2 05/22/2021   INR 1.1 12/10/2019   INR 2.2 08/28/2009    BMP:  BMP Latest Ref Rng & Units 11/03/2021 11/02/2021 11/01/2021  Glucose 70 - 99 mg/dL 150(H) 117(H) 143(H)  BUN 8 - 23 mg/dL _0 Creatinine 0.61 - 1.24 mg/dL 0.75 0.85 0.87  Sodium 135 - 145 mmol/L 133(L) 134(L) 133(L)  Potassium 3.5 - 5.1 mmol/L 3.3(L) 3.2(L) 3.5  Chloride 98 - 111 mmol/L 101 105 106  CO2 22 - 32 mmol/L 20(L) 20(L) 20(L)  Calcium 8.9 - 10.3 mg/dL 7.9(L) 7.6(L) 7.5(L)    HEPATIC:  Hepatic Function Latest Ref Rng & Units 11/03/2021 11/02/2021 11/01/2021  Total Protein 6.5 - 8.1 g/dL 5.4(L) 4.9(L) 5.0(L)  Albumin 3.5 - 5.0 g/dL 2.1(L) 2.1(L) 2.2(L)  AST 15 - 41 U/L _1 ALT 0 - 44 U/L _2 Alk Phosphatase 38 - 126 U/L 66 54 52  Total Bilirubin 0.3 - 1.2 mg/dL 1.3(H) 1.4(H) 1.2    CARDIAC:  Lab Results  Component Value Date   CKTOTAL 78 10/30/2021      Imaging: I personally reviewed and interpreted the available labs, imaging and endoscopic files.   Assessment/Plan: 76 year old male with past medical history of atrial fibrillation, GERD, who was admitted to the hospital after presenting worsening abdominal pain due to alcoholic pancreatitis.  During his hospital course he has presented changes on imaging concerning for severe necrotizing pancreatitis with suspicion for superimposed infection as he developed presence of gas extending to the retroperitoneum.  Currently he is on broad spectrum antibiotic with meropenem and improving clinically with very mild pain and tolerating diet adequately.   Presence of a perforated viscus was ruled out via CT of the abdomen with oral contrast.  Notably, he had presence of worsening respiratory distress due to small pleural effusions and decreased expansion of his chest wall.  No cardiac etiologies have been found for this.  Currently improving from this standpoint but still requiring some supplemental oxygen.  Patient has clinically improved from his acute necrotizing pancreatitis standpoint.  I discussed with infectious disease (Dr. West Bali) and interventional radiology (Dr. Dwaine Gale) his case.  There is no fluid that could be drained from the areas of necrosis so a CT guided biopsy is not possible at this moment.  As he has clinically improved with the current antibiotic coverage, his leukocytosis could be reactive to the necrosis and severity of pancreatitis.  We will monitor clinically his course and advance his diet as tolerated, with a goal to finish his antibiotic course for a total of 7 days.  We will also stop his fluids as he is tolerating diet and he is actually retaining fluid peripherally.  -Stop IV fluids -Continue meropenem, needs to complete a 7-day antibiotic course -Continue supportive measurements with pain control and oxygen support via nasal cannula -Advance diet as tolerated -Alcohol cessation  Maylon Peppers, MD Gastroenterology and Hepatology [facility]

## 2021-11-03 NOTE — Progress Notes (Signed)
Patient ambulated around ICU unit x 1 lap (~116ft), tolerated well with some SOB, ambulated on O2 [place] @ 6L

## 2021-11-03 NOTE — Progress Notes (Signed)
Noted SOB this AM upon shift change, wore CPAP throughout the night & was given Haldol @ 0113 this AM, O2 SAT 89% on RA, placed on O2 [place] @ 4L & O2 SAT 94% at this time, transferred patient from bed up in chair at this time

## 2021-11-03 NOTE — Progress Notes (Signed)
ANTICOAGULATION CONSULT NOTE   Pharmacy Consult for Heparin Indication: atrial fibrillation  No Known Allergies  Patient Measurements: Height: 5\' 10"  (177.8 cm) Weight: 128.7 kg (283 lb 11.7 oz) IBW/kg (Calculated) : 73 HEPARIN DW (KG): 100.9   Vital Signs: Temp: 99.1 F (37.3 C) (02/11 0754) Temp Source: Oral (02/11 0754) BP: 125/68 (02/11 0500) Pulse Rate: 84 (02/11 0757)  Labs: Recent Labs    11/01/21 0351 11/01/21 1240 11/01/21 1249 11/01/21 2143 11/02/21 0309 11/03/21 0554  HGB 12.8*  --   --   --  12.5* 13.5  HCT 38.3*  --   --   --  37.6* 40.7  PLT 222  --   --   --  212 234  APTT  --   --  56* 63* 74*  --   HEPARINUNFRC 0.48 0.41  --   --  0.35 0.34  CREATININE 0.87  --   --   --  0.85 0.75     Estimated Creatinine Clearance: 110.9 mL/min (by C-G formula based on SCr of 0.75 mg/dL).   Assessment: Patient admitted with acute pancreatitis. He is chronically anticoagulated with eliquis for afib. Last dose was 2/4 at 0800. Holding eliquis in case he needs procedure and transitioning to heparin. Eliquis will be affecting heparin levels so will utilize aPTT for monitoring until levels correlate.  HL- 0.34- therapeutic  monitor based on heparin levels now that aptt correlating   Goal of Therapy:  Heparin level 0.3-0.7 units/ml aPTT 66-102 seconds Monitor platelets by anticoagulation protocol: Yes   Plan:  Continue heparin infusion at 2600 units/hr Check heparin level daily Monitor H&H and s/s of bleeding.   Thomasenia Sales, PharmD, [facility] Clinical Pharmacist  11/03/2021 8:28 AM

## 2021-11-04 DIAGNOSIS — R0602 Shortness of breath: Secondary | ICD-10-CM

## 2021-11-04 LAB — COMPREHENSIVE METABOLIC PANEL
ALT: 20 U/L (ref 0–44)
AST: 24 U/L (ref 15–41)
Albumin: 1.9 g/dL — ABNORMAL LOW (ref 3.5–5.0)
Alkaline Phosphatase: 63 U/L (ref 38–126)
Anion gap: 9 (ref 5–15)
BUN: 10 mg/dL (ref 8–23)
CO2: 21 mmol/L — ABNORMAL LOW (ref 22–32)
Calcium: 7.5 mg/dL — ABNORMAL LOW (ref 8.9–10.3)
Chloride: 103 mmol/L (ref 98–111)
Creatinine, Ser: 0.7 mg/dL (ref 0.61–1.24)
GFR, Estimated: >60 mL/min (ref >60)
Glucose, Bld: 152 mg/dL — ABNORMAL HIGH (ref 70–99)
Potassium: 3.2 mmol/L — ABNORMAL LOW (ref 3.5–5.1)
Sodium: 133 mmol/L — ABNORMAL LOW (ref 135–145)
Total Bilirubin: 0.9 mg/dL (ref 0.3–1.2)
Total Protein: 4.9 g/dL — ABNORMAL LOW (ref 6.5–8.1)

## 2021-11-04 LAB — CBC
HCT: 38.2 % — ABNORMAL LOW (ref 39.0–52.0)
Hemoglobin: 12.8 g/dL — ABNORMAL LOW (ref 13.0–17.0)
MCH: 31.5 pg (ref 26.0–34.0)
MCHC: 33.5 g/dL (ref 30.0–36.0)
MCV: 94.1 fL (ref 80.0–100.0)
Platelets: 271 10*3/uL (ref 150–400)
RBC: 4.06 MIL/uL — ABNORMAL LOW (ref 4.22–5.81)
RDW: 13.2 % (ref 11.5–15.5)
WBC: 26.3 10*3/uL — ABNORMAL HIGH (ref 4.0–10.5)
nRBC: 0.2 % (ref 0.0–0.2)

## 2021-11-04 LAB — MAGNESIUM: Magnesium: 1.7 mg/dL (ref 1.7–2.4)

## 2021-11-04 LAB — HEPARIN LEVEL (UNFRACTIONATED)
Heparin Unfractionated: 0.29 IU/mL — ABNORMAL LOW (ref 0.30–0.70)
Heparin Unfractionated: 0.45 IU/mL (ref 0.30–0.70)

## 2021-11-04 LAB — PROCALCITONIN: Procalcitonin: 1.62 ng/mL

## 2021-11-04 MED ORDER — POTASSIUM CHLORIDE CRYS ER 20 MEQ PO TBCR
40.0000 meq | EXTENDED_RELEASE_TABLET | Freq: Once | ORAL | Status: AC
  Administered 2021-11-04: 40 meq via ORAL
  Filled 2021-11-04: qty 2

## 2021-11-04 MED ORDER — ALUM & MAG HYDROXIDE-SIMETH 200-200-20 MG/5ML PO SUSP
15.0000 mL | Freq: Four times a day (QID) | ORAL | Status: DC | PRN
  Administered 2021-11-04 – 2021-11-05 (×2): 15 mL via ORAL
  Filled 2021-11-04 (×2): qty 30

## 2021-11-04 MED ORDER — METOCLOPRAMIDE HCL 5 MG/ML IJ SOLN
5.0000 mg | Freq: Once | INTRAMUSCULAR | Status: AC
  Administered 2021-11-04: 5 mg via INTRAVENOUS
  Filled 2021-11-04: qty 2

## 2021-11-04 NOTE — Progress Notes (Signed)
ANTICOAGULATION CONSULT NOTE   Pharmacy Consult for Heparin Indication: atrial fibrillation  No Known Allergies  Patient Measurements: Height: 5\' 10"  (177.8 cm) Weight: 128 kg (282 lb 3 oz) IBW/kg (Calculated) : 73 HEPARIN DW (KG): 100.9   Vital Signs: Temp: 98.1 F (36.7 C) (02/12 1124) Temp Source: Oral (02/12 1124) BP: 135/94 (02/12 1200) Pulse Rate: 93 (02/12 1200)  Labs: Recent Labs    11/01/21 2143 11/02/21 0309 11/02/21 0309 11/03/21 0554 11/04/21 0318 11/04/21 1427  HGB  --  12.5*   < > 13.5 12.8*  --   HCT  --  37.6*  --  40.7 38.2*  --   PLT  --  212  --  234 271  --   APTT 63* 74*  --   --   --   --   HEPARINUNFRC  --  0.35   < > 0.34 0.29* 0.45  CREATININE  --  0.85  --  0.75 0.70  --    < > = values in this interval not displayed.     Estimated Creatinine Clearance: 110.5 mL/min (by C-G formula based on SCr of 0.7 mg/dL).   Assessment: Patient admitted with acute pancreatitis. He is chronically anticoagulated with eliquis for afib. Last dose was 2/4 at 0800. Holding eliquis in case he needs procedure and transitioning to heparin. Eliquis will be affecting heparin levels so will utilize aPTT for monitoring until levels correlate.  HL- 0.45- therapeutic  monitor based on heparin levels now that aptt correlating   Goal of Therapy:  Heparin level 0.3-0.7 units/ml aPTT 66-102 seconds Monitor platelets by anticoagulation protocol: Yes   Plan:  Continue heparin infusion at 2700 units/hr Check heparin level daily Monitor H&H and s/s of bleeding.   Thomasenia Sales, PharmD, [facility] Clinical Pharmacist  11/04/2021 3:39 PM

## 2021-11-04 NOTE — Progress Notes (Signed)
ANTICOAGULATION CONSULT NOTE   Pharmacy Consult for Heparin Indication: atrial fibrillation  No Known Allergies  Patient Measurements: Height: 5\' 10"  (177.8 cm) Weight: 128 kg (282 lb 3 oz) IBW/kg (Calculated) : 73 HEPARIN DW (KG): 100.9   Vital Signs: Temp: 98.1 F (36.7 C) (02/12 0728) Temp Source: Oral (02/12 0728) BP: 125/71 (02/12 0600) Pulse Rate: 95 (02/12 0728)  Labs: Recent Labs    11/01/21 1240 11/01/21 1249 11/01/21 2143 11/02/21 0309 11/03/21 0554 11/04/21 0318  HGB   < >  --   --  12.5* 13.5 12.8*  HCT  --   --   --  37.6* 40.7 38.2*  PLT  --   --   --  212 234 271  APTT  --  56* 63* 74*  --   --   HEPARINUNFRC  --   --   --  0.35 0.34 0.29*  CREATININE  --   --   --  0.85 0.75 0.70   < > = values in this interval not displayed.     Estimated Creatinine Clearance: 110.5 mL/min (by C-G formula based on SCr of 0.7 mg/dL).   Assessment: Patient admitted with acute pancreatitis. He is chronically anticoagulated with eliquis for afib. Last dose was 2/4 at 0800. Holding eliquis in case he needs procedure and transitioning to heparin. Eliquis will be affecting heparin levels so will utilize aPTT for monitoring until levels correlate.  HL- 0.29- slightly subtherapeutic  monitor based on heparin levels now that aptt correlating   Goal of Therapy:  Heparin level 0.3-0.7 units/ml aPTT 66-102 seconds Monitor platelets by anticoagulation protocol: Yes   Plan:  Increase heparin infusion to 2700 units/hr Check heparin level daily Monitor H&H and s/s of bleeding.   Thomasenia Sales, PharmD, [facility] Clinical Pharmacist  11/04/2021 7:56 AM

## 2021-11-04 NOTE — Progress Notes (Signed)
Sean Golden, M.D. Gastroenterology & Hepatology   Interval History:  No acute events overnight. Patient states feeling very well and has been off oxygen during the whole morning with saturation at 96% on room air. Stated he does not have any abdominal pain but feels some bloating in his abdomen diffusely.  Has been moving his bowels adequately at least once a day.  No nausea, vomiting, fever or chills.  Has been able to tolerate diet adequately. States that his edema is still present in his 4 extremities.  Try to walk yesterday around the ICU. Labs today showed mild increase in his although cell count up to 26k with rest of cell lines within normal limits.  CMP showed persistent mild hypokalemia 3.2 and hyponatremia 133 with normal total bilirubin of 0.9.  AST of 24 and ALT of 20, albumin 1.9 and alkaline phos the 63.  Inpatient Medications:  Current Facility-Administered Medications:    acetaminophen (TYLENOL) tablet 650 mg, 650 mg, Oral, Q6H PRN **OR** acetaminophen (TYLENOL) suppository 650 mg, 650 mg, Rectal, Q6H PRN, Cyndia Skeeters, Taye T, MD   baclofen (LIORESAL) tablet 5 mg, 5 mg, Oral, TID, Mahala Menghini, PA-C, 5 mg at 11/04/21 0829   brimonidine (ALPHAGAN) 0.2 % ophthalmic solution 1 drop, 1 drop, Right Eye, BID, Wendee Beavers T, MD, 1 drop at 11/04/21 0829   Chlorhexidine Gluconate Cloth 2 % PADS 6 each, 6 each, Topical, Daily, Wendee Beavers T, MD, 6 each at 11/04/21 0830   diltiazem (CARDIZEM CD) 24 hr capsule 240 mg, 240 mg, Oral, Daily, Kc, Ramesh, MD, 240 mg at 11/04/21 0829   heparin ADULT infusion 100 units/mL (25000 units/264m), 2,700 Units/hr, Intravenous, Continuous, Coffee, GDonna Christen [facility] Last Rate: 27 mL/hr at 11/04/21 0830, 2,700 Units/hr at 11/04/21 0830   HYDROmorphone (DILAUDID) injection 0.5 mg, 0.5 mg, Intravenous, Q2H PRN, GCyndia Skeeters Taye T, MD, 0.5 mg at 11/03/21 0757   levalbuterol (XOPENEX) nebulizer solution 0.63 mg, 0.63 mg, Nebulization, Q6H PRN, GCyndia Skeeters Taye T, MD, 0.63  mg at 11/03/21 0836   meropenem (MERREM) 1 g in sodium chloride 0.9 % 100 mL IVPB, 1 g, Intravenous, Q8H, Kc, Ramesh, MD, Last Rate: 200 mL/hr at 11/04/21 0828, 1 g at 11/04/21 0828   metoprolol tartrate (LOPRESSOR) injection 5 mg, 5 mg, Intravenous, PRN, Kc, Ramesh, MD   ondansetron (ZOFRAN) tablet 4 mg, 4 mg, Oral, Q6H PRN **OR** ondansetron (ZOFRAN) injection 4 mg, 4 mg, Intravenous, Q6H PRN, Gonfa, Taye T, MD   pantoprazole (PROTONIX) injection 40 mg, 40 mg, Intravenous, Q12H, Gonfa, Taye T, MD, 40 mg at 11/04/21 0830   polyethylene glycol (MIRALAX / GLYCOLAX) packet 17 g, 17 g, Oral, BID, GCyndia Skeeters Taye T, MD, 17 g at 11/01/21 0914   potassium chloride SA (KLOR-CON M) CR tablet 40 mEq, 40 mEq, Oral, Once, Kc, Ramesh, MD   sodium chloride (OCEAN) 0.65 % nasal spray 1 spray, 1 spray, Each Nare, PRN, Kc, Ramesh, MD, 1 spray at 11/02/21 1828   tamsulosin (FLOMAX) capsule 0.8 mg, 0.8 mg, Oral, QPM, Gonfa, Taye T, MD, 0.8 mg at 11/03/21 1801   I/O    Intake/Output Summary (Last 24 hours) at 11/04/2021 1033 Last data filed at 11/04/2021 0756 Gross per 24 hour  Intake 1815.05 ml  Output 2450 ml  Net -634.95 ml     Physical Exam: Temp:  [97.9 F (36.6 C)-99.1 F (37.3 C)] 98.1 F (36.7 C) (02/12 0728) Pulse Rate:  [38-115] 108 (02/12 0851) Resp:  [13-32] 19 (02/12 0851) BP: (114-151)/(70-89) 143/86 (02/12  0800) SpO2:  [81 %-97 %] 96 % (02/12 0851) Weight:  [086 kg] 128 kg (02/12 0500)  Temp (24hrs), Avg:98.6 F (37 C), Min:97.9 F (36.6 C), Max:99.1 F (37.3 C) GENERAL: The patient is AO x3, in no acute distress. Obese.  HEENT: Head is normocephalic and atraumatic. EOMI are intact. Mouth is well hydrated and without lesions. NECK: Supple. No masses LUNGS: Decreased breath sounds in both bases. HEART: RRR, normal s1 and s2. ABDOMEN: Soft, nontender, no guarding, no peritoneal signs, and mildy distended. BS +. No masses. EXTREMITIES: Without any cyanosis, clubbing, rash, lesions.  Has  presence of +1 in upper and  lower extremity edema bilaterally. NEUROLOGIC: AOx3, no focal motor deficit. SKIN: no jaundice, no rashes  Laboratory Data: CBC:     Component Value Date/Time   WBC 26.3 (H) 11/04/2021 0318   RBC 4.06 (L) 11/04/2021 0318   HGB 12.8 (L) 11/04/2021 0318   HCT 38.2 (L) 11/04/2021 0318   PLT 271 11/04/2021 0318   MCV 94.1 11/04/2021 0318   MCH 31.5 11/04/2021 0318   MCHC 33.5 11/04/2021 0318   RDW 13.2 11/04/2021 0318   LYMPHSABS 0.3 (L) 10/28/2021 0419   MONOABS 0.7 10/28/2021 0419   EOSABS 0.0 10/28/2021 0419   BASOSABS 0.0 10/28/2021 0419   COAG:  Lab Results  Component Value Date   INR 1.2 05/22/2021   INR 1.1 12/10/2019   INR 2.2 08/28/2009    BMP:  BMP Latest Ref Rng & Units 11/04/2021 11/03/2021 11/02/2021  Glucose 70 - 99 mg/dL 152(H) 150(H) 117(H)  BUN 8 - 23 mg/dL _0 Creatinine 0.61 - 1.24 mg/dL 0.70 0.75 0.85  Sodium 135 - 145 mmol/L 133(L) 133(L) 134(L)  Potassium 3.5 - 5.1 mmol/L 3.2(L) 3.3(L) 3.2(L)  Chloride 98 - 111 mmol/L 103 101 105  CO2 22 - 32 mmol/L 21(L) 20(L) 20(L)  Calcium 8.9 - 10.3 mg/dL 7.5(L) 7.9(L) 7.6(L)    HEPATIC:  Hepatic Function Latest Ref Rng & Units 11/04/2021 11/03/2021 11/02/2021  Total Protein 6.5 - 8.1 g/dL 4.9(L) 5.4(L) 4.9(L)  Albumin 3.5 - 5.0 g/dL 1.9(L) 2.1(L) 2.1(L)  AST 15 - 41 U/L _1 ALT 0 - 44 U/L _2 Alk Phosphatase 38 - 126 U/L 63 66 54  Total Bilirubin 0.3 - 1.2 mg/dL 0.9 1.3(H) 1.4(H)    CARDIAC:  Lab Results  Component Value Date   CKTOTAL 78 10/30/2021      Imaging: I personally reviewed and interpreted the available labs, imaging and endoscopic files.   Assessment/Plan: 76 year old male with past medical history of atrial fibrillation, GERD, who was admitted to the hospital after presenting worsening abdominal pain due to alcoholic pancreatitis.  During his hospital course he has presented changes on imaging concerning for severe necrotizing pancreatitis with  suspicion for superimposed infection as he developed presence of gas extending to the retroperitoneum.  Currently he is on broad spectrum antibiotic with meropenem and improving clinically with very mild pain and tolerating diet adequately.  Presence of a perforated viscus was ruled out via CT of the abdomen with oral contrast.   Notably, he had presence of worsening respiratory distress due to small pleural effusions and decreased expansion of his chest wall.  No cardiac etiologies have been found for this.  Currently improving from this standpoint but still requiring some supplemental oxygen.   Patient has clinically improved from his acute necrotizing pancreatitis standpoint.  I discussed with infectious disease (Dr. West Bali) and interventional radiology (  Dr. Dwaine Gale) his case.  There is no fluid that could be drained from the areas of necrosis so a CT guided biopsy is not possible at this moment.  As he has clinically improved with the current antibiotic coverage, his leukocytosis could be reactive to the necrosis and severity of pancreatitis.  Should monitor clinically his course and advance his diet as tolerated, with a goal to finish his antibiotic course for a total of 7 days with carbapenem.   Regarding his edema, he is still presenting significant third spacing, may consider starting a low-dose diuretic for short-term to help redistribute his interstitial space.  I have very thorough discussion with the family and the patient regarding avoiding alcohol intake to avoid worsening of his current disease or recurrent episodes of pancreatic inflammation.  -Continue meropenem, needs to complete a 7-day antibiotic course -Continue supportive measurements with pain control and oxygen support via nasal cannula if needed -Advance diet as tolerated -Alcohol cessation - Patient will follow up in GI clinic with [facility] in 2-3 weeks. - Will recommend repeating a CBC 1 week after discharge - GI service will  sign-off, please call us back if you have any more questions.   Sean Peppers, MD Gastroenterology and Hepatology [facility]

## 2021-11-04 NOTE — Progress Notes (Signed)
PROGRESS NOTE ATWELL MCDANEL  AXK:[id number] DOB: 28-May-1946 DOA: 10/27/2021 PCP: Celene Squibb, MD   Brief Narrative/Hospital Course: Sean Golden, 76 y.o. male 76 year old M with PMH of A-fib on Eliquis, HTN, GERD/erosive esophagitis, H. Pylori and OSA presenting with acute onset cramping epigastric abdominal pain radiating to his right abdomen after he had a dinner, and admitted for acute pancreatitis.  Lipase elevated to 1680.  Mild LFT elevation with pattern consistent with EtOH or rhabdo but CK within normal.  Patient admits to drinking 2 to 3 glasses of wine before bedtime.  CTA C/A/P concerning for acute pancreatitis without necrosis or pseudocyst.  CT also showed fluid distention of the stomach and esophagus concerning for GOO or an GERD.  Started on IV fluid, IV analgesics and antiemetics, and admitted.  GI consulted.    RUQ [place] negative for gallstone or signs of acute cholecystitis but hepatic steatosis.  Pancreatitis improving clinically and chemically.  However, patient developed respiratory distress with hypoxia requiring supplemental oxygen likely from atelectasis and IV fluid.  IV fluid discontinued.  Started on IV Lasix. CT chest 2/7-New development of bilateral basilar consolidation or atelectasis in the lungs, New finding of peripancreatic gas suggesting necrotizing infection or fistula. Patient further completed CT abdomen. 2/11-although clinically stable had bump in WBC count discussed with ID, IR and GI-for possible aspiration but no fluid to aspirate as per IR, continued on IV meropenem   Subjective: Seen and examined this morning wife at the bedside.  He is on the bedside chair Overnight afebrile, heart rate is stable, intermittent tachypnea On 3 L nasal cannula, had CPAP overnight WBC count continues to trend up-however he denies any new abdominal pain discomfort nausea vomiting.  He does have constant hiccups for several days No cough or fever or chills. LFTs renal function  remains stable. Had BM x1, stool output increasing and improved 2450  Assessment and Plan: * Acute necrotizing pancreatitis- (present on admission) CT chest 2/7-showed new finding of peripancreatic edema suggestive of necrotizing infection or fistula-started on Unasyn 2/7 CT pancreas done 2/9-showing signs of severe pancreatitis also some locules of gas adjacent to the duodenum-as well as other findings see the report, antibiotic changed to meropenem, underwent CT of the abd w/ po contrast-no obvious perforation or source of gas from at this time -GI following closely.  IV fluids stopped due to generalized edema.  Tolerating diet having bowel movement LFTs, renal function stable-symptomatically improved -WBC count continues to uptrend but afebrile remains on meropenem.   - 2/11-discussed with GI, ID and with Dr. Dwaine Gale from IR there is no fluid to aspirate for culture. -Continue current plan of care as per GI, diet-low-fat, pain management.    Acute respiratory failure with hypoxia (HCC) Multifactorial in the setting of severe pancreatitis, morbid obesity OSA, bilateral basilar consolidation or atelectasis. chest CT 2/7 bilateral basilar consolidation or atelectasis, got Lasix 60 mg iv 2 2/7. BNP slightly elevated.Echo shows EF 60 to 65%, no RWMA, moderate LVH -Low suspicion of PE as he is on IV heparin already for A-fib  Not needing BiPAP anymore, HFNC transition to nasal cannula, currently at 3 L, CPAP bedtime.  Off IV fluids due to edema.  If any respiratory issues can use Lasix  BUT holding for now . -He is ambulating, increases OOB I-S ambulation -UOP picking up Net IO Since Admission: 3,188.59 mL [11/04/21 0827]  Filed Weights   11/02/21 0500 11/03/21 0500 11/04/21 0500  Weight: 124.3 kg 128.7 kg 128 kg  Bandemia- (present on admission) Bandemia likely in the setting of pancreatitis.  Initial leukocytosis had resolved but now uptrending, but afebrile, continue with meropenem.   Minimize pulmonary complications I-S.  We will check procalcitonin Recent Labs  Lab 10/31/21 0400 11/01/21 0351 11/02/21 0309 11/03/21 0554 11/04/21 0318  WBC 8.6 9.3 13.5* 23.5* 26.3*    Paroxysmal atrial fibrillation (HCC)- (present on admission) Now rate controlled after resuming oral Cardizem 240 mg am,-If remains poorly controlled can also add his home 120 mg in the evening Cont prn IV Lopressor.  Continue heparin anticoagulation.   Metabolic acidosis Bicarb is stable,monitor  AKI (acute kidney injury) ([place]) Patient not having good urine output with negative balance.  AKI stable and resolved.Continue Flomax  Intake/Output Summary (Last 24 hours) at 11/04/2021 0829 Last data filed at 11/04/2021 0756 Gross per 24 hour  Intake 2055.05 ml  Output 2450 ml  Net -394.95 ml   Recent Labs  Lab 10/31/21 0400 11/01/21 0351 11/02/21 0309 11/03/21 0554 11/04/21 0318  BUN 37* 20 15 12 10   CREATININE 1.09 0.87 0.85 0.75 0.70    Obesity with sleep apnea Will benefit with PCP follow-up, weight loss.  On CPAP at home.  Currently needing BiPAP here bedtime> now on CPAP bedtime  Polycythemia Likely from hemconcentration and sleep apnea related.  Resolved    Lactic acidosis In the setting of pancreatitis and dehydration. Resolved.   Recent Labs  Lab 10/30/21 0602 10/30/21 0806 10/31/21 0400  LATICACIDVEN 2.3* 2.9* 1.6     Transaminitis with hyperbilirubinemia- (present on admission) Pattern consistent with alcohol or rhabdo but CK within normal.  Improved.  Hypokalemia- (present on admission) We will replete iv. Recent Labs  Lab 10/31/21 0400 11/01/21 0351 11/02/21 0309 11/03/21 0554 11/04/21 0318  K 3.8 3.5 3.2* 3.3* 3.2*    Mixed hyperlipidemia- (present on admission) Holding statin  Gastroesophageal reflux disease- (present on admission) Fluid within distal esophagus suggesting GERD as he reports heartburn.Cont PPI bid.  Sleep apnea usese CPAP at home.  Here cont BiPAP  Essential hypertension- (present on admission) BP is controlled, continue to hold losartan continue Cardizem.    DVT prophylaxis: SCDs Start: 10/28/21 0322 heaprin gtt Code Status:   Code Status: Full Code Family Communication: plan of care discussed with patient/ his wife and family at the bedside.   Disposition: Currently not medically stable for discharge. Status is: Inpatient, stepdown unit. Remains inpatient appropriate because: Ongoing management of respiratory failure Pancreatitis.Continue to monitor in the stepdown unit high risk of decompensation.   Objective: Vitals last 24 hrs: Vitals:   11/04/21 0600 11/04/21 0728 11/04/21 0800 11/04/21 0851  BP: 125/71  (!) 143/86   Pulse: (!) 105 95 95 (!) 108  Resp: 20 (!) 23 (!) 21 19  Temp:  98.1 F (36.7 C)    TempSrc:  Oral    SpO2: 95% 94% 92% 96%  Weight:      Height:       Weight change: -0.7 kg  Physical Examination: General exam: AA0x3, obese, pleasant, older than stated age, weak appearing. HEENT:Oral mucosa moist, Ear/Nose WNL grossly, dentition normal. Respiratory system: bilaterally diminished,no use of accessory muscle Cardiovascular system: S1 & S2 +, No JVD,. Gastrointestinal system: Abdomen soft, distended more than baseline, minimal tenderness, BS+ Nervous System:Alert, awake, moving extremities and grossly nonfocal Extremities: edema neg,distal peripheral pulses palpable.  Skin: No rashes,no icterus. MSK: Normal muscle bulk,tone, power    Medications reviewed:  Scheduled Meds:  baclofen  5 mg Oral TID  brimonidine  1 drop Right Eye BID   Chlorhexidine Gluconate Cloth  6 each Topical Daily   diltiazem  240 mg Oral Daily   pantoprazole (PROTONIX) IV  40 mg Intravenous Q12H   polyethylene glycol  17 g Oral BID   potassium chloride  40 mEq Oral Once   tamsulosin  0.8 mg Oral QPM   Continuous Infusions:  heparin 2,700 Units/hr (11/04/21 0830)   meropenem (MERREM) IV 1 g (11/04/21  5093)     Diet Order             Diet Heart Room service appropriate? Yes; Fluid consistency: Thin  Diet effective now                    Intake/Output Summary (Last 24 hours) at 11/04/2021 1022 Last data filed at 11/04/2021 0756 Gross per 24 hour  Intake 1815.05 ml  Output 2450 ml  Net -634.95 ml   Net IO Since Admission: 3,188.59 mL [11/04/21 1022]  Wt Readings from Last 3 Encounters:  11/04/21 128 kg  05/15/21 113.4 kg  04/20/21 115.8 kg     Unresulted Labs (From admission, onward)     Start     Ordered   11/05/21 0500  Procalcitonin  Daily,   R     Question:  Specimen collection method  Answer:  Lab=Lab collect   11/04/21 0831   11/04/21 1400  Heparin level (unfractionated)  Once-Timed,   TIMED       Question:  Specimen collection method  Answer:  Lab=Lab collect   11/04/21 0757   11/04/21 0500  CBC  Daily,   R     Question:  Specimen collection method  Answer:  Lab=Lab collect   11/03/21 1142   11/04/21 0500  Comprehensive metabolic panel  Daily,   R     Question:  Specimen collection method  Answer:  Lab=Lab collect   11/03/21 1142   11/01/21 0500  Heparin level (unfractionated)  Daily,   R      10/30/21 1903          Data Reviewed: I have personally reviewed following labs and imaging studies CBC: Recent Labs  Lab 10/31/21 0400 11/01/21 0351 11/02/21 0309 11/03/21 0554 11/04/21 0318  WBC 8.6 9.3 13.5* 23.5* 26.3*  HGB 13.5 12.8* 12.5* 13.5 12.8*  HCT 40.7 38.3* 37.6* 40.7 38.2*  MCV 97.4 96.5 96.4 93.6 94.1  PLT 220 222 212 234 267   Basic Metabolic Panel: Recent Labs  Lab 10/29/21 0527 10/30/21 0111 10/30/21 0602 10/31/21 0400 11/01/21 0351 11/02/21 0309 11/03/21 0554 11/04/21 0318  NA 139 137 135 133* 133* 134* 133* 133*  K 4.8 3.9 4.0 3.8 3.5 3.2* 3.3* 3.2*  CL 111 110 110 108 106 105 101 103  CO2 20* 17* 17* 18* 20* 20* 20* 21*  GLUCOSE 161* 137* 141* 135* 143* 117* 150* 152*  BUN 38* 43* 42* 37* 20 15 12 10   CREATININE 1.77*  1.45* 1.33* 1.09 0.87 0.85 0.75 0.70  CALCIUM 7.4* 6.9* 6.9* 7.4* 7.5* 7.6* 7.9* 7.5*  MG 2.3 2.1 2.2  --   --   --   --  1.7  PHOS 3.6  --  2.8  --   --   --   --   --    GFR: Estimated Creatinine Clearance: 110.5 mL/min (by C-G formula based on SCr of 0.7 mg/dL). Liver Function Tests: Recent Labs  Lab 10/31/21 0400 11/01/21 0351 11/02/21 0309 11/03/21 0554 11/04/21  0318  AST 19 21 23 23 24   ALT 24 22 22 22 20   ALKPHOS 47 52 54 66 63  BILITOT 1.0 1.2 1.4* 1.3* 0.9  PROT 5.3* 5.0* 4.9* 5.4* 4.9*  ALBUMIN 2.4* 2.2* 2.1* 2.1* 1.9*   Recent Labs  Lab 10/29/21 0527 10/30/21 0602 10/31/21 0400  LIPASE 551* 144* 47   Recent Labs  Lab 10/29/21 0527 10/30/21 0602  AMMONIA 25 32   Coagulation Profile: No results for input(s): INR, PROTIME in the last 168 hours. Cardiac Enzymes: Recent Labs  Lab 10/30/21 0602  CKTOTAL 78   BNP (last 3 results) No results for input(s): PROBNP in the last 8760 hours. HbA1C: No results for input(s): HGBA1C in the last 72 hours. CBG: No results for input(s): GLUCAP in the last 168 hours. Lipid Profile: No results for input(s): CHOL, HDL, LDLCALC, TRIG, CHOLHDL, LDLDIRECT in the last 72 hours. Thyroid Function Tests: No results for input(s): TSH, T4TOTAL, FREET4, T3FREE, THYROIDAB in the last 72 hours. Anemia Panel: No results for input(s): VITAMINB12, FOLATE, FERRITIN, TIBC, IRON, RETICCTPCT in the last 72 hours. Sepsis Labs: Recent Labs  Lab 10/29/21 1029 10/30/21 0602 10/30/21 0806 10/31/21 0400 11/04/21 0318  PROCALCITON  --   --   --   --  1.62  LATICACIDVEN 3.2* 2.3* 2.9* 1.6  --     Recent Results (from the past 240 hour(s))  Resp Panel by RT-PCR (Flu A&B, Covid) Nasopharyngeal Swab     Status: None   Collection Time: 10/28/21  3:00 AM   Specimen: Nasopharyngeal Swab; Nasopharyngeal(NP) swabs in vial transport medium  Result Value Ref Range Status   SARS Coronavirus 2 by RT PCR NEGATIVE NEGATIVE Final    Comment:  (NOTE) SARS-CoV-2 target nucleic acids are NOT DETECTED.  The SARS-CoV-2 RNA is generally detectable in upper respiratory specimens during the acute phase of infection. The lowest concentration of SARS-CoV-2 viral copies this assay can detect is 138 copies/mL. A negative result does not preclude SARS-Cov-2 infection and should not be used as the sole basis for treatment or other patient management decisions. A negative result may occur with  improper specimen collection/handling, submission of specimen other than nasopharyngeal swab, presence of viral mutation(s) within the areas targeted by this assay, and inadequate number of viral copies(<138 copies/mL). A negative result must be combined with clinical observations, patient history, and epidemiological information. The expected result is Negative.  Fact Sheet for Patients:  EntrepreneurPulse.com.au  Fact Sheet for Healthcare Providers:  IncredibleEmployment.be  This test is no t yet approved or cleared by the [place] FDA and  has been authorized for detection and/or diagnosis of SARS-CoV-2 by FDA under an Emergency Use Authorization (EUA). This EUA will remain  in effect (meaning this test can be used) for the duration of the COVID-19 declaration under Section 564(b)(1) of the Act, 21 U.S.C.section 360bbb-3(b)(1), unless the authorization is terminated  or revoked sooner.       Influenza A by PCR NEGATIVE NEGATIVE Final   Influenza B by PCR NEGATIVE NEGATIVE Final    Comment: (NOTE) The Xpert Xpress SARS-CoV-2/FLU/RSV plus assay is intended as an aid in the diagnosis of influenza from Nasopharyngeal swab specimens and should not be used as a sole basis for treatment. Nasal washings and aspirates are unacceptable for Xpert Xpress SARS-CoV-2/FLU/RSV testing.  Fact Sheet for Patients: EntrepreneurPulse.com.au  Fact Sheet for Healthcare  Providers: IncredibleEmployment.be  This test is not yet approved or cleared by the [place] FDA and has been  authorized for detection and/or diagnosis of SARS-CoV-2 by FDA under an Emergency Use Authorization (EUA). This EUA will remain in effect (meaning this test can be used) for the duration of the COVID-19 declaration under Section 564(b)(1) of the Act, 21 U.S.C. section 360bbb-3(b)(1), unless the authorization is terminated or revoked.  Performed at [facility], [address] [place], [place] [address]   MRSA Next Gen by PCR, Nasal     Status: None   Collection Time: 10/30/21  5:46 PM   Specimen: Nasal Mucosa; Nasal Swab  Result Value Ref Range Status   MRSA by PCR Next Gen NOT DETECTED NOT DETECTED Final    Comment: (NOTE) The GeneXpert MRSA Assay (FDA approved for NASAL specimens only), is one component of a comprehensive MRSA colonization surveillance program. It is not intended to diagnose MRSA infection nor to guide or monitor treatment for MRSA infections. Test performance is not FDA approved in patients less than 4 years old. Performed at [facility], [address] [place], [place] [address]     Antimicrobials: Anti-infectives (From admission, onward)    Start     Dose/Rate Route Frequency Ordered Stop   11/01/21 1600  meropenem (MERREM) 1 g in sodium chloride 0.9 % 100 mL IVPB        1 g 200 mL/hr over 30 Minutes Intravenous Every 8 hours 11/01/21 1454     10/30/21 2100  Ampicillin-Sulbactam (UNASYN) 3 g in sodium chloride 0.9 % 100 mL IVPB  Status:  Discontinued        3 g 200 mL/hr over 30 Minutes Intravenous Every 6 hours 10/30/21 2024 11/01/21 1453      Culture/Microbiology No results found for: SDES, [place], [place], REPTSTATUS   Radiology Studies: No results found.   LOS: 7 days   Antonieta Pert, MD [facility]  11/04/2021, 10:22 AM

## 2021-11-05 ENCOUNTER — Other Ambulatory Visit: Payer: Self-pay | Admitting: Cardiology

## 2021-11-05 LAB — COMPREHENSIVE METABOLIC PANEL
ALT: 24 U/L (ref 0–44)
AST: 30 U/L (ref 15–41)
Albumin: 1.9 g/dL — ABNORMAL LOW (ref 3.5–5.0)
Alkaline Phosphatase: 64 U/L (ref 38–126)
Anion gap: 12 (ref 5–15)
BUN: 10 mg/dL (ref 8–23)
CO2: 21 mmol/L — ABNORMAL LOW (ref 22–32)
Calcium: 7.6 mg/dL — ABNORMAL LOW (ref 8.9–10.3)
Chloride: 100 mmol/L (ref 98–111)
Creatinine, Ser: 0.67 mg/dL (ref 0.61–1.24)
GFR, Estimated: >60 mL/min (ref >60)
Glucose, Bld: 145 mg/dL — ABNORMAL HIGH (ref 70–99)
Potassium: 3.3 mmol/L — ABNORMAL LOW (ref 3.5–5.1)
Sodium: 133 mmol/L — ABNORMAL LOW (ref 135–145)
Total Bilirubin: 0.8 mg/dL (ref 0.3–1.2)
Total Protein: 5 g/dL — ABNORMAL LOW (ref 6.5–8.1)

## 2021-11-05 LAB — CBC
HCT: 37.1 % — ABNORMAL LOW (ref 39.0–52.0)
Hemoglobin: 12.7 g/dL — ABNORMAL LOW (ref 13.0–17.0)
MCH: 32.3 pg (ref 26.0–34.0)
MCHC: 34.2 g/dL (ref 30.0–36.0)
MCV: 94.4 fL (ref 80.0–100.0)
Platelets: 328 10*3/uL (ref 150–400)
RBC: 3.93 MIL/uL — ABNORMAL LOW (ref 4.22–5.81)
RDW: 13.3 % (ref 11.5–15.5)
WBC: 24.3 10*3/uL — ABNORMAL HIGH (ref 4.0–10.5)
nRBC: 0.2 % (ref 0.0–0.2)

## 2021-11-05 LAB — HEPARIN LEVEL (UNFRACTIONATED): Heparin Unfractionated: 0.2 IU/mL — ABNORMAL LOW (ref 0.30–0.70)

## 2021-11-05 LAB — LIPASE, BLOOD: Lipase: 32 U/L (ref 11–51)

## 2021-11-05 LAB — PROCALCITONIN: Procalcitonin: 1.2 ng/mL

## 2021-11-05 MED ORDER — METOCLOPRAMIDE HCL 5 MG/ML IJ SOLN
5.0000 mg | Freq: Four times a day (QID) | INTRAMUSCULAR | Status: DC | PRN
  Administered 2021-11-05: 5 mg via INTRAVENOUS
  Filled 2021-11-05 (×2): qty 2

## 2021-11-05 MED ORDER — BACLOFEN 10 MG PO TABS
5.0000 mg | ORAL_TABLET | Freq: Once | ORAL | Status: AC | PRN
  Administered 2021-11-06: 5 mg via ORAL
  Filled 2021-11-05: qty 1

## 2021-11-05 MED ORDER — APIXABAN 5 MG PO TABS
5.0000 mg | ORAL_TABLET | Freq: Two times a day (BID) | ORAL | Status: DC
  Administered 2021-11-05 – 2021-11-12 (×15): 5 mg via ORAL
  Filled 2021-11-05 (×15): qty 1

## 2021-11-05 MED ORDER — POTASSIUM CHLORIDE CRYS ER 20 MEQ PO TBCR
40.0000 meq | EXTENDED_RELEASE_TABLET | Freq: Once | ORAL | Status: AC
  Administered 2021-11-05: 40 meq via ORAL
  Filled 2021-11-05: qty 2

## 2021-11-05 NOTE — Progress Notes (Signed)
[place] for Heparin >> apixaban Indication: atrial fibrillation  No Known Allergies  Patient Measurements: Height: 5\' 10"  (177.8 cm) Weight: 129.5 kg (285 lb 7.9 oz) IBW/kg (Calculated) : 73 HEPARIN DW (KG): 100.9   Vital Signs: Temp: 97.6 F (36.4 C) (02/13 0757) Temp Source: Oral (02/13 0757) BP: 142/74 (02/13 0800) Pulse Rate: 100 (02/13 0800)  Labs: Recent Labs    11/03/21 0554 11/04/21 0318 11/04/21 1427 11/05/21 0351  HGB 13.5 12.8*  --  12.7*  HCT 40.7 38.2*  --  37.1*  PLT 234 271  --  328  HEPARINUNFRC 0.34 0.29* 0.45 0.20*  CREATININE 0.75 0.70  --  0.67     Estimated Creatinine Clearance: 111.2 mL/min (by C-G formula based on SCr of 0.67 mg/dL).   Assessment: Patient admitted with acute pancreatitis. He is chronically anticoagulated with eliquis for afib. Last dose was 2/4 at 0800.   Transitioning back from heparin to apixaban CBC WNL  Goal of Therapy:   Monitor platelets by anticoagulation protocol: Yes   Plan:  Stop heparin infusion Start apixaban 5 mg twice daily Monitor H&H and s/s of bleeding.   Margot Ables, PharmD Clinical Pharmacist 11/05/2021 8:50 AM

## 2021-11-05 NOTE — Progress Notes (Signed)
Pharmacy Antibiotic Note  Sean Golden is a 76 y.o. male admitted on 10/27/2021 with  acute necrotizing pancreatitis .  Pharmacy has been consulted for meropenem dosing.  Plan: Meropenem 1000 mg IV every 8 hours. Plan is for 7 days of treatment. Monitor labs, c/s, and patient improvement.  Height: 5\' 10"  (177.8 cm) Weight: 129.5 kg (285 lb 7.9 oz) IBW/kg (Calculated) : 73  Temp (24hrs), Avg:98.1 F (36.7 C), Min:97.6 F (36.4 C), Max:98.3 F (36.8 C)  Recent Labs  Lab 10/29/21 1029 10/30/21 0111 10/30/21 0602 10/30/21 0806 10/31/21 0400 11/01/21 0351 11/02/21 0309 11/03/21 0554 11/04/21 0318 11/05/21 0351  WBC  --    < > 8.5  --  8.6 9.3 13.5* 23.5* 26.3* 24.3*  CREATININE  --    < > 1.33*  --  1.09 0.87 0.85 0.75 0.70 0.67  LATICACIDVEN 3.2*  --  2.3* 2.9* 1.6  --   --   --   --   --    < > = values in this interval not displayed.     Estimated Creatinine Clearance: 111.2 mL/min (by C-G formula based on SCr of 0.67 mg/dL).    No Known Allergies  Antimicrobials this admission: Merrem 2/9 >> Unasyn 2/7 >> 2/9  Microbiology results:  2/7 MRSA PCR: negative  Thank you for allowing pharmacy to be a part of this patients care.  Margot Ables, PharmD Clinical Pharmacist 11/05/2021 8:56 AM

## 2021-11-05 NOTE — Progress Notes (Signed)
Patient ambulated around the unit with front wheel walker and assistance from wife.

## 2021-11-05 NOTE — Progress Notes (Signed)
PROGRESS NOTE Sean Golden  QPY:[id number] DOB: [birth year] DOA: 10/27/2021 PCP: Sean Squibb, MD   Brief Narrative/Hospital Course: Sean Golden, 76 y.o. male 76 year old M with PMH of A-fib on Eliquis, HTN, GERD/erosive esophagitis, H. Pylori and OSA presenting with acute onset cramping epigastric abdominal pain radiating to his right abdomen after he had a dinner, and admitted for acute pancreatitis.  Lipase elevated to 1680.  Mild LFT elevation with pattern consistent with EtOH or rhabdo but CK within normal.  Patient admits to drinking 2 to 3 glasses of wine before bedtime.  CTA C/A/P concerning for acute pancreatitis without necrosis or pseudocyst.  CT also showed fluid distention of the stomach and esophagus concerning for GOO or an GERD.  Started on IV fluid, IV analgesics and antiemetics, and admitted.  GI consulted.    RUQ [place] negative for gallstone or signs of acute cholecystitis but hepatic steatosis.  Pancreatitis improving clinically and chemically.  However, patient developed respiratory distress with hypoxia requiring supplemental oxygen likely from atelectasis and IV fluid.  IV fluid discontinued.  Started on IV Lasix. CT chest 2/7-New development of bilateral basilar consolidation or atelectasis in the lungs, New finding of peripancreatic gas suggesting necrotizing infection or fistula. Patient further completed CT abdomen. 2/11-although clinically stable had bump in WBC count discussed with ID, IR and GI-for possible aspiration but no fluid to aspirate as per IR, continued on IV meropenem-with plan to complete 7 days course. Gi signed off 2/12   Subjective: Seen and examined this morning.  Has no complaints.  He thinks he Felt better after CPAP.  He has been ambulating, this morning on room air Overnight no fever, heart rate relatively stable, used CPAP last night. Labs now showing slightly improving WBC count.  Assessment and Plan: * Acute necrotizing pancreatitis- (present on  admission) CT chest 2/7-showed new finding of peripancreatic edema suggestive of necrotizing infection or fistula-started on Unasyn 2/7 CT pancreas done 2/9-showing signs of severe pancreatitis also some locules of gas adjacent to the duodenum-as well as other findings see the report, antibiotic changed to meropenem, underwent CT of the abd w/ po contrast-no obvious perforation or source of gas from at this time -GI following closely.  IV fluids stopped due to generalized edema. -Patient is clinically improved, he is voiding, LFTs stable tolerating diet and having BM.  -having leukocytosis-antibiotic was changed to meropenem, on 2/11-discussed with GI, ID and with Dr. Dwaine Gale from IR there is no fluid to aspirate for culture and continue antibiotics to complete 7 days meropenem. -Having hiccups-but better after CPAP,continue supportive care. GI s/o 2/12   Bandemia- (present on admission) Bandemia likely in the setting of pancreatitis.  Initial leukocytosis had resolved but started uptrending, now improving.  See acute pancreatitis.procalcitonin improving. Recent Labs  Lab 10/29/21 1029 10/30/21 0602 10/30/21 0602 10/30/21 0806 10/31/21 0400 11/01/21 0351 11/02/21 0309 11/03/21 0554 11/04/21 0318 11/05/21 0351  WBC  --  8.5   < >  --  8.6 9.3 13.5* 23.5* 26.3* 24.3*  LATICACIDVEN 3.2* 2.3*  --  2.9* 1.6  --   --   --   --   --   PROCALCITON  --   --   --   --   --   --   --   --  1.62 1.20   < > = values in this interval not displayed.    Acute respiratory failure with hypoxia (HCC) Multifactorial in the setting of severe pancreatitis, morbid obesity OSA, bilateral  basilar consolidation or atelectasis.  Has had CT chest 2/7 along with echo with normal EF Continue CPAP bedtime, and wean nasal cannula as tolerated.  PT OT and ambulation.  Paroxysmal atrial fibrillation ([place])- (present on admission) Rate controlled on Cardizem 240 daily, at home additionally he is not 120 in evening.  On  heparin > will transition to Eliquis as he is tolerating p.o.  Metabolic acidosis Bicarb is stable,monitor  AKI (acute kidney injury) ([place]) Resolved.    Obesity with sleep apnea Will benefit with PCP follow-up, weight loss.  On CPAP at home.  Currently needing BiPAP here bedtime> now on CPAP bedtime  Polycythemia Likely from hemconcentration and sleep apnea related.  Resolved    Lactic acidosis In the setting of pancreatitis and dehydration. Resolved.   Transaminitis with hyperbilirubinemia- (present on admission) Pattern consistent with alcohol or rhabdo but CK within normal.  Improved.  Hypokalemia- (present on admission) We will replete PO. Recent Labs  Lab 11/01/21 0351 11/02/21 0309 11/03/21 0554 11/04/21 0318 11/05/21 0351  K 3.5 3.2* 3.3* 3.2* 3.3*    Mixed hyperlipidemia- (present on admission) Holding statin  Gastroesophageal reflux disease- (present on admission) Fluid within distal esophagus suggesting GERD as he reports heartburn.Cont PPI bid.  Sleep apnea usese CPAP at home. Here cont BiPAP  Essential hypertension- (present on admission) BP is controlled, continue to hold losartan continue Cardizem.    DVT prophylaxis: SCDs Start: 10/28/21 0322 heaprin gtt Code Status:   Code Status: Full Code Family Communication: plan of care discussed with patient/ his wife and family at the bedside.   Disposition: Currently not medically stable for discharge. Status is: Inpatient, stepdown unit. Remains inpatient appropriate because: Ongoing management of acute pancreatitis.  Okay to transfer to telemetry.   Pancreatitis.Continue to monitor in the stepdown unit high risk of decompensation.   Objective: Vitals last 24 hrs: Vitals:   11/05/21 0600 11/05/21 0757 11/05/21 0800 11/05/21 0909  BP: 111/72  (!) 142/74   Pulse: 75  100   Resp: 12   19  Temp:  97.6 F (36.4 C)    TempSrc:  Oral    SpO2: 96%   95%  Weight:      Height:       Weight change:  1.5 kg  Physical Examination: General exam: Aa0x3, pleasant, older than stated age, weak appearing. HEENT:Oral mucosa moist, Ear/Nose WNL grossly, dentition normal. Respiratory system: bilateral basal crackles, no use of accessory muscle Cardiovascular system: S1 & S2 +, No JVD,. Gastrointestinal system: Abdomen soft,NT,ND,BS+ Nervous System:Alert, awake, moving extremities and grossly nonfocal Extremities: LE ankle w/ mild edema, distal peripheral pulses palpable.  Skin: No rashes,no icterus. MSK: Normal muscle bulk,tone, power   Medications reviewed:  Scheduled Meds:  apixaban  5 mg Oral BID   brimonidine  1 drop Right Eye BID   Chlorhexidine Gluconate Cloth  6 each Topical Daily   diltiazem  240 mg Oral Daily   pantoprazole (PROTONIX) IV  40 mg Intravenous Q12H   polyethylene glycol  17 g Oral BID   tamsulosin  0.8 mg Oral QPM   Continuous Infusions:  meropenem (MERREM) IV Stopped (11/05/21 0803)     Diet Order             Diet Heart Room service appropriate? Yes; Fluid consistency: Thin  Diet effective now                    Intake/Output Summary (Last 24 hours) at 11/05/2021 1021 Last data  filed at 11/05/2021 8502 Gross per 24 hour  Intake 1335.07 ml  Output 2000 ml  Net -664.93 ml   Net IO Since Admission: 2,523.66 mL [11/05/21 1021]  Wt Readings from Last 3 Encounters:  11/05/21 129.5 kg  05/15/21 113.4 kg  04/20/21 115.8 kg     Unresulted Labs (From admission, onward)     Start     Ordered   11/05/21 0500  Procalcitonin  Daily,   R     Question:  Specimen collection method  Answer:  Lab=Lab collect   11/04/21 0831   11/04/21 0500  CBC  Daily,   R     Question:  Specimen collection method  Answer:  Lab=Lab collect   11/03/21 1142   11/04/21 0500  Comprehensive metabolic panel  Daily,   R     Question:  Specimen collection method  Answer:  Lab=Lab collect   11/03/21 1142          Data Reviewed: I have personally reviewed following labs and  imaging studies CBC: Recent Labs  Lab 11/01/21 0351 11/02/21 0309 11/03/21 0554 11/04/21 0318 11/05/21 0351  WBC 9.3 13.5* 23.5* 26.3* 24.3*  HGB 12.8* 12.5* 13.5 12.8* 12.7*  HCT 38.3* 37.6* 40.7 38.2* 37.1*  MCV 96.5 96.4 93.6 94.1 94.4  PLT 222 212 234 271 774   Basic Metabolic Panel: Recent Labs  Lab 10/30/21 0111 10/30/21 0602 10/31/21 0400 11/01/21 0351 11/02/21 0309 11/03/21 0554 11/04/21 0318 11/05/21 0351  NA 137 135   < > 133* 134* 133* 133* 133*  K 3.9 4.0   < > 3.5 3.2* 3.3* 3.2* 3.3*  CL 110 110   < > 106 105 101 103 100  CO2 17* 17*   < > 20* 20* 20* 21* 21*  GLUCOSE 137* 141*   < > 143* 117* 150* 152* 145*  BUN 43* 42*   < > 20 15 12 10 10   CREATININE 1.45* 1.33*   < > 0.87 0.85 0.75 0.70 0.67  CALCIUM 6.9* 6.9*   < > 7.5* 7.6* 7.9* 7.5* 7.6*  MG 2.1 2.2  --   --   --   --  1.7  --   PHOS  --  2.8  --   --   --   --   --   --    < > = values in this interval not displayed.   GFR: Estimated Creatinine Clearance: 111.2 mL/min (by C-G formula based on SCr of 0.67 mg/dL). Liver Function Tests: Recent Labs  Lab 11/01/21 0351 11/02/21 0309 11/03/21 0554 11/04/21 0318 11/05/21 0351  AST 21 23 23 24 30   ALT 22 22 22 20 24   ALKPHOS 52 54 66 63 64  BILITOT 1.2 1.4* 1.3* 0.9 0.8  PROT 5.0* 4.9* 5.4* 4.9* 5.0*  ALBUMIN 2.2* 2.1* 2.1* 1.9* 1.9*   Recent Labs  Lab 10/30/21 0602 10/31/21 0400 11/05/21 0351  LIPASE 144* 47 32   Recent Labs  Lab 10/30/21 0602  AMMONIA 32   Coagulation Profile: No results for input(s): INR, PROTIME in the last 168 hours. Cardiac Enzymes: Recent Labs  Lab 10/30/21 0602  CKTOTAL 78  Sepsis Labs: Recent Labs  Lab 10/29/21 1029 10/30/21 0602 10/30/21 0806 10/31/21 0400 11/04/21 0318 11/05/21 0351  PROCALCITON  --   --   --   --  1.62 1.20  LATICACIDVEN 3.2* 2.3* 2.9* 1.6  --   --   Antimicrobials: Anti-infectives (From admission, onward)    Start  Dose/Rate Route Frequency Ordered Stop   11/01/21  1600  meropenem (MERREM) 1 g in sodium chloride 0.9 % 100 mL IVPB        1 g 200 mL/hr over 30 Minutes Intravenous Every 8 hours 11/01/21 1454     10/30/21 2100  Ampicillin-Sulbactam (UNASYN) 3 g in sodium chloride 0.9 % 100 mL IVPB  Status:  Discontinued        3 g 200 mL/hr over 30 Minutes Intravenous Every 6 hours 10/30/21 2024 11/01/21 1453      Culture/Microbiology No results found for: SDES, [place], [place], REPTSTATUS   Radiology Studies: No results found.   LOS: 8 days   Antonieta Pert, MD [facility]  11/05/2021, 10:21 AM

## 2021-11-06 DIAGNOSIS — R066 Hiccough: Secondary | ICD-10-CM

## 2021-11-06 DIAGNOSIS — E8770 Fluid overload, unspecified: Secondary | ICD-10-CM

## 2021-11-06 LAB — PROCALCITONIN: Procalcitonin: 0.96 ng/mL

## 2021-11-06 LAB — COMPREHENSIVE METABOLIC PANEL
ALT: 22 U/L (ref 0–44)
AST: 28 U/L (ref 15–41)
Albumin: 1.8 g/dL — ABNORMAL LOW (ref 3.5–5.0)
Alkaline Phosphatase: 64 U/L (ref 38–126)
Anion gap: 11 (ref 5–15)
BUN: 12 mg/dL (ref 8–23)
CO2: 20 mmol/L — ABNORMAL LOW (ref 22–32)
Calcium: 7.7 mg/dL — ABNORMAL LOW (ref 8.9–10.3)
Chloride: 102 mmol/L (ref 98–111)
Creatinine, Ser: 0.69 mg/dL (ref 0.61–1.24)
GFR, Estimated: >60 mL/min (ref >60)
Glucose, Bld: 143 mg/dL — ABNORMAL HIGH (ref 70–99)
Potassium: 3.8 mmol/L (ref 3.5–5.1)
Sodium: 133 mmol/L — ABNORMAL LOW (ref 135–145)
Total Bilirubin: 0.9 mg/dL (ref 0.3–1.2)
Total Protein: 4.9 g/dL — ABNORMAL LOW (ref 6.5–8.1)

## 2021-11-06 LAB — CBC
HCT: 37.4 % — ABNORMAL LOW (ref 39.0–52.0)
Hemoglobin: 12.7 g/dL — ABNORMAL LOW (ref 13.0–17.0)
MCH: 32.7 pg (ref 26.0–34.0)
MCHC: 34 g/dL (ref 30.0–36.0)
MCV: 96.4 fL (ref 80.0–100.0)
Platelets: 374 10*3/uL (ref 150–400)
RBC: 3.88 MIL/uL — ABNORMAL LOW (ref 4.22–5.81)
RDW: 13.7 % (ref 11.5–15.5)
WBC: 19.2 10*3/uL — ABNORMAL HIGH (ref 4.0–10.5)
nRBC: 0 % (ref 0.0–0.2)

## 2021-11-06 MED ORDER — FUROSEMIDE 10 MG/ML IJ SOLN
40.0000 mg | Freq: Two times a day (BID) | INTRAMUSCULAR | Status: DC
  Administered 2021-11-06 – 2021-11-07 (×2): 40 mg via INTRAVENOUS
  Filled 2021-11-06 (×2): qty 4

## 2021-11-06 MED ORDER — SODIUM CHLORIDE 0.9 % IV SOLN
6.2500 mg | Freq: Once | INTRAVENOUS | Status: AC
  Administered 2021-11-06: 6.25 mg via INTRAVENOUS
  Filled 2021-11-06: qty 0.25

## 2021-11-06 MED ORDER — CHLORPROMAZINE HCL 25 MG PO TABS
25.0000 mg | ORAL_TABLET | Freq: Three times a day (TID) | ORAL | Status: DC | PRN
  Administered 2021-11-06 – 2021-11-07 (×2): 25 mg via ORAL
  Filled 2021-11-06 (×4): qty 1

## 2021-11-06 MED ORDER — ALUM & MAG HYDROXIDE-SIMETH 200-200-20 MG/5ML PO SUSP
30.0000 mL | Freq: Four times a day (QID) | ORAL | Status: DC | PRN
  Filled 2021-11-06: qty 30

## 2021-11-06 MED ORDER — FUROSEMIDE 10 MG/ML IJ SOLN
40.0000 mg | Freq: Once | INTRAMUSCULAR | Status: AC
  Administered 2021-11-06: 40 mg via INTRAVENOUS
  Filled 2021-11-06: qty 4

## 2021-11-06 NOTE — Progress Notes (Signed)
Physical Therapy Treatment Patient Details Name: Sean Golden MRN: [id number] DOB: Jan 25, 1946 Today's Date: 11/06/2021   History of Present Illness Sean Golden is a 76 y.o. male with medical history significant of with history of afib, essential hypertension, GERD, H. pylori, sleep apnea, and more presents the ED with a chief complaint of stomach pain.  Patient reports he had dinner and at 6:30 PM he had acute onset of severe, cramping epigastric pain.  After some time the pain started radiating to the right and down slightly to the level of the umbilicus.  Patient reports he has had nausea and dry heaves but no vomiting.  He has not had any loose stools.  His last normal bowel movement was the previous day.  Patient has never had pancreatitis before.  Patient has never had gallbladder concerns before.  He does not have any fevers.  He did have some hypoxia in the ER from breathing shallow because it hurts to take a deep breath, but has been maintaining his oxygen sats with 2 L nasal cannula.  He denies any melena, hematochezia, dysuria, hematuria.  Patient denies any trauma to his abdomen, change in medication, trips to the [facility], familial lipid disorders, but he does drink.  He drinks about 2 glasses of wine per evening.  Patient has no other complaints at this time.    PT Comments    Patient presents seat in chair and agreeable for therapy.  Patient demonstrates fair/good return for walking short distances without AD, but required use of RW for longer distances due to fatiguing easily and unsteadiness.  Patient on room air with SpO2 dropping from 97% to 85% when returning to room , after sitting with pursed lipped breathing SpO2 increased to 97% quickly while on room - RN notified.  Patient tolerated sitting up in chair after therapy.  Patient will benefit from continued skilled physical therapy in hospital and recommended venue below to increase strength, balance, endurance for safe ADLs and  gait.     Recommendations for follow up therapy are one component of a multi-disciplinary discharge planning process, led by the attending physician.  Recommendations may be updated based on patient status, additional functional criteria and insurance authorization.  Follow Up Recommendations  Home health PT     Assistance Recommended at Discharge PRN  Patient can return home with the following A little help with walking and/or transfers;A little help with bathing/dressing/bathroom;Help with stairs or ramp for entrance   Equipment Recommendations       Recommendations for Other Services       Precautions / Restrictions Precautions Precautions: Fall Restrictions Weight Bearing Restrictions: No     Mobility  Bed Mobility               General bed mobility comments: Patient presents seated in chair    Transfers Overall transfer level: Needs assistance Equipment used: Rolling walker (2 wheels), None Transfers: Sit to/from Stand, Bed to chair/wheelchair/BSC Sit to Stand: Supervision, Modified independent (Device/Increase time)   Step pivot transfers: Supervision       General transfer comment: slightly increased time, good return for completing sit to stands without AD    Ambulation/Gait Ambulation/Gait assistance: Supervision Gait Distance (Feet): 100 Feet Assistive device: None, Rolling walker (2 wheels) Gait Pattern/deviations: Decreased step length - right, Decreased step length - left, Decreased stride length Gait velocity: decreased     General Gait Details: good return for ambulating short distances without AD, but required use of RW due  to fatiguing easily without AD, demonstrates slightly labored cadence without loss of balance, on room air with SpO2 dropping from 97% to 85%   Stairs             Wheelchair Mobility    Modified Rankin (Stroke Patients Only)       Balance Overall balance assessment: Needs assistance Sitting-balance  support: Feet supported, No upper extremity supported Sitting balance-Leahy Scale: Good Sitting balance - Comments: seated at EOB   Standing balance support: During functional activity, No upper extremity supported Standing balance-Leahy Scale: Fair Standing balance comment: fair/good using RW                            Cognition Arousal/Alertness: Awake/alert Behavior During Therapy: WFL for tasks assessed/performed Overall Cognitive Status: Within Functional Limits for tasks assessed                                          Exercises      General Comments        Pertinent Vitals/Pain Pain Assessment Pain Assessment: No/denies pain    Home Living                          Prior Function            PT Goals (current goals can now be found in the care plan section) Acute Rehab PT Goals Patient Stated Goal: to return home PT Goal Formulation: With patient/family Time For Goal Achievement: 11/16/21 Potential to Achieve Goals: Good Progress towards PT goals: Progressing toward goals    Frequency    Min 2X/week      PT Plan Current plan remains appropriate    Co-evaluation              AM-PAC PT "6 Clicks" Mobility   Outcome Measure  Help needed turning from your back to your side while in a flat bed without using bedrails?: A Little Help needed moving from lying on your back to sitting on the side of a flat bed without using bedrails?: A Little Help needed moving to and from a bed to a chair (including a wheelchair)?: None Help needed standing up from a chair using your arms (e.g., wheelchair or bedside chair)?: None Help needed to walk in hospital room?: A Little Help needed climbing 3-5 steps with a railing? : A Little 6 Click Score: 20    End of Session   Activity Tolerance: Patient tolerated treatment well;Patient limited by fatigue Patient left: in chair;with family/visitor present;with call bell/phone  within reach Nurse Communication: Mobility status PT Visit Diagnosis: Unsteadiness on feet (R26.81);Other abnormalities of gait and mobility (R26.89);Muscle weakness (generalized) (M62.81)     Time: 6468-0321 PT Time Calculation (min) (ACUTE ONLY): 22 min  Charges:  $Gait Training: 8-22 mins $Therapeutic Activity: 8-22 mins                     3:22 PM, 11/06/21 Lonell Grandchild, MPT Physical Therapist with [facility] 336 [phone] office [phone] mobile phone

## 2021-11-06 NOTE — Progress Notes (Signed)
PROGRESS NOTE Sean Golden  MBT:[id number] DOB: 1946/09/23 DOA: 10/27/2021 PCP: Celene Squibb, MD   Brief Narrative/Hospital Course: Sean Golden, 76 y.o. male 76 year old M with PMH of A-fib on Eliquis, HTN, GERD/erosive esophagitis, H. Pylori and OSA presenting with acute onset cramping epigastric abdominal pain radiating to his right abdomen after he had a dinner, and admitted for acute pancreatitis.  Lipase elevated to 1680.  Mild LFT elevation with pattern consistent with EtOH or rhabdo but CK within normal.  Patient admits to drinking 2 to 3 glasses of wine before bedtime.  CTA C/A/P concerning for acute pancreatitis without necrosis or pseudocyst.  CT also showed fluid distention of the stomach and esophagus concerning for GOO or an GERD.  Started on IV fluid, IV analgesics and antiemetics, and admitted.  GI consulted.    RUQ [place] negative for gallstone or signs of acute cholecystitis but hepatic steatosis.  Pancreatitis improving clinically and chemically.  However, patient developed respiratory distress with hypoxia requiring supplemental oxygen likely from atelectasis and IV fluid.  IV fluid discontinued.  Started on IV Lasix. CT chest 2/7-New development of bilateral basilar consolidation or atelectasis in the lungs, New finding of peripancreatic gas suggesting necrotizing infection or fistula. Patient further completed CT abdomen.2/11-although clinically stable had bump in WBC count discussed with ID, IR and GI-for possible aspiration but no fluid to aspirate as per IR, continued on IV meropenem-with plan to complete 7 days course.GI signed off 2/12.  Subjective: Seen and examined.  On room air.  Tolerating diet.  Having constant hiccups wife is requesting for Thorazine.overnight temp of 100.5, on RA. HR,BP overall stable. On RA. Procal down to 0.9 Weight is up to 287 pounds normal at 260s at home.  Assessment and Plan: * Acute necrotizing pancreatitis- (present on admission) CT chest  2/7-new finding of peripancreatic edema suggestive of necrotizing infection or fistula-started on Unasyn 2/7-CT pancreas 2/9-signs of severe pancreatitis also some locules of gas adjacent to the duodenum-as well as other findings see the report, antibiotic changed to meropenem 2/9- and had CT of the abd w/ po contrast-no obvious perforation or source of gas from at this time. Now tolerating diet-low fat, clinically improving although hiccups bothering him.  Continue OOB, PTOT.Pain control. He had worsening leukocytosis and discussed with GI, ID and with Dr. Dwaine Gale from IR 2/11-no fluid to aspirate for culture. WBC now improving-will complete 7 days of antibiotics.   Bandemia- (present on admission) Bandemia likely in the setting of pancreatitis.  Initial leukocytosis had resolved but started uptrending, now improving,complete antibiotics.See acute pancreatitis.procalcitonin improving. Recent Labs  Lab 10/31/21 0400 11/01/21 0351 11/02/21 0309 11/03/21 0554 11/04/21 0318 11/05/21 0351 11/06/21 0444  WBC 8.6   < > 13.5* 23.5* 26.3* 24.3* 19.2*  LATICACIDVEN 1.6  --   --   --   --   --   --   PROCALCITON  --   --   --   --  1.62 1.20 0.96   < > = values in this interval not displayed.    Acute respiratory failure with hypoxia (HCC) Multifactorial in the setting of severe pancreatitis, morbid obesity OSA, bilateral basilar consolidation or atelectasis.Has had CT chest 2/7 along with echo with normal EF Continue CPAP bedtime, and wean nasal cannula as tolerated.  PT OT and ambulation.  Paroxysmal atrial fibrillation ([place])- (present on admission) Rate controlled on Cardizem 240 daily,at home additionally on 120 mg in evening. Heparin transitioned to Eliquis 11/06/19.  Fluid overload Patient appears  bloated and has lower leg edema, baseline weight around 260s prior to admission currently 287.  We will try Lasix 40 mg q12, reassess again.  Hiccups Suspect this is in the setting of abdominal  process also having fluid overload we will try Lasix, Maalox Phenergan if persistent will try Thorazine.  Metabolic acidosis Bicarb is stable,monitor  AKI (acute kidney injury) ([place]) Resolved.    Obesity with sleep apnea Will benefit with PCP follow-up, weight loss.  Cont home CPAP  Polycythemia Likely from hemconcentration and sleep apnea related.  Resolved    Lactic acidosis In the setting of pancreatitis and dehydration. Resolved.   Transaminitis with hyperbilirubinemia- (present on admission) Pattern consistent with alcohol or rhabdo but CK within normal.  Improved.  Hypokalemia- (present on admission) repleted. Recent Labs  Lab 11/02/21 0309 11/03/21 0554 11/04/21 0318 11/05/21 0351 11/06/21 0444  K 3.2* 3.3* 3.2* 3.3* 3.8    Mixed hyperlipidemia- (present on admission) Holding statin  Gastroesophageal reflux disease- (present on admission) Fluid within distal esophagus suggesting GERD as he reports heartburn.Cont PPI bid.  Sleep apnea usese CPAP at home. Here cont BiPAP  Essential hypertension- (present on admission) BP is controlled. Continue to hold losartan continue Cardizem.    DVT prophylaxis: SCDs Start: 10/28/21 0322 heaprin gtt Code Status:   Code Status: Full Code Family Communication: plan of care discussed with patient/ his wife and daughter at the bedside.   Disposition: Currently not medically stable for discharge. Status is: Inpatient, stepdown unit. Remains inpatient appropriate because: Ongoing management of acute pancreatitis.   Pancreatitis.waiting for telemetry bed.     Objective: Vitals last 24 hrs: Vitals:   11/06/21 0000 11/06/21 0450 11/06/21 0735 11/06/21 0800  BP: 118/74 128/72 (!) 157/72   Pulse: 80 80    Resp: (!) 22 (!) 24 (!) 27   Temp:  98.4 F (36.9 C)    TempSrc:  Oral    SpO2:  98%  99%  Weight:  130.2 kg    Height:       Weight change: 0.7 kg  Physical Examination: General exam: AA, older than stated age,  weak appearing. HEENT:Oral mucosa moist, Ear/Nose WNL grossly, dentition normal. Respiratory system: bilaterally basal crackles,no use of accessory muscle Cardiovascular system: S1 & S2 +, No JVD,. Gastrointestinal system: Abdomen soft, distended, NT,BS+ Nervous System:Alert, awake, moving extremities and grossly nonfocal Extremities: LE ankle edema  +, distal peripheral pulses palpable.  Skin: No rashes,no icterus. MSK: Normal muscle bulk,tone, power   Medications reviewed:  Scheduled Meds:  apixaban  5 mg Oral BID   brimonidine  1 drop Right Eye BID   Chlorhexidine Gluconate Cloth  6 each Topical Daily   diltiazem  240 mg Oral Daily   furosemide  40 mg Intravenous BID   pantoprazole (PROTONIX) IV  40 mg Intravenous Q12H   polyethylene glycol  17 g Oral BID   tamsulosin  0.8 mg Oral QPM   Continuous Infusions:  meropenem (MERREM) IV Stopped (11/06/21 5885)     Diet Order             Diet Heart Room service appropriate? Yes; Fluid consistency: Thin  Diet effective now                    Intake/Output Summary (Last 24 hours) at 11/06/2021 1125 Last data filed at 11/06/2021 1046 Gross per 24 hour  Intake 363.95 ml  Output 700 ml  Net -336.05 ml   Net IO Since Admission: 2,187.61 mL [  11/06/21 1125]  Wt Readings from Last 3 Encounters:  11/06/21 130.2 kg  05/15/21 113.4 kg  04/20/21 115.8 kg     Unresulted Labs (From admission, onward)    None     Data Reviewed: I have personally reviewed following labs and imaging studies CBC: Recent Labs  Lab 11/02/21 0309 11/03/21 0554 11/04/21 0318 11/05/21 0351 11/06/21 0444  WBC 13.5* 23.5* 26.3* 24.3* 19.2*  HGB 12.5* 13.5 12.8* 12.7* 12.7*  HCT 37.6* 40.7 38.2* 37.1* 37.4*  MCV 96.4 93.6 94.1 94.4 96.4  PLT 212 234 271 328 694   Basic Metabolic Panel: Recent Labs  Lab 11/02/21 0309 11/03/21 0554 11/04/21 0318 11/05/21 0351 11/06/21 0444  NA 134* 133* 133* 133* 133*  K 3.2* 3.3* 3.2* 3.3* 3.8  CL 105  101 103 100 102  CO2 20* 20* 21* 21* 20*  GLUCOSE 117* 150* 152* 145* 143*  BUN 15 12 10 10 12   CREATININE 0.85 0.75 0.70 0.67 0.69  CALCIUM 7.6* 7.9* 7.5* 7.6* 7.7*  MG  --   --  1.7  --   --    GFR: Estimated Creatinine Clearance: 111.6 mL/min (by C-G formula based on SCr of 0.69 mg/dL). Liver Function Tests: Recent Labs  Lab 11/02/21 0309 11/03/21 0554 11/04/21 0318 11/05/21 0351 11/06/21 0444  AST 23 23 24 30 28   ALT 22 22 20 24 22   ALKPHOS 54 66 63 64 64  BILITOT 1.4* 1.3* 0.9 0.8 0.9  PROT 4.9* 5.4* 4.9* 5.0* 4.9*  ALBUMIN 2.1* 2.1* 1.9* 1.9* 1.8*   Recent Labs  Lab 10/31/21 0400 11/05/21 0351  LIPASE 47 32   No results for input(s): AMMONIA in the last 168 hours.  Coagulation Profile: No results for input(s): INR, PROTIME in the last 168 hours. Cardiac Enzymes: No results for input(s): CKTOTAL, CKMB, CKMBINDEX, TROPONINI in the last 168 hours. Sepsis Labs: Recent Labs  Lab 10/31/21 0400 11/04/21 0318 11/05/21 0351 11/06/21 0444  PROCALCITON  --  1.62 1.20 0.96  LATICACIDVEN 1.6  --   --   --   Antimicrobials: Anti-infectives (From admission, onward)    Start     Dose/Rate Route Frequency Ordered Stop   11/01/21 1600  meropenem (MERREM) 1 g in sodium chloride 0.9 % 100 mL IVPB        1 g 200 mL/hr over 30 Minutes Intravenous Every 8 hours 11/01/21 1454     10/30/21 2100  Ampicillin-Sulbactam (UNASYN) 3 g in sodium chloride 0.9 % 100 mL IVPB  Status:  Discontinued        3 g 200 mL/hr over 30 Minutes Intravenous Every 6 hours 10/30/21 2024 11/01/21 1453      Culture/Microbiology No results found for: SDES, [place], [place], REPTSTATUS   Radiology Studies: No results found.   LOS: 9 days   Antonieta Pert, MD [facility]  11/06/2021, 11:25 AM

## 2021-11-06 NOTE — Assessment & Plan Note (Signed)
Patient appears bloated and has lower leg edema, baseline weight around 260s prior to admission currently 287.  We will try Lasix 40 mg q12, reassess again.

## 2021-11-06 NOTE — Assessment & Plan Note (Signed)
Suspect this is in the setting of abdominal process also having fluid overload we will try Lasix, Maalox Phenergan if persistent will try Thorazine.

## 2021-11-06 NOTE — Progress Notes (Signed)
Pt has home unit in room for use tonight RT supplied water and o2. Machine setup and ready for pt ti apply when ready

## 2021-11-07 DIAGNOSIS — R0902 Hypoxemia: Secondary | ICD-10-CM

## 2021-11-07 DIAGNOSIS — K8511 Biliary acute pancreatitis with uninfected necrosis: Secondary | ICD-10-CM

## 2021-11-07 DIAGNOSIS — E8770 Fluid overload, unspecified: Secondary | ICD-10-CM

## 2021-11-07 DIAGNOSIS — R066 Hiccough: Secondary | ICD-10-CM

## 2021-11-07 LAB — COMPREHENSIVE METABOLIC PANEL
ALT: 27 U/L (ref 0–44)
AST: 38 U/L (ref 15–41)
Albumin: 1.9 g/dL — ABNORMAL LOW (ref 3.5–5.0)
Alkaline Phosphatase: 63 U/L (ref 38–126)
Anion gap: 6 (ref 5–15)
BUN: 15 mg/dL (ref 8–23)
CO2: 23 mmol/L (ref 22–32)
Calcium: 7.7 mg/dL — ABNORMAL LOW (ref 8.9–10.3)
Chloride: 101 mmol/L (ref 98–111)
Creatinine, Ser: 0.68 mg/dL (ref 0.61–1.24)
GFR, Estimated: >60 mL/min (ref >60)
Glucose, Bld: 153 mg/dL — ABNORMAL HIGH (ref 70–99)
Potassium: 3.5 mmol/L (ref 3.5–5.1)
Sodium: 130 mmol/L — ABNORMAL LOW (ref 135–145)
Total Bilirubin: 0.7 mg/dL (ref 0.3–1.2)
Total Protein: 5 g/dL — ABNORMAL LOW (ref 6.5–8.1)

## 2021-11-07 LAB — PHOSPHORUS: Phosphorus: 3.8 mg/dL (ref 2.5–4.6)

## 2021-11-07 LAB — MAGNESIUM: Magnesium: 1.7 mg/dL (ref 1.7–2.4)

## 2021-11-07 LAB — LIPASE, BLOOD: Lipase: 39 U/L (ref 11–51)

## 2021-11-07 MED ORDER — SODIUM CHLORIDE 0.9 % IV SOLN
25.0000 mg | Freq: Once | INTRAVENOUS | Status: AC
  Administered 2021-11-07: 25 mg via INTRAVENOUS
  Filled 2021-11-07: qty 1

## 2021-11-07 MED ORDER — FUROSEMIDE 10 MG/ML IJ SOLN
60.0000 mg | Freq: Once | INTRAMUSCULAR | Status: AC
  Administered 2021-11-07: 60 mg via INTRAVENOUS
  Filled 2021-11-07: qty 6

## 2021-11-07 MED ORDER — SODIUM CHLORIDE 0.9 % IV SOLN
1.0000 g | Freq: Three times a day (TID) | INTRAVENOUS | Status: AC
  Administered 2021-11-07 (×2): 1 g via INTRAVENOUS
  Filled 2021-11-07 (×2): qty 20

## 2021-11-07 MED ORDER — ALBUMIN HUMAN 25 % IV SOLN
50.0000 g | Freq: Once | INTRAVENOUS | Status: AC
  Administered 2021-11-07: 50 g via INTRAVENOUS
  Filled 2021-11-07: qty 200

## 2021-11-07 NOTE — Progress Notes (Signed)
Patient has home unit in room.  Patient self manages machine.

## 2021-11-07 NOTE — Progress Notes (Signed)
PROGRESS NOTE    Sean Golden  JJK:[id number] DOB: December 01, 1945 DOA: 10/27/2021 PCP: Celene Squibb, MD     Brief Narrative:  Sean Golden, 76 y.o. WM PMHx A-fib on Eliquis, HTN, GERD/erosive esophagitis, H. Pylori and OSA   Presenting with acute onset cramping epigastric abdominal pain radiating to his right abdomen after he had a dinner, and admitted for acute pancreatitis.  Lipase elevated to 1680.  Mild LFT elevation with pattern consistent with EtOH or rhabdo but CK within normal.  Patient admits to drinking 2 to 3 glasses of wine before bedtime.  CTA C/A/P concerning for acute pancreatitis without necrosis or pseudocyst.  CT also showed fluid distention of the stomach and esophagus concerning for GOO or an GERD.  Started on IV fluid, IV analgesics and antiemetics, and admitted.  GI consulted.     RUQ [place] negative for gallstone or signs of acute cholecystitis but hepatic steatosis.  Pancreatitis improving clinically and chemically.  However, patient developed respiratory distress with hypoxia requiring supplemental oxygen likely from atelectasis and IV fluid.  IV fluid discontinued.  Started on IV Lasix. CT chest 2/7-New development of bilateral basilar consolidation or atelectasis in the lungs, New finding of peripancreatic gas suggesting necrotizing infection or fistula. Patient further completed CT abdomen.2/11-although clinically stable had bump in WBC count discussed with ID, IR and GI-for possible aspiration but no fluid to aspirate as per IR, continued on IV meropenem-with plan to complete 7 days course.GI signed off 2/12.   Subjective: Afebrile overnight although with elevated temp (37.8 C), A/O x4, complains of continued abdominal pain and refractory hiccups.  Has been started on solid food   Assessment & Plan: Covid vaccination;   Principal Problem:   Acute necrotizing pancreatitis Active Problems:   Essential hypertension   Paroxysmal atrial fibrillation (HCC)    Gastroesophageal reflux disease   Acute respiratory failure with hypoxia (HCC)   Mixed hyperlipidemia   Hypokalemia   Transaminitis with hyperbilirubinemia   Lactic acidosis   Polycythemia   Bandemia   Obesity with sleep apnea   AKI (acute kidney injury) ([place])   Metabolic acidosis   Acute pancreatitis   Epigastric pain   Hiccups   Fluid overload   Morbidly obese (HCC)  * Acute necrotizing pancreatitis- (present on admission) CT chest 2/7-new finding of peripancreatic edema suggestive of necrotizing infection or fistula-started on Unasyn 2/7-CT pancreas 2/9-signs of severe pancreatitis also some locules of gas adjacent to the duodenum-as well as other findings see the report, antibiotic changed to meropenem 2/9- and had CT of the abd w/ po contrast-no obvious perforation or source of gas from at this time. Now tolerating diet-low fat, clinically improving although hiccups bothering him.  Continue OOB, PTOT.Pain control. He had worsening leukocytosis and discussed with GI, ID and with Dr. Dwaine Gale from IR 2/11-no fluid to aspirate for culture. WBC now improving-will complete 7 days of antibiotics. -2/15 trend lipase - 2/15 restart patient on full liquid diet.     Bandemia- (present on admission) -Bandemia likely in the setting of pancreatitis.  Initial leukocytosis had resolved but started uptrending, now improving,complete antibiotics.See acute pancreatitis.procalcitonin improving    Acute respiratory failure with hypoxia (HCC) -Resolved. -2/18 Obtain ambulatory SPO2 place findings and appropriate epic note for insurance.  Notify physician when complete   Paroxysmal atrial fibrillation ([place])- (present on admission) -Rate controlled on Cardizem 240 daily(,at home additionally on 120 mg in evening).  -Eliquis 5 mg BID -Continue to hold losartan -2/15 currently NSR  Indeterminate CHF - Previous echocardiogram on 2/8 shows normal EF with moderate LVH, indeterminate diastolic parameters.   Patient in A-fib.  Essential HTN - See A-fib   Fluid overload Patient appears bloated and has lower leg edema, baseline weight around 260s prior to admission currently 287.   -Per patient and family baseline weight~265 pounds (120 kg) - Strict in and out +737.91ml - Daily weight Filed Weights   11/05/21 0500 11/06/21 0450 11/07/21 0622  Weight: 129.5 kg 130.2 kg 129.8 kg  -2/15 Albumin 50 g +Lasix IV 60 mg x 1 -   Refractory hiccups -Multifactorial acute pancreatitis and fluid overload. - Thorazine IV 25 mg x 1    Metabolic acidosis -Bicarb is stable,monitor -2/15 resolved   AKI (acute kidney injury) ([place] ) Lab Results  Component Value Date   CREATININE 0.68 11/07/2021   CREATININE 0.69 11/06/2021   CREATININE 0.67 11/05/2021   CREATININE 0.70 11/04/2021   CREATININE 0.75 11/03/2021  -Resolved   Obesity with sleep apnea -Will benefit with PCP follow-up, weight loss.   -Cont home CPAP   Polycythemia Likely from hemconcentration and sleep apnea related.   -Resolved    Lactic acidosis -In the setting of pancreatitis and dehydration. Resolved.    Transaminitis with hyperbilirubinemia- (present on admission) -Pattern consistent with alcohol or rhabdo but CK within normal.  Improved.   Hypokalemia- (present on admission) -Resolved .   Mixed hyperlipidemia- (present on admission) -Holding statin   Gastroesophageal reflux disease- (present on admission) -Fluid within distal esophagus suggesting GERD as he reports heartburn.Cont PPI bid.     Morbidly obese (BMI 41.0 kg/m)    DVT prophylaxis:  SCDs Start: 10/28/21 0322 heaprin gtt Code Status: Full Family Communication: 2/15 wife and daughter at bedside for discussion of plan of care all questions answered Status is: Inpatient    Dispo: The patient is from: Home              Anticipated d/c is to: Home              Anticipated d/c date is: 3 days              Patient currently is not medically stable to  d/c.      Consultants:    Procedures/Significant Events:    I have personally reviewed and interpreted all radiology studies and my findings are as above.  VENTILATOR SETTINGS:    Cultures   Antimicrobials:    Devices    LINES / TUBES:      Continuous Infusions:  meropenem (MERREM) IV 1 g (11/07/21 1544)     Objective: Vitals:   11/07/21 0452 11/07/21 0622 11/07/21 0814 11/07/21 1414  BP: 132/68  119/79 131/68  Pulse: 95   87  Resp: 20   19  Temp: 100.1 F (37.8 C)   (!) 97.3 F (36.3 C)  TempSrc: Oral   Oral  SpO2: 97%   91%  Weight:  129.8 kg    Height:        Intake/Output Summary (Last 24 hours) at 11/07/2021 1717 Last data filed at 11/07/2021 1600 Gross per 24 hour  Intake 240 ml  Output 3675 ml  Net -3435 ml   Filed Weights   11/05/21 0500 11/06/21 0450 11/07/21 0622  Weight: 129.5 kg 130.2 kg 129.8 kg    Examination:  General: A/O x4, No acute respiratory distress Eyes: negative scleral hemorrhage, negative anisocoria, negative icterus ENT: Negative Runny nose, negative gingival bleeding, Neck:  Negative scars, masses,  torticollis, lymphadenopathy, JVD Lungs: Clear to auscultation bilaterally without wheezes or crackles Cardiovascular: Regular rate and rhythm without murmur gallop or rub normal S1 and S2 Abdomen: MORBIDLY OBESE, negative abdominal pain, nondistended, positive soft, bowel sounds, no rebound, no ascites, no appreciable mass Extremities: No significant cyanosis, clubbing, or edema bilateral lower extremities Skin: Negative rashes, lesions, ulcers Psychiatric:  Negative depression, negative anxiety, negative fatigue, negative mania  Central nervous system:  Cranial nerves II through XII intact, tongue/uvula midline, all extremities muscle strength 5/5, sensation intact throughout, negative dysarthria, negative expressive aphasia, negative receptive aphasia.  .     Data Reviewed: Care during the described time  interval was provided by me .  I have reviewed this patient's available data, including medical history, events of note, physical examination, and all test results as part of my evaluation.  CBC: Recent Labs  Lab 11/02/21 0309 11/03/21 0554 11/04/21 0318 11/05/21 0351 11/06/21 0444  WBC 13.5* 23.5* 26.3* 24.3* 19.2*  HGB 12.5* 13.5 12.8* 12.7* 12.7*  HCT 37.6* 40.7 38.2* 37.1* 37.4*  MCV 96.4 93.6 94.1 94.4 96.4  PLT 212 234 271 328 947   Basic Metabolic Panel: Recent Labs  Lab 11/03/21 0554 11/04/21 0318 11/05/21 0351 11/06/21 0444 11/07/21 0729  NA 133* 133* 133* 133* 130*  K 3.3* 3.2* 3.3* 3.8 3.5  CL 101 103 100 102 101  CO2 20* 21* 21* 20* 23  GLUCOSE 150* 152* 145* 143* 153*  BUN 12 10 10 12 15   CREATININE 0.75 0.70 0.67 0.69 0.68  CALCIUM 7.9* 7.5* 7.6* 7.7* 7.7*  MG  --  1.7  --   --  1.7  PHOS  --   --   --   --  3.8   GFR: Estimated Creatinine Clearance: 111.3 mL/min (by C-G formula based on SCr of 0.68 mg/dL). Liver Function Tests: Recent Labs  Lab 11/03/21 0554 11/04/21 0318 11/05/21 0351 11/06/21 0444 11/07/21 0729  AST 23 24 30 28  38  ALT 22 20 24 22 27   ALKPHOS 66 63 64 64 63  BILITOT 1.3* 0.9 0.8 0.9 0.7  PROT 5.4* 4.9* 5.0* 4.9* 5.0*  ALBUMIN 2.1* 1.9* 1.9* 1.8* 1.9*   Recent Labs  Lab 11/05/21 0351 11/07/21 0729  LIPASE 32 39   No results for input(s): AMMONIA in the last 168 hours. Coagulation Profile: No results for input(s): INR, PROTIME in the last 168 hours. Cardiac Enzymes: No results for input(s): CKTOTAL, CKMB, CKMBINDEX, TROPONINI in the last 168 hours. BNP (last 3 results) No results for input(s): PROBNP in the last 8760 hours. HbA1C: No results for input(s): HGBA1C in the last 72 hours. CBG: No results for input(s): GLUCAP in the last 168 hours. Lipid Profile: No results for input(s): CHOL, HDL, LDLCALC, TRIG, CHOLHDL, LDLDIRECT in the last 72 hours. Thyroid Function Tests: No results for input(s): TSH, T4TOTAL,  FREET4, T3FREE, THYROIDAB in the last 72 hours. Anemia Panel: No results for input(s): VITAMINB12, FOLATE, FERRITIN, TIBC, IRON, RETICCTPCT in the last 72 hours. Sepsis Labs: Recent Labs  Lab 11/04/21 0318 11/05/21 0351 11/06/21 0444  PROCALCITON 1.62 1.20 0.96    Recent Results (from the past 240 hour(s))  MRSA Next Gen by PCR, Nasal     Status: None   Collection Time: 10/30/21  5:46 PM   Specimen: Nasal Mucosa; Nasal Swab  Result Value Ref Range Status   MRSA by PCR Next Gen NOT DETECTED NOT DETECTED Final    Comment: (NOTE) The GeneXpert MRSA Assay (FDA approved for  NASAL specimens only), is one component of a comprehensive MRSA colonization surveillance program. It is not intended to diagnose MRSA infection nor to guide or monitor treatment for MRSA infections. Test performance is not FDA approved in patients less than 84 years old. Performed at [facility], [address] [place], [place] [address]          Radiology Studies: No results found.      Scheduled Meds:  apixaban  5 mg Oral BID   brimonidine  1 drop Right Eye BID   Chlorhexidine Gluconate Cloth  6 each Topical Daily   diltiazem  240 mg Oral Daily   pantoprazole (PROTONIX) IV  40 mg Intravenous Q12H   polyethylene glycol  17 g Oral BID   tamsulosin  0.8 mg Oral QPM   Continuous Infusions:  meropenem (MERREM) IV 1 g (11/07/21 1544)     LOS: 10 days    Time spent:40 min    , Geraldo Docker, MD [facility]   If 7PM-7AM, please contact night-coverage 11/07/2021, 5:17 PM

## 2021-11-07 NOTE — Progress Notes (Signed)
SATURATION QUALIFICATIONS: (This note is used to comply with regulatory documentation for home oxygen)  Patient Saturations on Room Air at Rest = 91 %  Patient Saturations on Room Air while Ambulating = 86 %  Patient Saturations on 2 Liters of oxygen while Ambulating = 94%  Please briefly explain why patient needs home oxygen: Pt was ambulated without oxygen and o2 dropped to 86%

## 2021-11-08 DIAGNOSIS — D72828 Other elevated white blood cell count: Secondary | ICD-10-CM

## 2021-11-08 DIAGNOSIS — D72829 Elevated white blood cell count, unspecified: Secondary | ICD-10-CM | POA: Diagnosis present

## 2021-11-08 LAB — CBC WITH DIFFERENTIAL/PLATELET
Abs Immature Granulocytes: 0.41 10*3/uL — ABNORMAL HIGH (ref 0.00–0.07)
Basophils Absolute: 0.1 10*3/uL (ref 0.0–0.1)
Basophils Relative: 1 %
Eosinophils Absolute: 0 10*3/uL (ref 0.0–0.5)
Eosinophils Relative: 0 %
HCT: 35.6 % — ABNORMAL LOW (ref 39.0–52.0)
Hemoglobin: 11.8 g/dL — ABNORMAL LOW (ref 13.0–17.0)
Immature Granulocytes: 3 %
Lymphocytes Relative: 8 %
Lymphs Abs: 1.3 10*3/uL (ref 0.7–4.0)
MCH: 32.2 pg (ref 26.0–34.0)
MCHC: 33.1 g/dL (ref 30.0–36.0)
MCV: 97 fL (ref 80.0–100.0)
Monocytes Absolute: 1.8 10*3/uL — ABNORMAL HIGH (ref 0.1–1.0)
Monocytes Relative: 11 %
Neutro Abs: 13.1 10*3/uL — ABNORMAL HIGH (ref 1.7–7.7)
Neutrophils Relative %: 77 %
Platelets: 506 10*3/uL — ABNORMAL HIGH (ref 150–400)
RBC: 3.67 MIL/uL — ABNORMAL LOW (ref 4.22–5.81)
RDW: 13.6 % (ref 11.5–15.5)
WBC: 16.7 10*3/uL — ABNORMAL HIGH (ref 4.0–10.5)
nRBC: 0 % (ref 0.0–0.2)

## 2021-11-08 LAB — COMPREHENSIVE METABOLIC PANEL
ALT: 24 U/L (ref 0–44)
AST: 35 U/L (ref 15–41)
Albumin: 2.3 g/dL — ABNORMAL LOW (ref 3.5–5.0)
Alkaline Phosphatase: 55 U/L (ref 38–126)
Anion gap: 14 (ref 5–15)
BUN: 13 mg/dL (ref 8–23)
CO2: 21 mmol/L — ABNORMAL LOW (ref 22–32)
Calcium: 7.8 mg/dL — ABNORMAL LOW (ref 8.9–10.3)
Chloride: 97 mmol/L — ABNORMAL LOW (ref 98–111)
Creatinine, Ser: 0.69 mg/dL (ref 0.61–1.24)
GFR, Estimated: >60 mL/min (ref >60)
Glucose, Bld: 146 mg/dL — ABNORMAL HIGH (ref 70–99)
Potassium: 3.2 mmol/L — ABNORMAL LOW (ref 3.5–5.1)
Sodium: 132 mmol/L — ABNORMAL LOW (ref 135–145)
Total Bilirubin: 0.9 mg/dL (ref 0.3–1.2)
Total Protein: 4.9 g/dL — ABNORMAL LOW (ref 6.5–8.1)

## 2021-11-08 LAB — MAGNESIUM: Magnesium: 1.7 mg/dL (ref 1.7–2.4)

## 2021-11-08 LAB — LIPASE, BLOOD: Lipase: 42 U/L (ref 11–51)

## 2021-11-08 LAB — PHOSPHORUS: Phosphorus: 3.2 mg/dL (ref 2.5–4.6)

## 2021-11-08 MED ORDER — ALBUMIN HUMAN 25 % IV SOLN
25.0000 g | Freq: Once | INTRAVENOUS | Status: AC
  Administered 2021-11-08: 25 g via INTRAVENOUS
  Filled 2021-11-08: qty 100

## 2021-11-08 MED ORDER — TRAMADOL HCL 50 MG PO TABS
50.0000 mg | ORAL_TABLET | Freq: Four times a day (QID) | ORAL | Status: DC | PRN
  Administered 2021-11-08 (×2): 50 mg via ORAL
  Filled 2021-11-08 (×2): qty 1

## 2021-11-08 MED ORDER — POTASSIUM CHLORIDE CRYS ER 20 MEQ PO TBCR
40.0000 meq | EXTENDED_RELEASE_TABLET | Freq: Two times a day (BID) | ORAL | Status: AC
  Administered 2021-11-08 (×2): 40 meq via ORAL
  Filled 2021-11-08 (×2): qty 2

## 2021-11-08 MED ORDER — MAGNESIUM SULFATE 2 GM/50ML IV SOLN
2.0000 g | Freq: Once | INTRAVENOUS | Status: AC
  Administered 2021-11-08: 2 g via INTRAVENOUS
  Filled 2021-11-08: qty 50

## 2021-11-08 MED ORDER — FUROSEMIDE 10 MG/ML IJ SOLN
60.0000 mg | Freq: Once | INTRAMUSCULAR | Status: AC
  Administered 2021-11-08: 60 mg via INTRAVENOUS
  Filled 2021-11-08: qty 6

## 2021-11-08 NOTE — Progress Notes (Signed)
PROGRESS NOTE    [name]  NFA:[id number] DOB: 07/08/1946 DOA: 10/27/2021 PCP: Celene Squibb, MD     Brief Narrative:  Sean Golden, 76 y.o. WM PMHx A-fib on Eliquis, HTN, GERD/erosive esophagitis, H. Pylori and OSA   Presenting with acute onset cramping epigastric abdominal pain radiating to his right abdomen after he had a dinner, and admitted for acute pancreatitis.  Lipase elevated to 1680.  Mild LFT elevation with pattern consistent with EtOH or rhabdo but CK within normal.  Patient admits to drinking 2 to 3 glasses of wine before bedtime.  CTA C/A/P concerning for acute pancreatitis without necrosis or pseudocyst.  CT also showed fluid distention of the stomach and esophagus concerning for GOO or an GERD.  Started on IV fluid, IV analgesics and antiemetics, and admitted.  GI consulted.     RUQ [place] negative for gallstone or signs of acute cholecystitis but hepatic steatosis.  Pancreatitis improving clinically and chemically.  However, patient developed respiratory distress with hypoxia requiring supplemental oxygen likely from atelectasis and IV fluid.  IV fluid discontinued.  Started on IV Lasix. CT chest 2/7-New development of bilateral basilar consolidation or atelectasis in the lungs, New finding of peripancreatic gas suggesting necrotizing infection or fistula. Patient further completed CT abdomen.2/11-although clinically stable had bump in WBC count discussed with ID, IR and GI-for possible aspiration but no fluid to aspirate as per IR, continued on IV meropenem-with plan to complete 7 days course.GI signed off 2/12.   Subjective: 2/16 febrile overnight A/O x4, states he still has some slight abdominal pain.  Would like to try tramadol.  Would like to continue 1 more day of current diet of full liquids before we try to advance to a bland diet.   Assessment & Plan: Covid vaccination;   Principal Problem:   Acute necrotizing pancreatitis Active Problems:   Essential  hypertension   Paroxysmal atrial fibrillation (HCC)   Gastroesophageal reflux disease   Acute respiratory failure with hypoxia (HCC)   Mixed hyperlipidemia   Hypokalemia   Transaminitis with hyperbilirubinemia   Lactic acidosis   Polycythemia   Bandemia   Obesity with sleep apnea   AKI (acute kidney injury) ([place])   Metabolic acidosis   Acute pancreatitis   Epigastric pain   Hiccups   Fluid overload   Morbidly obese (HCC)   Leukocytosis  Acute necrotizing pancreatitis- (present on admission) CT chest 2/7-new finding of peripancreatic edema suggestive of necrotizing infection or fistula-started on Unasyn 2/7-CT pancreas 2/9-signs of severe pancreatitis also some locules of gas adjacent to the duodenum-as well as other findings see the report, antibiotic changed to meropenem 2/9- and had CT of the abd w/ po contrast-no obvious perforation or source of gas from at this time. Now tolerating diet-low fat, clinically improving although hiccups bothering him.  Continue OOB, PTOT.Pain control. He had worsening leukocytosis and discussed with GI, ID and with Dr. Dwaine Gale from IR 2/11-no fluid to aspirate for culture. WBC now improving-will complete 7 days of antibiotics. -2/15 trend lipase Lab Results  Component Value Date   LIPASE 42 11/08/2021   LIPASE 39 11/07/2021   LIPASE 32 11/05/2021   LIPASE 47 10/31/2021   LIPASE 144 (H) 10/30/2021  -2/16 WNL with full liquid diet - 2/15 restart patient on full liquid diet. -2/16 we will continue full liquid diet for 1 more day at patient's request   Bandemia- (present on admission) -Bandemia likely in the setting of pancreatitis.  Initial leukocytosis had resolved but  started uptrending, now improving,complete antibiotics.See acute pancreatitis.procalcitonin improving  Leukocytosis - 2/16 continues to improve    Acute respiratory failure with hypoxia (HCC) -Resolved. -2/15 Obtain ambulatory SPO2: Patient meets criteria for home O2 SATURATION  QUALIFICATIONS: (This note is used to comply with regulatory documentation for home oxygen) Patient Saturations on Room Air at Rest = 91%  Patient Saturations on Room Air while Ambulating = 86%  Patient Saturations on 2 Liters of oxygen while Ambulating = 94%  Please briefly explain why patient needs home oxygen: Pt was ambulated without oxygen and o2 dropped to 86% -2 L O2 via [place] titrate to maintain SPO2> 92% - Obtain [employer] portable home O2 generator   Paroxysmal atrial fibrillation ([place])- (present on admission) -Rate controlled on Cardizem 240 daily(,at home additionally on 120 mg in evening).  -Eliquis 5 mg BID -Continue to hold losartan -2/15 currently NSR  Indeterminate CHF - Previous echocardiogram on 2/8 shows normal EF with moderate LVH, indeterminate diastolic parameters.  Patient in A-fib.  Essential HTN - See A-fib   Fluid overload Patient appears bloated and has lower leg edema, baseline weight around 260s prior to admission currently 287.   -Per patient and family baseline weight~265 pounds (120 kg) - Strict in and out -1.75 L - Daily weight Filed Weights   11/07/21 0622 11/08/21 0500 11/08/21 0615  Weight: 129.8 kg 127.3 kg 127.2 kg  -2/15 Albumin 50 g +Lasix IV 60 mg x 1 - 2/16 albumin 25 g+ Lasix IV 60 mg x 1   Refractory hiccups -Multifactorial acute pancreatitis and fluid overload. - Thorazine IV 25 mg x 1  -2/16 resolved   Metabolic acidosis -Bicarb is stable,monitor -2/15 resolved   AKI (acute kidney injury) [facility]) Lab Results  Component Value Date   CREATININE 0.69 11/08/2021   CREATININE 0.68 11/07/2021   CREATININE 0.69 11/06/2021   CREATININE 0.67 11/05/2021   CREATININE 0.70 11/04/2021  -Resolved   Obesity with sleep apnea -Will benefit with PCP follow-up, weight loss.   -Cont home CPAP   Polycythemia -Likely from hemconcentration and sleep apnea related.   -Resolved    Lactic acidosis -In the setting of pancreatitis and dehydration.  Resolved.    Transaminitis with hyperbilirubinemia- (present on admission) -Pattern consistent with alcohol or rhabdo but CK within normal.  Improved.   Hypokalemia- (present on admission) - 2/16 potassium goal> 4 - 2/16 potassium p.o 40 mEq.  X2 doses  Hypomagnesmia - 2/16 magnesium goal> 2 - 2/16 magnesium IV 2 g   Mixed hyperlipidemia- (present on admission) -Holding statin   Gastroesophageal reflux disease- (present on admission) -Fluid within distal esophagus suggesting GERD as he reports heartburn.Cont PPI bid.  Morbidly obese (BMI 41.0 kg/m)    DVT prophylaxis:  SCDs Start: 10/28/21 0322 heaprin gtt Code Status: Full Family Communication: 2/16 wife at bedside for discussion of plan of care all questions answered Status is: Inpatient    Dispo: The patient is from: Home              Anticipated d/c is to: Home              Anticipated d/c date is: 3 days              Patient currently is not medically stable to d/c.      Consultants:    Procedures/Significant Events:    I have personally reviewed and interpreted all radiology studies and my findings are as above.  VENTILATOR SETTINGS: CPAP 2/16 Flow 3 L/min  SPO2 94%   Cultures   Antimicrobials:    Devices    LINES / TUBES:      Continuous Infusions:     Objective: Vitals:   11/07/21 2113 11/08/21 0428 11/08/21 0500 11/08/21 0615  BP: (!) 144/73 121/67    Pulse: 96 94    Resp: 19 19    Temp: 98.9 F (37.2 C) 98.1 F (36.7 C)    TempSrc:  Oral    SpO2: 93% 94%    Weight:   127.3 kg 127.2 kg  Height:        Intake/Output Summary (Last 24 hours) at 11/08/2021 1434 Last data filed at 11/08/2021 1427 Gross per 24 hour  Intake 2060 ml  Output 5225 ml  Net -3165 ml   Filed Weights   11/07/21 0622 11/08/21 0500 11/08/21 0615  Weight: 129.8 kg 127.3 kg 127.2 kg    Examination:  General: A/O x4, No acute respiratory distress Eyes: negative scleral hemorrhage, negative  anisocoria, negative icterus ENT: Negative Runny nose, negative gingival bleeding, Neck:  Negative scars, masses, torticollis, lymphadenopathy, JVD Lungs: Clear to auscultation bilaterally without wheezes or crackles Cardiovascular: Regular rate and rhythm without murmur gallop or rub normal S1 and S2 Abdomen: MORBIDLY OBESE, negative abdominal pain, nondistended, positive soft, bowel sounds, no rebound, no ascites, no appreciable mass Extremities: No significant cyanosis, clubbing, or edema bilateral lower extremities Skin: Negative rashes, lesions, ulcers Psychiatric:  Negative depression, negative anxiety, negative fatigue, negative mania  Central nervous system:  Cranial nerves II through XII intact, tongue/uvula midline, all extremities muscle strength 5/5, sensation intact throughout, negative dysarthria, negative expressive aphasia, negative receptive aphasia.  .     Data Reviewed: Care during the described time interval was provided by me .  I have reviewed this patient's available data, including medical history, events of note, physical examination, and all test results as part of my evaluation.  CBC: Recent Labs  Lab 11/03/21 0554 11/04/21 0318 11/05/21 0351 11/06/21 0444 11/08/21 0423  WBC 23.5* 26.3* 24.3* 19.2* 16.7*  NEUTROABS  --   --   --   --  13.1*  HGB 13.5 12.8* 12.7* 12.7* 11.8*  HCT 40.7 38.2* 37.1* 37.4* 35.6*  MCV 93.6 94.1 94.4 96.4 97.0  PLT 234 271 328 374 470*   Basic Metabolic Panel: Recent Labs  Lab 11/04/21 0318 11/05/21 0351 11/06/21 0444 11/07/21 0729 11/08/21 0423  NA 133* 133* 133* 130* 132*  K 3.2* 3.3* 3.8 3.5 3.2*  CL 103 100 102 101 97*  CO2 21* 21* 20* 23 21*  GLUCOSE 152* 145* 143* 153* 146*  BUN 10 10 12 15 13   CREATININE 0.70 0.67 0.69 0.68 0.69  CALCIUM 7.5* 7.6* 7.7* 7.7* 7.8*  MG 1.7  --   --  1.7 1.7  PHOS  --   --   --  3.8 3.2   GFR: Estimated Creatinine Clearance: 110.2 mL/min (by C-G formula based on SCr of 0.69  mg/dL). Liver Function Tests: Recent Labs  Lab 11/04/21 0318 11/05/21 0351 11/06/21 0444 11/07/21 0729 11/08/21 0423  AST 24 30 28  38 35  ALT 20 24 22 27 24   ALKPHOS 63 64 64 63 55  BILITOT 0.9 0.8 0.9 0.7 0.9  PROT 4.9* 5.0* 4.9* 5.0* 4.9*  ALBUMIN 1.9* 1.9* 1.8* 1.9* 2.3*   Recent Labs  Lab 11/05/21 0351 11/07/21 0729 11/08/21 0423  LIPASE 32 39 42   No results for input(s): AMMONIA in the last 168 hours. Coagulation Profile: No  results for input(s): INR, PROTIME in the last 168 hours. Cardiac Enzymes: No results for input(s): CKTOTAL, CKMB, CKMBINDEX, TROPONINI in the last 168 hours. BNP (last 3 results) No results for input(s): PROBNP in the last 8760 hours. HbA1C: No results for input(s): HGBA1C in the last 72 hours. CBG: No results for input(s): GLUCAP in the last 168 hours. Lipid Profile: No results for input(s): CHOL, HDL, LDLCALC, TRIG, CHOLHDL, LDLDIRECT in the last 72 hours. Thyroid Function Tests: No results for input(s): TSH, T4TOTAL, FREET4, T3FREE, THYROIDAB in the last 72 hours. Anemia Panel: No results for input(s): VITAMINB12, FOLATE, FERRITIN, TIBC, IRON, RETICCTPCT in the last 72 hours. Sepsis Labs: Recent Labs  Lab 11/04/21 0318 11/05/21 0351 11/06/21 0444  PROCALCITON 1.62 1.20 0.96    Recent Results (from the past 240 hour(s))  MRSA Next Gen by PCR, Nasal     Status: None   Collection Time: 10/30/21  5:46 PM   Specimen: Nasal Mucosa; Nasal Swab  Result Value Ref Range Status   MRSA by PCR Next Gen NOT DETECTED NOT DETECTED Final    Comment: (NOTE) The GeneXpert MRSA Assay (FDA approved for NASAL specimens only), is one component of a comprehensive MRSA colonization surveillance program. It is not intended to diagnose MRSA infection nor to guide or monitor treatment for MRSA infections. Test performance is not FDA approved in patients less than 43 years old. Performed at [facility], [address], [place], [place] [address]           Radiology Studies: No results found.      Scheduled Meds:  apixaban  5 mg Oral BID   brimonidine  1 drop Right Eye BID   Chlorhexidine Gluconate Cloth  6 each Topical Daily   diltiazem  240 mg Oral Daily   pantoprazole (PROTONIX) IV  40 mg Intravenous Q12H   polyethylene glycol  17 g Oral BID   potassium chloride  40 mEq Oral BID   tamsulosin  0.8 mg Oral QPM   Continuous Infusions:     LOS: 11 days    Time spent:40 min    , Geraldo Docker, MD [facility]   If 7PM-7AM, please contact night-coverage 11/08/2021, 2:34 PM

## 2021-11-08 NOTE — Progress Notes (Signed)
Standing weight obtained. Weight 127.189 kg

## 2021-11-08 NOTE — Progress Notes (Signed)
Attempted to obtain weight on standing scale, however, unable to do so due to low battery on scale. Unable to change at this time. Weight obtained on bed. Weight 127.3 kg on bed.

## 2021-11-08 NOTE — Progress Notes (Signed)
Patient has an area of warmth and redness to left AC where IV was established. There is some induration noted. MD notified. We will continue to monitor, watch WBC and for any fever. Patient does not complain of any pain.

## 2021-11-09 LAB — CBC WITH DIFFERENTIAL/PLATELET
Abs Immature Granulocytes: 0.33 10*3/uL — ABNORMAL HIGH (ref 0.00–0.07)
Basophils Absolute: 0.1 10*3/uL (ref 0.0–0.1)
Basophils Relative: 0 %
Eosinophils Absolute: 0 10*3/uL (ref 0.0–0.5)
Eosinophils Relative: 0 %
HCT: 37.1 % — ABNORMAL LOW (ref 39.0–52.0)
Hemoglobin: 11.9 g/dL — ABNORMAL LOW (ref 13.0–17.0)
Immature Granulocytes: 2 %
Lymphocytes Relative: 10 %
Lymphs Abs: 1.5 10*3/uL (ref 0.7–4.0)
MCH: 30.7 pg (ref 26.0–34.0)
MCHC: 32.1 g/dL (ref 30.0–36.0)
MCV: 95.9 fL (ref 80.0–100.0)
Monocytes Absolute: 1.6 10*3/uL — ABNORMAL HIGH (ref 0.1–1.0)
Monocytes Relative: 10 %
Neutro Abs: 12 10*3/uL — ABNORMAL HIGH (ref 1.7–7.7)
Neutrophils Relative %: 78 %
Platelets: 549 10*3/uL — ABNORMAL HIGH (ref 150–400)
RBC: 3.87 MIL/uL — ABNORMAL LOW (ref 4.22–5.81)
RDW: 13.4 % (ref 11.5–15.5)
WBC: 15.4 10*3/uL — ABNORMAL HIGH (ref 4.0–10.5)
nRBC: 0 % (ref 0.0–0.2)

## 2021-11-09 LAB — COMPREHENSIVE METABOLIC PANEL
ALT: 30 U/L (ref 0–44)
AST: 47 U/L — ABNORMAL HIGH (ref 15–41)
Albumin: 2.4 g/dL — ABNORMAL LOW (ref 3.5–5.0)
Alkaline Phosphatase: 62 U/L (ref 38–126)
Anion gap: 11 (ref 5–15)
BUN: 13 mg/dL (ref 8–23)
CO2: 23 mmol/L (ref 22–32)
Calcium: 8 mg/dL — ABNORMAL LOW (ref 8.9–10.3)
Chloride: 99 mmol/L (ref 98–111)
Creatinine, Ser: 0.68 mg/dL (ref 0.61–1.24)
GFR, Estimated: >60 mL/min (ref >60)
Glucose, Bld: 146 mg/dL — ABNORMAL HIGH (ref 70–99)
Potassium: 3.6 mmol/L (ref 3.5–5.1)
Sodium: 133 mmol/L — ABNORMAL LOW (ref 135–145)
Total Bilirubin: 0.7 mg/dL (ref 0.3–1.2)
Total Protein: 5.4 g/dL — ABNORMAL LOW (ref 6.5–8.1)

## 2021-11-09 LAB — LIPASE, BLOOD: Lipase: 49 U/L (ref 11–51)

## 2021-11-09 LAB — MAGNESIUM: Magnesium: 2 mg/dL (ref 1.7–2.4)

## 2021-11-09 LAB — PHOSPHORUS: Phosphorus: 3.3 mg/dL (ref 2.5–4.6)

## 2021-11-09 MED ORDER — ALBUMIN HUMAN 25 % IV SOLN
50.0000 g | Freq: Once | INTRAVENOUS | Status: AC
  Administered 2021-11-09: 50 g via INTRAVENOUS
  Filled 2021-11-09: qty 200

## 2021-11-09 MED ORDER — SODIUM CHLORIDE 0.9 % IV SOLN
25.0000 mg | Freq: Once | INTRAVENOUS | Status: AC
  Administered 2021-11-09: 25 mg via INTRAVENOUS
  Filled 2021-11-09: qty 1

## 2021-11-09 MED ORDER — FUROSEMIDE 10 MG/ML IJ SOLN
60.0000 mg | Freq: Two times a day (BID) | INTRAMUSCULAR | Status: AC
  Administered 2021-11-09 – 2021-11-10 (×2): 60 mg via INTRAVENOUS
  Filled 2021-11-09 (×2): qty 6

## 2021-11-09 MED ORDER — POTASSIUM CHLORIDE CRYS ER 20 MEQ PO TBCR
40.0000 meq | EXTENDED_RELEASE_TABLET | Freq: Two times a day (BID) | ORAL | Status: AC
  Administered 2021-11-09 (×2): 40 meq via ORAL
  Filled 2021-11-09 (×2): qty 2

## 2021-11-09 NOTE — Progress Notes (Signed)
Initial Nutrition Assessment  DOCUMENTATION CODES:   Obesity unspecified  INTERVENTION:  - Encourage adequate PO intake - Continue Vegan Protein shakes from home - Ensure Enlive po once daily, each supplement provides 350 kcal and 20 grams of protein. - Discussed and added "Pancreatitis Nutrition Therapy" handout to AVS  NUTRITION DIAGNOSIS:   Increased nutrient needs related to acute illness (acute necrotizing pancreatitis) as evidenced by estimated needs.  GOAL:   Patient will meet greater than or equal to 90% of their needs  MONITOR:   PO intake, Diet advancement, Supplement acceptance, Labs, Weight trends, I & O's  REASON FOR ASSESSMENT:   Other (Comment) (Poor PO, FLD)    ASSESSMENT:   Pt admitted from home with abdominal pain secondary to acute necrotizing pancreatitis. PMH includes afib, HTN, GERD/erosive gastritis, H. Pylori, sleep apnea.  Discussed in IDT rounds. Needs -5 kg more of diuresis.   RUQ [place] negative for gallstone or signs of acute cholecystitis but hepatic steatosis. CT chest 2/7-new finding of peripancreatic edema suggestive of necrotizing infection or fistula CT pancreas 2/9-signs of severe pancreatitis also some locules of gas adjacent to the duodenum 2/15: pt experiencing hiccups and abdominal pain; restarted on full liquid diet  Pt states that PTA he had a great appetite and had 1 bout of pain 1 week PTA after eating and then day of admission ate a meal and within 15 minutes had severe abdominal pain. During admission he endorses a poor app, poor PO intake, and early satiety. Noted pt with volume overload which could be contributing to early satiety. He as continued with a full liquid diet to ensure tolerance to foods and wants to try a soft diet today.  He and his wife state provider recommended pea protein, so his family member brought Vegan Protein powder from [employer]. We discussed low fat diet to help aid in abdominal pain from pancreatitis.  Suggested he can continue to drink the Vegan Protein shakes, in addition to recommending Ensure during hospital admission as this has more protein and calories and is a low fat supplement. He is agreeable to this. He asked that I provide "Pancreatitis Nutrition Therapy" handout in his AVS for guidance on a home diet.   Pt endorses a usual weight of ~260 lbs. His wife reports his admit weight was 251 lbs and d/t increase in fluid retention, he was 286 lbs. Pt's weight today noted to be 276 lbs. However, he denies any recent weight loss.  Medications: protonix, miralax, KLOR-CON  Labs: sodium 133, AST 34  UOP: 3975 ml x24 hours I/O's: -3.9 L since admission  NUTRITION - FOCUSED PHYSICAL EXAM:  Flowsheet Row Most Recent Value  Orbital Region No depletion  Upper Arm Region No depletion  Thoracic and Lumbar Region No depletion  Buccal Region No depletion  Temple Region No depletion  Clavicle Bone Region No depletion  Clavicle and Acromion Bone Region No depletion  Scapular Bone Region No depletion  Dorsal Hand No depletion  Patellar Region No depletion  Anterior Thigh Region No depletion  Posterior Calf Region No depletion  Edema (RD Assessment) Moderate  [pitting BLE]  Hair Reviewed  Eyes Reviewed  Mouth Reviewed  Skin Reviewed  Nails Reviewed       Diet Order:   Diet Order             Diet full liquid Room service appropriate? Yes; Fluid consistency: Thin  Diet effective now  EDUCATION NEEDS:   Education needs have been addressed  Skin:  Skin Assessment: Reviewed RN Assessment  Last BM:  2/16  Height:   Ht Readings from Last 1 Encounters:  10/30/21 5\' 10"  (1.778 m)    Weight:   Wt Readings from Last 1 Encounters:  11/09/21 125.3 kg    Ideal Body Weight:  75.5 kg  BMI:  Body mass index is 39.64 kg/m.  Estimated Nutritional Needs:   Kcal:  1950-2150  Protein:  100-115g  Fluid:  >/=2L  Sean Golden, RDN, LDN Clinical  Nutrition

## 2021-11-09 NOTE — Discharge Instructions (Addendum)
Avoid all alcohol consumption   Pancreatitis Nutrition Therapy (2022)  The pancreas helps your body digest and absorb nutrients in food. Pancreatitis prevents the body from digesting food well, especially if the food is high in fat.  This nutrition therapy limits the fat in your diet while providing nutrients you need. Your goal is to eat as near to a normal diet as possible without experiencing gastrointestinal (GI) symptoms. These symptoms include stomach pain, bloating, weight loss or difficulty maintaining weight, vomiting, burping, loose stools, and steatorrhea. The effects of pancreatitis are different for all individuals, so it is important to work with your registered dietitian nutritionist (RDN) to determine which foods trigger your GI symptoms. Your RDN can also help you figure out your tolerance to fat in foods and how to manage your symptoms with your diet.  Tips You may need to take pancreatic enzymes if you have frequent, loose stools after mealtimes. Individuals who take pancreatic enzymes will need to take the prescribed dosage at the start of a meal or snack. Individuals who are prescribed a low-fat diet will need to limit fats and oils to no more than 6 teaspoons (30 grams) daily. Up to 1 ounce of avocado can also be substituted for 1 teaspoon of fat. A low-fat diet may not be needed if you are taking pancreatic enzymes. Avoid drinking alcohol. Keep a bottle of water with you at all times to stay hydrated and ensure you are getting in enough fluid each day. The general recommendation is to drink 8 cups (64 ounces) of fluids daily. Eat small, frequent meals (4-6) throughout the day to help you recover from pancreatitis or to maintain your normal body weight. Eat more whole fruits and vegetables rather than drinking fruit and vegetable juices.  Fiber is found in whole grain foods and slows digestion. You may need to choose whole grain foods less often if you feel full quickly after  eating. Ask your RDN for recommendations on managing your diet if you also have other conditions, such as diabetes mellitus. Your RDN might recommend a vitamin and mineral supplement or fat-soluble vitamin supplements if you require higher amounts of these nutrients.  Foods Recommended and Foods Not Recommended The following list of foods may be helpful if you need to limit your fat intake. Food Group Foods Recommended Foods Not Recommended  Grains Breads: Bagels, buns, English muffins Hot/cold cereals Couscous Low-fat crackers Pancakes Pasta Popcorn, air popped Rice Corn or flour tortilla Products made with added fat (such as biscuits, waffles, and regular crackers) High-fat bakery products such as doughnuts, biscuits, croissants, Danish pastries, pies, cookies Snacks made with partially hydrogenated oils including chips, cheese puffs, snack mixes, regular crackers, butter-flavored popcorn  Protein Foods Lean cuts of poultry (without skin) such as chicken or [place] Low-fat hamburger (for example, 7% fat) Lean cuts of fish (white fish) Canned tuna in water Egg whites or egg substitute Lean deli meats such as [place], chicken, lean beef Non-animal protein sources (tofu, legumes, beans, lentils) Smooth nut butters     Higher-fat cuts of meats such as ribs, T-bone steak, regular hamburger (15% to 20% fat) Full-fat processed meats (hot dogs, bologna, salami, sausage, bacon, etc) Red meats Organ meats (liver, brains, sweetbreads) Poultry with skin Fried meat, poultry, tofu, and fish Whole eggs and egg yolks Full-fat refried beans Tree nuts and peanuts  Dairy and Dairy Alternatives 1% or fat-free dairy (milk, yogurt, cheese, cottage cheese, sour cream) Frozen yogurt Fortified non-dairy milk (almond, rice, soy, etc.) Creamy/cheesy  sauces Cream Whole-fat or reduced-fat (2%) dairy (milk, yogurt, ice cream, cheese) Milkshakes Half-and-half Cream cheese Sour cream Coconut milk   Vegetables All fresh, frozen, or canned vegetables Fried or stir-fried vegetables Vegetables prepared with butter, cheese, or cream sauce  Fruit All fresh, frozen, or canned fruit Fried fruits Fruit served with butter or cream  Fats and Oils All vegetable oils   Butter, stick margarine, shortening, partially hydrogenated oils, tropical oils (coconut, palm, and palm kernel oils)

## 2021-11-09 NOTE — Progress Notes (Signed)
Physical Therapy Treatment Patient Details Name: Sean ERNY MRN: [id number] DOB: 09/26/1945 Today's Date: 11/09/2021   History of Present Illness Sean Golden is a 76 y.o. male with medical history significant of with history of afib, essential hypertension, GERD, H. pylori, sleep apnea, and more presents the ED with a chief complaint of stomach pain.  Patient reports he had dinner and at 6:30 PM he had acute onset of severe, cramping epigastric pain.  After some time the pain started radiating to the right and down slightly to the level of the umbilicus.  Patient reports he has had nausea and dry heaves but no vomiting.  He has not had any loose stools.  His last normal bowel movement was the previous day.  Patient has never had pancreatitis before.  Patient has never had gallbladder concerns before.  He does not have any fevers.  He did have some hypoxia in the ER from breathing shallow because it hurts to take a deep breath, but has been maintaining his oxygen sats with 2 L nasal cannula.  He denies any melena, hematochezia, dysuria, hematuria.  Patient denies any trauma to his abdomen, change in medication, trips to the [facility], familial lipid disorders, but he does drink.  He drinks about 2 glasses of wine per evening.  Patient has no other complaints at this time.    PT Comments    Patient seated EOB at beginning of session. He is able to transfer to standing with slight unsteadiness upon standing reaching for support on bed rail. Patient ambulates in room with intermittent unsteadiness reaching for walls for support. Gait improves with use of RW in hallway and he requires 1 standing rest break for fatigue. Ended session with seated exercises with rest breaks required for fatigue and SOB. Patient will benefit from continued skilled physical therapy in hospital and recommended venue below to increase strength, balance, endurance for safe ADLs and gait.   Recommendations for follow up  therapy are one component of a multi-disciplinary discharge planning process, led by the attending physician.  Recommendations may be updated based on patient status, additional functional criteria and insurance authorization.  Follow Up Recommendations  Home health PT     Assistance Recommended at Discharge PRN  Patient can return home with the following A little help with walking and/or transfers;A little help with bathing/dressing/bathroom;Help with stairs or ramp for entrance   Equipment Recommendations  None recommended by PT    Recommendations for Other Services       Precautions / Restrictions Precautions Precautions: Fall Restrictions Weight Bearing Restrictions: No     Mobility  Bed Mobility Overal bed mobility: Modified Independent             General bed mobility comments: Patient presents seated EOB    Transfers Overall transfer level: Modified independent Equipment used: None Transfers: Sit to/from Stand, Bed to chair/wheelchair/BSC Sit to Stand: Supervision, Modified independent (Device/Increase time)   Step pivot transfers: Supervision       General transfer comment: slighlty unsteady upon standing reaching for support initially    Ambulation/Gait Ambulation/Gait assistance: Modified independent (Device/Increase time), Supervision Gait Distance (Feet): 100 Feet Assistive device: None, Rolling walker (2 wheels) Gait Pattern/deviations: Decreased step length - right, Decreased step length - left, Decreased stride length Gait velocity: decreased     General Gait Details: able to ambulate with slightly unsteady cadence without RW in room with intermittent reaching for support, balance improves with RW with hallway ambulation, 1 standing rest break  for fatigue   Stairs             Wheelchair Mobility    Modified Rankin (Stroke Patients Only)       Balance Overall balance assessment: Needs assistance Sitting-balance support: Feet  supported, No upper extremity supported Sitting balance-Leahy Scale: Good Sitting balance - Comments: seated at EOB   Standing balance support: During functional activity, No upper extremity supported Standing balance-Leahy Scale: Fair Standing balance comment: fair/good using RW                            Cognition Arousal/Alertness: Awake/alert Behavior During Therapy: WFL for tasks assessed/performed Overall Cognitive Status: Within Functional Limits for tasks assessed                                          Exercises General Exercises - Lower Extremity Long Arc Quad: AROM, Both, 20 reps, Seated Hip Flexion/Marching: AROM, Both, 20 reps, Seated Toe Raises: AROM, Both, 20 reps, Seated Heel Raises: AROM, Both, 20 reps, Seated    General Comments        Pertinent Vitals/Pain Pain Assessment Pain Score: 3  Pain Location: stomach, back, legs Pain Descriptors / Indicators: Sore Pain Intervention(s): Limited activity within patient's tolerance, Monitored during session, Repositioned    Home Living                          Prior Function            PT Goals (current goals can now be found in the care plan section) Acute Rehab PT Goals Patient Stated Goal: to return home PT Goal Formulation: With patient/family Time For Goal Achievement: 11/16/21 Potential to Achieve Goals: Good Progress towards PT goals: Progressing toward goals    Frequency    Min 2X/week      PT Plan Current plan remains appropriate    Co-evaluation              AM-PAC PT "6 Clicks" Mobility   Outcome Measure  Help needed turning from your back to your side while in a flat bed without using bedrails?: A Little Help needed moving from lying on your back to sitting on the side of a flat bed without using bedrails?: A Little Help needed moving to and from a bed to a chair (including a wheelchair)?: None Help needed standing up from a chair using  your arms (e.g., wheelchair or bedside chair)?: None Help needed to walk in hospital room?: A Little Help needed climbing 3-5 steps with a railing? : A Little 6 Click Score: 20    End of Session Equipment Utilized During Treatment: Gait belt Activity Tolerance: Patient tolerated treatment well;Patient limited by fatigue Patient left: in chair;with family/visitor present;with call bell/phone within reach Nurse Communication: Mobility status PT Visit Diagnosis: Unsteadiness on feet (R26.81);Other abnormalities of gait and mobility (R26.89);Muscle weakness (generalized) (M62.81)     Time: 0354-6568 PT Time Calculation (min) (ACUTE ONLY): 24 min  Charges:  $Therapeutic Exercise: 8-22 mins $Therapeutic Activity: 8-22 mins                     10:00 AM, 11/09/21 Mearl Latin PT, DPT Physical Therapist at [facility]

## 2021-11-09 NOTE — Care Management Important Message (Signed)
Important Message  Patient Details  Name: TALYN DESSERT MRN: [id number] Date of Birth: [birth year]   Medicare Important Message Given:  Yes     Tommy Medal 11/09/2021, 11:27 AM

## 2021-11-09 NOTE — Progress Notes (Signed)
PROGRESS NOTE    Sean Golden  ATF:[id number] DOB: [birth year] DOA: 10/27/2021 PCP: Celene Squibb, MD     Brief Narrative:  Sean Golden, 76 y.o. WM PMHx A-fib on Eliquis, HTN, GERD/erosive esophagitis, H. Pylori and OSA   Presenting with acute onset cramping epigastric abdominal pain radiating to his right abdomen after he had a dinner, and admitted for acute pancreatitis.  Lipase elevated to 1680.  Mild LFT elevation with pattern consistent with EtOH or rhabdo but CK within normal.  Patient admits to drinking 2 to 3 glasses of wine before bedtime.  CTA C/A/P concerning for acute pancreatitis without necrosis or pseudocyst.  CT also showed fluid distention of the stomach and esophagus concerning for GOO or an GERD.  Started on IV fluid, IV analgesics and antiemetics, and admitted.  GI consulted.     RUQ [place] negative for gallstone or signs of acute cholecystitis but hepatic steatosis.  Pancreatitis improving clinically and chemically.  However, patient developed respiratory distress with hypoxia requiring supplemental oxygen likely from atelectasis and IV fluid.  IV fluid discontinued.  Started on IV Lasix. CT chest 2/7-New development of bilateral basilar consolidation or atelectasis in the lungs, New finding of peripancreatic gas suggesting necrotizing infection or fistula. Patient further completed CT abdomen.2/11-although clinically stable had bump in WBC count discussed with ID, IR and GI-for possible aspiration but no fluid to aspirate as per IR, continued on IV meropenem-with plan to complete 7 days course.GI signed off 2/12.   Subjective: 2/17 afebrile overnight A/O x4.  Negative abdominal pain today.  Would like to attempt to advance to bland diet.  States overnight refractory hiccups returned.    Assessment & Plan: Covid vaccination;   Principal Problem:   Acute necrotizing pancreatitis Active Problems:   Essential hypertension   Paroxysmal atrial fibrillation (HCC)    Gastroesophageal reflux disease   Acute respiratory failure with hypoxia (HCC)   Mixed hyperlipidemia   Hypokalemia   Transaminitis with hyperbilirubinemia   Lactic acidosis   Polycythemia   Bandemia   Obesity with sleep apnea   AKI (acute kidney injury) ([place])   Metabolic acidosis   Acute pancreatitis   Epigastric pain   Hiccups   Fluid overload   Morbidly obese (HCC)   Leukocytosis  Acute necrotizing pancreatitis- (present on admission) -CT chest 2/7-new finding of peripancreatic edema suggestive of necrotizing infection or fistula-started on Unasyn  -2/7-CT pancreas 2/9-signs of severe pancreatitis also some locules of gas adjacent to the duodenum-as well as other findings see the report, antibiotic changed to meropenem 2/9- and had CT of the abd w/ po contrast-no obvious perforation or source of gas from at this time. Now tolerating diet-low fat, clinically improving although hiccups bothering him.  Continue OOB, PTOT.Pain control. He had worsening leukocytosis and discussed with GI, ID and with Dr. Dwaine Gale from IR 2/11-no fluid to aspirate for culture. WBC now improving -will complete 7 days of antibiotics. -2/15 trend lipase Lab Results  Component Value Date   LIPASE 49 11/09/2021   LIPASE 42 11/08/2021   LIPASE 39 11/07/2021   LIPASE 32 11/05/2021   LIPASE 47 10/31/2021  -2/16 WNL with full liquid diet - 2/15 restart patient on full liquid diet. - 2/17 start bland diet -2/17 patient and family counseled that when they go home his diet will have to be changed to a pancreatic diet.  That his pancreatic pain may never fully resolve.   Bandemia- (present on admission) -Bandemia likely in the setting of  pancreatitis.  Initial leukocytosis had resolved but started uptrending, now improving,complete antibiotics.See acute pancreatitis.procalcitonin improving  Leukocytosis - 2/16 continues to improve Lab Results  Component Value Date   WBC 15.4 (H) 11/09/2021   WBC 16.7 (H)  11/08/2021   WBC 19.2 (H) 11/06/2021   WBC 24.3 (H) 11/05/2021   WBC 26.3 (H) 11/04/2021  -Trending down  Acute respiratory failure with hypoxia (HCC) -Resolved. -2/15 Obtain ambulatory SPO2: Patient meets criteria for home O2 SATURATION QUALIFICATIONS: (This note is used to comply with regulatory documentation for home oxygen) Patient Saturations on Room Air at Rest = 91%  Patient Saturations on Room Air while Ambulating = 86%  Patient Saturations on 2 Liters of oxygen while Ambulating = 94%  Please briefly explain why patient needs home oxygen: Pt was ambulated without oxygen and o2 dropped to 86% -2 L O2 via [place] titrate to maintain SPO2> 92% - Obtain Inogen portable home O2 generator   Paroxysmal atrial fibrillation ([place])- (present on admission) -Rate controlled on Cardizem 240 daily(,at home additionally on 120 mg in evening).  -Eliquis 5 mg BID -Continue to hold losartan -2/17 currently NSR  CHF - Previous echocardiogram on 2/8 shows normal EF with moderate LVH, indeterminate diastolic parameters.   -CHF ruled out  Essential HTN - See A-fib   Fluid overload Patient appears bloated and has lower leg edema, baseline weight around 260s prior to admission currently 287.   -Per patient and family baseline weight~265 pounds (120 kg) - Strict in and out -3.9 L, 2/17 - Daily weight Filed Weights   11/08/21 0500 11/08/21 0615 11/09/21 0500  Weight: 127.3 kg 127.2 kg 125.3 kg  -2/15 Albumin 50 g +Lasix IV 60 mg x 1 - 2/16 Albumin 25 g+ Lasix IV 60 mg x 1 -2/17 Albumin 50 g + Lasix IV 60 mg x 2   Refractory hiccups -Multifactorial acute pancreatitis and fluid overload. - 2/17 Thorazine IV 25 mg x 1    Metabolic acidosis -Bicarb is stable,monitor -2/15 resolved   AKI (acute kidney injury) [facility]) Lab Results  Component Value Date   CREATININE 0.68 11/09/2021   CREATININE 0.69 11/08/2021   CREATININE 0.68 11/07/2021   CREATININE 0.69 11/06/2021   CREATININE 0.67  11/05/2021  -Resolved   Obesity with sleep apnea -Will benefit with PCP follow-up, weight loss.   -Cont home CPAP   Polycythemia -Likely from hemconcentration and sleep apnea related.   -Resolved    Lactic acidosis -In the setting of pancreatitis and dehydration. Resolved.    Transaminitis with hyperbilirubinemia- (present on admission) -Pattern consistent with alcohol or rhabdo but CK within normal.  Improved.   Hypokalemia- (present on admission) - 2/16 potassium goal> 4 - 2/17 potassium p.o 40 mEq.  X2 doses  Hypomagnesmia - 2/16 magnesium goal> 2   Mixed hyperlipidemia- (present on admission) -Holding statin   Gastroesophageal reflux disease- (present on admission) -Fluid within distal esophagus suggesting GERD as he reports heartburn.Cont PPI bid.  Morbidly obese (BMI 41.0 kg/m)    DVT prophylaxis:  SCDs Start: 10/28/21 0322 heaprin gtt Code Status: Full Family Communication: 2/17 wife and 2 daughters at bedside for discussion of plan of care all questions answered Status is: Inpatient    Dispo: The patient is from: Home              Anticipated d/c is to: Home              Anticipated d/c date is: 3 days  Patient currently is not medically stable to d/c.      Consultants:  GI   Procedures/Significant Events:    I have personally reviewed and interpreted all radiology studies and my findings are as above.  VENTILATOR SETTINGS: Room air 2/17 SPO2 93%   Cultures   Antimicrobials:    Devices    LINES / TUBES:      Continuous Infusions:     Objective: Vitals:   11/08/21 1542 11/08/21 2244 11/09/21 0500 11/09/21 0527  BP: 129/75 128/80  130/89  Pulse: 98 100  95  Resp: 20 19  19   Temp: 98.6 F (37 C) 97.9 F (36.6 C)  98.2 F (36.8 C)  TempSrc: Oral     SpO2: 92% 91%  94%  Weight:   125.3 kg   Height:        Intake/Output Summary (Last 24 hours) at 11/09/2021 1194 Last data filed at 11/09/2021 0600 Gross  per 24 hour  Intake 1340 ml  Output 3525 ml  Net -2185 ml    Filed Weights   11/08/21 0500 11/08/21 0615 11/09/21 0500  Weight: 127.3 kg 127.2 kg 125.3 kg    Examination:  General: A/O x4, No acute respiratory distress Eyes: negative scleral hemorrhage, negative anisocoria, negative icterus ENT: Negative Runny nose, negative gingival bleeding, Neck:  Negative scars, masses, torticollis, lymphadenopathy, JVD Lungs: Clear to auscultation bilaterally without wheezes or crackles Cardiovascular: Regular rate and rhythm without murmur gallop or rub normal S1 and S2 Abdomen: MORBIDLY OBESE, negative abdominal pain, nondistended, positive soft, bowel sounds, no rebound, no ascites, no appreciable mass Extremities: No significant cyanosis, clubbing, or edema bilateral lower extremities Skin: Negative rashes, lesions, ulcers Psychiatric:  Negative depression, negative anxiety, negative fatigue, negative mania  Central nervous system:  Cranial nerves II through XII intact, tongue/uvula midline, all extremities muscle strength 5/5, sensation intact throughout, negative dysarthria, negative expressive aphasia, negative receptive aphasia.  .     Data Reviewed: Care during the described time interval was provided by me .  I have reviewed this patient's available data, including medical history, events of note, physical examination, and all test results as part of my evaluation.  CBC: Recent Labs  Lab 11/04/21 0318 11/05/21 0351 11/06/21 0444 11/08/21 0423 11/09/21 0355  WBC 26.3* 24.3* 19.2* 16.7* 15.4*  NEUTROABS  --   --   --  13.1* 12.0*  HGB 12.8* 12.7* 12.7* 11.8* 11.9*  HCT 38.2* 37.1* 37.4* 35.6* 37.1*  MCV 94.1 94.4 96.4 97.0 95.9  PLT 271 328 374 506* 549*    Basic Metabolic Panel: Recent Labs  Lab 11/04/21 0318 11/05/21 0351 11/06/21 0444 11/07/21 0729 11/08/21 0423 11/09/21 0355  NA 133* 133* 133* 130* 132* 133*  K 3.2* 3.3* 3.8 3.5 3.2* 3.6  CL 103 100 102 101  97* 99  CO2 21* 21* 20* 23 21* 23  GLUCOSE 152* 145* 143* 153* 146* 146*  BUN 10 10 12 15 13 13   CREATININE 0.70 0.67 0.69 0.68 0.69 0.68  CALCIUM 7.5* 7.6* 7.7* 7.7* 7.8* 8.0*  MG 1.7  --   --  1.7 1.7 2.0  PHOS  --   --   --  3.8 3.2 3.3    GFR: Estimated Creatinine Clearance: 109.2 mL/min (by C-G formula based on SCr of 0.68 mg/dL). Liver Function Tests: Recent Labs  Lab 11/05/21 0351 11/06/21 0444 11/07/21 0729 11/08/21 0423 11/09/21 0355  AST 30 28 38 35 47*  ALT 24 22 27 24 30   ALKPHOS  64 64 63 55 62  BILITOT 0.8 0.9 0.7 0.9 0.7  PROT 5.0* 4.9* 5.0* 4.9* 5.4*  ALBUMIN 1.9* 1.8* 1.9* 2.3* 2.4*    Recent Labs  Lab 11/05/21 0351 11/07/21 0729 11/08/21 0423 11/09/21 0355  LIPASE 32 39 42 49    No results for input(s): AMMONIA in the last 168 hours. Coagulation Profile: No results for input(s): INR, PROTIME in the last 168 hours. Cardiac Enzymes: No results for input(s): CKTOTAL, CKMB, CKMBINDEX, TROPONINI in the last 168 hours. BNP (last 3 results) No results for input(s): PROBNP in the last 8760 hours. HbA1C: No results for input(s): HGBA1C in the last 72 hours. CBG: No results for input(s): GLUCAP in the last 168 hours. Lipid Profile: No results for input(s): CHOL, HDL, LDLCALC, TRIG, CHOLHDL, LDLDIRECT in the last 72 hours. Thyroid Function Tests: No results for input(s): TSH, T4TOTAL, FREET4, T3FREE, THYROIDAB in the last 72 hours. Anemia Panel: No results for input(s): VITAMINB12, FOLATE, FERRITIN, TIBC, IRON, RETICCTPCT in the last 72 hours. Sepsis Labs: Recent Labs  Lab 11/04/21 0318 11/05/21 0351 11/06/21 0444  PROCALCITON 1.62 1.20 0.96     Recent Results (from the past 240 hour(s))  MRSA Next Gen by PCR, Nasal     Status: None   Collection Time: 10/30/21  5:46 PM   Specimen: Nasal Mucosa; Nasal Swab  Result Value Ref Range Status   MRSA by PCR Next Gen NOT DETECTED NOT DETECTED Final    Comment: (NOTE) The GeneXpert MRSA Assay (FDA  approved for NASAL specimens only), is one component of a comprehensive MRSA colonization surveillance program. It is not intended to diagnose MRSA infection nor to guide or monitor treatment for MRSA infections. Test performance is not FDA approved in patients less than 32 years old. Performed at [facility], [address] [place], [place] [address]           Radiology Studies: No results found.      Scheduled Meds:  apixaban  5 mg Oral BID   brimonidine  1 drop Right Eye BID   Chlorhexidine Gluconate Cloth  6 each Topical Daily   diltiazem  240 mg Oral Daily   pantoprazole (PROTONIX) IV  40 mg Intravenous Q12H   polyethylene glycol  17 g Oral BID   tamsulosin  0.8 mg Oral QPM   Continuous Infusions:     LOS: 12 days    Time spent:40 min    , Geraldo Docker, MD [facility]   If 7PM-7AM, please contact night-coverage 11/09/2021, 9:38 AM

## 2021-11-09 NOTE — Progress Notes (Signed)
Patient using home CPAP unit independently.  

## 2021-11-10 LAB — COMPREHENSIVE METABOLIC PANEL
ALT: 30 U/L (ref 0–44)
AST: 45 U/L — ABNORMAL HIGH (ref 15–41)
Albumin: 2.8 g/dL — ABNORMAL LOW (ref 3.5–5.0)
Alkaline Phosphatase: 60 U/L (ref 38–126)
Anion gap: 12 (ref 5–15)
BUN: 12 mg/dL (ref 8–23)
CO2: 22 mmol/L (ref 22–32)
Calcium: 8.5 mg/dL — ABNORMAL LOW (ref 8.9–10.3)
Chloride: 100 mmol/L (ref 98–111)
Creatinine, Ser: 0.7 mg/dL (ref 0.61–1.24)
GFR, Estimated: >60 mL/min (ref >60)
Glucose, Bld: 144 mg/dL — ABNORMAL HIGH (ref 70–99)
Potassium: 4.1 mmol/L (ref 3.5–5.1)
Sodium: 134 mmol/L — ABNORMAL LOW (ref 135–145)
Total Bilirubin: 0.9 mg/dL (ref 0.3–1.2)
Total Protein: 5.8 g/dL — ABNORMAL LOW (ref 6.5–8.1)

## 2021-11-10 LAB — CBC WITH DIFFERENTIAL/PLATELET
Abs Immature Granulocytes: 0.28 K/uL — ABNORMAL HIGH (ref 0.00–0.07)
Basophils Absolute: 0.1 K/uL (ref 0.0–0.1)
Basophils Relative: 1 %
Eosinophils Absolute: 0 K/uL (ref 0.0–0.5)
Eosinophils Relative: 0 %
HCT: 36.5 % — ABNORMAL LOW (ref 39.0–52.0)
Hemoglobin: 11.7 g/dL — ABNORMAL LOW (ref 13.0–17.0)
Immature Granulocytes: 2 %
Lymphocytes Relative: 8 %
Lymphs Abs: 1.2 K/uL (ref 0.7–4.0)
MCH: 30.6 pg (ref 26.0–34.0)
MCHC: 32.1 g/dL (ref 30.0–36.0)
MCV: 95.5 fL (ref 80.0–100.0)
Monocytes Absolute: 1.7 K/uL — ABNORMAL HIGH (ref 0.1–1.0)
Monocytes Relative: 11 %
Neutro Abs: 11.2 K/uL — ABNORMAL HIGH (ref 1.7–7.7)
Neutrophils Relative %: 78 %
Platelets: 547 K/uL — ABNORMAL HIGH (ref 150–400)
RBC: 3.82 MIL/uL — ABNORMAL LOW (ref 4.22–5.81)
RDW: 13.3 % (ref 11.5–15.5)
WBC: 14.4 K/uL — ABNORMAL HIGH (ref 4.0–10.5)
nRBC: 0 % (ref 0.0–0.2)

## 2021-11-10 LAB — MAGNESIUM: Magnesium: 1.9 mg/dL (ref 1.7–2.4)

## 2021-11-10 LAB — PHOSPHORUS: Phosphorus: 3.2 mg/dL (ref 2.5–4.6)

## 2021-11-10 LAB — LIPASE, BLOOD: Lipase: 47 U/L (ref 11–51)

## 2021-11-10 MED ORDER — SODIUM CHLORIDE 0.9 % IV SOLN
25.0000 mg | Freq: Once | INTRAVENOUS | Status: AC
Start: 2021-11-10 — End: 2021-11-10
  Administered 2021-11-10: 25 mg via INTRAVENOUS
  Filled 2021-11-10 (×2): qty 1

## 2021-11-10 MED ORDER — BACLOFEN 10 MG PO TABS
10.0000 mg | ORAL_TABLET | Freq: Three times a day (TID) | ORAL | Status: DC
  Administered 2021-11-10 – 2021-11-12 (×6): 10 mg via ORAL
  Filled 2021-11-10 (×7): qty 1

## 2021-11-10 MED ORDER — FUROSEMIDE 10 MG/ML IJ SOLN
60.0000 mg | Freq: Once | INTRAMUSCULAR | Status: AC
  Administered 2021-11-10: 60 mg via INTRAVENOUS
  Filled 2021-11-10: qty 6

## 2021-11-10 MED ORDER — ALBUMIN HUMAN 25 % IV SOLN
25.0000 g | Freq: Once | INTRAVENOUS | Status: AC
  Administered 2021-11-10: 25 g via INTRAVENOUS
  Filled 2021-11-10: qty 100

## 2021-11-10 NOTE — Progress Notes (Signed)
Pt called to the nurses desk and requested pain medicine. This LPN went to bedside to access the pt and rate pain and location. Provided pt with options and administered  HYDROmorphone (DILAUDID) injection 0.5 mg    For pain in back that was rated at a 10. Pt lying in bed with eyes closed, with CPAP on. Able to make needs known will continue to monitor.

## 2021-11-10 NOTE — Progress Notes (Signed)
PROGRESS NOTE    Sean Golden  GYK:[id number] DOB: 08/19/1946 DOA: 10/27/2021 PCP: Celene Squibb, MD     Brief Narrative:  Sean Golden, 76 y.o. WM PMHx A-fib on Eliquis, HTN, GERD/erosive esophagitis, H. Pylori and OSA   Presenting with acute onset cramping epigastric abdominal pain radiating to his right abdomen after he had a dinner, and admitted for acute pancreatitis.  Lipase elevated to 1680.  Mild LFT elevation with pattern consistent with EtOH or rhabdo but CK within normal.  Patient admits to drinking 2 to 3 glasses of wine before bedtime.  CTA C/A/P concerning for acute pancreatitis without necrosis or pseudocyst.  CT also showed fluid distention of the stomach and esophagus concerning for GOO or an GERD.  Started on IV fluid, IV analgesics and antiemetics, and admitted.  GI consulted.     RUQ [place] negative for gallstone or signs of acute cholecystitis but hepatic steatosis.  Pancreatitis improving clinically and chemically.  However, patient developed respiratory distress with hypoxia requiring supplemental oxygen likely from atelectasis and IV fluid.  IV fluid discontinued.  Started on IV Lasix. CT chest 2/7-New development of bilateral basilar consolidation or atelectasis in the lungs, New finding of peripancreatic gas suggesting necrotizing infection or fistula. Patient further completed CT abdomen.2/11-although clinically stable had bump in WBC count discussed with ID, IR and GI-for possible aspiration but no fluid to aspirate as per IR, continued on IV meropenem-with plan to complete 7 days course.GI signed off 2/12.   Subjective: 2/18 afebrile overnight A/O x4.  States postprandial bland diet mild abdominal pain but as long as he controlled what he ate and how fast tolerable.  Hiccups still return.   Assessment & Plan: Covid vaccination;   Principal Problem:   Acute necrotizing pancreatitis Active Problems:   Essential hypertension   Paroxysmal atrial fibrillation  (HCC)   Gastroesophageal reflux disease   Acute respiratory failure with hypoxia (HCC)   Mixed hyperlipidemia   Hypokalemia   Transaminitis with hyperbilirubinemia   Lactic acidosis   Polycythemia   Bandemia   Obesity with sleep apnea   AKI (acute kidney injury) ([place])   Metabolic acidosis   Acute pancreatitis   Epigastric pain   Hiccups   Fluid overload   Morbidly obese (HCC)   Leukocytosis  Acute necrotizing pancreatitis- (present on admission) -CT chest 2/7-new finding of peripancreatic edema suggestive of necrotizing infection or fistula-started on Unasyn  -2/7-CT pancreas 2/9-signs of severe pancreatitis also some locules of gas adjacent to the duodenum-as well as other findings see the report, antibiotic changed to meropenem 2/9- and had CT of the abd w/ po contrast-no obvious perforation or source of gas from at this time. Now tolerating diet-low fat, clinically improving although hiccups bothering him.  Continue OOB, PTOT.Pain control. He had worsening leukocytosis and discussed with GI, ID and with Dr. Dwaine Gale from IR 2/11-no fluid to aspirate for culture. WBC now improving -will complete 7 days of antibiotics. -2/15 trend lipase Lab Results  Component Value Date   LIPASE 47 11/10/2021   LIPASE 49 11/09/2021   LIPASE 42 11/08/2021   LIPASE 39 11/07/2021   LIPASE 32 11/05/2021  -2/16 WNL with full liquid diet - 2/15 restart patient on full liquid diet. - 2/17 start bland diet -2/17 patient and family counseled that when they go home his diet will have to be changed to a pancreatic diet.  That his pancreatic pain may never fully resolve.   Bandemia- (present on admission) -Bandemia likely in  the setting of pancreatitis.  Initial leukocytosis had resolved but started uptrending, now improving,complete antibiotics.See acute pancreatitis.procalcitonin improving  Leukocytosis - 2/16 continues to improve Lab Results  Component Value Date   WBC 14.4 (H) 11/10/2021   WBC 15.4  (H) 11/09/2021   WBC 16.7 (H) 11/08/2021   WBC 19.2 (H) 11/06/2021   WBC 24.3 (H) 11/05/2021  -Trending down  Acute respiratory failure with hypoxia (HCC) -Resolved. -2/15 Obtain ambulatory SPO2: Patient meets criteria for home O2 SATURATION QUALIFICATIONS: (This note is used to comply with regulatory documentation for home oxygen) Patient Saturations on Room Air at Rest = 91%  Patient Saturations on Room Air while Ambulating = 86%  Patient Saturations on 2 Liters of oxygen while Ambulating = 94%  Please briefly explain why patient needs home oxygen: Pt was ambulated without oxygen and o2 dropped to 86% -2 L O2 via [place] titrate to maintain SPO2> 92% - Obtain [employer] portable home O2 generator   Paroxysmal atrial fibrillation ([place])- (present on admission) -Rate controlled on Cardizem 240 daily(,at home additionally on 120 mg in evening).  -Eliquis 5 mg BID -Continue to hold losartan -2/18 currently NSR  CHF - Previous echocardiogram on 2/8 shows normal EF with moderate LVH, indeterminate diastolic parameters.   -CHF ruled out  Essential HTN - See A-fib   Fluid overload Patient appears bloated and has lower leg edema, baseline weight around 260s prior to admission currently 287.   -Per patient and family baseline weight~265 pounds (120 kg) - Strict in and out -5.5 L, 2/18 - Daily weight Filed Weights   11/08/21 0615 11/09/21 0500 11/10/21 0500  Weight: 127.2 kg 125.3 kg 122.4 kg  -2/15 Albumin 50 g +Lasix IV 60 mg x 1 - 2/16 Albumin 25 g+ Lasix IV 60 mg x 1 -2/17 Albumin 50 g + Lasix IV 60 mg x 2 -2/18 Albumin 25 g+ Lasix IV 60 mg x 1 -2/18 TED hose knee-high: 12 hours on during the day and remove at night   Refractory hiccups -Multifactorial acute pancreatitis and fluid overload. - 2/17 Thorazine IV 25 mg x 1  -2/18 RN to teach patient the following maneuvers  1. holding breath for 5 to 10 seconds as tolerated  2.  Valsalva maneuver, holding for 5 to 10 seconds  3.   Sipping on or gargling with very cold water  4.  Biting into a lemon  5.  Biting on the tongue  6.  Swallowing a teaspoon of dry granulated sugar  7.  Pressing gently but firmly on the eyeballs  8.  While sitting pulling the knees up to the chest or leaning forward to compress the chest, hold the position for 30 seconds to 1 minute if possible  9.  Drinking water through a rigid tube with a valve that require significant dissection effort. -2/18 Baclofen 10 mg TID -2/18 upon discharge if the above treatment options are not effective PCP may want to consider starting patient on Reglan 10 mg TID    Metabolic acidosis -Bicarb is stable,monitor -2/15 resolved   AKI (acute kidney injury) [facility]) Lab Results  Component Value Date   CREATININE 0.70 11/10/2021   CREATININE 0.68 11/09/2021   CREATININE 0.69 11/08/2021   CREATININE 0.68 11/07/2021   CREATININE 0.69 11/06/2021  -Resolved   Obesity with sleep apnea -Will benefit with PCP follow-up, weight loss.   -Cont home CPAP   Polycythemia -Likely from hemconcentration and sleep apnea related.   -Resolved    Lactic acidosis -In  the setting of pancreatitis and dehydration.  -Resolved.    Transaminitis with hyperbilirubinemia- (present on admission) -Pattern consistent with alcohol or rhabdo but CK within normal.  Improved.   Hypokalemia- (present on admission) - 2/16 potassium goal> 4  Hypomagnesmia - 2/16 magnesium goal> 2   Mixed hyperlipidemia- (present on admission) -Holding statin   Gastroesophageal reflux disease- (present on admission) -Fluid within distal esophagus suggesting GERD as he reports heartburn. -2/18 Protonix 40 mg BID, if patient continues to tolerate p.o. change to p.o. in a.m. .  Morbidly obese (BMI 41.0 kg/m)    DVT prophylaxis:  SCDs Start: 10/28/21 0322 heaprin gtt Code Status: Full Family Communication: 2/18 wife and daughter at bedside for discussion of plan of care all questions  answered Status is: Inpatient    Dispo: The patient is from: Home              Anticipated d/c is to: Home              Anticipated d/c date is: 3 days              Patient currently is not medically stable to d/c.      Consultants:  GI   Procedures/Significant Events:    I have personally reviewed and interpreted all radiology studies and my findings are as above.  VENTILATOR SETTINGS: Room air 2/18 SPO2 93%   Cultures   Antimicrobials:    Devices    LINES / TUBES:      Continuous Infusions:  chlorproMAZINE (THORAZINE) IV        Objective: Vitals:   11/09/21 1309 11/09/21 2033 11/10/21 0426 11/10/21 0500  BP: 129/80 (!) 148/77 121/76   Pulse: 89 95 84   Resp: 18 19 18    Temp: 98.7 F (37.1 C) 98.7 F (37.1 C) 98.3 F (36.8 C)   TempSrc: Oral     SpO2: 93% 91% 94%   Weight:    122.4 kg  Height:        Intake/Output Summary (Last 24 hours) at 11/10/2021 6213 Last data filed at 11/10/2021 0500 Gross per 24 hour  Intake 1051.24 ml  Output 2700 ml  Net -1648.76 ml    Filed Weights   11/08/21 0615 11/09/21 0500 11/10/21 0500  Weight: 127.2 kg 125.3 kg 122.4 kg    Examination:  General: A/O x4, No acute respiratory distress Eyes: negative scleral hemorrhage, negative anisocoria, negative icterus ENT: Negative Runny nose, negative gingival bleeding, Neck:  Negative scars, masses, torticollis, lymphadenopathy, JVD Lungs: Clear to auscultation bilaterally without wheezes or crackles Cardiovascular: Regular rate and rhythm without murmur gallop or rub normal S1 and S2 Abdomen: MORBIDLY OBESE, negative abdominal pain, nondistended, positive soft, bowel sounds, no rebound, no ascites, no appreciable mass Extremities: No significant cyanosis, clubbing, or edema bilateral lower extremities Skin: Negative rashes, lesions, ulcers Psychiatric:  Negative depression, negative anxiety, negative fatigue, negative mania  Central nervous system:   Cranial nerves II through XII intact, tongue/uvula midline, all extremities muscle strength 5/5, sensation intact throughout, negative dysarthria, negative expressive aphasia, negative receptive aphasia.  .     Data Reviewed: Care during the described time interval was provided by me .  I have reviewed this patient's available data, including medical history, events of note, physical examination, and all test results as part of my evaluation.  CBC: Recent Labs  Lab 11/05/21 0351 11/06/21 0444 11/08/21 0423 11/09/21 0355 11/10/21 0548  WBC 24.3* 19.2* 16.7* 15.4* 14.4*  NEUTROABS  --   --  13.1* 12.0* 11.2*  HGB 12.7* 12.7* 11.8* 11.9* 11.7*  HCT 37.1* 37.4* 35.6* 37.1* 36.5*  MCV 94.4 96.4 97.0 95.9 95.5  PLT 328 374 506* 549* 547*    Basic Metabolic Panel: Recent Labs  Lab 11/04/21 0318 11/05/21 0351 11/06/21 0444 11/07/21 0729 11/08/21 0423 11/09/21 0355 11/10/21 0548  NA 133*   < > 133* 130* 132* 133* 134*  K 3.2*   < > 3.8 3.5 3.2* 3.6 4.1  CL 103   < > 102 101 97* 99 100  CO2 21*   < > 20* 23 21* 23 22  GLUCOSE 152*   < > 143* 153* 146* 146* 144*  BUN 10   < > 12 15 13 13 12   CREATININE 0.70   < > 0.69 0.68 0.69 0.68 0.70  CALCIUM 7.5*   < > 7.7* 7.7* 7.8* 8.0* 8.5*  MG 1.7  --   --  1.7 1.7 2.0 1.9  PHOS  --   --   --  3.8 3.2 3.3 3.2   < > = values in this interval not displayed.    GFR: Estimated Creatinine Clearance: 107.9 mL/min (by C-G formula based on SCr of 0.7 mg/dL). Liver Function Tests: Recent Labs  Lab 11/06/21 0444 11/07/21 0729 11/08/21 0423 11/09/21 0355 11/10/21 0548  AST 28 38 35 47* 45*  ALT 22 27 24 30 30   ALKPHOS 64 63 55 62 60  BILITOT 0.9 0.7 0.9 0.7 0.9  PROT 4.9* 5.0* 4.9* 5.4* 5.8*  ALBUMIN 1.8* 1.9* 2.3* 2.4* 2.8*    Recent Labs  Lab 11/05/21 0351 11/07/21 0729 11/08/21 0423 11/09/21 0355 11/10/21 0548  LIPASE 32 39 42 49 47    No results for input(s): AMMONIA in the last 168 hours. Coagulation Profile: No  results for input(s): INR, PROTIME in the last 168 hours. Cardiac Enzymes: No results for input(s): CKTOTAL, CKMB, CKMBINDEX, TROPONINI in the last 168 hours. BNP (last 3 results) No results for input(s): PROBNP in the last 8760 hours. HbA1C: No results for input(s): HGBA1C in the last 72 hours. CBG: No results for input(s): GLUCAP in the last 168 hours. Lipid Profile: No results for input(s): CHOL, HDL, LDLCALC, TRIG, CHOLHDL, LDLDIRECT in the last 72 hours. Thyroid Function Tests: No results for input(s): TSH, T4TOTAL, FREET4, T3FREE, THYROIDAB in the last 72 hours. Anemia Panel: No results for input(s): VITAMINB12, FOLATE, FERRITIN, TIBC, IRON, RETICCTPCT in the last 72 hours. Sepsis Labs: Recent Labs  Lab 11/04/21 0318 11/05/21 0351 11/06/21 0444  PROCALCITON 1.62 1.20 0.96     No results found for this or any previous visit (from the past 240 hour(s)).        Radiology Studies: No results found.      Scheduled Meds:  apixaban  5 mg Oral BID   brimonidine  1 drop Right Eye BID   Chlorhexidine Gluconate Cloth  6 each Topical Daily   diltiazem  240 mg Oral Daily   pantoprazole (PROTONIX) IV  40 mg Intravenous Q12H   polyethylene glycol  17 g Oral BID   tamsulosin  0.8 mg Oral QPM   Continuous Infusions:  chlorproMAZINE (THORAZINE) IV        LOS: 13 days    Time spent:40 min    , Geraldo Docker, MD [facility]   If 7PM-7AM, please contact night-coverage 11/10/2021, 9:28 AM

## 2021-11-10 NOTE — Progress Notes (Signed)
Patient continuing to have bothersome hiccups despite use of Thorazine. Patient was given a list of maneuvers provided by MD Sherral Hammers to help reduce hiccups.

## 2021-11-10 NOTE — Progress Notes (Signed)
Put sterile water in patient's machine for him.  Unit at bedside and patient places himself on/off.

## 2021-11-11 LAB — COMPREHENSIVE METABOLIC PANEL
ALT: 31 U/L (ref 0–44)
AST: 46 U/L — ABNORMAL HIGH (ref 15–41)
Albumin: 2.8 g/dL — ABNORMAL LOW (ref 3.5–5.0)
Alkaline Phosphatase: 63 U/L (ref 38–126)
Anion gap: 8 (ref 5–15)
BUN: 14 mg/dL (ref 8–23)
CO2: 29 mmol/L (ref 22–32)
Calcium: 8.5 mg/dL — ABNORMAL LOW (ref 8.9–10.3)
Chloride: 98 mmol/L (ref 98–111)
Creatinine, Ser: 0.82 mg/dL (ref 0.61–1.24)
GFR, Estimated: >60 mL/min (ref >60)
Glucose, Bld: 148 mg/dL — ABNORMAL HIGH (ref 70–99)
Potassium: 3.9 mmol/L (ref 3.5–5.1)
Sodium: 135 mmol/L (ref 135–145)
Total Bilirubin: 0.8 mg/dL (ref 0.3–1.2)
Total Protein: 6 g/dL — ABNORMAL LOW (ref 6.5–8.1)

## 2021-11-11 LAB — CBC WITH DIFFERENTIAL/PLATELET
Abs Immature Granulocytes: 0.24 10*3/uL — ABNORMAL HIGH (ref 0.00–0.07)
Basophils Absolute: 0.1 10*3/uL (ref 0.0–0.1)
Basophils Relative: 1 %
Eosinophils Absolute: 0 10*3/uL (ref 0.0–0.5)
Eosinophils Relative: 0 %
HCT: 37.2 % — ABNORMAL LOW (ref 39.0–52.0)
Hemoglobin: 12.4 g/dL — ABNORMAL LOW (ref 13.0–17.0)
Immature Granulocytes: 2 %
Lymphocytes Relative: 10 %
Lymphs Abs: 1.3 10*3/uL (ref 0.7–4.0)
MCH: 32.5 pg (ref 26.0–34.0)
MCHC: 33.3 g/dL (ref 30.0–36.0)
MCV: 97.4 fL (ref 80.0–100.0)
Monocytes Absolute: 1.7 10*3/uL — ABNORMAL HIGH (ref 0.1–1.0)
Monocytes Relative: 13 %
Neutro Abs: 9.8 10*3/uL — ABNORMAL HIGH (ref 1.7–7.7)
Neutrophils Relative %: 74 %
Platelets: 579 10*3/uL — ABNORMAL HIGH (ref 150–400)
RBC: 3.82 MIL/uL — ABNORMAL LOW (ref 4.22–5.81)
RDW: 13.4 % (ref 11.5–15.5)
WBC: 13.1 10*3/uL — ABNORMAL HIGH (ref 4.0–10.5)
nRBC: 0 % (ref 0.0–0.2)

## 2021-11-11 LAB — LIPASE, BLOOD: Lipase: 51 U/L (ref 11–51)

## 2021-11-11 LAB — PHOSPHORUS: Phosphorus: 4.5 mg/dL (ref 2.5–4.6)

## 2021-11-11 LAB — MAGNESIUM: Magnesium: 2 mg/dL (ref 1.7–2.4)

## 2021-11-11 MED ORDER — HYDROMORPHONE HCL 1 MG/ML IJ SOLN: 0.2500 mg | INTRAMUSCULAR | Status: DC | PRN

## 2021-11-11 MED ORDER — PANTOPRAZOLE SODIUM 40 MG PO TBEC
40.0000 mg | DELAYED_RELEASE_TABLET | Freq: Two times a day (BID) | ORAL | Status: DC
  Administered 2021-11-11 – 2021-11-12 (×3): 40 mg via ORAL
  Filled 2021-11-11 (×3): qty 1

## 2021-11-11 MED ORDER — ALBUMIN HUMAN 25 % IV SOLN
25.0000 g | Freq: Once | INTRAVENOUS | Status: AC
  Administered 2021-11-11: 25 g via INTRAVENOUS
  Filled 2021-11-11: qty 100

## 2021-11-11 MED ORDER — FUROSEMIDE 10 MG/ML IJ SOLN
60.0000 mg | Freq: Once | INTRAMUSCULAR | Status: AC
  Administered 2021-11-11: 60 mg via INTRAVENOUS
  Filled 2021-11-11: qty 6

## 2021-11-11 NOTE — Progress Notes (Addendum)
Patient self manages his own home unit at bedside. Did had sterile water to machine for patient.

## 2021-11-11 NOTE — Progress Notes (Signed)
PROGRESS NOTE    Sean Golden  ZJI:[id number] DOB: 07-14-1946 DOA: 10/27/2021 PCP: Celene Squibb, MD   Brief Narrative:  Sean Golden, 76 y.o. WM PMHx A-fib on Eliquis, HTN, GERD/erosive esophagitis, H. Pylori and OSA   Presenting with acute onset cramping epigastric abdominal pain radiating to his right abdomen after he had a dinner, and admitted for acute pancreatitis.  Lipase elevated to 1680.  Mild LFT elevation with pattern consistent with EtOH or rhabdo but CK within normal.  Patient admits to drinking 2 to 3 glasses of wine before bedtime.  CTA C/A/P concerning for acute pancreatitis without necrosis or pseudocyst.  CT also showed fluid distention of the stomach and esophagus concerning for GOO or an GERD.  Started on IV fluid, IV analgesics and antiemetics, and admitted.  GI consulted.     RUQ [place] negative for gallstone or signs of acute cholecystitis but hepatic steatosis.  Pancreatitis improving clinically and chemically.  However, patient developed respiratory distress with hypoxia requiring supplemental oxygen likely from atelectasis and IV fluid.  IV fluid discontinued.  Started on IV Lasix.  CT chest 2/7-New development of bilateral basilar consolidation or atelectasis in the lungs, New finding of peripancreatic gas suggesting necrotizing infection or fistula.  Patient further completed CT abdomen.2/11-although clinically stable had bump in WBC count discussed with ID, IR and GI-for possible aspiration but no fluid to aspirate as per IR, continued on IV meropenem-with plan to complete 7 days course.GI signed off 2/12.   Subjective: 2/19 - Pt reports that hiccups are much better today.  He is tolerating diet.  Only having pain occasionally.  Had some abd/back pain last night but now has resolved. He is agreeable that his weight is coming down to his baseline as he diureses and agreeable to going home with home health in AM 2/20.   Assessment & Plan: Covid vaccination;   Principal  Problem:   Acute necrotizing pancreatitis Active Problems:   Essential hypertension   Paroxysmal atrial fibrillation (HCC)   Gastroesophageal reflux disease   Acute respiratory failure with hypoxia (HCC)   Mixed hyperlipidemia   Hypokalemia   Transaminitis with hyperbilirubinemia   Lactic acidosis   Polycythemia   Bandemia   Obesity with sleep apnea   AKI (acute kidney injury) ([place])   Metabolic acidosis   Acute pancreatitis   Epigastric pain   Hiccups   Fluid overload   Morbidly obese (HCC)   Leukocytosis  Acute necrotizing pancreatitis- (present on admission) -CT chest 2/7-new finding of peripancreatic edema suggestive of necrotizing infection or fistula-started on Unasyn  -2/7-CT pancreas 2/9-signs of severe pancreatitis also some locules of gas adjacent to the duodenum-as well as other findings see the report, antibiotic changed to meropenem 2/9- and had CT of the abd w/ po contrast-no obvious perforation or source of gas from at this time. Now tolerating diet-low fat, clinically improving although hiccups bothering him.  Continue OOB, PTOT.Pain control. He had worsening leukocytosis and discussed with GI, ID and with Dr. Dwaine Gale from IR 2/11-no fluid to aspirate for culture. WBC now improving -will complete 7 days of antibiotics. -2/15 trend lipase Lab Results  Component Value Date   LIPASE 51 11/11/2021   LIPASE 47 11/10/2021   LIPASE 49 11/09/2021   LIPASE 42 11/08/2021   LIPASE 39 11/07/2021  -2/16 WNL with full liquid diet - 2/15 restart patient on full liquid diet. - 2/17 start bland diet -2/17 patient and family counseled that when they go home his diet  will have to be changed to a pancreatic diet.  That his pancreatic pain may never fully resolve.   Bandemia- (present on admission) -Bandemia likely in the setting of pancreatitis.  Initial leukocytosis had resolved but started uptrending, now improving,complete antibiotics.See acute pancreatitis.procalcitonin  improving  Leukocytosis - 2/16 continues to improve Lab Results  Component Value Date   WBC 13.1 (H) 11/11/2021   WBC 14.4 (H) 11/10/2021   WBC 15.4 (H) 11/09/2021   WBC 16.7 (H) 11/08/2021   WBC 19.2 (H) 11/06/2021  -Trending down  Acute respiratory failure with hypoxia (HCC) -Resolved. -2/15 Obtain ambulatory SPO2: Patient meets criteria for home O2 SATURATION QUALIFICATIONS: (This note is used to comply with regulatory documentation for home oxygen) Patient Saturations on Room Air at Rest = 91%  Patient Saturations on Room Air while Ambulating = 86%  Patient Saturations on 2 Liters of oxygen while Ambulating = 94%  Please briefly explain why patient needs home oxygen: Pt was ambulated without oxygen and o2 dropped to 86% -2 L O2 via [place] titrate to maintain SPO2> 92% - Obtain Inogen portable home O2 generator   Paroxysmal atrial fibrillation ([place])- (present on admission) -Rate controlled on Cardizem 240 daily(,at home additionally on 120 mg in evening).  -Eliquis 5 mg BID -Continue to hold losartan -2/18 currently NSR  CHF - Previous echocardiogram on 2/8 shows normal EF with moderate LVH, indeterminate diastolic parameters.   -CHF ruled out  Essential HTN - See A-fib   Fluid overload Patient appears bloated and has lower leg edema, baseline weight around 260s prior to admission currently 287.   -Per patient and family baseline weight~265 pounds (120 kg) - Strict in and out -5.5 L, 2/18 - Daily weight Filed Weights   11/09/21 0500 11/10/21 0500 11/11/21 0500  Weight: 125.3 kg 122.4 kg 120 kg  -2/15 Albumin 50 g +Lasix IV 60 mg x 1 - 2/16 Albumin 25 g+ Lasix IV 60 mg x 1 -2/17 Albumin 50 g + Lasix IV 60 mg x 2 -2/18 Albumin 25 g+ Lasix IV 60 mg x 1 -2/18 TED hose knee-high: 12 hours on during the day and remove at night   Refractory hiccups -Multifactorial acute pancreatitis and fluid overload. - 2/17 Thorazine IV 25 mg x 1  -2/18 RN to teach patient the  following maneuvers  1. holding breath for 5 to 10 seconds as tolerated  2.  Valsalva maneuver, holding for 5 to 10 seconds  3.  Sipping on or gargling with very cold water  4.  Biting into a lemon  5.  Biting on the tongue  6.  Swallowing a teaspoon of dry granulated sugar  7.  Pressing gently but firmly on the eyeballs  8.  While sitting pulling the knees up to the chest or leaning forward to compress the chest, hold the position for 30 seconds to 1 minute if possible  9.  Drinking water through a rigid tube with a valve that require significant dissection effort. -2/18 Baclofen 10 mg TID -2/18 upon discharge if the above treatment options are not effective PCP may want to consider starting patient on Reglan 10 mg TID --2/19: Improved some today.  Not as bad as yesterday.    Metabolic acidosis -Bicarb is stable,monitor -2/15 resolved   AKI (acute kidney injury) [facility]) Lab Results  Component Value Date   CREATININE 0.82 11/11/2021   CREATININE 0.70 11/10/2021   CREATININE 0.68 11/09/2021   CREATININE 0.69 11/08/2021   CREATININE 0.68 11/07/2021  -Resolved  Obesity with sleep apnea -Will benefit with PCP follow-up, weight loss.   -Cont home nightly CPAP   Polycythemia -Likely from hemconcentration and sleep apnea related.   -Resolved    Lactic acidosis -In the setting of pancreatitis and dehydration.  -Resolved.    Transaminitis with hyperbilirubinemia- (present on admission) -Pattern consistent with alcohol or rhabdo but CK within normal.  Improved.   Hypokalemia- REPLETED  - 2/16 potassium goal> 4  Hypomagnesmia - REPLETED  - 2/16 magnesium goal> 2   Mixed hyperlipidemia- (present on admission) -Holding statin   Gastroesophageal reflux disease- (present on admission) -Fluid within distal esophagus suggesting GERD as he reports heartburn. -2/18 Protonix 40 mg oral BID  Morbidly obese (BMI 41.0 kg/m)   DVT prophylaxis:  SCDs Start: 10/28/21 0322 heaprin  gtt Code Status: Full Family Communication: 2/18 wife and daughter at bedside for discussion of plan of care all questions answered Status is: Inpatient  Dispo: The patient is from: Home              Anticipated d/c is to: Home              Anticipated d/c date is: 11/12/21              Patient currently is not medically stable to d/c.  1 more dose of IV albumin and IV lasix 2/19, Anticipate DC in AM.    Consultants:  GI - signed off   Procedures/Significant Events:   I have personally reviewed and interpreted all radiology studies and my findings are as above.  VENTILATOR SETTINGS: Room air 2/18 SPO2 93%  Cultures  Antimicrobials:  Devices  LINES / TUBES:   Continuous Infusions:  Objective: Vitals:   11/10/21 2027 11/11/21 0444 11/11/21 0500 11/11/21 1232  BP: 124/73 125/77  125/83  Pulse: 91 88  64  Resp: 18 19  20   Temp: 98.1 F (36.7 C) 97.7 F (36.5 C)  98.4 F (36.9 C)  TempSrc:    Oral  SpO2: 95% 95%  91%  Weight:   120 kg   Height:        Intake/Output Summary (Last 24 hours) at 11/11/2021 1537 Last data filed at 11/11/2021 1235 Gross per 24 hour  Intake 1080 ml  Output 4300 ml  Net -3220 ml   Filed Weights   11/09/21 0500 11/10/21 0500 11/11/21 0500  Weight: 125.3 kg 122.4 kg 120 kg   Examination:  General: A/O x4, No acute respiratory distress.  Eyes: PERRL sclera white ENT: MMM Neck:  supple, no JVD Lungs: BBS Clear to auscultation bilaterally without wheezes or crackles. No increased work of breathing.  Cardiovascular: Regular rate and rhythm without murmur gallop or rub normal S1 and S2 Abdomen: OBESE, negative abdominal pain, nondistended, positive soft, bowel sounds, no rebound, no ascites, no appreciable mass Extremities: No significant cyanosis, clubbing, or edema bilateral lower extremities.  Skin: Negative rashes, lesions, ulcers.  Psychiatric:  Negative depression, negative anxiety, negative fatigue, negative mania  Central  nervous system:  Cranial nerves II through XII intact, tongue/uvula midline, all extremities muscle strength 5/5, sensation intact throughout, negative dysarthria, negative expressive aphasia, negative receptive aphasia.  Data Reviewed: Care during the described time interval was provided by me .  I have reviewed this patient's available data, including medical history, events of note, physical examination, and all test results as part of my evaluation.  CBC: Recent Labs  Lab 11/06/21 0444 11/08/21 0423 11/09/21 0355 11/10/21 0548 11/11/21 0408  WBC 19.2* 16.7*  15.4* 14.4* 13.1*  NEUTROABS  --  13.1* 12.0* 11.2* 9.8*  HGB 12.7* 11.8* 11.9* 11.7* 12.4*  HCT 37.4* 35.6* 37.1* 36.5* 37.2*  MCV 96.4 97.0 95.9 95.5 97.4  PLT 374 506* 549* 547* 622*   Basic Metabolic Panel: Recent Labs  Lab 11/07/21 0729 11/08/21 0423 11/09/21 0355 11/10/21 0548 11/11/21 0408  NA 130* 132* 133* 134* 135  K 3.5 3.2* 3.6 4.1 3.9  CL 101 97* 99 100 98  CO2 23 21* 23 22 29   GLUCOSE 153* 146* 146* 144* 148*  BUN 15 13 13 12 14   CREATININE 0.68 0.69 0.68 0.70 0.82  CALCIUM 7.7* 7.8* 8.0* 8.5* 8.5*  MG 1.7 1.7 2.0 1.9 2.0  PHOS 3.8 3.2 3.3 3.2 4.5   GFR: Estimated Creatinine Clearance: 104.2 mL/min (by C-G formula based on SCr of 0.82 mg/dL). Liver Function Tests: Recent Labs  Lab 11/07/21 0729 11/08/21 0423 11/09/21 0355 11/10/21 0548 11/11/21 0408  AST 38 35 47* 45* 46*  ALT 27 24 30 30 31   ALKPHOS 63 55 62 60 63  BILITOT 0.7 0.9 0.7 0.9 0.8  PROT 5.0* 4.9* 5.4* 5.8* 6.0*  ALBUMIN 1.9* 2.3* 2.4* 2.8* 2.8*   Recent Labs  Lab 11/07/21 0729 11/08/21 0423 11/09/21 0355 11/10/21 0548 11/11/21 0408  LIPASE 39 42 49 47 51   No results for input(s): AMMONIA in the last 168 hours. Coagulation Profile: No results for input(s): INR, PROTIME in the last 168 hours. Cardiac Enzymes: No results for input(s): CKTOTAL, CKMB, CKMBINDEX, TROPONINI in the last 168 hours. BNP (last 3 results) No  results for input(s): PROBNP in the last 8760 hours. HbA1C: No results for input(s): HGBA1C in the last 72 hours. CBG: No results for input(s): GLUCAP in the last 168 hours. Lipid Profile: No results for input(s): CHOL, HDL, LDLCALC, TRIG, CHOLHDL, LDLDIRECT in the last 72 hours. Thyroid Function Tests: No results for input(s): TSH, T4TOTAL, FREET4, T3FREE, THYROIDAB in the last 72 hours. Anemia Panel: No results for input(s): VITAMINB12, FOLATE, FERRITIN, TIBC, IRON, RETICCTPCT in the last 72 hours. Sepsis Labs: Recent Labs  Lab 11/05/21 0351 11/06/21 0444  PROCALCITON 1.20 0.96    No results found for this or any previous visit (from the past 240 hour(s)).    Radiology Studies: No results found.  Scheduled Meds:  apixaban  5 mg Oral BID   baclofen  10 mg Oral TID   brimonidine  1 drop Right Eye BID   Chlorhexidine Gluconate Cloth  6 each Topical Daily   diltiazem  240 mg Oral Daily   pantoprazole  40 mg Oral BID   polyethylene glycol  17 g Oral BID   tamsulosin  0.8 mg Oral QPM   Continuous Infusions:   LOS: 14 days   Time spent:40 min  Irwin Brakeman, MD [facility]   If 7PM-7AM, please contact night-coverage 11/11/2021, 3:37 PM

## 2021-11-11 NOTE — Progress Notes (Signed)
Patient has had less hiccups today and not complaining of any pain or discomfort but has had very little energy and sleeping most the day, MD [facility] aware.

## 2021-11-11 NOTE — Progress Notes (Signed)
Pt requested acetaminophen (TYLENOL) tablet 650 mg    For pain stated to be rated 3 of 10

## 2021-11-12 MED ORDER — TRAMADOL HCL 50 MG PO TABS
50.0000 mg | ORAL_TABLET | Freq: Two times a day (BID) | ORAL | 0 refills | Status: DC | PRN
Start: 2021-11-12 — End: 2021-11-19

## 2021-11-12 MED ORDER — DILTIAZEM HCL ER COATED BEADS 240 MG PO CP24
240.0000 mg | ORAL_CAPSULE | Freq: Every day | ORAL | 2 refills | Status: DC
Start: 2021-11-12 — End: 2022-03-01

## 2021-11-12 MED ORDER — TAMSULOSIN HCL 0.4 MG PO CAPS: 0.8000 mg | ORAL_CAPSULE | Freq: Every evening | ORAL | 2 refills | Status: AC

## 2021-11-12 MED ORDER — PANTOPRAZOLE SODIUM 40 MG PO TBEC
40.0000 mg | DELAYED_RELEASE_TABLET | Freq: Two times a day (BID) | ORAL | 11 refills | Status: DC
Start: 2021-11-12 — End: 2022-01-10

## 2021-11-12 MED ORDER — ACETAMINOPHEN 325 MG PO TABS: 650.0000 mg | ORAL_TABLET | Freq: Four times a day (QID) | ORAL | Status: AC | PRN

## 2021-11-12 MED ORDER — POTASSIUM CHLORIDE CRYS ER 10 MEQ PO TBCR: 10.0000 meq | EXTENDED_RELEASE_TABLET | ORAL | 0 refills | Status: DC

## 2021-11-12 MED ORDER — POLYETHYLENE GLYCOL 3350 17 G PO PACK: 17.0000 g | PACK | Freq: Every day | ORAL | 2 refills | Status: DC

## 2021-11-12 MED ORDER — FUROSEMIDE 40 MG PO TABS: 40.0000 mg | ORAL_TABLET | ORAL | 0 refills | Status: DC

## 2021-11-12 NOTE — Care Management Important Message (Signed)
Important Message  Patient Details  Name: Sean Golden MRN: [id number] Date of Birth: 04/13/1946   Medicare Important Message Given:  Yes     Tommy Medal 11/12/2021, 11:49 AM

## 2021-11-12 NOTE — TOC Transition Note (Signed)
Transition of Care [facility]) - CM/SW Discharge Note   Patient Details  Name: Sean Golden MRN: [id number] Date of Birth: Jan 25, 1946  [facility] CM/SW Contact:  Salome Arnt, LCSW Phone Number: 11/12/2021, 11:03 AM   Clinical Narrative: Pt d/c today. [place] notified of d/c. Pt will have home health PT, OT, RN. No other needs reported by pt at this time.       Final next level of care: [place] Barriers to Discharge: Barriers Resolved   Patient Goals and CMS Choice Patient states their goals for this hospitalization and ongoing recovery are:: Home with [facility] CMS Medicare.gov Compare Post Acute Care list provided to:: Patient Choice offered to / list presented to : Patient, Spouse  Discharge Placement                    Patient and family notified of of transfer: 11/12/21  Discharge Plan and Services In-house Referral: Clinical Social Work Discharge Planning Services: CM Consult Post Acute Care Choice: Home Health                    HH Arranged: RN, PT, OT [facility] Agency: [place] Date [place]: 11/12/21 Time HH Agency Contacted: 1100 Representative spoke with at [place]: [place] Interventions     Readmission Risk Interventions Readmission Risk Prevention Plan 11/02/2021  Transportation Screening Complete  Home Care Screening Complete  Medication Review (RN CM) Complete  Some recent data might be hidden

## 2021-11-12 NOTE — Discharge Summary (Signed)
Physician Discharge Summary  Sean Golden:[id number] DOB: 1946/06/12 DOA: 10/27/2021  PCP: Celene Squibb, MD GI: [facility]  Cardiologist: Domenic Polite  Admit date: 10/27/2021 Discharge date: 11/12/2021  Admitted From:  Home  Disposition: Home with Home health   Recommendations for Outpatient Follow-up:  Follow up with PCP in 1 weeks Follow up with [facility] Neil Crouch 11/27/21 2:30 pm Follow up with Cardiology on 11/29/21 3:20 pm Please check BMP in 1-2 weeks to follow up electrolytes  Home Health:  PT, OT, RN   Discharge Condition: STABLE   CODE STATUS: FULL  DIET: soft foods diet until follow up with GI    Brief Hospitalization Summary: Please see all hospital notes, images, labs for full details of the hospitalization. Admission HPI:  76 y.o. male with medical history significant of with history of afib, essential hypertension, GERD, H. pylori, sleep apnea, and more presents the ED with a chief complaint of stomach pain.  Patient reports he had dinner and at 6:30 PM he had acute onset of severe, cramping epigastric pain.  After some time the pain started radiating to the right and down slightly to the level of the umbilicus.  Patient reports he has had nausea and dry heaves but no vomiting.  He has not had any loose stools.  His last normal bowel movement was the previous day.  Patient has never had pancreatitis before.  Patient has never had gallbladder concerns before.  He does not have any fevers.  He did have some hypoxia in the ER from breathing shallow because it hurts to take a deep breath, but has been maintaining his oxygen sats with 2 L nasal cannula.  He denies any melena, hematochezia, dysuria, hematuria.  Patient denies any trauma to his abdomen, change in medication, trips to the [facility], familial lipid disorders, but he does drink.  He drinks about 2 glasses of wine per evening.  Patient has no other complaints at this time.   Patient does not smoke, he does not use  illicit drugs.  He is vaccinated for COVID.   Presenting with acute onset cramping epigastric abdominal pain radiating to his right abdomen after he had a dinner, and admitted for acute pancreatitis.  Lipase elevated to 1680.  Mild LFT elevation with pattern consistent with EtOH or rhabdo but CK within normal.  Patient admits to drinking 2 to 3 glasses of wine before bedtime.  CTA C/A/P concerning for acute pancreatitis without necrosis or pseudocyst.  CT also showed fluid distention of the stomach and esophagus concerning for GOO or an GERD.  Started on IV fluid, IV analgesics and antiemetics, and admitted.  GI consulted.     RUQ [place] negative for gallstone or signs of acute cholecystitis but hepatic steatosis.  Pancreatitis improving clinically and chemically.  However, patient developed respiratory distress with hypoxia requiring supplemental oxygen likely from atelectasis and IV fluid.  IV fluid discontinued.  Started on IV Lasix. CT chest 2/7-New development of bilateral basilar consolidation or atelectasis in the lungs, New finding of peripancreatic gas suggesting necrotizing infection or fistula. Patient further completed CT abdomen.2/11-although clinically stable had bump in WBC count discussed with ID, IR and GI-for possible aspiration but no fluid to aspirate as per IR, continued on IV meropenem-with plan to complete 7 days course.GI signed off 2/12.  HOSPITAL COURSE BY  PROBLEM LIST   Acute necrotizing pancreatitis- (present on admission) - completed antibiotics and treatment -CT chest 2/7-new finding of peripancreatic edema suggestive of necrotizing infection  or fistula-started on Unasyn  -2/7-CT pancreas 2/9-signs of severe pancreatitis also some locules of gas adjacent to the duodenum-as well as other findings see the report, antibiotic changed to meropenem 2/9- and had CT of the abd w/ po contrast-no obvious perforation or source of gas from at this time. Now tolerating diet-low fat,  clinically improving although hiccups bothering him.  Continue OOB, PTOT.Pain control. He had worsening leukocytosis and discussed with GI, ID and with Dr. Dwaine Gale from IR 2/11-no fluid to aspirate for culture. WBC now improving -Completed 7 days of antibiotics. -2/15 trend lipase Recent Labs       Lab Results  Component Value Date    LIPASE 47 11/10/2021    LIPASE 49 11/09/2021    LIPASE 42 11/08/2021    LIPASE 39 11/07/2021    LIPASE 32 11/05/2021    -2/16 WNL with full liquid diet - 2/15 restart patient on full liquid diet. - 2/17 start bland diet -2/17 patient and family counseled that when they go home his diet will have to be changed to a pancreatic diet.  That his pancreatic pain may never fully resolve.   Bandemia- (present on admission) -Bandemia likely in the setting of pancreatitis.  Initial leukocytosis had resolved but started uptrending, now improving,completed antibiotics.See acute pancreatitis.procalcitonin improving   Leukocytosis - 2/16 continues to improve Recent Labs       Lab Results  Component Value Date    WBC 14.4 (H) 11/10/2021    WBC 15.4 (H) 11/09/2021    WBC 16.7 (H) 11/08/2021    WBC 19.2 (H) 11/06/2021    WBC 24.3 (H) 11/05/2021    -Trending down   Acute respiratory failure with hypoxia (HCC) -Resolved. -2/15 Obtain ambulatory SPO2: Patient meets criteria for home O2 SATURATION QUALIFICATIONS: (This note is used to comply with regulatory documentation for home oxygen) Patient Saturations on Room Air at Rest = 91%  Patient Saturations on Room Air while Ambulating = 86%  Patient Saturations on 2 Liters of oxygen while Ambulating = 94%  Please briefly explain why patient needs home oxygen: Pt was ambulated without oxygen and o2 dropped to 86% -2 L O2 via [place] titrate to maintain SPO2> 92% - Obtain [employer] portable home O2 generator   Paroxysmal atrial fibrillation ([place])- (present on admission) -Rate controlled on Cardizem 240 daily which we will  continue.  We did not need to give the evening dose in hospital  -Eliquis 5 mg BID -Continue to hold losartan due to softer BPs - follow up with cardiology on 11/29/21 to recheck -2/18 currently NSR   CHF - Previous echocardiogram on 2/8 shows normal EF with moderate LVH, indeterminate diastolic parameters.  Pt has diuresed 14.4 L since admission.  He will discharge home on oral lasix 40 mg every other day with potassium supplement.   Intake/Output Summary (Last 24 hours) at 11/12/2021 1136 Last data filed at 11/12/2021 1423 Gross per 24 hour  Intake 240 ml  Output 3200 ml  Net -2960 ml    Essential HTN - See A-fib   Fluid overload Patient appears bloated and has lower leg edema, baseline weight around 260s prior to admission currently 287.   -Per patient and family baseline weight~265 pounds (120 kg) - Strict in and out -5.5 L, 2/18 - Daily weight Filed Weights   11/10/21 0500 11/11/21 0500 11/12/21 0609  Weight: 122.4 kg 120 kg 116.6 kg   -2/15 Albumin 50 g +Lasix IV 60 mg x 1 - 2/16 Albumin 25 g+  Lasix IV 60 mg x 1 -2/17 Albumin 50 g + Lasix IV 60 mg x 2 -2/18 Albumin 25 g+ Lasix IV 60 mg x 1 -2/18 TED hose knee-high: 12 hours on during the day and remove at night  --2/19 Albumin 25 g + Lasix IV 60 mg x 1   He will discharge home today on lasix 40 mg every other day with potassium supplement  Continue until he follows up with cardiology on 3/9 for further recommendations Please check BMP on follow up with GI and cardiology.   Refractory hiccups - RESOLVED NOW -Multifactorial acute pancreatitis and fluid overload. - 2/17 Thorazine IV 25 mg x 1  -2/18 RN to teach patient the following maneuvers             1. holding breath for 5 to 10 seconds as tolerated             2.  Valsalva maneuver, holding for 5 to 10 seconds             3.  Sipping on or gargling with very cold water             4.  Biting into a lemon             5.  Biting on the tongue             6.   Swallowing a teaspoon of dry granulated sugar             7.  Pressing gently but firmly on the eyeballs             8.  While sitting pulling the knees up to the chest or leaning forward to compress the chest, hold the position for 30 seconds to 1 minute if possible             9.  Drinking water through a rigid tube with a valve that require significant dissection effort. -2/18 Baclofen 10 mg TID now discontinued -2/18 upon discharge if the above treatment options are not effective PCP may want to consider starting patient on Reglan 10 mg TID     Metabolic acidosis -Bicarb is stable,monitor -2/15 resolved   AKI (acute kidney injury) ([place]) Recent Labs       Lab Results  Component Value Date    CREATININE 0.70 11/10/2021    CREATININE 0.68 11/09/2021    CREATININE 0.69 11/08/2021    CREATININE 0.68 11/07/2021    CREATININE 0.69 11/06/2021    -Resolved   Obesity with sleep apnea -Will benefit with PCP follow-up, weight loss.   -Cont home CPAP   Polycythemia -Likely from hemconcentration and sleep apnea related.   -Resolved    Lactic acidosis -In the setting of pancreatitis and dehydration.  -Resolved.    Transaminitis with hyperbilirubinemia- (present on admission) -Pattern consistent with alcohol or rhabdo but CK within normal.  Improved.   Hypokalemia- (present on admission) - 2/16 potassium goal> 4  Hypomagnesmia - 2/16 magnesium goal> 2   Mixed hyperlipidemia- (present on admission) -Holding statin   Gastroesophageal reflux disease- (present on admission) -Fluid within distal esophagus suggesting GERD as he reports heartburn. -2/18 Protonix 40 mg BID, if patient continues to tolerate p.o. change to p.o. in a.m. .   Morbidly obese (BMI 41.0 kg/m)    Discharge Diagnoses:  Principal Problem:   Acute necrotizing pancreatitis Active Problems:   Essential hypertension   Paroxysmal atrial fibrillation (HCC)   Gastroesophageal reflux disease  Acute  respiratory failure with hypoxia (HCC)   Mixed hyperlipidemia   Hypokalemia   Transaminitis with hyperbilirubinemia   Lactic acidosis   Polycythemia   Bandemia   Obesity with sleep apnea   AKI (acute kidney injury) ([place])   Metabolic acidosis   Acute pancreatitis   Epigastric pain   Hiccups   Fluid overload   Morbidly obese (HCC)   Leukocytosis   Discharge Instructions:  Allergies as of 11/12/2021   No Known Allergies      Medication List     STOP taking these medications    Fish Oil 1000 MG Caps   losartan 50 MG tablet Commonly known as: COZAAR       TAKE these medications    acetaminophen 325 MG tablet Commonly known as: TYLENOL Take 2 tablets (650 mg total) by mouth every 6 (six) hours as needed for mild pain, fever or headache (or Fever >/= 101).   brimonidine 0.2 % ophthalmic solution Commonly known as: ALPHAGAN Place 1 drop into the right eye 2 (two) times daily.   diltiazem 240 MG 24 hr capsule Commonly known as: CARDIZEM CD Take 1 capsule (240 mg total) by mouth daily. What changed:  medication strength how much to take how to take this when to take this additional instructions   Eliquis 5 MG Tabs tablet Generic drug: apixaban TAKE ONE TABLET (5MG  TOTAL) BY MOUTH TWOTIMES DAILY What changed: See the new instructions.   furosemide 40 MG tablet Commonly known as: Lasix Take 1 tablet (40 mg total) by mouth every other day. Start taking on: November 14, 2021   pantoprazole 40 MG tablet Commonly known as: PROTONIX Take 1 tablet (40 mg total) by mouth 2 (two) times daily. What changed: when to take this   polyethylene glycol 17 g packet Commonly known as: MIRALAX / GLYCOLAX Take 17 g by mouth daily.   potassium chloride 10 MEQ tablet Commonly known as: KLOR-CON M Take 1 tablet (10 mEq total) by mouth every other day. Start taking on: November 14, 2021   tamsulosin 0.4 MG Caps capsule Commonly known as: FLOMAX Take 2 capsules (0.8 mg  total) by mouth every evening. What changed: how much to take   traMADol 50 MG tablet Commonly known as: ULTRAM Take 1 tablet (50 mg total) by mouth every 12 (twelve) hours as needed for severe pain. What changed: reasons to take this               Durable Medical Equipment  (From admission, onward)           Start     Ordered   11/12/21 1100  For home use only DME oxygen  Once       Question Answer Comment  Length of Need Lifetime   Mode or (Route) Nasal cannula   Liters per Minute 2   Frequency Continuous (stationary and portable oxygen unit needed)   Oxygen conserving device Yes   Oxygen delivery system Gas      11/12/21 1059            Follow-up Information     [facility]. Go on 11/27/2021.   Why: 11/27/2021 at 2:30PM Contact information: [place]        Satira Sark, MD. Go on 11/29/2021.   Specialty: Cardiology Why: as scheduled at 3:20 pm Contact information: [place] [address] [phone]         [facility],  [place] Follow up.   Specialty: Home Health Services Why: Will contact you to schedule home health visits. Contact information: [address] [place] [address] [phone]                No Known Allergies Allergies as of 11/12/2021   No Known Allergies      Medication List     STOP taking these medications    Fish Oil 1000 MG Caps   losartan 50 MG tablet Commonly known as: COZAAR       TAKE these medications    acetaminophen 325 MG tablet Commonly known as: TYLENOL Take 2 tablets (650 mg total) by mouth every 6 (six) hours as needed for mild pain, fever or headache (or Fever >/= 101).   brimonidine 0.2 % ophthalmic solution Commonly known as: ALPHAGAN Place 1 drop into the right eye 2 (two) times daily.   diltiazem 240 MG 24 hr capsule Commonly known as: CARDIZEM CD Take 1  capsule (240 mg total) by mouth daily. What changed:  medication strength how much to take how to take this when to take this additional instructions   Eliquis 5 MG Tabs tablet Generic drug: apixaban TAKE ONE TABLET (5MG  TOTAL) BY MOUTH TWOTIMES DAILY What changed: See the new instructions.   furosemide 40 MG tablet Commonly known as: Lasix Take 1 tablet (40 mg total) by mouth every other day. Start taking on: November 14, 2021   pantoprazole 40 MG tablet Commonly known as: PROTONIX Take 1 tablet (40 mg total) by mouth 2 (two) times daily. What changed: when to take this   polyethylene glycol 17 g packet Commonly known as: MIRALAX / GLYCOLAX Take 17 g by mouth daily.   potassium chloride 10 MEQ tablet Commonly known as: KLOR-CON M Take 1 tablet (10 mEq total) by mouth every other day. Start taking on: November 14, 2021   tamsulosin 0.4 MG Caps capsule Commonly known as: FLOMAX Take 2 capsules (0.8 mg total) by mouth every evening. What changed: how much to take   traMADol 50 MG tablet Commonly known as: ULTRAM Take 1 tablet (50 mg total) by mouth every 12 (twelve) hours as needed for severe pain. What changed: reasons to take this               Durable Medical Equipment  (From admission, onward)           Start     Ordered   11/12/21 1100  For home use only DME oxygen  Once       Question Answer Comment  Length of Need Lifetime   Mode or (Route) Nasal cannula   Liters per Minute 2   Frequency Continuous (stationary and portable oxygen unit needed)   Oxygen conserving device Yes   Oxygen delivery system Gas      11/12/21 1059            Procedures/Studies: CT CHEST WO CONTRAST  Result Date: 10/30/2021 CLINICAL DATA:  Pneumonia, complication suspected. Respiratory distress, hypoxia, tachypnea. EXAM: CT CHEST WITHOUT CONTRAST TECHNIQUE: Multidetector CT imaging of the chest was performed following the standard protocol without IV contrast.  RADIATION DOSE REDUCTION: This exam was performed according to the departmental dose-optimization program which includes automated exposure control, adjustment of the mA and/or kV according to patient size and/or use of iterative reconstruction technique. COMPARISON:  10/28/2021 FINDINGS: Cardiovascular: Mild aortic and coronary artery calcifications. No aortic aneurysm. Normal heart size. No pericardial effusions. Mediastinum/Nodes:  Thyroid gland is unremarkable. Scattered mediastinal lymph nodes are not pathologically enlarged. Esophagus is decompressed. Lungs/Pleura: New development of bilateral basilar atelectasis or consolidation. No pleural effusions. No pneumothorax. Airways are patent. Upper Abdomen: Inflammatory changes again demonstrated in the visualized pancreas although incompletely included. There is hazy stranding around the pancreatic head and body with soft tissue gas. The gas collections are new since prior study and may represent necrotizing infection or fistula. CT abdomen and pelvis suggested for further evaluation. Musculoskeletal: Degenerative changes in the spine. IMPRESSION: 1. New development of bilateral basilar consolidation or atelectasis in the lungs. 2. Incomplete visualization of changes of pancreatitis in the upper abdomen. New finding of peripancreatic gas suggesting necrotizing infection or fistula. CT abdomen and pelvis suggested for further evaluation. 3. Aortic atherosclerosis. Electronically Signed   By: Lucienne Capers M.D.   On: 10/30/2021 20:00   US RENAL  Result Date: 10/29/2021 CLINICAL DATA:  Acute kidney injury EXAM: RENAL / URINARY TRACT ULTRASOUND COMPLETE COMPARISON:  CT October 28, 2021 FINDINGS: Right Kidney: Renal measurements: 11.8 x 6.1 x 5.5 cm = volume: 207 mL. Echogenicity within normal limits. No mass or hydronephrosis visualized. Left Kidney: Renal measurements: 12.7 x 6.6 x 6.3 cm = volume: 275 mL. Echogenicity within normal limits. No mass or  hydronephrosis visualized. Bladder: Not visualized. Other: None. IMPRESSION: No hydronephrosis. Electronically Signed   By: Dahlia Bailiff M.D.   On: 10/29/2021 11:46   CT ABDOMEN LIMITED WO CONTRAST  Result Date: 11/01/2021 CLINICAL DATA:  Necrotizing pancreatitis, foci of gas in pancreatic bed and in anterior pararenal spaces bilaterally greater on RIGHT, question due to necrotizing pancreatitis versus bowel/duodenal perforation EXAM: CT ABDOMEN WITHOUT CONTRAST LIMITED TECHNIQUE: Multidetector CT imaging of the abdomen was performed following the standard protocol without IV contrast. Patient drank water-soluble contrast for this exam. RADIATION DOSE REDUCTION: This exam was performed according to the departmental dose-optimization program which includes automated exposure control, adjustment of the mA and/or kV according to patient size and/or use of iterative reconstruction technique. COMPARISON:  Earlier study 11/01/2021 FINDINGS: Lower chest: Bibasilar pleural effusions and atelectasis Hepatobiliary: Gallbladder and liver normal appearance Pancreas: Significantly enlarged and edematous pancreas with diffuse infiltration of peripancreatic fat planes consistent with acute pancreatitis. Few foci of gas again seen at the pancreatic head/body region. No abnormal fluid collections. No mass or definite hemorrhage. Spleen: Normal appearance Adrenals/Urinary Tract: Adrenal glands, kidneys, and proximal ureters unremarkable. Stomach/Bowel: Contrast opacifies the stomach, duodenal bulb, proximal descending duodenum, and third portion of duodenum as well as multiple jejunal loops. Bowel wall thickening of second and third portions of duodenum. No contrast extravasation is identified into the peripancreatic tissue planes or the anterior pararenal space to suggest duodenal perforation/ulcer. Diverticulosis of descending and distal transverse colon noted. Vascular/Lymphatic: Atherosclerotic calcifications aorta. No  adenopathy. Other: No free intraperitoneal air.  No hernia. Musculoskeletal: Osseous structures unremarkable. IMPRESSION: No extravasation of GI contrast into pancreatic bed or anterior pararenal spaces to suggest duodenal perforation. Persistent visualization of edema and foci of gas at the pancreatic head/body and in the anterior pararenal spaces bilaterally greater on RIGHT, likely reflecting necrotizing pancreatitis. Severe pancreatitis changes again seen. Electronically Signed   By: Lavonia Dana M.D.   On: 11/01/2021 16:38   DG CHEST PORT 1 VIEW  Result Date: 10/30/2021 CLINICAL DATA:  Hypoxia EXAM: PORTABLE CHEST 1 VIEW COMPARISON:  10/29/2021 FINDINGS: Bibasilar opacities, likely atelectasis. Suspect small effusions. Heart is normal size. No acute bony abnormality. IMPRESSION: Small bilateral effusions with bibasilar atelectasis. Electronically  Signed   By: Rolm Baptise M.D.   On: 10/30/2021 01:04   DG Chest Port 1 View  Result Date: 10/29/2021 CLINICAL DATA:  Abdominal pain and shortness of breath, pancreatitis EXAM: PORTABLE CHEST 1 VIEW COMPARISON:  Portable exam 0914 hours compared to 10/27/2021 FINDINGS: Normal heart size, mediastinal contours, and pulmonary vascularity. Bibasilar atelectasis. Remaining lungs clear. No definite pleural effusion or pneumothorax. IMPRESSION: Bibasilar atelectasis. Electronically Signed   By: Lavonia Dana M.D.   On: 10/29/2021 09:23   DG Chest Port 1 View  Result Date: 10/27/2021 CLINICAL DATA:  Epigastric pain. EXAM: PORTABLE CHEST 1 VIEW COMPARISON:  08/17/2009 FINDINGS: Shallow inspiration with atelectasis in the lung bases. Heart size and pulmonary vascularity are normal. No airspace disease or consolidation. Mediastinal contours appear intact. IMPRESSION: Shallow inspiration with atelectasis in the bases. Electronically Signed   By: Lucienne Capers M.D.   On: 10/27/2021 23:34   DG Abd Portable 1V  Result Date: 10/28/2021 CLINICAL DATA:  Abdominal pain. EXAM:  PORTABLE ABDOMEN - 1 VIEW COMPARISON:  CTA chest abdomen pelvis 7 hours ago. FINDINGS: No gaseous bowel dilatation to suggest obstruction. Contrast excretion noted both kidneys with accumulation of contrast in the bladder compatible with CT performed earlier today. Visualized bony anatomy shows no acute finding. IMPRESSION: Negative. Electronically Signed   By: Misty Stanley M.D.   On: 10/28/2021 08:57   ECHOCARDIOGRAM COMPLETE  Result Date: 10/31/2021    ECHOCARDIOGRAM REPORT   Patient Name:   CAYLOR TALLARICO Date of Exam: 10/31/2021 Medical Rec #:  [id number]      Height:       70.0 in Accession #:    [id number]     Weight:       273.1 lb Date of Birth:  [birth year]     BSA:          2.383 m Patient Age:    89 years       BP:           118/62 mmHg Patient Gender: M              HR:           113 bpm. Exam Location:  [facility] Procedure: 2D Echo, Cardiac Doppler and Color Doppler Indications:    Dyspnea  History:        Patient has prior history of Echocardiogram examinations, most                 recent 06/09/2014. Arrythmias:Atrial Fibrillation and Atrial                 Flutter; Risk Factors:Hypertension, Dyslipidemia and Former                 Smoker.  Sonographer:    Wenda Low Referring Phys: [id number] Charlesetta Ivory GONFA  Sonographer Comments: Patient is morbidly obese. Image acquisition challenging due to respiratory motion. Patient is on Bi-Pap IMPRESSIONS  1. Left ventricular ejection fraction, by estimation, is 60 to 65%. The left ventricle has normal function. The left ventricle has no regional wall motion abnormalities. There is moderate left ventricular hypertrophy. Left ventricular diastolic parameters are indeterminate.  2. Right ventricular systolic function is normal. The right ventricular size is normal. There is normal pulmonary artery systolic pressure.  3. Left atrial size was mildly dilated.  4. Prominent epicardial adipose tissue.  5. The mitral valve is normal in structure. No evidence of  mitral valve regurgitation. No evidence of mitral stenosis.  6. The aortic valve is tricuspid. There is mild calcification of the aortic valve. Aortic valve regurgitation is not visualized. Aortic valve sclerosis is present, with no evidence of aortic valve stenosis.  7. The inferior vena cava is normal in size with greater than 50% respiratory variability, suggesting right atrial pressure of 3 mmHg. FINDINGS  Left Ventricle: Left ventricular ejection fraction, by estimation, is 60 to 65%. The left ventricle has normal function. The left ventricle has no regional wall motion abnormalities. The left ventricular internal cavity size was normal in size. There is  moderate left ventricular hypertrophy. Left ventricular diastolic parameters are indeterminate. Right Ventricle: The right ventricular size is normal. No increase in right ventricular wall thickness. Right ventricular systolic function is normal. There is normal pulmonary artery systolic pressure. The tricuspid regurgitant velocity is 2.36 m/s, and  with an assumed right atrial pressure of 3 mmHg, the estimated right ventricular systolic pressure is 67.8 mmHg. Left Atrium: Left atrial size was mildly dilated. Right Atrium: Right atrial size was normal in size. Pericardium: Prominent epicardial adipose tissue. There is no evidence of pericardial effusion. Mitral Valve: The mitral valve is normal in structure. No evidence of mitral valve regurgitation. No evidence of mitral valve stenosis. MV peak gradient, 5.5 mmHg. The mean mitral valve gradient is 2.0 mmHg. Tricuspid Valve: The tricuspid valve is normal in structure. Tricuspid valve regurgitation is not demonstrated. No evidence of tricuspid stenosis. Aortic Valve: The aortic valve is tricuspid. There is mild calcification of the aortic valve. Aortic valve regurgitation is not visualized. Aortic valve sclerosis is present, with no evidence of aortic valve stenosis. Aortic valve mean gradient measures 4.5  mmHg. Aortic valve peak gradient measures 10.3 mmHg. Aortic valve area, by VTI measures 2.73 cm. Pulmonic Valve: The pulmonic valve was normal in structure. Pulmonic valve regurgitation is not visualized. No evidence of pulmonic stenosis. Aorta: The aortic root is normal in size and structure. Venous: The inferior vena cava is normal in size with greater than 50% respiratory variability, suggesting right atrial pressure of 3 mmHg. IAS/Shunts: No atrial level shunt detected by color flow Doppler.  LEFT VENTRICLE PLAX 2D LVIDd:         3.50 cm   Diastology LVIDs:         2.10 cm   LV e' medial:    15.40 cm/s LV PW:         1.20 cm   LV E/e' medial:  7.5 LV IVS:        1.50 cm   LV e' lateral:   13.30 cm/s LVOT diam:     2.10 cm   LV E/e' lateral: 8.7 LV SV:         71 LV SV Index:   30 LVOT Area:     3.46 cm  RIGHT VENTRICLE RV Basal diam:  3.55 cm RV Mid diam:    2.70 cm RV S prime:     16.43 cm/s LEFT ATRIUM             Index        RIGHT ATRIUM           Index LA diam:        4.30 cm 1.80 cm/m   RA Area:     22.00 cm LA Vol (A2C):   54.0 ml 22.66 ml/m  RA Volume:   69.50 ml  29.17 ml/m LA Vol (A4C):   69.4 ml 29.12 ml/m LA Biplane Vol: 67.5 ml 28.33 ml/m  AORTIC VALVE                     PULMONIC VALVE AV Area (Vmax):    2.72 cm      PV Vmax:       1.08 m/s AV Area (Vmean):   2.67 cm      PV Peak grad:  4.7 mmHg AV Area (VTI):     2.73 cm AV Vmax:           160.50 cm/s AV Vmean:          100.300 cm/s AV VTI:            0.260 m AV Peak Grad:      10.3 mmHg AV Mean Grad:      4.5 mmHg LVOT Vmax:         126.00 cm/s LVOT Vmean:        77.200 cm/s LVOT VTI:          0.205 m LVOT/AV VTI ratio: 0.79  AORTA Ao Root diam: 3.20 cm MITRAL VALVE                TRICUSPID VALVE MV Area (PHT): 3.17 cm     TR Peak grad:   22.3 mmHg MV Area VTI:   3.48 cm     TR Vmax:        236.00 cm/s MV Peak grad:  5.5 mmHg MV Mean grad:  2.0 mmHg     SHUNTS MV Vmax:       1.17 m/s     Systemic VTI:  0.20 m MV Vmean:      65.2 cm/s     Systemic Diam: 2.10 cm MV Decel Time: 239 msec MV E velocity: 116.00 cm/s Jenkins Rouge MD Electronically signed by Jenkins Rouge MD Signature Date/Time: 10/31/2021/10:20:51 AM    Final    CT PANCREAS ABD W/WO  Result Date: 11/01/2021 CLINICAL DATA:  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 EXAM: CT ABDOMEN WITHOUT AND WITH CONTRAST TECHNIQUE: Multidetector CT imaging of the abdomen was performed following the standard protocol before and following the bolus administration of intravenous contrast. RADIATION DOSE REDUCTION: This exam was performed according to the departmental dose-optimization program which includes automated exposure control, adjustment of the mA and/or kV according to patient size and/or use of iterative reconstruction technique. CONTRAST:  170mL OMNIPAQUE IOHEXOL 300 MG/ML  SOLN COMPARISON:  Recent imaging from October 28, 2021. FINDINGS: Lower chest: Small bilateral effusions and basilar volume loss. Hepatobiliary: Hepatic steatosis with lobular hepatic contours. No focal, suspicious hepatic lesion. Hepatic arterial supply derive from the celiac axis and SMA with replaced RIGHT hepatic arterial supply arising from the SMA. Gallbladder with mild surrounding stranding felt to be secondary from pancreatic and retroperitoneal process. The portal vein or remains patent as does the splenic vein. Pancreas: Marked interstitial edematous pancreatitis. Note that the pancreatic parenchyma is poorly enhancing but does enhance, scattered areas of non enhancement are noted. There is extensive surrounding inflammation. No large hematoma. There is however gas that has developed in the LEFT and RIGHT retroperitoneum greatest on the RIGHT. Gas along the celiac and about the neck of the pancreas largely sparing the pancreas proper is new compared to February 5th and incompletely imaged on the recent comparison evaluation. Spleen: Normal. Adrenals/Urinary Tract: Adrenal glands are normal. Symmetric renal enhancement  without hydronephrosis. Stomach/Bowel: Duodenal inflammation persists with surrounding stranding further extending into retroperitoneal fascial planes and associated with gas as described. No signs of pneumatosis. There  also very small locules of gas anterior to the duodenum (image 92/11). No pneumoperitoneum. Vascular/Lymphatic: Atherosclerotic changes of the abdominal aorta. No adenopathy in the retroperitoneum. Other: Retroperitoneal gas as described above. Greatest collection of gas along the RIGHT flank posterior to the colon measuring 5.0 x 2.9 cm. Extensive inflammation with worsening extending throughout retroperitoneal fascial planes. Extensive body wall edema. Musculoskeletal: No acute bone finding. No destructive bone process. Spinal degenerative changes. IMPRESSION: 1. 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 2. Dissemination of inflammation throughout the retroperitoneum with gas greater on the RIGHT than the LEFT. Findings are suspicious for infection of peripancreatic necrosis, given rapid development aggressive or necrotizing infection is considered and should be correlated with the patient's clinical condition. 3. Given small locules of gas adjacent to the duodenum and the presence of gas in the retroperitoneum would consider follow-up imaging with enteric contrast media utilizing CT to exclude the possibility of a GI source of gas from bowel compromise particularly of the duodenum. 4. Hepatic steatosis with lobular hepatic contours. 5. Small bilateral effusions and basilar volume loss. 6. Aortic atherosclerosis. Aortic Atherosclerosis (ICD10-I70.0). These results will be called to the ordering clinician or representative by the Radiologist Assistant, and communication documented in the PACS or [employer]. Electronically Signed   By: Zetta Bills M.D.   On: 11/01/2021 12:11   CT Angio Chest/Abd/Pel for Dissection W  and/or Wo Contrast  Result Date: 10/28/2021 CLINICAL DATA:  Dyspnea, epigastric abdominal pain EXAM: CT ANGIOGRAPHY CHEST, ABDOMEN AND PELVIS TECHNIQUE: Non-contrast CT of the chest was initially obtained. Multidetector CT imaging through the chest, abdomen and pelvis was performed using the standard protocol during bolus administration of intravenous contrast. Multiplanar reconstructed images and MIPs were obtained and reviewed to evaluate the vascular anatomy. RADIATION DOSE REDUCTION: This exam was performed according to the departmental dose-optimization program which includes automated exposure control, adjustment of the mA and/or kV according to patient size and/or use of iterative reconstruction technique. CONTRAST:  176mL OMNIPAQUE IOHEXOL 350 MG/ML SOLN COMPARISON:  None. FINDINGS: CTA CHEST FINDINGS Cardiovascular: The thoracic aorta is normal in course and caliber; no intramural hematoma, dissection, or aneurysm. Minimal atherosclerotic calcification. No significant coronary artery calcification. Global cardiac size within normal limits. No pericardial effusion. Central pulmonary arteries are of normal caliber. Mediastinum/Nodes: Visualized thyroid is unremarkable. No pathologic thoracic adenopathy. The esophagus is fluid-filled, likely reflecting the sequela of gastroesophageal reflux given gastric distension. Lungs/Pleura: Mild bilateral dependent atelectasis. The lungs are otherwise clear. No pneumothorax or pleural effusion. Musculoskeletal: No acute bone abnormality Review of the MIP images confirms the above findings. CTA ABDOMEN AND PELVIS FINDINGS VASCULAR Aorta: Normal caliber. No aneurysm or dissection. Mild atherosclerotic calcification. No periaortic inflammatory change. Celiac: Unremarkable SMA: Replaced right hepatic artery.  Otherwise unremarkable. Renals: Dual renal arteries are noted bilaterally. Atherosclerotic calcification results in less than 50% stenoses of the dominant renal  arteries at their origins bilaterally. Normal vascular morphology. No aneurysm or dissection. IMA: Widely patent. Inflow: Widely patent. No aneurysm or dissection. Internal iliac arteries are patent bilaterally. Veins: No obvious venous abnormality within the limitations of this arterial phase study. Review of the MIP images confirms the above findings. NON-VASCULAR Hepatobiliary: No focal liver abnormality is seen. No gallstones, gallbladder wall thickening, or biliary dilatation. Pancreas: There is extensive peripancreatic inflammatory stranding with inflammatory fluid tracking into the mesenteric root and into the right anterior pararenal space. Normal enhancement of the pancreatic parenchyma. No loculated intrapancreatic or  peripancreatic fluid collections. The pancreatic duct is not dilated. Altogether, the findings are in keeping with changes of acute interstitial/edematous pancreatitis. Spleen: Unremarkable. Adrenals/Urinary Tract: Adrenal glands are unremarkable. Kidneys are normal, without renal calculi, focal lesion, or hydronephrosis. Bladder is unremarkable. Stomach/Bowel: The stomach is distended and fluid filled suggesting changes of gastric outlet obstruction. There is hyperemia of the duodenum as well as extensive periduodenal inflammatory stranding likely related to the adjacent inflammatory process involving the pancreas. The small bowel is unremarkable. There is extensive distal transverse, descending, and sigmoid colonic diverticulosis as well as ascending colonic diverticulosis. No superimposed acute inflammatory change. Appendix normal. No free intraperitoneal gas or fluid. Lymphatic: No pathologic adenopathy. Reproductive: Prostate is unremarkable. Other: Small bilateral fat containing inguinal hernias and umbilical hernia are identified. Musculoskeletal: Degenerative changes are seen within the lumbar spine. No lytic or blastic bone lesion. No acute bone abnormality. Review of the MIP images  confirms the above findings. IMPRESSION: No evidence of aortic dissection or aneurysm. Mild atherosclerotic calcification. No evidence of hemodynamically significant stenosis involving the visceral arterial vasculature or lower extremity arterial inflow. Findings in keeping with acute interstitial/edematous pancreatitis. No evidence of pancreatic or peripancreatic necrosis at this time. Duodenal hyperemia likely related to the adjacent inflammatory process. Fluid distension of the stomach likely related to gastric outlet obstruction secondary to the adjacent inflammatory process. Fluid within the distal esophagus in keeping with gastroesophageal reflux. Aortic Atherosclerosis (ICD10-I70.0). Electronically Signed   By: Fidela Salisbury M.D.   On: 10/28/2021 01:31   US Abdomen Limited RUQ (LIVER/GB)  Result Date: 10/28/2021 CLINICAL DATA:  Acute pancreatitis. EXAM: ULTRASOUND ABDOMEN LIMITED RIGHT UPPER QUADRANT COMPARISON:  CT 10/28/2021 FINDINGS: Gallbladder: No gallstones or wall thickening visualized. No sonographic Murphy sign noted by sonographer. Common bile duct: Diameter: 5.5 mm.  No intrahepatic bile duct dilatation. Liver: The liver has a heterogeneous echotexture and slightly irregular contour. No focal liver abnormality identified. Trace perihepatic ascites portal vein is patent on color Doppler imaging with normal direction of blood flow towards the liver. Other: None. IMPRESSION: 1. No gallstones or signs of acute cholecystitis. 2. Echogenic liver compatible with hepatic steatosis 3. Trace perihepatic free fluid Electronically Signed   By: Kerby Moors M.D.   On: 10/28/2021 09:41     Subjective: Pt reports he is feeling much better and happy to be going home.    Discharge Exam: Vitals:   11/11/21 1232 11/11/21 2124  BP: 125/83 (!) 146/91  Pulse: 64 87  Resp: 20 18  Temp: 98.4 F (36.9 C) 98.4 F (36.9 C)  SpO2: 91% 95%   Vitals:   11/11/21 0500 11/11/21 1232 11/11/21 2124 11/12/21  0609  BP:  125/83 (!) 146/91   Pulse:  64 87   Resp:  20 18   Temp:  98.4 F (36.9 C) 98.4 F (36.9 C)   TempSrc:  Oral Oral   SpO2:  91% 95%   Weight: 120 kg   116.6 kg  Height:       General: morbidly obese male appears comfortable, A/O x4, No acute respiratory distress.  Eyes: PERRL sclera white ENT: MMM Neck:  supple, no JVD Lungs: BBS Clear to auscultation bilaterally without wheezes or crackles. No increased work of breathing.  Cardiovascular: Regular rate and rhythm without murmur gallop or rub normal S1 and S2 Abdomen: OBESE, negative abdominal pain, nondistended, positive soft, bowel sounds, no rebound, no ascites, no appreciable mass Extremities: No significant cyanosis, clubbing, or edema bilateral lower extremities.  Skin: Negative  rashes, lesions, ulcers.  Psychiatric:  Negative depression, negative anxiety, negative fatigue, negative mania  Central nervous system:  Cranial nerves II through XII intact, tongue/uvula midline, all extremities muscle strength 5/5, sensation intact throughout, negative dysarthria, negative expressive aphasia, negative receptive aphasia.   The results of significant diagnostics from this hospitalization (including imaging, microbiology, ancillary and laboratory) are listed below for reference.     Microbiology: No results found for this or any previous visit (from the past 240 hour(s)).   Labs: BNP (last 3 results) Recent Labs    10/29/21 0800 10/30/21 0111 10/30/21 0602  BNP 316.0* 80.0 74.1   Basic Metabolic Panel: Recent Labs  Lab 11/07/21 0729 11/08/21 0423 11/09/21 0355 11/10/21 0548 11/11/21 0408  NA 130* 132* 133* 134* 135  K 3.5 3.2* 3.6 4.1 3.9  CL 101 97* 99 100 98  CO2 23 21* 23 22 29   GLUCOSE 153* 146* 146* 144* 148*  BUN 15 13 13 12 14   CREATININE 0.68 0.69 0.68 0.70 0.82  CALCIUM 7.7* 7.8* 8.0* 8.5* 8.5*  MG 1.7 1.7 2.0 1.9 2.0  PHOS 3.8 3.2 3.3 3.2 4.5   Liver Function Tests: Recent Labs  Lab  11/07/21 0729 11/08/21 0423 11/09/21 0355 11/10/21 0548 11/11/21 0408  AST 38 35 47* 45* 46*  ALT 27 24 30 30 31   ALKPHOS 63 55 62 60 63  BILITOT 0.7 0.9 0.7 0.9 0.8  PROT 5.0* 4.9* 5.4* 5.8* 6.0*  ALBUMIN 1.9* 2.3* 2.4* 2.8* 2.8*   Recent Labs  Lab 11/07/21 0729 11/08/21 0423 11/09/21 0355 11/10/21 0548 11/11/21 0408  LIPASE 39 42 49 47 51   No results for input(s): AMMONIA in the last 168 hours. CBC: Recent Labs  Lab 11/06/21 0444 11/08/21 0423 11/09/21 0355 11/10/21 0548 11/11/21 0408  WBC 19.2* 16.7* 15.4* 14.4* 13.1*  NEUTROABS  --  13.1* 12.0* 11.2* 9.8*  HGB 12.7* 11.8* 11.9* 11.7* 12.4*  HCT 37.4* 35.6* 37.1* 36.5* 37.2*  MCV 96.4 97.0 95.9 95.5 97.4  PLT 374 506* 549* 547* 579*   Cardiac Enzymes: No results for input(s): CKTOTAL, CKMB, CKMBINDEX, TROPONINI in the last 168 hours. BNP: Invalid input(s): POCBNP CBG: No results for input(s): GLUCAP in the last 168 hours. D-Dimer No results for input(s): DDIMER in the last 72 hours. Hgb A1c No results for input(s): HGBA1C in the last 72 hours. Lipid Profile No results for input(s): CHOL, HDL, LDLCALC, TRIG, CHOLHDL, LDLDIRECT in the last 72 hours. Thyroid function studies No results for input(s): TSH, T4TOTAL, T3FREE, THYROIDAB in the last 72 hours.  Invalid input(s): FREET3 Anemia work up No results for input(s): VITAMINB12, FOLATE, FERRITIN, TIBC, IRON, RETICCTPCT in the last 72 hours. Urinalysis    Component Value Date/Time   COLORURINE YELLOW 05/22/2021 1707   APPEARANCEUR HAZY (A) 05/22/2021 1707   LABSPEC 1.017 05/22/2021 1707   PHURINE 5.0 05/22/2021 1707   GLUCOSEU NEGATIVE 05/22/2021 1707   HGBUR NEGATIVE 05/22/2021 1707   BILIRUBINUR NEGATIVE 05/22/2021 1707   KETONESUR NEGATIVE 05/22/2021 1707   PROTEINUR NEGATIVE 05/22/2021 1707   NITRITE NEGATIVE 05/22/2021 1707   LEUKOCYTESUR NEGATIVE 05/22/2021 1707   Sepsis Labs Invalid input(s): PROCALCITONIN,  WBC,   LACTICIDVEN Microbiology No results found for this or any previous visit (from the past 240 hour(s)).  Time coordinating discharge: 60 mins  SIGNED:  Irwin Brakeman, MD  [facility] 11/12/2021, 11:35 AM How to contact the [facility] Attending or Consulting provider [place] or covering provider during after hours [place],  for this patient?  Check the care team in [facility] and look for a) attending/consulting [facility] provider listed and b) the [facility] team listed Log into [email or web address] and use [place]'s universal password to access. If you do not have the password, please contact the hospital operator. Locate the [facility] provider you are looking for under [facility] and page to a number that you can be directly reached. If you still have difficulty reaching the provider, please page the [facility] (Director on Call) for the Hospitalists listed on amion for assistance.

## 2021-11-12 NOTE — Progress Notes (Signed)
SATURATION QUALIFICATIONS: (This note is used to comply with regulatory documentation for home oxygen)  Patient Saturations on Room Air at Rest = 98%  Patient Saturations on Room Air while Ambulating = 96%  Please briefly explain why patient needs home oxygen:

## 2021-11-14 DIAGNOSIS — I480 Paroxysmal atrial fibrillation: Secondary | ICD-10-CM | POA: Diagnosis not present

## 2021-11-16 ENCOUNTER — Inpatient Hospital Stay (HOSPITAL_COMMUNITY)
Admission: EM | Admit: 2021-11-16 | Discharge: 2021-11-19 | DRG: 377 | Disposition: A | Discharge status: Home Health | Payer: PPO | Attending: Internal Medicine | Admitting: Internal Medicine

## 2021-11-16 ENCOUNTER — Encounter (HOSPITAL_COMMUNITY): Payer: Self-pay | Admitting: Emergency Medicine

## 2021-11-16 ENCOUNTER — Other Ambulatory Visit: Payer: Self-pay

## 2021-11-16 DIAGNOSIS — T859XXA Unspecified complication of internal prosthetic device, implant and graft, initial encounter: Secondary | ICD-10-CM | POA: Diagnosis not present

## 2021-11-16 DIAGNOSIS — K264 Chronic or unspecified duodenal ulcer with hemorrhage: Secondary | ICD-10-CM | POA: Diagnosis not present

## 2021-11-16 DIAGNOSIS — Z823 Family history of stroke: Secondary | ICD-10-CM

## 2021-11-16 DIAGNOSIS — K8591 Acute pancreatitis with uninfected necrosis, unspecified: Secondary | ICD-10-CM | POA: Diagnosis present

## 2021-11-16 DIAGNOSIS — Z20822 Contact with and (suspected) exposure to covid-19: Secondary | ICD-10-CM | POA: Diagnosis not present

## 2021-11-16 DIAGNOSIS — Z83438 Family history of other disorder of lipoprotein metabolism and other lipidemia: Secondary | ICD-10-CM | POA: Diagnosis not present

## 2021-11-16 DIAGNOSIS — Z87891 Personal history of nicotine dependence: Secondary | ICD-10-CM

## 2021-11-16 DIAGNOSIS — Z8249 Family history of ischemic heart disease and other diseases of the circulatory system: Secondary | ICD-10-CM

## 2021-11-16 DIAGNOSIS — I480 Paroxysmal atrial fibrillation: Secondary | ICD-10-CM | POA: Diagnosis not present

## 2021-11-16 DIAGNOSIS — K2981 Duodenitis with bleeding: Secondary | ICD-10-CM | POA: Diagnosis not present

## 2021-11-16 DIAGNOSIS — I10 Essential (primary) hypertension: Secondary | ICD-10-CM | POA: Diagnosis present

## 2021-11-16 DIAGNOSIS — K315 Obstruction of duodenum: Secondary | ICD-10-CM | POA: Diagnosis not present

## 2021-11-16 DIAGNOSIS — K2971 Gastritis, unspecified, with bleeding: Secondary | ICD-10-CM | POA: Diagnosis not present

## 2021-11-16 DIAGNOSIS — Z79899 Other long term (current) drug therapy: Secondary | ICD-10-CM

## 2021-11-16 DIAGNOSIS — K922 Gastrointestinal hemorrhage, unspecified: Secondary | ICD-10-CM | POA: Diagnosis not present

## 2021-11-16 DIAGNOSIS — K2970 Gastritis, unspecified, without bleeding: Secondary | ICD-10-CM | POA: Diagnosis present

## 2021-11-16 DIAGNOSIS — Z7901 Long term (current) use of anticoagulants: Secondary | ICD-10-CM

## 2021-11-16 DIAGNOSIS — Z8619 Personal history of other infectious and parasitic diseases: Secondary | ICD-10-CM

## 2021-11-16 DIAGNOSIS — G4730 Sleep apnea, unspecified: Secondary | ICD-10-CM | POA: Diagnosis present

## 2021-11-16 DIAGNOSIS — Z6836 Body mass index (BMI) 36.0-36.9, adult: Secondary | ICD-10-CM

## 2021-11-16 DIAGNOSIS — K259 Gastric ulcer, unspecified as acute or chronic, without hemorrhage or perforation: Secondary | ICD-10-CM | POA: Diagnosis not present

## 2021-11-16 DIAGNOSIS — D75838 Other thrombocytosis: Secondary | ICD-10-CM | POA: Diagnosis not present

## 2021-11-16 DIAGNOSIS — Z833 Family history of diabetes mellitus: Secondary | ICD-10-CM | POA: Diagnosis not present

## 2021-11-16 DIAGNOSIS — E669 Obesity, unspecified: Secondary | ICD-10-CM | POA: Diagnosis present

## 2021-11-16 DIAGNOSIS — D500 Iron deficiency anemia secondary to blood loss (chronic): Secondary | ICD-10-CM | POA: Diagnosis present

## 2021-11-16 DIAGNOSIS — K2289 Other specified disease of esophagus: Secondary | ICD-10-CM | POA: Diagnosis not present

## 2021-11-16 DIAGNOSIS — Z8371 Family history of colonic polyps: Secondary | ICD-10-CM | POA: Diagnosis not present

## 2021-11-16 DIAGNOSIS — Y848 Other medical procedures as the cause of abnormal reaction of the patient, or of later complication, without mention of misadventure at the time of the procedure: Secondary | ICD-10-CM | POA: Diagnosis not present

## 2021-11-16 DIAGNOSIS — B9681 Helicobacter pylori [H. pylori] as the cause of diseases classified elsewhere: Secondary | ICD-10-CM | POA: Diagnosis not present

## 2021-11-16 DIAGNOSIS — R066 Hiccough: Secondary | ICD-10-CM | POA: Diagnosis not present

## 2021-11-16 DIAGNOSIS — K8689 Other specified diseases of pancreas: Secondary | ICD-10-CM | POA: Diagnosis not present

## 2021-11-16 DIAGNOSIS — Z86010 Personal history of colonic polyps: Secondary | ICD-10-CM

## 2021-11-16 DIAGNOSIS — K920 Hematemesis: Secondary | ICD-10-CM | POA: Diagnosis not present

## 2021-11-16 DIAGNOSIS — K311 Adult hypertrophic pyloric stenosis: Secondary | ICD-10-CM | POA: Diagnosis not present

## 2021-11-16 DIAGNOSIS — I700 Atherosclerosis of aorta: Secondary | ICD-10-CM | POA: Diagnosis not present

## 2021-11-16 DIAGNOSIS — D72829 Elevated white blood cell count, unspecified: Secondary | ICD-10-CM | POA: Diagnosis present

## 2021-11-16 DIAGNOSIS — K219 Gastro-esophageal reflux disease without esophagitis: Secondary | ICD-10-CM | POA: Diagnosis present

## 2021-11-16 DIAGNOSIS — E782 Mixed hyperlipidemia: Secondary | ICD-10-CM | POA: Diagnosis not present

## 2021-11-16 DIAGNOSIS — K3189 Other diseases of stomach and duodenum: Secondary | ICD-10-CM | POA: Diagnosis not present

## 2021-11-16 DIAGNOSIS — I4891 Unspecified atrial fibrillation: Secondary | ICD-10-CM | POA: Diagnosis not present

## 2021-11-16 LAB — CBC WITH DIFFERENTIAL/PLATELET
Abs Immature Granulocytes: 0.17 10*3/uL — ABNORMAL HIGH (ref 0.00–0.07)
Basophils Absolute: 0.1 10*3/uL (ref 0.0–0.1)
Basophils Relative: 0 %
Eosinophils Absolute: 0 10*3/uL (ref 0.0–0.5)
Eosinophils Relative: 0 %
HCT: 42.1 % (ref 39.0–52.0)
Hemoglobin: 13.9 g/dL (ref 13.0–17.0)
Immature Granulocytes: 2 %
Lymphocytes Relative: 11 %
Lymphs Abs: 1.2 10*3/uL (ref 0.7–4.0)
MCH: 31.5 pg (ref 26.0–34.0)
MCHC: 33 g/dL (ref 30.0–36.0)
MCV: 95.5 fL (ref 80.0–100.0)
Monocytes Absolute: 1.1 10*3/uL — ABNORMAL HIGH (ref 0.1–1.0)
Monocytes Relative: 10 %
Neutro Abs: 9 10*3/uL — ABNORMAL HIGH (ref 1.7–7.7)
Neutrophils Relative %: 77 %
Platelets: 631 10*3/uL — ABNORMAL HIGH (ref 150–400)
RBC: 4.41 MIL/uL (ref 4.22–5.81)
RDW: 13.2 % (ref 11.5–15.5)
WBC: 11.6 10*3/uL — ABNORMAL HIGH (ref 4.0–10.5)
nRBC: 0 % (ref 0.0–0.2)

## 2021-11-16 LAB — COMPREHENSIVE METABOLIC PANEL
ALT: 24 U/L (ref 0–44)
AST: 28 U/L (ref 15–41)
Albumin: 3.3 g/dL — ABNORMAL LOW (ref 3.5–5.0)
Alkaline Phosphatase: 73 U/L (ref 38–126)
Anion gap: 13 (ref 5–15)
BUN: 14 mg/dL (ref 8–23)
CO2: 23 mmol/L (ref 22–32)
Calcium: 8.9 mg/dL (ref 8.9–10.3)
Chloride: 100 mmol/L (ref 98–111)
Creatinine, Ser: 0.83 mg/dL (ref 0.61–1.24)
GFR, Estimated: >60 mL/min (ref >60)
Glucose, Bld: 162 mg/dL — ABNORMAL HIGH (ref 70–99)
Potassium: 3.6 mmol/L (ref 3.5–5.1)
Sodium: 136 mmol/L (ref 135–145)
Total Bilirubin: 0.7 mg/dL (ref 0.3–1.2)
Total Protein: 7.3 g/dL (ref 6.5–8.1)

## 2021-11-16 LAB — TYPE AND SCREEN
ABO/RH(D): O POS
Antibody Screen: NEGATIVE

## 2021-11-16 LAB — RESP PANEL BY RT-PCR (FLU A&B, COVID) ARPGX2
Influenza A by PCR: NEGATIVE
Influenza B by PCR: NEGATIVE
SARS Coronavirus 2 by RT PCR: NEGATIVE

## 2021-11-16 LAB — OCCULT BLOOD GASTRIC / DUODENUM (SPECIMEN CUP)
Occult Blood, Gastric: POSITIVE — AB
pH, Gastric: 4

## 2021-11-16 LAB — LIPASE, BLOOD: Lipase: 40 U/L (ref 11–51)

## 2021-11-16 MED ORDER — LORAZEPAM 2 MG/ML IJ SOLN
1.0000 mg | Freq: Every day | INTRAMUSCULAR | Status: DC
  Administered 2021-11-16 – 2021-11-18 (×3): 1 mg via INTRAVENOUS
  Filled 2021-11-16 (×3): qty 1

## 2021-11-16 MED ORDER — ONDANSETRON HCL 4 MG/2ML IJ SOLN
4.0000 mg | Freq: Once | INTRAMUSCULAR | Status: AC
  Administered 2021-11-16: 4 mg via INTRAVENOUS
  Filled 2021-11-16: qty 2

## 2021-11-16 MED ORDER — LABETALOL HCL 5 MG/ML IV SOLN: 10.0000 mg | INTRAVENOUS | Status: DC | PRN

## 2021-11-16 MED ORDER — SODIUM CHLORIDE 0.9 % IV BOLUS
100.0000 mL | Freq: Once | INTRAVENOUS | Status: AC
  Administered 2021-11-16: 100 mL via INTRAVENOUS

## 2021-11-16 MED ORDER — POTASSIUM CHLORIDE IN NACL 20-0.9 MEQ/L-% IV SOLN: INTRAVENOUS | Status: DC

## 2021-11-16 MED ORDER — ACETAMINOPHEN 650 MG RE SUPP: 650.0000 mg | Freq: Four times a day (QID) | RECTAL | Status: DC | PRN

## 2021-11-16 MED ORDER — BRIMONIDINE TARTRATE 0.2 % OP SOLN
1.0000 [drp] | Freq: Two times a day (BID) | OPHTHALMIC | Status: DC
  Administered 2021-11-16 – 2021-11-19 (×5): 1 [drp] via OPHTHALMIC
  Filled 2021-11-16 (×3): qty 5

## 2021-11-16 MED ORDER — ONDANSETRON HCL 4 MG/2ML IJ SOLN
4.0000 mg | Freq: Four times a day (QID) | INTRAMUSCULAR | Status: DC | PRN
Start: 2021-11-16 — End: 2021-11-19

## 2021-11-16 MED ORDER — PANTOPRAZOLE SODIUM 40 MG IV SOLR
40.0000 mg | Freq: Two times a day (BID) | INTRAVENOUS | Status: DC
Start: 2021-11-16 — End: 2021-11-19
  Administered 2021-11-16 – 2021-11-19 (×7): 40 mg via INTRAVENOUS
  Filled 2021-11-16 (×7): qty 10

## 2021-11-16 MED ORDER — SODIUM CHLORIDE 0.9 % IV SOLN: INTRAVENOUS | Status: DC

## 2021-11-16 MED ORDER — ACETAMINOPHEN 325 MG PO TABS
650.0000 mg | ORAL_TABLET | Freq: Four times a day (QID) | ORAL | Status: DC | PRN
  Administered 2021-11-18: 650 mg via ORAL
  Filled 2021-11-16: qty 2

## 2021-11-16 MED ORDER — SODIUM CHLORIDE 0.9 % IV BOLUS
1000.0000 mL | Freq: Once | INTRAVENOUS | Status: AC
  Administered 2021-11-16: 1000 mL via INTRAVENOUS

## 2021-11-16 MED ORDER — ONDANSETRON HCL 4 MG PO TABS: 4.0000 mg | ORAL_TABLET | Freq: Four times a day (QID) | ORAL | Status: DC | PRN

## 2021-11-16 MED ORDER — METOPROLOL TARTRATE 5 MG/5ML IV SOLN
5.0000 mg | Freq: Four times a day (QID) | INTRAVENOUS | Status: DC
  Administered 2021-11-16 – 2021-11-17 (×3): 5 mg via INTRAVENOUS
  Filled 2021-11-16 (×4): qty 5

## 2021-11-16 NOTE — ED Notes (Signed)
Pt vomited after drinking water, made MD aware.

## 2021-11-16 NOTE — ED Provider Notes (Signed)
[facility] EMERGENCY DEPARTMENT Provider Note   CSN: [id number] Arrival date & time: 11/16/21  4174     History  Chief Complaint  Patient presents with   Emesis    Sean Golden is a 76 y.o. male.  HPI He is here for evaluation of vomiting which started yesterday evening described multiple times.  He describes the emesis as "looks like coffee grounds."  This is happened multiple times.  He was discharged from the hospital, 4 days ago after admission and treatment for pancreatitis.  He has not had chronic pancreatitis he is anticoagulated.    Home Medications Prior to Admission medications   Medication Sig Start Date End Date Taking? Authorizing Provider  acetaminophen (TYLENOL) 325 MG tablet Take 2 tablets (650 mg total) by mouth every 6 (six) hours as needed for mild pain, fever or headache (or Fever >/= 101). 11/12/21  Yes Johnson, Clanford L, MD  apixaban (ELIQUIS) 5 MG TABS tablet TAKE ONE TABLET (5MG  TOTAL) BY MOUTH TWOTIMES DAILY Patient taking differently: Take 5 mg by mouth 2 (two) times daily. 08/20/21  Yes Satira Sark, MD  brimonidine (ALPHAGAN) 0.2 % ophthalmic solution Place 1 drop into the right eye 2 (two) times daily. 10/18/21  Yes [provider]  diltiazem (CARDIZEM CD) 240 MG 24 hr capsule Take 1 capsule (240 mg total) by mouth daily. 11/12/21  Yes Johnson, Clanford L, MD  furosemide (LASIX) 40 MG tablet Take 1 tablet (40 mg total) by mouth every other day. 11/14/21 11/14/22 Yes Johnson, Clanford L, MD  pantoprazole (PROTONIX) 40 MG tablet Take 1 tablet (40 mg total) by mouth 2 (two) times daily. 11/12/21  Yes Johnson, Clanford L, MD  potassium chloride (KLOR-CON M) 10 MEQ tablet Take 1 tablet (10 mEq total) by mouth every other day. 11/14/21  Yes Johnson, Clanford L, MD  tamsulosin (FLOMAX) 0.4 MG CAPS capsule Take 2 capsules (0.8 mg total) by mouth every evening. 11/12/21  Yes Johnson, Clanford L, MD  polyethylene glycol (MIRALAX / GLYCOLAX) 17 g packet  Take 17 g by mouth daily. 11/12/21   Johnson, Clanford L, MD  traMADol (ULTRAM) 50 MG tablet Take 1 tablet (50 mg total) by mouth every 12 (twelve) hours as needed for severe pain. Patient not taking: Reported on 11/16/2021 11/12/21   Murlean Iba, MD      Allergies    Patient has no known allergies.    Review of Systems   Review of Systems  Physical Exam Updated Vital Signs BP (!) 144/104    Pulse 72    Temp 98.3 F (36.8 C)    Resp 18    Ht 5\' 10"  (1.778 m)    Wt 116.6 kg    SpO2 96%    BMI 36.88 kg/m  Physical Exam Vitals and nursing note reviewed.  Constitutional:      General: He is not in acute distress.    Appearance: He is well-developed. He is obese. He is not ill-appearing, toxic-appearing or diaphoretic.  HENT:     Head: Normocephalic and atraumatic.     Right Ear: External ear normal.     Left Ear: External ear normal.  Eyes:     Conjunctiva/sclera: Conjunctivae normal.     Pupils: Pupils are equal, round, and reactive to light.  Neck:     Trachea: Phonation normal.  Cardiovascular:     Rate and Rhythm: Normal rate and regular rhythm.     Heart sounds: Normal heart sounds.  Pulmonary:  Effort: Pulmonary effort is normal. No respiratory distress.     Breath sounds: Normal breath sounds. No stridor.  Abdominal:     General: There is no distension.     Palpations: Abdomen is soft. There is no mass.     Tenderness: There is abdominal tenderness (Upper abdomen, mild, bilateral). There is no guarding.  Musculoskeletal:        General: Normal range of motion.     Cervical back: Normal range of motion and neck supple.  Skin:    General: Skin is warm and dry.  Neurological:     Mental Status: He is alert and oriented to person, place, and time.     Cranial Nerves: No cranial nerve deficit.     Sensory: No sensory deficit.     Motor: No abnormal muscle tone.     Coordination: Coordination normal.  Psychiatric:        Mood and Affect: Mood normal.         Behavior: Behavior normal.        Thought Content: Thought content normal.        Judgment: Judgment normal.    ED Results / Procedures / Treatments   Labs (all labs ordered are listed, but only abnormal results are displayed) Labs Reviewed  COMPREHENSIVE METABOLIC PANEL - Abnormal; Notable for the following components:      Result Value   Glucose, Bld 162 (*)    Albumin 3.3 (*)    All other components within normal limits  CBC WITH DIFFERENTIAL/PLATELET - Abnormal; Notable for the following components:   WBC 11.6 (*)    Platelets 631 (*)    Neutro Abs 9.0 (*)    Monocytes Absolute 1.1 (*)    Abs Immature Granulocytes 0.17 (*)    All other components within normal limits  OCCULT BLOOD GASTRIC / DUODENUM (SPECIMEN CUP) - Abnormal; Notable for the following components:   Occult Blood, Gastric POSITIVE (*)    All other components within normal limits  RESP PANEL BY RT-PCR (FLU A&B, COVID) [id number]  LIPASE, BLOOD  TYPE AND SCREEN    EKG EKG Interpretation  Date/Time:  Friday November 16 2021 07:08:30 EST Ventricular Rate:  142 PR Interval:    QRS Duration: 100 QT Interval:  335 QTC Calculation: 508 R Axis:   -42 Text Interpretation: Atrial fibrillation Left axis deviation Low voltage, extremity and precordial leads Consider anterolateral infarct ST depression, probably rate related Prolonged QT interval Since last tracing rate faster (RBBB and left anterior fascicular block) QT has lengthened Otherwise no significant change Confirmed by Daleen Bo [phone]) on 11/16/2021 8:36:02 AM  Radiology No results found.  Procedures Procedures    Medications Ordered in ED Medications  sodium chloride 0.9 % bolus 100 mL (has no administration in time range)  0.9 %  sodium chloride infusion (has no administration in time range)  pantoprazole (PROTONIX) injection 40 mg (has no administration in time range)  sodium chloride 0.9 % bolus 1,000 mL (0 mLs Intravenous Stopped 11/16/21  0841)  ondansetron (ZOFRAN) injection 4 mg (4 mg Intravenous Given 11/16/21 0850)    ED Course/ Medical Decision Making/ A&P Clinical Course as of 11/16/21 1101  Fri Nov 16, 2021  1020 He continues to vomit a small amount of dark brownish material.  Gastroccult has been positive, from emesis this morning.  I have discussed case with GI, Dr. Melony Overly who will see the patient in the ED to assist with disposition recommendations.  I anticipate the  patient will be admitted. [EW]    Clinical Course User Index [EW] Daleen Bo, MD                           Medical Decision Making Patient presenting with coffee-ground emesis, for 1 day.  Recent prolonged hospitalization with necrotizing pancreatitis, pneumonia with hypoxia.  Etiology of pancreatitis not clear.  Patient known to drink wine.  Anticoagulated for paroxysmal atrial fibrillation.  Presents from home by private vehicle for evaluation.  Amount and/or Complexity of Data Reviewed Independent Historian:     Details: He is a cogent historian External Data Reviewed: labs, radiology, ECG and notes.    Details: Recent hospitalization, complicated, prolonged, with evaluation by gastroenterology.  He did not have endoscopy. Hospital course complicated for fluid overload, felt to be multifactorial and treated with Lasix.  Also treated for hiccups during the admission.  Patient with multiple comorbidities. Labs: ordered. ECG/medicine tests: ordered.    Details: Cardiac monitor-rapid atrial fibrillation Discussion of management or test interpretation with external provider(s): Consult gastroenterology, Dr. Laural Golden; we saw the patient together in the ED.  Patient has ongoing vomiting, and presenting with potential gastric outlet obstruction.  Dr. Laural Golden ordered a NG tube.  He plans on doing endoscopy, tomorrow, requesting that anticoagulant be held.  Contacted hospitalist to admit patient.  Risk Prescription drug management. Decision regarding  hospitalization. Minor surgery with no identified risk factors. Risk Details: Patient with persistent coffee-ground emesis, which was positive for blood.  He is hemodynamically stable with unchanged hemoglobin from baseline.  Previously concern was present for gastric outlet obstruction.  He likely has ulceration causing bleeding complicated by anticoagulated status.  He will require hospitalization for stabilization.  He likely needs endoscopy tomorrow, a minor surgery procedure which he appears to be stable to complete.  Hospitalist consulted to admit the patient.  Critical Care Total time providing critical care: 30-74 minutes  Hospital course complicated for fluid overload, felt to be multifactorial and treated with Lasix.  Also treated for hiccups during the admission.  Patient with multiple comorbidities.           Final Clinical Impression(s) / ED Diagnoses Final diagnoses:  Hematemesis, unspecified whether nausea present    Rx / DC Orders ED Discharge Orders     None         Daleen Bo, MD 11/16/21 1101

## 2021-11-16 NOTE — Progress Notes (Addendum)
Patient feels much better. Angie returns so far has been over 3 L and it does not include several ounces that he vomited while NG tube was being inserted. He only has had 150 mL of urine since admission.  Therefore will change IV rate to 150 mL/h and add KCl 20 mill equivalents per liter. For repeat labs in AM. Lorazepam 1 mg IV nightly. EGD in AM.

## 2021-11-16 NOTE — ED Notes (Signed)
NG 16 fr inserted to L nare, pt vomited large amounts of dark brown emesis with flecks of bright red blood during placement. Placement verified via auscultation, NG set to low intermittent suction, 150 ml in suction cannister after placement. Pt cleaned up and placed in new gown. HOB elevated, resting with eyes closed at this time. Wife at bedside.

## 2021-11-16 NOTE — Assessment & Plan Note (Signed)
PPI IV twice daily

## 2021-11-16 NOTE — Assessment & Plan Note (Addendum)
Holding Eliquis Monitor on telemetry for rate control Holding home diltiazem while n.p.o. Metoprolol IV every 6 hours

## 2021-11-16 NOTE — ED Notes (Signed)
Dr. Rehman at bedside. 

## 2021-11-16 NOTE — ED Notes (Signed)
Report given to Crystal, RN.

## 2021-11-16 NOTE — ED Triage Notes (Signed)
Pt to the ED with vomiting since 2000 last night.  The vomit is black in color.

## 2021-11-16 NOTE — ED Notes (Signed)
Pt given water for PO challenge 

## 2021-11-16 NOTE — Consult Note (Signed)
Referring Provider: Christ Kick, MD Primary Care Physician:  Celene Squibb, MD Primary Gastroenterologist:  Dr. Laural Golden  Reason for Consultation:    Coffee-ground emesis  HPI:   Patient is 76 year old Caucasian male who presented to emergency room this morning with history of coffee-ground emesis since last night. Patient was recently hospitalized for necrotizing pancreatitis and discharged on 11/12/2021 after 16-day hospitalization. Patient states he has been eating very little although 2 days ago he had toast and egg and had cereal yesterday.  He is barely been drinking protein shake and Ensure.  Yesterday he began to experience regurgitation and chest pressure and finally began vomiting around 8 PM.  He vomited multiple times.  Vomitus consisted of coffee-ground material.  No hematemesis.  He also has not experienced melena or rectal bleeding.  He noted fullness in epigastric region.  His wife states that he has been gradually feeling better.  He had been increasing his physical activity.  ER visit by home health care nurse this week and he was advised to do exercise to prevent muscle wasting.  He has been gradually losing weight.  His wife states yesterday was the first that she saw no edema to his ankles. Patient denies shortness of breath or chest pain.  He also denies fever or chills or night sweats.  He feels his abdomen is much less distended than it was when he left the hospital. Last Eliquis dose was yesterday morning.  In ER patient was evaluated and given a liter of normal saline.  Lab studies revealed normal serum lipase.  WBC is 11.6 with H&H of 13.9 and 42.1 and platelet count was 631 K.  Chemistry panel is pertinent for glucose of 162.  His BUN is 14 and creatinine 0.83 and AST 28 and ALT 24 with albumin of 3.3.  GI history is significant for GERD and history of H. pylori for which she was treated back in December 2015 when he had EGD.  He also had a colonoscopy around the same time  with removal of 3 polyps and these were tubular adenomas.  Patient has not returned for surveillance exam.  Past Medical History:  Diagnosis Date   Atrial fibrillation ([place]) SEP 2015   Essential hypertension    GERD (gastroesophageal reflux disease)    Helicobacter pylori gastritis Dec 2015   Treated with Pylera   Sleep apnea        Recent hospitalization for necrotizing pancreatitis presumed to be due to alcohol  Past Surgical History:  Procedure Laterality Date   CARDIAC ELECTROPHYSIOLOGY STUDY AND ABLATION  2010?   COLONOSCOPY  2005   SOLITARY RECTAL ULCER   COLONOSCOPY N/A 09/06/2014   Dr. Oneida Alar: three colon polyps, moderate diverticulosis throughout the entire examined colon   ESOPHAGOGASTRODUODENOSCOPY N/A 09/06/2014   Dr. Fields:moderate erosive gastritis, no Barrett's. +H.pylori gastritis, treated with Pylera   HEMORRHOID BANDING N/A 09/06/2014   NO BANDING PERFORMED. NO INTERNAL HEMORRHOIDS IDENTIFIED    UPPER GASTROINTESTINAL ENDOSCOPY  2005 [facility]   VASECTOMY      Prior to Admission medications   Medication Sig Start Date End Date Taking? Authorizing Provider  acetaminophen (TYLENOL) 325 MG tablet Take 2 tablets (650 mg total) by mouth every 6 (six) hours as needed for mild pain, fever or headache (or Fever >/= 101). 11/12/21  Yes Johnson, Clanford L, MD  apixaban (ELIQUIS) 5 MG TABS tablet TAKE ONE TABLET (5MG  TOTAL) BY MOUTH TWOTIMES DAILY Patient taking differently: Take 5 mg by mouth 2 (two) times  daily. 08/20/21  Yes Satira Sark, MD  brimonidine [facility]) 0.2 % ophthalmic solution Place 1 drop into the right eye 2 (two) times daily. 10/18/21  Yes [provider]  diltiazem (CARDIZEM CD) 240 MG 24 hr capsule Take 1 capsule (240 mg total) by mouth daily. 11/12/21  Yes Johnson, Clanford L, MD  furosemide (LASIX) 40 MG tablet Take 1 tablet (40 mg total) by mouth every other day. 11/14/21 11/14/22 Yes Johnson, Clanford L, MD  pantoprazole (PROTONIX) 40 MG  tablet Take 1 tablet (40 mg total) by mouth 2 (two) times daily. 11/12/21  Yes Johnson, Clanford L, MD  potassium chloride (KLOR-CON M) 10 MEQ tablet Take 1 tablet (10 mEq total) by mouth every other day. 11/14/21  Yes Johnson, Clanford L, MD  tamsulosin (FLOMAX) 0.4 MG CAPS capsule Take 2 capsules (0.8 mg total) by mouth every evening. 11/12/21  Yes Johnson, Clanford L, MD  polyethylene glycol (MIRALAX / GLYCOLAX) 17 g packet Take 17 g by mouth daily. 11/12/21   Johnson, Clanford L, MD  traMADol (ULTRAM) 50 MG tablet Take 1 tablet (50 mg total) by mouth every 12 (twelve) hours as needed for severe pain. Patient not taking: Reported on 11/16/2021 11/12/21   Murlean Iba, MD    No current facility-administered medications for this encounter.   Current Outpatient Medications  Medication Sig Dispense Refill   acetaminophen (TYLENOL) 325 MG tablet Take 2 tablets (650 mg total) by mouth every 6 (six) hours as needed for mild pain, fever or headache (or Fever >/= 101).     apixaban (ELIQUIS) 5 MG TABS tablet TAKE ONE TABLET (5MG  TOTAL) BY MOUTH TWOTIMES DAILY (Patient taking differently: Take 5 mg by mouth 2 (two) times daily.) 60 tablet 5   brimonidine (ALPHAGAN) 0.2 % ophthalmic solution Place 1 drop into the right eye 2 (two) times daily.     diltiazem (CARDIZEM CD) 240 MG 24 hr capsule Take 1 capsule (240 mg total) by mouth daily. 30 capsule 2   furosemide (LASIX) 40 MG tablet Take 1 tablet (40 mg total) by mouth every other day. 15 tablet 0   pantoprazole (PROTONIX) 40 MG tablet Take 1 tablet (40 mg total) by mouth 2 (two) times daily. 30 tablet 11   potassium chloride (KLOR-CON M) 10 MEQ tablet Take 1 tablet (10 mEq total) by mouth every other day. 15 tablet 0   tamsulosin (FLOMAX) 0.4 MG CAPS capsule Take 2 capsules (0.8 mg total) by mouth every evening. 60 capsule 2   polyethylene glycol (MIRALAX / GLYCOLAX) 17 g packet Take 17 g by mouth daily. 30 each 2   traMADol (ULTRAM) 50 MG tablet Take  1 tablet (50 mg total) by mouth every 12 (twelve) hours as needed for severe pain. (Patient not taking: Reported on 11/16/2021) 14 tablet 0    Allergies as of 11/16/2021   (No Known Allergies)    Family History  Problem Relation Age of Onset   Hypertension Mother    Heart attack Father    Hyperlipidemia Sister    Hyperlipidemia Brother    Colon polyps Brother    Hypertension Brother    Hypertension Sister    Diabetes Brother    Stroke Other    Diabetes Other    Hypertension Other    Hyperlipidemia Other    Colon cancer Neg Hx     Social History   Socioeconomic History   Marital status: Married    Spouse name: Not on file   Number  of children: Not on file   Years of education: Not on file   Highest education level: Not on file  Occupational History   Occupation: Full time  Tobacco Use   Smoking status: Former    Packs/day: 0.50    Years: 15.00    Pack years: 7.50    Types: Cigarettes    Start date: 05/28/1967    Quit date: 05/27/1984    Years since quitting: 37.4   Smokeless tobacco: Never  Vaping Use   Vaping Use: Never used  Substance and Sexual Activity   Alcohol use: Yes    Alcohol/week: 14.0 standard drinks    Types: 14 Glasses of wine per week    Comment: 1 or 2 glasses of wine at night   Drug use: No   Sexual activity: Not on file  Other Topics Concern   Not on file  Social History Narrative   Married-2 KIDS(AGE 17 AND 38).   No regular exercise   Social Determinants of Health   Financial Resource Strain: Not on file  Food Insecurity: Not on file  Transportation Needs: Not on file  Physical Activity: Not on file  Stress: Not on file  Social Connections: Not on file  Intimate Partner Violence: Not on file    Review of Systems: See HPI, otherwise normal ROS  Physical Exam: Temp:  [98.3 F (36.8 C)] 98.3 F (36.8 C) (02/24 0708) Pulse Rate:  [39-117] 72 (02/24 1000) Resp:  [15-42] 18 (02/24 1000) BP: (123-153)/(80-104) 144/104 (02/24  1000) SpO2:  [91 %-98 %] 96 % (02/24 1000) Weight:  [116.6 kg] 116.6 kg (02/24 0706)   Patient was seen in emergency room.  His wife Manus Gunning is at bedside. Patient is alert and in no acute distress.  He is intermittently regurgitating coffee-ground material and mucus. Conjunctiva is pink.  Sclera is nonicteric. Oropharyngeal mucosa is normal. No neck masses thyromegaly or lymphadenopathy noted. Cardiac exam with irregular rhythm normal S1 and S2.  No murmur or gallop noted. Auscultation lungs reveal vesicular breath sounds bilaterally. Abdomen is full.  Bowel sounds are normal.  On palpation abdomen is soft.  He has fullness and mild midepigastric tenderness.  No organomegaly or masses. No peripheral edema or clubbing noted.  Intake/Output from previous day: No intake/output data recorded. Intake/Output this shift: Total I/O In: 1000 [IV Piggyback:1000] Out: 200 [Emesis/NG output:200]  Lab Results: Recent Labs    11/16/21 0727  WBC 11.6*  HGB 13.9  HCT 42.1  PLT 631*   BMET Recent Labs    11/16/21 0727  NA 136  K 3.6  CL 100  CO2 23  GLUCOSE 162*  BUN 14  CREATININE 0.83  CALCIUM 8.9   LFT Recent Labs    11/16/21 0727  PROT 7.3  ALBUMIN 3.3*  AST 28  ALT 24  ALKPHOS 73  BILITOT 0.7    Assessment;  Patient is 76 year old Caucasian male who presents with coffee-ground emesis.  Patient was discharged 5 days ago after 16-day hospitalization for necrotizing pancreatitis.  He has history of GERD and symptoms been well controlled with PPI.  He has been treated for H. pylori gastritis in 2015.  Eradication documented with hydrogen breath test.  Patient is on Eliquis and last dose was yesterday morning. Given patient history of pancreatitis one has to be worried about pseudocyst impinging upon distal stomach or duodenum or he could also have peptic ulcer disease given recent acute illness.  He appears to be hemodynamically stable.  Expect drop  in H&H post hydration. I  suspect his stomach is full of coffee-ground material and he would benefit from NG tube placement.   Recommendations;  Hold Eliquis/apixaban. NG tube to low pressure intermittent suction. Continue IV fluids. Pantoprazole 40 mg IV every 12 hours. Diagnostic esophagogastroduodenoscopy possibly on 11/17/2021. Timing of follow-up pancreas protocol CT to be determined.   LOS: 0 days      11/16/2021, 10:48 AM

## 2021-11-16 NOTE — Assessment & Plan Note (Signed)
CPAP at bedtime Lifestyle changes outpatient

## 2021-11-16 NOTE — Assessment & Plan Note (Addendum)
Currently resolved and started on clear liquid diet Appreciate GI evaluation with upper endoscopy on 2/25 demonstrating nonbleeding duodenal ulcer H/H currently stable Monitor repeat CBC PPI IV twice daily Holding Eliquis for now Started on Reglan every 8 hours  IV fluid rate decreased Anticipate hopeful discharge on full liquid diet if possible

## 2021-11-16 NOTE — Progress Notes (Signed)
Pt arrived to room 335 via stretcher from ED. Pt ambulatory from stretcher to bed with standby assistance, gait steady, denies any lightheadedness or dizziness. Pt A&O x4. IV site  WNL, fluids infusing without s/s infiltration. NGT intact, placed to low wall suction draining large amount of black colored emesis. Safety procedures explained to pt and wife, both state understanding. Bed alarm on for safety, urinal at bedside.

## 2021-11-16 NOTE — Assessment & Plan Note (Signed)
Monitor closely with use of IV metoprolol Currently elevated and as needed labetalol will be ordered for severe elevations

## 2021-11-16 NOTE — Assessment & Plan Note (Signed)
This was a recent problem on prior admission Appreciate ongoing GI evaluation with possible CT evaluation needed this admission

## 2021-11-16 NOTE — H&P (Signed)
History and Physical    Patient: Sean Golden IFO:[id number] DOB: 1945-10-12 DOA: 11/16/2021 DOS: the patient was seen and examined on 11/16/2021 PCP: Celene Squibb, MD  Patient coming from: Home  Chief Complaint:  Chief Complaint  Patient presents with   Emesis    HPI: Sean Golden is a 76 y.o. male with medical history significant of history of afib on Eliquis, essential hypertension, GERD, H. pylori, sleep apnea, obesity, and more who was just discharged 5 days ago after a 16-day hospitalization for necrotizing pancreatitis.  He presented with coffee-ground emesis that began last night.  He states that he has been eating very little and is barely drinking any supplemental shakes.  He vomited multiple times and was noted to have coffee-ground material, but no frank blood.  He has not had any melena or rectal bleed and is noticing some fullness in his epigastric region.  He has been gradually losing weight as a result of his poor oral intake.  His last Eliquis dose was yesterday morning.  Patient denies any other symptoms of chest pain, shortness of breath, fevers, chills, night sweats, or diarrhea.  In the ED, he has been seen by GI and started on PPI IV twice daily and given IV fluid.  Recommendations were to place NG tube and plans are for endoscopy in a.m.  Hemoglobin levels appear stable at 13.9 and he has mild leukocytosis of 11.6.  Review of Systems: As mentioned in the history of present illness. All other systems reviewed and are negative. Past Medical History:  Diagnosis Date   Atrial fibrillation ([place]) SEP 2015   Essential hypertension    GERD (gastroesophageal reflux disease)    Helicobacter pylori gastritis Dec 2015   Treated with Pylera   Sleep apnea    Past Surgical History:  Procedure Laterality Date   CARDIAC ELECTROPHYSIOLOGY STUDY AND ABLATION  2010?   COLONOSCOPY  2005   SOLITARY RECTAL ULCER   COLONOSCOPY N/A 09/06/2014   Dr. Oneida Alar: three colon polyps,  moderate diverticulosis throughout the entire examined colon   ESOPHAGOGASTRODUODENOSCOPY N/A 09/06/2014   Dr. Fields:moderate erosive gastritis, no Barrett's. +H.pylori gastritis, treated with Pylera   HEMORRHOID BANDING N/A 09/06/2014   NO BANDING PERFORMED. NO INTERNAL HEMORRHOIDS IDENTIFIED    UPPER GASTROINTESTINAL ENDOSCOPY  2005 [facility]   VASECTOMY     Social History:  reports that he quit smoking about 37 years ago. His smoking use included cigarettes. He started smoking about 54 years ago. He has a 7.50 pack-year smoking history. He has never used smokeless tobacco. He reports current alcohol use of about 14.0 standard drinks per week. He reports that he does not use drugs.  No Known Allergies  Family History  Problem Relation Age of Onset   Hypertension Mother    Heart attack Father    Hyperlipidemia Sister    Hyperlipidemia Brother    Colon polyps Brother    Hypertension Brother    Hypertension Sister    Diabetes Brother    Stroke Other    Diabetes Other    Hypertension Other    Hyperlipidemia Other    Colon cancer Neg Hx     Prior to Admission medications   Medication Sig Start Date End Date Taking? Authorizing Provider  acetaminophen (TYLENOL) 325 MG tablet Take 2 tablets (650 mg total) by mouth every 6 (six) hours as needed for mild pain, fever or headache (or Fever >/= 101). 11/12/21  Yes Johnson, Clanford L, MD  apixaban [place])  5 MG TABS tablet TAKE ONE TABLET (5MG  TOTAL) BY MOUTH TWOTIMES DAILY Patient taking differently: Take 5 mg by mouth 2 (two) times daily. 08/20/21  Yes Satira Sark, MD  brimonidine (ALPHAGAN) 0.2 % ophthalmic solution Place 1 drop into the right eye 2 (two) times daily. 10/18/21  Yes [provider]  diltiazem (CARDIZEM CD) 240 MG 24 hr capsule Take 1 capsule (240 mg total) by mouth daily. 11/12/21  Yes Johnson, Clanford L, MD  furosemide (LASIX) 40 MG tablet Take 1 tablet (40 mg total) by mouth every other day. 11/14/21 11/14/22  Yes Johnson, Clanford L, MD  pantoprazole (PROTONIX) 40 MG tablet Take 1 tablet (40 mg total) by mouth 2 (two) times daily. 11/12/21  Yes Johnson, Clanford L, MD  potassium chloride (KLOR-CON M) 10 MEQ tablet Take 1 tablet (10 mEq total) by mouth every other day. 11/14/21  Yes Johnson, Clanford L, MD  tamsulosin (FLOMAX) 0.4 MG CAPS capsule Take 2 capsules (0.8 mg total) by mouth every evening. 11/12/21  Yes Johnson, Clanford L, MD  polyethylene glycol (MIRALAX / GLYCOLAX) 17 g packet Take 17 g by mouth daily. 11/12/21   Johnson, Clanford L, MD  traMADol (ULTRAM) 50 MG tablet Take 1 tablet (50 mg total) by mouth every 12 (twelve) hours as needed for severe pain. Patient not taking: Reported on 11/16/2021 11/12/21   Murlean Iba, MD    Physical Exam: Vitals:   11/16/21 0900 11/16/21 0915 11/16/21 0930 11/16/21 1000  BP: (!) 153/103  (!) 132/96 (!) 144/104  Pulse: 100 97 93 72  Resp: (!) 25 20 (!) 24 18  Temp:      SpO2: 95% 91% 98% 96%  Weight:      Height:       General: A/O x4, No acute respiratory distress.  Eyes: PERRL sclera white ENT: MMM Neck:  supple, no JVD Lungs: BBS Clear to auscultation bilaterally without wheezes or crackles. No increased work of breathing.  Cardiovascular: Regular rate and rhythm without murmur gallop or rub normal S1 and S2 Abdomen: OBESE, negative abdominal pain, nondistended, positive soft, bowel sounds, no rebound, no ascites, no appreciable mass; NG tube present with coffee-ground material Extremities: No significant cyanosis, clubbing, or edema bilateral lower extremities.  Skin: Negative rashes, lesions, ulcers.  Psychiatric:  Negative depression, negative anxiety, negative fatigue, negative mania  Central nervous system:  Cranial nerves II through XII intact, tongue/uvula midline, all extremities muscle strength 5/5, sensation intact throughout, negative dysarthria, negative expressive aphasia, negative receptive aphasia.  Data Reviewed:  There  are no new results to review at this time.  Assessment and Plan: * Hematemesis- (present on admission) Appreciate GI evaluation H/H currently stable Monitor repeat CBC Maintain NG tube to suction PPI IV twice daily Holding Eliquis for now Anticipate endoscopy 2/25  Obesity with sleep apnea- (present on admission) CPAP at bedtime Lifestyle changes outpatient  Acute necrotizing pancreatitis- (present on admission) This was a recent problem on prior admission Appreciate ongoing GI evaluation with possible CT evaluation needed this admission  Gastroesophageal reflux disease- (present on admission) PPI IV twice daily  Paroxysmal atrial fibrillation ([place])- (present on admission) Holding Eliquis Monitor on telemetry for rate control Holding home diltiazem while n.p.o. Metoprolol IV every 6 hours  Essential hypertension- (present on admission) Monitor closely with use of IV metoprolol Currently elevated and as needed labetalol will be ordered for severe elevations   Advance Care Planning: Full code  Consults: GI  Family Communication: Wife at bedside  Severity of Illness: The appropriate patient status for this patient is INPATIENT. Inpatient status is judged to be reasonable and necessary in order to provide the required intensity of service to ensure the patient's safety. The patient's presenting symptoms, physical exam findings, and initial radiographic and laboratory data in the context of their chronic comorbidities is felt to place them at high risk for further clinical deterioration. Furthermore, it is not anticipated that the patient will be medically stable for discharge from the hospital within 2 midnights of admission.   * I certify that at the point of admission it is my clinical judgment that the patient will require inpatient hospital care spanning beyond 2 midnights from the point of admission due to high intensity of service, high risk for further deterioration and  high frequency of surveillance required.*  Author: Rodena Goldmann, DO 11/16/2021 11:49 AM  For on call review [email or web address].

## 2021-11-17 ENCOUNTER — Inpatient Hospital Stay (HOSPITAL_COMMUNITY): Payer: PPO | Admitting: Anesthesiology

## 2021-11-17 ENCOUNTER — Inpatient Hospital Stay (HOSPITAL_COMMUNITY): Payer: PPO

## 2021-11-17 ENCOUNTER — Encounter (HOSPITAL_COMMUNITY)
Admission: EM | Disposition: A | Discharge status: Home Health | Payer: Self-pay | Source: Home / Self Care | Attending: Internal Medicine

## 2021-11-17 DIAGNOSIS — K259 Gastric ulcer, unspecified as acute or chronic, without hemorrhage or perforation: Secondary | ICD-10-CM

## 2021-11-17 DIAGNOSIS — K922 Gastrointestinal hemorrhage, unspecified: Secondary | ICD-10-CM | POA: Diagnosis not present

## 2021-11-17 DIAGNOSIS — K2289 Other specified disease of esophagus: Secondary | ICD-10-CM

## 2021-11-17 HISTORY — PX: ESOPHAGOGASTRODUODENOSCOPY (EGD) WITH PROPOFOL: SHX5813

## 2021-11-17 LAB — CBC
HCT: 37.5 % — ABNORMAL LOW (ref 39.0–52.0)
Hemoglobin: 11.9 g/dL — ABNORMAL LOW (ref 13.0–17.0)
MCH: 30.8 pg (ref 26.0–34.0)
MCHC: 31.7 g/dL (ref 30.0–36.0)
MCV: 97.2 fL (ref 80.0–100.0)
Platelets: 532 10*3/uL — ABNORMAL HIGH (ref 150–400)
RBC: 3.86 MIL/uL — ABNORMAL LOW (ref 4.22–5.81)
RDW: 13.2 % (ref 11.5–15.5)
WBC: 10.8 10*3/uL — ABNORMAL HIGH (ref 4.0–10.5)
nRBC: 0 % (ref 0.0–0.2)

## 2021-11-17 LAB — COMPREHENSIVE METABOLIC PANEL
ALT: 20 U/L (ref 0–44)
AST: 25 U/L (ref 15–41)
Albumin: 2.8 g/dL — ABNORMAL LOW (ref 3.5–5.0)
Alkaline Phosphatase: 58 U/L (ref 38–126)
Anion gap: 8 (ref 5–15)
BUN: 20 mg/dL (ref 8–23)
CO2: 25 mmol/L (ref 22–32)
Calcium: 8.5 mg/dL — ABNORMAL LOW (ref 8.9–10.3)
Chloride: 106 mmol/L (ref 98–111)
Creatinine, Ser: 0.91 mg/dL (ref 0.61–1.24)
GFR, Estimated: >60 mL/min (ref >60)
Glucose, Bld: 123 mg/dL — ABNORMAL HIGH (ref 70–99)
Potassium: 3.7 mmol/L (ref 3.5–5.1)
Sodium: 139 mmol/L (ref 135–145)
Total Bilirubin: 0.7 mg/dL (ref 0.3–1.2)
Total Protein: 6 g/dL — ABNORMAL LOW (ref 6.5–8.1)

## 2021-11-17 LAB — MAGNESIUM: Magnesium: 2.1 mg/dL (ref 1.7–2.4)

## 2021-11-17 SURGERY — ESOPHAGOGASTRODUODENOSCOPY (EGD) WITH PROPOFOL
Anesthesia: General

## 2021-11-17 MED ORDER — FENTANYL CITRATE (PF) 100 MCG/2ML IJ SOLN
INTRAMUSCULAR | Status: DC | PRN
  Administered 2021-11-17: 50 ug via INTRAVENOUS

## 2021-11-17 MED ORDER — LIDOCAINE HCL (PF) 2 % IJ SOLN
INTRAMUSCULAR | Status: AC
  Filled 2021-11-17: qty 5

## 2021-11-17 MED ORDER — METOCLOPRAMIDE HCL 5 MG/ML IJ SOLN
INTRAMUSCULAR | Status: AC
  Filled 2021-11-17: qty 2

## 2021-11-17 MED ORDER — METOPROLOL TARTRATE 5 MG/5ML IV SOLN
5.0000 mg | Freq: Four times a day (QID) | INTRAVENOUS | Status: DC
  Administered 2021-11-17 – 2021-11-19 (×9): 5 mg via INTRAVENOUS
  Filled 2021-11-17 (×8): qty 5

## 2021-11-17 MED ORDER — METOPROLOL TARTRATE 5 MG/5ML IV SOLN
5.0000 mg | Freq: Four times a day (QID) | INTRAVENOUS | Status: DC
  Administered 2021-11-17: 5 mg via INTRAVENOUS
  Filled 2021-11-17 (×2): qty 5

## 2021-11-17 MED ORDER — SUCCINYLCHOLINE CHLORIDE 200 MG/10ML IV SOSY
PREFILLED_SYRINGE | INTRAVENOUS | Status: AC
  Filled 2021-11-17: qty 10

## 2021-11-17 MED ORDER — PHENYLEPHRINE 40 MCG/ML (10ML) SYRINGE FOR IV PUSH (FOR BLOOD PRESSURE SUPPORT)
PREFILLED_SYRINGE | INTRAVENOUS | Status: DC | PRN
  Administered 2021-11-17: 120 ug via INTRAVENOUS
  Administered 2021-11-17: 80 ug via INTRAVENOUS
  Administered 2021-11-17: 200 ug via INTRAVENOUS
  Administered 2021-11-17 (×3): 80 ug via INTRAVENOUS
  Administered 2021-11-17 (×3): 120 ug via INTRAVENOUS

## 2021-11-17 MED ORDER — LACTATED RINGERS IV SOLN: INTRAVENOUS | Status: DC

## 2021-11-17 MED ORDER — LIDOCAINE HCL (CARDIAC) PF 100 MG/5ML IV SOSY
PREFILLED_SYRINGE | INTRAVENOUS | Status: DC | PRN
  Administered 2021-11-17: 100 mg via INTRATRACHEAL

## 2021-11-17 MED ORDER — SUCCINYLCHOLINE CHLORIDE 200 MG/10ML IV SOSY
PREFILLED_SYRINGE | INTRAVENOUS | Status: DC | PRN
  Administered 2021-11-17: 160 mg via INTRAVENOUS

## 2021-11-17 MED ORDER — METOCLOPRAMIDE HCL 5 MG/ML IJ SOLN
10.0000 mg | Freq: Three times a day (TID) | INTRAMUSCULAR | Status: DC
  Administered 2021-11-17 – 2021-11-19 (×7): 10 mg via INTRAVENOUS
  Filled 2021-11-17 (×7): qty 2

## 2021-11-17 MED ORDER — ONDANSETRON HCL 4 MG/2ML IJ SOLN
INTRAMUSCULAR | Status: AC
  Filled 2021-11-17: qty 2

## 2021-11-17 MED ORDER — FENTANYL CITRATE (PF) 100 MCG/2ML IJ SOLN
INTRAMUSCULAR | Status: AC
  Filled 2021-11-17: qty 2

## 2021-11-17 MED ORDER — PROPOFOL 10 MG/ML IV BOLUS
INTRAVENOUS | Status: AC
  Filled 2021-11-17: qty 20

## 2021-11-17 MED ORDER — ROCURONIUM BROMIDE 10 MG/ML (PF) SYRINGE
PREFILLED_SYRINGE | INTRAVENOUS | Status: AC
  Filled 2021-11-17: qty 10

## 2021-11-17 MED ORDER — ONDANSETRON HCL 4 MG/2ML IJ SOLN
INTRAMUSCULAR | Status: DC | PRN
  Administered 2021-11-17: 4 mg via INTRAVENOUS

## 2021-11-17 MED ORDER — PROPOFOL 10 MG/ML IV BOLUS
INTRAVENOUS | Status: DC | PRN
  Administered 2021-11-17: 150 mg via INTRAVENOUS

## 2021-11-17 MED ORDER — DEXAMETHASONE SODIUM PHOSPHATE 4 MG/ML IJ SOLN
INTRAMUSCULAR | Status: DC | PRN
  Administered 2021-11-17: 8 mg via INTRAVENOUS

## 2021-11-17 MED ORDER — SODIUM CHLORIDE 0.9 % IV SOLN: INTRAVENOUS | Status: DC

## 2021-11-17 MED ORDER — IOHEXOL 300 MG/ML  SOLN
100.0000 mL | Freq: Once | INTRAMUSCULAR | Status: AC | PRN
  Administered 2021-11-17: 100 mL via INTRAVENOUS

## 2021-11-17 MED ORDER — METOCLOPRAMIDE HCL 5 MG/ML IJ SOLN
INTRAMUSCULAR | Status: DC | PRN
  Administered 2021-11-17: 10 mg via INTRAVENOUS

## 2021-11-17 MED ORDER — LACTATED RINGERS IV SOLN: INTRAVENOUS | Status: DC | PRN

## 2021-11-17 NOTE — Anesthesia Procedure Notes (Signed)
Procedure Name: Intubation Date/Time: 11/17/2021 10:14 AM Performed by: Denese Killings, MD Pre-anesthesia Checklist: Patient identified, Emergency Drugs available, Suction available and Patient being monitored Patient Re-evaluated:Patient Re-evaluated prior to induction Oxygen Delivery Method: Circle system utilized Preoxygenation: Pre-oxygenation with 100% oxygen Induction Type: IV induction and Rapid sequence Laryngoscope Size: Glidescope and 4 Grade View: Grade I Tube type: Oral Tube size: 7.5 mm Number of attempts: 1 Airway Equipment and Method: Stylet Placement Confirmation: ETT inserted through vocal cords under direct vision, positive ETCO2 and breath sounds checked- equal and bilateral Secured at: 22 cm Tube secured with: Tape Dental Injury: Teeth and Oropharynx as per pre-operative assessment

## 2021-11-17 NOTE — Progress Notes (Signed)
PROGRESS NOTE    Sean Golden  GYI:[id number] DOB: Aug 18, 1946 DOA: 11/16/2021 PCP: Celene Squibb, MD   Brief Narrative:  Per HPI: Sean Golden is a 76 y.o. male with medical history significant of history of afib on Eliquis, essential hypertension, GERD, H. pylori, sleep apnea, obesity, and more who was just discharged 5 days ago after a 16-day hospitalization for necrotizing pancreatitis.  He presented with coffee-ground emesis that began last night.  He states that he has been eating very little and is barely drinking any supplemental shakes.  He vomited multiple times and was noted to have coffee-ground material, but no frank blood.  He has not had any melena or rectal bleed and is noticing some fullness in his epigastric region.  He has been gradually losing weight as a result of his poor oral intake.  His last Eliquis dose was yesterday morning.  Patient denies any other symptoms of chest pain, shortness of breath, fevers, chills, night sweats, or diarrhea.   In the ED, he has been seen by GI and started on PPI IV twice daily and given IV fluid.  Recommendations were to place NG tube and plans are for endoscopy in a.m.  Hemoglobin levels appear stable at 13.9 and he has mild leukocytosis of 11.6.  11/17/2021: Patient has undergone upper endoscopy with findings of nonbleeding duodenal ulcer and no other significant findings noted.  He will remain n.p.o. for now with increasing IV fluids and has been started on scheduled Reglan per GI.  Continue to monitor a.m. labs.    Assessment & Plan:   Principal Problem:   Hematemesis Active Problems:   Essential hypertension   Paroxysmal atrial fibrillation ([facility])   Gastroesophageal reflux disease   Helicobacter pylori gastritis   Mixed hyperlipidemia   Acute necrotizing pancreatitis   Obesity with sleep apnea   Leukocytosis  Assessment and Plan: * Hematemesis- (present on admission) Appreciate GI evaluation H/H currently stable Monitor  repeat CBC Maintain NG tube to suction PPI IV twice daily Holding Eliquis for now Anticipate endoscopy 2/25  Obesity with sleep apnea- (present on admission) CPAP at bedtime Lifestyle changes outpatient  Acute necrotizing pancreatitis- (present on admission) This was a recent problem on prior admission Appreciate ongoing GI evaluation with possible CT evaluation needed this admission  Gastroesophageal reflux disease- (present on admission) PPI IV twice daily  Paroxysmal atrial fibrillation ([place])- (present on admission) Holding Eliquis Monitor on telemetry for rate control Holding home diltiazem while n.p.o. Metoprolol IV every 6 hours  Essential hypertension- (present on admission) Monitor closely with use of IV metoprolol Currently elevated and as needed labetalol will be ordered for severe elevations   DVT prophylaxis: SCDs Code Status: Full Family Communication: Wife at bedside 2/25 Disposition Plan:  Status is: Inpatient Remains inpatient appropriate because: Ongoing need for IV medications.  Consultants:  GI  Procedures:  EGD 2/25  Antimicrobials:  None   Subjective: Patient seen and evaluated today with improvements in nausea and abdominal pain this AM.  He has been able to get some rest overnight.  Objective: Vitals:   11/17/21 1115 11/17/21 1130 11/17/21 1159 11/17/21 1313  BP: 111/76 114/75 136/83 128/85  Pulse: 98 (!) 140 (!) 46 62  Resp: (!) 31 (!) 39 (!) 22 19  Temp:  98.7 F (37.1 C) 97.9 F (36.6 C) (!) 97.4 F (36.3 C)  TempSrc:   Oral Oral  SpO2: 97% 94% 95% 92%  Weight:      Height:  Intake/Output Summary (Last 24 hours) at 11/17/2021 1345 Last data filed at 11/17/2021 1300 Gross per 24 hour  Intake 1000 ml  Output 3050 ml  Net -2050 ml   Filed Weights   11/16/21 0706  Weight: 116.6 kg    Examination:  General exam: Appears calm and comfortable  Respiratory system: Clear to auscultation. Respiratory effort  normal. Cardiovascular system: S1 & S2 heard, RRR.  Gastrointestinal system: Abdomen is soft, NG tube with suction of coffee-ground material ongoing Central nervous system: Alert and awake Extremities: No edema Skin: No significant lesions noted Psychiatry: Flat affect.    Data Reviewed: I have personally reviewed following labs and imaging studies  CBC: Recent Labs  Lab 11/11/21 0408 11/16/21 0727 11/17/21 0521  WBC 13.1* 11.6* 10.8*  NEUTROABS 9.8* 9.0*  --   HGB 12.4* 13.9 11.9*  HCT 37.2* 42.1 37.5*  MCV 97.4 95.5 97.2  PLT 579* 631* 932*   Basic Metabolic Panel: Recent Labs  Lab 11/11/21 0408 11/16/21 0727 11/17/21 0521  NA 135 136 139  K 3.9 3.6 3.7  CL 98 100 106  CO2 29 23 25   GLUCOSE 148* 162* 123*  BUN 14 14 20   CREATININE 0.82 0.83 0.91  CALCIUM 8.5* 8.9 8.5*  MG 2.0  --  2.1  PHOS 4.5  --   --    GFR: Estimated Creatinine Clearance: 92.4 mL/min (by C-G formula based on SCr of 0.91 mg/dL). Liver Function Tests: Recent Labs  Lab 11/11/21 0408 11/16/21 0727 11/17/21 0521  AST 46* 28 25  ALT 31 24 20   ALKPHOS 63 73 58  BILITOT 0.8 0.7 0.7  PROT 6.0* 7.3 6.0*  ALBUMIN 2.8* 3.3* 2.8*   Recent Labs  Lab 11/11/21 0408 11/16/21 0727  LIPASE 51 40   No results for input(s): AMMONIA in the last 168 hours. Coagulation Profile: No results for input(s): INR, PROTIME in the last 168 hours. Cardiac Enzymes: No results for input(s): CKTOTAL, CKMB, CKMBINDEX, TROPONINI in the last 168 hours. BNP (last 3 results) No results for input(s): PROBNP in the last 8760 hours. HbA1C: No results for input(s): HGBA1C in the last 72 hours. CBG: No results for input(s): GLUCAP in the last 168 hours. Lipid Profile: No results for input(s): CHOL, HDL, LDLCALC, TRIG, CHOLHDL, LDLDIRECT in the last 72 hours. Thyroid Function Tests: No results for input(s): TSH, T4TOTAL, FREET4, T3FREE, THYROIDAB in the last 72 hours. Anemia Panel: No results for input(s):  VITAMINB12, FOLATE, FERRITIN, TIBC, IRON, RETICCTPCT in the last 72 hours. Sepsis Labs: No results for input(s): PROCALCITON, LATICACIDVEN in the last 168 hours.  Recent Results (from the past 240 hour(s))  Resp Panel by RT-PCR (Flu A&B, Covid) Nasopharyngeal Swab     Status: None   Collection Time: 11/16/21  7:27 AM   Specimen: Nasopharyngeal Swab; Nasopharyngeal(NP) swabs in vial transport medium  Result Value Ref Range Status   SARS Coronavirus 2 by RT PCR NEGATIVE NEGATIVE Final    Comment: (NOTE) SARS-CoV-2 target nucleic acids are NOT DETECTED.  The SARS-CoV-2 RNA is generally detectable in upper respiratory specimens during the acute phase of infection. The lowest concentration of SARS-CoV-2 viral copies this assay can detect is 138 copies/mL. A negative result does not preclude SARS-Cov-2 infection and should not be used as the sole basis for treatment or other patient management decisions. A negative result may occur with  improper specimen collection/handling, submission of specimen other than nasopharyngeal swab, presence of viral mutation(s) within the areas targeted by this assay,  and inadequate number of viral copies(<138 copies/mL). A negative result must be combined with clinical observations, patient history, and epidemiological information. The expected result is Negative.  Fact Sheet for Patients:  EntrepreneurPulse.com.au  Fact Sheet for Healthcare Providers:  IncredibleEmployment.be  This test is no t yet approved or cleared by the [place] FDA and  has been authorized for detection and/or diagnosis of SARS-CoV-2 by FDA under an Emergency Use Authorization (EUA). This EUA will remain  in effect (meaning this test can be used) for the duration of the COVID-19 declaration under Section 564(b)(1) of the Act, 21 U.S.C.section 360bbb-3(b)(1), unless the authorization is terminated  or revoked sooner.       Influenza A  by PCR NEGATIVE NEGATIVE Final   Influenza B by PCR NEGATIVE NEGATIVE Final    Comment: (NOTE) The Xpert Xpress SARS-CoV-2/FLU/RSV plus assay is intended as an aid in the diagnosis of influenza from Nasopharyngeal swab specimens and should not be used as a sole basis for treatment. Nasal washings and aspirates are unacceptable for Xpert Xpress SARS-CoV-2/FLU/RSV testing.  Fact Sheet for Patients: EntrepreneurPulse.com.au  Fact Sheet for Healthcare Providers: IncredibleEmployment.be  This test is not yet approved or cleared by the [place] FDA and has been authorized for detection and/or diagnosis of SARS-CoV-2 by FDA under an Emergency Use Authorization (EUA). This EUA will remain in effect (meaning this test can be used) for the duration of the COVID-19 declaration under Section 564(b)(1) of the Act, 21 U.S.C. section 360bbb-3(b)(1), unless the authorization is terminated or revoked.  Performed at [facility], [address] [place], [place] [address]          Radiology Studies: No results found.      Scheduled Meds:  brimonidine  1 drop Right Eye BID   LORazepam  1 mg Intravenous QHS   metoCLOPramide (REGLAN) injection  10 mg Intravenous Q8H   metoprolol tartrate  5 mg Intravenous Q6H   pantoprazole (PROTONIX) IV  40 mg Intravenous Q12H   Continuous Infusions:  0.9 % NaCl with KCl 20 mEq / L 200 mL/hr at 11/17/21 1200     LOS: 1 day    Time spent: 35 minutes     Darleen Crocker, DO [facility]  If 7PM-7AM, please contact night-coverage [email or web address] 11/17/2021, 1:45 PM

## 2021-11-17 NOTE — Hospital Course (Addendum)
Per HPI: Sean Golden is a 76 y.o. male with medical history significant of history of afib on Eliquis, essential hypertension, GERD, H. pylori, sleep apnea, obesity, and more who was just discharged 5 days ago after a 16-day hospitalization for necrotizing pancreatitis.  He presented with coffee-ground emesis that began last night.  He states that he has been eating very little and is barely drinking any supplemental shakes.  He vomited multiple times and was noted to have coffee-ground material, but no frank blood.  He has not had any melena or rectal bleed and is noticing some fullness in his epigastric region.  He has been gradually losing weight as a result of his poor oral intake.  His last Eliquis dose was yesterday morning.  Patient denies any other symptoms of chest pain, shortness of breath, fevers, chills, night sweats, or diarrhea.   In the ED, he has been seen by GI and started on PPI IV twice daily and given IV fluid.  Recommendations were to place NG tube and plans are for endoscopy in a.m.  Hemoglobin levels appear stable at 13.9 and he has mild leukocytosis of 11.6.  11/17/2021: Patient has undergone upper endoscopy with findings of nonbleeding duodenal ulcer and no other significant findings noted.  He will remain n.p.o. for now with increasing IV fluids and has been started on scheduled Reglan per GI.  Continue to monitor a.m. labs.  11/18/21: Patient has been started on clear liquid diet and it remains to be seen if he may be able to discharge on full liquids, otherwise may require TPN and bowel rest.  IV fluid decreased and GI continues to follow.

## 2021-11-17 NOTE — Anesthesia Postprocedure Evaluation (Signed)
Anesthesia Post Note  Patient: Sean Golden  Procedure(s) Performed: ESOPHAGOGASTRODUODENOSCOPY (EGD) WITH PROPOFOL  Patient location during evaluation: Nursing Unit Anesthesia Type: General Level of consciousness: awake and alert and oriented Pain management: pain level controlled Vital Signs Assessment: post-procedure vital signs reviewed and stable Respiratory status: spontaneous breathing, nonlabored ventilation and respiratory function stable Cardiovascular status: blood pressure returned to baseline and stable Postop Assessment: no apparent nausea or vomiting Anesthetic complications: no   No notable events documented.   Last Vitals:  Vitals:   11/17/21 1130 11/17/21 1159  BP: 114/75 136/83  Pulse: (!) 140 (!) 46  Resp: (!) 39 (!) 22  Temp: 37.1 C 36.6 C  SpO2: 94% 95%    Last Pain:  Vitals:   11/17/21 1159  TempSrc: Oral  PainSc:                   C 

## 2021-11-17 NOTE — Anesthesia Preprocedure Evaluation (Addendum)
Anesthesia Evaluation  Patient identified by MRN, date of birth, ID band Patient awake    Reviewed: Allergy & Precautions, NPO status , Patient's Chart, lab work & pertinent test results  Airway Mallampati: III  TM Distance: >3 FB Neck ROM: Full    Dental  (+) Dental Advisory Given   Pulmonary sleep apnea and Continuous Positive Airway Pressure Ventilation , former smoker,    Pulmonary exam normal breath sounds clear to auscultation       Cardiovascular Exercise Tolerance: Good hypertension, Pt. on medications + dysrhythmias Atrial Fibrillation  Rhythm:Irregular Rate:Abnormal  1. Left ventricular ejection fraction, by estimation, is 60 to 65%. The left ventricle has normal function. The left ventricle has no regional wall motion abnormalities. There is moderate left ventricular hypertrophy.  Left ventricular diastolic parameters are indeterminate.  2. Right ventricular systolic function is normal. The right ventricular size is normal. There is normal pulmonary artery systolic pressure.  3. Left atrial size was mildly dilated.  4. Prominent epicardial adipose tissue.  5. The mitral valve is normal in structure. No evidence of mitral valve  regurgitation. No evidence of mitral stenosis.  6. The aortic valve is tricuspid. There is mild calcification of the aortic valve. Aortic valve regurgitation is not visualized. Aortic valve sclerosis is present, with no evidence of aortic valve stenosis.  7. The inferior vena cava is normal in size with greater than 50% respiratory variability, suggesting right atrial pressure of 3 mmHg.    Neuro/Psych negative neurological ROS  negative psych ROS   GI/Hepatic Neg liver ROS, GERD  Medicated,  Endo/Other  negative endocrine ROS  Renal/GU Renal disease  negative genitourinary   Musculoskeletal negative musculoskeletal ROS (+)   Abdominal   Peds negative pediatric ROS (+)   Hematology  (+) Blood dyscrasia, anemia ,   Anesthesia Other Findings   Reproductive/Obstetrics negative OB ROS                          Anesthesia Physical Anesthesia Plan  ASA: 3  Anesthesia Plan: General   Post-op Pain Management: Minimal or no pain anticipated   Induction: Intravenous, Rapid sequence and Cricoid pressure planned  PONV Risk Score and Plan: Ondansetron and Metaclopromide  Airway Management Planned: Oral ETT  Additional Equipment:   Intra-op Plan:   Post-operative Plan: Possible Post-op intubation/ventilation and Extubation in OR  Informed Consent: I have reviewed the patients History and Physical, chart, labs and discussed the procedure including the risks, benefits and alternatives for the proposed anesthesia with the patient or authorized representative who has indicated his/her understanding and acceptance.     Dental advisory given  Plan Discussed with: Surgeon  Anesthesia Plan Comments:        Anesthesia Quick Evaluation

## 2021-11-17 NOTE — Transfer of Care (Signed)
Immediate Anesthesia Transfer of Care Note  Patient: Sean Golden  Procedure(s) Performed: ESOPHAGOGASTRODUODENOSCOPY (EGD) WITH PROPOFOL  Patient Location: PACU  Anesthesia Type:General  Level of Consciousness: awake, alert  and oriented  Airway & Oxygen Therapy: Patient connected to nasal cannula oxygen  Post-op Assessment: Post -op Vital signs reviewed and stable  Post vital signs: Reviewed and stable  Last Vitals:  Vitals Value Taken Time  BP 96/69 11/17/21 1055  Temp 98.7   Pulse 98 11/17/21 1100  Resp 28 11/17/21 1100  SpO2 97 % 11/17/21 1100  Vitals shown include unvalidated device data.  Last Pain:  Vitals:   11/17/21 0521  TempSrc: Oral  PainSc: 0-No pain         Complications: No notable events documented.

## 2021-11-17 NOTE — Addendum Note (Signed)
Addendum  created 11/17/21 1311 by Denese Killings, MD   Intraprocedure Meds edited

## 2021-11-17 NOTE — Progress Notes (Signed)
Subjective:  Patient feels much better.  He states he was able to sleep last night.  He is able to turn in bed without any difficulty.  He is not nauseated.  He does not have abdominal pain.  He feels hungry this morning.  Current Medications:  Current Facility-Administered Medications:    0.9 %  sodium chloride infusion, , Intravenous, Continuous, Manuella Ghazi, Pratik D, DO, Last Rate: 75 mL/hr at 11/16/21 1253, New Bag at 11/16/21 1253   0.9 % NaCl with KCl 20 mEq/ L  infusion, , Intravenous, Continuous, ,  U, MD, Last Rate: 150 mL/hr at 11/16/21 8675, New Bag at 11/16/21 1824   acetaminophen (TYLENOL) tablet 650 mg, 650 mg, Oral, Q6H PRN **OR** acetaminophen (TYLENOL) suppository 650 mg, 650 mg, Rectal, Q6H PRN, Manuella Ghazi, Pratik D, DO   brimonidine (ALPHAGAN) 0.2 % ophthalmic solution 1 drop, 1 drop, Right Eye, BID, Manuella Ghazi, Pratik D, DO, 1 drop at 11/16/21 2131   labetalol (NORMODYNE) injection 10 mg, 10 mg, Intravenous, Q2H PRN, Manuella Ghazi, Pratik D, DO   LORazepam (ATIVAN) injection 1 mg, 1 mg, Intravenous, QHS, ,  U, MD, 1 mg at 11/16/21 2135   metoprolol tartrate (LOPRESSOR) injection 5 mg, 5 mg, Intravenous, Q6H, Shah, Pratik D, DO, 5 mg at 11/17/21 0933   ondansetron (ZOFRAN) tablet 4 mg, 4 mg, Oral, Q6H PRN **OR** ondansetron (ZOFRAN) injection 4 mg, 4 mg, Intravenous, Q6H PRN, Manuella Ghazi, Pratik D, DO   pantoprazole (PROTONIX) injection 40 mg, 40 mg, Intravenous, Q12H, Shah, Pratik D, DO, 40 mg at 11/17/21 0933   Objective: Blood pressure (!) 142/94, pulse (!) 40, temperature 98.3 F (36.8 C), temperature source Oral, resp. rate 18, height $RemoveBe'5\' 10"'VkdDutddb$  (1.778 m), weight 116.6 kg, SpO2 93 %. Patient is alert and in no acute distress. NG returned and remains coffee-ground by dilute. Cardiac exam with regular rhythm normal S1 and S2. No murmur or gallop noted. Lungs are clear to auscultation. Abdomen is full.  Bowel sounds are normal.  On palpation abdomen is soft and nontender with  organomegaly or masses. No LE edema or clubbing noted.  Labs/studies Results:   CBC Latest Ref Rng & Units 11/17/2021 11/16/2021 11/11/2021  WBC 4.0 - 10.5 K/uL 10.8(H) 11.6(H) 13.1(H)  Hemoglobin 13.0 - 17.0 g/dL 11.9(L) 13.9 12.4(L)  Hematocrit 39.0 - 52.0 % 37.5(L) 42.1 37.2(L)  Platelets 150 - 400 K/uL 532(H) 631(H) 579(H)    CMP Latest Ref Rng & Units 11/17/2021 11/16/2021 11/11/2021  Glucose 70 - 99 mg/dL 123(H) 162(H) 148(H)  BUN 8 - 23 mg/dL $Remove'20 14 14  'ycgDJXO$ Creatinine 0.61 - 1.24 mg/dL 0.91 0.83 0.82  Sodium 135 - 145 mmol/L 139 136 135  Potassium 3.5 - 5.1 mmol/L 3.7 3.6 3.9  Chloride 98 - 111 mmol/L 106 100 98  CO2 22 - 32 mmol/L $RemoveB'25 23 29  'FMOEGiUM$ Calcium 8.9 - 10.3 mg/dL 8.5(L) 8.9 8.5(L)  Total Protein 6.5 - 8.1 g/dL 6.0(L) 7.3 6.0(L)  Total Bilirubin 0.3 - 1.2 mg/dL 0.7 0.7 0.8  Alkaline Phos 38 - 126 U/L 58 73 63  AST 15 - 41 U/L 25 28 46(H)  ALT 0 - 44 U/L $Remo'20 24 31    'sDqmC$ Hepatic Function Latest Ref Rng & Units 11/17/2021 11/16/2021 11/11/2021  Total Protein 6.5 - 8.1 g/dL 6.0(L) 7.3 6.0(L)  Albumin 3.5 - 5.0 g/dL 2.8(L) 3.3(L) 2.8(L)  AST 15 - 41 U/L 25 28 46(H)  ALT 0 - 44 U/L $Remo'20 24 31  'xQSPv$ Alk Phosphatase 38 - 126 U/L 58 73  63  Total Bilirubin 0.3 - 1.2 mg/dL 0.7 0.7 0.8    NG output last 24 hours 3850 mL Urine output 450 mL last 24 hours.   Assessment:  #1.  Upper GI bleed.  He has a large NG output raising possibility of gastric outlet obstruction.  He could also have pyloric stenosis or peptic ulcer disease.  #2.  Anemia.  Hemoglobin has dropped post hydration but not to the point that he is blood transfusion.  #3.  History of necrotizing pancreatitis.  Lipase on admission was normal.  He will need pancreas protocol CT soon.  #4.  Thrombocytosis most likely secondary to upper GI bleed but also can be a marker for inflammatory process i.e. pancreatitis.   Plan:  Proceed with esophagogastroduodenoscopy with therapeutic intention. Discussed with Dr. Charna Elizabeth.  We will be  intubated for the procedure for airway safety.

## 2021-11-17 NOTE — Progress Notes (Signed)
Brief EGD note.  Normal hypopharyngeal and esophageal mucosa. Irregular GE junction at 43 cm from the incisors. Small amount of food debris and coffee-ground material in proximal stomach.  Most of this was suctioned out. Antral gastritis with mucosal hemorrhages felt to be due to NG tube trauma. Patent pylorus. Bulbar duodenitis was 3 mm bulbar ulcer with clean base. Post bulbar duodenitis with noncritical narrowing. Mucosa of third part of the duodenum normal.  Patient tolerated procedure well.

## 2021-11-17 NOTE — Op Note (Signed)
[facility] Patient Name: Sean Golden Procedure Date: 11/17/2021 9:49 AM MRN: [id number] Date of Birth: 1946-07-06 Attending MD: Hildred Laser , MD CSN: [id number] Age: 76 Admit Type: Outpatient Procedure:                Upper GI endoscopy Indications:              Coffee-ground emesis Providers:                Hildred Laser, MD, Lurline Del, RN, Bonnetta Barry,                            Technician Referring MD:             Oneida Arenas, DO Medicines:                General Anesthesia Complications:            No immediate complications. Estimated Blood Loss:     Estimated blood loss: none. Procedure:                Pre-Anesthesia Assessment:                           - Prior to the procedure, a History and Physical                            was performed, and patient medications and                            allergies were reviewed. The patient's tolerance of                            previous anesthesia was also reviewed. The risks                            and benefits of the procedure and the sedation                            options and risks were discussed with the patient.                            All questions were answered, and informed consent                            was obtained. Prior Anticoagulants: The patient has                            taken no previous anticoagulant or antiplatelet                            agents. ASA Grade Assessment: III - A patient with                            severe systemic disease. After reviewing the risks  and benefits, the patient was deemed in                            satisfactory condition to undergo the procedure.                           After obtaining informed consent, the endoscope was                            passed under direct vision. Throughout the                            procedure, the patient's blood pressure, pulse, and                            oxygen saturations were  monitored continuously. The                            [id number]-H190 ([id number]) scope was introduced through the                            mouth, and advanced to the third part of duodenum.                            The upper GI endoscopy was accomplished without                            difficulty. The patient tolerated the procedure                            well. Scope In: 10:24:23 AM Scope Out: 10:38:11 AM Total Procedure Duration: 0 hours 13 minutes 48 seconds  Findings:      The hypopharynx was normal.      The examined esophagus was normal.      The Z-line was irregular and was found 43 cm from the incisors.      Hematin (altered blood/coffee-ground-like material) was found in the       gastric fundus.      Patchy mild mucosal changes characterized by erythema, erosion and       petechiae were found in the gastric antrum.      The pylorus was normal.      One non-bleeding superficial duodenal ulcer with no stigmata of bleeding       was found in the duodenal bulb. The lesion was 3 mm in largest dimension.      Diffuse moderate inflammation characterized by congestion (edema),       erythema and friability was found in the second portion of the duodenum.      The third portion of the duodenum was normal. Impression:               - Normal hypopharynx.                           - Normal esophagus.                           -  Z-line irregular, 43 cm from the incisors.                           - Hematin (altered blood/coffee-ground-like                            material) in the gastric fundus. Most this was                            suctioned out.                           - Erythematous, eroded and petechial mucosa in the                            antrum. These changes were felt to be due to NG                            tube trauma                           - Normal pylorus.                           - Non-bleeding duodenal ulcer with no stigmata of                             bleeding.                           - Duodenitis involving second part of the duodenum                            with noncritical luminal narrowing.                           - Normal third portion of the duodenum.                           - No specimens collected. Moderate Sedation:      Per Anesthesia Care Recommendation:           - Return patient to hospital ward for ongoing care.                           - NPO except ice chips..                           - Continue present medications.                           - Metoclopramide 10 mg IV every 8 hours.                           - Perform CT scan (computed tomography) of the  abdomen with IV contrast today.                           - IV fluid rate increased to 200 mL/h. Procedure Code(s):        --- Professional ---                           [phone], Esophagogastroduodenoscopy, flexible,                            transoral; diagnostic, including collection of                            specimen(s) by brushing or washing, when performed                            (separate procedure) Diagnosis Code(s):        --- Professional ---                           K22.8, Other specified diseases of esophagus                           K92.2, Gastrointestinal hemorrhage, unspecified                           K25.9, Gastric ulcer, unspecified as acute or                            chronic, without hemorrhage or perforation                           K31.89, Other diseases of stomach and duodenum                           K26.9, Duodenal ulcer, unspecified as acute or                            chronic, without hemorrhage or perforation                           K29.80, Duodenitis without bleeding                           K92.0, Hematemesis CPT copyright 2019 [employer]. All rights reserved. The codes documented in this report are preliminary and upon coder review may  be revised to meet current  compliance requirements. Hildred Laser, MD Hildred Laser, MD 11/17/2021 10:56:30 AM This report has been signed electronically. Number of Addenda: 0

## 2021-11-17 NOTE — Progress Notes (Signed)
Pt off unit for EGD. Returned to unit with no distress noted. NGT removed during procedure. Denies pain. Bed in lowest position. Call bell in reach.

## 2021-11-17 NOTE — Progress Notes (Signed)
°   11/17/21 0521  Assess: MEWS Score  Temp 98.3 F (36.8 C)  BP (!) 142/94  Pulse Rate (!) 25  Resp 18  SpO2 93 %  O2 Device Room Air  Assess: MEWS Score  MEWS Temp 0  MEWS Systolic 0  MEWS Pulse 2  MEWS RR 0  MEWS LOC 0  MEWS Score 2  MEWS Score Color Yellow  Assess: if the MEWS score is Yellow or Red  Were vital signs taken at a resting state? Yes  Focused Assessment No change from prior assessment  Early Detection of Sepsis Score *See Row Information* Medium  MEWS guidelines implemented *See Row Information* No, vital signs rechecked  Treat  MEWS Interventions Escalated (See documentation below);Other (Comment)  Pain Scale 0-10  Pain Score 0  Escalate  MEWS: Escalate Yellow: discuss with charge nurse/RN and consider discussing with provider and RRT  Notify: Charge Nurse/RN  Name of Charge Nurse/RN Notified Reather Converse, RN  Date Charge Nurse/RN Notified 11/17/21  Time Charge Nurse/RN Notified 2446  Notify: Provider  Provider Name/Title Josephine Cables, MD  Date Provider Notified 11/17/21  Time Provider Notified 0600  Notification Type Page  Notification Reason Other (Comment) (yellow MEWS, Hold Metoprolol?)

## 2021-11-17 NOTE — Progress Notes (Signed)
Initial Nutrition Assessment  DOCUMENTATION CODES:   Obesity unspecified  INTERVENTION:   -RD will follow for diet advancement and add supplements as appropriate  NUTRITION DIAGNOSIS:   Inadequate oral intake related to altered GI function as evidenced by NPO status.  GOAL:   Patient will meet greater than or equal to 90% of their needs  MONITOR:   Diet advancement, Labs, Weight trends, Skin, I & O's  REASON FOR ASSESSMENT:   Malnutrition Screening Tool    ASSESSMENT:   Sean Golden is a 76 y.o. male with medical history significant of history of afib on Eliquis, essential hypertension, GERD, H. pylori, sleep apnea, obesity, and more who was just discharged 5 days ago after a 16-day hospitalization for necrotizing pancreatitis.  He presented with coffee-ground emesis that began last night.  He states that he has been eating very little and is barely drinking any supplemental shakes.  He vomited multiple times and was noted to have coffee-ground material, but no frank blood.  He has not had any melena or rectal bleed and is noticing some fullness in his epigastric region.  He has been gradually losing weight as a result of his poor oral intake.  His last Eliquis dose was yesterday morning.  Patient denies any other symptoms of chest pain, shortness of breath, fevers, chills, night sweats, or diarrhea.  Pt admitted with hematemesis.   2/25- s/p EGD revealed irregular GE junction, small amount of food debris in stomach, antral gastric (2/2 NGT trauma), bulbar duodenitis  Reviewed I/O's: -3 L x 24 hours  UOP: 150 ml x 25 hours  NGT output: 2.9 L x 24 hours  Pt unavailable at time of visit. Attempted to speak with pt via call to hospital room phone, however, unable to reach. RD unable to obtain further nutrition-related history or complete nutrition-focused physical exam at this time.    Pt currently NPO and in endoscopy suite at time of visit.   Reviewed wt hx; wt has been  stable over the past 6 months.   Medications reviewed and include ativan, reglan, and 0.9% NaCl with KCl 20 Eq/L infusion @ 200 ml/hr.   Labs reviewed.   Diet Order:   Diet Order             Diet NPO time specified  Diet effective now                   EDUCATION NEEDS:   No education needs have been identified at this time  Skin:  Skin Assessment: Reviewed RN Assessment  Last BM:  11/15/21  Height:   Ht Readings from Last 1 Encounters:  11/16/21 5\' 10"  (1.778 m)    Weight:   Wt Readings from Last 1 Encounters:  11/16/21 116.6 kg    Ideal Body Weight:  75.5 kg  BMI:  Body mass index is 36.88 kg/m.  Estimated Nutritional Needs:   Kcal:  2482-5003  Protein:  115-130 grams  Fluid:  > 2 L    Loistine Chance, RD, LDN, [place] Registered Dietitian II Certified Diabetes Care and Education Specialist Please refer to [facility] for RD and/or RD on-call/weekend/after hours pager

## 2021-11-18 DIAGNOSIS — K259 Gastric ulcer, unspecified as acute or chronic, without hemorrhage or perforation: Secondary | ICD-10-CM | POA: Diagnosis not present

## 2021-11-18 DIAGNOSIS — K2970 Gastritis, unspecified, without bleeding: Secondary | ICD-10-CM | POA: Diagnosis not present

## 2021-11-18 DIAGNOSIS — K922 Gastrointestinal hemorrhage, unspecified: Secondary | ICD-10-CM | POA: Diagnosis not present

## 2021-11-18 DIAGNOSIS — B9681 Helicobacter pylori [H. pylori] as the cause of diseases classified elsewhere: Secondary | ICD-10-CM | POA: Diagnosis not present

## 2021-11-18 LAB — CBC
HCT: 35.8 % — ABNORMAL LOW (ref 39.0–52.0)
Hemoglobin: 11.1 g/dL — ABNORMAL LOW (ref 13.0–17.0)
MCH: 30.3 pg (ref 26.0–34.0)
MCHC: 31 g/dL (ref 30.0–36.0)
MCV: 97.8 fL (ref 80.0–100.0)
Platelets: 495 10*3/uL — ABNORMAL HIGH (ref 150–400)
RBC: 3.66 MIL/uL — ABNORMAL LOW (ref 4.22–5.81)
RDW: 13.2 % (ref 11.5–15.5)
WBC: 9.9 10*3/uL (ref 4.0–10.5)
nRBC: 0 % (ref 0.0–0.2)

## 2021-11-18 LAB — BASIC METABOLIC PANEL
Anion gap: 8 (ref 5–15)
BUN: 19 mg/dL (ref 8–23)
CO2: 21 mmol/L — ABNORMAL LOW (ref 22–32)
Calcium: 8.3 mg/dL — ABNORMAL LOW (ref 8.9–10.3)
Chloride: 110 mmol/L (ref 98–111)
Creatinine, Ser: 0.8 mg/dL (ref 0.61–1.24)
GFR, Estimated: >60 mL/min (ref >60)
Glucose, Bld: 135 mg/dL — ABNORMAL HIGH (ref 70–99)
Potassium: 4.3 mmol/L (ref 3.5–5.1)
Sodium: 139 mmol/L (ref 135–145)

## 2021-11-18 LAB — MAGNESIUM: Magnesium: 2 mg/dL (ref 1.7–2.4)

## 2021-11-18 NOTE — Progress Notes (Signed)
Subjective:  Patient has no complaints.  He says he has had hiccups intermittently.  He had clear liquids without any difficulty or nausea.  He is not having any heartburn nausea or abdominal pain.  He says he only slept for 3-1/2 hours last night.  He did have a bowel movement last night.  Was black and formed.  Stool was black.  Current Medications:  Current Facility-Administered Medications:    0.9 % NaCl with KCl 20 mEq/ L  infusion, , Intravenous, Continuous, ,  U, MD, Last Rate: 200 mL/hr at 11/18/21 0452, Infusion Verify at 11/18/21 0452   acetaminophen (TYLENOL) tablet 650 mg, 650 mg, Oral, Q6H PRN **OR** acetaminophen (TYLENOL) suppository 650 mg, 650 mg, Rectal, Q6H PRN, Manuella Ghazi, Pratik D, DO   brimonidine (ALPHAGAN) 0.2 % ophthalmic solution 1 drop, 1 drop, Right Eye, BID, Manuella Ghazi, Pratik D, DO, 1 drop at 11/18/21 4163   labetalol (NORMODYNE) injection 10 mg, 10 mg, Intravenous, Q2H PRN, Manuella Ghazi, Pratik D, DO   LORazepam (ATIVAN) injection 1 mg, 1 mg, Intravenous, QHS, ,  U, MD, 1 mg at 11/17/21 2114   metoCLOPramide (REGLAN) injection 10 mg, 10 mg, Intravenous, Q8H, ,  U, MD, 10 mg at 11/18/21 0609   metoprolol tartrate (LOPRESSOR) injection 5 mg, 5 mg, Intravenous, Q6H, Shah, Pratik D, DO, 5 mg at 11/18/21 0906   ondansetron (ZOFRAN) tablet 4 mg, 4 mg, Oral, Q6H PRN **OR** ondansetron (ZOFRAN) injection 4 mg, 4 mg, Intravenous, Q6H PRN, Manuella Ghazi, Pratik D, DO   pantoprazole (PROTONIX) injection 40 mg, 40 mg, Intravenous, Q12H, Shah, Pratik D, DO, 40 mg at 11/18/21 0906   Objective: Blood pressure 129/87, pulse (!) 102, temperature 98.5 F (36.9 C), temperature source Oral, resp. rate 19, height $RemoveBe'5\' 10"'lYqhVXwwr$  (1.778 m), weight 116.6 kg, SpO2 97 %. Patient is alert and in no acute distress. He is having hiccups. Cardiac exam with regular rhythm normal S1 and S2. No murmur or gallop noted. Lungs are clear to auscultation. Abdomen is full.  Bowel sounds are normal.   On palpation abdomen is soft without tenderness organomegaly or masses. No LE edema or clubbing noted.  Labs/studies Results:   CBC Latest Ref Rng & Units 11/18/2021 11/17/2021 11/16/2021  WBC 4.0 - 10.5 K/uL 9.9 10.8(H) 11.6(H)  Hemoglobin 13.0 - 17.0 g/dL 11.1(L) 11.9(L) 13.9  Hematocrit 39.0 - 52.0 % 35.8(L) 37.5(L) 42.1  Platelets 150 - 400 K/uL 495(H) 532(H) 631(H)    CMP Latest Ref Rng & Units 11/18/2021 11/17/2021 11/16/2021  Glucose 70 - 99 mg/dL 135(H) 123(H) 162(H)  BUN 8 - 23 mg/dL $Remove'19 20 14  'RTACsdn$ Creatinine 0.61 - 1.24 mg/dL 0.80 0.91 0.83  Sodium 135 - 145 mmol/L 139 139 136  Potassium 3.5 - 5.1 mmol/L 4.3 3.7 3.6  Chloride 98 - 111 mmol/L 110 106 100  CO2 22 - 32 mmol/L 21(L) 25 23  Calcium 8.9 - 10.3 mg/dL 8.3(L) 8.5(L) 8.9  Total Protein 6.5 - 8.1 g/dL - 6.0(L) 7.3  Total Bilirubin 0.3 - 1.2 mg/dL - 0.7 0.7  Alkaline Phos 38 - 126 U/L - 58 73  AST 15 - 41 U/L - 25 28  ALT 0 - 44 U/L - 20 24    Hepatic Function Latest Ref Rng & Units 11/17/2021 11/16/2021 11/11/2021  Total Protein 6.5 - 8.1 g/dL 6.0(L) 7.3 6.0(L)  Albumin 3.5 - 5.0 g/dL 2.8(L) 3.3(L) 2.8(L)  AST 15 - 41 U/L 25 28 46(H)  ALT 0 - 44 U/L $Remo'20 24 31  'tcmWi$ Alk  Phosphatase 38 - 126 U/L 58 73 63  Total Bilirubin 0.3 - 1.2 mg/dL 0.7 0.7 0.8     Assessment:  #1.  Upper GI bleed secondary to duodenitis and duodenal ulcer.  He could also have an Mallory-Weiss tear in setting of repeated episodes of vomiting.  I did not see Mallory Weiss tear on EGD yesterday.  #2.  Duodenal stricture resulting in gastric outlet obstruction.  Stricture is secondary to pancreatitis and peripancreatic edema.  He is now on clear liquids and remains to be seen if he be able to tolerate diet.  #3.  Recent hospitalization for necrotizing pancreatitis.  He now has developed multiple cysts but they are all smaller than 5 cm.  #4.  Anemia secondary to upper GI bleed.  Hemoglobin is low but but stable.   Plan:  Decrease IV fluid to 100  mL/h. Patient can 1 cup of coffee with his clear liquids. Will check gastric volume with bladder scan but it may not be accurate.

## 2021-11-18 NOTE — Progress Notes (Signed)
PROGRESS NOTE    [name]  HWE:[id number] DOB: 02/22/1946 DOA: 11/16/2021 PCP: Celene Squibb, MD   Brief Narrative:  Per HPI: Sean Golden is a 76 y.o. male with medical history significant of history of afib on Eliquis, essential hypertension, GERD, H. pylori, sleep apnea, obesity, and more who was just discharged 5 days ago after a 16-day hospitalization for necrotizing pancreatitis.  He presented with coffee-ground emesis that began last night.  He states that he has been eating very little and is barely drinking any supplemental shakes.  He vomited multiple times and was noted to have coffee-ground material, but no frank blood.  He has not had any melena or rectal bleed and is noticing some fullness in his epigastric region.  He has been gradually losing weight as a result of his poor oral intake.  His last Eliquis dose was yesterday morning.  Patient denies any other symptoms of chest pain, shortness of breath, fevers, chills, night sweats, or diarrhea.   In the ED, he has been seen by GI and started on PPI IV twice daily and given IV fluid.  Recommendations were to place NG tube and plans are for endoscopy in a.m.  Hemoglobin levels appear stable at 13.9 and he has mild leukocytosis of 11.6.  11/17/2021: Patient has undergone upper endoscopy with findings of nonbleeding duodenal ulcer and no other significant findings noted.  He will remain n.p.o. for now with increasing IV fluids and has been started on scheduled Reglan per GI.  Continue to monitor a.m. labs.  11/18/21: Patient has been started on clear liquid diet and it remains to be seen if he may be able to discharge on full liquids, otherwise may require TPN and bowel rest.  IV fluid decreased and GI continues to follow.    Assessment & Plan:   Principal Problem:   Hematemesis Active Problems:   Essential hypertension   Paroxysmal atrial fibrillation (HCC)   Gastroesophageal reflux disease   Helicobacter pylori gastritis    Mixed hyperlipidemia   Acute necrotizing pancreatitis   Obesity with sleep apnea   Leukocytosis  Assessment and Plan: * Hematemesis- (present on admission) Currently resolved and started on clear liquid diet Appreciate GI evaluation with upper endoscopy on 2/25 demonstrating nonbleeding duodenal ulcer H/H currently stable Monitor repeat CBC PPI IV twice daily Holding Eliquis for now Started on Reglan every 8 hours  IV fluid rate decreased Anticipate hopeful discharge on full liquid diet if possible  Obesity with sleep apnea- (present on admission) CPAP at bedtime Lifestyle changes outpatient  Acute necrotizing pancreatitis- (present on admission) This was a recent problem on prior admission Appreciate ongoing GI evaluation with possible CT evaluation needed this admission  Gastroesophageal reflux disease- (present on admission) PPI IV twice daily  Paroxysmal atrial fibrillation ([place])- (present on admission) Holding Eliquis Monitor on telemetry for rate control Holding home diltiazem while n.p.o. Metoprolol IV every 6 hours  Essential hypertension- (present on admission) Monitor closely with use of IV metoprolol Currently elevated and as needed labetalol will be ordered for severe elevations    DVT prophylaxis: SCDs, hopeful restart of Eliquis in am Code Status: Full Family Communication: Wife at bedside 2/26 Disposition Plan:  Status is: Inpatient Remains inpatient appropriate because: Ongoing need for IV medications.   Consultants:  GI   Procedures:  EGD 2/25   Antimicrobials:  None    Subjective: Patient seen and evaluated today with no new acute complaints or concerns. No acute concerns or events  noted overnight.  He is eager to trial his clear liquid diet.  Noted to have black bowel movement last night.  Denies any abdominal pain, nausea or vomiting currently.  Objective: Vitals:   11/17/21 2125 11/18/21 0300 11/18/21 0507 11/18/21 1405  BP: 125/84  128/80 129/87 (!) 140/91  Pulse: (!) 109 88 (!) 102 (!) 105  Resp: 19 18 19 18   Temp: 97.9 F (36.6 C)  98.5 F (36.9 C) 97.7 F (36.5 C)  TempSrc: Oral  Oral Oral  SpO2: 94%  97% 98%  Weight:      Height:        Intake/Output Summary (Last 24 hours) at 11/18/2021 1413 Last data filed at 11/18/2021 0950 Gross per 24 hour  Intake 6346.52 ml  Output 1750 ml  Net 4596.52 ml   Filed Weights   11/16/21 0706  Weight: 116.6 kg    Examination:  General exam: Appears calm and comfortable, obese Respiratory system: Clear to auscultation. Respiratory effort normal. Cardiovascular system: S1 & S2 heard, RRR.  Gastrointestinal system: Abdomen is soft Central nervous system: Alert and awake Extremities: No edema Skin: No significant lesions noted Psychiatry: Flat affect.    Data Reviewed: I have personally reviewed following labs and imaging studies  CBC: Recent Labs  Lab 11/16/21 0727 11/17/21 0521 11/18/21 0557  WBC 11.6* 10.8* 9.9  NEUTROABS 9.0*  --   --   HGB 13.9 11.9* 11.1*  HCT 42.1 37.5* 35.8*  MCV 95.5 97.2 97.8  PLT 631* 532* 332*   Basic Metabolic Panel: Recent Labs  Lab 11/16/21 0727 11/17/21 0521 11/18/21 0557  NA 136 139 139  K 3.6 3.7 4.3  CL 100 106 110  CO2 23 25 21*  GLUCOSE 162* 123* 135*  BUN 14 20 19   CREATININE 0.83 0.91 0.80  CALCIUM 8.9 8.5* 8.3*  MG  --  2.1 2.0   GFR: Estimated Creatinine Clearance: 105.2 mL/min (by C-G formula based on SCr of 0.8 mg/dL). Liver Function Tests: Recent Labs  Lab 11/16/21 0727 11/17/21 0521  AST 28 25  ALT 24 20  ALKPHOS 73 58  BILITOT 0.7 0.7  PROT 7.3 6.0*  ALBUMIN 3.3* 2.8*   Recent Labs  Lab 11/16/21 0727  LIPASE 40   No results for input(s): AMMONIA in the last 168 hours. Coagulation Profile: No results for input(s): INR, PROTIME in the last 168 hours. Cardiac Enzymes: No results for input(s): CKTOTAL, CKMB, CKMBINDEX, TROPONINI in the last 168 hours. BNP (last 3 results) No  results for input(s): PROBNP in the last 8760 hours. HbA1C: No results for input(s): HGBA1C in the last 72 hours. CBG: No results for input(s): GLUCAP in the last 168 hours. Lipid Profile: No results for input(s): CHOL, HDL, LDLCALC, TRIG, CHOLHDL, LDLDIRECT in the last 72 hours. Thyroid Function Tests: No results for input(s): TSH, T4TOTAL, FREET4, T3FREE, THYROIDAB in the last 72 hours. Anemia Panel: No results for input(s): VITAMINB12, FOLATE, FERRITIN, TIBC, IRON, RETICCTPCT in the last 72 hours. Sepsis Labs: No results for input(s): PROCALCITON, LATICACIDVEN in the last 168 hours.  Recent Results (from the past 240 hour(s))  Resp Panel by RT-PCR (Flu A&B, Covid) Nasopharyngeal Swab     Status: None   Collection Time: 11/16/21  7:27 AM   Specimen: Nasopharyngeal Swab; Nasopharyngeal(NP) swabs in vial transport medium  Result Value Ref Range Status   SARS Coronavirus 2 by RT PCR NEGATIVE NEGATIVE Final    Comment: (NOTE) SARS-CoV-2 target nucleic acids are NOT DETECTED.  The  SARS-CoV-2 RNA is generally detectable in upper respiratory specimens during the acute phase of infection. The lowest concentration of SARS-CoV-2 viral copies this assay can detect is 138 copies/mL. A negative result does not preclude SARS-Cov-2 infection and should not be used as the sole basis for treatment or other patient management decisions. A negative result may occur with  improper specimen collection/handling, submission of specimen other than nasopharyngeal swab, presence of viral mutation(s) within the areas targeted by this assay, and inadequate number of viral copies(<138 copies/mL). A negative result must be combined with clinical observations, patient history, and epidemiological information. The expected result is Negative.  Fact Sheet for Patients:  EntrepreneurPulse.com.au  Fact Sheet for Healthcare Providers:  IncredibleEmployment.be  This test is  no t yet approved or cleared by the [place] FDA and  has been authorized for detection and/or diagnosis of SARS-CoV-2 by FDA under an Emergency Use Authorization (EUA). This EUA will remain  in effect (meaning this test can be used) for the duration of the COVID-19 declaration under Section 564(b)(1) of the Act, 21 U.S.C.section 360bbb-3(b)(1), unless the authorization is terminated  or revoked sooner.       Influenza A by PCR NEGATIVE NEGATIVE Final   Influenza B by PCR NEGATIVE NEGATIVE Final    Comment: (NOTE) The Xpert Xpress SARS-CoV-2/FLU/RSV plus assay is intended as an aid in the diagnosis of influenza from Nasopharyngeal swab specimens and should not be used as a sole basis for treatment. Nasal washings and aspirates are unacceptable for Xpert Xpress SARS-CoV-2/FLU/RSV testing.  Fact Sheet for Patients: EntrepreneurPulse.com.au  Fact Sheet for Healthcare Providers: IncredibleEmployment.be  This test is not yet approved or cleared by the [place] FDA and has been authorized for detection and/or diagnosis of SARS-CoV-2 by FDA under an Emergency Use Authorization (EUA). This EUA will remain in effect (meaning this test can be used) for the duration of the COVID-19 declaration under Section 564(b)(1) of the Act, 21 U.S.C. section 360bbb-3(b)(1), unless the authorization is terminated or revoked.  Performed at [facility], [address] [place], [place] [address]          Radiology Studies: CT ABDOMEN PELVIS W CONTRAST  Result Date: 11/17/2021 CLINICAL DATA:  Recent hospitalization for necrotizing pancreatitis. Follow-up study. EXAM: CT ABDOMEN AND PELVIS WITH CONTRAST TECHNIQUE: Multidetector CT imaging of the abdomen and pelvis was performed using the standard protocol following bolus administration of intravenous contrast. RADIATION DOSE REDUCTION: This exam was performed according to the departmental dose-optimization  program which includes automated exposure control, adjustment of the mA and/or kV according to patient size and/or use of iterative reconstruction technique. CONTRAST:  126mL OMNIPAQUE IOHEXOL 300 MG/ML  SOLN COMPARISON:  11/01/2021. FINDINGS: Lower chest: Small pleural effusions associated with mild dependent lower lobe atelectasis. Atelectasis improved since the prior CT. Hepatobiliary: Liver normal in size and overall attenuation. Tiny low-attenuation lesion in the left lobe, too small to characterize, likely a cyst. No other liver masses or lesions. Normal gallbladder. Common bile duct measures 8 mm, increased when compared to the prior CT. No intrahepatic bile duct dilation. No evidence of a duct stone. Pancreas: Peripancreatic fluid surrounds the pancreas. There are interspersed areas of decreased enhancement along the tail suggesting necrosis. Inflammatory type stranding extends from peripancreatic fluid along the gastro duodenal ligament and anterior pararenal fascia. Lobulated fluid collection is seen extending from the pancreatic tail adjacent to the left anterior pararenal fascia, measuring 8.1 x 2.9 x 3.7 cm, new since the prior CT. There is  a second less well-defined fluid collection just anterior and superior to the pancreatic tail, measuring approximately 5.6 x 2.7 x 2.6 cm. Small fluid collections lie adjacent to the pancreatic head. The largest of these, which lies between the duodenum and inferior margin of the liver, is ill-defined, measuring approximately 4.7 x 3.6 x 3.5 cm. Splenic vein is attenuated, but not thrombosed/occluded. Portal vein is widely patent. Superior mesenteric vein is widely patent. Spleen: Normal in size without focal abnormality. Adrenals/Urinary Tract: Adrenal glands are unremarkable. Kidneys are normal, without renal calculi, focal lesion, or hydronephrosis. Bladder is unremarkable. Stomach/Bowel: Mild inflammation along the distal gastric antrum. Stomach is otherwise  unremarkable. Reactive inflammatory changes noted along the duodenum. Small bowel and colon are normal in caliber. No wall thickening or additional inflammation. Numerous colonic diverticula. Normal appendix visualized. Vascular/Lymphatic: Aortic atherosclerosis. No aneurysm. No discrete enlarged lymph nodes. Reproductive: Unremarkable. Other: Small fat containing umbilical hernia. Musculoskeletal: No fracture or acute finding.  No bone lesion. IMPRESSION: 1. Interval worsening of complicated/necrotizing pancreatitis, with an interval increase in peripancreatic fluid and inflammation and development adjacent acute necrotic collections. No evidence of venous thrombosis. 2. No other acute abnormality within the abdomen or pelvis. Electronically Signed   By: Lajean Manes M.D.   On: 11/17/2021 13:46        Scheduled Meds:  brimonidine  1 drop Right Eye BID   LORazepam  1 mg Intravenous QHS   metoCLOPramide (REGLAN) injection  10 mg Intravenous Q8H   metoprolol tartrate  5 mg Intravenous Q6H   pantoprazole (PROTONIX) IV  40 mg Intravenous Q12H   Continuous Infusions:  0.9 % NaCl with KCl 20 mEq / L 100 mL/hr at 11/18/21 1208     LOS: 2 days    Time spent: 35 minutes     Darleen Crocker, DO [facility]  If 7PM-7AM, please contact night-coverage [email or web address] 11/18/2021, 2:13 PM

## 2021-11-18 NOTE — Plan of Care (Signed)

## 2021-11-18 NOTE — TOC Progression Note (Signed)
Transition of Care [facility]) - Progression Note    Patient Details  Name: NAEL PETROSYAN MRN: [id number] Date of Birth: [birth year]  Transition of Care [facility]) CM/SW Contact  Salome Arnt, [place] Phone Number: 11/18/2021, 11:04 AM  Clinical Narrative:   Transition of Care [facility]) Screening Note   Patient Details  Name: AADYN BUCHHEIT Date of Birth: [birth year]   Transition of Care [facility]) CM/SW Contact:    Salome Arnt, [place] Phone Number: 11/18/2021, 11:04 AM    Transition of Care Department [facility]) has reviewed patient and no TOC needs have been identified at this time. We will continue to monitor patient advancement through interdisciplinary progression rounds. If new patient transition needs arise, please place a TOC consult.            Expected Discharge Plan and Services                                                 Social Determinants of Health (SDOH) Interventions    Readmission Risk Interventions Readmission Risk Prevention Plan 11/02/2021  Transportation Screening Complete  Home Care Screening Complete  Medication Review (RN CM) Complete  Some recent data might be hidden

## 2021-11-18 NOTE — Progress Notes (Signed)
Patient has not had any difficulty with clear liquids at breakfast and lunch.  He is not nauseated or having regurgitation. Stomach was scanned with bladder scan and there is very little fluid if any. Therefore will advance diet to full liquids. If he fails would consider parenteral nutrition as nasoenteral feeding not very practical.

## 2021-11-19 DIAGNOSIS — K920 Hematemesis: Secondary | ICD-10-CM

## 2021-11-19 LAB — CBC
HCT: 35.4 % — ABNORMAL LOW (ref 39.0–52.0)
Hemoglobin: 11.5 g/dL — ABNORMAL LOW (ref 13.0–17.0)
MCH: 32 pg (ref 26.0–34.0)
MCHC: 32.5 g/dL (ref 30.0–36.0)
MCV: 98.6 fL (ref 80.0–100.0)
Platelets: 489 10*3/uL — ABNORMAL HIGH (ref 150–400)
RBC: 3.59 MIL/uL — ABNORMAL LOW (ref 4.22–5.81)
RDW: 13.3 % (ref 11.5–15.5)
WBC: 13.3 10*3/uL — ABNORMAL HIGH (ref 4.0–10.5)
nRBC: 0 % (ref 0.0–0.2)

## 2021-11-19 LAB — MAGNESIUM: Magnesium: 1.8 mg/dL (ref 1.7–2.4)

## 2021-11-19 LAB — BASIC METABOLIC PANEL
Anion gap: 7 (ref 5–15)
BUN: 14 mg/dL (ref 8–23)
CO2: 22 mmol/L (ref 22–32)
Calcium: 8.4 mg/dL — ABNORMAL LOW (ref 8.9–10.3)
Chloride: 107 mmol/L (ref 98–111)
Creatinine, Ser: 0.8 mg/dL (ref 0.61–1.24)
GFR, Estimated: >60 mL/min (ref >60)
Glucose, Bld: 104 mg/dL — ABNORMAL HIGH (ref 70–99)
Potassium: 4 mmol/L (ref 3.5–5.1)
Sodium: 136 mmol/L (ref 135–145)

## 2021-11-19 MED ORDER — ENSURE ENLIVE PO LIQD
237.0000 mL | Freq: Three times a day (TID) | ORAL | 12 refills | Status: DC
Start: 2021-11-19 — End: 2022-05-17

## 2021-11-19 MED ORDER — ENSURE ENLIVE PO LIQD: 237.0000 mL | Freq: Three times a day (TID) | ORAL | Status: DC

## 2021-11-19 NOTE — TOC Transition Note (Signed)
Transition of Care [facility]) - CM/SW Discharge Note   Patient Details  Name: DAXTIN LEIKER MRN: [id number] Date of Birth: 1946-08-18  Transition of Care [facility]) CM/SW Contact:  Boneta Lucks, RN Phone Number: 11/19/2021, 12:23 PM   Clinical Narrative:   Patient is discharging home. Patient is active with [facility] for PT/OT/RN. MD placed new orders, Marjory Lies updated with DC plan, added to AVS>.  Final next level of care: [place] Barriers to Discharge: Barriers Resolved  Patient Goals and CMS Choice Patient states their goals for this hospitalization and ongoing recovery are:: to go home. CMS Medicare.gov Compare Post Acute Care list provided to:: Patient Choice offered to / list presented to : Patient  Discharge Placement           Patient and family notified of of transfer: 11/19/21  Discharge Plan and Services     Date [place]: 11/19/21 Time [place]: 1223 Representative spoke with at [place]: Marjory Lies   Readmission Risk Interventions Readmission Risk Prevention Plan 11/19/2021 11/02/2021  Transportation Screening Complete Complete  PCP or Specialist Appt within 5-7 Days Complete -  Home Care Screening Complete Complete  Medication Review (RN CM) Complete Complete  Some recent data might be hidden

## 2021-11-19 NOTE — Progress Notes (Signed)
Subjective: No abdominal pain. No N/V. Tolerating full liquids.   Objective: Vital signs in last 24 hours: Temp:  [97.3 F (36.3 C)-98 F (36.7 C)] 97.3 F (36.3 C) (02/27 0543) Pulse Rate:  [74-105] 94 (02/27 0543) Resp:  [18] 18 (02/27 0543) BP: (124-140)/(81-91) 135/86 (02/27 0543) SpO2:  [96 %-98 %] 96 % (02/27 0543) Last BM Date : 11/15/21 General:   Alert and oriented, pleasant Head:  Normocephalic and atraumatic. Abdomen:  Round, obese, Bowel sounds present, soft, non-tender, non-distended. No HSM or hernias noted. No rebound or guarding. No masses appreciated  Extremities:  Without edema. Neurologic:  Alert and  oriented x4 Psych:  Alert and cooperative. Normal mood and affect.  Intake/Output from previous day: 02/26 0701 - 02/27 0700 In: 1920 [P.O.:1920] Out: 1800 [Urine:1800] Intake/Output this shift: Total I/O In: -  Out: 500 [Urine:500]  Lab Results: Recent Labs    11/17/21 0521 11/18/21 0557 11/19/21 0551  WBC 10.8* 9.9 13.3*  HGB 11.9* 11.1* 11.5*  HCT 37.5* 35.8* 35.4*  PLT 532* 495* 489*   BMET Recent Labs    11/17/21 0521 11/18/21 0557 11/19/21 0551  NA 139 139 136  K 3.7 4.3 4.0  CL 106 110 107  CO2 25 21* 22  GLUCOSE 123* 135* 104*  BUN 20 19 14   CREATININE 0.91 0.80 0.80  CALCIUM 8.5* 8.3* 8.4*   LFT Recent Labs    11/17/21 0521  PROT 6.0*  ALBUMIN 2.8*  AST 25  ALT 20  ALKPHOS 58  BILITOT 0.7   Studies/Results: CT ABDOMEN PELVIS W CONTRAST  Result Date: 11/17/2021 CLINICAL DATA:  Recent hospitalization for necrotizing pancreatitis. Follow-up study. EXAM: CT ABDOMEN AND PELVIS WITH CONTRAST TECHNIQUE: Multidetector CT imaging of the abdomen and pelvis was performed using the standard protocol following bolus administration of intravenous contrast. RADIATION DOSE REDUCTION: This exam was performed according to the departmental dose-optimization program which includes automated exposure control, adjustment of the mA and/or  kV according to patient size and/or use of iterative reconstruction technique. CONTRAST:  182mL OMNIPAQUE IOHEXOL 300 MG/ML  SOLN COMPARISON:  11/01/2021. FINDINGS: Lower chest: Small pleural effusions associated with mild dependent lower lobe atelectasis. Atelectasis improved since the prior CT. Hepatobiliary: Liver normal in size and overall attenuation. Tiny low-attenuation lesion in the left lobe, too small to characterize, likely a cyst. No other liver masses or lesions. Normal gallbladder. Common bile duct measures 8 mm, increased when compared to the prior CT. No intrahepatic bile duct dilation. No evidence of a duct stone. Pancreas: Peripancreatic fluid surrounds the pancreas. There are interspersed areas of decreased enhancement along the tail suggesting necrosis. Inflammatory type stranding extends from peripancreatic fluid along the gastro duodenal ligament and anterior pararenal fascia. Lobulated fluid collection is seen extending from the pancreatic tail adjacent to the left anterior pararenal fascia, measuring 8.1 x 2.9 x 3.7 cm, new since the prior CT. There is a second less well-defined fluid collection just anterior and superior to the pancreatic tail, measuring approximately 5.6 x 2.7 x 2.6 cm. Small fluid collections lie adjacent to the pancreatic head. The largest of these, which lies between the duodenum and inferior margin of the liver, is ill-defined, measuring approximately 4.7 x 3.6 x 3.5 cm. Splenic vein is attenuated, but not thrombosed/occluded. Portal vein is widely patent. Superior mesenteric vein is widely patent. Spleen: Normal in size without focal abnormality. Adrenals/Urinary Tract: Adrenal glands are unremarkable. Kidneys are normal, without renal calculi, focal lesion, or hydronephrosis. Bladder is unremarkable.  Stomach/Bowel: Mild inflammation along the distal gastric antrum. Stomach is otherwise unremarkable. Reactive inflammatory changes noted along the duodenum. Small bowel  and colon are normal in caliber. No wall thickening or additional inflammation. Numerous colonic diverticula. Normal appendix visualized. Vascular/Lymphatic: Aortic atherosclerosis. No aneurysm. No discrete enlarged lymph nodes. Reproductive: Unremarkable. Other: Small fat containing umbilical hernia. Musculoskeletal: No fracture or acute finding.  No bone lesion. IMPRESSION: 1. Interval worsening of complicated/necrotizing pancreatitis, with an interval increase in peripancreatic fluid and inflammation and development adjacent acute necrotic collections. No evidence of venous thrombosis. 2. No other acute abnormality within the abdomen or pelvis. Electronically Signed   By: Lajean Manes M.D.   On: 11/17/2021 13:46    Assessment: 76 year old male with history of necrotizing pancreatitis and discharged 11/12/21 after prolonged hospitalization, presenting this admission with hematemesis in setting of Eliquis. EGD on 2/25 with hematin in gastric fundus s/p suctioning, NG trauma in antrum, normal pylorus, and non-bleeding duodenal ulcer without stigmata of bleeding, and duodenitis with non-critical luminal narrowing involving second portion of duodenum. CT 2/25 with contrast revealed interval worsening of complicated/necrotizing pancreatitis, with interval increase in peripancreatic fluid and inflammation and development of adjacent acute necrotic collections.   UGI bleed: secondary to duodenitis and duodenal ulcer. Unable to exclude MW tear.   Duodenal stricture: in setting of pancreatitis and peripancreatic edema. Tolerating fulls. He will go home on this until seen in office in 1 week.   Necrotizing pancreatitis: now with increased fluid collections. Recommend repeat dedicated pancreatic imaging in 6-8 weeks.   History of colonic adenomas: overdue for surveillance. May arrange as outpatient.   Plan: Home today on fulls Keep outpatient follow-up already scheduled in 1 week PPI BID as  outpatient Dedicated pancreatic imaging in 6-8 weeks Consider colonoscopy as outpatient once health permits Hopeful advancement of diet to soft foods in one week   Annitta Needs, PhD, ANP-BC [facility]    LOS: 3 days    11/19/2021, 10:10 AM

## 2021-11-19 NOTE — Care Management Important Message (Signed)
Important Message  Patient Details  Name: Sean Golden MRN: [id number] Date of Birth: 1946-01-26   Medicare Important Message Given:  Yes     Tommy Medal 11/19/2021, 1:15 PM

## 2021-11-19 NOTE — Discharge Summary (Signed)
Physician Discharge Summary  Sean Golden:[id number] DOB: Feb 18, 1946 DOA: 11/16/2021  PCP: Celene Squibb, MD  Admit date: 11/16/2021  Discharge date: 11/19/2021  Admitted From:Home  Disposition:  Home  Recommendations for Outpatient Follow-up:  Follow up with PCP in 1-2 weeks Follow-up with GI as scheduled on 3/7 Okay to resume Eliquis as well as other medications as prescribed  Home Health: None  Equipment/Devices: None  Discharge Condition:Stable  CODE STATUS: Full  Diet recommendation: Full liquid diet with Ensure 3 times daily  Brief/Interim Summary: Per HPI: Sean Golden is a 76 y.o. male with medical history significant of history of afib on Eliquis, essential hypertension, GERD, H. pylori, sleep apnea, obesity, and more who was just discharged 5 days ago after a 16-day hospitalization for necrotizing pancreatitis.  He presented with coffee-ground emesis that began last night.  He states that he has been eating very little and is barely drinking any supplemental shakes.  He vomited multiple times and was noted to have coffee-ground material, but no frank blood.  He has not had any melena or rectal bleed and is noticing some fullness in his epigastric region.  He has been gradually losing weight as a result of his poor oral intake.  His last Eliquis dose was yesterday morning.  Patient denies any other symptoms of chest pain, shortness of breath, fevers, chills, night sweats, or diarrhea.   In the ED, he has been seen by GI and started on PPI IV twice daily and given IV fluid.  Recommendations were to place NG tube and plans are for endoscopy in a.m.  Hemoglobin levels appear stable at 13.9 and he has mild leukocytosis of 11.6.   11/17/2021: Patient has undergone upper endoscopy with findings of nonbleeding duodenal ulcer and no other significant findings noted.  He will remain n.p.o. for now with increasing IV fluids and has been started on scheduled Reglan per GI.  Continue  to monitor a.m. labs.   11/18/21: Patient has been started on clear liquid diet and it remains to be seen if he may be able to discharge on full liquids, otherwise may require TPN and bowel rest.  IV fluid decreased and GI continues to follow.   11/19/2021: Patient has been tolerating full liquids with no abdominal pain or other GI issues at this time.  He has been seen by GI with recommendations to resume his home oral medications to include Eliquis today.  He will remain on full liquid diet with supplementation with Ensure as recommended by dietitian until further follow-up with GI on 3/7.  No other acute events noted throughout the course of this admission and he is stable for discharge.  Discharge Diagnoses:  Principal Problem:   Hematemesis Active Problems:   Essential hypertension   Paroxysmal atrial fibrillation (HCC)   Gastroesophageal reflux disease   Helicobacter pylori gastritis   Mixed hyperlipidemia   Acute necrotizing pancreatitis   Obesity with sleep apnea   Leukocytosis  Principal discharge diagnosis: Coffee-ground emesis secondary to duodenitis and duodenal ulcer along with duodenal stricture resulting in gastric outlet obstruction secondary to pancreatitis and peripancreatic edema.  Discharge Instructions  Discharge Instructions     Diet - low sodium heart healthy   Complete by: As directed    Increase activity slowly   Complete by: As directed       Allergies as of 11/19/2021   No Known Allergies      Medication List     STOP taking these medications  traMADol 50 MG tablet Commonly known as: ULTRAM       TAKE these medications    acetaminophen 325 MG tablet Commonly known as: TYLENOL Take 2 tablets (650 mg total) by mouth every 6 (six) hours as needed for mild pain, fever or headache (or Fever >/= 101).   brimonidine 0.2 % ophthalmic solution Commonly known as: ALPHAGAN Place 1 drop into the right eye 2 (two) times daily.   diltiazem 240 MG  24 hr capsule Commonly known as: CARDIZEM CD Take 1 capsule (240 mg total) by mouth daily.   Eliquis 5 MG Tabs tablet Generic drug: apixaban TAKE ONE TABLET (5MG  TOTAL) BY MOUTH TWOTIMES DAILY What changed: See the new instructions.   feeding supplement Liqd Take 237 mLs by mouth 3 (three) times daily between meals.   furosemide 40 MG tablet Commonly known as: Lasix Take 1 tablet (40 mg total) by mouth every other day.   pantoprazole 40 MG tablet Commonly known as: PROTONIX Take 1 tablet (40 mg total) by mouth 2 (two) times daily.   polyethylene glycol 17 g packet Commonly known as: MIRALAX / GLYCOLAX Take 17 g by mouth daily.   potassium chloride 10 MEQ tablet Commonly known as: KLOR-CON M Take 1 tablet (10 mEq total) by mouth every other day.   tamsulosin 0.4 MG Caps capsule Commonly known as: FLOMAX Take 2 capsules (0.8 mg total) by mouth every evening.        Follow-up Information     Celene Squibb, MD. Go in 1 week(s).   Specialty: Internal Medicine Contact information: [place] [address] [phone]         [place]. Go to.   Contact information: [address] [place] [phone]               No Known Allergies  Consultations: GI   Procedures/Studies: CT CHEST WO CONTRAST  Result Date: 10/30/2021 CLINICAL DATA:  Pneumonia, complication suspected. Respiratory distress, hypoxia, tachypnea. EXAM: CT CHEST WITHOUT CONTRAST TECHNIQUE: Multidetector CT imaging of the chest was performed following the standard protocol without IV contrast. RADIATION DOSE REDUCTION: This exam was performed according to the departmental dose-optimization program which includes automated exposure control, adjustment of the mA and/or kV according to patient size and/or use of iterative reconstruction technique. COMPARISON:  10/28/2021 FINDINGS: Cardiovascular: Mild aortic and coronary  artery calcifications. No aortic aneurysm. Normal heart size. No pericardial effusions. Mediastinum/Nodes: Thyroid gland is unremarkable. Scattered mediastinal lymph nodes are not pathologically enlarged. Esophagus is decompressed. Lungs/Pleura: New development of bilateral basilar atelectasis or consolidation. No pleural effusions. No pneumothorax. Airways are patent. Upper Abdomen: Inflammatory changes again demonstrated in the visualized pancreas although incompletely included. There is hazy stranding around the pancreatic head and body with soft tissue gas. The gas collections are new since prior study and may represent necrotizing infection or fistula. CT abdomen and pelvis suggested for further evaluation. Musculoskeletal: Degenerative changes in the spine. IMPRESSION: 1. New development of bilateral basilar consolidation or atelectasis in the lungs. 2. Incomplete visualization of changes of pancreatitis in the upper abdomen. New finding of peripancreatic gas suggesting necrotizing infection or fistula. CT abdomen and pelvis suggested for further evaluation. 3. Aortic atherosclerosis. Electronically Signed   By: Lucienne Capers M.D.   On: 10/30/2021 20:00   CT ABDOMEN PELVIS W CONTRAST  Result Date: 11/17/2021 CLINICAL DATA:  Recent hospitalization for necrotizing pancreatitis. Follow-up study. EXAM: CT ABDOMEN AND PELVIS WITH CONTRAST TECHNIQUE:  Multidetector CT imaging of the abdomen and pelvis was performed using the standard protocol following bolus administration of intravenous contrast. RADIATION DOSE REDUCTION: This exam was performed according to the departmental dose-optimization program which includes automated exposure control, adjustment of the mA and/or kV according to patient size and/or use of iterative reconstruction technique. CONTRAST:  129mL OMNIPAQUE IOHEXOL 300 MG/ML  SOLN COMPARISON:  11/01/2021. FINDINGS: Lower chest: Small pleural effusions associated with mild dependent lower lobe  atelectasis. Atelectasis improved since the prior CT. Hepatobiliary: Liver normal in size and overall attenuation. Tiny low-attenuation lesion in the left lobe, too small to characterize, likely a cyst. No other liver masses or lesions. Normal gallbladder. Common bile duct measures 8 mm, increased when compared to the prior CT. No intrahepatic bile duct dilation. No evidence of a duct stone. Pancreas: Peripancreatic fluid surrounds the pancreas. There are interspersed areas of decreased enhancement along the tail suggesting necrosis. Inflammatory type stranding extends from peripancreatic fluid along the gastro duodenal ligament and anterior pararenal fascia. Lobulated fluid collection is seen extending from the pancreatic tail adjacent to the left anterior pararenal fascia, measuring 8.1 x 2.9 x 3.7 cm, new since the prior CT. There is a second less well-defined fluid collection just anterior and superior to the pancreatic tail, measuring approximately 5.6 x 2.7 x 2.6 cm. Small fluid collections lie adjacent to the pancreatic head. The largest of these, which lies between the duodenum and inferior margin of the liver, is ill-defined, measuring approximately 4.7 x 3.6 x 3.5 cm. Splenic vein is attenuated, but not thrombosed/occluded. Portal vein is widely patent. Superior mesenteric vein is widely patent. Spleen: Normal in size without focal abnormality. Adrenals/Urinary Tract: Adrenal glands are unremarkable. Kidneys are normal, without renal calculi, focal lesion, or hydronephrosis. Bladder is unremarkable. Stomach/Bowel: Mild inflammation along the distal gastric antrum. Stomach is otherwise unremarkable. Reactive inflammatory changes noted along the duodenum. Small bowel and colon are normal in caliber. No wall thickening or additional inflammation. Numerous colonic diverticula. Normal appendix visualized. Vascular/Lymphatic: Aortic atherosclerosis. No aneurysm. No discrete enlarged lymph nodes. Reproductive:  Unremarkable. Other: Small fat containing umbilical hernia. Musculoskeletal: No fracture or acute finding.  No bone lesion. IMPRESSION: 1. Interval worsening of complicated/necrotizing pancreatitis, with an interval increase in peripancreatic fluid and inflammation and development adjacent acute necrotic collections. No evidence of venous thrombosis. 2. No other acute abnormality within the abdomen or pelvis. Electronically Signed   By: Lajean Manes M.D.   On: 11/17/2021 13:46   US RENAL  Result Date: 10/29/2021 CLINICAL DATA:  Acute kidney injury EXAM: RENAL / URINARY TRACT ULTRASOUND COMPLETE COMPARISON:  CT October 28, 2021 FINDINGS: Right Kidney: Renal measurements: 11.8 x 6.1 x 5.5 cm = volume: 207 mL. Echogenicity within normal limits. No mass or hydronephrosis visualized. Left Kidney: Renal measurements: 12.7 x 6.6 x 6.3 cm = volume: 275 mL. Echogenicity within normal limits. No mass or hydronephrosis visualized. Bladder: Not visualized. Other: None. IMPRESSION: No hydronephrosis. Electronically Signed   By: Dahlia Bailiff M.D.   On: 10/29/2021 11:46   CT ABDOMEN LIMITED WO CONTRAST  Result Date: 11/01/2021 CLINICAL DATA:  Necrotizing pancreatitis, foci of gas in pancreatic bed and in anterior pararenal spaces bilaterally greater on RIGHT, question due to necrotizing pancreatitis versus bowel/duodenal perforation EXAM: CT ABDOMEN WITHOUT CONTRAST LIMITED TECHNIQUE: Multidetector CT imaging of the abdomen was performed following the standard protocol without IV contrast. Patient drank water-soluble contrast for this exam. RADIATION DOSE REDUCTION: This exam was performed according to the departmental dose-optimization  program which includes automated exposure control, adjustment of the mA and/or kV according to patient size and/or use of iterative reconstruction technique. COMPARISON:  Earlier study 11/01/2021 FINDINGS: Lower chest: Bibasilar pleural effusions and atelectasis Hepatobiliary: Gallbladder  and liver normal appearance Pancreas: Significantly enlarged and edematous pancreas with diffuse infiltration of peripancreatic fat planes consistent with acute pancreatitis. Few foci of gas again seen at the pancreatic head/body region. No abnormal fluid collections. No mass or definite hemorrhage. Spleen: Normal appearance Adrenals/Urinary Tract: Adrenal glands, kidneys, and proximal ureters unremarkable. Stomach/Bowel: Contrast opacifies the stomach, duodenal bulb, proximal descending duodenum, and third portion of duodenum as well as multiple jejunal loops. Bowel wall thickening of second and third portions of duodenum. No contrast extravasation is identified into the peripancreatic tissue planes or the anterior pararenal space to suggest duodenal perforation/ulcer. Diverticulosis of descending and distal transverse colon noted. Vascular/Lymphatic: Atherosclerotic calcifications aorta. No adenopathy. Other: No free intraperitoneal air.  No hernia. Musculoskeletal: Osseous structures unremarkable. IMPRESSION: No extravasation of GI contrast into pancreatic bed or anterior pararenal spaces to suggest duodenal perforation. Persistent visualization of edema and foci of gas at the pancreatic head/body and in the anterior pararenal spaces bilaterally greater on RIGHT, likely reflecting necrotizing pancreatitis. Severe pancreatitis changes again seen. Electronically Signed   By: Lavonia Dana M.D.   On: 11/01/2021 16:38   DG CHEST PORT 1 VIEW  Result Date: 10/30/2021 CLINICAL DATA:  Hypoxia EXAM: PORTABLE CHEST 1 VIEW COMPARISON:  10/29/2021 FINDINGS: Bibasilar opacities, likely atelectasis. Suspect small effusions. Heart is normal size. No acute bony abnormality. IMPRESSION: Small bilateral effusions with bibasilar atelectasis. Electronically Signed   By: Rolm Baptise M.D.   On: 10/30/2021 01:04   DG Chest Port 1 View  Result Date: 10/29/2021 CLINICAL DATA:  Abdominal pain and shortness of breath, pancreatitis  EXAM: PORTABLE CHEST 1 VIEW COMPARISON:  Portable exam 0914 hours compared to 10/27/2021 FINDINGS: Normal heart size, mediastinal contours, and pulmonary vascularity. Bibasilar atelectasis. Remaining lungs clear. No definite pleural effusion or pneumothorax. IMPRESSION: Bibasilar atelectasis. Electronically Signed   By: Lavonia Dana M.D.   On: 10/29/2021 09:23   DG Chest Port 1 View  Result Date: 10/27/2021 CLINICAL DATA:  Epigastric pain. EXAM: PORTABLE CHEST 1 VIEW COMPARISON:  08/17/2009 FINDINGS: Shallow inspiration with atelectasis in the lung bases. Heart size and pulmonary vascularity are normal. No airspace disease or consolidation. Mediastinal contours appear intact. IMPRESSION: Shallow inspiration with atelectasis in the bases. Electronically Signed   By: Lucienne Capers M.D.   On: 10/27/2021 23:34   DG Abd Portable 1V  Result Date: 10/28/2021 CLINICAL DATA:  Abdominal pain. EXAM: PORTABLE ABDOMEN - 1 VIEW COMPARISON:  CTA chest abdomen pelvis 7 hours ago. FINDINGS: No gaseous bowel dilatation to suggest obstruction. Contrast excretion noted both kidneys with accumulation of contrast in the bladder compatible with CT performed earlier today. Visualized bony anatomy shows no acute finding. IMPRESSION: Negative. Electronically Signed   By: Misty Stanley M.D.   On: 10/28/2021 08:57   ECHOCARDIOGRAM COMPLETE  Result Date: 10/31/2021    ECHOCARDIOGRAM REPORT   Patient Name:   LAMONE FERRELLI Date of Exam: 10/31/2021 Medical Rec #:  [id number]      Height:       70.0 in Accession #:    [id number]     Weight:       273.1 lb Date of Birth:  12-30-1945     BSA:          2.383 m Patient Age:  75 years       BP:           118/62 mmHg Patient Gender: M              HR:           113 bpm. Exam Location:  [facility] Procedure: 2D Echo, Cardiac Doppler and Color Doppler Indications:    Dyspnea  History:        Patient has prior history of Echocardiogram examinations, most                 recent 06/09/2014.  Arrythmias:Atrial Fibrillation and Atrial                 Flutter; Risk Factors:Hypertension, Dyslipidemia and Former                 Smoker.  Sonographer:    Wenda Low Referring Phys: [id number] Charlesetta Ivory GONFA  Sonographer Comments: Patient is morbidly obese. Image acquisition challenging due to respiratory motion. Patient is on Bi-Pap IMPRESSIONS  1. Left ventricular ejection fraction, by estimation, is 60 to 65%. The left ventricle has normal function. The left ventricle has no regional wall motion abnormalities. There is moderate left ventricular hypertrophy. Left ventricular diastolic parameters are indeterminate.  2. Right ventricular systolic function is normal. The right ventricular size is normal. There is normal pulmonary artery systolic pressure.  3. Left atrial size was mildly dilated.  4. Prominent epicardial adipose tissue.  5. The mitral valve is normal in structure. No evidence of mitral valve regurgitation. No evidence of mitral stenosis.  6. The aortic valve is tricuspid. There is mild calcification of the aortic valve. Aortic valve regurgitation is not visualized. Aortic valve sclerosis is present, with no evidence of aortic valve stenosis.  7. The inferior vena cava is normal in size with greater than 50% respiratory variability, suggesting right atrial pressure of 3 mmHg. FINDINGS  Left Ventricle: Left ventricular ejection fraction, by estimation, is 60 to 65%. The left ventricle has normal function. The left ventricle has no regional wall motion abnormalities. The left ventricular internal cavity size was normal in size. There is  moderate left ventricular hypertrophy. Left ventricular diastolic parameters are indeterminate. Right Ventricle: The right ventricular size is normal. No increase in right ventricular wall thickness. Right ventricular systolic function is normal. There is normal pulmonary artery systolic pressure. The tricuspid regurgitant velocity is 2.36 m/s, and  with an assumed  right atrial pressure of 3 mmHg, the estimated right ventricular systolic pressure is 18.2 mmHg. Left Atrium: Left atrial size was mildly dilated. Right Atrium: Right atrial size was normal in size. Pericardium: Prominent epicardial adipose tissue. There is no evidence of pericardial effusion. Mitral Valve: The mitral valve is normal in structure. No evidence of mitral valve regurgitation. No evidence of mitral valve stenosis. MV peak gradient, 5.5 mmHg. The mean mitral valve gradient is 2.0 mmHg. Tricuspid Valve: The tricuspid valve is normal in structure. Tricuspid valve regurgitation is not demonstrated. No evidence of tricuspid stenosis. Aortic Valve: The aortic valve is tricuspid. There is mild calcification of the aortic valve. Aortic valve regurgitation is not visualized. Aortic valve sclerosis is present, with no evidence of aortic valve stenosis. Aortic valve mean gradient measures 4.5 mmHg. Aortic valve peak gradient measures 10.3 mmHg. Aortic valve area, by VTI measures 2.73 cm. Pulmonic Valve: The pulmonic valve was normal in structure. Pulmonic valve regurgitation is not visualized. No evidence of pulmonic stenosis. Aorta: The aortic root is  normal in size and structure. Venous: The inferior vena cava is normal in size with greater than 50% respiratory variability, suggesting right atrial pressure of 3 mmHg. IAS/Shunts: No atrial level shunt detected by color flow Doppler.  LEFT VENTRICLE PLAX 2D LVIDd:         3.50 cm   Diastology LVIDs:         2.10 cm   LV e' medial:    15.40 cm/s LV PW:         1.20 cm   LV E/e' medial:  7.5 LV IVS:        1.50 cm   LV e' lateral:   13.30 cm/s LVOT diam:     2.10 cm   LV E/e' lateral: 8.7 LV SV:         71 LV SV Index:   30 LVOT Area:     3.46 cm  RIGHT VENTRICLE RV Basal diam:  3.55 cm RV Mid diam:    2.70 cm RV S prime:     16.43 cm/s LEFT ATRIUM             Index        RIGHT ATRIUM           Index LA diam:        4.30 cm 1.80 cm/m   RA Area:     22.00 cm LA  Vol (A2C):   54.0 ml 22.66 ml/m  RA Volume:   69.50 ml  29.17 ml/m LA Vol (A4C):   69.4 ml 29.12 ml/m LA Biplane Vol: 67.5 ml 28.33 ml/m  AORTIC VALVE                     PULMONIC VALVE AV Area (Vmax):    2.72 cm      PV Vmax:       1.08 m/s AV Area (Vmean):   2.67 cm      PV Peak grad:  4.7 mmHg AV Area (VTI):     2.73 cm AV Vmax:           160.50 cm/s AV Vmean:          100.300 cm/s AV VTI:            0.260 m AV Peak Grad:      10.3 mmHg AV Mean Grad:      4.5 mmHg LVOT Vmax:         126.00 cm/s LVOT Vmean:        77.200 cm/s LVOT VTI:          0.205 m LVOT/AV VTI ratio: 0.79  AORTA Ao Root diam: 3.20 cm MITRAL VALVE                TRICUSPID VALVE MV Area (PHT): 3.17 cm     TR Peak grad:   22.3 mmHg MV Area VTI:   3.48 cm     TR Vmax:        236.00 cm/s MV Peak grad:  5.5 mmHg MV Mean grad:  2.0 mmHg     SHUNTS MV Vmax:       1.17 m/s     Systemic VTI:  0.20 m MV Vmean:      65.2 cm/s    Systemic Diam: 2.10 cm MV Decel Time: 239 msec MV E velocity: 116.00 cm/s Jenkins Rouge MD Electronically signed by Jenkins Rouge MD Signature Date/Time: 10/31/2021/10:20:51 AM    Final    CT PANCREAS  ABD W/WO  Result Date: 11/01/2021 CLINICAL DATA:  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 EXAM: CT ABDOMEN WITHOUT AND WITH CONTRAST TECHNIQUE: Multidetector CT imaging of the abdomen was performed following the standard protocol before and following the bolus administration of intravenous contrast. RADIATION DOSE REDUCTION: This exam was performed according to the departmental dose-optimization program which includes automated exposure control, adjustment of the mA and/or kV according to patient size and/or use of iterative reconstruction technique. CONTRAST:  139mL OMNIPAQUE IOHEXOL 300 MG/ML  SOLN COMPARISON:  Recent imaging from October 28, 2021. FINDINGS: Lower chest: Small bilateral effusions and basilar volume loss. Hepatobiliary: Hepatic steatosis with lobular hepatic contours. No focal, suspicious hepatic lesion. Hepatic  arterial supply derive from the celiac axis and SMA with replaced RIGHT hepatic arterial supply arising from the SMA. Gallbladder with mild surrounding stranding felt to be secondary from pancreatic and retroperitoneal process. The portal vein or remains patent as does the splenic vein. Pancreas: Marked interstitial edematous pancreatitis. Note that the pancreatic parenchyma is poorly enhancing but does enhance, scattered areas of non enhancement are noted. There is extensive surrounding inflammation. No large hematoma. There is however gas that has developed in the LEFT and RIGHT retroperitoneum greatest on the RIGHT. Gas along the celiac and about the neck of the pancreas largely sparing the pancreas proper is new compared to February 5th and incompletely imaged on the recent comparison evaluation. Spleen: Normal. Adrenals/Urinary Tract: Adrenal glands are normal. Symmetric renal enhancement without hydronephrosis. Stomach/Bowel: Duodenal inflammation persists with surrounding stranding further extending into retroperitoneal fascial planes and associated with gas as described. No signs of pneumatosis. There also very small locules of gas anterior to the duodenum (image 92/11). No pneumoperitoneum. Vascular/Lymphatic: Atherosclerotic changes of the abdominal aorta. No adenopathy in the retroperitoneum. Other: Retroperitoneal gas as described above. Greatest collection of gas along the RIGHT flank posterior to the colon measuring 5.0 x 2.9 cm. Extensive inflammation with worsening extending throughout retroperitoneal fascial planes. Extensive body wall edema. Musculoskeletal: No acute bone finding. No destructive bone process. Spinal degenerative changes. IMPRESSION: 1. 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 2. Dissemination of inflammation throughout the retroperitoneum with gas greater on the RIGHT than the LEFT. Findings are suspicious for  infection of peripancreatic necrosis, given rapid development aggressive or necrotizing infection is considered and should be correlated with the patient's clinical condition. 3. Given small locules of gas adjacent to the duodenum and the presence of gas in the retroperitoneum would consider follow-up imaging with enteric contrast media utilizing CT to exclude the possibility of a GI source of gas from bowel compromise particularly of the duodenum. 4. Hepatic steatosis with lobular hepatic contours. 5. Small bilateral effusions and basilar volume loss. 6. Aortic atherosclerosis. Aortic Atherosclerosis (ICD10-I70.0). These results will be called to the ordering clinician or representative by the Radiologist Assistant, and communication documented in the PACS or [employer]. Electronically Signed   By: Zetta Bills M.D.   On: 11/01/2021 12:11   CT Angio Chest/Abd/Pel for Dissection W and/or Wo Contrast  Result Date: 10/28/2021 CLINICAL DATA:  Dyspnea, epigastric abdominal pain EXAM: CT ANGIOGRAPHY CHEST, ABDOMEN AND PELVIS TECHNIQUE: Non-contrast CT of the chest was initially obtained. Multidetector CT imaging through the chest, abdomen and pelvis was performed using the standard protocol during bolus administration of intravenous contrast. Multiplanar reconstructed images and MIPs were obtained and reviewed to evaluate the vascular anatomy. RADIATION DOSE REDUCTION: This exam was performed according to the departmental dose-optimization program which  includes automated exposure control, adjustment of the mA and/or kV according to patient size and/or use of iterative reconstruction technique. CONTRAST:  162mL OMNIPAQUE IOHEXOL 350 MG/ML SOLN COMPARISON:  None. FINDINGS: CTA CHEST FINDINGS Cardiovascular: The thoracic aorta is normal in course and caliber; no intramural hematoma, dissection, or aneurysm. Minimal atherosclerotic calcification. No significant coronary artery calcification. Global cardiac size  within normal limits. No pericardial effusion. Central pulmonary arteries are of normal caliber. Mediastinum/Nodes: Visualized thyroid is unremarkable. No pathologic thoracic adenopathy. The esophagus is fluid-filled, likely reflecting the sequela of gastroesophageal reflux given gastric distension. Lungs/Pleura: Mild bilateral dependent atelectasis. The lungs are otherwise clear. No pneumothorax or pleural effusion. Musculoskeletal: No acute bone abnormality Review of the MIP images confirms the above findings. CTA ABDOMEN AND PELVIS FINDINGS VASCULAR Aorta: Normal caliber. No aneurysm or dissection. Mild atherosclerotic calcification. No periaortic inflammatory change. Celiac: Unremarkable SMA: Replaced right hepatic artery.  Otherwise unremarkable. Renals: Dual renal arteries are noted bilaterally. Atherosclerotic calcification results in less than 50% stenoses of the dominant renal arteries at their origins bilaterally. Normal vascular morphology. No aneurysm or dissection. IMA: Widely patent. Inflow: Widely patent. No aneurysm or dissection. Internal iliac arteries are patent bilaterally. Veins: No obvious venous abnormality within the limitations of this arterial phase study. Review of the MIP images confirms the above findings. NON-VASCULAR Hepatobiliary: No focal liver abnormality is seen. No gallstones, gallbladder wall thickening, or biliary dilatation. Pancreas: There is extensive peripancreatic inflammatory stranding with inflammatory fluid tracking into the mesenteric root and into the right anterior pararenal space. Normal enhancement of the pancreatic parenchyma. No loculated intrapancreatic or peripancreatic fluid collections. The pancreatic duct is not dilated. Altogether, the findings are in keeping with changes of acute interstitial/edematous pancreatitis. Spleen: Unremarkable. Adrenals/Urinary Tract: Adrenal glands are unremarkable. Kidneys are normal, without renal calculi, focal lesion, or  hydronephrosis. Bladder is unremarkable. Stomach/Bowel: The stomach is distended and fluid filled suggesting changes of gastric outlet obstruction. There is hyperemia of the duodenum as well as extensive periduodenal inflammatory stranding likely related to the adjacent inflammatory process involving the pancreas. The small bowel is unremarkable. There is extensive distal transverse, descending, and sigmoid colonic diverticulosis as well as ascending colonic diverticulosis. No superimposed acute inflammatory change. Appendix normal. No free intraperitoneal gas or fluid. Lymphatic: No pathologic adenopathy. Reproductive: Prostate is unremarkable. Other: Small bilateral fat containing inguinal hernias and umbilical hernia are identified. Musculoskeletal: Degenerative changes are seen within the lumbar spine. No lytic or blastic bone lesion. No acute bone abnormality. Review of the MIP images confirms the above findings. IMPRESSION: No evidence of aortic dissection or aneurysm. Mild atherosclerotic calcification. No evidence of hemodynamically significant stenosis involving the visceral arterial vasculature or lower extremity arterial inflow. Findings in keeping with acute interstitial/edematous pancreatitis. No evidence of pancreatic or peripancreatic necrosis at this time. Duodenal hyperemia likely related to the adjacent inflammatory process. Fluid distension of the stomach likely related to gastric outlet obstruction secondary to the adjacent inflammatory process. Fluid within the distal esophagus in keeping with gastroesophageal reflux. Aortic Atherosclerosis (ICD10-I70.0). Electronically Signed   By: Fidela Salisbury M.D.   On: 10/28/2021 01:31   US Abdomen Limited RUQ (LIVER/GB)  Result Date: 10/28/2021 CLINICAL DATA:  Acute pancreatitis. EXAM: ULTRASOUND ABDOMEN LIMITED RIGHT UPPER QUADRANT COMPARISON:  CT 10/28/2021 FINDINGS: Gallbladder: No gallstones or wall thickening visualized. No sonographic Murphy sign  noted by sonographer. Common bile duct: Diameter: 5.5 mm.  No intrahepatic bile duct dilatation. Liver: The liver has a heterogeneous echotexture and slightly irregular contour.  No focal liver abnormality identified. Trace perihepatic ascites portal vein is patent on color Doppler imaging with normal direction of blood flow towards the liver. Other: None. IMPRESSION: 1. No gallstones or signs of acute cholecystitis. 2. Echogenic liver compatible with hepatic steatosis 3. Trace perihepatic free fluid Electronically Signed   By: Kerby Moors M.D.   On: 10/28/2021 09:41     Discharge Exam: Vitals:   11/18/21 2007 11/19/21 0543  BP: 124/81 135/86  Pulse: 74 94  Resp: 18 18  Temp: 98 F (36.7 C) (!) 97.3 F (36.3 C)  SpO2: 98% 96%   Vitals:   11/18/21 0507 11/18/21 1405 11/18/21 2007 11/19/21 0543  BP: 129/87 (!) 140/91 124/81 135/86  Pulse: (!) 102 (!) 105 74 94  Resp: 19 18 18 18   Temp: 98.5 F (36.9 C) 97.7 F (36.5 C) 98 F (36.7 C) (!) 97.3 F (36.3 C)  TempSrc: Oral Oral Oral Oral  SpO2: 97% 98% 98% 96%  Weight:      Height:        General: Pt is alert, awake, not in acute distress Cardiovascular: RRR, S1/S2 +, no rubs, no gallops Respiratory: CTA bilaterally, no wheezing, no rhonchi Abdominal: Soft, NT, ND, bowel sounds + Extremities: no edema, no cyanosis    The results of significant diagnostics from this hospitalization (including imaging, microbiology, ancillary and laboratory) are listed below for reference.     Microbiology: Recent Results (from the past 240 hour(s))  Resp Panel by RT-PCR (Flu A&B, Covid) Nasopharyngeal Swab     Status: None   Collection Time: 11/16/21  7:27 AM   Specimen: Nasopharyngeal Swab; Nasopharyngeal(NP) swabs in vial transport medium  Result Value Ref Range Status   SARS Coronavirus 2 by RT PCR NEGATIVE NEGATIVE Final    Comment: (NOTE) SARS-CoV-2 target nucleic acids are NOT DETECTED.  The SARS-CoV-2 RNA is generally detectable  in upper respiratory specimens during the acute phase of infection. The lowest concentration of SARS-CoV-2 viral copies this assay can detect is 138 copies/mL. A negative result does not preclude SARS-Cov-2 infection and should not be used as the sole basis for treatment or other patient management decisions. A negative result may occur with  improper specimen collection/handling, submission of specimen other than nasopharyngeal swab, presence of viral mutation(s) within the areas targeted by this assay, and inadequate number of viral copies(<138 copies/mL). A negative result must be combined with clinical observations, patient history, and epidemiological information. The expected result is Negative.  Fact Sheet for Patients:  EntrepreneurPulse.com.au  Fact Sheet for Healthcare Providers:  IncredibleEmployment.be  This test is no t yet approved or cleared by the [place] FDA and  has been authorized for detection and/or diagnosis of SARS-CoV-2 by FDA under an Emergency Use Authorization (EUA). This EUA will remain  in effect (meaning this test can be used) for the duration of the COVID-19 declaration under Section 564(b)(1) of the Act, 21 U.S.C.section 360bbb-3(b)(1), unless the authorization is terminated  or revoked sooner.       Influenza A by PCR NEGATIVE NEGATIVE Final   Influenza B by PCR NEGATIVE NEGATIVE Final    Comment: (NOTE) The Xpert Xpress SARS-CoV-2/FLU/RSV plus assay is intended as an aid in the diagnosis of influenza from Nasopharyngeal swab specimens and should not be used as a sole basis for treatment. Nasal washings and aspirates are unacceptable for Xpert Xpress SARS-CoV-2/FLU/RSV testing.  Fact Sheet for Patients: EntrepreneurPulse.com.au  Fact Sheet for Healthcare Providers: IncredibleEmployment.be  This test is not  yet approved or cleared by the [place] and has  been authorized for detection and/or diagnosis of SARS-CoV-2 by FDA under an Emergency Use Authorization (EUA). This EUA will remain in effect (meaning this test can be used) for the duration of the COVID-19 declaration under Section 564(b)(1) of the Act, 21 U.S.C. section 360bbb-3(b)(1), unless the authorization is terminated or revoked.  Performed at [facility], [address] [place], [place] [address]      Labs: BNP (last 3 results) Recent Labs    10/29/21 0800 10/30/21 0111 10/30/21 0602  BNP 316.0* 80.0 62.1   Basic Metabolic Panel: Recent Labs  Lab 11/16/21 0727 11/17/21 0521 11/18/21 0557 11/19/21 0551  NA 136 139 139 136  K 3.6 3.7 4.3 4.0  CL 100 106 110 107  CO2 23 25 21* 22  GLUCOSE 162* 123* 135* 104*  BUN 14 20 19 14   CREATININE 0.83 0.91 0.80 0.80  CALCIUM 8.9 8.5* 8.3* 8.4*  MG  --  2.1 2.0 1.8   Liver Function Tests: Recent Labs  Lab 11/16/21 0727 11/17/21 0521  AST 28 25  ALT 24 20  ALKPHOS 73 58  BILITOT 0.7 0.7  PROT 7.3 6.0*  ALBUMIN 3.3* 2.8*   Recent Labs  Lab 11/16/21 0727  LIPASE 40   No results for input(s): AMMONIA in the last 168 hours. CBC: Recent Labs  Lab 11/16/21 0727 11/17/21 0521 11/18/21 0557 11/19/21 0551  WBC 11.6* 10.8* 9.9 13.3*  NEUTROABS 9.0*  --   --   --   HGB 13.9 11.9* 11.1* 11.5*  HCT 42.1 37.5* 35.8* 35.4*  MCV 95.5 97.2 97.8 98.6  PLT 631* 532* 495* 489*   Cardiac Enzymes: No results for input(s): CKTOTAL, CKMB, CKMBINDEX, TROPONINI in the last 168 hours. BNP: Invalid input(s): POCBNP CBG: No results for input(s): GLUCAP in the last 168 hours. D-Dimer No results for input(s): DDIMER in the last 72 hours. Hgb A1c No results for input(s): HGBA1C in the last 72 hours. Lipid Profile No results for input(s): CHOL, HDL, LDLCALC, TRIG, CHOLHDL, LDLDIRECT in the last 72 hours. Thyroid function studies No results for input(s): TSH, T4TOTAL, T3FREE, THYROIDAB in the last 72 hours.  Invalid  input(s): FREET3 Anemia work up No results for input(s): VITAMINB12, FOLATE, FERRITIN, TIBC, IRON, RETICCTPCT in the last 72 hours. Urinalysis    Component Value Date/Time   COLORURINE YELLOW 05/22/2021 1707   APPEARANCEUR HAZY (A) 05/22/2021 1707   LABSPEC 1.017 05/22/2021 1707   PHURINE 5.0 05/22/2021 1707   GLUCOSEU NEGATIVE 05/22/2021 1707   HGBUR NEGATIVE 05/22/2021 1707   BILIRUBINUR NEGATIVE 05/22/2021 1707   KETONESUR NEGATIVE 05/22/2021 1707   PROTEINUR NEGATIVE 05/22/2021 1707   NITRITE NEGATIVE 05/22/2021 1707   LEUKOCYTESUR NEGATIVE 05/22/2021 1707   Sepsis Labs Invalid input(s): PROCALCITONIN,  WBC,  LACTICIDVEN Microbiology Recent Results (from the past 240 hour(s))  Resp Panel by RT-PCR (Flu A&B, Covid) Nasopharyngeal Swab     Status: None   Collection Time: 11/16/21  7:27 AM   Specimen: Nasopharyngeal Swab; Nasopharyngeal(NP) swabs in vial transport medium  Result Value Ref Range Status   SARS Coronavirus 2 by RT PCR NEGATIVE NEGATIVE Final    Comment: (NOTE) SARS-CoV-2 target nucleic acids are NOT DETECTED.  The SARS-CoV-2 RNA is generally detectable in upper respiratory specimens during the acute phase of infection. The lowest concentration of SARS-CoV-2 viral copies this assay can detect is 138 copies/mL. A negative result does not preclude SARS-Cov-2 infection and should not  be used as the sole basis for treatment or other patient management decisions. A negative result may occur with  improper specimen collection/handling, submission of specimen other than nasopharyngeal swab, presence of viral mutation(s) within the areas targeted by this assay, and inadequate number of viral copies(<138 copies/mL). A negative result must be combined with clinical observations, patient history, and epidemiological information. The expected result is Negative.  Fact Sheet for Patients:  EntrepreneurPulse.com.au  Fact Sheet for Healthcare Providers:   IncredibleEmployment.be  This test is no t yet approved or cleared by the [place] FDA and  has been authorized for detection and/or diagnosis of SARS-CoV-2 by FDA under an Emergency Use Authorization (EUA). This EUA will remain  in effect (meaning this test can be used) for the duration of the COVID-19 declaration under Section 564(b)(1) of the Act, 21 U.S.C.section 360bbb-3(b)(1), unless the authorization is terminated  or revoked sooner.       Influenza A by PCR NEGATIVE NEGATIVE Final   Influenza B by PCR NEGATIVE NEGATIVE Final    Comment: (NOTE) The Xpert Xpress SARS-CoV-2/FLU/RSV plus assay is intended as an aid in the diagnosis of influenza from Nasopharyngeal swab specimens and should not be used as a sole basis for treatment. Nasal washings and aspirates are unacceptable for Xpert Xpress SARS-CoV-2/FLU/RSV testing.  Fact Sheet for Patients: EntrepreneurPulse.com.au  Fact Sheet for Healthcare Providers: IncredibleEmployment.be  This test is not yet approved or cleared by the [place] FDA and has been authorized for detection and/or diagnosis of SARS-CoV-2 by FDA under an Emergency Use Authorization (EUA). This EUA will remain in effect (meaning this test can be used) for the duration of the COVID-19 declaration under Section 564(b)(1) of the Act, 21 U.S.C. section 360bbb-3(b)(1), unless the authorization is terminated or revoked.  Performed at [facility], [address], [place], [place] [address]      Time coordinating discharge: 35 minutes  SIGNED:   Rodena Goldmann, DO [facility] 11/19/2021, 12:20 PM  If 7PM-7AM, please contact night-coverage [email or web address]

## 2021-11-19 NOTE — Progress Notes (Signed)
Nutrition Follow-up  DOCUMENTATION CODES:   Obesity unspecified  INTERVENTION:  - Encourage adequate nutritional intake - Ensure Enlive po TID, each supplement provides 350 kcal and 20 grams of protein. - Provided and discussed "Full Liquid Diet Nutrition Therapy" handout  NUTRITION DIAGNOSIS:   Inadequate oral intake related to altered GI function as evidenced by NPO status.  Improving- pt now on full liquid diet  GOAL:   Patient will meet greater than or equal to 90% of their needs  Improving  MONITOR:   Diet advancement, Labs, Weight trends, Skin, I & O's  REASON FOR ASSESSMENT:   Consult, Malnutrition Screening Tool Assessment of nutrition requirement/status (pt needs FLD diet education)  ASSESSMENT:   Sean Golden is a 76 y.o. male with medical history significant of history of afib on Eliquis, essential hypertension, GERD, H. pylori, sleep apnea, obesity, and more who was just discharged 5 days ago after a 16-day hospitalization for necrotizing pancreatitis.  He presented with coffee-ground emesis that began last night.  He states that he has been eating very little and is barely drinking any supplemental shakes.  He vomited multiple times and was noted to have coffee-ground material, but no frank blood.  He has not had any melena or rectal bleed and is noticing some fullness in his epigastric region.  He has been gradually losing weight as a result of his poor oral intake.  His last Eliquis dose was yesterday morning.  Patient denies any other symptoms of chest pain, shortness of breath, fevers, chills, night sweats, or diarrhea.  2/25: upper EGD- nonbleeding duodenal ulcer  Spoke with pt at bedside. He was d/c from hospital last Monday. At home he was unable to eat much d/t feeling full and began experiencing emesis on Friday which lead to this admission. He reports feeling much better now after temporary placement of NG tube to LIS. After last d/c he purchased Ensure  to drink at home. Currently on a full liquid diet and is tolerating well.  Provided "Full Liquid Diet Nutrition Therapy" handout for pt and discussed ways to maximize nutritional intake until follow up visit per MD, including addition of Ensure, Sprouts Protein Powder to shakes, strained soups, puddings and jell-o.   Pt endorses stable weight at 257 lbs with no recent weight loss.  Medications: reglan, protonix   Labs: reviewed  NUTRITION - FOCUSED PHYSICAL EXAM:  Flowsheet Row Most Recent Value  Orbital Region No depletion  Upper Arm Region No depletion  Thoracic and Lumbar Region No depletion  Buccal Region No depletion  Temple Region No depletion  Clavicle Bone Region No depletion  Clavicle and Acromion Bone Region No depletion  Scapular Bone Region No depletion  Dorsal Hand No depletion  Patellar Region No depletion  Anterior Thigh Region No depletion  Posterior Calf Region No depletion  Edema (RD Assessment) Mild  [non-pitting BUE/BLE]  Hair Reviewed  Eyes Reviewed  Mouth Reviewed  Skin Reviewed  Nails Reviewed       Diet Order:   Diet Order             Diet - low sodium heart healthy           Diet full liquid Room service appropriate? Yes; Fluid consistency: Thin  Diet effective now                   EDUCATION NEEDS:   No education needs have been identified at this time  Skin:  Skin Assessment: Reviewed RN  Assessment  Last BM:  2/23  Height:   Ht Readings from Last 1 Encounters:  11/16/21 5\' 10"  (1.778 m)    Weight:   Wt Readings from Last 1 Encounters:  11/16/21 116.6 kg    Ideal Body Weight:  75.5 kg  BMI:  Body mass index is 36.88 kg/m.  Estimated Nutritional Needs:   Kcal:  2250-2450  Protein:  115-130 grams  Fluid:  > 2 L  Clayborne Dana, RDN, LDN Clinical Nutrition

## 2021-11-22 ENCOUNTER — Encounter (HOSPITAL_COMMUNITY): Payer: Self-pay | Admitting: Internal Medicine

## 2021-11-22 DIAGNOSIS — I480 Paroxysmal atrial fibrillation: Secondary | ICD-10-CM | POA: Diagnosis not present

## 2021-11-22 DIAGNOSIS — I10 Essential (primary) hypertension: Secondary | ICD-10-CM | POA: Diagnosis not present

## 2021-11-26 ENCOUNTER — Emergency Department (HOSPITAL_COMMUNITY): Payer: PPO

## 2021-11-26 ENCOUNTER — Inpatient Hospital Stay (HOSPITAL_COMMUNITY): Payer: PPO

## 2021-11-26 ENCOUNTER — Encounter (HOSPITAL_COMMUNITY): Payer: Self-pay | Admitting: *Deleted

## 2021-11-26 ENCOUNTER — Inpatient Hospital Stay (HOSPITAL_COMMUNITY)
Admission: EM | Admit: 2021-11-26 | Discharge: 2021-12-02 | DRG: 378 | Disposition: A | Discharge status: Home Health | Payer: PPO | Attending: Internal Medicine | Admitting: Internal Medicine

## 2021-11-26 ENCOUNTER — Other Ambulatory Visit: Payer: Self-pay

## 2021-11-26 DIAGNOSIS — K5900 Constipation, unspecified: Secondary | ICD-10-CM | POA: Diagnosis present

## 2021-11-26 DIAGNOSIS — Z833 Family history of diabetes mellitus: Secondary | ICD-10-CM | POA: Diagnosis not present

## 2021-11-26 DIAGNOSIS — K920 Hematemesis: Secondary | ICD-10-CM | POA: Diagnosis not present

## 2021-11-26 DIAGNOSIS — K2980 Duodenitis without bleeding: Secondary | ICD-10-CM | POA: Diagnosis not present

## 2021-11-26 DIAGNOSIS — E669 Obesity, unspecified: Secondary | ICD-10-CM | POA: Diagnosis present

## 2021-11-26 DIAGNOSIS — K315 Obstruction of duodenum: Secondary | ICD-10-CM | POA: Diagnosis not present

## 2021-11-26 DIAGNOSIS — K449 Diaphragmatic hernia without obstruction or gangrene: Secondary | ICD-10-CM | POA: Diagnosis not present

## 2021-11-26 DIAGNOSIS — T18198A Other foreign object in esophagus causing other injury, initial encounter: Secondary | ICD-10-CM | POA: Diagnosis not present

## 2021-11-26 DIAGNOSIS — K862 Cyst of pancreas: Secondary | ICD-10-CM | POA: Diagnosis not present

## 2021-11-26 DIAGNOSIS — K2981 Duodenitis with bleeding: Secondary | ICD-10-CM | POA: Diagnosis not present

## 2021-11-26 DIAGNOSIS — I480 Paroxysmal atrial fibrillation: Secondary | ICD-10-CM | POA: Diagnosis present

## 2021-11-26 DIAGNOSIS — K3189 Other diseases of stomach and duodenum: Secondary | ICD-10-CM | POA: Diagnosis not present

## 2021-11-26 DIAGNOSIS — E782 Mixed hyperlipidemia: Secondary | ICD-10-CM | POA: Diagnosis not present

## 2021-11-26 DIAGNOSIS — I4892 Unspecified atrial flutter: Secondary | ICD-10-CM | POA: Diagnosis present

## 2021-11-26 DIAGNOSIS — I4821 Permanent atrial fibrillation: Secondary | ICD-10-CM | POA: Diagnosis not present

## 2021-11-26 DIAGNOSIS — Z8249 Family history of ischemic heart disease and other diseases of the circulatory system: Secondary | ICD-10-CM | POA: Diagnosis not present

## 2021-11-26 DIAGNOSIS — K429 Umbilical hernia without obstruction or gangrene: Secondary | ICD-10-CM | POA: Diagnosis not present

## 2021-11-26 DIAGNOSIS — K311 Adult hypertrophic pyloric stenosis: Secondary | ICD-10-CM | POA: Diagnosis present

## 2021-11-26 DIAGNOSIS — R7302 Impaired glucose tolerance (oral): Secondary | ICD-10-CM

## 2021-11-26 DIAGNOSIS — K922 Gastrointestinal hemorrhage, unspecified: Secondary | ICD-10-CM | POA: Diagnosis not present

## 2021-11-26 DIAGNOSIS — R1013 Epigastric pain: Secondary | ICD-10-CM

## 2021-11-26 DIAGNOSIS — E876 Hypokalemia: Secondary | ICD-10-CM | POA: Diagnosis present

## 2021-11-26 DIAGNOSIS — Z9181 History of falling: Secondary | ICD-10-CM | POA: Diagnosis not present

## 2021-11-26 DIAGNOSIS — D62 Acute posthemorrhagic anemia: Secondary | ICD-10-CM | POA: Diagnosis not present

## 2021-11-26 DIAGNOSIS — R066 Hiccough: Secondary | ICD-10-CM | POA: Diagnosis present

## 2021-11-26 DIAGNOSIS — I10 Essential (primary) hypertension: Secondary | ICD-10-CM | POA: Diagnosis present

## 2021-11-26 DIAGNOSIS — R112 Nausea with vomiting, unspecified: Secondary | ICD-10-CM | POA: Diagnosis not present

## 2021-11-26 DIAGNOSIS — Z7901 Long term (current) use of anticoagulants: Secondary | ICD-10-CM

## 2021-11-26 DIAGNOSIS — K269 Duodenal ulcer, unspecified as acute or chronic, without hemorrhage or perforation: Secondary | ICD-10-CM | POA: Diagnosis not present

## 2021-11-26 DIAGNOSIS — Z87891 Personal history of nicotine dependence: Secondary | ICD-10-CM | POA: Diagnosis not present

## 2021-11-26 DIAGNOSIS — K264 Chronic or unspecified duodenal ulcer with hemorrhage: Secondary | ICD-10-CM | POA: Diagnosis not present

## 2021-11-26 DIAGNOSIS — Z9981 Dependence on supplemental oxygen: Secondary | ICD-10-CM | POA: Diagnosis not present

## 2021-11-26 DIAGNOSIS — Z6833 Body mass index (BMI) 33.0-33.9, adult: Secondary | ICD-10-CM | POA: Diagnosis not present

## 2021-11-26 DIAGNOSIS — G4730 Sleep apnea, unspecified: Secondary | ICD-10-CM | POA: Diagnosis present

## 2021-11-26 DIAGNOSIS — Z8371 Family history of colonic polyps: Secondary | ICD-10-CM

## 2021-11-26 DIAGNOSIS — I4891 Unspecified atrial fibrillation: Secondary | ICD-10-CM | POA: Diagnosis not present

## 2021-11-26 DIAGNOSIS — K869 Disease of pancreas, unspecified: Secondary | ICD-10-CM | POA: Diagnosis not present

## 2021-11-26 DIAGNOSIS — J9811 Atelectasis: Secondary | ICD-10-CM | POA: Diagnosis not present

## 2021-11-26 DIAGNOSIS — K861 Other chronic pancreatitis: Secondary | ICD-10-CM | POA: Diagnosis present

## 2021-11-26 DIAGNOSIS — M549 Dorsalgia, unspecified: Secondary | ICD-10-CM | POA: Diagnosis not present

## 2021-11-26 DIAGNOSIS — Z83438 Family history of other disorder of lipoprotein metabolism and other lipidemia: Secondary | ICD-10-CM | POA: Diagnosis not present

## 2021-11-26 DIAGNOSIS — D72829 Elevated white blood cell count, unspecified: Secondary | ICD-10-CM | POA: Diagnosis present

## 2021-11-26 DIAGNOSIS — Z95828 Presence of other vascular implants and grafts: Secondary | ICD-10-CM

## 2021-11-26 DIAGNOSIS — Z452 Encounter for adjustment and management of vascular access device: Secondary | ICD-10-CM | POA: Diagnosis not present

## 2021-11-26 DIAGNOSIS — G4733 Obstructive sleep apnea (adult) (pediatric): Secondary | ICD-10-CM | POA: Diagnosis not present

## 2021-11-26 DIAGNOSIS — K2971 Gastritis, unspecified, with bleeding: Secondary | ICD-10-CM | POA: Diagnosis not present

## 2021-11-26 DIAGNOSIS — E66811 Obesity, class 1: Secondary | ICD-10-CM

## 2021-11-26 DIAGNOSIS — K219 Gastro-esophageal reflux disease without esophagitis: Secondary | ICD-10-CM | POA: Diagnosis present

## 2021-11-26 DIAGNOSIS — I700 Atherosclerosis of aorta: Secondary | ICD-10-CM | POA: Diagnosis not present

## 2021-11-26 DIAGNOSIS — K2970 Gastritis, unspecified, without bleeding: Secondary | ICD-10-CM | POA: Diagnosis not present

## 2021-11-26 DIAGNOSIS — G8929 Other chronic pain: Secondary | ICD-10-CM | POA: Diagnosis not present

## 2021-11-26 DIAGNOSIS — Z794 Long term (current) use of insulin: Secondary | ICD-10-CM | POA: Diagnosis not present

## 2021-11-26 DIAGNOSIS — K5730 Diverticulosis of large intestine without perforation or abscess without bleeding: Secondary | ICD-10-CM | POA: Diagnosis not present

## 2021-11-26 HISTORY — DX: Acute pancreatitis without necrosis or infection, unspecified: K85.90

## 2021-11-26 LAB — CBC WITH DIFFERENTIAL/PLATELET
Abs Immature Granulocytes: 0.05 10*3/uL (ref 0.00–0.07)
Basophils Absolute: 0.1 10*3/uL (ref 0.0–0.1)
Basophils Relative: 0 %
Eosinophils Absolute: 0.1 10*3/uL (ref 0.0–0.5)
Eosinophils Relative: 0 %
HCT: 40.8 % (ref 39.0–52.0)
Hemoglobin: 13.4 g/dL (ref 13.0–17.0)
Immature Granulocytes: 0 %
Lymphocytes Relative: 19 %
Lymphs Abs: 2.2 10*3/uL (ref 0.7–4.0)
MCH: 31 pg (ref 26.0–34.0)
MCHC: 32.8 g/dL (ref 30.0–36.0)
MCV: 94.4 fL (ref 80.0–100.0)
Monocytes Absolute: 1.6 10*3/uL — ABNORMAL HIGH (ref 0.1–1.0)
Monocytes Relative: 14 %
Neutro Abs: 7.8 10*3/uL — ABNORMAL HIGH (ref 1.7–7.7)
Neutrophils Relative %: 67 %
Platelets: 500 10*3/uL — ABNORMAL HIGH (ref 150–400)
RBC: 4.32 MIL/uL (ref 4.22–5.81)
RDW: 13.6 % (ref 11.5–15.5)
WBC: 11.7 10*3/uL — ABNORMAL HIGH (ref 4.0–10.5)
nRBC: 0 % (ref 0.0–0.2)

## 2021-11-26 LAB — COMPREHENSIVE METABOLIC PANEL
ALT: 19 U/L (ref 0–44)
AST: 21 U/L (ref 15–41)
Albumin: 3.6 g/dL (ref 3.5–5.0)
Alkaline Phosphatase: 78 U/L (ref 38–126)
Anion gap: 13 (ref 5–15)
BUN: 17 mg/dL (ref 8–23)
CO2: 24 mmol/L (ref 22–32)
Calcium: 9.3 mg/dL (ref 8.9–10.3)
Chloride: 97 mmol/L — ABNORMAL LOW (ref 98–111)
Creatinine, Ser: 0.83 mg/dL (ref 0.61–1.24)
GFR, Estimated: >60 mL/min (ref >60)
Glucose, Bld: 157 mg/dL — ABNORMAL HIGH (ref 70–99)
Potassium: 3.2 mmol/L — ABNORMAL LOW (ref 3.5–5.1)
Sodium: 134 mmol/L — ABNORMAL LOW (ref 135–145)
Total Bilirubin: 0.8 mg/dL (ref 0.3–1.2)
Total Protein: 7.5 g/dL (ref 6.5–8.1)

## 2021-11-26 LAB — LIPASE, BLOOD: Lipase: 36 U/L (ref 11–51)

## 2021-11-26 LAB — MAGNESIUM: Magnesium: 2 mg/dL (ref 1.7–2.4)

## 2021-11-26 LAB — OCCULT BLOOD GASTRIC / DUODENUM (SPECIMEN CUP)
Occult Blood, Gastric: POSITIVE — AB
pH, Gastric: 3

## 2021-11-26 LAB — PH, GASTRIC FLUID (GASTROCCULT CARD): pH, Gastric: 3

## 2021-11-26 MED ORDER — PANTOPRAZOLE SODIUM 40 MG IV SOLR
40.0000 mg | Freq: Two times a day (BID) | INTRAVENOUS | Status: DC
  Administered 2021-11-26 – 2021-12-02 (×12): 40 mg via INTRAVENOUS
  Filled 2021-11-26 (×12): qty 10

## 2021-11-26 MED ORDER — ONDANSETRON HCL 4 MG/2ML IJ SOLN
4.0000 mg | Freq: Once | INTRAMUSCULAR | Status: AC
  Administered 2021-11-26: 4 mg via INTRAVENOUS
  Filled 2021-11-26: qty 2

## 2021-11-26 MED ORDER — ONDANSETRON HCL 4 MG/2ML IJ SOLN: 4.0000 mg | Freq: Four times a day (QID) | INTRAMUSCULAR | Status: DC | PRN

## 2021-11-26 MED ORDER — PANTOPRAZOLE SODIUM 40 MG IV SOLR
40.0000 mg | Freq: Once | INTRAVENOUS | Status: AC
  Administered 2021-11-26: 40 mg via INTRAVENOUS
  Filled 2021-11-26: qty 10

## 2021-11-26 MED ORDER — POTASSIUM CHLORIDE 10 MEQ/100ML IV SOLN
10.0000 meq | INTRAVENOUS | Status: AC
  Administered 2021-11-26 (×3): 10 meq via INTRAVENOUS
  Filled 2021-11-26 (×3): qty 100

## 2021-11-26 MED ORDER — ACETAMINOPHEN 325 MG PO TABS: 650.0000 mg | ORAL_TABLET | Freq: Four times a day (QID) | ORAL | Status: DC | PRN

## 2021-11-26 MED ORDER — BRIMONIDINE TARTRATE 0.2 % OP SOLN
1.0000 [drp] | Freq: Two times a day (BID) | OPHTHALMIC | Status: DC
  Administered 2021-11-27 – 2021-12-02 (×11): 1 [drp] via OPHTHALMIC
  Filled 2021-11-26 (×4): qty 5

## 2021-11-26 MED ORDER — ONDANSETRON HCL 4 MG PO TABS: 4.0000 mg | ORAL_TABLET | Freq: Four times a day (QID) | ORAL | Status: DC | PRN

## 2021-11-26 MED ORDER — DILTIAZEM HCL ER COATED BEADS 240 MG PO CP24
240.0000 mg | ORAL_CAPSULE | Freq: Every day | ORAL | Status: DC
  Administered 2021-11-27 – 2021-12-02 (×6): 240 mg via ORAL
  Filled 2021-11-26 (×6): qty 1

## 2021-11-26 MED ORDER — TAMSULOSIN HCL 0.4 MG PO CAPS
0.8000 mg | ORAL_CAPSULE | Freq: Every evening | ORAL | Status: DC
  Administered 2021-11-27 – 2021-12-01 (×5): 0.8 mg via ORAL
  Filled 2021-11-26 (×6): qty 2

## 2021-11-26 MED ORDER — PROCHLORPERAZINE EDISYLATE 10 MG/2ML IJ SOLN: 10.0000 mg | INTRAMUSCULAR | Status: DC | PRN

## 2021-11-26 MED ORDER — SODIUM CHLORIDE 0.9 % IV BOLUS
1000.0000 mL | Freq: Once | INTRAVENOUS | Status: AC
Start: 2021-11-26 — End: 2021-11-26
  Administered 2021-11-26: 1000 mL via INTRAVENOUS

## 2021-11-26 MED ORDER — FENTANYL CITRATE PF 50 MCG/ML IJ SOSY
12.5000 ug | PREFILLED_SYRINGE | INTRAMUSCULAR | Status: DC | PRN
  Administered 2021-11-26 – 2021-11-28 (×4): 25 ug via INTRAVENOUS
  Filled 2021-11-26 (×4): qty 1

## 2021-11-26 MED ORDER — IOHEXOL 350 MG/ML SOLN
80.0000 mL | Freq: Once | INTRAVENOUS | Status: AC | PRN
Start: 2021-11-26 — End: 2021-11-26
  Administered 2021-11-26: 80 mL via INTRAVENOUS

## 2021-11-26 MED ORDER — SODIUM CHLORIDE 0.9 % IV SOLN: INTRAVENOUS | Status: DC

## 2021-11-26 MED ORDER — ACETAMINOPHEN 650 MG RE SUPP
650.0000 mg | Freq: Four times a day (QID) | RECTAL | Status: DC | PRN
Start: 2021-11-26 — End: 2021-12-02

## 2021-11-26 NOTE — Assessment & Plan Note (Addendum)
-   improved after NGT was placed to low suction ?

## 2021-11-26 NOTE — Assessment & Plan Note (Addendum)
-  Holding apixaban ?-IV Protonix ordered twice daily>>po ?--restart apixaban when ok with GI ?No further hematemesis ?

## 2021-11-26 NOTE — H&P (Signed)
History and Physical  [facility]  Sean Golden IWL:[id number] DOB: 1946-03-08 DOA: 11/26/2021  PCP: Celene Squibb, MD  Patient coming from: Home  Level of care: Telemetry  I have personally briefly reviewed patient's old medical records in [place]  Chief Complaint: nausea and vomiting   HPI: Sean Golden is a 76 year old gentleman who has been dealing with a difficult situation after being hospitalized for necrotizing pancreatitis requiring a 16-day hospitalization.  He was discharged again on 11/19/2021 after a brief admission for coffee-ground emesis.  He was seen by GI at that time and had EGD done with findings of nonbleeding duodenal ulceration.  He was treated with clear liquids and then advance to full liquids and discharged home on a full liquid diet.  He was supposed to advance his diet to soft after 1 week and follow-up with GI within 1 week.  He returned to the ED today complaining of vomiting abdominal pain and hiccups since around 2200 last night.  His emesis was Gastroccult positive.  His rectal exam was guaiac negative.  He had findings of hypokalemia and leukocytosis.  He was sent for another CT scan 11/26/21 with findings of moderate to severe distension of the distal esophagus and stomach with a transition point in the duodenum and findings of duodenal stricture and new findings of stomach distention.  GI was consulted and recommended starting IV Protonix, IV nausea medication and IV fluids and keep n.p.o. at this time.  Review of Systems: Review of Systems  Constitutional:  Positive for malaise/fatigue. Negative for chills, diaphoresis, fever and weight loss.  HENT: Negative.    Eyes: Negative.   Respiratory: Negative.    Cardiovascular:  Positive for palpitations and leg swelling. Negative for chest pain, orthopnea and claudication.  Gastrointestinal:  Positive for abdominal pain, heartburn, melena, nausea and vomiting. Negative for blood in stool.   Genitourinary: Negative.   Musculoskeletal: Negative.   Skin: Negative.   Neurological: Negative.   Endo/Heme/Allergies: Negative.   Psychiatric/Behavioral: Negative.    All other systems reviewed and are negative.   Past Medical History:  Diagnosis Date   Atrial fibrillation ([place]) 05/2014   Essential hypertension    GERD (gastroesophageal reflux disease)    Helicobacter pylori gastritis 08/2014   Treated with Pylera   Pancreatitis    Sleep apnea     Past Surgical History:  Procedure Laterality Date   CARDIAC ELECTROPHYSIOLOGY STUDY AND ABLATION  2010?   COLONOSCOPY  2005   SOLITARY RECTAL ULCER   COLONOSCOPY N/A 09/06/2014   Dr. Oneida Alar: three colon polyps, moderate diverticulosis throughout the entire examined colon   ESOPHAGOGASTRODUODENOSCOPY N/A 09/06/2014   Dr. Fields:moderate erosive gastritis, no Barrett's. +H.pylori gastritis, treated with Pylera   ESOPHAGOGASTRODUODENOSCOPY (EGD) WITH PROPOFOL N/A 11/17/2021   Procedure: ESOPHAGOGASTRODUODENOSCOPY (EGD) WITH PROPOFOL;  Surgeon: Rogene Houston, MD;  Location: [facility];  Service: Endoscopy;  Laterality: N/A;   HEMORRHOID BANDING N/A 09/06/2014   NO BANDING PERFORMED. NO INTERNAL HEMORRHOIDS IDENTIFIED    UPPER GASTROINTESTINAL ENDOSCOPY  2005 [facility]   VASECTOMY       reports that he quit smoking about 37 years ago. His smoking use included cigarettes. He started smoking about 54 years ago. He has a 7.50 pack-year smoking history. He has never used smokeless tobacco. He reports that he does not currently use alcohol after a past usage of about 14.0 standard drinks per week. He reports that he does not use drugs.  No Known Allergies  Family History  Problem Relation Age of Onset   Hypertension Mother    Heart attack Father    Hyperlipidemia Sister    Hyperlipidemia Brother    Colon polyps Brother    Hypertension Brother    Hypertension Sister    Diabetes Brother    Stroke Other    Diabetes Other     Hypertension Other    Hyperlipidemia Other    Colon cancer Neg Hx     Prior to Admission medications   Medication Sig Start Date End Date Taking? Authorizing Provider  acetaminophen (TYLENOL) 325 MG tablet Take 2 tablets (650 mg total) by mouth every 6 (six) hours as needed for mild pain, fever or headache (or Fever >/= 101). 11/12/21  Yes ,  L, MD  apixaban (ELIQUIS) 5 MG TABS tablet TAKE ONE TABLET ('5MG'$  TOTAL) BY MOUTH TWOTIMES DAILY Patient taking differently: Take 5 mg by mouth 2 (two) times daily. 08/20/21  Yes Satira Sark, MD  brimonidine (ALPHAGAN) 0.2 % ophthalmic solution Place 1 drop into the right eye 2 (two) times daily. 10/18/21  Yes [provider]  diltiazem (CARDIZEM CD) 240 MG 24 hr capsule Take 1 capsule (240 mg total) by mouth daily. 11/12/21  Yes ,  L, MD  furosemide (LASIX) 40 MG tablet Take 1 tablet (40 mg total) by mouth every other day. 11/14/21 11/14/22 Yes ,  L, MD  pantoprazole (PROTONIX) 40 MG tablet Take 1 tablet (40 mg total) by mouth 2 (two) times daily. 11/12/21  Yes ,  L, MD  polyethylene glycol (MIRALAX / GLYCOLAX) 17 g packet Take 17 g by mouth daily. 11/12/21  Yes ,  L, MD  potassium chloride (KLOR-CON M) 10 MEQ tablet Take 1 tablet (10 mEq total) by mouth every other day. 11/14/21  Yes ,  L, MD  tamsulosin (FLOMAX) 0.4 MG CAPS capsule Take 2 capsules (0.8 mg total) by mouth every evening. 11/12/21  Yes ,  L, MD  feeding supplement (ENSURE ENLIVE / ENSURE PLUS) LIQD Take 237 mLs by mouth 3 (three) times daily between meals. 11/19/21   Heath Lark D, DO    Physical Exam: Vitals:   11/26/21 0945 11/26/21 1000 11/26/21 1030 11/26/21 1100  BP: (!) 135/101 (!) 137/93 124/77 121/85  Pulse: 86 (!) 104 91 98  Resp: '18 20 20 15  '$ Temp:      TempSrc:      SpO2: 95% 96% 96% 97%  Weight:      Height:        Constitutional: chronically ill appearing  male, NAD, calm, comfortable Eyes: PERRL, lids and conjunctivae normal ENMT: Mucous membranes are moist. Posterior pharynx clear of any exudate or lesions.Normal dentition.  Neck: normal, supple, no masses, no thyromegaly Respiratory: clear to auscultation bilaterally, no wheezing, no crackles. Normal respiratory effort. No accessory muscle use.  Cardiovascular: normal s1, s2 sounds, no murmurs / rubs / gallops. No extremity edema. 2+ pedal pulses. No carotid bruits.  Abdomen: no tenderness, no masses palpated. No hepatosplenomegaly. Bowel sounds positive.  Musculoskeletal: no clubbing / cyanosis. No joint deformity upper and lower extremities. Good ROM, no contractures. Normal muscle tone.  Skin: no rashes, lesions, ulcers. No induration Neurologic: CN 2-12 grossly intact. Sensation intact, DTR normal. Strength 5/5 in all 4.  Psychiatric: Normal judgment and insight. Alert and oriented x 3. Normal mood.   Labs on Admission: I have personally reviewed following labs and imaging studies  CBC: Recent Labs  Lab 11/26/21 0754  WBC  11.7*  NEUTROABS 7.8*  HGB 13.4  HCT 40.8  MCV 94.4  PLT 616*   Basic Metabolic Panel: Recent Labs  Lab 11/26/21 0754  NA 134*  K 3.2*  CL 97*  CO2 24  GLUCOSE 157*  BUN 17  CREATININE 0.83  CALCIUM 9.3  MG 2.0   GFR: Estimated Creatinine Clearance: 97.5 mL/min (by C-G formula based on SCr of 0.83 mg/dL). Liver Function Tests: Recent Labs  Lab 11/26/21 0754  AST 21  ALT 19  ALKPHOS 78  BILITOT 0.8  PROT 7.5  ALBUMIN 3.6   Recent Labs  Lab 11/26/21 0754  LIPASE 36   No results for input(s): AMMONIA in the last 168 hours. Coagulation Profile: No results for input(s): INR, PROTIME in the last 168 hours. Cardiac Enzymes: No results for input(s): CKTOTAL, CKMB, CKMBINDEX, TROPONINI in the last 168 hours. BNP (last 3 results) No results for input(s): PROBNP in the last 8760 hours. HbA1C: No results for input(s): HGBA1C in the last 72  hours. CBG: No results for input(s): GLUCAP in the last 168 hours. Lipid Profile: No results for input(s): CHOL, HDL, LDLCALC, TRIG, CHOLHDL, LDLDIRECT in the last 72 hours. Thyroid Function Tests: No results for input(s): TSH, T4TOTAL, FREET4, T3FREE, THYROIDAB in the last 72 hours. Anemia Panel: No results for input(s): VITAMINB12, FOLATE, FERRITIN, TIBC, IRON, RETICCTPCT in the last 72 hours. Urine analysis:    Component Value Date/Time   COLORURINE YELLOW 05/22/2021 1707   APPEARANCEUR HAZY (A) 05/22/2021 1707   LABSPEC 1.017 05/22/2021 1707   PHURINE 5.0 05/22/2021 1707   GLUCOSEU NEGATIVE 05/22/2021 1707   HGBUR NEGATIVE 05/22/2021 1707   BILIRUBINUR NEGATIVE 05/22/2021 1707   KETONESUR NEGATIVE 05/22/2021 1707   PROTEINUR NEGATIVE 05/22/2021 1707   NITRITE NEGATIVE 05/22/2021 1707   LEUKOCYTESUR NEGATIVE 05/22/2021 1707    Radiological Exams on Admission: CT ABDOMEN PELVIS W CONTRAST  Result Date: 11/26/2021 CLINICAL DATA:  Abdominal pain, acute, nonlocalized. EXAM: CT ABDOMEN AND PELVIS WITH CONTRAST TECHNIQUE: Multidetector CT imaging of the abdomen and pelvis was performed using the standard protocol following bolus administration of intravenous contrast. RADIATION DOSE REDUCTION: This exam was performed according to the departmental dose-optimization program which includes automated exposure control, adjustment of the mA and/or kV according to patient size and/or use of iterative reconstruction technique. CONTRAST:  34m OMNIPAQUE IOHEXOL 350 MG/ML SOLN COMPARISON:  11/17/2021 FINDINGS: Lower chest: Small pleural effusions have resolved since the previous examination. Residual densities at the right lung base are most compatible with scarring or atelectasis. Hepatobiliary: Normal appearance of the liver with mild intrahepatic biliary dilatation. Normal appearance of the gallbladder. No significant extrahepatic biliary dilatation. Pancreas: Again noted are inflammatory changes  surrounding the pancreas with stranding and low-density collections. Again noted is a prominent bilobed low-density collection along the pancreatic tail that measures 8.9 x 3.4 cm on sequence 2 image 40 and roughly measured 8.8 x 3.4 cm on 11/17/2021. Decreased low-density collections along the anterior aspect of the pancreatic neck region. Low-density collection or pseudocysts just anterior to the IVC on sequence 2 image 27 measures 3.8 x 1.8 cm and previously measured 5.0 x 2.2 cm. Low-density collection between the duodenum and right hepatic lobe on sequence 2 image 38 measures 4.3 x 2.5 cm and previously measured 4.9 x 3.5 cm. Again noted is a complex collection in the right abdomen that is contiguous with peripancreatic inflammation and contains gas. This gas containing collection roughly measures 4.8 x 2.5 cm and previously measured 6.1 x  3.3 cm. Patchy areas of enhancement in the pancreas compatible with underlying pancreatic necrosis. Spleen: Normal in size without focal abnormality. Adrenals/Urinary Tract: Normal appearance of the adrenal glands. Normal appearance of both kidneys without hydronephrosis. 0.7 cm structure along the anterior left kidney on sequence 2 image 30 is too small to characterize and indeterminate. This probably represents a small hyperdense cyst based on a CT from 11/01/2021. Additional small low-density in left kidney lower pole probably represent a cyst. Normal appearance of the urinary bladder. Stomach/Bowel: Distal esophagus is distended with fluid. Stomach is very distended and contains a large amount of fluid. There is a transition point in the descending duodenum. Distal duodenum is decompressed. Small bowel is decompressed. Extensive diverticulosis involving the sigmoid colon and descending colon. Normal appendix. Vascular/Lymphatic: Splenic vein is small and surrounded by the pancreatic inflammation. However, there appears to be flow within the splenic vein. The portal vein  confluence is patent. Main portal venous system is patent. Atherosclerotic calcifications in the abdominal aorta without aneurysm. Main visceral arteries are patent. No significant lymph node enlargement in the abdomen or pelvis. Reproductive: Prostate contains calcifications. Other: Negative for free fluid. Negative for free air. Musculoskeletal: Disc space narrowing at L5-S1. No acute bone abnormality. Umbilical hernia containing fat. IMPRESSION: 1. Moderate to severe distension of the distal esophagus and stomach with a transition point in the duodenum. Findings could be related to obstruction or stricture in the duodenum secondary to the peripancreatic inflammatory changes. This stomach distension is new from the previous examination. 2. Residual inflammatory changes around the pancreas compatible with pancreatitis or sequela of pancreatitis. Again noted are multiple peripancreatic collections compatible with pseudocysts. Majority of these collections have slightly decreased in size with exception of the collection near the pancreatic tail which has minimally changed. Again noted is gas involving the collection in the right abdomen lateral to the duodenum. 3. Abdominal venous structures remain patent although the splenic vein is small. 4. Small pleural effusions have resolved. 5. Colonic diverticulosis. 6. Umbilical hernia. Electronically Signed   By: Markus Daft M.D.   On: 11/26/2021 09:31    Assessment/Plan Principal Problem:   Acute upper GI hemorrhage Active Problems:   Gastroesophageal reflux disease   Epigastric pain   Hematemesis   Intractable nausea and vomiting   Gastric outlet obstruction   Chronic pancreatitis ([place])   Essential hypertension   Atrial flutter ([facility])   Paroxysmal atrial fibrillation ([facility])   Sleep apnea   Mixed hyperlipidemia   Hypokalemia   Hiccups   Leukocytosis   Assessment and Plan: * Acute upper GI hemorrhage - Patient tested Gastroccult positive -Plan IV  Protonix twice daily -IV fluids -Keep n.p.o. for now - GI consultation requested - Follow daily CBC testing  Chronic pancreatitis ([place]) - He appears to have developed chronic pancreatitis at this point - Keep n.p.o. for now -IV fluids ordered as above -IV pain management as needed -IV Protonix for GI protection  Gastric outlet obstruction - N.p.o. for now - IV fluids and IV Protonix twice daily -GI consultation requested -Suspect exacerbated by chronic pancreatitis  Intractable nausea and vomiting - Aggressive supportive measures as ordered  Hematemesis - Holding all apixaban - IV nausea medication ordered -IV Protonix ordered twice daily  Epigastric pain - Abdomen very tender on exam likely due to chronic pancreatitis with mild acute exacerbation  Gastroesophageal reflux disease - IV Protonix as ordered  Hiccups - Supportive management as ordered with as needed Thorazine ordered  Hypokalemia - Check magnesium  and replace as needed -IV replacement ordered -Recheck in a.m.  Mixed hyperlipidemia - Currently diet controlled not on medication  Sleep apnea - We will offer CPAP therapy while in hospital  Paroxysmal atrial fibrillation ([facility]) - Pending resuming home oral diltiazem  Atrial flutter ([facility]) Resume Cardizem CD to 40 mg daily for heart rate control Holding apixaban temporarily  Essential hypertension Well-controlled following closely   DVT prophylaxis: SCDs  Code Status: Full   Family Communication: wife at bedside updated 3/6  Disposition Plan: TBD  Consults called: GI service   Admission status: INP   Level of care: Telemetry Irwin Brakeman MD [facility] How to contact the [facility] Attending or Consulting provider [place] or covering provider during after hours [place], for this patient?  Check the care team in [facility] and look for a) attending/consulting [facility] provider listed and b) the [facility] team listed Log into [email or web address] and use [facility]'s universal password to access. If you do not have the password, please contact the hospital operator. Locate the [facility] provider you are looking for under [facility] and page to a number that you can be directly reached. If you still have difficulty reaching the provider, please page the [facility] (Director on Call) for the Hospitalists listed on amion for assistance.   If 7PM-7AM, please contact night-coverage [email or web address] Password [email or web address]  11/26/2021, 11:16 AM

## 2021-11-26 NOTE — Hospital Course (Addendum)
76 year old gentleman who has been dealing with a difficult situation after being hospitalized for necrotizing pancreatitis requiring a 16-day hospitalization from 10/27/2021 to 11/12/2021.  He was readmitted from 11/16/2021 to 11/19/2021 secondary to hematemesis.  He underwent EGD on 11/17/2021 with evidence of hematin in gastric fundus status post suctioning, NG trauma in the antrum, normal pylorus, and nonbleeding duodenal ulcer without stigmata of bleeding, and duodenitis with noncritical luminal narrowing involving second portion of the duodenum. He was treated with clear liquids and then advance to full liquids and discharged home on a full liquid diet.  He was supposed to advance his diet to soft after 1 week and follow-up with GI within 1 week.  He returned to the ED on 11/26/21 complaining of vomiting abdominal pain and hiccups.   His emesis was Gastroccult positive.  His rectal exam was guaiac negative.  He had findings of hypokalemia and leukocytosis.  He was sent for another CT scan 11/26/21 with findings of moderate to severe distension of the distal esophagus and stomach with a transition point in the duodenum and findings of duodenal stricture and new findings of stomach distention.  GI was consulted and recommended starting IV Protonix, IV nausea medication and IV fluids and keep n.p.o. at this time. ? ?11/27/2021: NGT was placed on 3/6 with 1100cc dark liquid.   EGD  was performed on 11/27/21 and showed diffuse severe inflammation and edema in duodenal bulb with lumen narrowing secondary to inflammation in bulb and duodenal sweep ? ?11/28/21--long discussion with family.  They wish to pursue PICC line and TPN in the short term.  PICC line consult ordered ? ?11/29/21--patient feeling well without NG which was removed 11/28/21 evening.  No vomiting.  Abdominal pain is improving.  Tolerating sips of clear liquids with meds.  PICC line placed. ? ?11/30/21--pt had small volume melenotic stool.  Otherwise feeling well without  n/v/d, abd pain.  D/C delayed due to logistics of getting TPN set up on the weekend and getting insurance authorization. ? ?12/01/21--educated patient about checking CBGs and sliding scale insulin while on TPN.  Clinically doing well.  Plan d/c 12/02/21 if all insurance issues and logistics are set up for TPN at home ? ?11/21/21--no new complains;  BMP stable;  Hgb stable.  HH and TPN have been arranged.  Ready for d/c ?

## 2021-11-26 NOTE — Assessment & Plan Note (Signed)
Resume Cardizem CD to 40 mg daily for heart rate control ?Holding apixaban temporarily ?

## 2021-11-26 NOTE — ED Notes (Signed)
NG tube placement verified by Xray. Hooked up to intermittent suction per order.  ?

## 2021-11-26 NOTE — Assessment & Plan Note (Addendum)
-   N.p.o. initially ?-NG placed as discussed above ?- IV fluids and IV Protonix twice daily ?-GI consultation appreciated ?-exacerbated by severe inflammation at duodenal bulb resulting in duodenal stricture ?-11/27/21 EGD--diffuse severe infl and edema in duodenal bulb; lumen narrowed secondary to inflammation in bulb and duodenal sweep ?3/8--long discussion with family>>they prefer PICC line and TPN--patient intolerant to oral or NJ tube feeding and NJ tube due to severity of duodenitis and hx of necrotizing pancreatitis ?3/9--PICC line placed ?11/30/21--TPN started ?

## 2021-11-26 NOTE — Assessment & Plan Note (Signed)
-   Currently diet controlled not on medication ?

## 2021-11-26 NOTE — Assessment & Plan Note (Addendum)
-   Check magnesium--2.0 ?-IV replacement ordered ?-add KCl to IVF ?-Recheck in a.m.\ ?--now on TPN ?

## 2021-11-26 NOTE — Consult Note (Signed)
Gastroenterology Consult   Referring Provider: Dr. Terrilee Croak ED Primary Care Physician:  Celene Squibb, MD Primary Gastroenterologist:  Dr. Laural Golden   Patient ID: Sean Golden; [id number]; [birth year]   Admit date: 11/26/2021  LOS: 0 days   Date of Consultation: 11/26/2021  Reason for Consultation:  Vomiting, hematemesis   History of Present Illness   Sean Golden is a 76 y.o. year old male with history of pancreatitis felt secondary to alcohol but unable to rule out microlithiasis, with first episode 10/28/21. Required prolonged hospitalization and developed necrotizing pancreatitis. He was discharged 11/12/21. Returned on 11/16/21 with hematemesis in setting of Eliquis. EGD undertaken with normal esophagus, Z-line irregular, non-bleeding duodenal ulcer without stigmata of bleeding, duodenitis involving second part of duodenum with noncritical luminal narrowing. CT 2/25 with worsening of complicated/necrotizing pancreatitis,  with interval increase in peripancreatic fluid and inflammation and development of adjacent acute necrotic collections. He was discharged home on full liquids with plans to advance diet to soft foods in 1 week and dedicated pancreatic imaging in 6-8 weeks.   Returns today with hematemesis. Was doing well until yesterday. Had been following full liqui diet. Had Ensure and then small bit of ice cream at 230. Felt food was stacking in esophagus. Started getting hiccups. He will hiccup, vomit, then find relief for 15-20 minutes. Reports emesis is black.   Yesterday morning last dose of Eliquis. Notes epigastric pain.   Lipase normal. CT abd/pelvis with contrast shows moderate to severe distension of distal esophagus and stomach with a transition point in duodenum. Differentials include obstruction or stricture in duodenum secondary to peripancreatic inflammatory changes. Residual inflammatory changes around pancreas noted and multiple peripancreatic collections  compatible with pseudocysts. These pseudocysts have decreased in size except for collection near pancreatic tail that has minimal changes.     Past Medical History:  Diagnosis Date   Atrial fibrillation ([place]) 05/2014   Essential hypertension    GERD (gastroesophageal reflux disease)    Helicobacter pylori gastritis 08/2014   Treated with Pylera   Pancreatitis    Sleep apnea     Past Surgical History:  Procedure Laterality Date   CARDIAC ELECTROPHYSIOLOGY STUDY AND ABLATION  2010?   COLONOSCOPY  2005   SOLITARY RECTAL ULCER   COLONOSCOPY N/A 09/06/2014   Dr. Oneida Alar: three colon polyps, moderate diverticulosis throughout the entire examined colon   ESOPHAGOGASTRODUODENOSCOPY N/A 09/06/2014   Dr. Fields:moderate erosive gastritis, no Barrett's. +H.pylori gastritis, treated with Pylera   ESOPHAGOGASTRODUODENOSCOPY (EGD) WITH PROPOFOL N/A 11/17/2021   Procedure: ESOPHAGOGASTRODUODENOSCOPY (EGD) WITH PROPOFOL;  Surgeon: Rogene Houston, MD;  Location: [facility];  Service: Endoscopy;  Laterality: N/A;   HEMORRHOID BANDING N/A 09/06/2014   NO BANDING PERFORMED. NO INTERNAL HEMORRHOIDS IDENTIFIED    UPPER GASTROINTESTINAL ENDOSCOPY  2005 [facility]   VASECTOMY      Prior to Admission medications   Medication Sig Start Date End Date Taking? Authorizing Provider  acetaminophen (TYLENOL) 325 MG tablet Take 2 tablets (650 mg total) by mouth every 6 (six) hours as needed for mild pain, fever or headache (or Fever >/= 101). 11/12/21  Yes Johnson, Clanford L, MD  apixaban (ELIQUIS) 5 MG TABS tablet TAKE ONE TABLET ('5MG'$  TOTAL) BY MOUTH TWOTIMES DAILY Patient taking differently: Take 5 mg by mouth 2 (two) times daily. 08/20/21  Yes Satira Sark, MD  brimonidine (ALPHAGAN) 0.2 % ophthalmic solution Place 1 drop into the right eye 2 (two) times daily. 10/18/21  Yes [provider]  diltiazem (CARDIZEM CD) 240 MG 24 hr capsule Take 1 capsule (240 mg total) by mouth daily. 11/12/21  Yes  Johnson, Clanford L, MD  furosemide (LASIX) 40 MG tablet Take 1 tablet (40 mg total) by mouth every other day. 11/14/21 11/14/22 Yes Johnson, Clanford L, MD  pantoprazole (PROTONIX) 40 MG tablet Take 1 tablet (40 mg total) by mouth 2 (two) times daily. 11/12/21  Yes Johnson, Clanford L, MD  polyethylene glycol (MIRALAX / GLYCOLAX) 17 g packet Take 17 g by mouth daily. 11/12/21  Yes Johnson, Clanford L, MD  potassium chloride (KLOR-CON M) 10 MEQ tablet Take 1 tablet (10 mEq total) by mouth every other day. 11/14/21  Yes Johnson, Clanford L, MD  tamsulosin (FLOMAX) 0.4 MG CAPS capsule Take 2 capsules (0.8 mg total) by mouth every evening. 11/12/21  Yes Johnson, Clanford L, MD  feeding supplement (ENSURE ENLIVE / ENSURE PLUS) LIQD Take 237 mLs by mouth 3 (three) times daily between meals. 11/19/21   Heath Lark D, DO    Current Facility-Administered Medications  Medication Dose Route Frequency Provider Last Rate Last Admin   0.9 %  sodium chloride infusion   Intravenous Continuous Wynetta Emery, Clanford L, MD 100 mL/hr at 11/26/21 1124 New Bag at 11/26/21 1124   acetaminophen (TYLENOL) tablet 650 mg  650 mg Oral Q6H PRN Johnson, Clanford L, MD       Or   acetaminophen (TYLENOL) suppository 650 mg  650 mg Rectal Q6H PRN Johnson, Clanford L, MD       brimonidine (ALPHAGAN) 0.2 % ophthalmic solution 1 drop  1 drop Right Eye BID Wynetta Emery, Clanford L, MD       [START ON 11/27/2021] diltiazem (CARDIZEM CD) 24 hr capsule 240 mg  240 mg Oral Daily Johnson, Clanford L, MD       fentaNYL (SUBLIMAZE) injection 12.5-25 mcg  12.5-25 mcg Intravenous Q2H PRN Johnson, Clanford L, MD       ondansetron (ZOFRAN) tablet 4 mg  4 mg Oral Q6H PRN Johnson, Clanford L, MD       Or   ondansetron (ZOFRAN) injection 4 mg  4 mg Intravenous Q6H PRN Johnson, Clanford L, MD       pantoprazole (PROTONIX) injection 40 mg  40 mg Intravenous Q12H Johnson, Clanford L, MD       potassium chloride 10 mEq in 100 mL IVPB  10 mEq Intravenous Q1 Hr x 3  Johnson, Clanford L, MD 100 mL/hr at 11/26/21 1116 10 mEq at 11/26/21 1116   prochlorperazine (COMPAZINE) injection 10 mg  10 mg Intravenous Q4H PRN Johnson, Clanford L, MD       tamsulosin (FLOMAX) capsule 0.8 mg  0.8 mg Oral QPM Johnson, Clanford L, MD       Current Outpatient Medications  Medication Sig Dispense Refill   acetaminophen (TYLENOL) 325 MG tablet Take 2 tablets (650 mg total) by mouth every 6 (six) hours as needed for mild pain, fever or headache (or Fever >/= 101).     apixaban (ELIQUIS) 5 MG TABS tablet TAKE ONE TABLET ('5MG'$  TOTAL) BY MOUTH TWOTIMES DAILY (Patient taking differently: Take 5 mg by mouth 2 (two) times daily.) 60 tablet 5   brimonidine (ALPHAGAN) 0.2 % ophthalmic solution Place 1 drop into the right eye 2 (two) times daily.     diltiazem (CARDIZEM CD) 240 MG 24 hr capsule Take 1 capsule (240 mg total) by mouth daily. 30 capsule 2   furosemide (LASIX) 40 MG tablet Take 1 tablet (  40 mg total) by mouth every other day. 15 tablet 0   pantoprazole (PROTONIX) 40 MG tablet Take 1 tablet (40 mg total) by mouth 2 (two) times daily. 30 tablet 11   polyethylene glycol (MIRALAX / GLYCOLAX) 17 g packet Take 17 g by mouth daily. 30 each 2   potassium chloride (KLOR-CON M) 10 MEQ tablet Take 1 tablet (10 mEq total) by mouth every other day. 15 tablet 0   tamsulosin (FLOMAX) 0.4 MG CAPS capsule Take 2 capsules (0.8 mg total) by mouth every evening. 60 capsule 2   feeding supplement (ENSURE ENLIVE / ENSURE PLUS) LIQD Take 237 mLs by mouth 3 (three) times daily between meals. 237 mL 12    Allergies as of 11/26/2021   (No Known Allergies)    Family History  Problem Relation Age of Onset   Hypertension Mother    Heart attack Father    Hyperlipidemia Sister    Hyperlipidemia Brother    Colon polyps Brother    Hypertension Brother    Hypertension Sister    Diabetes Brother    Stroke Other    Diabetes Other    Hypertension Other    Hyperlipidemia Other    Colon cancer Neg  Hx     Social History   Socioeconomic History   Marital status: Married    Spouse name: Not on file   Number of children: Not on file   Years of education: Not on file   Highest education level: Not on file  Occupational History   Occupation: Full time  Tobacco Use   Smoking status: Former    Packs/day: 0.50    Years: 15.00    Pack years: 7.50    Types: Cigarettes    Start date: 05/28/1967    Quit date: 05/27/1984    Years since quitting: 37.5   Smokeless tobacco: Never  Vaping Use   Vaping Use: Never used  Substance and Sexual Activity   Alcohol use: Not Currently    Alcohol/week: 14.0 standard drinks    Types: 14 Glasses of wine per week    Comment: hx of 1 or 2 glasses of wine at night; none currently   Drug use: No   Sexual activity: Not on file  Other Topics Concern   Not on file  Social History Narrative   Married-2 KIDS(AGE 61 AND 38).   No regular exercise   Social Determinants of Health   Financial Resource Strain: Not on file  Food Insecurity: Not on file  Transportation Needs: Not on file  Physical Activity: Not on file  Stress: Not on file  Social Connections: Not on file  Intimate Partner Violence: Not on file     Review of Systems   Gen: Denies any fever, chills, loss of appetite, change in weight or weight loss CV: Denies chest pain, heart palpitations, syncope, edema  Resp: Denies shortness of breath with rest, cough, wheezing, coughing up blood, and pleurisy. GI: see HPI GU : Denies urinary burning, blood in urine, urinary frequency, and urinary incontinence. MS: Denies joint pain, limitation of movement, swelling, cramps, and atrophy.  Derm: Denies rash, itching, dry skin, hives. Psych: Denies depression, anxiety, memory loss, hallucinations, and confusion. Heme: Denies bruising or bleeding Neuro:  Denies any headaches, dizziness, paresthesias, shaking  Physical Exam   Vital Signs in last 24 hours: Temp:  [98.2 F (36.8 C)] 98.2 F  (36.8 C) (03/06 0750) Pulse Rate:  [86-111] 92 (03/06 1130) Resp:  [15-21] 21 (03/06 1130)  BP: (121-145)/(77-101) 129/81 (03/06 1130) SpO2:  [92 %-97 %] 92 % (03/06 1130) Weight:  [176 kg] 108 kg (03/06 0744)    General:   Alert,  Well-developed, uncomfortable appearing Head:  Normocephalic and atraumatic. Eyes:  Sclera clear, no icterus.   Conjunctiva pink. Ears:  Normal auditory acuity. Mouth:  No deformity or lesions, dentition normal. Lungs:  Clear throughout to auscultation.    Heart:  S1 S2 present Abdomen:  distended but soft. TTP upper abdomen. Succussion splash notable.  Rectal: deferred   Msk:  Symmetrical without gross deformities. Normal posture. Extremities:  Without clubbing or edema. Neurologic:  Alert and  oriented x4. Skin:  Intact without significant lesions or rashes. Psych:  Alert and cooperative. Normal mood and affect.  Intake/Output from previous day: No intake/output data recorded. Intake/Output this shift: Total I/O In: 1000 [IV Piggyback:1000] Out: -     Labs/Studies   Recent Labs Recent Labs    11/26/21 0754  WBC 11.7*  HGB 13.4  HCT 40.8  PLT 500*   BMET Recent Labs    11/26/21 0754  NA 134*  K 3.2*  CL 97*  CO2 24  GLUCOSE 157*  BUN 17  CREATININE 0.83  CALCIUM 9.3   LFT Recent Labs    11/26/21 0754  PROT 7.5  ALBUMIN 3.6  AST 21  ALT 19  ALKPHOS 78  BILITOT 0.8    Radiology/Studies CT ABDOMEN PELVIS W CONTRAST  Result Date: 11/26/2021 CLINICAL DATA:  Abdominal pain, acute, nonlocalized. EXAM: CT ABDOMEN AND PELVIS WITH CONTRAST TECHNIQUE: Multidetector CT imaging of the abdomen and pelvis was performed using the standard protocol following bolus administration of intravenous contrast. RADIATION DOSE REDUCTION: This exam was performed according to the departmental dose-optimization program which includes automated exposure control, adjustment of the mA and/or kV according to patient size and/or use of iterative  reconstruction technique. CONTRAST:  56m OMNIPAQUE IOHEXOL 350 MG/ML SOLN COMPARISON:  11/17/2021 FINDINGS: Lower chest: Small pleural effusions have resolved since the previous examination. Residual densities at the right lung base are most compatible with scarring or atelectasis. Hepatobiliary: Normal appearance of the liver with mild intrahepatic biliary dilatation. Normal appearance of the gallbladder. No significant extrahepatic biliary dilatation. Pancreas: Again noted are inflammatory changes surrounding the pancreas with stranding and low-density collections. Again noted is a prominent bilobed low-density collection along the pancreatic tail that measures 8.9 x 3.4 cm on sequence 2 image 40 and roughly measured 8.8 x 3.4 cm on 11/17/2021. Decreased low-density collections along the anterior aspect of the pancreatic neck region. Low-density collection or pseudocysts just anterior to the IVC on sequence 2 image 27 measures 3.8 x 1.8 cm and previously measured 5.0 x 2.2 cm. Low-density collection between the duodenum and right hepatic lobe on sequence 2 image 38 measures 4.3 x 2.5 cm and previously measured 4.9 x 3.5 cm. Again noted is a complex collection in the right abdomen that is contiguous with peripancreatic inflammation and contains gas. This gas containing collection roughly measures 4.8 x 2.5 cm and previously measured 6.1 x 3.3 cm. Patchy areas of enhancement in the pancreas compatible with underlying pancreatic necrosis. Spleen: Normal in size without focal abnormality. Adrenals/Urinary Tract: Normal appearance of the adrenal glands. Normal appearance of both kidneys without hydronephrosis. 0.7 cm structure along the anterior left kidney on sequence 2 image 30 is too small to characterize and indeterminate. This probably represents a small hyperdense cyst based on a CT from 11/01/2021. Additional small low-density in left kidney lower  pole probably represent a cyst. Normal appearance of the urinary  bladder. Stomach/Bowel: Distal esophagus is distended with fluid. Stomach is very distended and contains a large amount of fluid. There is a transition point in the descending duodenum. Distal duodenum is decompressed. Small bowel is decompressed. Extensive diverticulosis involving the sigmoid colon and descending colon. Normal appendix. Vascular/Lymphatic: Splenic vein is small and surrounded by the pancreatic inflammation. However, there appears to be flow within the splenic vein. The portal vein confluence is patent. Main portal venous system is patent. Atherosclerotic calcifications in the abdominal aorta without aneurysm. Main visceral arteries are patent. No significant lymph node enlargement in the abdomen or pelvis. Reproductive: Prostate contains calcifications. Other: Negative for free fluid. Negative for free air. Musculoskeletal: Disc space narrowing at L5-S1. No acute bone abnormality. Umbilical hernia containing fat. IMPRESSION: 1. Moderate to severe distension of the distal esophagus and stomach with a transition point in the duodenum. Findings could be related to obstruction or stricture in the duodenum secondary to the peripancreatic inflammatory changes. This stomach distension is new from the previous examination. 2. Residual inflammatory changes around the pancreas compatible with pancreatitis or sequela of pancreatitis. Again noted are multiple peripancreatic collections compatible with pseudocysts. Majority of these collections have slightly decreased in size with exception of the collection near the pancreatic tail which has minimally changed. Again noted is gas involving the collection in the right abdomen lateral to the duodenum. 3. Abdominal venous structures remain patent although the splenic vein is small. 4. Small pleural effusions have resolved. 5. Colonic diverticulosis. 6. Umbilical hernia. Electronically Signed   By: Markus Daft M.D.   On: 11/26/2021 09:31     Assessment   Sean Golden is a 76 y.o. year old male  with history of pancreatitis felt secondary to alcohol but unable to rule out microlithiasis, with first episode 10/28/21, requiring prolonged hospitalization and developing necrotizing pancreatitis. He was discharged 11/12/21 but returned on 11/16/21 with hematemesis in setting of Eliquis. EGD undertaken with normal esophagus, Z-line irregular, non-bleeding duodenal ulcer without stigmata of bleeding, duodenitis involving second part of duodenum with noncritical luminal narrowing. CT 2/25 with worsening of complicated/necrotizing pancreatitis,  with interval increase in peripancreatic fluid and inflammation and development of adjacent acute necrotic collections.    Now returning with recurrent vomiting and evidence for distal esophogeal and stomach distension and transition point in duodenum, likely caused from duodenal stricture/obstruction in setting of peripancreatic inflammatory changes. He does have evidence for pseudocysts but have decreased in size except for collection near pancreatic tail that has minimal changes.   Reported hematemesis most likely secondary to known duodenal ulcer and unable to exclude MW tear in setting of repetitive vomiting. Hgb is normal at 13.4 this morning.   We have requested NG tube placement for decompression. Could benefit from repeat EGD, likely 3/7. I have also reached out to Dr. Rush Landmark and awaiting to hear back regarding candidacy for cystogastrostomy in this scenario.    Plan / Recommendations    NPO NG tube to intermittent low wall suction Follow H/H Discussing next best steps with Dr. Abbey Chatters. Anticipate EGD tomorrow.  Continue to hold Eliquis. Last dose was the morning of 3/5.  Awaiting to hear back from Dr. Rush Landmark regarding appropriateness of pursuing cystogastrostomy.      11/26/2021, 11:42 AM  Annitta Needs, PhD, ANP-BC [facility]

## 2021-11-26 NOTE — ED Notes (Signed)
Pt gone to CT at this time.

## 2021-11-26 NOTE — Assessment & Plan Note (Addendum)
-   Aggressive supportive measures as ordered ?-improved with NG decompression ?-NG removed 3/8 evening>>no further vomiting ?-now overall improved, tolerating sips with meds ?

## 2021-11-26 NOTE — Assessment & Plan Note (Addendum)
Controlled ?Continue diltiazem CD ?

## 2021-11-26 NOTE — ED Triage Notes (Signed)
Pt c/o vomiting, abdominal pain and hiccups since 2200 last night. Vomit is brown in color. Hx of pancreatitis.  ?

## 2021-11-26 NOTE — ED Provider Notes (Signed)
[facility] Provider Note   CSN: [id number] Arrival date & time: 11/26/21  7425     History  Chief Complaint  Patient presents with   Emesis    Sean Golden is a 76 y.o. male.  Patient presents with vomiting all night long with black material being vomited.  Patient is on Eliquis for atrial fibs but did not take it last night or this morning.  He is also got a history of pancreatitis  The history is provided by the patient and medical records. No language interpreter was used.  Emesis Severity:  Moderate Timing:  Constant Quality:  Stomach contents Progression:  Worsening Chronicity:  New Recent urination:  Normal Relieved by:  Nothing Worsened by:  Nothing Ineffective treatments:  None tried Associated symptoms: no abdominal pain, no cough, no diarrhea and no headaches       Home Medications Prior to Admission medications   Medication Sig Start Date End Date Taking? Authorizing Provider  acetaminophen (TYLENOL) 325 MG tablet Take 2 tablets (650 mg total) by mouth every 6 (six) hours as needed for mild pain, fever or headache (or Fever >/= 101). 11/12/21   Johnson, Clanford L, MD  apixaban (ELIQUIS) 5 MG TABS tablet TAKE ONE TABLET ('5MG'$  TOTAL) BY MOUTH TWOTIMES DAILY Patient taking differently: Take 5 mg by mouth 2 (two) times daily. 08/20/21   Satira Sark, MD  brimonidine (ALPHAGAN) 0.2 % ophthalmic solution Place 1 drop into the right eye 2 (two) times daily. 10/18/21   [provider]  diltiazem (CARDIZEM CD) 240 MG 24 hr capsule Take 1 capsule (240 mg total) by mouth daily. 11/12/21   Johnson, Clanford L, MD  feeding supplement (ENSURE ENLIVE / ENSURE PLUS) LIQD Take 237 mLs by mouth 3 (three) times daily between meals. 11/19/21   Manuella Ghazi, Pratik D, DO  furosemide (LASIX) 40 MG tablet Take 1 tablet (40 mg total) by mouth every other day. 11/14/21 11/14/22  Wynetta Emery, Clanford L, MD  pantoprazole (PROTONIX) 40 MG tablet Take 1 tablet (40 mg  total) by mouth 2 (two) times daily. 11/12/21   Johnson, Clanford L, MD  polyethylene glycol (MIRALAX / GLYCOLAX) 17 g packet Take 17 g by mouth daily. 11/12/21   Johnson, Clanford L, MD  potassium chloride (KLOR-CON M) 10 MEQ tablet Take 1 tablet (10 mEq total) by mouth every other day. 11/14/21   Murlean Iba, MD  tamsulosin (FLOMAX) 0.4 MG CAPS capsule Take 2 capsules (0.8 mg total) by mouth every evening. 11/12/21   Murlean Iba, MD      Allergies    Patient has no known allergies.    Review of Systems   Review of Systems  Constitutional:  Negative for appetite change and fatigue.  HENT:  Negative for congestion, ear discharge and sinus pressure.   Eyes:  Negative for discharge.  Respiratory:  Negative for cough.   Cardiovascular:  Negative for chest pain.  Gastrointestinal:  Positive for vomiting. Negative for abdominal pain and diarrhea.  Genitourinary:  Negative for frequency and hematuria.  Musculoskeletal:  Negative for back pain.  Skin:  Negative for rash.  Neurological:  Negative for seizures and headaches.  Psychiatric/Behavioral:  Negative for hallucinations.    Physical Exam Updated Vital Signs BP 124/77    Pulse 91    Temp 98.2 F (36.8 C) (Oral)    Resp 20    Ht '5\' 10"'$  (1.778 m)    Wt 108 kg    SpO2 96%  BMI 34.15 kg/m  Physical Exam Vitals and nursing note reviewed.  Constitutional:      Appearance: He is well-developed.  HENT:     Head: Normocephalic.     Mouth/Throat:     Mouth: Mucous membranes are moist.  Eyes:     General: No scleral icterus.    Conjunctiva/sclera: Conjunctivae normal.  Neck:     Thyroid: No thyromegaly.  Cardiovascular:     Rate and Rhythm: Normal rate and regular rhythm.     Heart sounds: No murmur heard.   No friction rub. No gallop.  Pulmonary:     Breath sounds: No stridor. No wheezing or rales.  Chest:     Chest wall: No tenderness.  Abdominal:     General: There is no distension.     Tenderness: There is  abdominal tenderness. There is no rebound.  Genitourinary:    Comments: Heme positive Musculoskeletal:        General: Normal range of motion.     Cervical back: Neck supple.  Lymphadenopathy:     Cervical: No cervical adenopathy.  Skin:    Findings: No erythema or rash.  Neurological:     Mental Status: He is alert and oriented to person, place, and time.     Motor: No abnormal muscle tone.     Coordination: Coordination normal.  Psychiatric:        Behavior: Behavior normal.    ED Results / Procedures / Treatments   Labs (all labs ordered are listed, but only abnormal results are displayed) Labs Reviewed  CBC WITH DIFFERENTIAL/PLATELET - Abnormal; Notable for the following components:      Result Value   WBC 11.7 (*)    Platelets 500 (*)    Neutro Abs 7.8 (*)    Monocytes Absolute 1.6 (*)    All other components within normal limits  COMPREHENSIVE METABOLIC PANEL - Abnormal; Notable for the following components:   Sodium 134 (*)    Potassium 3.2 (*)    Chloride 97 (*)    Glucose, Bld 157 (*)    All other components within normal limits  OCCULT BLOOD GASTRIC / DUODENUM (SPECIMEN CUP) - Abnormal; Notable for the following components:   Occult Blood, Gastric POSITIVE (*)    All other components within normal limits  LIPASE, BLOOD  PH, GASTRIC FLUID (GASTROCCULT CARD)    EKG None  Radiology CT ABDOMEN PELVIS W CONTRAST  Result Date: 11/26/2021 CLINICAL DATA:  Abdominal pain, acute, nonlocalized. EXAM: CT ABDOMEN AND PELVIS WITH CONTRAST TECHNIQUE: Multidetector CT imaging of the abdomen and pelvis was performed using the standard protocol following bolus administration of intravenous contrast. RADIATION DOSE REDUCTION: This exam was performed according to the departmental dose-optimization program which includes automated exposure control, adjustment of the mA and/or kV according to patient size and/or use of iterative reconstruction technique. CONTRAST:  66m OMNIPAQUE  IOHEXOL 350 MG/ML SOLN COMPARISON:  11/17/2021 FINDINGS: Lower chest: Small pleural effusions have resolved since the previous examination. Residual densities at the right lung base are most compatible with scarring or atelectasis. Hepatobiliary: Normal appearance of the liver with mild intrahepatic biliary dilatation. Normal appearance of the gallbladder. No significant extrahepatic biliary dilatation. Pancreas: Again noted are inflammatory changes surrounding the pancreas with stranding and low-density collections. Again noted is a prominent bilobed low-density collection along the pancreatic tail that measures 8.9 x 3.4 cm on sequence 2 image 40 and roughly measured 8.8 x 3.4 cm on 11/17/2021. Decreased low-density collections along the anterior  aspect of the pancreatic neck region. Low-density collection or pseudocysts just anterior to the IVC on sequence 2 image 27 measures 3.8 x 1.8 cm and previously measured 5.0 x 2.2 cm. Low-density collection between the duodenum and right hepatic lobe on sequence 2 image 38 measures 4.3 x 2.5 cm and previously measured 4.9 x 3.5 cm. Again noted is a complex collection in the right abdomen that is contiguous with peripancreatic inflammation and contains gas. This gas containing collection roughly measures 4.8 x 2.5 cm and previously measured 6.1 x 3.3 cm. Patchy areas of enhancement in the pancreas compatible with underlying pancreatic necrosis. Spleen: Normal in size without focal abnormality. Adrenals/Urinary Tract: Normal appearance of the adrenal glands. Normal appearance of both kidneys without hydronephrosis. 0.7 cm structure along the anterior left kidney on sequence 2 image 30 is too small to characterize and indeterminate. This probably represents a small hyperdense cyst based on a CT from 11/01/2021. Additional small low-density in left kidney lower pole probably represent a cyst. Normal appearance of the urinary bladder. Stomach/Bowel: Distal esophagus is  distended with fluid. Stomach is very distended and contains a large amount of fluid. There is a transition point in the descending duodenum. Distal duodenum is decompressed. Small bowel is decompressed. Extensive diverticulosis involving the sigmoid colon and descending colon. Normal appendix. Vascular/Lymphatic: Splenic vein is small and surrounded by the pancreatic inflammation. However, there appears to be flow within the splenic vein. The portal vein confluence is patent. Main portal venous system is patent. Atherosclerotic calcifications in the abdominal aorta without aneurysm. Main visceral arteries are patent. No significant lymph node enlargement in the abdomen or pelvis. Reproductive: Prostate contains calcifications. Other: Negative for free fluid. Negative for free air. Musculoskeletal: Disc space narrowing at L5-S1. No acute bone abnormality. Umbilical hernia containing fat. IMPRESSION: 1. Moderate to severe distension of the distal esophagus and stomach with a transition point in the duodenum. Findings could be related to obstruction or stricture in the duodenum secondary to the peripancreatic inflammatory changes. This stomach distension is new from the previous examination. 2. Residual inflammatory changes around the pancreas compatible with pancreatitis or sequela of pancreatitis. Again noted are multiple peripancreatic collections compatible with pseudocysts. Majority of these collections have slightly decreased in size with exception of the collection near the pancreatic tail which has minimally changed. Again noted is gas involving the collection in the right abdomen lateral to the duodenum. 3. Abdominal venous structures remain patent although the splenic vein is small. 4. Small pleural effusions have resolved. 5. Colonic diverticulosis. 6. Umbilical hernia. Electronically Signed   By: Markus Daft M.D.   On: 11/26/2021 09:31    Procedures Procedures    Medications Ordered in ED Medications   pantoprazole (PROTONIX) injection 40 mg (40 mg Intravenous Given 11/26/21 0908)  sodium chloride 0.9 % bolus 1,000 mL (0 mLs Intravenous Stopped 11/26/21 1031)  ondansetron (ZOFRAN) injection 4 mg (4 mg Intravenous Given 11/26/21 0907)  iohexol (OMNIPAQUE) 350 MG/ML injection 80 mL (80 mLs Intravenous Contrast Given 11/26/21 0857)  ondansetron (ZOFRAN) injection 4 mg (4 mg Intravenous Given 11/26/21 1031)    ED Course/ Medical Decision Making/ A&P  CRITICAL CARE Performed by: Milton Ferguson Total critical care time: 40 minutes Critical care time was exclusive of separately billable procedures and treating other patients. Critical care was necessary to treat or prevent imminent or life-threatening deterioration. Critical care was time spent personally by me on the following activities: development of treatment plan with patient and/or surrogate as well  as nursing, discussions with consultants, evaluation of patient's response to treatment, examination of patient, obtaining history from patient or surrogate, ordering and performing treatments and interventions, ordering and review of laboratory studies, ordering and review of radiographic studies, pulse oximetry and re-evaluation of patient's condition.                          Medical Decision Making Amount and/or Complexity of Data Reviewed Labs: ordered. Radiology: ordered.  Risk Prescription drug management. Decision regarding hospitalization.  This patient presents to the ED for concern of vomiting blood, this involves an extensive number of treatment options, and is a complaint that carries with it a high risk of complications and morbidity.  The differential diagnosis includes upper GI bleed   Co morbidities that complicate the patient evaluation  Pancreatitis   Additional history obtained:  Additional history obtained from patient External records from outside source obtained and reviewed including hospital record   Lab  Tests:  I Ordered, and personally interpreted labs.  The pertinent results include: CBC that shows elevated white count 11.7   Imaging Studies ordered:  I ordered imaging studies including CT abdomen I independently visualized and interpreted imaging which showed distended stomach I agree with the radiologist interpretation   Cardiac Monitoring:  The patient was maintained on a cardiac monitor.  I personally viewed and interpreted the cardiac monitored which showed an underlying rhythm of: Normal sinus rhythm   Medicines ordered and prescription drug management:  I ordered medication including Protonix and normal saline Reevaluation of the patient after these medicines showed that the patient improved I have reviewed the patients home medicines and have made adjustments as needed   Test Considered:  None   Critical Interventions:  IV fluids and Protonix   Consultations Obtained:  I requested consultation with the gastroenterologist and hospitalist,  and discussed lab and imaging findings as well as pertinent plan - they recommend: Admit to the hospital and IV treatment with protonic   Problem List / ED Course:  Vomiting blood    Social Determinants of Health:  None       Patient with a upper GI bleed with distention of the esophagus and stomach.  He will be admitted to medicine and started on IV Protonix and GI has been consulted and they will see the patient        Final Clinical Impression(s) / ED Diagnoses Final diagnoses:  Upper GI bleed    Rx / DC Orders ED Discharge Orders     None         Milton Ferguson, MD 11/27/21 1740

## 2021-11-26 NOTE — Assessment & Plan Note (Addendum)
-   Abdomen very tender on exam likely due to chronic pancreatitis with mild acute exacerbation.  Continue IV protonix.  ?

## 2021-11-26 NOTE — Assessment & Plan Note (Deleted)
-   resuming home oral diltiazem ?

## 2021-11-26 NOTE — Assessment & Plan Note (Signed)
-   Patient tested Gastroccult positive ?-Plan IV Protonix twice daily ?-IV fluids ?-Keep n.p.o. for now ?- GI consultation requested ?- Follow daily CBC testing ?

## 2021-11-26 NOTE — ED Notes (Addendum)
1,025m of black fluid emptied from NG tube suction.  ?

## 2021-11-26 NOTE — Assessment & Plan Note (Addendum)
offered CPAP therapy while in hospital ?

## 2021-11-26 NOTE — Assessment & Plan Note (Addendum)
-   had NG initially ?-IV fluids ordered as above ?-IV pain management as needed ?-IV Protonix for GI protection ?--intolerant of po and enteral feeding ?--plan d/c with TPN ?-no further abd pain at time of d/c ?

## 2021-11-26 NOTE — Assessment & Plan Note (Addendum)
-   IV Protonix>>po at time of d/c ?

## 2021-11-27 ENCOUNTER — Encounter (HOSPITAL_COMMUNITY): Payer: Self-pay | Admitting: Family Medicine

## 2021-11-27 ENCOUNTER — Inpatient Hospital Stay: Payer: PPO | Admitting: Gastroenterology

## 2021-11-27 ENCOUNTER — Encounter: Payer: Self-pay | Admitting: Internal Medicine

## 2021-11-27 ENCOUNTER — Encounter (HOSPITAL_COMMUNITY)
Admission: EM | Disposition: A | Discharge status: Home Health | Payer: Self-pay | Source: Home / Self Care | Attending: Internal Medicine

## 2021-11-27 ENCOUNTER — Inpatient Hospital Stay (HOSPITAL_COMMUNITY): Payer: PPO | Admitting: Anesthesiology

## 2021-11-27 DIAGNOSIS — R1013 Epigastric pain: Secondary | ICD-10-CM | POA: Diagnosis not present

## 2021-11-27 DIAGNOSIS — K315 Obstruction of duodenum: Secondary | ICD-10-CM

## 2021-11-27 DIAGNOSIS — T18198A Other foreign object in esophagus causing other injury, initial encounter: Secondary | ICD-10-CM

## 2021-11-27 DIAGNOSIS — I4891 Unspecified atrial fibrillation: Secondary | ICD-10-CM

## 2021-11-27 DIAGNOSIS — K861 Other chronic pancreatitis: Secondary | ICD-10-CM | POA: Diagnosis not present

## 2021-11-27 DIAGNOSIS — K2980 Duodenitis without bleeding: Secondary | ICD-10-CM

## 2021-11-27 DIAGNOSIS — I10 Essential (primary) hypertension: Secondary | ICD-10-CM | POA: Diagnosis not present

## 2021-11-27 DIAGNOSIS — K3189 Other diseases of stomach and duodenum: Secondary | ICD-10-CM

## 2021-11-27 DIAGNOSIS — K269 Duodenal ulcer, unspecified as acute or chronic, without hemorrhage or perforation: Secondary | ICD-10-CM

## 2021-11-27 DIAGNOSIS — K922 Gastrointestinal hemorrhage, unspecified: Secondary | ICD-10-CM | POA: Diagnosis not present

## 2021-11-27 DIAGNOSIS — K2970 Gastritis, unspecified, without bleeding: Secondary | ICD-10-CM

## 2021-11-27 HISTORY — PX: ESOPHAGOGASTRODUODENOSCOPY (EGD) WITH PROPOFOL: SHX5813

## 2021-11-27 LAB — CBC
HCT: 35.1 % — ABNORMAL LOW (ref 39.0–52.0)
Hemoglobin: 10.8 g/dL — ABNORMAL LOW (ref 13.0–17.0)
MCH: 29.8 pg (ref 26.0–34.0)
MCHC: 30.8 g/dL (ref 30.0–36.0)
MCV: 97 fL (ref 80.0–100.0)
Platelets: 371 10*3/uL (ref 150–400)
RBC: 3.62 MIL/uL — ABNORMAL LOW (ref 4.22–5.81)
RDW: 13.8 % (ref 11.5–15.5)
WBC: 10.7 10*3/uL — ABNORMAL HIGH (ref 4.0–10.5)
nRBC: 0 % (ref 0.0–0.2)

## 2021-11-27 LAB — COMPREHENSIVE METABOLIC PANEL
ALT: 14 U/L (ref 0–44)
AST: 15 U/L (ref 15–41)
Albumin: 3 g/dL — ABNORMAL LOW (ref 3.5–5.0)
Alkaline Phosphatase: 65 U/L (ref 38–126)
Anion gap: 9 (ref 5–15)
BUN: 17 mg/dL (ref 8–23)
CO2: 24 mmol/L (ref 22–32)
Calcium: 8.3 mg/dL — ABNORMAL LOW (ref 8.9–10.3)
Chloride: 106 mmol/L (ref 98–111)
Creatinine, Ser: 0.68 mg/dL (ref 0.61–1.24)
GFR, Estimated: >60 mL/min (ref >60)
Glucose, Bld: 128 mg/dL — ABNORMAL HIGH (ref 70–99)
Potassium: 3.3 mmol/L — ABNORMAL LOW (ref 3.5–5.1)
Sodium: 139 mmol/L (ref 135–145)
Total Bilirubin: 0.6 mg/dL (ref 0.3–1.2)
Total Protein: 6 g/dL — ABNORMAL LOW (ref 6.5–8.1)

## 2021-11-27 LAB — MAGNESIUM: Magnesium: 2 mg/dL (ref 1.7–2.4)

## 2021-11-27 SURGERY — ESOPHAGOGASTRODUODENOSCOPY (EGD) WITH PROPOFOL
Anesthesia: General

## 2021-11-27 MED ORDER — LACTATED RINGERS IV SOLN: INTRAVENOUS | Status: DC

## 2021-11-27 MED ORDER — MELATONIN 3 MG PO TABS
6.0000 mg | ORAL_TABLET | Freq: Every day | ORAL | Status: DC
Start: 2021-11-27 — End: 2021-12-02
  Administered 2021-11-27 – 2021-12-01 (×5): 6 mg via ORAL
  Filled 2021-11-27 (×6): qty 2

## 2021-11-27 MED ORDER — PROPOFOL 500 MG/50ML IV EMUL
INTRAVENOUS | Status: DC | PRN
  Administered 2021-11-27: 150 ug/kg/min via INTRAVENOUS

## 2021-11-27 MED ORDER — PROPOFOL 10 MG/ML IV BOLUS
INTRAVENOUS | Status: DC | PRN
Start: 2021-11-27 — End: 2021-11-27
  Administered 2021-11-27: 100 mg via INTRAVENOUS
  Administered 2021-11-27: 20 mg via INTRAVENOUS

## 2021-11-27 MED ORDER — BISACODYL 10 MG RE SUPP
10.0000 mg | Freq: Once | RECTAL | Status: DC
  Filled 2021-11-27: qty 1

## 2021-11-27 MED ORDER — SODIUM CHLORIDE 0.9 % IV SOLN: INTRAVENOUS | Status: DC

## 2021-11-27 MED ORDER — LIDOCAINE HCL (CARDIAC) PF 100 MG/5ML IV SOSY
PREFILLED_SYRINGE | INTRAVENOUS | Status: DC | PRN
Start: 2021-11-27 — End: 2021-11-27
  Administered 2021-11-27: 40 mg via INTRAVENOUS

## 2021-11-27 NOTE — Transfer of Care (Signed)
Immediate Anesthesia Transfer of Care Note ? ?Patient: Sean Golden ? ?Procedure(s) Performed: ESOPHAGOGASTRODUODENOSCOPY (EGD) WITH PROPOFOL ? ?Patient Location: PACU ? ?Anesthesia Type:General ? ?Level of Consciousness: drowsy ? ?Airway & Oxygen Therapy: Patient Spontanous Breathing and Patient connected to nasal cannula oxygen ? ?Post-op Assessment: Report given to RN and Post -op Vital signs reviewed and stable ? ?Post vital signs: Reviewed and stable ? ?Last Vitals:  ?Vitals Value Taken Time  ?BP 106/65   ?Temp    ?Pulse 95   ?Resp 20   ?SpO2 95%   ? ? ?Last Pain:  ?Vitals:  ? 11/27/21 1430  ?TempSrc:   ?PainSc: 0-No pain  ?   ? ?Patients Stated Pain Goal: 0 (11/27/21 0916) ? ?Complications: No notable events documented. ?

## 2021-11-27 NOTE — Progress Notes (Signed)
Patient has NG tube in place and does not want CPAP tonight. ?

## 2021-11-27 NOTE — Op Note (Signed)
[facility] ?Patient Name: Sean Golden ?Procedure Date: 11/27/2021 2:27 PM ?MRN: [id number] ?Date of Birth: Mar 23, 1946 ?Attending MD: Maylon Peppers ,  ?CSN: [id number] ?Age: 76 ?Admit Type: Inpatient ?Procedure:                Upper GI endoscopy ?Indications:              Duodenitis, Nausea with vomiting ?Providers:                Maylon Peppers, Hughie Closs RN, RN, Caprice Kluver,  ?                          Kristine L. Risa Grill, Technician ?Referring MD:              ?Medicines:                Monitored Anesthesia Care ?Complications:            No immediate complications. ?Estimated Blood Loss:     Estimated blood loss: none. ?Procedure:                Pre-Anesthesia Assessment: ?                          - Prior to the procedure, a History and Physical  ?                          was performed, and patient medications, allergies  ?                          and sensitivities were reviewed. The patient's  ?                          tolerance of previous anesthesia was reviewed. ?                          - The risks and benefits of the procedure and the  ?                          sedation options and risks were discussed with the  ?                          patient. All questions were answered and informed  ?                          consent was obtained. ?                          - ASA Grade Assessment: III - A patient with severe  ?                          systemic disease. ?                          After obtaining informed consent, the endoscope was  ?                          passed under direct vision.  Throughout the  ?                          procedure, the patient's blood pressure, pulse, and  ?                          oxygen saturations were monitored continuously. The  ?                          GIF-H190 ([id number]) scope was introduced through the  ?                          mouth, and advanced to the second part of duodenum.  ?                          The upper GI endoscopy was  accomplished without  ?                          difficulty. The patient tolerated the procedure  ?                          well. ?Scope In: 2:39:57 PM ?Scope Out: 2:45:06 PM ?Total Procedure Duration: 0 hours 5 minutes 9 seconds  ?Findings: ?     A gastric tube was found in the entire esophagus. Tip was located in the  ?     gastric proximal gastric body. ?     A 2 cm hiatal hernia was present. ?     Patchy mildly erythematous mucosa was found on the anterior wall of the  ?     stomach and in the gastric antrum. ?     Diffuse severe inflammation characterized by congestion (edema),  ?     erythema and mucus was found in the duodenal bulb. The lumen was  ?     narrowed due to the inflamation in the buld and duodenal sweep, but the  ?     scope could easily pass throuhh this area. ?     The exam of the duodenum was otherwise normal. Upon careful inspection,  ?     no presence of hematin or active bleeding was present. ?Impression:               - A gastric tube was found in the esophagus. ?                          - 2 cm hiatal hernia. ?                          - Erythematous mucosa in the anterior wall of the  ?                          stomach and antrum. ?                          - Duodenitis. ?                          - No specimens collected. ?Moderate Sedation: ?  Per Anesthesia Care ?Recommendation:           - Return patient to hospital ward for ongoing care. ?                          - NPO for now, may attempt advacing clear liquid  ?                          diet if not having signficant output from NG tube. ?                          - Continue pantoprazole 40 mg twice a day IV. ?Procedure Code(s):        --- Professional --- ?                          [phone], Esophagogastroduodenoscopy, flexible,  ?                          transoral; diagnostic, including collection of  ?                          specimen(s) by brushing or washing, when performed  ?                          (separate  procedure) ?Diagnosis Code(s):        --- Professional --- ?                          N39.767H, Other foreign object in esophagus causing  ?                          other injury, initial encounter ?                          K44.9, Diaphragmatic hernia without obstruction or  ?                          gangrene ?                          K31.89, Other diseases of stomach and duodenum ?                          K29.80, Duodenitis without bleeding ?                          R11.2, Nausea with vomiting, unspecified ?CPT copyright 2019 [employer]. All rights reserved. ?The codes documented in this report are preliminary and upon coder review may  ?be revised to meet current compliance requirements. ?Maylon Peppers, MD ?Maylon Peppers,  ?11/27/2021 3:04:51 PM ?This report has been signed electronically. ?Number of Addenda: 0 ?

## 2021-11-27 NOTE — Progress Notes (Signed)
Initial Nutrition Assessment ? ?DOCUMENTATION CODES:  ? ?Obesity unspecified ? ?INTERVENTION:  ?Follow for outcome of EGD and plan for next steps and make additional recommendations. ? ?NUTRITION DIAGNOSIS:  ? ?Inadequate oral intake related to altered GI function as evidenced by per patient/family report, percent weight loss (7% < 1 month)- discharged on full liquid diet. ? ?GOAL:  ?Patient will meet greater than or equal to 90% of their needs ? ? ?MONITOR:  ?Labs, plan for providing nutrition (EN vs PN), BM's ? ?REASON FOR ASSESSMENT:  ? ?Malnutrition Screening Tool ?  ? ?ASSESSMENT: Per GI: 76 y.o. male with a history of A-fib, on Eliquis, HTN, GERD, and necrotizing pancreatitis.  He was discharged on 11/12/2021 after prolonged hospitalization.  He was readmitted on 11/16/2021 for hematemesis in the setting of Eliquis, he underwent EGD on 11/17/2021 with evidence of hematin in gastric fundus status post suctioning, NG trauma in the antrum, normal pylorus, and nonbleeding duodenal ulcer without stigmata of bleeding, and duodenitis with noncritical luminal narrowing involving second portion of the duodenum.  He had CT on 225 with contrast which revealed interval worsening of complicated/necrotizing pancreatitis, with interval increase in peripancreatic fluid and inflammation, and development of adjacent acute necrotic collections.  He was then discharged on 11/19/2021 with twice daily PPI, and full liquid diet restriction.  ? ?Patient presented on 3/6 with c/o abdominal pain, vomiting and hiccups. NGT placed-right nare and ~ 2 liters output.  ? ?Patient sitting up on bedside. Wife present. Patient is scheduled for EGD today. GI has discussed options for nutrition provision nasoduodenal tube vs TPN to allow bowel rest. Nasoduodenal tube preferred method if MD and patient agreeable.  ?  ? ?Intake/Output Summary (Last 24 hours) at 11/27/2021 1428 ?Last data filed at 11/27/2021 1300 ?Gross per 24 hour  ?Intake 1100 ml  ?Output  2650 ml  ?Net -1550 ml  ?  ? ?Weight change: 7% (8.6 kg) < 30 days/  116.6 kg to current 108 kg - significant loss. ?  ?Medications reviewed and include: dulcolax, protonix ?  ?Labs reviewed: Potassium 3.3 (replace). HgB-10.7 (L) ? ?NUTRITION - FOCUSED PHYSICAL EXAM: ?NFPE conducted findings are no fat depletion, mild temporal/deltoid muscle depletion and mild edema.  ? ? ?Diet Order:   ?Diet Order   ? ?       ?  Diet NPO time specified  Diet effective now       ?  ? ?  ?  ? ?  ? ? ?EDUCATION NEEDS:  ?Education needs have been addressed ? ?Skin:  Skin Assessment: Reviewed RN Assessment ? ?Last BM:  3/6 ? ?Height:  ? ?Ht Readings from Last 1 Encounters:  ?11/26/21 '5\' 10"'$  (1.778 m)  ? ? ?Weight:  ? ?Wt Readings from Last 1 Encounters:  ?11/26/21 108 kg  ? ? ?Ideal Body Weight:   75 kg ? ?BMI:  Body mass index is 34.15 kg/m?. ? ?Estimated Nutritional Needs:  ? ?Kcal:  2200-2300 ? ?Protein:  105-114 gr ? ?Fluid:  >2 liters daily ? ?Colman Cater MS,RD,CSG,LDN ?Contact: [facility] ?

## 2021-11-27 NOTE — Progress Notes (Signed)
After I performed the EGD today, I met with the patient, family members (wife and daughter).  We discussed in detail for close to 30 minutes recurrent situation with the patient this patient (severe necrotizing pancreatitis complicated by adjacent inflammation leading to duodenitis with narrowing of the lumen).  After we discussed the etiology and natural history of his current disorder, as well as the treatment options which includes advancing NJ tube versus TPN versus trying again liquid diet as tolerated versus J-tube placement, the patient reported again that he will would not like to pursue NJ placement as this may be an option that this not functional for him and endoscopic ultrasound very uncomfortable.  He would like to try a clear liquid diet for now, we can attempt checking on Thursday afternoon if he has any significant residuals.  He will think about options such as TPN versus J-tube as well. ? ? Castaneda, MD ?Gastroenterology and Hepatology ?[facility] ? ?

## 2021-11-27 NOTE — TOC Initial Note (Signed)
Transition of Care (TOC) - Initial/Assessment Note  ? ? ?Patient Details  ?Name: Sean Golden ?MRN: [id number] ?Date of Birth: 1946-03-04 ? ?Transition of Care (TOC) CM/SW Contact:    ?Iona Beard, LCSWA ?Phone Number: ?11/27/2021, 10:51 AM ? ?Clinical Narrative:                 ?Pt is high risk for readmission. CSW spoke with pts wife who was in room with pt to complete assessment. Pt is independent in completing his ADLs. Pt has not been driving due to many hospital admissions recently but pts wife states he will likely return driving in the near future. Pt was set up with [facility] for [facility] PT/OT/RN services at last admission in Feb. CSW reached out to [place] with [facility] who states that they will be able to accept pt back for service at the time of discharge. Pt has a cane, walker, shower bench, and bsc to use when needed in the home. Pt is not on O2 at home but does have a Cpap machine to use if needed. TOC to follow.  ? ?Expected Discharge Plan: [place] ?Barriers to Discharge: Continued Medical Work up ? ? ?Patient Goals and CMS Choice ?Patient states their goals for this hospitalization and ongoing recovery are:: Home with HH ?CMS Medicare.gov Compare Post Acute Care list provided to:: Patient Represenative (must comment) ?Choice offered to / list presented to : Spouse, Patient ? ?Expected Discharge Plan and Services ?Expected Discharge Plan: [place] ?In-house Referral: Clinical Social Work ?Discharge Planning Services: CM Consult ?Post Acute Care Choice: Home Health ?Living arrangements for the past 2 months: [place] ?                ?  ?  ?  ?  ?  ?HH Arranged: RN, PT, OT ?[place] Agency: [place] ?  ?  ?Representative spoke with at [place]: Marjory Lies ? ?Prior Living Arrangements/Services ?Living arrangements for the past 2 months: [place] ?Lives with:: Spouse ?Patient language and need for interpreter reviewed:: Yes ?Do you feel safe  going back to the place where you live?: Yes      ?Need for Family Participation in Patient Care: No (Comment) ?Care giver support system in place?: Yes (comment) ?Current home services: DME ?Criminal Activity/Legal Involvement Pertinent to Current Situation/Hospitalization: No - Comment as needed ? ?Activities of Daily Living ?Home Assistive Devices/Equipment: Cane (specify quad or straight), Walker (specify type), Bedside commode/3-in-1, Shower chair with back, Eyeglasses ?ADL Screening (condition at time of admission) ?Patient's cognitive ability adequate to safely complete daily activities?: Yes ?Is the patient deaf or have difficulty hearing?: No ?Does the patient have difficulty seeing, even when wearing glasses/contacts?: No ?Does the patient have difficulty concentrating, remembering, or making decisions?: No ?Patient able to express need for assistance with ADLs?: Yes ?Does the patient have difficulty dressing or bathing?: No ?Independently performs ADLs?: Yes (appropriate for developmental age) ?Communication: Independent ?Dressing (OT): Independent ?Grooming: Independent ?Feeding: Independent ?Bathing: Independent ?Toileting: Independent ?In/Out Bed: Independent ?Walks in Home: Independent ?Does the patient have difficulty walking or climbing stairs?: No ?Weakness of Legs: None ?Weakness of Arms/Hands: None ? ?Permission Sought/Granted ?  ?  ?   ?   ?   ?   ? ?Emotional Assessment ?Appearance:: Appears stated age ?  ?  ?Orientation: : Oriented to Self, Oriented to Place, Oriented to  Time, Oriented to Situation ?Alcohol / Substance Use: Not Applicable ?  Psych Involvement: No (comment) ? ?Admission diagnosis:  Acute upper GI hemorrhage [K92.2] ?Upper GI bleed [K92.2] ?Patient Active Problem List  ? Diagnosis Date Noted  ? Acute upper GI hemorrhage 11/26/2021  ? Intractable nausea and vomiting 11/26/2021  ? Gastric outlet obstruction 11/26/2021  ? Chronic pancreatitis ([place]) 11/26/2021  ? Hematemesis  11/16/2021  ? Leukocytosis 11/08/2021  ? Morbidly obese ([place]) 11/07/2021  ? Hiccups 11/06/2021  ? Fluid overload 11/06/2021  ? SOB (shortness of breath)   ? Acute pancreatitis   ? Epigastric pain   ? Metabolic acidosis 80/99/8338  ? AKI (acute kidney injury) ([place]) 10/29/2021  ? Acute necrotizing pancreatitis 10/28/2021  ? Hypokalemia 10/28/2021  ? Transaminitis with hyperbilirubinemia 10/28/2021  ? Lactic acidosis 10/28/2021  ? Polycythemia 10/28/2021  ? Bandemia 10/28/2021  ? Obesity with sleep apnea 10/28/2021  ? 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 Impaired fasting glucose 07/23/2021  ? Mixed hyperlipidemia 07/23/2021  ? COVID-19 04/30/2021  ? Acute respiratory failure with hypoxia ([place]) 10/27/2019  ? Elevated glucose 10/27/2019  ? Helicobacter pylori gastritis 12/06/2014  ? History of colonic polyps 12/06/2014  ? Rectal bleeding 08/24/2014  ? Gastroesophageal reflux disease 08/24/2014  ? Paroxysmal atrial fibrillation ([place]) 05/27/2014  ? Aortic root enlargement ([place]) 05/27/2014  ? Sleep apnea 05/27/2014  ? Atrial flutter ([place]) 05/18/2009  ? FATIGUE / MALAISE 05/18/2009  ? Essential hypertension 05/09/2009  ? ?PCP:  Celene Squibb, MD ?Pharmacy:   ?[facility], [place] ?[address] ?[place] [address] ?Phone: [phone] Fax: [phone] ? ?[facility] [phone] - [place], [place] AT [place] ?[address] ?[place] [address] ?Phone: [phone] Fax: [phone] ? ? ? ? ?Social Determinants of Health (SDOH) Interventions ?  ? ?Readmission Risk Interventions ?Readmission Risk Prevention Plan 11/27/2021 11/19/2021 11/02/2021  ?Transportation Screening Complete Complete Complete  ?PCP or Specialist Appt within 5-7 Days - Complete -  ?Home Care Screening Complete Complete Complete  ?Medication Review (RN CM) Complete Complete Complete  ?Some recent data might be hidden  ? ? ? ?

## 2021-11-27 NOTE — Progress Notes (Signed)
PROGRESS NOTE   Sean Golden  FBP:[id number] DOB: May 15, 1946 DOA: 11/26/2021 PCP: Celene Squibb, MD   Chief Complaint  Patient presents with   Emesis   Level of care: Telemetry  Brief Admission History:  76 year old gentleman who has been dealing with a difficult situation after being hospitalized for necrotizing pancreatitis requiring a 16-day hospitalization.  He was discharged again on 11/19/2021 after a brief admission for coffee-ground emesis.  He was seen by GI at that time and had EGD done with findings of nonbleeding duodenal ulceration.  He was treated with clear liquids and then advance to full liquids and discharged home on a full liquid diet.  He was supposed to advance his diet to soft after 1 week and follow-up with GI within 1 week.  He returned to the ED today complaining of vomiting abdominal pain and hiccups since around 2200 last night.  His emesis was Gastroccult positive.  His rectal exam was guaiac negative.  He had findings of hypokalemia and leukocytosis.  He was sent for another CT scan 11/26/21 with findings of moderate to severe distension of the distal esophagus and stomach with a transition point in the duodenum and findings of duodenal stricture and new findings of stomach distention.  GI was consulted and recommended starting IV Protonix, IV nausea medication and IV fluids and keep n.p.o. at this time.  11/27/2021: NGT was placed on 3/6 with 4L of black fluid out.  EGD with findings of diffuse severe inflammation in the duodenal bulb with a narrowed lumen. Remain NPO for now per GI, Continue IV protonix BID.    Assessment and Plan: * Acute upper GI hemorrhage - Patient tested Gastroccult positive -Plan IV Protonix twice daily -IV fluids -Keep n.p.o. for now - GI consultation requested - Follow daily CBC testing  Chronic pancreatitis ([place]) - He appears to have developed chronic pancreatitis at this point - Keep n.p.o. for now -IV fluids ordered as above -IV pain  management as needed -IV Protonix for GI protection  Gastric outlet obstruction - N.p.o. for now - IV fluids and IV Protonix twice daily -GI consultation requested -Suspect exacerbated by severe inflammation at duodenal bulb.   Intractable nausea and vomiting - Aggressive supportive measures as ordered  Hematemesis - Holding all apixaban - IV nausea medication ordered -IV Protonix ordered twice daily  Epigastric pain - Abdomen very tender on exam likely due to chronic pancreatitis with mild acute exacerbation.  Continue IV protonix.   Gastroesophageal reflux disease - IV Protonix as ordered  Leukocytosis Reactive, WBC down to 10 today.   Hiccups - improved after NGT was placed to low suction  Hypokalemia - Check magnesium and replace as needed -IV replacement ordered -Recheck in a.m.  Mixed hyperlipidemia - Currently diet controlled not on medication  Sleep apnea - We will offer CPAP therapy while in hospital  Paroxysmal atrial fibrillation ([place]) - resuming home oral diltiazem  Atrial flutter (HCC) Resume Cardizem CD to 40 mg daily for heart rate control Holding apixaban temporarily  Essential hypertension Well-controlled following closely  DVT prophylaxis: SCD Code Status: full  Family Communication: wife at bedside updated 3/7 Disposition: Status is: Inpatient Remains inpatient appropriate because: IV protonix required, IV fluids required   Consultants:  GI service  Procedures:  EGD 3/7 with Dr. Jenetta Downer Antimicrobials:    Subjective: Overall feels better after NG was placed.  Objective: Vitals:   11/27/21 1448 11/27/21 1500 11/27/21 1515 11/27/21 1530  BP: 106/65 117/73 127/77 121/71  Pulse:  97 79 97 93  Resp: 19 (!) 21 (!) 21 19  Temp:      TempSrc:      SpO2: 95% 96% 91% 96%  Weight:      Height:        Intake/Output Summary (Last 24 hours) at 11/27/2021 1830 Last data filed at 11/27/2021 1447 Gross per 24 hour  Intake 1200 ml  Output  700 ml  Net 500 ml   Filed Weights   11/26/21 0744  Weight: 108 kg   Examination:  General exam: NG in place, Appears calm and comfortable  Respiratory system: Clear to auscultation. Respiratory effort normal. Cardiovascular system: normal S1 & S2 heard. No JVD, murmurs, rubs, gallops or clicks. No pedal edema. Gastrointestinal system: Abdomen is nondistended, soft and nontender. No organomegaly or masses felt. Normal bowel sounds heard. Central nervous system: Alert and oriented. No focal neurological deficits. Extremities: Symmetric 5 x 5 power. Skin: No rashes, lesions or ulcers. Psychiatry: Judgement and insight appear normal. Mood & affect appropriate.   Data Reviewed: I have personally reviewed following labs and imaging studies  CBC: Recent Labs  Lab 11/26/21 0754 11/27/21 0559  WBC 11.7* 10.7*  NEUTROABS 7.8*  --   HGB 13.4 10.8*  HCT 40.8 35.1*  MCV 94.4 97.0  PLT 500* 458    Basic Metabolic Panel: Recent Labs  Lab 11/26/21 0754 11/27/21 0559  NA 134* 139  K 3.2* 3.3*  CL 97* 106  CO2 24 24  GLUCOSE 157* 128*  BUN 17 17  CREATININE 0.83 0.68  CALCIUM 9.3 8.3*  MG 2.0 2.0    CBG: No results for input(s): GLUCAP in the last 168 hours.  No results found for this or any previous visit (from the past 240 hour(s)).   Radiology Studies: CT ABDOMEN PELVIS W CONTRAST  Result Date: 11/26/2021 CLINICAL DATA:  Abdominal pain, acute, nonlocalized. EXAM: CT ABDOMEN AND PELVIS WITH CONTRAST TECHNIQUE: Multidetector CT imaging of the abdomen and pelvis was performed using the standard protocol following bolus administration of intravenous contrast. RADIATION DOSE REDUCTION: This exam was performed according to the departmental dose-optimization program which includes automated exposure control, adjustment of the mA and/or kV according to patient size and/or use of iterative reconstruction technique. CONTRAST:  15m OMNIPAQUE IOHEXOL 350 MG/ML SOLN COMPARISON:   11/17/2021 FINDINGS: Lower chest: Small pleural effusions have resolved since the previous examination. Residual densities at the right lung base are most compatible with scarring or atelectasis. Hepatobiliary: Normal appearance of the liver with mild intrahepatic biliary dilatation. Normal appearance of the gallbladder. No significant extrahepatic biliary dilatation. Pancreas: Again noted are inflammatory changes surrounding the pancreas with stranding and low-density collections. Again noted is a prominent bilobed low-density collection along the pancreatic tail that measures 8.9 x 3.4 cm on sequence 2 image 40 and roughly measured 8.8 x 3.4 cm on 11/17/2021. Decreased low-density collections along the anterior aspect of the pancreatic neck region. Low-density collection or pseudocysts just anterior to the IVC on sequence 2 image 27 measures 3.8 x 1.8 cm and previously measured 5.0 x 2.2 cm. Low-density collection between the duodenum and right hepatic lobe on sequence 2 image 38 measures 4.3 x 2.5 cm and previously measured 4.9 x 3.5 cm. Again noted is a complex collection in the right abdomen that is contiguous with peripancreatic inflammation and contains gas. This gas containing collection roughly measures 4.8 x 2.5 cm and previously measured 6.1 x 3.3 cm. Patchy areas of enhancement in the pancreas compatible with  underlying pancreatic necrosis. Spleen: Normal in size without focal abnormality. Adrenals/Urinary Tract: Normal appearance of the adrenal glands. Normal appearance of both kidneys without hydronephrosis. 0.7 cm structure along the anterior left kidney on sequence 2 image 30 is too small to characterize and indeterminate. This probably represents a small hyperdense cyst based on a CT from 11/01/2021. Additional small low-density in left kidney lower pole probably represent a cyst. Normal appearance of the urinary bladder. Stomach/Bowel: Distal esophagus is distended with fluid. Stomach is very  distended and contains a large amount of fluid. There is a transition point in the descending duodenum. Distal duodenum is decompressed. Small bowel is decompressed. Extensive diverticulosis involving the sigmoid colon and descending colon. Normal appendix. Vascular/Lymphatic: Splenic vein is small and surrounded by the pancreatic inflammation. However, there appears to be flow within the splenic vein. The portal vein confluence is patent. Main portal venous system is patent. Atherosclerotic calcifications in the abdominal aorta without aneurysm. Main visceral arteries are patent. No significant lymph node enlargement in the abdomen or pelvis. Reproductive: Prostate contains calcifications. Other: Negative for free fluid. Negative for free air. Musculoskeletal: Disc space narrowing at L5-S1. No acute bone abnormality. Umbilical hernia containing fat. IMPRESSION: 1. Moderate to severe distension of the distal esophagus and stomach with a transition point in the duodenum. Findings could be related to obstruction or stricture in the duodenum secondary to the peripancreatic inflammatory changes. This stomach distension is new from the previous examination. 2. Residual inflammatory changes around the pancreas compatible with pancreatitis or sequela of pancreatitis. Again noted are multiple peripancreatic collections compatible with pseudocysts. Majority of these collections have slightly decreased in size with exception of the collection near the pancreatic tail which has minimally changed. Again noted is gas involving the collection in the right abdomen lateral to the duodenum. 3. Abdominal venous structures remain patent although the splenic vein is small. 4. Small pleural effusions have resolved. 5. Colonic diverticulosis. 6. Umbilical hernia. Electronically Signed   By: Markus Daft M.D.   On: 11/26/2021 09:31   DG Chest Portable 1 View  Result Date: 11/26/2021 CLINICAL DATA:  NG tube placement. EXAM: PORTABLE CHEST  1 VIEW COMPARISON:  Chest radiograph and chest CT 10/30/2021. CT abdomen and pelvis 11/26/2021. FINDINGS: An enteric tube courses into the abdomen with side hole overlying the gastric body and tip not imaged. The cardiomediastinal silhouette is unchanged. Lung volumes are low with bibasilar opacities. There is blunting of the costophrenic angles bilaterally without pleural effusions on today's earlier abdominal CT. No pneumothorax is identified. No acute osseous abnormality is seen. IMPRESSION: 1. Enteric tube reaching the stomach. 2. Low lung volumes with bibasilar atelectasis. Electronically Signed   By: Logan Bores M.D.   On: 11/26/2021 14:27    Scheduled Meds:  bisacodyl  10 mg Rectal Once   brimonidine  1 drop Right Eye BID   diltiazem  240 mg Oral Daily   pantoprazole (PROTONIX) IV  40 mg Intravenous Q12H   tamsulosin  0.8 mg Oral QPM   Continuous Infusions:  sodium chloride 100 mL/hr at 11/27/21 1550     LOS: 1 day   Time spent: 70 mins   Wynetta Emery, MD How to contact the [facility] Attending or Consulting provider [place] or covering provider during after hours [place], for this patient?  Check the care team in  [facility] and look for a) attending/consulting [facility] provider listed and b) the [facility] team listed Log into [email or web address] and use [place]'s universal password to  access. If you do not have the password, please contact the hospital operator. Locate the [facility] provider you are looking for under [facility] and page to a number that you can be directly reached. If you still have difficulty reaching the provider, please page the [facility] (Director on Call) for the Hospitalists listed on amion for assistance.  11/27/2021, 6:30 PM

## 2021-11-27 NOTE — Assessment & Plan Note (Addendum)
Due to stress demargination ?Stable and afebrile off antibiotics ?Overall improved ?

## 2021-11-27 NOTE — Brief Op Note (Signed)
11/26/2021 - 11/27/2021 ? ?2:58 PM ? ?PATIENT:  Sean Golden  76 y.o. male ? ?PRE-OPERATIVE DIAGNOSIS:  hematemesis, duodenal ulcer, duodenal stenosis ? ?POST-OPERATIVE DIAGNOSIS:  Gastritis; edema in the small bowel; small bowel ulcerations;  ? ?PROCEDURE:  Procedure(s): ?ESOPHAGOGASTRODUODENOSCOPY (EGD) WITH PROPOFOL (N/A) ? ?SURGEON:  Surgeon(s) and Role: ?   Harvel Quale, MD - Primary ? ?Patient underwent EGD under propofol sedation.  Tolerated the procedure adequately.  A gastric tube was found in the entire esophagus. Tip was located in the gastric proximal gastric body. Esophagus showed 2 cm hiatal hernia.  Patchy mildly erythematous mucosa was found on the anterior wall of the stomach and in the gastric antrum. Diffuse severe inflammation characterized by congestion (edema), erythema and mucus was found in the duodenal bulb.  The lumen was narrowed due to the inflamation in the buld and duodenal sweep, but the scope could easily pass throuhh this area. The exam of the duodenum was otherwise normal. Upon careful inspection, no presence of hematin or active bleeding was present. ? ?RECOMMENDATIONS ?- Return patient to hospital ward for ongoing care.  ?- NPO for now, may attempt advacing clear liquid diet if not having signficant output from NG tube. ?- Continue pantoprazole 40 mg twice a day IV. ? ?Maylon Peppers, MD ?Gastroenterology and Hepatology ?[facility] ? ?

## 2021-11-27 NOTE — Anesthesia Preprocedure Evaluation (Addendum)
Anesthesia Evaluation  ?Patient identified by MRN, date of birth, ID band ?Patient awake ? ? ? ?Reviewed: ?Allergy & Precautions, H&P , NPO status , Patient's Chart, lab work & pertinent test results, reviewed documented beta blocker date and time  ? ?Airway ?Mallampati: II ? ?TM Distance: >3 FB ?Neck ROM: full ? ? ? Dental ?no notable dental hx. ? ?  ?Pulmonary ?sleep apnea , former smoker,  ?  ?Pulmonary exam normal ?breath sounds clear to auscultation ? ? ? ? ? ? Cardiovascular ?Exercise Tolerance: Good ?hypertension, + dysrhythmias Atrial Fibrillation  ?Rhythm:regular Rate:Normal ? ? ?  ?Neuro/Psych ?negative neurological ROS ? negative psych ROS  ? GI/Hepatic ?Neg liver ROS, GERD  Medicated,  ?Endo/Other  ?negative endocrine ROS ? Renal/GU ?negative Renal ROS  ?negative genitourinary ?  ?Musculoskeletal ? ? Abdominal ?  ?Peds ? Hematology ?negative hematology ROS ?(+)   ?Anesthesia Other Findings ? ? Reproductive/Obstetrics ?negative OB ROS ? ?  ? ? ? ? ? ? ? ? ? ? ? ? ? ?  ?  ? ? ? ? ? ? ? ? ?Anesthesia Physical ?Anesthesia Plan ? ?ASA: 3 and emergent ? ?Anesthesia Plan: General  ? ?Post-op Pain Management:   ? ?Induction:  ? ?PONV Risk Score and Plan: Propofol infusion ? ?Airway Management Planned:  ? ?Additional Equipment:  ? ?Intra-op Plan:  ? ?Post-operative Plan:  ? ?Informed Consent: I have reviewed the patients History and Physical, chart, labs and discussed the procedure including the risks, benefits and alternatives for the proposed anesthesia with the patient or authorized representative who has indicated his/her understanding and acceptance.  ? ? ? ?Dental Advisory Given ? ?Plan Discussed with: CRNA ? ?Anesthesia Plan Comments:   ? ? ? ? ? ?Anesthesia Quick Evaluation ? ?

## 2021-11-27 NOTE — Progress Notes (Signed)
? ?  Gastroenterology Progress Note  ? ?Referring Provider: No ref. provider found ?Primary Care Physician:  Celene Squibb, MD ?Primary Gastroenterologist:  Dr. Laural Golden ? ?Patient ID: Sean Golden; [id number]; [birth year]  ? ?76 y.o. male with a history of A-fib, on Eliquis, HTN, GERD, and necrotizing pancreatitis.  He was discharged on 11/12/2021 after prolonged hospitalization.  He was readmitted on 11/16/2021 for hematemesis in the setting of Eliquis, he underwent EGD on 11/17/2021 with evidence of hematin in gastric fundus status post suctioning, NG trauma in the antrum, normal pylorus, and nonbleeding duodenal ulcer without stigmata of bleeding, and duodenitis with noncritical luminal narrowing involving second portion of the duodenum.  He had CT on 225 with contrast which revealed interval worsening of complicated/necrotizing pancreatitis, with interval increase in peripancreatic fluid and inflammation, and development of adjacent acute necrotic collections.  He was then discharged on 11/19/2021 with twice daily PPI, and full liquid diet restriction.  He presented to the ED again on 3/6 with hematemesis, CT with findings of severe distention of the distal esophagus and stomach with a transition point into the duodenum which was new from prior, residual inflammatory changes around the pancreas, with majority of the pseudocyst collections having decreased in size with the exception of the collection near the pancreatic tail, with noted gas lateral to the duodenum.  An NG tube was placed which immediately put out 1100 cc of light brown liquid.  ? ?Discussed with wife and the patient recommendations from Dr. Tivis Ringer that he does not feel as though the cyst on the pancreatic tail is causing him to have a duodenal outlet obstruction, he feels as though this this is of inflammatory etiology, evaluating the site is not unreasonable and likely he would need feedings distal to the site of stenosis in order to provide rest  and healing and if it can not be achieved then would need TPN for nutrition and bowel rest. Also does not feel cystogastrostomy would be helpful at this time. ?We discussed possible nasoduodenal tube versus TPN for bowel rest and nutrition.  We also discussed potential risks of TPN and inability to determine for how long he would need either of these therapies. ? ?Patient reports that he is feeling okay today.  He has denied any nausea today.  He reports that he has a little bit of an appetite.  Reports that he has had continued hiccups when taking medications.  He also reports some constipation with straining over the last few days.  ? ?Drop in hemoglobin to 10.8 from 13.4 today, and given almost total of 2 L output from NG tube, discussed patient with Dr. Jenetta Downer and he will undergo EGD today.  ? ?We will give bisacodyl suppository today for constipation. ? ? LOS: 1 day  ? ? 11/27/2021, 8:24 AM ? ? ?Venetia Night, MSN, FNP-BC, AGACNP-BC ?[facility] ? ? ?

## 2021-11-28 ENCOUNTER — Inpatient Hospital Stay: Payer: Self-pay

## 2021-11-28 DIAGNOSIS — K922 Gastrointestinal hemorrhage, unspecified: Secondary | ICD-10-CM | POA: Diagnosis not present

## 2021-11-28 DIAGNOSIS — I10 Essential (primary) hypertension: Secondary | ICD-10-CM | POA: Diagnosis not present

## 2021-11-28 DIAGNOSIS — D62 Acute posthemorrhagic anemia: Secondary | ICD-10-CM | POA: Diagnosis not present

## 2021-11-28 DIAGNOSIS — K920 Hematemesis: Secondary | ICD-10-CM

## 2021-11-28 DIAGNOSIS — K311 Adult hypertrophic pyloric stenosis: Secondary | ICD-10-CM | POA: Diagnosis not present

## 2021-11-28 DIAGNOSIS — I4821 Permanent atrial fibrillation: Secondary | ICD-10-CM

## 2021-11-28 LAB — CBC
HCT: 33.8 % — ABNORMAL LOW (ref 39.0–52.0)
Hemoglobin: 10.9 g/dL — ABNORMAL LOW (ref 13.0–17.0)
MCH: 31.5 pg (ref 26.0–34.0)
MCHC: 32.2 g/dL (ref 30.0–36.0)
MCV: 97.7 fL (ref 80.0–100.0)
Platelets: 354 10*3/uL (ref 150–400)
RBC: 3.46 MIL/uL — ABNORMAL LOW (ref 4.22–5.81)
RDW: 13.9 % (ref 11.5–15.5)
WBC: 12.6 10*3/uL — ABNORMAL HIGH (ref 4.0–10.5)
nRBC: 0 % (ref 0.0–0.2)

## 2021-11-28 LAB — BASIC METABOLIC PANEL
Anion gap: 9 (ref 5–15)
BUN: 12 mg/dL (ref 8–23)
CO2: 22 mmol/L (ref 22–32)
Calcium: 8.2 mg/dL — ABNORMAL LOW (ref 8.9–10.3)
Chloride: 106 mmol/L (ref 98–111)
Creatinine, Ser: 0.68 mg/dL (ref 0.61–1.24)
GFR, Estimated: >60 mL/min (ref >60)
Glucose, Bld: 124 mg/dL — ABNORMAL HIGH (ref 70–99)
Potassium: 3.3 mmol/L — ABNORMAL LOW (ref 3.5–5.1)
Sodium: 137 mmol/L (ref 135–145)

## 2021-11-28 MED ORDER — POLYETHYLENE GLYCOL 3350 17 G PO PACK
17.0000 g | PACK | Freq: Every day | ORAL | Status: DC
  Administered 2021-11-28 – 2021-12-01 (×2): 17 g via ORAL
  Filled 2021-11-28 (×5): qty 1

## 2021-11-28 MED ORDER — PHENOL 1.4 % MT LIQD
1.0000 | OROMUCOSAL | Status: DC | PRN
  Administered 2021-11-28: 1 via OROMUCOSAL
  Filled 2021-11-28: qty 177

## 2021-11-28 NOTE — Progress Notes (Signed)
Subjective: Patient states he is doing okay this morning, tolerated liquids earlier, denies nausea. No BMs today.  Notably, patient's wife and daughter present during my assessment. Patient tells me he is very unhappy with what happened to him yesterday, stating he was given options of different ways to proceed with his care to include NG tube, NJ tube or PICC line with TPN. States that these were explained to them, the next thing he knows he is being taken down for an EGD which he was not aware he was having and that NG tube was placed after he had stated he did not wish to proceed with that. He tells me that he communicated his desires to have the PICC line placed, however, he was brought a liquid diet which he was also not happy with as the last 2 times he has been admitted for his pancreatitis, he goes home on a liquid diet and comes back in 4-5 days with nausea and vomiting, therefore, he feels this will recur again if he eats or drinks anything. Patient and family members are all notably upset and feel that patient's wishes/concerns are not being heard.  Objective: Vital signs in last 24 hours: Temp:  [97.8 F (36.6 C)-98.4 F (36.9 C)] 98 F (36.7 C) (03/08 0510) Pulse Rate:  [79-97] 92 (03/08 0510) Resp:  [16-22] 16 (03/08 0510) BP: (106-141)/(65-98) 138/90 (03/08 0510) SpO2:  [91 %-98 %] 94 % (03/08 0510) Last BM Date : 11/26/21 General:   Alert and oriented, pleasant Head:  Normocephalic and atraumatic. Eyes:  No icterus, sclera clear. Conjuctiva pink.  Mouth:  Without lesions, mucosa pink and moist.  Neck:  Supple, without thyromegaly or masses.  Heart:  S1, S2 present, no murmurs noted.  Lungs: Clear to auscultation bilaterally, without wheezing, rales, or rhonchi.  Abdomen:  Bowel sounds present, soft, non-tender, non-distended. No HSM or hernias noted. No rebound or guarding. No masses appreciated. NG tube in place, not currently hooked to suction. Msk:  Symmetrical without  gross deformities. Normal posture. Pulses:  Normal pulses noted. Extremities:  Without clubbing or edema. Neurologic:  Alert and  oriented x4;  grossly normal neurologically. Skin:  Warm and dry, intact without significant lesions.  Psych:  Alert and cooperative. Normal mood and affect.  Intake/Output from previous day: 03/07 0701 - 03/08 0700 In: 200 [I.V.:200] Out: 950 [Urine:800; Emesis/NG output:150]  Lab Results: Recent Labs    11/26/21 0754 11/27/21 0559 11/28/21 0525  WBC 11.7* 10.7* 12.6*  HGB 13.4 10.8* 10.9*  HCT 40.8 35.1* 33.8*  PLT 500* 371 354   BMET Recent Labs    11/26/21 0754 11/27/21 0559 11/28/21 0525  NA 134* 139 137  K 3.2* 3.3* 3.3*  CL 97* 106 106  CO2 '24 24 22  '$ GLUCOSE 157* 128* 124*  BUN '17 17 12  '$ CREATININE 0.83 0.68 0.68  CALCIUM 9.3 8.3* 8.2*   LFT Recent Labs    11/26/21 0754 11/27/21 0559  PROT 7.5 6.0*  ALBUMIN 3.6 3.0*  AST 21 15  ALT 19 14  ALKPHOS 78 65  BILITOT 0.8 0.6    Studies/Results: DG Chest Portable 1 View  Result Date: 11/26/2021 CLINICAL DATA:  NG tube placement. EXAM: PORTABLE CHEST 1 VIEW COMPARISON:  Chest radiograph and chest CT 10/30/2021. CT abdomen and pelvis 11/26/2021. FINDINGS: An enteric tube courses into the abdomen with side hole overlying the gastric body and tip not imaged. The cardiomediastinal silhouette is unchanged. Lung volumes are low with bibasilar opacities. There  is blunting of the costophrenic angles bilaterally without pleural effusions on today's earlier abdominal CT. No pneumothorax is identified. No acute osseous abnormality is seen. IMPRESSION: 1. Enteric tube reaching the stomach. 2. Low lung volumes with bibasilar atelectasis. Electronically Signed   By: Logan Bores M.D.   On: 11/26/2021 14:27    Assessment: 76 year old male with a history of A-fib, on Eliquis, HTN, GERD, and necrotizing pancreatitis.  Recently discharged on 11/12/2021 after prolonged hospitalization.  Readmitted on  11/16/2021 for hematemesis in the setting of Eliquis, he underwent EGD on 11/17/2021 with evidence of hematin in gastric fundus status post suctioning, NG trauma in the antrum, normal pylorus, and nonbleeding duodenal ulcer without stigmata of bleeding, and duodenitis with noncritical luminal narrowing involving second portion of the duodenum.  He had CT on 2/25 with contrast which revealed interval worsening of complicated/necrotizing pancreatitis, with interval increase in peripancreatic fluid and inflammation, and development of adjacent acute necrotic collections.  He was then discharged on 11/19/2021 with twice daily PPI, and full liquid diet.  He presented to the ED again on 3/6 with hematemesis, CT with findings of severe distention of the distal esophagus and stomach with a transition point into the duodenum which was new from prior, residual inflammatory changes around the pancreas, with majority of the pseudocyst collections having decreased in size with the exception of the collection near the pancreatic tail, with noted gas lateral to the duodenum.  An NG tube was placed which immediately put out 1100 cc of light brown liquid.   Hemoglobin dropped from 13.4 to 10.8 yesterday with almost 2L of output from NG tube. EGD was performed with findings of 2 cm hiatal hernia, Erythematous mucosa in the anterior wall of the stomach and antrum, and Duodenitis, No specimens were collected. Recommendations to continue with PPI BID.   Discussion was had yesterday between Dr. Jenetta Downer, patient and family after EGD regarding Findings and treatment options of advancing NJ tube, TPN, Liquid diet, or J tube placement. Per EMR documentation, Patient preferred to try clear liquid diet at this time, with further evaluation of tolerance of liquids later this week and discussion thereafter of possible other routes of supplemental nutrition if PO liquids were not tolerated.  Thorough discussion had between myself, patient's  daughter and patient's wife, present at bedside. Patient and family expressed they are very disgruntled today, feel that patient voiced his wishes to not proceed with anything PO and have further discussion regarding placement of PICC line with TPN administration. Stated that they did not feel EGD was warranted yesterday and are not sure why patient had NG tube placed when he specifically stated he did not wish to have this. Patient's daughter expressed concern that she felt that we were "trying to kill patient" as he has had recurrent admission for the same symptoms after he goes home on liquid diet and returns 4-5 days with nausea, vomiting and pain. She does not understand why he continues to get anything by mouth. Patient tells me that he feels at this time that PICC line placement and TPN is his best option based on the discussions that were had yesterday as he is not going to go home with an NG tube and does not want any procedures for a feeding tube to be placed. He does not understand why his wishes are not being taken into consideration. I verbalized understanding of patient and family's concerns. We discussed risks and benefits of PICC line placement. I did make him  aware that given the complexity of his case, everyone involved in his care is providing the recommendations that are felt safest and most effective for him, based on clinical knowledge and judgement. I reassured patient and family that ultimately patient is the one who makes the decision about what procedures he feels comfortable with doing or not doing, however, it is our job to make sure he is informed the best that we can about indications risks and benefits of all options that are appropriate for him. Patient and family verbalized understanding.   I discussed with both Dr. Jenetta Downer and Dr. Carles Collet regarding patient and family's concerns, as well as his wishes to further discuss placement of PICC line for TPN administration as he does not  wish to remain on liquid diet or have NG tube in place. All team members are aware of patient's wishes at this time.   Plan: Continue PPI BID Possible PICC line placement for TPN administration Patient can be NPO except for sips with meds as some of his meds will not be available in other routes other than PO on outpatient basis Trend H&H daily Monitor for Overt GI bleeding Continue to hold anticoags   LOS: 2 days    11/28/2021, 9:27 AM    L. Alver Sorrow, MSN, APRN, AGNP-C Adult-Gerontology Nurse Practitioner [facility]

## 2021-11-28 NOTE — Progress Notes (Addendum)
PICC order placement received. Spoke to primary RN re: PICC order for TNA.  RN aware PICC will be placed tomorrow. Patient has working PIV. ?

## 2021-11-28 NOTE — Assessment & Plan Note (Addendum)
Hgb dropped form 13.4>>10.8>>10.9>>10.1>>9.7>>9.6 ?Now appears stabilized after period of equilibration ?Continue PPI ?No signs of active blood loss presently ?Restart apixaban 12/01/21 ?Hgb stable 72+ hours prior to d/c without signs of active bleed ?

## 2021-11-28 NOTE — Assessment & Plan Note (Addendum)
Holding apixaban due to bleeding ?Continue diltiazem CD ?HRs largely controlled ?Restart apixaban when ok with GI>>restart 12/01/21 ?

## 2021-11-28 NOTE — Anesthesia Postprocedure Evaluation (Signed)
Anesthesia Post Note ? ?Patient: Sean Golden ? ?Procedure(s) Performed: ESOPHAGOGASTRODUODENOSCOPY (EGD) WITH PROPOFOL ? ?Patient location during evaluation: Phase II ?Anesthesia Type: General ?Level of consciousness: awake ?Pain management: pain level controlled ?Vital Signs Assessment: post-procedure vital signs reviewed and stable ?Respiratory status: spontaneous breathing and respiratory function stable ?Cardiovascular status: blood pressure returned to baseline and stable ?Postop Assessment: no headache and no apparent nausea or vomiting ?Anesthetic complications: no ?Comments: Late entry ? ? ?No notable events documented. ? ? ?Last Vitals:  ?Vitals:  ? 11/27/21 2129 11/28/21 0510  ?BP: 130/83 138/90  ?Pulse: 94 92  ?Resp: 19 16  ?Temp: 36.9 ?C 36.7 ?C  ?SpO2: 94% 94%  ?  ?Last Pain:  ?Vitals:  ? 11/28/21 0510  ?TempSrc: Oral  ?PainSc:   ? ? ?  ?  ?  ?  ?  ?  ? ?[name] ? ? ? ? ?

## 2021-11-28 NOTE — Progress Notes (Signed)
Messaged MD via secure chat to ask about removing pt's NG tube. MD was in agreement that ng tube could be removed as long as there isn't a significant amount of drainage. Drainage was approx. 100.  Removed pt NG tube at 1723. Pt tolerated well. MD made aware. ?

## 2021-11-28 NOTE — TOC Progression Note (Addendum)
Transition of Care (TOC) - Progression Note  ? ? ?Patient Details  ?Name: Sean Golden ?MRN: [id number] ?Date of Birth: 03/29/1946 ? ?Transition of Care (TOC) CM/SW Contact  ?Salome Arnt, LCSW ?Phone Number: ?11/28/2021, 3:31 PM ? ?Clinical Narrative: Per MD, pt will get PICC tomorrow for TPN. LCSW spoke with Carolynn Sayers with [facility]. She will follow up in AM.    ? ? ? ?Expected Discharge Plan: [place] ?Barriers to Discharge: Continued Medical Work up ? ?Expected Discharge Plan and Services ?Expected Discharge Plan: [place] ?In-house Referral: Clinical Social Work ?Discharge Planning Services: CM Consult ?Post Acute Care Choice: Home Health ?Living arrangements for the past 2 months: [place] ?                ?  ?  ?  ?  ?  ?HH Arranged: RN, PT, OT ?[place] Agency: [place] ?  ?  ?Representative spoke with at [place]: Marjory Lies ? ? ?Social Determinants of Health (SDOH) Interventions ?  ? ?Readmission Risk Interventions ?Readmission Risk Prevention Plan 11/27/2021 11/19/2021 11/02/2021  ?Transportation Screening Complete Complete Complete  ?PCP or Specialist Appt within 5-7 Days - Complete -  ?Home Care Screening Complete Complete Complete  ?Medication Review (RN CM) Complete Complete Complete  ?Some recent data might be hidden  ? ? ?

## 2021-11-28 NOTE — Progress Notes (Signed)
PROGRESS NOTE  Sean Golden SHF:[id number] DOB: June 27, 1946 DOA: 11/26/2021 PCP: Celene Squibb, MD  Brief History:  76 year old gentleman who has been dealing with a difficult situation after being hospitalized for necrotizing pancreatitis requiring a 16-day hospitalization from 10/27/2021 to 11/12/2021.  He was readmitted from 11/16/2021 to 11/19/2021 secondary to hematemesis.  He underwent EGD on 11/17/2021 with evidence of hematin in gastric fundus status post suctioning, NG trauma in the antrum, normal pylorus, and nonbleeding duodenal ulcer without stigmata of bleeding, and duodenitis with noncritical luminal narrowing involving second portion of the duodenum. He was treated with clear liquids and then advance to full liquids and discharged home on a full liquid diet.  He was supposed to advance his diet to soft after 1 week and follow-up with GI within 1 week.  He returned to the ED on 11/26/21 complaining of vomiting abdominal pain and hiccups.   His emesis was Gastroccult positive.  His rectal exam was guaiac negative.  He had findings of hypokalemia and leukocytosis.  He was sent for another CT scan 11/26/21 with findings of moderate to severe distension of the distal esophagus and stomach with a transition point in the duodenum and findings of duodenal stricture and new findings of stomach distention.  GI was consulted and recommended starting IV Protonix, IV nausea medication and IV fluids and keep n.p.o. at this time.  11/27/2021: NGT was placed on 3/6 with 1100cc dark liquid.   EGD  was performed on 11/27/21    Assessment/Plan:   Active Problems:   Intractable nausea and vomiting   Gastric outlet obstruction   Chronic pancreatitis ([place])   Essential hypertension   Sleep apnea   Gastroesophageal reflux disease   Hypokalemia   Leukocytosis   Hematemesis   Acute blood loss anemia   Permanent atrial fibrillation (HCC)  Assessment and Plan: Chronic pancreatitis ([place]) - He appears to  have developed chronic pancreatitis at this point - Keep n.p.o. for now -IV fluids ordered as above -IV pain management as needed -IV Protonix for GI protection  Gastric outlet obstruction - N.p.o. for now -NG placed as discussed above - IV fluids and IV Protonix twice daily -GI consultation appreciated -exacerbated by severe inflammation at duodenal bulb.  -11/27/21 EGD--diffuse severe infl and edema in duodenal bulb; lumen narrowed secondary to inflammation in bulb and duodenal sweep 3/8--long discussion with family>>they prefer PICC line and TPN -can take essential meds with sips of water  Intractable nausea and vomiting - Aggressive supportive measures as ordered -improved with NG decompression -take meds with sips of water  Permanent atrial fibrillation (HCC) Holding apixaban due to bleeding Continue diltiazem CD HRs largely controlled  Acute blood loss anemia Hgb dropped form 13.4>>10.8>>10.9 Now appears stabilized Continue PPI CBC in am  Hematemesis - Holding apixaban -IV Protonix ordered twice daily  Leukocytosis Due to stress demargination  Hypokalemia - Check magnesium and replace as needed -IV replacement ordered -Recheck in a.m.  Gastroesophageal reflux disease - IV Protonix as ordered  Sleep apnea - We will offer CPAP therapy while in hospital  Essential hypertension Controlled Continue diltiazem CD  Paroxysmal atrial fibrillation (HCC)-resolved as of 11/28/2021 - resuming home oral diltiazem         Status is: Inpatient Remains inpatient appropriate because: severity of illness and intolerance of po    Family Communication:   spouse and daughter at bedside 3/8  Consultants:  GI  Code Status:  FULL  DVT Prophylaxis:  apixaban on hold   Procedures: As Listed in Progress Note Above  Antibiotics: None      Subjective: Patient denies fevers, chills, headache, chest pain, dyspnea, nausea, vomiting, diarrhea, abdominal pain,  dysuria, hematuria, hematochezia, and melena.   Objective: Vitals:   11/27/21 1515 11/27/21 1530 11/27/21 2129 11/28/21 0510  BP: 127/77 121/71 130/83 138/90  Pulse: 97 93 94 92  Resp: (!) '21 19 19 16  '$ Temp:   98.4 F (36.9 C) 98 F (36.7 C)  TempSrc:   Oral Oral  SpO2: 91% 96% 94% 94%  Weight:      Height:        Intake/Output Summary (Last 24 hours) at 11/28/2021 1332 Last data filed at 11/28/2021 0600 Gross per 24 hour  Intake 200 ml  Output 750 ml  Net -550 ml   Weight change:  Exam:  General:  Pt is alert, follows commands appropriately, not in acute distress HEENT: No icterus, No thrush, No neck mass, [place]/AT Cardiovascular: RRR, S1/S2, no rubs, no gallops Respiratory: CTA bilaterally, no wheezing, no crackles, no rhonchi Abdomen: Soft/+BS, non tender, non distended, no guarding Extremities: No edema, No lymphangitis, No petechiae, No rashes, no synovitis   Data Reviewed: I have personally reviewed following labs and imaging studies Basic Metabolic Panel: Recent Labs  Lab 11/26/21 0754 11/27/21 0559 11/28/21 0525  NA 134* 139 137  K 3.2* 3.3* 3.3*  CL 97* 106 106  CO2 '24 24 22  '$ GLUCOSE 157* 128* 124*  BUN '17 17 12  '$ CREATININE 0.83 0.68 0.68  CALCIUM 9.3 8.3* 8.2*  MG 2.0 2.0  --    Liver Function Tests: Recent Labs  Lab 11/26/21 0754 11/27/21 0559  AST 21 15  ALT 19 14  ALKPHOS 78 65  BILITOT 0.8 0.6  PROT 7.5 6.0*  ALBUMIN 3.6 3.0*   Recent Labs  Lab 11/26/21 0754  LIPASE 36   No results for input(s): AMMONIA in the last 168 hours. Coagulation Profile: No results for input(s): INR, PROTIME in the last 168 hours. CBC: Recent Labs  Lab 11/26/21 0754 11/27/21 0559 11/28/21 0525  WBC 11.7* 10.7* 12.6*  NEUTROABS 7.8*  --   --   HGB 13.4 10.8* 10.9*  HCT 40.8 35.1* 33.8*  MCV 94.4 97.0 97.7  PLT 500* 371 354   Cardiac Enzymes: No results for input(s): CKTOTAL, CKMB, CKMBINDEX, TROPONINI in the last 168 hours. BNP: Invalid input(s):  POCBNP CBG: No results for input(s): GLUCAP in the last 168 hours. HbA1C: No results for input(s): HGBA1C in the last 72 hours. Urine analysis:    Component Value Date/Time   COLORURINE YELLOW 05/22/2021 1707   APPEARANCEUR HAZY (A) 05/22/2021 1707   LABSPEC 1.017 05/22/2021 1707   PHURINE 5.0 05/22/2021 1707   GLUCOSEU NEGATIVE 05/22/2021 1707   HGBUR NEGATIVE 05/22/2021 1707   BILIRUBINUR NEGATIVE 05/22/2021 1707   KETONESUR NEGATIVE 05/22/2021 1707   PROTEINUR NEGATIVE 05/22/2021 1707   NITRITE NEGATIVE 05/22/2021 1707   LEUKOCYTESUR NEGATIVE 05/22/2021 1707   Sepsis Labs: '@LABRCNTIP'$ (procalcitonin:4,lacticidven:4) )No results found for this or any previous visit (from the past 240 hour(s)).   Scheduled Meds:  bisacodyl  10 mg Rectal Once   brimonidine  1 drop Right Eye BID   diltiazem  240 mg Oral Daily   melatonin  6 mg Oral QHS   pantoprazole (PROTONIX) IV  40 mg Intravenous Q12H   polyethylene glycol  17 g Oral Daily   tamsulosin  0.8 mg Oral QPM  Continuous Infusions:  sodium chloride 100 mL/hr at 11/27/21 1550    Procedures/Studies: CT CHEST WO CONTRAST  Result Date: 10/30/2021 CLINICAL DATA:  Pneumonia, complication suspected. Respiratory distress, hypoxia, tachypnea. EXAM: CT CHEST WITHOUT CONTRAST TECHNIQUE: Multidetector CT imaging of the chest was performed following the standard protocol without IV contrast. RADIATION DOSE REDUCTION: This exam was performed according to the departmental dose-optimization program which includes automated exposure control, adjustment of the mA and/or kV according to patient size and/or use of iterative reconstruction technique. COMPARISON:  10/28/2021 FINDINGS: Cardiovascular: Mild aortic and coronary artery calcifications. No aortic aneurysm. Normal heart size. No pericardial effusions. Mediastinum/Nodes: Thyroid gland is unremarkable. Scattered mediastinal lymph nodes are not pathologically enlarged. Esophagus is decompressed.  Lungs/Pleura: New development of bilateral basilar atelectasis or consolidation. No pleural effusions. No pneumothorax. Airways are patent. Upper Abdomen: Inflammatory changes again demonstrated in the visualized pancreas although incompletely included. There is hazy stranding around the pancreatic head and body with soft tissue gas. The gas collections are new since prior study and may represent necrotizing infection or fistula. CT abdomen and pelvis suggested for further evaluation. Musculoskeletal: Degenerative changes in the spine. IMPRESSION: 1. New development of bilateral basilar consolidation or atelectasis in the lungs. 2. Incomplete visualization of changes of pancreatitis in the upper abdomen. New finding of peripancreatic gas suggesting necrotizing infection or fistula. CT abdomen and pelvis suggested for further evaluation. 3. Aortic atherosclerosis. Electronically Signed   By: Lucienne Capers M.D.   On: 10/30/2021 20:00   CT ABDOMEN PELVIS W CONTRAST  Result Date: 11/26/2021 CLINICAL DATA:  Abdominal pain, acute, nonlocalized. EXAM: CT ABDOMEN AND PELVIS WITH CONTRAST TECHNIQUE: Multidetector CT imaging of the abdomen and pelvis was performed using the standard protocol following bolus administration of intravenous contrast. RADIATION DOSE REDUCTION: This exam was performed according to the departmental dose-optimization program which includes automated exposure control, adjustment of the mA and/or kV according to patient size and/or use of iterative reconstruction technique. CONTRAST:  65m OMNIPAQUE IOHEXOL 350 MG/ML SOLN COMPARISON:  11/17/2021 FINDINGS: Lower chest: Small pleural effusions have resolved since the previous examination. Residual densities at the right lung base are most compatible with scarring or atelectasis. Hepatobiliary: Normal appearance of the liver with mild intrahepatic biliary dilatation. Normal appearance of the gallbladder. No significant extrahepatic biliary  dilatation. Pancreas: Again noted are inflammatory changes surrounding the pancreas with stranding and low-density collections. Again noted is a prominent bilobed low-density collection along the pancreatic tail that measures 8.9 x 3.4 cm on sequence 2 image 40 and roughly measured 8.8 x 3.4 cm on 11/17/2021. Decreased low-density collections along the anterior aspect of the pancreatic neck region. Low-density collection or pseudocysts just anterior to the IVC on sequence 2 image 27 measures 3.8 x 1.8 cm and previously measured 5.0 x 2.2 cm. Low-density collection between the duodenum and right hepatic lobe on sequence 2 image 38 measures 4.3 x 2.5 cm and previously measured 4.9 x 3.5 cm. Again noted is a complex collection in the right abdomen that is contiguous with peripancreatic inflammation and contains gas. This gas containing collection roughly measures 4.8 x 2.5 cm and previously measured 6.1 x 3.3 cm. Patchy areas of enhancement in the pancreas compatible with underlying pancreatic necrosis. Spleen: Normal in size without focal abnormality. Adrenals/Urinary Tract: Normal appearance of the adrenal glands. Normal appearance of both kidneys without hydronephrosis. 0.7 cm structure along the anterior left kidney on sequence 2 image 30 is too small to characterize and indeterminate. This probably represents a small  hyperdense cyst based on a CT from 11/01/2021. Additional small low-density in left kidney lower pole probably represent a cyst. Normal appearance of the urinary bladder. Stomach/Bowel: Distal esophagus is distended with fluid. Stomach is very distended and contains a large amount of fluid. There is a transition point in the descending duodenum. Distal duodenum is decompressed. Small bowel is decompressed. Extensive diverticulosis involving the sigmoid colon and descending colon. Normal appendix. Vascular/Lymphatic: Splenic vein is small and surrounded by the pancreatic inflammation. However, there  appears to be flow within the splenic vein. The portal vein confluence is patent. Main portal venous system is patent. Atherosclerotic calcifications in the abdominal aorta without aneurysm. Main visceral arteries are patent. No significant lymph node enlargement in the abdomen or pelvis. Reproductive: Prostate contains calcifications. Other: Negative for free fluid. Negative for free air. Musculoskeletal: Disc space narrowing at L5-S1. No acute bone abnormality. Umbilical hernia containing fat. IMPRESSION: 1. Moderate to severe distension of the distal esophagus and stomach with a transition point in the duodenum. Findings could be related to obstruction or stricture in the duodenum secondary to the peripancreatic inflammatory changes. This stomach distension is new from the previous examination. 2. Residual inflammatory changes around the pancreas compatible with pancreatitis or sequela of pancreatitis. Again noted are multiple peripancreatic collections compatible with pseudocysts. Majority of these collections have slightly decreased in size with exception of the collection near the pancreatic tail which has minimally changed. Again noted is gas involving the collection in the right abdomen lateral to the duodenum. 3. Abdominal venous structures remain patent although the splenic vein is small. 4. Small pleural effusions have resolved. 5. Colonic diverticulosis. 6. Umbilical hernia. Electronically Signed   By: Markus Daft M.D.   On: 11/26/2021 09:31   CT ABDOMEN PELVIS W CONTRAST  Result Date: 11/17/2021 CLINICAL DATA:  Recent hospitalization for necrotizing pancreatitis. Follow-up study. EXAM: CT ABDOMEN AND PELVIS WITH CONTRAST TECHNIQUE: Multidetector CT imaging of the abdomen and pelvis was performed using the standard protocol following bolus administration of intravenous contrast. RADIATION DOSE REDUCTION: This exam was performed according to the departmental dose-optimization program which includes  automated exposure control, adjustment of the mA and/or kV according to patient size and/or use of iterative reconstruction technique. CONTRAST:  158m OMNIPAQUE IOHEXOL 300 MG/ML  SOLN COMPARISON:  11/01/2021. FINDINGS: Lower chest: Small pleural effusions associated with mild dependent lower lobe atelectasis. Atelectasis improved since the prior CT. Hepatobiliary: Liver normal in size and overall attenuation. Tiny low-attenuation lesion in the left lobe, too small to characterize, likely a cyst. No other liver masses or lesions. Normal gallbladder. Common bile duct measures 8 mm, increased when compared to the prior CT. No intrahepatic bile duct dilation. No evidence of a duct stone. Pancreas: Peripancreatic fluid surrounds the pancreas. There are interspersed areas of decreased enhancement along the tail suggesting necrosis. Inflammatory type stranding extends from peripancreatic fluid along the gastro duodenal ligament and anterior pararenal fascia. Lobulated fluid collection is seen extending from the pancreatic tail adjacent to the left anterior pararenal fascia, measuring 8.1 x 2.9 x 3.7 cm, new since the prior CT. There is a second less well-defined fluid collection just anterior and superior to the pancreatic tail, measuring approximately 5.6 x 2.7 x 2.6 cm. Small fluid collections lie adjacent to the pancreatic head. The largest of these, which lies between the duodenum and inferior margin of the liver, is ill-defined, measuring approximately 4.7 x 3.6 x 3.5 cm. Splenic vein is attenuated, but not thrombosed/occluded. Portal vein is widely  patent. Superior mesenteric vein is widely patent. Spleen: Normal in size without focal abnormality. Adrenals/Urinary Tract: Adrenal glands are unremarkable. Kidneys are normal, without renal calculi, focal lesion, or hydronephrosis. Bladder is unremarkable. Stomach/Bowel: Mild inflammation along the distal gastric antrum. Stomach is otherwise unremarkable. Reactive  inflammatory changes noted along the duodenum. Small bowel and colon are normal in caliber. No wall thickening or additional inflammation. Numerous colonic diverticula. Normal appendix visualized. Vascular/Lymphatic: Aortic atherosclerosis. No aneurysm. No discrete enlarged lymph nodes. Reproductive: Unremarkable. Other: Small fat containing umbilical hernia. Musculoskeletal: No fracture or acute finding.  No bone lesion. IMPRESSION: 1. Interval worsening of complicated/necrotizing pancreatitis, with an interval increase in peripancreatic fluid and inflammation and development adjacent acute necrotic collections. No evidence of venous thrombosis. 2. No other acute abnormality within the abdomen or pelvis. Electronically Signed   By: Lajean Manes M.D.   On: 11/17/2021 13:46   CT ABDOMEN LIMITED WO CONTRAST  Result Date: 11/01/2021 CLINICAL DATA:  Necrotizing pancreatitis, foci of gas in pancreatic bed and in anterior pararenal spaces bilaterally greater on RIGHT, question due to necrotizing pancreatitis versus bowel/duodenal perforation EXAM: CT ABDOMEN WITHOUT CONTRAST LIMITED TECHNIQUE: Multidetector CT imaging of the abdomen was performed following the standard protocol without IV contrast. Patient drank water-soluble contrast for this exam. RADIATION DOSE REDUCTION: This exam was performed according to the departmental dose-optimization program which includes automated exposure control, adjustment of the mA and/or kV according to patient size and/or use of iterative reconstruction technique. COMPARISON:  Earlier study 11/01/2021 FINDINGS: Lower chest: Bibasilar pleural effusions and atelectasis Hepatobiliary: Gallbladder and liver normal appearance Pancreas: Significantly enlarged and edematous pancreas with diffuse infiltration of peripancreatic fat planes consistent with acute pancreatitis. Few foci of gas again seen at the pancreatic head/body region. No abnormal fluid collections. No mass or definite  hemorrhage. Spleen: Normal appearance Adrenals/Urinary Tract: Adrenal glands, kidneys, and proximal ureters unremarkable. Stomach/Bowel: Contrast opacifies the stomach, duodenal bulb, proximal descending duodenum, and third portion of duodenum as well as multiple jejunal loops. Bowel wall thickening of second and third portions of duodenum. No contrast extravasation is identified into the peripancreatic tissue planes or the anterior pararenal space to suggest duodenal perforation/ulcer. Diverticulosis of descending and distal transverse colon noted. Vascular/Lymphatic: Atherosclerotic calcifications aorta. No adenopathy. Other: No free intraperitoneal air.  No hernia. Musculoskeletal: Osseous structures unremarkable. IMPRESSION: No extravasation of GI contrast into pancreatic bed or anterior pararenal spaces to suggest duodenal perforation. Persistent visualization of edema and foci of gas at the pancreatic head/body and in the anterior pararenal spaces bilaterally greater on RIGHT, likely reflecting necrotizing pancreatitis. Severe pancreatitis changes again seen. Electronically Signed   By: Lavonia Dana M.D.   On: 11/01/2021 16:38   DG Chest Portable 1 View  Result Date: 11/26/2021 CLINICAL DATA:  NG tube placement. EXAM: PORTABLE CHEST 1 VIEW COMPARISON:  Chest radiograph and chest CT 10/30/2021. CT abdomen and pelvis 11/26/2021. FINDINGS: An enteric tube courses into the abdomen with side hole overlying the gastric body and tip not imaged. The cardiomediastinal silhouette is unchanged. Lung volumes are low with bibasilar opacities. There is blunting of the costophrenic angles bilaterally without pleural effusions on today's earlier abdominal CT. No pneumothorax is identified. No acute osseous abnormality is seen. IMPRESSION: 1. Enteric tube reaching the stomach. 2. Low lung volumes with bibasilar atelectasis. Electronically Signed   By: Logan Bores M.D.   On: 11/26/2021 14:27   DG CHEST PORT 1  VIEW  Result Date: 10/30/2021 CLINICAL DATA:  Hypoxia EXAM: PORTABLE CHEST  1 VIEW COMPARISON:  10/29/2021 FINDINGS: Bibasilar opacities, likely atelectasis. Suspect small effusions. Heart is normal size. No acute bony abnormality. IMPRESSION: Small bilateral effusions with bibasilar atelectasis. Electronically Signed   By: Rolm Baptise M.D.   On: 10/30/2021 01:04   ECHOCARDIOGRAM COMPLETE  Result Date: 10/31/2021    ECHOCARDIOGRAM REPORT   Patient Name:   Sean Golden Date of Exam: 10/31/2021 Medical Rec #:  [id number]      Height:       70.0 in Accession #:    [id number]     Weight:       273.1 lb Date of Birth:  08-30-1946     BSA:          2.383 m Patient Age:    67 years       BP:           118/62 mmHg Patient Gender: M              HR:           113 bpm. Exam Location:  [facility] Procedure: 2D Echo, Cardiac Doppler and Color Doppler Indications:    Dyspnea  History:        Patient has prior history of Echocardiogram examinations, most                 recent 06/09/2014. Arrythmias:Atrial Fibrillation and Atrial                 Flutter; Risk Factors:Hypertension, Dyslipidemia and Former                 Smoker.  Sonographer:    Wenda Low Referring Phys: [id number] Charlesetta Ivory GONFA  Sonographer Comments: Patient is morbidly obese. Image acquisition challenging due to respiratory motion. Patient is on Bi-Pap IMPRESSIONS  1. Left ventricular ejection fraction, by estimation, is 60 to 65%. The left ventricle has normal function. The left ventricle has no regional wall motion abnormalities. There is moderate left ventricular hypertrophy. Left ventricular diastolic parameters are indeterminate.  2. Right ventricular systolic function is normal. The right ventricular size is normal. There is normal pulmonary artery systolic pressure.  3. Left atrial size was mildly dilated.  4. Prominent epicardial adipose tissue.  5. The mitral valve is normal in structure. No evidence of mitral valve regurgitation. No evidence of  mitral stenosis.  6. The aortic valve is tricuspid. There is mild calcification of the aortic valve. Aortic valve regurgitation is not visualized. Aortic valve sclerosis is present, with no evidence of aortic valve stenosis.  7. The inferior vena cava is normal in size with greater than 50% respiratory variability, suggesting right atrial pressure of 3 mmHg. FINDINGS  Left Ventricle: Left ventricular ejection fraction, by estimation, is 60 to 65%. The left ventricle has normal function. The left ventricle has no regional wall motion abnormalities. The left ventricular internal cavity size was normal in size. There is  moderate left ventricular hypertrophy. Left ventricular diastolic parameters are indeterminate. Right Ventricle: The right ventricular size is normal. No increase in right ventricular wall thickness. Right ventricular systolic function is normal. There is normal pulmonary artery systolic pressure. The tricuspid regurgitant velocity is 2.36 m/s, and  with an assumed right atrial pressure of 3 mmHg, the estimated right ventricular systolic pressure is 81.0 mmHg. Left Atrium: Left atrial size was mildly dilated. Right Atrium: Right atrial size was normal in size. Pericardium: Prominent epicardial adipose tissue. There is no evidence of pericardial effusion. Mitral Valve: The  mitral valve is normal in structure. No evidence of mitral valve regurgitation. No evidence of mitral valve stenosis. MV peak gradient, 5.5 mmHg. The mean mitral valve gradient is 2.0 mmHg. Tricuspid Valve: The tricuspid valve is normal in structure. Tricuspid valve regurgitation is not demonstrated. No evidence of tricuspid stenosis. Aortic Valve: The aortic valve is tricuspid. There is mild calcification of the aortic valve. Aortic valve regurgitation is not visualized. Aortic valve sclerosis is present, with no evidence of aortic valve stenosis. Aortic valve mean gradient measures 4.5 mmHg. Aortic valve peak gradient measures 10.3  mmHg. Aortic valve area, by VTI measures 2.73 cm. Pulmonic Valve: The pulmonic valve was normal in structure. Pulmonic valve regurgitation is not visualized. No evidence of pulmonic stenosis. Aorta: The aortic root is normal in size and structure. Venous: The inferior vena cava is normal in size with greater than 50% respiratory variability, suggesting right atrial pressure of 3 mmHg. IAS/Shunts: No atrial level shunt detected by color flow Doppler.  LEFT VENTRICLE PLAX 2D LVIDd:         3.50 cm   Diastology LVIDs:         2.10 cm   LV e' medial:    15.40 cm/s LV PW:         1.20 cm   LV E/e' medial:  7.5 LV IVS:        1.50 cm   LV e' lateral:   13.30 cm/s LVOT diam:     2.10 cm   LV E/e' lateral: 8.7 LV SV:         71 LV SV Index:   30 LVOT Area:     3.46 cm  RIGHT VENTRICLE RV Basal diam:  3.55 cm RV Mid diam:    2.70 cm RV S prime:     16.43 cm/s LEFT ATRIUM             Index        RIGHT ATRIUM           Index LA diam:        4.30 cm 1.80 cm/m   RA Area:     22.00 cm LA Vol (A2C):   54.0 ml 22.66 ml/m  RA Volume:   69.50 ml  29.17 ml/m LA Vol (A4C):   69.4 ml 29.12 ml/m LA Biplane Vol: 67.5 ml 28.33 ml/m  AORTIC VALVE                     PULMONIC VALVE AV Area (Vmax):    2.72 cm      PV Vmax:       1.08 m/s AV Area (Vmean):   2.67 cm      PV Peak grad:  4.7 mmHg AV Area (VTI):     2.73 cm AV Vmax:           160.50 cm/s AV Vmean:          100.300 cm/s AV VTI:            0.260 m AV Peak Grad:      10.3 mmHg AV Mean Grad:      4.5 mmHg LVOT Vmax:         126.00 cm/s LVOT Vmean:        77.200 cm/s LVOT VTI:          0.205 m LVOT/AV VTI ratio: 0.79  AORTA Ao Root diam: 3.20 cm MITRAL VALVE  TRICUSPID VALVE MV Area (PHT): 3.17 cm     TR Peak grad:   22.3 mmHg MV Area VTI:   3.48 cm     TR Vmax:        236.00 cm/s MV Peak grad:  5.5 mmHg MV Mean grad:  2.0 mmHg     SHUNTS MV Vmax:       1.17 m/s     Systemic VTI:  0.20 m MV Vmean:      65.2 cm/s    Systemic Diam: 2.10 cm MV Decel Time: 239  msec MV E velocity: 116.00 cm/s Jenkins Rouge MD Electronically signed by Jenkins Rouge MD Signature Date/Time: 10/31/2021/10:20:51 AM    Final    CT PANCREAS ABD W/WO  Result Date: 11/01/2021 CLINICAL DATA:  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 EXAM: CT ABDOMEN WITHOUT AND WITH CONTRAST TECHNIQUE: Multidetector CT imaging of the abdomen was performed following the standard protocol before and following the bolus administration of intravenous contrast. RADIATION DOSE REDUCTION: This exam was performed according to the departmental dose-optimization program which includes automated exposure control, adjustment of the mA and/or kV according to patient size and/or use of iterative reconstruction technique. CONTRAST:  164m OMNIPAQUE IOHEXOL 300 MG/ML  SOLN COMPARISON:  Recent imaging from October 28, 2021. FINDINGS: Lower chest: Small bilateral effusions and basilar volume loss. Hepatobiliary: Hepatic steatosis with lobular hepatic contours. No focal, suspicious hepatic lesion. Hepatic arterial supply derive from the celiac axis and SMA with replaced RIGHT hepatic arterial supply arising from the SMA. Gallbladder with mild surrounding stranding felt to be secondary from pancreatic and retroperitoneal process. The portal vein or remains patent as does the splenic vein. Pancreas: Marked interstitial edematous pancreatitis. Note that the pancreatic parenchyma is poorly enhancing but does enhance, scattered areas of non enhancement are noted. There is extensive surrounding inflammation. No large hematoma. There is however gas that has developed in the LEFT and RIGHT retroperitoneum greatest on the RIGHT. Gas along the celiac and about the neck of the pancreas largely sparing the pancreas proper is new compared to February 5th and incompletely imaged on the recent comparison evaluation. Spleen: Normal. Adrenals/Urinary Tract: Adrenal glands are normal. Symmetric renal enhancement without hydronephrosis. Stomach/Bowel:  Duodenal inflammation persists with surrounding stranding further extending into retroperitoneal fascial planes and associated with gas as described. No signs of pneumatosis. There also very small locules of gas anterior to the duodenum (image 92/11). No pneumoperitoneum. Vascular/Lymphatic: Atherosclerotic changes of the abdominal aorta. No adenopathy in the retroperitoneum. Other: Retroperitoneal gas as described above. Greatest collection of gas along the RIGHT flank posterior to the colon measuring 5.0 x 2.9 cm. Extensive inflammation with worsening extending throughout retroperitoneal fascial planes. Extensive body wall edema. Musculoskeletal: No acute bone finding. No destructive bone process. Spinal degenerative changes. IMPRESSION: 1. 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 2. Dissemination of inflammation throughout the retroperitoneum with gas greater on the RIGHT than the LEFT. Findings are suspicious for infection of peripancreatic necrosis, given rapid development aggressive or necrotizing infection is considered and should be correlated with the patient's clinical condition. 3. Given small locules of gas adjacent to the duodenum and the presence of gas in the retroperitoneum would consider follow-up imaging with enteric contrast media utilizing CT to exclude the possibility of a GI source of gas from bowel compromise particularly of the duodenum. 4. Hepatic steatosis with lobular hepatic contours. 5. Small bilateral effusions and basilar volume loss. 6. Aortic atherosclerosis. Aortic Atherosclerosis (ICD10-I70.0). These  results will be called to the ordering clinician or representative by the Radiologist Assistant, and communication documented in the PACS or [employer]. Electronically Signed   By: Zetta Bills M.D.   On: 11/01/2021 12:11   [place] EKG SITE RITE  Result Date: 11/28/2021 If Site Rite image not attached,  placement could not be confirmed due to current cardiac rhythm.   Orson Eva, DO  [facility]  If 7PM-7AM, please contact night-coverage [email or web address] Password [email or web address] 11/28/2021, 1:32 PM   LOS: 2 days

## 2021-11-29 ENCOUNTER — Ambulatory Visit: Payer: PPO | Admitting: Cardiology

## 2021-11-29 ENCOUNTER — Inpatient Hospital Stay (HOSPITAL_COMMUNITY): Payer: PPO

## 2021-11-29 DIAGNOSIS — D62 Acute posthemorrhagic anemia: Secondary | ICD-10-CM | POA: Diagnosis not present

## 2021-11-29 DIAGNOSIS — K922 Gastrointestinal hemorrhage, unspecified: Secondary | ICD-10-CM | POA: Diagnosis not present

## 2021-11-29 DIAGNOSIS — K920 Hematemesis: Secondary | ICD-10-CM | POA: Diagnosis not present

## 2021-11-29 DIAGNOSIS — K861 Other chronic pancreatitis: Secondary | ICD-10-CM | POA: Diagnosis not present

## 2021-11-29 LAB — PHOSPHORUS: Phosphorus: 2.7 mg/dL (ref 2.5–4.6)

## 2021-11-29 LAB — BASIC METABOLIC PANEL
Anion gap: 11 (ref 5–15)
BUN: 10 mg/dL (ref 8–23)
CO2: 20 mmol/L — ABNORMAL LOW (ref 22–32)
Calcium: 8.1 mg/dL — ABNORMAL LOW (ref 8.9–10.3)
Chloride: 104 mmol/L (ref 98–111)
Creatinine, Ser: 0.62 mg/dL (ref 0.61–1.24)
GFR, Estimated: >60 mL/min (ref >60)
Glucose, Bld: 122 mg/dL — ABNORMAL HIGH (ref 70–99)
Potassium: 3 mmol/L — ABNORMAL LOW (ref 3.5–5.1)
Sodium: 135 mmol/L (ref 135–145)

## 2021-11-29 LAB — CBC
HCT: 31.1 % — ABNORMAL LOW (ref 39.0–52.0)
Hemoglobin: 10.1 g/dL — ABNORMAL LOW (ref 13.0–17.0)
MCH: 31.3 pg (ref 26.0–34.0)
MCHC: 32.5 g/dL (ref 30.0–36.0)
MCV: 96.3 fL (ref 80.0–100.0)
Platelets: 319 10*3/uL (ref 150–400)
RBC: 3.23 MIL/uL — ABNORMAL LOW (ref 4.22–5.81)
RDW: 13.5 % (ref 11.5–15.5)
WBC: 11.5 10*3/uL — ABNORMAL HIGH (ref 4.0–10.5)
nRBC: 0 % (ref 0.0–0.2)

## 2021-11-29 LAB — MAGNESIUM: Magnesium: 1.8 mg/dL (ref 1.7–2.4)

## 2021-11-29 LAB — TRIGLYCERIDES: Triglycerides: 70 mg/dL (ref <150)

## 2021-11-29 MED ORDER — LINACLOTIDE 72 MCG PO CAPS
72.0000 ug | ORAL_CAPSULE | Freq: Every day | ORAL | Status: AC
  Administered 2021-11-29: 18:00:00 72 ug via ORAL
  Filled 2021-11-29 (×2): qty 1

## 2021-11-29 MED ORDER — CHLORHEXIDINE GLUCONATE CLOTH 2 % EX PADS
6.0000 | MEDICATED_PAD | Freq: Every day | CUTANEOUS | Status: DC
  Administered 2021-11-29 – 2021-12-02 (×4): 6 via TOPICAL

## 2021-11-29 MED ORDER — POTASSIUM CHLORIDE 10 MEQ/100ML IV SOLN
10.0000 meq | INTRAVENOUS | Status: AC
  Administered 2021-11-29 (×4): 10 meq via INTRAVENOUS
  Filled 2021-11-29 (×3): qty 100

## 2021-11-29 MED ORDER — POTASSIUM CHLORIDE IN NACL 40-0.9 MEQ/L-% IV SOLN
INTRAVENOUS | Status: AC
Start: 2021-11-29 — End: 2021-11-30

## 2021-11-29 MED ORDER — MAGNESIUM SULFATE 2 GM/50ML IV SOLN
2.0000 g | Freq: Once | INTRAVENOUS | Status: AC
  Administered 2021-11-29: 17:00:00 2 g via INTRAVENOUS
  Filled 2021-11-29: qty 50

## 2021-11-29 MED ORDER — SODIUM CHLORIDE 0.9% FLUSH
10.0000 mL | Freq: Two times a day (BID) | INTRAVENOUS | Status: DC
  Administered 2021-11-29 – 2021-12-02 (×7): 10 mL

## 2021-11-29 MED ORDER — POTASSIUM CHLORIDE 10 MEQ/100ML IV SOLN
INTRAVENOUS | Status: AC
  Filled 2021-11-29: qty 100

## 2021-11-29 MED ORDER — DOCUSATE SODIUM 100 MG PO CAPS
200.0000 mg | ORAL_CAPSULE | Freq: Every day | ORAL | Status: DC
  Administered 2021-11-29 – 2021-12-01 (×3): 200 mg via ORAL
  Filled 2021-11-29 (×3): qty 2

## 2021-11-29 MED ORDER — SODIUM CHLORIDE 0.9% FLUSH: 10.0000 mL | INTRAVENOUS | Status: DC | PRN

## 2021-11-29 NOTE — Progress Notes (Signed)
Sean Golden [birth year] [id number]    Subjective:    Feeling ok. Has the hiccups. No nausea or abdominal pain. No BM in several days and last ones have been hard. Having a hard time drinking the fluid used for miralax, too much at one time. Only tolerating sips.  Objective:   Vital signs in last 24 hours: Temp:  [97.9 F (36.6 C)-99.2 F (37.3 C)] 97.9 F (36.6 C) (03/09 0306) Pulse Rate:  [70-90] 76 (03/09 0306) Resp:  [18-20] 18 (03/09 0306) BP: (114-131)/(75-82) 118/79 (03/09 0306) SpO2:  [94 %-97 %] 97 % (03/09 0306) Last BM Date : 11/26/21 General:   Alert,  Well-developed, well-nourished, pleasant and cooperative in NAD Head:  Normocephalic and atraumatic. Eyes:  Sclera clear, no icterus.  Abdomen:  Soft, nontender and nondistended.  Normal bowel sounds, without guarding, and without rebound.   Extremities:  Without clubbing, deformity or edema. Neurologic:  Alert and  oriented x4;  grossly normal neurologically. Skin:  Intact without significant lesions or rashes. Psych:  Alert and cooperative. Normal mood and affect.  Intake/Output from previous day: 03/08 0701 - 03/09 0700 In: -  Out: 300 [Urine:300] Intake/Output this shift: Total I/O In: -  Out: 275 [Urine:275]  Lab Results: CBC Recent Labs    11/27/21 0559 11/28/21 0525 11/29/21 0541  WBC 10.7* 12.6* 11.5*  HGB 10.8* 10.9* 10.1*  HCT 35.1* 33.8* 31.1*  MCV 97.0 97.7 96.3  PLT 371 354 319   BMET Recent Labs    11/27/21 0559 11/28/21 0525 11/29/21 0541  NA 139 137 135  K 3.3* 3.3* 3.0*  CL 106 106 104  CO2 24 22 20*  GLUCOSE 128* 124* 122*  BUN '17 12 10  '$ CREATININE 0.68 0.68 0.62  CALCIUM 8.3* 8.2* 8.1*   LFTs Recent Labs    11/27/21 0559  BILITOT 0.6  ALKPHOS 65  AST 15  ALT 14  PROT 6.0*  ALBUMIN 3.0*   No results for input(s): LIPASE in the last 72 hours. PT/INR No results for input(s): LABPROT, INR in the last 72 hours.     Imaging Studies: CT CHEST WO  CONTRAST  Result Date: 10/30/2021 CLINICAL DATA:  Pneumonia, complication suspected. Respiratory distress, hypoxia, tachypnea. EXAM: CT CHEST WITHOUT CONTRAST TECHNIQUE: Multidetector CT imaging of the chest was performed following the standard protocol without IV contrast. RADIATION DOSE REDUCTION: This exam was performed according to the departmental dose-optimization program which includes automated exposure control, adjustment of the mA and/or kV according to patient size and/or use of iterative reconstruction technique. COMPARISON:  10/28/2021 FINDINGS: Cardiovascular: Mild aortic and coronary artery calcifications. No aortic aneurysm. Normal heart size. No pericardial effusions. Mediastinum/Nodes: Thyroid gland is unremarkable. Scattered mediastinal lymph nodes are not pathologically enlarged. Esophagus is decompressed. Lungs/Pleura: New development of bilateral basilar atelectasis or consolidation. No pleural effusions. No pneumothorax. Airways are patent. Upper Abdomen: Inflammatory changes again demonstrated in the visualized pancreas although incompletely included. There is hazy stranding around the pancreatic head and body with soft tissue gas. The gas collections are new since prior study and may represent necrotizing infection or fistula. CT abdomen and pelvis suggested for further evaluation. Musculoskeletal: Degenerative changes in the spine. IMPRESSION: 1. New development of bilateral basilar consolidation or atelectasis in the lungs. 2. Incomplete visualization of changes of pancreatitis in the upper abdomen. New finding of peripancreatic gas suggesting necrotizing infection or fistula. CT abdomen and pelvis suggested for further evaluation. 3. Aortic atherosclerosis. Electronically Signed   By: Lucienne Capers  M.D.   On: 10/30/2021 20:00   CT ABDOMEN PELVIS W CONTRAST  Result Date: 11/26/2021 CLINICAL DATA:  Abdominal pain, acute, nonlocalized. EXAM: CT ABDOMEN AND PELVIS WITH CONTRAST  TECHNIQUE: Multidetector CT imaging of the abdomen and pelvis was performed using the standard protocol following bolus administration of intravenous contrast. RADIATION DOSE REDUCTION: This exam was performed according to the departmental dose-optimization program which includes automated exposure control, adjustment of the mA and/or kV according to patient size and/or use of iterative reconstruction technique. CONTRAST:  28m OMNIPAQUE IOHEXOL 350 MG/ML SOLN COMPARISON:  11/17/2021 FINDINGS: Lower chest: Small pleural effusions have resolved since the previous examination. Residual densities at the right lung base are most compatible with scarring or atelectasis. Hepatobiliary: Normal appearance of the liver with mild intrahepatic biliary dilatation. Normal appearance of the gallbladder. No significant extrahepatic biliary dilatation. Pancreas: Again noted are inflammatory changes surrounding the pancreas with stranding and low-density collections. Again noted is a prominent bilobed low-density collection along the pancreatic tail that measures 8.9 x 3.4 cm on sequence 2 image 40 and roughly measured 8.8 x 3.4 cm on 11/17/2021. Decreased low-density collections along the anterior aspect of the pancreatic neck region. Low-density collection or pseudocysts just anterior to the IVC on sequence 2 image 27 measures 3.8 x 1.8 cm and previously measured 5.0 x 2.2 cm. Low-density collection between the duodenum and right hepatic lobe on sequence 2 image 38 measures 4.3 x 2.5 cm and previously measured 4.9 x 3.5 cm. Again noted is a complex collection in the right abdomen that is contiguous with peripancreatic inflammation and contains gas. This gas containing collection roughly measures 4.8 x 2.5 cm and previously measured 6.1 x 3.3 cm. Patchy areas of enhancement in the pancreas compatible with underlying pancreatic necrosis. Spleen: Normal in size without focal abnormality. Adrenals/Urinary Tract: Normal appearance of  the adrenal glands. Normal appearance of both kidneys without hydronephrosis. 0.7 cm structure along the anterior left kidney on sequence 2 image 30 is too small to characterize and indeterminate. This probably represents a small hyperdense cyst based on a CT from 11/01/2021. Additional small low-density in left kidney lower pole probably represent a cyst. Normal appearance of the urinary bladder. Stomach/Bowel: Distal esophagus is distended with fluid. Stomach is very distended and contains a large amount of fluid. There is a transition point in the descending duodenum. Distal duodenum is decompressed. Small bowel is decompressed. Extensive diverticulosis involving the sigmoid colon and descending colon. Normal appendix. Vascular/Lymphatic: Splenic vein is small and surrounded by the pancreatic inflammation. However, there appears to be flow within the splenic vein. The portal vein confluence is patent. Main portal venous system is patent. Atherosclerotic calcifications in the abdominal aorta without aneurysm. Main visceral arteries are patent. No significant lymph node enlargement in the abdomen or pelvis. Reproductive: Prostate contains calcifications. Other: Negative for free fluid. Negative for free air. Musculoskeletal: Disc space narrowing at L5-S1. No acute bone abnormality. Umbilical hernia containing fat. IMPRESSION: 1. Moderate to severe distension of the distal esophagus and stomach with a transition point in the duodenum. Findings could be related to obstruction or stricture in the duodenum secondary to the peripancreatic inflammatory changes. This stomach distension is new from the previous examination. 2. Residual inflammatory changes around the pancreas compatible with pancreatitis or sequela of pancreatitis. Again noted are multiple peripancreatic collections compatible with pseudocysts. Majority of these collections have slightly decreased in size with exception of the collection near the  pancreatic tail which has minimally changed. Again noted is  gas involving the collection in the right abdomen lateral to the duodenum. 3. Abdominal venous structures remain patent although the splenic vein is small. 4. Small pleural effusions have resolved. 5. Colonic diverticulosis. 6. Umbilical hernia. Electronically Signed   By: Markus Daft M.D.   On: 11/26/2021 09:31   CT ABDOMEN PELVIS W CONTRAST  Result Date: 11/17/2021 CLINICAL DATA:  Recent hospitalization for necrotizing pancreatitis. Follow-up study. EXAM: CT ABDOMEN AND PELVIS WITH CONTRAST TECHNIQUE: Multidetector CT imaging of the abdomen and pelvis was performed using the standard protocol following bolus administration of intravenous contrast. RADIATION DOSE REDUCTION: This exam was performed according to the departmental dose-optimization program which includes automated exposure control, adjustment of the mA and/or kV according to patient size and/or use of iterative reconstruction technique. CONTRAST:  133m OMNIPAQUE IOHEXOL 300 MG/ML  SOLN COMPARISON:  11/01/2021. FINDINGS: Lower chest: Small pleural effusions associated with mild dependent lower lobe atelectasis. Atelectasis improved since the prior CT. Hepatobiliary: Liver normal in size and overall attenuation. Tiny low-attenuation lesion in the left lobe, too small to characterize, likely a cyst. No other liver masses or lesions. Normal gallbladder. Common bile duct measures 8 mm, increased when compared to the prior CT. No intrahepatic bile duct dilation. No evidence of a duct stone. Pancreas: Peripancreatic fluid surrounds the pancreas. There are interspersed areas of decreased enhancement along the tail suggesting necrosis. Inflammatory type stranding extends from peripancreatic fluid along the gastro duodenal ligament and anterior pararenal fascia. Lobulated fluid collection is seen extending from the pancreatic tail adjacent to the left anterior pararenal fascia, measuring 8.1 x 2.9 x  3.7 cm, new since the prior CT. There is a second less well-defined fluid collection just anterior and superior to the pancreatic tail, measuring approximately 5.6 x 2.7 x 2.6 cm. Small fluid collections lie adjacent to the pancreatic head. The largest of these, which lies between the duodenum and inferior margin of the liver, is ill-defined, measuring approximately 4.7 x 3.6 x 3.5 cm. Splenic vein is attenuated, but not thrombosed/occluded. Portal vein is widely patent. Superior mesenteric vein is widely patent. Spleen: Normal in size without focal abnormality. Adrenals/Urinary Tract: Adrenal glands are unremarkable. Kidneys are normal, without renal calculi, focal lesion, or hydronephrosis. Bladder is unremarkable. Stomach/Bowel: Mild inflammation along the distal gastric antrum. Stomach is otherwise unremarkable. Reactive inflammatory changes noted along the duodenum. Small bowel and colon are normal in caliber. No wall thickening or additional inflammation. Numerous colonic diverticula. Normal appendix visualized. Vascular/Lymphatic: Aortic atherosclerosis. No aneurysm. No discrete enlarged lymph nodes. Reproductive: Unremarkable. Other: Small fat containing umbilical hernia. Musculoskeletal: No fracture or acute finding.  No bone lesion. IMPRESSION: 1. Interval worsening of complicated/necrotizing pancreatitis, with an interval increase in peripancreatic fluid and inflammation and development adjacent acute necrotic collections. No evidence of venous thrombosis. 2. No other acute abnormality within the abdomen or pelvis. Electronically Signed   By: DLajean ManesM.D.   On: 11/17/2021 13:46   CT ABDOMEN LIMITED WO CONTRAST  Result Date: 11/01/2021 CLINICAL DATA:  Necrotizing pancreatitis, foci of gas in pancreatic bed and in anterior pararenal spaces bilaterally greater on RIGHT, question due to necrotizing pancreatitis versus bowel/duodenal perforation EXAM: CT ABDOMEN WITHOUT CONTRAST LIMITED TECHNIQUE:  Multidetector CT imaging of the abdomen was performed following the standard protocol without IV contrast. Patient drank water-soluble contrast for this exam. RADIATION DOSE REDUCTION: This exam was performed according to the departmental dose-optimization program which includes automated exposure control, adjustment of the mA and/or kV according to patient size and/or  use of iterative reconstruction technique. COMPARISON:  Earlier study 11/01/2021 FINDINGS: Lower chest: Bibasilar pleural effusions and atelectasis Hepatobiliary: Gallbladder and liver normal appearance Pancreas: Significantly enlarged and edematous pancreas with diffuse infiltration of peripancreatic fat planes consistent with acute pancreatitis. Few foci of gas again seen at the pancreatic head/body region. No abnormal fluid collections. No mass or definite hemorrhage. Spleen: Normal appearance Adrenals/Urinary Tract: Adrenal glands, kidneys, and proximal ureters unremarkable. Stomach/Bowel: Contrast opacifies the stomach, duodenal bulb, proximal descending duodenum, and third portion of duodenum as well as multiple jejunal loops. Bowel wall thickening of second and third portions of duodenum. No contrast extravasation is identified into the peripancreatic tissue planes or the anterior pararenal space to suggest duodenal perforation/ulcer. Diverticulosis of descending and distal transverse colon noted. Vascular/Lymphatic: Atherosclerotic calcifications aorta. No adenopathy. Other: No free intraperitoneal air.  No hernia. Musculoskeletal: Osseous structures unremarkable. IMPRESSION: No extravasation of GI contrast into pancreatic bed or anterior pararenal spaces to suggest duodenal perforation. Persistent visualization of edema and foci of gas at the pancreatic head/body and in the anterior pararenal spaces bilaterally greater on RIGHT, likely reflecting necrotizing pancreatitis. Severe pancreatitis changes again seen. Electronically Signed   By:  Lavonia Dana M.D.   On: 11/01/2021 16:38   DG Chest Portable 1 View  Result Date: 11/26/2021 CLINICAL DATA:  NG tube placement. EXAM: PORTABLE CHEST 1 VIEW COMPARISON:  Chest radiograph and chest CT 10/30/2021. CT abdomen and pelvis 11/26/2021. FINDINGS: An enteric tube courses into the abdomen with side hole overlying the gastric body and tip not imaged. The cardiomediastinal silhouette is unchanged. Lung volumes are low with bibasilar opacities. There is blunting of the costophrenic angles bilaterally without pleural effusions on today's earlier abdominal CT. No pneumothorax is identified. No acute osseous abnormality is seen. IMPRESSION: 1. Enteric tube reaching the stomach. 2. Low lung volumes with bibasilar atelectasis. Electronically Signed   By: Logan Bores M.D.   On: 11/26/2021 14:27   ECHOCARDIOGRAM COMPLETE  Result Date: 10/31/2021    ECHOCARDIOGRAM REPORT   Patient Name:   KAYDAN WONG Date of Exam: 10/31/2021 Medical Rec #:  [id number]      Height:       70.0 in Accession #:    [id number]     Weight:       273.1 lb Date of Birth:  05-May-1946     BSA:          2.383 m Patient Age:    47 years       BP:           118/62 mmHg Patient Gender: M              HR:           113 bpm. Exam Location:  [facility] Procedure: 2D Echo, Cardiac Doppler and Color Doppler Indications:    Dyspnea  History:        Patient has prior history of Echocardiogram examinations, most                 recent 06/09/2014. Arrythmias:Atrial Fibrillation and Atrial                 Flutter; Risk Factors:Hypertension, Dyslipidemia and Former                 Smoker.  Sonographer:    Wenda Low Referring Phys: [id number] Charlesetta Ivory GONFA  Sonographer Comments: Patient is morbidly obese. Image acquisition challenging due to respiratory motion. Patient is on Bi-Pap IMPRESSIONS  1. Left ventricular ejection fraction, by estimation, is 60 to 65%. The left ventricle has normal function. The left ventricle has no regional wall motion  abnormalities. There is moderate left ventricular hypertrophy. Left ventricular diastolic parameters are indeterminate.  2. Right ventricular systolic function is normal. The right ventricular size is normal. There is normal pulmonary artery systolic pressure.  3. Left atrial size was mildly dilated.  4. Prominent epicardial adipose tissue.  5. The mitral valve is normal in structure. No evidence of mitral valve regurgitation. No evidence of mitral stenosis.  6. The aortic valve is tricuspid. There is mild calcification of the aortic valve. Aortic valve regurgitation is not visualized. Aortic valve sclerosis is present, with no evidence of aortic valve stenosis.  7. The inferior vena cava is normal in size with greater than 50% respiratory variability, suggesting right atrial pressure of 3 mmHg. FINDINGS  Left Ventricle: Left ventricular ejection fraction, by estimation, is 60 to 65%. The left ventricle has normal function. The left ventricle has no regional wall motion abnormalities. The left ventricular internal cavity size was normal in size. There is  moderate left ventricular hypertrophy. Left ventricular diastolic parameters are indeterminate. Right Ventricle: The right ventricular size is normal. No increase in right ventricular wall thickness. Right ventricular systolic function is normal. There is normal pulmonary artery systolic pressure. The tricuspid regurgitant velocity is 2.36 m/s, and  with an assumed right atrial pressure of 3 mmHg, the estimated right ventricular systolic pressure is 29.5 mmHg. Left Atrium: Left atrial size was mildly dilated. Right Atrium: Right atrial size was normal in size. Pericardium: Prominent epicardial adipose tissue. There is no evidence of pericardial effusion. Mitral Valve: The mitral valve is normal in structure. No evidence of mitral valve regurgitation. No evidence of mitral valve stenosis. MV peak gradient, 5.5 mmHg. The mean mitral valve gradient is 2.0 mmHg.  Tricuspid Valve: The tricuspid valve is normal in structure. Tricuspid valve regurgitation is not demonstrated. No evidence of tricuspid stenosis. Aortic Valve: The aortic valve is tricuspid. There is mild calcification of the aortic valve. Aortic valve regurgitation is not visualized. Aortic valve sclerosis is present, with no evidence of aortic valve stenosis. Aortic valve mean gradient measures 4.5 mmHg. Aortic valve peak gradient measures 10.3 mmHg. Aortic valve area, by VTI measures 2.73 cm. Pulmonic Valve: The pulmonic valve was normal in structure. Pulmonic valve regurgitation is not visualized. No evidence of pulmonic stenosis. Aorta: The aortic root is normal in size and structure. Venous: The inferior vena cava is normal in size with greater than 50% respiratory variability, suggesting right atrial pressure of 3 mmHg. IAS/Shunts: No atrial level shunt detected by color flow Doppler.  LEFT VENTRICLE PLAX 2D LVIDd:         3.50 cm   Diastology LVIDs:         2.10 cm   LV e' medial:    15.40 cm/s LV PW:         1.20 cm   LV E/e' medial:  7.5 LV IVS:        1.50 cm   LV e' lateral:   13.30 cm/s LVOT diam:     2.10 cm   LV E/e' lateral: 8.7 LV SV:         71 LV SV Index:   30 LVOT Area:     3.46 cm  RIGHT VENTRICLE RV Basal diam:  3.55 cm RV Mid diam:    2.70 cm RV S prime:  16.43 cm/s LEFT ATRIUM             Index        RIGHT ATRIUM           Index LA diam:        4.30 cm 1.80 cm/m   RA Area:     22.00 cm LA Vol (A2C):   54.0 ml 22.66 ml/m  RA Volume:   69.50 ml  29.17 ml/m LA Vol (A4C):   69.4 ml 29.12 ml/m LA Biplane Vol: 67.5 ml 28.33 ml/m  AORTIC VALVE                     PULMONIC VALVE AV Area (Vmax):    2.72 cm      PV Vmax:       1.08 m/s AV Area (Vmean):   2.67 cm      PV Peak grad:  4.7 mmHg AV Area (VTI):     2.73 cm AV Vmax:           160.50 cm/s AV Vmean:          100.300 cm/s AV VTI:            0.260 m AV Peak Grad:      10.3 mmHg AV Mean Grad:      4.5 mmHg LVOT Vmax:          126.00 cm/s LVOT Vmean:        77.200 cm/s LVOT VTI:          0.205 m LVOT/AV VTI ratio: 0.79  AORTA Ao Root diam: 3.20 cm MITRAL VALVE                TRICUSPID VALVE MV Area (PHT): 3.17 cm     TR Peak grad:   22.3 mmHg MV Area VTI:   3.48 cm     TR Vmax:        236.00 cm/s MV Peak grad:  5.5 mmHg MV Mean grad:  2.0 mmHg     SHUNTS MV Vmax:       1.17 m/s     Systemic VTI:  0.20 m MV Vmean:      65.2 cm/s    Systemic Diam: 2.10 cm MV Decel Time: 239 msec MV E velocity: 116.00 cm/s Jenkins Rouge MD Electronically signed by Jenkins Rouge MD Signature Date/Time: 10/31/2021/10:20:51 AM    Final    CT PANCREAS ABD W/WO  Result Date: 11/01/2021 CLINICAL DATA:  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 EXAM: CT ABDOMEN WITHOUT AND WITH CONTRAST TECHNIQUE: Multidetector CT imaging of the abdomen was performed following the standard protocol before and following the bolus administration of intravenous contrast. RADIATION DOSE REDUCTION: This exam was performed according to the departmental dose-optimization program which includes automated exposure control, adjustment of the mA and/or kV according to patient size and/or use of iterative reconstruction technique. CONTRAST:  165m OMNIPAQUE IOHEXOL 300 MG/ML  SOLN COMPARISON:  Recent imaging from October 28, 2021. FINDINGS: Lower chest: Small bilateral effusions and basilar volume loss. Hepatobiliary: Hepatic steatosis with lobular hepatic contours. No focal, suspicious hepatic lesion. Hepatic arterial supply derive from the celiac axis and SMA with replaced RIGHT hepatic arterial supply arising from the SMA. Gallbladder with mild surrounding stranding felt to be secondary from pancreatic and retroperitoneal process. The portal vein or remains patent as does the splenic vein. Pancreas: Marked interstitial edematous pancreatitis. Note that the pancreatic parenchyma is poorly enhancing but does enhance, scattered areas of non  enhancement are noted. There is extensive surrounding  inflammation. No large hematoma. There is however gas that has developed in the LEFT and RIGHT retroperitoneum greatest on the RIGHT. Gas along the celiac and about the neck of the pancreas largely sparing the pancreas proper is new compared to February 5th and incompletely imaged on the recent comparison evaluation. Spleen: Normal. Adrenals/Urinary Tract: Adrenal glands are normal. Symmetric renal enhancement without hydronephrosis. Stomach/Bowel: Duodenal inflammation persists with surrounding stranding further extending into retroperitoneal fascial planes and associated with gas as described. No signs of pneumatosis. There also very small locules of gas anterior to the duodenum (image 92/11). No pneumoperitoneum. Vascular/Lymphatic: Atherosclerotic changes of the abdominal aorta. No adenopathy in the retroperitoneum. Other: Retroperitoneal gas as described above. Greatest collection of gas along the RIGHT flank posterior to the colon measuring 5.0 x 2.9 cm. Extensive inflammation with worsening extending throughout retroperitoneal fascial planes. Extensive body wall edema. Musculoskeletal: No acute bone finding. No destructive bone process. Spinal degenerative changes. IMPRESSION: 1. 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 2. Dissemination of inflammation throughout the retroperitoneum with gas greater on the RIGHT than the LEFT. Findings are suspicious for infection of peripancreatic necrosis, given rapid development aggressive or necrotizing infection is considered and should be correlated with the patient's clinical condition. 3. Given small locules of gas adjacent to the duodenum and the presence of gas in the retroperitoneum would consider follow-up imaging with enteric contrast media utilizing CT to exclude the possibility of a GI source of gas from bowel compromise particularly of the duodenum. 4. Hepatic steatosis with lobular  hepatic contours. 5. Small bilateral effusions and basilar volume loss. 6. Aortic atherosclerosis. Aortic Atherosclerosis (ICD10-I70.0). These results will be called to the ordering clinician or representative by the Radiologist Assistant, and communication documented in the PACS or [employer]. Electronically Signed   By: Zetta Bills M.D.   On: 11/01/2021 12:11   [place] EKG SITE RITE  Result Date: 11/28/2021 If Site Rite image not attached, placement could not be confirmed due to current cardiac rhythm. [2 weeks]  Assessment:   76 year old male with history of A-fib on Eliquis, hypertension, GERD, necrotizing pancreatitis.  He was discharged on February 20 after prolonged hospitalization for necrotizing pancreatitis.  Readmitted on February 24 with hematemesis in the setting of Eliquis, he underwent EGD on February 25 with evidence of hematin in gastric fundus status post suctioning, NG trauma in the antrum, nonbleeding duodenal ulcer, duodenitis with noncritical luminal narrowing involving second portion of duodenum.  CT February 25 with contrast revealed interval worsening of complicated/necrotizing pancreatitis with interval increase in peripancreatic fluid and inflammation, development of adjacent acute necrotic collections.  He was discharged on February 27 with PPI twice daily, full liquid diet.  Presented to the ED March 6 with hematemesis, CT with findings of severe distention of the distal esophagus and stomach with a transition point in the duodenum which was new from prior, residual inflammatory changes around the pancreas, majority of pseudocyst collections having decreased in size with the exception of collection near the pancreatic tail, with noted gas lateral to the duodenum.    Presenting with recurrent vomiting/hematemesis with evidence of distal esophageal and stomach distention with transition point in duodenum, likely caused by duodenal stricture/obstruction in the setting of  peripancreatic inflammatory changes.  NG tube was placed which immediately put out 1100 cc of light brown fluid on admission.  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  Hematemesis felt to possibly be related to known duodenal ulcer/gastritis/Mallory-Weiss tear.  Hemoglobin dropped from 13.4 on admission to 10.8 the following day, likely hemodilutional.  Last hemoglobin 11.5 at discharge 10 days ago.  Hemoglobin essentially stable, 10.1 today.  EGD was performed March 7 with findings of gastric tube in the esophagus, 2 cm hiatal hernia, erythematous mucosa in the antral wall of the stomach and antrum, duodenitis.  No specimens collected.  Eliquis has been on hold.  Numerous discussions regarding possible modalities for nutritional support.  Options including advancing NG tube down past the area of inflammation to provide feeding versus J-tube placement versus TPN versus another trial with liquid diet.  NG tube was clamped March 7.  No vomiting. NG tube removed yesterday.  Patient and family not interested in pursuing another trial of liquid diet.  Patient interested in PICC line placement for TPN.  Could try liquid diet later as the lumen of the duodenum does show some degree of patency.  Would consider repeat CT of the abdomen and pelvis with IV contrast in 4 to 6 weeks to determine if inflammation has decreased.  Constipation since illness. Unable to consume enough liquids at one time for Miralax. Discussed options such as enema, suppositories. Not interested yet. Trial of low dose Linzess '72mg'$  daily X 2 days. Will add stool softener for maintenance.      Plan:   Continue PPI twice daily. Trend H&H.  Monitor for overt GI bleeding. Patient received PICC line today. Order for TPA entered passed the cut off to get for today. TPA to start tomorrow.  Low dose Linzess X  2 days. Then docusate sodium '200mg'$  daily.   LOS: 3 days   Laureen Ochs. Bernarda Caffey [facility] [phone] 3/9/202310:37 AM

## 2021-11-29 NOTE — Progress Notes (Addendum)
Nutrition Follow-up ? ?DOCUMENTATION CODES:  ? ?Obesity unspecified ? ?INTERVENTION:  ?- Initiation of TPN to meet nutritional needs ? ?- Monitor magnesium, potassium, and phosphorus BID for at least 3 days, MD to replete as needed, as pt is at risk for refeeding syndrome given several weeks of poor PO intake. ? ?- Recommend repletion of low potassium levels; spoke with MD ? ?NUTRITION DIAGNOSIS:  ? ?Inadequate oral intake related to altered GI function as evidenced by per patient/family report, percent weight loss (discharged on full liquid diet). ? ?Ongoing ? ?GOAL:  ? ?Patient will meet greater than or equal to 90% of their needs ? ?Not being met- addressing with initiation of TPN ? ?MONITOR:  ? ?Labs ? ?REASON FOR ASSESSMENT:  ? ?Malnutrition Screening Tool ?  ? ?ASSESSMENT:  ? ?3/9 f/u- plans to start TPN 3/10-Patient presented on 3/6 with c/o abdominal pain, vomiting and hiccups. EGD findings of hiatal hernia, severe inflam and edema in duodenal bulb, narrow lumen d/t inflam, PICC line placed today ? ?3/6: CT scan- findings of moderate to severe distension of distal esophagus and stomach with a transition point in the duodenum and finding of duodenal stricture ad new finding of stomach distension ? ?3/7: upper EGD- 2cm hiatal hernia, diffuse severe inflammation and edema in duodenal bulb, lumen narrowed secondary to inflammation in bulb and duodenal sweep; NGT placed to suction ? ?3/8: NGT removed ? ?Extensive discussions had with pt regarding modalities of nutrition. Given recurrent admissions for inability to tolerate PO diet with nausea/vomiting, and inflammation of duodenum, PICC line placed today for initiation of TPN to allow for bowel rest. Plans to start TPN tomorrow. Spoke with pharmacy regarding recommendations. ? ?Although on clear liquid diet, pt taking no PO meals. Spoke with pt about TPN and addressed current questions. ? ? ? ?Medications: dulcolax, colace, linzess, melatonin, protonix,  miralax ? ?Labs reviewed: potassium 3.0  ? ?Diet Order:   ?Diet Order   ? ?       ?  Diet clear liquid Room service appropriate? Yes; Fluid consistency: Thin  Diet effective 0500       ?  ? ?  ?  ? ?  ? ? ?EDUCATION NEEDS:  ? ?Education needs have been addressed ? ?Skin:  Skin Assessment: Reviewed RN Assessment ? ?Last BM:  3/6 ? ?Height:  ? ?Ht Readings from Last 1 Encounters:  ?11/26/21 $RemoveBe'5\' 10"'EczFukRmn$  (1.778 m)  ? ? ?Weight:  ? ?Wt Readings from Last 1 Encounters:  ?11/26/21 108 kg  ? ? ?Ideal Body Weight:  75.5 kg ? ?BMI:  Body mass index is 34.15 kg/m?. ? ?Estimated Nutritional Needs:  ? ?Kcal:  2200-2300 ? ?Protein:  105-114 gr ? ?Fluid:  >2 liters daily ? ?Clayborne Dana, RDN, LDN ?Clinical Nutrition ?

## 2021-11-29 NOTE — TOC Progression Note (Signed)
Transition of Care (TOC) - Progression Note  ? ? ?Patient Details  ?Name: Sean Golden ?MRN: [id number] ?Date of Birth: [birth year] ? ?Transition of Care (TOC) CM/SW Contact  ?Shade Flood, LCSW ?Phone Number: ?11/29/2021, 1:29 PM ? ?Clinical Narrative:    ? ?TOC following. MD anticipating dc in 1-2 days and plan remains for TPN at home. Updated Pam at [facility] [employer] of the dc timeframe. Per Pam, they will need to know by 10AM tomorrow if plan remains for dc Friday or Saturday so that they can prepare the TPN in time. Updated MD and also requested order for the TPN. ? ?Spoke with Marjory Lies at [facility] and they do not have an RN available so they will not be able to accept pt back for [facility] at dc. TOC working on finding an alternate [employer]. Will follow. ? ?Expected Discharge Plan: [place] ?Barriers to Discharge: Continued Medical Work up ? ?Expected Discharge Plan and Services ?Expected Discharge Plan: [place] ?In-house Referral: Clinical Social Work ?Discharge Planning Services: CM Consult ?Post Acute Care Choice: Home Health ?Living arrangements for the past 2 months: [place] ?                ?  ?  ?  ?  ?  ?HH Arranged: RN, PT, OT ?[place] Agency: [facility] ?  ?  ?Representative spoke with at [facility]: Marjory Lies ? ? ?Social Determinants of Health (SDOH) Interventions ?  ? ?Readmission Risk Interventions ?Readmission Risk Prevention Plan 11/27/2021 11/19/2021 11/02/2021  ?Transportation Screening Complete Complete Complete  ?PCP or Specialist Appt within 5-7 Days - Complete -  ?Home Care Screening Complete Complete Complete  ?Medication Review (RN CM) Complete Complete Complete  ?Some recent data might be hidden  ? ? ?

## 2021-11-29 NOTE — Progress Notes (Signed)
PHARMACY - TOTAL PARENTERAL NUTRITION CONSULT NOTE  ? ?Indication:  necrotizing pancreatitis with duodenal obstruction ? ?Patient Measurements: ?Height: '5\' 10"'$  (177.8 cm) ?Weight: 108 kg (238 lb) ?IBW/kg (Calculated) : 73 ?TPN AdjBW (KG): 81.7 ?Body mass index is 34.15 kg/m?. ?Usual Weight:  ? ?Assessment:  ? ?Glucose / Insulin:  ?Electrolytes:  ?Renal:  ?Hepatic:  ?Intake / Output; MIVF:  ?GI Imaging: ?GI Surgeries / Procedures:  ? ?Central access:  ?TPN start date:  ? ?Nutritional Goals: ?Goal TPN rate is 100 mL/hr (provides 108 g of protein and 2136 kcals per day) ? ?RD Assessment: ?Estimated Needs ?Total Energy Estimated Needs: 2200-2300 ?Total Protein Estimated Needs: 105-114 gr ?Total Fluid Estimated Needs: >2 liters daily ? ?Current Nutrition:  ?NPO ? ?Plan:  ?TPN to start 3/10 due to being past cutoff time to make TPN today. ? ?Margot Ables, PharmD ?Clinical Pharmacist ?11/29/2021 12:44 PM ? ? ?

## 2021-11-29 NOTE — Progress Notes (Signed)
PROGRESS NOTE  Sean Golden YYT:[id number] DOB: 1946/02/25 DOA: 11/26/2021 PCP: Celene Squibb, MD  Brief History:  76 year old gentleman who has been dealing with a difficult situation after being hospitalized for necrotizing pancreatitis requiring a 16-day hospitalization from 10/27/2021 to 11/12/2021.  He was readmitted from 11/16/2021 to 11/19/2021 secondary to hematemesis.  He underwent EGD on 11/17/2021 with evidence of hematin in gastric fundus status post suctioning, NG trauma in the antrum, normal pylorus, and nonbleeding duodenal ulcer without stigmata of bleeding, and duodenitis with noncritical luminal narrowing involving second portion of the duodenum. He was treated with clear liquids and then advance to full liquids and discharged home on a full liquid diet.  He was supposed to advance his diet to soft after 1 week and follow-up with GI within 1 week.  He returned to the ED on 11/26/21 complaining of vomiting abdominal pain and hiccups.   His emesis was Gastroccult positive.  His rectal exam was guaiac negative.  He had findings of hypokalemia and leukocytosis.  He was sent for another CT scan 11/26/21 with findings of moderate to severe distension of the distal esophagus and stomach with a transition point in the duodenum and findings of duodenal stricture and new findings of stomach distention.  GI was consulted and recommended starting IV Protonix, IV nausea medication and IV fluids and keep n.p.o. at this time.  11/27/2021: NGT was placed on 3/6 with 1100cc dark liquid.   EGD  was performed on 11/27/21 and showed diffuse severe inflammation and edema in duodenal bulb with lumen narrowing secondary to inflammation in bulb and duodenal sweep  11/28/21--long discussion with family.  They wish to pursue PICC line and TPN in the short term.  PICC line consult ordered  11/29/21--patient feeling well without NG which was removed 11/28/21 evening.  No vomiting.  Abdominal pain is improving.  Tolerating  sips of clear liquids with meds.  PICC line placed.    Assessment and Plan: Chronic pancreatitis ([place]) - He appears to have developed chronic pancreatitis at this point - Keep n.p.o. for now -IV fluids ordered as above -IV pain management as needed -IV Protonix for GI protection  Gastric outlet obstruction - N.p.o. for now -NG placed as discussed above - IV fluids and IV Protonix twice daily -GI consultation appreciated -exacerbated by severe inflammation at duodenal bulb.  -11/27/21 EGD--diffuse severe infl and edema in duodenal bulb; lumen narrowed secondary to inflammation in bulb and duodenal sweep 3/8--long discussion with family>>they prefer PICC line and TPN -can take essential meds with sips of water 3/9--PICC line placed  Intractable nausea and vomiting - Aggressive supportive measures as ordered -improved with NG decompression -take meds with sips of water -NG removed 3/8 evening>>no further vomiting  Permanent atrial fibrillation (HCC) Holding apixaban due to bleeding Continue diltiazem CD HRs largely controlled Restart apixaban when ok with GI  Acute blood loss anemia Hgb dropped form 13.4>>10.8>>10.9 Now appears stabilized Continue PPI CBC in am  Hematemesis - Holding apixaban -IV Protonix ordered twice daily --restart apixaban when ok with GI  Leukocytosis Due to stress demargination Stable and afebrile off antibiotics  Hypokalemia - Check magnesium and replace as needed -IV replacement ordered -add KCl to IVF -Recheck in a.m.  Gastroesophageal reflux disease - IV Protonix as ordered  Sleep apnea - We will offer CPAP therapy while in hospital  Essential hypertension Controlled Continue diltiazem CD  Paroxysmal atrial fibrillation (HCC)-resolved as of 11/28/2021 -  resuming home oral diltiazem   Status is: Inpatient Remains inpatient appropriate because: severity of illness and intolerance of po       Family Communication:   spouse at  bedside 3/9   Consultants:  GI   Code Status:  FULL    DVT Prophylaxis:  apixaban on hold     Procedures: As Listed in Progress Note Above   Antibiotics: None         Subjective: Patient denies fevers, chills, headache, chest pain, dyspnea, nausea, vomiting, diarrhea, abdominal pain, dysuria, hematuria, hematochezia, and melena.   Objective: Vitals:   11/28/21 2003 11/29/21 0306 11/29/21 1502 11/29/21 1630  BP: 114/75 118/79 116/72   Pulse: 90 76 68   Resp: '18 18 20   '$ Temp: 99.2 F (37.3 C) 97.9 F (36.6 C) 98.6 F (37 C)   TempSrc: Oral Oral Oral   SpO2: 94% 97% 96%   Weight:    107.4 kg  Height:        Intake/Output Summary (Last 24 hours) at 11/29/2021 1752 Last data filed at 11/29/2021 1700 Gross per 24 hour  Intake 2164.78 ml  Output 575 ml  Net 1589.78 ml   Weight change:  Exam:  General:  Pt is alert, follows commands appropriately, not in acute distress HEENT: No icterus, No thrush, No neck mass, [place]/AT Cardiovascular: RRR, S1/S2, no rubs, no gallops Respiratory: fine bibasilar crackles. No wheeze Abdomen: Soft/+BS, non tender, non distended, no guarding Extremities: No edema, No lymphangitis, No petechiae, No rashes, no synovitis   Data Reviewed: I have personally reviewed following labs and imaging studies Basic Metabolic Panel: Recent Labs  Lab 11/26/21 0754 11/27/21 0559 11/28/21 0525 11/29/21 0541  NA 134* 139 137 135  K 3.2* 3.3* 3.3* 3.0*  CL 97* 106 106 104  CO2 '24 24 22 '$ 20*  GLUCOSE 157* 128* 124* 122*  BUN '17 17 12 10  '$ CREATININE 0.83 0.68 0.68 0.62  CALCIUM 9.3 8.3* 8.2* 8.1*  MG 2.0 2.0  --  1.8  PHOS  --   --   --  2.7   Liver Function Tests: Recent Labs  Lab 11/26/21 0754 11/27/21 0559  AST 21 15  ALT 19 14  ALKPHOS 78 65  BILITOT 0.8 0.6  PROT 7.5 6.0*  ALBUMIN 3.6 3.0*   Recent Labs  Lab 11/26/21 0754  LIPASE 36   No results for input(s): AMMONIA in the last 168 hours. Coagulation Profile: No results  for input(s): INR, PROTIME in the last 168 hours. CBC: Recent Labs  Lab 11/26/21 0754 11/27/21 0559 11/28/21 0525 11/29/21 0541  WBC 11.7* 10.7* 12.6* 11.5*  NEUTROABS 7.8*  --   --   --   HGB 13.4 10.8* 10.9* 10.1*  HCT 40.8 35.1* 33.8* 31.1*  MCV 94.4 97.0 97.7 96.3  PLT 500* 371 354 319   Cardiac Enzymes: No results for input(s): CKTOTAL, CKMB, CKMBINDEX, TROPONINI in the last 168 hours. BNP: Invalid input(s): POCBNP CBG: No results for input(s): GLUCAP in the last 168 hours. HbA1C: No results for input(s): HGBA1C in the last 72 hours. Urine analysis:    Component Value Date/Time   COLORURINE YELLOW 05/22/2021 1707   APPEARANCEUR HAZY (A) 05/22/2021 1707   LABSPEC 1.017 05/22/2021 1707   PHURINE 5.0 05/22/2021 1707   GLUCOSEU NEGATIVE 05/22/2021 1707   HGBUR NEGATIVE 05/22/2021 1707   BILIRUBINUR NEGATIVE 05/22/2021 1707   KETONESUR NEGATIVE 05/22/2021 1707   PROTEINUR NEGATIVE 05/22/2021 1707   NITRITE NEGATIVE 05/22/2021 1707  LEUKOCYTESUR NEGATIVE 05/22/2021 1707   Sepsis Labs: '@LABRCNTIP'$ (procalcitonin:4,lacticidven:4) )No results found for this or any previous visit (from the past 240 hour(s)).   Scheduled Meds:  bisacodyl  10 mg Rectal Once   brimonidine  1 drop Right Eye BID   Chlorhexidine Gluconate Cloth  6 each Topical Daily   diltiazem  240 mg Oral Daily   docusate sodium  200 mg Oral QHS   linaclotide  72 mcg Oral QAC breakfast   melatonin  6 mg Oral QHS   pantoprazole (PROTONIX) IV  40 mg Intravenous Q12H   polyethylene glycol  17 g Oral Daily   sodium chloride flush  10-40 mL Intracatheter Q12H   tamsulosin  0.8 mg Oral QPM   Continuous Infusions:  0.9 % NaCl with KCl 40 mEq / L     potassium chloride 10 mEq (11/29/21 1744)    Procedures/Studies: CT CHEST WO CONTRAST  Result Date: 10/30/2021 CLINICAL DATA:  Pneumonia, complication suspected. Respiratory distress, hypoxia, tachypnea. EXAM: CT CHEST WITHOUT CONTRAST TECHNIQUE: Multidetector  CT imaging of the chest was performed following the standard protocol without IV contrast. RADIATION DOSE REDUCTION: This exam was performed according to the departmental dose-optimization program which includes automated exposure control, adjustment of the mA and/or kV according to patient size and/or use of iterative reconstruction technique. COMPARISON:  10/28/2021 FINDINGS: Cardiovascular: Mild aortic and coronary artery calcifications. No aortic aneurysm. Normal heart size. No pericardial effusions. Mediastinum/Nodes: Thyroid gland is unremarkable. Scattered mediastinal lymph nodes are not pathologically enlarged. Esophagus is decompressed. Lungs/Pleura: New development of bilateral basilar atelectasis or consolidation. No pleural effusions. No pneumothorax. Airways are patent. Upper Abdomen: Inflammatory changes again demonstrated in the visualized pancreas although incompletely included. There is hazy stranding around the pancreatic head and body with soft tissue gas. The gas collections are new since prior study and may represent necrotizing infection or fistula. CT abdomen and pelvis suggested for further evaluation. Musculoskeletal: Degenerative changes in the spine. IMPRESSION: 1. New development of bilateral basilar consolidation or atelectasis in the lungs. 2. Incomplete visualization of changes of pancreatitis in the upper abdomen. New finding of peripancreatic gas suggesting necrotizing infection or fistula. CT abdomen and pelvis suggested for further evaluation. 3. Aortic atherosclerosis. Electronically Signed   By: Lucienne Capers M.D.   On: 10/30/2021 20:00   CT ABDOMEN PELVIS W CONTRAST  Result Date: 11/26/2021 CLINICAL DATA:  Abdominal pain, acute, nonlocalized. EXAM: CT ABDOMEN AND PELVIS WITH CONTRAST TECHNIQUE: Multidetector CT imaging of the abdomen and pelvis was performed using the standard protocol following bolus administration of intravenous contrast. RADIATION DOSE REDUCTION: This  exam was performed according to the departmental dose-optimization program which includes automated exposure control, adjustment of the mA and/or kV according to patient size and/or use of iterative reconstruction technique. CONTRAST:  2m OMNIPAQUE IOHEXOL 350 MG/ML SOLN COMPARISON:  11/17/2021 FINDINGS: Lower chest: Small pleural effusions have resolved since the previous examination. Residual densities at the right lung base are most compatible with scarring or atelectasis. Hepatobiliary: Normal appearance of the liver with mild intrahepatic biliary dilatation. Normal appearance of the gallbladder. No significant extrahepatic biliary dilatation. Pancreas: Again noted are inflammatory changes surrounding the pancreas with stranding and low-density collections. Again noted is a prominent bilobed low-density collection along the pancreatic tail that measures 8.9 x 3.4 cm on sequence 2 image 40 and roughly measured 8.8 x 3.4 cm on 11/17/2021. Decreased low-density collections along the anterior aspect of the pancreatic neck region. Low-density collection or pseudocysts just anterior to the IVC  on sequence 2 image 27 measures 3.8 x 1.8 cm and previously measured 5.0 x 2.2 cm. Low-density collection between the duodenum and right hepatic lobe on sequence 2 image 38 measures 4.3 x 2.5 cm and previously measured 4.9 x 3.5 cm. Again noted is a complex collection in the right abdomen that is contiguous with peripancreatic inflammation and contains gas. This gas containing collection roughly measures 4.8 x 2.5 cm and previously measured 6.1 x 3.3 cm. Patchy areas of enhancement in the pancreas compatible with underlying pancreatic necrosis. Spleen: Normal in size without focal abnormality. Adrenals/Urinary Tract: Normal appearance of the adrenal glands. Normal appearance of both kidneys without hydronephrosis. 0.7 cm structure along the anterior left kidney on sequence 2 image 30 is too small to characterize and  indeterminate. This probably represents a small hyperdense cyst based on a CT from 11/01/2021. Additional small low-density in left kidney lower pole probably represent a cyst. Normal appearance of the urinary bladder. Stomach/Bowel: Distal esophagus is distended with fluid. Stomach is very distended and contains a large amount of fluid. There is a transition point in the descending duodenum. Distal duodenum is decompressed. Small bowel is decompressed. Extensive diverticulosis involving the sigmoid colon and descending colon. Normal appendix. Vascular/Lymphatic: Splenic vein is small and surrounded by the pancreatic inflammation. However, there appears to be flow within the splenic vein. The portal vein confluence is patent. Main portal venous system is patent. Atherosclerotic calcifications in the abdominal aorta without aneurysm. Main visceral arteries are patent. No significant lymph node enlargement in the abdomen or pelvis. Reproductive: Prostate contains calcifications. Other: Negative for free fluid. Negative for free air. Musculoskeletal: Disc space narrowing at L5-S1. No acute bone abnormality. Umbilical hernia containing fat. IMPRESSION: 1. Moderate to severe distension of the distal esophagus and stomach with a transition point in the duodenum. Findings could be related to obstruction or stricture in the duodenum secondary to the peripancreatic inflammatory changes. This stomach distension is new from the previous examination. 2. Residual inflammatory changes around the pancreas compatible with pancreatitis or sequela of pancreatitis. Again noted are multiple peripancreatic collections compatible with pseudocysts. Majority of these collections have slightly decreased in size with exception of the collection near the pancreatic tail which has minimally changed. Again noted is gas involving the collection in the right abdomen lateral to the duodenum. 3. Abdominal venous structures remain patent although  the splenic vein is small. 4. Small pleural effusions have resolved. 5. Colonic diverticulosis. 6. Umbilical hernia. Electronically Signed   By: Markus Daft M.D.   On: 11/26/2021 09:31   CT ABDOMEN PELVIS W CONTRAST  Result Date: 11/17/2021 CLINICAL DATA:  Recent hospitalization for necrotizing pancreatitis. Follow-up study. EXAM: CT ABDOMEN AND PELVIS WITH CONTRAST TECHNIQUE: Multidetector CT imaging of the abdomen and pelvis was performed using the standard protocol following bolus administration of intravenous contrast. RADIATION DOSE REDUCTION: This exam was performed according to the departmental dose-optimization program which includes automated exposure control, adjustment of the mA and/or kV according to patient size and/or use of iterative reconstruction technique. CONTRAST:  184m OMNIPAQUE IOHEXOL 300 MG/ML  SOLN COMPARISON:  11/01/2021. FINDINGS: Lower chest: Small pleural effusions associated with mild dependent lower lobe atelectasis. Atelectasis improved since the prior CT. Hepatobiliary: Liver normal in size and overall attenuation. Tiny low-attenuation lesion in the left lobe, too small to characterize, likely a cyst. No other liver masses or lesions. Normal gallbladder. Common bile duct measures 8 mm, increased when compared to the prior CT. No intrahepatic bile duct dilation.  No evidence of a duct stone. Pancreas: Peripancreatic fluid surrounds the pancreas. There are interspersed areas of decreased enhancement along the tail suggesting necrosis. Inflammatory type stranding extends from peripancreatic fluid along the gastro duodenal ligament and anterior pararenal fascia. Lobulated fluid collection is seen extending from the pancreatic tail adjacent to the left anterior pararenal fascia, measuring 8.1 x 2.9 x 3.7 cm, new since the prior CT. There is a second less well-defined fluid collection just anterior and superior to the pancreatic tail, measuring approximately 5.6 x 2.7 x 2.6 cm. Small  fluid collections lie adjacent to the pancreatic head. The largest of these, which lies between the duodenum and inferior margin of the liver, is ill-defined, measuring approximately 4.7 x 3.6 x 3.5 cm. Splenic vein is attenuated, but not thrombosed/occluded. Portal vein is widely patent. Superior mesenteric vein is widely patent. Spleen: Normal in size without focal abnormality. Adrenals/Urinary Tract: Adrenal glands are unremarkable. Kidneys are normal, without renal calculi, focal lesion, or hydronephrosis. Bladder is unremarkable. Stomach/Bowel: Mild inflammation along the distal gastric antrum. Stomach is otherwise unremarkable. Reactive inflammatory changes noted along the duodenum. Small bowel and colon are normal in caliber. No wall thickening or additional inflammation. Numerous colonic diverticula. Normal appendix visualized. Vascular/Lymphatic: Aortic atherosclerosis. No aneurysm. No discrete enlarged lymph nodes. Reproductive: Unremarkable. Other: Small fat containing umbilical hernia. Musculoskeletal: No fracture or acute finding.  No bone lesion. IMPRESSION: 1. Interval worsening of complicated/necrotizing pancreatitis, with an interval increase in peripancreatic fluid and inflammation and development adjacent acute necrotic collections. No evidence of venous thrombosis. 2. No other acute abnormality within the abdomen or pelvis. Electronically Signed   By: Lajean Manes M.D.   On: 11/17/2021 13:46   CT ABDOMEN LIMITED WO CONTRAST  Result Date: 11/01/2021 CLINICAL DATA:  Necrotizing pancreatitis, foci of gas in pancreatic bed and in anterior pararenal spaces bilaterally greater on RIGHT, question due to necrotizing pancreatitis versus bowel/duodenal perforation EXAM: CT ABDOMEN WITHOUT CONTRAST LIMITED TECHNIQUE: Multidetector CT imaging of the abdomen was performed following the standard protocol without IV contrast. Patient drank water-soluble contrast for this exam. RADIATION DOSE REDUCTION: This  exam was performed according to the departmental dose-optimization program which includes automated exposure control, adjustment of the mA and/or kV according to patient size and/or use of iterative reconstruction technique. COMPARISON:  Earlier study 11/01/2021 FINDINGS: Lower chest: Bibasilar pleural effusions and atelectasis Hepatobiliary: Gallbladder and liver normal appearance Pancreas: Significantly enlarged and edematous pancreas with diffuse infiltration of peripancreatic fat planes consistent with acute pancreatitis. Few foci of gas again seen at the pancreatic head/body region. No abnormal fluid collections. No mass or definite hemorrhage. Spleen: Normal appearance Adrenals/Urinary Tract: Adrenal glands, kidneys, and proximal ureters unremarkable. Stomach/Bowel: Contrast opacifies the stomach, duodenal bulb, proximal descending duodenum, and third portion of duodenum as well as multiple jejunal loops. Bowel wall thickening of second and third portions of duodenum. No contrast extravasation is identified into the peripancreatic tissue planes or the anterior pararenal space to suggest duodenal perforation/ulcer. Diverticulosis of descending and distal transverse colon noted. Vascular/Lymphatic: Atherosclerotic calcifications aorta. No adenopathy. Other: No free intraperitoneal air.  No hernia. Musculoskeletal: Osseous structures unremarkable. IMPRESSION: No extravasation of GI contrast into pancreatic bed or anterior pararenal spaces to suggest duodenal perforation. Persistent visualization of edema and foci of gas at the pancreatic head/body and in the anterior pararenal spaces bilaterally greater on RIGHT, likely reflecting necrotizing pancreatitis. Severe pancreatitis changes again seen. Electronically Signed   By: Lavonia Dana M.D.   On: 11/01/2021 16:38  DG CHEST PORT 1 VIEW  Result Date: 11/29/2021 CLINICAL DATA:  PICC line placement. EXAM: PORTABLE CHEST 1 VIEW COMPARISON:  AP chest 11/26/2021  FINDINGS: Interval removal of the prior enteric tube. New right upper extremity PICC tip overlies the central superior vena cava. Cardiac silhouette and mediastinal contours are within normal limits. Mildly decreased lung volumes with mild bibasilar linear subsegmental atelectasis versus scarring. No pleural effusion or pneumothorax. No acute skeletal abnormality. IMPRESSION: New right upper extremity PICC tip overlies the central superior vena cava. Electronically Signed   By: Yvonne Kendall M.D.   On: 11/29/2021 10:42   DG Chest Portable 1 View  Result Date: 11/26/2021 CLINICAL DATA:  NG tube placement. EXAM: PORTABLE CHEST 1 VIEW COMPARISON:  Chest radiograph and chest CT 10/30/2021. CT abdomen and pelvis 11/26/2021. FINDINGS: An enteric tube courses into the abdomen with side hole overlying the gastric body and tip not imaged. The cardiomediastinal silhouette is unchanged. Lung volumes are low with bibasilar opacities. There is blunting of the costophrenic angles bilaterally without pleural effusions on today's earlier abdominal CT. No pneumothorax is identified. No acute osseous abnormality is seen. IMPRESSION: 1. Enteric tube reaching the stomach. 2. Low lung volumes with bibasilar atelectasis. Electronically Signed   By: Logan Bores M.D.   On: 11/26/2021 14:27   ECHOCARDIOGRAM COMPLETE  Result Date: 10/31/2021    ECHOCARDIOGRAM REPORT   Patient Name:   LARZ MARK Date of Exam: 10/31/2021 Medical Rec #:  [id number]      Height:       70.0 in Accession #:    [id number]     Weight:       273.1 lb Date of Birth:  June 27, 1946     BSA:          2.383 m Patient Age:    62 years       BP:           118/62 mmHg Patient Gender: M              HR:           113 bpm. Exam Location:  [facility] Procedure: 2D Echo, Cardiac Doppler and Color Doppler Indications:    Dyspnea  History:        Patient has prior history of Echocardiogram examinations, most                 recent 06/09/2014. Arrythmias:Atrial Fibrillation  and Atrial                 Flutter; Risk Factors:Hypertension, Dyslipidemia and Former                 Smoker.  Sonographer:    Wenda Low Referring Phys: [id number] Charlesetta Ivory GONFA  Sonographer Comments: Patient is morbidly obese. Image acquisition challenging due to respiratory motion. Patient is on Bi-Pap IMPRESSIONS  1. Left ventricular ejection fraction, by estimation, is 60 to 65%. The left ventricle has normal function. The left ventricle has no regional wall motion abnormalities. There is moderate left ventricular hypertrophy. Left ventricular diastolic parameters are indeterminate.  2. Right ventricular systolic function is normal. The right ventricular size is normal. There is normal pulmonary artery systolic pressure.  3. Left atrial size was mildly dilated.  4. Prominent epicardial adipose tissue.  5. The mitral valve is normal in structure. No evidence of mitral valve regurgitation. No evidence of mitral stenosis.  6. The aortic valve is tricuspid. There is mild calcification of the aortic  valve. Aortic valve regurgitation is not visualized. Aortic valve sclerosis is present, with no evidence of aortic valve stenosis.  7. The inferior vena cava is normal in size with greater than 50% respiratory variability, suggesting right atrial pressure of 3 mmHg. FINDINGS  Left Ventricle: Left ventricular ejection fraction, by estimation, is 60 to 65%. The left ventricle has normal function. The left ventricle has no regional wall motion abnormalities. The left ventricular internal cavity size was normal in size. There is  moderate left ventricular hypertrophy. Left ventricular diastolic parameters are indeterminate. Right Ventricle: The right ventricular size is normal. No increase in right ventricular wall thickness. Right ventricular systolic function is normal. There is normal pulmonary artery systolic pressure. The tricuspid regurgitant velocity is 2.36 m/s, and  with an assumed right atrial pressure of 3  mmHg, the estimated right ventricular systolic pressure is 24.2 mmHg. Left Atrium: Left atrial size was mildly dilated. Right Atrium: Right atrial size was normal in size. Pericardium: Prominent epicardial adipose tissue. There is no evidence of pericardial effusion. Mitral Valve: The mitral valve is normal in structure. No evidence of mitral valve regurgitation. No evidence of mitral valve stenosis. MV peak gradient, 5.5 mmHg. The mean mitral valve gradient is 2.0 mmHg. Tricuspid Valve: The tricuspid valve is normal in structure. Tricuspid valve regurgitation is not demonstrated. No evidence of tricuspid stenosis. Aortic Valve: The aortic valve is tricuspid. There is mild calcification of the aortic valve. Aortic valve regurgitation is not visualized. Aortic valve sclerosis is present, with no evidence of aortic valve stenosis. Aortic valve mean gradient measures 4.5 mmHg. Aortic valve peak gradient measures 10.3 mmHg. Aortic valve area, by VTI measures 2.73 cm. Pulmonic Valve: The pulmonic valve was normal in structure. Pulmonic valve regurgitation is not visualized. No evidence of pulmonic stenosis. Aorta: The aortic root is normal in size and structure. Venous: The inferior vena cava is normal in size with greater than 50% respiratory variability, suggesting right atrial pressure of 3 mmHg. IAS/Shunts: No atrial level shunt detected by color flow Doppler.  LEFT VENTRICLE PLAX 2D LVIDd:         3.50 cm   Diastology LVIDs:         2.10 cm   LV e' medial:    15.40 cm/s LV PW:         1.20 cm   LV E/e' medial:  7.5 LV IVS:        1.50 cm   LV e' lateral:   13.30 cm/s LVOT diam:     2.10 cm   LV E/e' lateral: 8.7 LV SV:         71 LV SV Index:   30 LVOT Area:     3.46 cm  RIGHT VENTRICLE RV Basal diam:  3.55 cm RV Mid diam:    2.70 cm RV S prime:     16.43 cm/s LEFT ATRIUM             Index        RIGHT ATRIUM           Index LA diam:        4.30 cm 1.80 cm/m   RA Area:     22.00 cm LA Vol (A2C):   54.0 ml 22.66  ml/m  RA Volume:   69.50 ml  29.17 ml/m LA Vol (A4C):   69.4 ml 29.12 ml/m LA Biplane Vol: 67.5 ml 28.33 ml/m  AORTIC VALVE  PULMONIC VALVE AV Area (Vmax):    2.72 cm      PV Vmax:       1.08 m/s AV Area (Vmean):   2.67 cm      PV Peak grad:  4.7 mmHg AV Area (VTI):     2.73 cm AV Vmax:           160.50 cm/s AV Vmean:          100.300 cm/s AV VTI:            0.260 m AV Peak Grad:      10.3 mmHg AV Mean Grad:      4.5 mmHg LVOT Vmax:         126.00 cm/s LVOT Vmean:        77.200 cm/s LVOT VTI:          0.205 m LVOT/AV VTI ratio: 0.79  AORTA Ao Root diam: 3.20 cm MITRAL VALVE                TRICUSPID VALVE MV Area (PHT): 3.17 cm     TR Peak grad:   22.3 mmHg MV Area VTI:   3.48 cm     TR Vmax:        236.00 cm/s MV Peak grad:  5.5 mmHg MV Mean grad:  2.0 mmHg     SHUNTS MV Vmax:       1.17 m/s     Systemic VTI:  0.20 m MV Vmean:      65.2 cm/s    Systemic Diam: 2.10 cm MV Decel Time: 239 msec MV E velocity: 116.00 cm/s Jenkins Rouge MD Electronically signed by Jenkins Rouge MD Signature Date/Time: 10/31/2021/10:20:51 AM    Final    CT PANCREAS ABD W/WO  Result Date: 11/01/2021 CLINICAL DATA:  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 EXAM: CT ABDOMEN WITHOUT AND WITH CONTRAST TECHNIQUE: Multidetector CT imaging of the abdomen was performed following the standard protocol before and following the bolus administration of intravenous contrast. RADIATION DOSE REDUCTION: This exam was performed according to the departmental dose-optimization program which includes automated exposure control, adjustment of the mA and/or kV according to patient size and/or use of iterative reconstruction technique. CONTRAST:  156m OMNIPAQUE IOHEXOL 300 MG/ML  SOLN COMPARISON:  Recent imaging from October 28, 2021. FINDINGS: Lower chest: Small bilateral effusions and basilar volume loss. Hepatobiliary: Hepatic steatosis with lobular hepatic contours. No focal, suspicious hepatic lesion. Hepatic arterial supply derive  from the celiac axis and SMA with replaced RIGHT hepatic arterial supply arising from the SMA. Gallbladder with mild surrounding stranding felt to be secondary from pancreatic and retroperitoneal process. The portal vein or remains patent as does the splenic vein. Pancreas: Marked interstitial edematous pancreatitis. Note that the pancreatic parenchyma is poorly enhancing but does enhance, scattered areas of non enhancement are noted. There is extensive surrounding inflammation. No large hematoma. There is however gas that has developed in the LEFT and RIGHT retroperitoneum greatest on the RIGHT. Gas along the celiac and about the neck of the pancreas largely sparing the pancreas proper is new compared to February 5th and incompletely imaged on the recent comparison evaluation. Spleen: Normal. Adrenals/Urinary Tract: Adrenal glands are normal. Symmetric renal enhancement without hydronephrosis. Stomach/Bowel: Duodenal inflammation persists with surrounding stranding further extending into retroperitoneal fascial planes and associated with gas as described. No signs of pneumatosis. There also very small locules of gas anterior to the duodenum (image 92/11). No pneumoperitoneum. Vascular/Lymphatic: Atherosclerotic changes of the abdominal aorta. No adenopathy  in the retroperitoneum. Other: Retroperitoneal gas as described above. Greatest collection of gas along the RIGHT flank posterior to the colon measuring 5.0 x 2.9 cm. Extensive inflammation with worsening extending throughout retroperitoneal fascial planes. Extensive body wall edema. Musculoskeletal: No acute bone finding. No destructive bone process. Spinal degenerative changes. IMPRESSION: 1. 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 2. Dissemination of inflammation throughout the retroperitoneum with gas greater on the RIGHT than the LEFT. Findings are suspicious for infection of  peripancreatic necrosis, given rapid development aggressive or necrotizing infection is considered and should be correlated with the patient's clinical condition. 3. Given small locules of gas adjacent to the duodenum and the presence of gas in the retroperitoneum would consider follow-up imaging with enteric contrast media utilizing CT to exclude the possibility of a GI source of gas from bowel compromise particularly of the duodenum. 4. Hepatic steatosis with lobular hepatic contours. 5. Small bilateral effusions and basilar volume loss. 6. Aortic atherosclerosis. Aortic Atherosclerosis (ICD10-I70.0). These results will be called to the ordering clinician or representative by the Radiologist Assistant, and communication documented in the PACS or [employer]. Electronically Signed   By: Zetta Bills M.D.   On: 11/01/2021 12:11   [place] EKG SITE RITE  Result Date: 11/28/2021 If Site Rite image not attached, placement could not be confirmed due to current cardiac rhythm.   Orson Eva, DO  [facility]  If 7PM-7AM, please contact night-coverage [email or web address] Password [email or web address] 11/29/2021, 5:52 PM   LOS: 3 days

## 2021-11-29 NOTE — Progress Notes (Signed)
Peripherally Inserted Central Catheter Placement ? ?The IV Nurse has discussed with the patient and/or persons authorized to consent for the patient, the purpose of this procedure and the potential benefits and risks involved with this procedure.  The benefits include less needle sticks, lab draws from the catheter, and the patient may be discharged home with the catheter. Risks include, but not limited to, infection, bleeding, blood clot (thrombus formation), and puncture of an artery; nerve damage and irregular heartbeat and possibility to perform a PICC exchange if needed/ordered by physician.  Alternatives to this procedure were also discussed.  Bard Power PICC patient education guide, fact sheet on infection prevention and patient information card has been provided to patient /or left at bedside.   ? ?PICC Placement Documentation  ?PICC Double Lumen 11/29/21 Right Brachial 42 cm 0 cm (Active)  ?Indication for Insertion or Continuance of Line Administration of hyperosmolar/irritating solutions (i.e. TPN, Vancomycin, etc.) 11/29/21 1012  ?Exposed Catheter (cm) 0 cm 11/29/21 1012  ?Site Assessment Clean, Dry, Intact 11/29/21 1012  ?Lumen #1 Status Flushed;Saline locked;Blood return noted 11/29/21 1012  ?Lumen #2 Status Flushed;Saline locked;Blood return noted 11/29/21 1012  ?Dressing Type Transparent;Securing device 11/29/21 1012  ?Dressing Status Antimicrobial disc in place;Clean, Dry, Intact 11/29/21 1012  ?Safety Lock Not Applicable 07/37/10 6269  ?Dressing Intervention New dressing;Other (Comment) 11/29/21 1012  ?Dressing Change Due 12/06/21 11/29/21 1012  ? ? ? ? ? ?Enos Fling ?11/29/2021, 10:14 AM ? ?

## 2021-11-30 ENCOUNTER — Encounter (HOSPITAL_COMMUNITY): Payer: Self-pay | Admitting: Gastroenterology

## 2021-11-30 DIAGNOSIS — I10 Essential (primary) hypertension: Secondary | ICD-10-CM | POA: Diagnosis not present

## 2021-11-30 DIAGNOSIS — R112 Nausea with vomiting, unspecified: Secondary | ICD-10-CM

## 2021-11-30 DIAGNOSIS — K922 Gastrointestinal hemorrhage, unspecified: Secondary | ICD-10-CM | POA: Diagnosis not present

## 2021-11-30 DIAGNOSIS — D62 Acute posthemorrhagic anemia: Secondary | ICD-10-CM | POA: Diagnosis not present

## 2021-11-30 DIAGNOSIS — K861 Other chronic pancreatitis: Secondary | ICD-10-CM | POA: Diagnosis not present

## 2021-11-30 DIAGNOSIS — K311 Adult hypertrophic pyloric stenosis: Secondary | ICD-10-CM | POA: Diagnosis not present

## 2021-11-30 LAB — MAGNESIUM: Magnesium: 2 mg/dL (ref 1.7–2.4)

## 2021-11-30 LAB — COMPREHENSIVE METABOLIC PANEL
ALT: 15 U/L (ref 0–44)
AST: 22 U/L (ref 15–41)
Albumin: 2.8 g/dL — ABNORMAL LOW (ref 3.5–5.0)
Alkaline Phosphatase: 65 U/L (ref 38–126)
Anion gap: 8 (ref 5–15)
BUN: 9 mg/dL (ref 8–23)
CO2: 20 mmol/L — ABNORMAL LOW (ref 22–32)
Calcium: 7.9 mg/dL — ABNORMAL LOW (ref 8.9–10.3)
Chloride: 106 mmol/L (ref 98–111)
Creatinine, Ser: 0.64 mg/dL (ref 0.61–1.24)
GFR, Estimated: >60 mL/min (ref >60)
Glucose, Bld: 111 mg/dL — ABNORMAL HIGH (ref 70–99)
Potassium: 3.5 mmol/L (ref 3.5–5.1)
Sodium: 134 mmol/L — ABNORMAL LOW (ref 135–145)
Total Bilirubin: 1.1 mg/dL (ref 0.3–1.2)
Total Protein: 5.8 g/dL — ABNORMAL LOW (ref 6.5–8.1)

## 2021-11-30 LAB — CBC
HCT: 29.8 % — ABNORMAL LOW (ref 39.0–52.0)
Hemoglobin: 9.7 g/dL — ABNORMAL LOW (ref 13.0–17.0)
MCH: 31.2 pg (ref 26.0–34.0)
MCHC: 32.6 g/dL (ref 30.0–36.0)
MCV: 95.8 fL (ref 80.0–100.0)
Platelets: 329 10*3/uL (ref 150–400)
RBC: 3.11 MIL/uL — ABNORMAL LOW (ref 4.22–5.81)
RDW: 13.6 % (ref 11.5–15.5)
WBC: 11.4 10*3/uL — ABNORMAL HIGH (ref 4.0–10.5)
nRBC: 0 % (ref 0.0–0.2)

## 2021-11-30 LAB — PHOSPHORUS: Phosphorus: 2.3 mg/dL — ABNORMAL LOW (ref 2.5–4.6)

## 2021-11-30 LAB — TRIGLYCERIDES: Triglycerides: 89 mg/dL (ref <150)

## 2021-11-30 MED ORDER — TRAVASOL 10 % IV SOLN
INTRAVENOUS | Status: AC
  Filled 2021-11-30: qty 540

## 2021-11-30 MED ORDER — POTASSIUM CHLORIDE IN NACL 40-0.9 MEQ/L-% IV SOLN: INTRAVENOUS | Status: AC

## 2021-11-30 MED ORDER — INSULIN ASPART 100 UNIT/ML IJ SOLN
0.0000 [IU] | Freq: Four times a day (QID) | INTRAMUSCULAR | Status: DC
  Administered 2021-12-01: 3 [IU] via SUBCUTANEOUS
  Administered 2021-12-01: 4 [IU] via SUBCUTANEOUS
  Administered 2021-12-01: 3 [IU] via SUBCUTANEOUS
  Administered 2021-12-02 (×2): 4 [IU] via SUBCUTANEOUS
  Administered 2021-12-02: 3 [IU] via SUBCUTANEOUS

## 2021-11-30 NOTE — Evaluation (Signed)
Physical Therapy Evaluation ?Patient Details ?Name: Sean Golden ?MRN: [id number] ?DOB: 01-Mar-1946 ?Today's Date: 11/30/2021 ? ?History of Present Illness ? 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  He was discharged again on 11/19/2021 after a brief admission for coffee-ground emesis.  He was seen by GI at that time and had EGD done with findings of nonbleeding duodenal ulceration.  He was treated with clear liquids and then advance to full liquids and discharged home on a full liquid diet.  He was supposed to advance his diet to soft after 1 week and follow-up with GI within 1 week.  He returned to the ED today complaining of vomiting abdominal pain and hiccups since around 2200 last night.  His emesis was Gastroccult positive.  His rectal exam was guaiac negative.  He had findings of hypokalemia and leukocytosis.  He was sent for another CT scan 11/26/21 with findings of moderate to severe distension of the distal esophagus and stomach with a transition point in the duodenum and findings of duodenal stricture and new findings of stomach distention.  GI was consulted and recommended starting IV Protonix, IV nausea medication and IV fluids and keep n.p.o. at this time ?  ?Clinical Impression ? Patient demonstrating slight weakness and balance deficit during session today but overall does not require assist for mobility. He is able to ambulate without AD and manage IV pole without loss of balance. He is limited by fatigue with ambulation and is returned to room at end of session. Patient discharged to care of nursing for ambulation daily as tolerated for length of stay. ?   ?   ? ?Recommendations for follow up therapy are one component of a multi-disciplinary discharge planning process, led by the attending physician.  Recommendations may be updated based on patient status,  additional functional criteria and insurance authorization. ? ?Follow Up Recommendations Home health PT ? ?  ?Assistance Recommended at Discharge PRN  ?Patient can return home with the following ? A little help with walking and/or transfers;A little help with bathing/dressing/bathroom;Help with stairs or ramp for entrance;Assist for transportation;Assistance with cooking/housework ? ?  ?Equipment Recommendations None recommended by PT  ?Recommendations for Other Services ?    ?  ?Functional Status Assessment Patient has had a recent decline in their functional status and demonstrates the ability to make significant improvements in function in a reasonable and predictable amount of time.  ? ?  ?Precautions / Restrictions Precautions ?Precautions: Fall ?Restrictions ?Weight Bearing Restrictions: No  ? ?  ? ?Mobility ? Bed Mobility ?Overal bed mobility: Independent ?  ?  ?  ?  ?  ?  ?  ?  ? ?Transfers ?Overall transfer level: Modified independent ?Equipment used: None ?  ?  ?  ?  ?  ?  ?  ?General transfer comment: slighlty unsteady upon standing initially ?  ? ?Ambulation/Gait ?Ambulation/Gait assistance: Modified independent (Device/Increase time) ?Gait Distance (Feet): 100 Feet ?Assistive device: IV Pole, None ?Gait Pattern/deviations: Decreased step length - right, Decreased step length - left, Decreased stride length ?Gait velocity: decreased ?  ?  ?General Gait Details: ambulates without AD with slight unsteadiness, able to ambulate with IV pole without loss of balance with decreasing cadence with fatigue; limited by fatigue ? ?Stairs ?  ?  ?  ?  ?  ? ?Wheelchair Mobility ?  ? ?Modified Rankin (Stroke Patients Only) ?  ? ?  ? ?  Balance Overall balance assessment: Needs assistance ?Sitting-balance support: Feet supported, No upper extremity supported ?Sitting balance-Leahy Scale: Good ?Sitting balance - Comments: seated at EOB ?  ?Standing balance support: During functional activity, No upper extremity  supported ?Standing balance-Leahy Scale: Fair ?Standing balance comment: fair/good ?  ?  ?  ?  ?  ?  ?  ?  ?  ?  ?  ?   ? ? ? ?Pertinent Vitals/Pain Pain Assessment ?Pain Assessment: No/denies pain  ? ? ?Home Living Family/patient expects to be discharged to:: Private residence ?Living Arrangements: Spouse/significant other ?Available Help at Discharge: Family ?Type of Home: House ?Home Access: Stairs to enter ?Entrance Stairs-Rails: Left;Right;Can reach both ?Entrance Stairs-Number of Steps: 1-2 ?Alternate Level Stairs-Number of Steps: 12 to basement ?Home Layout: Two level;Laundry or work area in basement ?Home Equipment: Wheelchair - Publishing copy (2 wheels);Cane - single point ?   ?  ?Prior Function Prior Level of Function : Independent/Modified Independent ?  ?  ?  ?  ?  ?  ?Mobility Comments: has been limited to household ambulation without AD since recent admissions ?ADLs Comments: Pt reports independence for ADL's and IADL's. Pt drives. ?  ? ? ?Hand Dominance  ?   ? ?  ?Extremity/Trunk Assessment  ? Upper Extremity Assessment ?Upper Extremity Assessment: Overall WFL for tasks assessed ?  ? ?Lower Extremity Assessment ?Lower Extremity Assessment: Generalized weakness ?  ? ?Cervical / Trunk Assessment ?Cervical / Trunk Assessment: Normal  ?Communication  ? Communication: No difficulties  ?Cognition Arousal/Alertness: Awake/alert ?Behavior During Therapy: [facility] for tasks assessed/performed ?Overall Cognitive Status: Within Functional Limits for tasks assessed ?  ?  ?  ?  ?  ?  ?  ?  ?  ?  ?  ?  ?  ?  ?  ?  ?  ?  ?  ? ?  ?General Comments   ? ?  ?Exercises    ? ?Assessment/Plan  ?  ?PT Assessment All further PT needs can be met in the next venue of care  ?PT Problem List Decreased strength;Decreased mobility;Decreased activity tolerance;Decreased balance ? ?   ?  ?PT Treatment Interventions Balance training;Gait training;Neuromuscular re-education;Patient/family education;Stair training;Therapeutic  activities;Therapeutic exercise;Functional mobility training;DME instruction   ? ?PT Goals (Current goals can be found in the Care Plan section)  ?Acute Rehab PT Goals ?PT Goal Formulation: With patient ?Time For Goal Achievement: 11/30/21 ?Potential to Achieve Goals: Good ? ?  ?Frequency   ?  ? ? ?Co-evaluation   ?  ?  ?  ?  ? ? ?  ?AM-PAC PT "6 Clicks" Mobility  ?Outcome Measure Help needed turning from your back to your side while in a flat bed without using bedrails?: None ?Help needed moving from lying on your back to sitting on the side of a flat bed without using bedrails?: None ?Help needed moving to and from a bed to a chair (including a wheelchair)?: None ?Help needed standing up from a chair using your arms (e.g., wheelchair or bedside chair)?: None ?Help needed to walk in hospital room?: A Little ?Help needed climbing 3-5 steps with a railing? : A Little ?6 Click Score: 22 ? ?  ?End of Session Equipment Utilized During Treatment: Gait belt ?Activity Tolerance: Patient tolerated treatment well;Patient limited by fatigue ?Patient left: in bed;with call bell/phone within reach ?Nurse Communication: Mobility status ?PT Visit Diagnosis: Unsteadiness on feet (R26.81);Other abnormalities of gait and mobility (R26.89);Muscle weakness (generalized) (M62.81) ?  ? ?Time: 6073-7106 ?PT Time  Calculation (min) (ACUTE ONLY): 10 min ? ? ?Charges:   PT Evaluation ?$PT Eval Low Complexity: 1 Low ?  ?  ?   ? ? ?10:26 AM, 11/30/21 ?Mearl Latin PT, DPT ?Physical Therapist at [facility] ?[facility] ? ? ?

## 2021-11-30 NOTE — Plan of Care (Signed)
  Problem: Education: Goal: Knowledge of General Education information will improve Description Including pain rating scale, medication(s)/side effects and non-pharmacologic comfort measures Outcome: Progressing   Problem: Health Behavior/Discharge Planning: Goal: Ability to manage health-related needs will improve Outcome: Progressing   Problem: Clinical Measurements: Goal: Ability to maintain clinical measurements within normal limits will improve Outcome: Progressing Goal: Will remain free from infection Outcome: Progressing   Problem: Activity: Goal: Risk for activity intolerance will decrease Outcome: Progressing   Problem: Nutrition: Goal: Adequate nutrition will be maintained Outcome: Progressing   Problem: Coping: Goal: Level of anxiety will decrease Outcome: Progressing   Problem: Elimination: Goal: Will not experience complications related to bowel motility Outcome: Progressing   Problem: Pain Managment: Goal: General experience of comfort will improve Outcome: Progressing   Problem: Safety: Goal: Ability to remain free from injury will improve Outcome: Progressing   Problem: Skin Integrity: Goal: Risk for impaired skin integrity will decrease Outcome: Progressing   

## 2021-11-30 NOTE — Progress Notes (Signed)
PHARMACY - TOTAL PARENTERAL NUTRITION CONSULT NOTE  ? ?Indication: necrotizing pancreatitis with duodenal obstruction ? ?Patient Measurements: ?Height: '5\' 10"'$  (177.8 cm) ?Weight: 107.4 kg (236 lb 12.4 oz) ?IBW/kg (Calculated) : 73 ?TPN AdjBW (KG): 81.7 ?Body mass index is 33.97 kg/m?. ?Usual Weight:  ? ?Assessment:  ? ?Glucose / Insulin: WNL ?Electrolytes: Na 134  K 3.5  Phos 2.3 ?Renal: WNL ?Hepatic: WNL ?Intake / Output; MIVF: NS w/ potassium 40 mEq/L @ 100 mL/hr ? ?Central access: PICC 3.9 ?TPN start date: 3/10 ? ?Nutritional Goals: ?Goal TPN rate is 100 mL/hr (provides 108 g of protein and 2136 kcals per day) ? ?RD Assessment: ?Estimated Needs ?Total Energy Estimated Needs: 2200-2300 ?Total Protein Estimated Needs: 105-114 gr ?Total Fluid Estimated Needs: >2 liters daily ? ?Current Nutrition:  ?NPO ? ?Plan:  ?Start TPN at 50 mL/hr at 1800 ?Electrolytes in TPN: Na 61mq/L, K 512m/L, Ca 61m61mL, Mg 61mE93m, and Phos 161mm37m. Cl:Ac 1:2 ?Add standard MVI and trace elements to TPN ?Initiate Resistant q6h SSI and adjust as needed  ?Reduce MIVF to 50 mL/hr at 1800 ?Monitor TPN labs on Mon/Thurs ? ?SteveMargot AblesrmD ?Clinical Pharmacist ?11/30/2021 8:48 AM ? ? ?

## 2021-11-30 NOTE — Progress Notes (Signed)
PT has ambulated in hall way multiple times today with wife. Home health nurse came to educated pt and family on PICC and TPN administration.  ?

## 2021-11-30 NOTE — Progress Notes (Signed)
TPN administered and verified with Yvonna Alanis, RN ?

## 2021-11-30 NOTE — Progress Notes (Signed)
PROGRESS NOTE  Sean Golden YIF:[id number] DOB: 1946-04-25 DOA: 11/26/2021 PCP: Celene Squibb, MD  Brief History:  76 year old gentleman who has been dealing with a difficult situation after being hospitalized for necrotizing pancreatitis requiring a 16-day hospitalization from 10/27/2021 to 11/12/2021.  He was readmitted from 11/16/2021 to 11/19/2021 secondary to hematemesis.  He underwent EGD on 11/17/2021 with evidence of hematin in gastric fundus status post suctioning, NG trauma in the antrum, normal pylorus, and nonbleeding duodenal ulcer without stigmata of bleeding, and duodenitis with noncritical luminal narrowing involving second portion of the duodenum. He was treated with clear liquids and then advance to full liquids and discharged home on a full liquid diet.  He was supposed to advance his diet to soft after 1 week and follow-up with GI within 1 week.  He returned to the ED on 11/26/21 complaining of vomiting abdominal pain and hiccups.   His emesis was Gastroccult positive.  His rectal exam was guaiac negative.  He had findings of hypokalemia and leukocytosis.  He was sent for another CT scan 11/26/21 with findings of moderate to severe distension of the distal esophagus and stomach with a transition point in the duodenum and findings of duodenal stricture and new findings of stomach distention.  GI was consulted and recommended starting IV Protonix, IV nausea medication and IV fluids and keep n.p.o. at this time.  11/27/2021: NGT was placed on 3/6 with 1100cc dark liquid.   EGD  was performed on 11/27/21 and showed diffuse severe inflammation and edema in duodenal bulb with lumen narrowing secondary to inflammation in bulb and duodenal sweep  11/28/21--long discussion with family.  They wish to pursue PICC line and TPN in the short term.  PICC line consult ordered  11/29/21--patient feeling well without NG which was removed 11/28/21 evening.  No vomiting.  Abdominal pain is improving.  Tolerating  sips of clear liquids with meds.  PICC line placed.  11/30/21--pt had small volume melenotic stool.  Otherwise feeling well without n/v/d, abd pain.  D/C delayed due to logistics of getting TPN set up on the weekend and getting insurance authorization.     Assessment and Plan: Chronic pancreatitis ([place]) - He appears to have developed chronic pancreatitis at this point - Keep n.p.o. for now -IV fluids ordered as above -IV pain management as needed -IV Protonix for GI protection  Gastric outlet obstruction - N.p.o. for now -NG placed as discussed above - IV fluids and IV Protonix twice daily -GI consultation appreciated -exacerbated by severe inflammation at duodenal bulb.  -11/27/21 EGD--diffuse severe infl and edema in duodenal bulb; lumen narrowed secondary to inflammation in bulb and duodenal sweep 3/8--long discussion with family>>they prefer PICC line and TPN--patient intolerant to oral or NJ tube feeding and NJ tube due to severity of duodenitis and hx of necrotizing pancreatitis 3/9--PICC line placed  Intractable nausea and vomiting - Aggressive supportive measures as ordered -improved with NG decompression -NG removed 3/8 evening>>no further vomiting  Permanent atrial fibrillation (HCC) Holding apixaban due to bleeding Continue diltiazem CD HRs largely controlled Restart apixaban when ok with GI  Acute blood loss anemia Hgb dropped form 13.4>>10.8>>10.9 Now appears stabilized after period of equilibration Continue PPI No signs of active blood loss presently  Hematemesis -Holding apixaban -IV Protonix ordered twice daily --restart apixaban when ok with GI  Leukocytosis Due to stress demargination Stable and afebrile off antibiotics Overall improved  Hypokalemia - Check magnesium--2.0 -IV replacement  ordered -add KCl to IVF -Recheck in a.m.  Gastroesophageal reflux disease - IV Protonix as ordered  Sleep apnea - We will offer CPAP therapy while in  hospital  Essential hypertension Controlled Continue diltiazem CD   Status is: Inpatient Remains inpatient appropriate because: severity of illness and intolerance of po       Family Communication:   spouse at bedside 3/10   Consultants:  GI   Code Status:  FULL    DVT Prophylaxis:  apixaban on hold     Procedures: As Listed in Progress Note Above   Antibiotics: None             Subjective: Patient denies fevers, chills, headache, chest pain, dyspnea, nausea, vomiting, diarrhea, abdominal pain, dysuria, hematuria, hematochezia, and melena.   Objective: Vitals:   11/29/21 1630 11/29/21 1950 11/30/21 0417 11/30/21 1244  BP:  133/80 108/75 118/85  Pulse:  94 80 86  Resp:  '16 16 17  '$ Temp:  98.2 F (36.8 C) 98.5 F (36.9 C) 98 F (36.7 C)  TempSrc:      SpO2:  97% 96% 97%  Weight: 107.4 kg     Height:        Intake/Output Summary (Last 24 hours) at 11/30/2021 1654 Last data filed at 11/30/2021 1300 Gross per 24 hour  Intake 3585.37 ml  Output 350 ml  Net 3235.37 ml   Weight change:  Exam:  General:  Pt is alert, follows commands appropriately, not in acute distress HEENT: No icterus, No thrush, No neck mass, /AT Cardiovascular: RRR, S1/S2, no rubs, no gallops Respiratory: fine bibasilar crackles, no wheezing, no crackles, no rhonchi Abdomen: Soft/+BS, non tender, non distended, no guarding Extremities: No edema, No lymphangitis, No petechiae, No rashes, no synovitis   Data Reviewed: I have personally reviewed following labs and imaging studies Basic Metabolic Panel: Recent Labs  Lab 11/26/21 0754 11/27/21 0559 11/28/21 0525 11/29/21 0541 11/30/21 0536  NA 134* 139 137 135 134*  K 3.2* 3.3* 3.3* 3.0* 3.5  CL 97* 106 106 104 106  CO2 '24 24 22 '$ 20* 20*  GLUCOSE 157* 128* 124* 122* 111*  BUN '17 17 12 10 9  '$ CREATININE 0.83 0.68 0.68 0.62 0.64  CALCIUM 9.3 8.3* 8.2* 8.1* 7.9*  MG 2.0 2.0  --  1.8 2.0  PHOS  --   --   --  2.7 2.3*    Liver Function Tests: Recent Labs  Lab 11/26/21 0754 11/27/21 0559 11/30/21 0536  AST '21 15 22  '$ ALT '19 14 15  '$ ALKPHOS 78 65 65  BILITOT 0.8 0.6 1.1  PROT 7.5 6.0* 5.8*  ALBUMIN 3.6 3.0* 2.8*   Recent Labs  Lab 11/26/21 0754  LIPASE 36   No results for input(s): AMMONIA in the last 168 hours. Coagulation Profile: No results for input(s): INR, PROTIME in the last 168 hours. CBC: Recent Labs  Lab 11/26/21 0754 11/27/21 0559 11/28/21 0525 11/29/21 0541 11/30/21 0536  WBC 11.7* 10.7* 12.6* 11.5* 11.4*  NEUTROABS 7.8*  --   --   --   --   HGB 13.4 10.8* 10.9* 10.1* 9.7*  HCT 40.8 35.1* 33.8* 31.1* 29.8*  MCV 94.4 97.0 97.7 96.3 95.8  PLT 500* 371 354 319 329   Cardiac Enzymes: No results for input(s): CKTOTAL, CKMB, CKMBINDEX, TROPONINI in the last 168 hours. BNP: Invalid input(s): POCBNP CBG: No results for input(s): GLUCAP in the last 168 hours. HbA1C: No results for input(s): HGBA1C in the last 72 hours.  Urine analysis:    Component Value Date/Time   COLORURINE YELLOW 05/22/2021 1707   APPEARANCEUR HAZY (A) 05/22/2021 1707   LABSPEC 1.017 05/22/2021 1707   PHURINE 5.0 05/22/2021 1707   GLUCOSEU NEGATIVE 05/22/2021 1707   HGBUR NEGATIVE 05/22/2021 1707   BILIRUBINUR NEGATIVE 05/22/2021 1707   KETONESUR NEGATIVE 05/22/2021 1707   PROTEINUR NEGATIVE 05/22/2021 1707   NITRITE NEGATIVE 05/22/2021 1707   LEUKOCYTESUR NEGATIVE 05/22/2021 1707   Sepsis Labs: '@LABRCNTIP'$ (procalcitonin:4,lacticidven:4) )No results found for this or any previous visit (from the past 240 hour(s)).   Scheduled Meds:  bisacodyl  10 mg Rectal Once   brimonidine  1 drop Right Eye BID   Chlorhexidine Gluconate Cloth  6 each Topical Daily   diltiazem  240 mg Oral Daily   docusate sodium  200 mg Oral QHS   [START ON 12/01/2021] insulin aspart  0-20 Units Subcutaneous Q6H   linaclotide  72 mcg Oral QAC breakfast   melatonin  6 mg Oral QHS   pantoprazole (PROTONIX) IV  40 mg  Intravenous Q12H   polyethylene glycol  17 g Oral Daily   sodium chloride flush  10-40 mL Intracatheter Q12H   tamsulosin  0.8 mg Oral QPM   Continuous Infusions:  0.9 % NaCl with KCl 40 mEq / L 100 mL/hr at 11/30/21 0541   0.9 % NaCl with KCl 40 mEq / L     TPN ADULT (ION)      Procedures/Studies: CT ABDOMEN PELVIS W CONTRAST  Result Date: 11/26/2021 CLINICAL DATA:  Abdominal pain, acute, nonlocalized. EXAM: CT ABDOMEN AND PELVIS WITH CONTRAST TECHNIQUE: Multidetector CT imaging of the abdomen and pelvis was performed using the standard protocol following bolus administration of intravenous contrast. RADIATION DOSE REDUCTION: This exam was performed according to the departmental dose-optimization program which includes automated exposure control, adjustment of the mA and/or kV according to patient size and/or use of iterative reconstruction technique. CONTRAST:  68m OMNIPAQUE IOHEXOL 350 MG/ML SOLN COMPARISON:  11/17/2021 FINDINGS: Lower chest: Small pleural effusions have resolved since the previous examination. Residual densities at the right lung base are most compatible with scarring or atelectasis. Hepatobiliary: Normal appearance of the liver with mild intrahepatic biliary dilatation. Normal appearance of the gallbladder. No significant extrahepatic biliary dilatation. Pancreas: Again noted are inflammatory changes surrounding the pancreas with stranding and low-density collections. Again noted is a prominent bilobed low-density collection along the pancreatic tail that measures 8.9 x 3.4 cm on sequence 2 image 40 and roughly measured 8.8 x 3.4 cm on 11/17/2021. Decreased low-density collections along the anterior aspect of the pancreatic neck region. Low-density collection or pseudocysts just anterior to the IVC on sequence 2 image 27 measures 3.8 x 1.8 cm and previously measured 5.0 x 2.2 cm. Low-density collection between the duodenum and right hepatic lobe on sequence 2 image 38 measures 4.3  x 2.5 cm and previously measured 4.9 x 3.5 cm. Again noted is a complex collection in the right abdomen that is contiguous with peripancreatic inflammation and contains gas. This gas containing collection roughly measures 4.8 x 2.5 cm and previously measured 6.1 x 3.3 cm. Patchy areas of enhancement in the pancreas compatible with underlying pancreatic necrosis. Spleen: Normal in size without focal abnormality. Adrenals/Urinary Tract: Normal appearance of the adrenal glands. Normal appearance of both kidneys without hydronephrosis. 0.7 cm structure along the anterior left kidney on sequence 2 image 30 is too small to characterize and indeterminate. This probably represents a small hyperdense cyst based on a CT from  11/01/2021. Additional small low-density in left kidney lower pole probably represent a cyst. Normal appearance of the urinary bladder. Stomach/Bowel: Distal esophagus is distended with fluid. Stomach is very distended and contains a large amount of fluid. There is a transition point in the descending duodenum. Distal duodenum is decompressed. Small bowel is decompressed. Extensive diverticulosis involving the sigmoid colon and descending colon. Normal appendix. Vascular/Lymphatic: Splenic vein is small and surrounded by the pancreatic inflammation. However, there appears to be flow within the splenic vein. The portal vein confluence is patent. Main portal venous system is patent. Atherosclerotic calcifications in the abdominal aorta without aneurysm. Main visceral arteries are patent. No significant lymph node enlargement in the abdomen or pelvis. Reproductive: Prostate contains calcifications. Other: Negative for free fluid. Negative for free air. Musculoskeletal: Disc space narrowing at L5-S1. No acute bone abnormality. Umbilical hernia containing fat. IMPRESSION: 1. Moderate to severe distension of the distal esophagus and stomach with a transition point in the duodenum. Findings could be related to  obstruction or stricture in the duodenum secondary to the peripancreatic inflammatory changes. This stomach distension is new from the previous examination. 2. Residual inflammatory changes around the pancreas compatible with pancreatitis or sequela of pancreatitis. Again noted are multiple peripancreatic collections compatible with pseudocysts. Majority of these collections have slightly decreased in size with exception of the collection near the pancreatic tail which has minimally changed. Again noted is gas involving the collection in the right abdomen lateral to the duodenum. 3. Abdominal venous structures remain patent although the splenic vein is small. 4. Small pleural effusions have resolved. 5. Colonic diverticulosis. 6. Umbilical hernia. Electronically Signed   By: Markus Daft M.D.   On: 11/26/2021 09:31   CT ABDOMEN PELVIS W CONTRAST  Result Date: 11/17/2021 CLINICAL DATA:  Recent hospitalization for necrotizing pancreatitis. Follow-up study. EXAM: CT ABDOMEN AND PELVIS WITH CONTRAST TECHNIQUE: Multidetector CT imaging of the abdomen and pelvis was performed using the standard protocol following bolus administration of intravenous contrast. RADIATION DOSE REDUCTION: This exam was performed according to the departmental dose-optimization program which includes automated exposure control, adjustment of the mA and/or kV according to patient size and/or use of iterative reconstruction technique. CONTRAST:  146m OMNIPAQUE IOHEXOL 300 MG/ML  SOLN COMPARISON:  11/01/2021. FINDINGS: Lower chest: Small pleural effusions associated with mild dependent lower lobe atelectasis. Atelectasis improved since the prior CT. Hepatobiliary: Liver normal in size and overall attenuation. Tiny low-attenuation lesion in the left lobe, too small to characterize, likely a cyst. No other liver masses or lesions. Normal gallbladder. Common bile duct measures 8 mm, increased when compared to the prior CT. No intrahepatic bile duct  dilation. No evidence of a duct stone. Pancreas: Peripancreatic fluid surrounds the pancreas. There are interspersed areas of decreased enhancement along the tail suggesting necrosis. Inflammatory type stranding extends from peripancreatic fluid along the gastro duodenal ligament and anterior pararenal fascia. Lobulated fluid collection is seen extending from the pancreatic tail adjacent to the left anterior pararenal fascia, measuring 8.1 x 2.9 x 3.7 cm, new since the prior CT. There is a second less well-defined fluid collection just anterior and superior to the pancreatic tail, measuring approximately 5.6 x 2.7 x 2.6 cm. Small fluid collections lie adjacent to the pancreatic head. The largest of these, which lies between the duodenum and inferior margin of the liver, is ill-defined, measuring approximately 4.7 x 3.6 x 3.5 cm. Splenic vein is attenuated, but not thrombosed/occluded. Portal vein is widely patent. Superior mesenteric vein is widely patent.  Spleen: Normal in size without focal abnormality. Adrenals/Urinary Tract: Adrenal glands are unremarkable. Kidneys are normal, without renal calculi, focal lesion, or hydronephrosis. Bladder is unremarkable. Stomach/Bowel: Mild inflammation along the distal gastric antrum. Stomach is otherwise unremarkable. Reactive inflammatory changes noted along the duodenum. Small bowel and colon are normal in caliber. No wall thickening or additional inflammation. Numerous colonic diverticula. Normal appendix visualized. Vascular/Lymphatic: Aortic atherosclerosis. No aneurysm. No discrete enlarged lymph nodes. Reproductive: Unremarkable. Other: Small fat containing umbilical hernia. Musculoskeletal: No fracture or acute finding.  No bone lesion. IMPRESSION: 1. Interval worsening of complicated/necrotizing pancreatitis, with an interval increase in peripancreatic fluid and inflammation and development adjacent acute necrotic collections. No evidence of venous thrombosis. 2. No  other acute abnormality within the abdomen or pelvis. Electronically Signed   By: Lajean Manes M.D.   On: 11/17/2021 13:46   CT ABDOMEN LIMITED WO CONTRAST  Result Date: 11/01/2021 CLINICAL DATA:  Necrotizing pancreatitis, foci of gas in pancreatic bed and in anterior pararenal spaces bilaterally greater on RIGHT, question due to necrotizing pancreatitis versus bowel/duodenal perforation EXAM: CT ABDOMEN WITHOUT CONTRAST LIMITED TECHNIQUE: Multidetector CT imaging of the abdomen was performed following the standard protocol without IV contrast. Patient drank water-soluble contrast for this exam. RADIATION DOSE REDUCTION: This exam was performed according to the departmental dose-optimization program which includes automated exposure control, adjustment of the mA and/or kV according to patient size and/or use of iterative reconstruction technique. COMPARISON:  Earlier study 11/01/2021 FINDINGS: Lower chest: Bibasilar pleural effusions and atelectasis Hepatobiliary: Gallbladder and liver normal appearance Pancreas: Significantly enlarged and edematous pancreas with diffuse infiltration of peripancreatic fat planes consistent with acute pancreatitis. Few foci of gas again seen at the pancreatic head/body region. No abnormal fluid collections. No mass or definite hemorrhage. Spleen: Normal appearance Adrenals/Urinary Tract: Adrenal glands, kidneys, and proximal ureters unremarkable. Stomach/Bowel: Contrast opacifies the stomach, duodenal bulb, proximal descending duodenum, and third portion of duodenum as well as multiple jejunal loops. Bowel wall thickening of second and third portions of duodenum. No contrast extravasation is identified into the peripancreatic tissue planes or the anterior pararenal space to suggest duodenal perforation/ulcer. Diverticulosis of descending and distal transverse colon noted. Vascular/Lymphatic: Atherosclerotic calcifications aorta. No adenopathy. Other: No free intraperitoneal air.   No hernia. Musculoskeletal: Osseous structures unremarkable. IMPRESSION: No extravasation of GI contrast into pancreatic bed or anterior pararenal spaces to suggest duodenal perforation. Persistent visualization of edema and foci of gas at the pancreatic head/body and in the anterior pararenal spaces bilaterally greater on RIGHT, likely reflecting necrotizing pancreatitis. Severe pancreatitis changes again seen. Electronically Signed   By: Lavonia Dana M.D.   On: 11/01/2021 16:38   DG CHEST PORT 1 VIEW  Result Date: 11/29/2021 CLINICAL DATA:  PICC line placement. EXAM: PORTABLE CHEST 1 VIEW COMPARISON:  AP chest 11/26/2021 FINDINGS: Interval removal of the prior enteric tube. New right upper extremity PICC tip overlies the central superior vena cava. Cardiac silhouette and mediastinal contours are within normal limits. Mildly decreased lung volumes with mild bibasilar linear subsegmental atelectasis versus scarring. No pleural effusion or pneumothorax. No acute skeletal abnormality. IMPRESSION: New right upper extremity PICC tip overlies the central superior vena cava. Electronically Signed   By: Yvonne Kendall M.D.   On: 11/29/2021 10:42   DG Chest Portable 1 View  Result Date: 11/26/2021 CLINICAL DATA:  NG tube placement. EXAM: PORTABLE CHEST 1 VIEW COMPARISON:  Chest radiograph and chest CT 10/30/2021. CT abdomen and pelvis 11/26/2021. FINDINGS: An enteric tube courses into the  abdomen with side hole overlying the gastric body and tip not imaged. The cardiomediastinal silhouette is unchanged. Lung volumes are low with bibasilar opacities. There is blunting of the costophrenic angles bilaterally without pleural effusions on today's earlier abdominal CT. No pneumothorax is identified. No acute osseous abnormality is seen. IMPRESSION: 1. Enteric tube reaching the stomach. 2. Low lung volumes with bibasilar atelectasis. Electronically Signed   By: Logan Bores M.D.   On: 11/26/2021 14:27   CT PANCREAS ABD  W/WO  Result Date: 11/01/2021 CLINICAL DATA:  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 EXAM: CT ABDOMEN WITHOUT AND WITH CONTRAST TECHNIQUE: Multidetector CT imaging of the abdomen was performed following the standard protocol before and following the bolus administration of intravenous contrast. RADIATION DOSE REDUCTION: This exam was performed according to the departmental dose-optimization program which includes automated exposure control, adjustment of the mA and/or kV according to patient size and/or use of iterative reconstruction technique. CONTRAST:  135m OMNIPAQUE IOHEXOL 300 MG/ML  SOLN COMPARISON:  Recent imaging from October 28, 2021. FINDINGS: Lower chest: Small bilateral effusions and basilar volume loss. Hepatobiliary: Hepatic steatosis with lobular hepatic contours. No focal, suspicious hepatic lesion. Hepatic arterial supply derive from the celiac axis and SMA with replaced RIGHT hepatic arterial supply arising from the SMA. Gallbladder with mild surrounding stranding felt to be secondary from pancreatic and retroperitoneal process. The portal vein or remains patent as does the splenic vein. Pancreas: Marked interstitial edematous pancreatitis. Note that the pancreatic parenchyma is poorly enhancing but does enhance, scattered areas of non enhancement are noted. There is extensive surrounding inflammation. No large hematoma. There is however gas that has developed in the LEFT and RIGHT retroperitoneum greatest on the RIGHT. Gas along the celiac and about the neck of the pancreas largely sparing the pancreas proper is new compared to February 5th and incompletely imaged on the recent comparison evaluation. Spleen: Normal. Adrenals/Urinary Tract: Adrenal glands are normal. Symmetric renal enhancement without hydronephrosis. Stomach/Bowel: Duodenal inflammation persists with surrounding stranding further extending into retroperitoneal fascial planes and associated with gas as described. No signs of  pneumatosis. There also very small locules of gas anterior to the duodenum (image 92/11). No pneumoperitoneum. Vascular/Lymphatic: Atherosclerotic changes of the abdominal aorta. No adenopathy in the retroperitoneum. Other: Retroperitoneal gas as described above. Greatest collection of gas along the RIGHT flank posterior to the colon measuring 5.0 x 2.9 cm. Extensive inflammation with worsening extending throughout retroperitoneal fascial planes. Extensive body wall edema. Musculoskeletal: No acute bone finding. No destructive bone process. Spinal degenerative changes. IMPRESSION: 1. 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 2. Dissemination of inflammation throughout the retroperitoneum with gas greater on the RIGHT than the LEFT. Findings are suspicious for infection of peripancreatic necrosis, given rapid development aggressive or necrotizing infection is considered and should be correlated with the patient's clinical condition. 3. Given small locules of gas adjacent to the duodenum and the presence of gas in the retroperitoneum would consider follow-up imaging with enteric contrast media utilizing CT to exclude the possibility of a GI source of gas from bowel compromise particularly of the duodenum. 4. Hepatic steatosis with lobular hepatic contours. 5. Small bilateral effusions and basilar volume loss. 6. Aortic atherosclerosis. Aortic Atherosclerosis (ICD10-I70.0). These results will be called to the ordering clinician or representative by the Radiologist Assistant, and communication documented in the PACS or [employer] Electronically Signed   By: GZetta BillsM.D.   On: 11/01/2021 12:11   [place] SITE  RITE  Result Date: 11/28/2021 If Site Rite image not attached, placement could not be confirmed due to current cardiac rhythm.   Orson Eva, DO  [facility]  If 7PM-7AM, please contact  night-coverage [email or web address] Password [email or web address] 11/30/2021, 4:54 PM   LOS: 4 days

## 2021-11-30 NOTE — Care Management Important Message (Signed)
Important Message ? ?Patient Details  ?Name: Sean Golden ?MRN: [id number] ?Date of Birth: 31-Oct-1945 ? ? ?Medicare Important Message Given:  Yes ? ? ? ? ?Tommy Medal ?11/30/2021, 3:44 PM ?

## 2021-11-30 NOTE — Progress Notes (Addendum)
?Subjective: ?Had dose of linzess last night, had one looser BM this morning, states it was moderate in volume. Did not take the Keizer today in light of diarrhea. No associated abdominal pain. No nausea or vomiting. No rectal bleeding, BM was black and tarry. He continues to have intermittent hiccups today. Is passing gas. ? ?Objective: ?Vital signs in last 24 hours: ?Temp:  [98.2 ?F (36.8 ?C)-98.6 ?F (37 ?C)] 98.5 ?F (36.9 ?C) (03/10 0417) ?Pulse Rate:  [68-94] 80 (03/10 0417) ?Resp:  [16-20] 16 (03/10 0417) ?BP: (108-133)/(72-80) 108/75 (03/10 0417) ?SpO2:  [96 %-97 %] 96 % (03/10 0417) ?Weight:  [107.4 kg] 107.4 kg (03/09 1630) ?Last BM Date : 11/26/21 ?General:   Alert and oriented, pleasant ?Head:  Normocephalic and atraumatic. ?Eyes:  No icterus, sclera clear. Conjuctiva pink.  ?Mouth:  Without lesions, mucosa pink and moist.  ?Heart:  S1, S2 present, no murmurs noted.  ?Lungs: Clear to auscultation bilaterally, without wheezing, rales, or rhonchi.  ?Abdomen:  Bowel sounds present, soft, non-tender, non-distended. No HSM or hernias noted. No rebound or guarding. No masses appreciated  ?Msk:  Symmetrical without gross deformities. Normal posture. ?Pulses:  Normal pulses noted. ?Extremities:  Without clubbing or edema. ?Neurologic:  Alert and  oriented x4;  grossly normal neurologically. ?Skin:  Warm and dry, intact without significant lesions.  ?Psych:  Alert and cooperative. Normal mood and affect. ? ?Intake/Output from previous day: ?03/09 0701 - 03/10 0700 ?In: 3255.4 [P.O.:390; I.V.:2815.4; IV Piggyback:50] ?Out: 425 [Urine:425] ?Intake/Output this shift: ?No intake/output data recorded. ? ?Lab Results: ?Recent Labs  ?  11/28/21 ?7035 11/29/21 ?0093 11/30/21 ?8182  ?WBC 12.6* 11.5* 11.4*  ?HGB 10.9* 10.1* 9.7*  ?HCT 33.8* 31.1* 29.8*  ?PLT 354 319 329  ? ?BMET ?Recent Labs  ?  11/28/21 ?9937 11/29/21 ?1696 11/30/21 ?7893  ?NA 137 135 134*  ?K 3.3* 3.0* 3.5  ?CL 106 104 106  ?CO2 22 20* 20*  ?GLUCOSE 124*  122* 111*  ?BUN '12 10 9  '$ ?CREATININE 0.68 0.62 0.64  ?CALCIUM 8.2* 8.1* 7.9*  ? ?LFT ?Recent Labs  ?  11/30/21 ?8101  ?PROT 5.8*  ?ALBUMIN 2.8*  ?AST 22  ?ALT 15  ?ALKPHOS 65  ?BILITOT 1.1  ? ?Assessment: ?76 year old male with history of A-fib on Eliquis, hypertension, GERD, necrotizing pancreatitis.  He was discharged on February 20 after prolonged hospitalization for necrotizing pancreatitis.  Readmitted on February 24 with hematemesis in the setting of Eliquis, he underwent EGD on February 25 with evidence of hematin in gastric fundus status post suctioning, NG trauma in the antrum, nonbleeding duodenal ulcer, duodenitis with noncritical luminal narrowing involving second portion of duodenum.  CT February 25 with contrast revealed interval worsening of complicated/necrotizing pancreatitis with interval increase in peripancreatic fluid and inflammation, development of adjacent acute necrotic collections.  He was discharged on February 27 with PPI twice daily, full liquid diet.  Presented to the ED March 6 with hematemesis, CT with findings of severe distention of the distal esophagus and stomach with a transition point in the duodenum which was new from prior, residual inflammatory changes around the pancreas, majority of pseudocyst collections having decreased in size with the exception of collection near the pancreatic tail, with noted gas lateral to the duodenum.  ? ?Recurrent vomiting/hematemesis: with evidence of distal esophageal and stomach distention with transition point in duodenum, likely from duodenal stricture/obstruction in setting of peripancreatic inflammatory changes. Case discussed previously with advanced endoscopist in [place] who did not feel that pancreatic tail  cyst was etiology of duodenal obstruction, cystogastrostomy not recommended. Hematemesis likely secondary to duodenal ulcer/gastritis/Mallory Weiss tear. ? ?Numerous discussions had with patient and family regarding possible  modalities for nutritional support. Ultimately PICC line placement with TPN administration was what was decided upon by patient and family, as Pt did not tolerate NJ tube or liquid diet. He will require supplemental nutrition via PICC line and TPN until duodenal inflammation has improved. Will need repeat CT A/P for evaluation of inflammation in 4-6 weeks as well as repeat EGD and will require Oral trials to determine if supplemental nutrition is still needed.  ? ?Anemia: Hgb continues to trend down this admission. 9.8 today. 11.5 at last d/c 11 days ago. EGD on 3/7 with 2cm hiatal hernia, erythematous mucosa in antral wall of stomach and antrum, duodenitis, eliquis continues to be on hold. No rectal bleeding but reports black tarry stools this morning. ? ?Constipation: ongoing. Multiple options previously discussed with patient, he was not amenable to enema, suppositories. Trial of Linzess 5mg started yesterday, to be given x2 days, docusate sodium '200mg'$  daily thereafter. Had a moderate BM last night with very small amount of stool and large amount of flatulence this morning. States stool was black and tarry. Denies BRBPR ? ? ?Plan: ?Continue PPI BID ?Trend H&H, monitor for overt GI bleeding ?TPN to start today, will require this until duodenal inflammation has improved enough that PO intake can be sole nutritional source ?Linzess 72 mcg x2 doses then docusate sodium '200mg'$  daily ?Repeat CT A/P in 4-6 weeks ? ? LOS: 4 days  ? ? 11/30/2021, 9:31 AM ? ? ? L. CAlver Sorrow MSN, APRN, AGNP-C ?Adult-Gerontology Nurse Practitioner ?[facility] ? ?

## 2021-11-30 NOTE — TOC Progression Note (Signed)
Transition of Care (TOC) - Progression Note  ? ? ?Patient Details  ?Name: Sean Golden ?MRN: [id number] ?Date of Birth: 1945-12-24 ? ?Transition of Care (TOC) CM/SW Contact  ?Shade Flood, LCSW ?Phone Number: ?11/30/2021, 4:47 PM ? ?Clinical Narrative:    ? ?TOC following all day for dc planning. At this time, the plan is for dc home Sunday AM with plan for [facility] RN to see pt at home early afternoon for initiation of TPN at home. Have discussed plan with attending, GI, [facility], and [facility]. Pt and his wife are in agreement with plan. ? ?Weekend TOC will be available to assist with any final dc needs. ? ?Expected Discharge Plan: [place] ?Barriers to Discharge: Continued Medical Work up ? ?Expected Discharge Plan and Services ?Expected Discharge Plan: [place] ?In-house Referral: Clinical Social Work ?Discharge Planning Services: CM Consult ?Post Acute Care Choice: Home Health ?Living arrangements for the past 2 months: [place] ?                ?  ?  ?  ?  ?  ?HH Arranged: RN ?[place] Agency: [place] ?Date HH Agency Contacted: 11/30/21 ?  ?Representative spoke with at [place]: Tommi Rumps ? ? ?Social Determinants of Health (SDOH) Interventions ?  ? ?Readmission Risk Interventions ?Readmission Risk Prevention Plan 11/27/2021 11/19/2021 11/02/2021  ?Transportation Screening Complete Complete Complete  ?PCP or Specialist Appt within 5-7 Days - Complete -  ?Home Care Screening Complete Complete Complete  ?Medication Review (RN CM) Complete Complete Complete  ?Some recent data might be hidden  ? ? ?

## 2021-12-01 DIAGNOSIS — I10 Essential (primary) hypertension: Secondary | ICD-10-CM | POA: Diagnosis not present

## 2021-12-01 DIAGNOSIS — K922 Gastrointestinal hemorrhage, unspecified: Secondary | ICD-10-CM | POA: Diagnosis not present

## 2021-12-01 DIAGNOSIS — K861 Other chronic pancreatitis: Secondary | ICD-10-CM | POA: Diagnosis not present

## 2021-12-01 DIAGNOSIS — D62 Acute posthemorrhagic anemia: Secondary | ICD-10-CM | POA: Diagnosis not present

## 2021-12-01 DIAGNOSIS — E876 Hypokalemia: Secondary | ICD-10-CM

## 2021-12-01 DIAGNOSIS — E66811 Obesity, class 1: Secondary | ICD-10-CM

## 2021-12-01 DIAGNOSIS — E669 Obesity, unspecified: Secondary | ICD-10-CM

## 2021-12-01 DIAGNOSIS — R7302 Impaired glucose tolerance (oral): Secondary | ICD-10-CM

## 2021-12-01 DIAGNOSIS — K920 Hematemesis: Secondary | ICD-10-CM | POA: Diagnosis not present

## 2021-12-01 DIAGNOSIS — K311 Adult hypertrophic pyloric stenosis: Secondary | ICD-10-CM | POA: Diagnosis not present

## 2021-12-01 LAB — COMPREHENSIVE METABOLIC PANEL
ALT: 17 U/L (ref 0–44)
AST: 20 U/L (ref 15–41)
Albumin: 2.7 g/dL — ABNORMAL LOW (ref 3.5–5.0)
Alkaline Phosphatase: 65 U/L (ref 38–126)
Anion gap: 7 (ref 5–15)
BUN: 8 mg/dL (ref 8–23)
CO2: 21 mmol/L — ABNORMAL LOW (ref 22–32)
Calcium: 8 mg/dL — ABNORMAL LOW (ref 8.9–10.3)
Chloride: 105 mmol/L (ref 98–111)
Creatinine, Ser: 0.58 mg/dL — ABNORMAL LOW (ref 0.61–1.24)
GFR, Estimated: >60 mL/min (ref >60)
Glucose, Bld: 136 mg/dL — ABNORMAL HIGH (ref 70–99)
Potassium: 3.4 mmol/L — ABNORMAL LOW (ref 3.5–5.1)
Sodium: 133 mmol/L — ABNORMAL LOW (ref 135–145)
Total Bilirubin: 1 mg/dL (ref 0.3–1.2)
Total Protein: 5.9 g/dL — ABNORMAL LOW (ref 6.5–8.1)

## 2021-12-01 LAB — HEMOGLOBIN A1C
Hgb A1c MFr Bld: 6.2 % — ABNORMAL HIGH (ref 4.8–5.6)
Mean Plasma Glucose: 131 mg/dL

## 2021-12-01 LAB — GLUCOSE, CAPILLARY
Glucose-Capillary: 126 mg/dL — ABNORMAL HIGH (ref 70–99)
Glucose-Capillary: 139 mg/dL — ABNORMAL HIGH (ref 70–99)
Glucose-Capillary: 141 mg/dL — ABNORMAL HIGH (ref 70–99)
Glucose-Capillary: 142 mg/dL — ABNORMAL HIGH (ref 70–99)
Glucose-Capillary: 156 mg/dL — ABNORMAL HIGH (ref 70–99)

## 2021-12-01 LAB — MAGNESIUM: Magnesium: 1.7 mg/dL (ref 1.7–2.4)

## 2021-12-01 LAB — PHOSPHORUS: Phosphorus: 2.5 mg/dL (ref 2.5–4.6)

## 2021-12-01 MED ORDER — TRAVASOL 10 % IV SOLN
INTRAVENOUS | Status: DC
  Filled 2021-12-01: qty 1080

## 2021-12-01 MED ORDER — POTASSIUM CHLORIDE 10 MEQ/100ML IV SOLN
10.0000 meq | INTRAVENOUS | Status: AC
  Administered 2021-12-01 (×2): 10 meq via INTRAVENOUS
  Filled 2021-12-01 (×2): qty 100

## 2021-12-01 MED ORDER — BLOOD GLUCOSE MONITOR KIT: PACK | 0 refills | Status: DC

## 2021-12-01 MED ORDER — INSULIN ASPART 100 UNIT/ML FLEXPEN: PEN_INJECTOR | SUBCUTANEOUS | 1 refills | Status: DC

## 2021-12-01 MED ORDER — APIXABAN 5 MG PO TABS
5.0000 mg | ORAL_TABLET | Freq: Two times a day (BID) | ORAL | Status: DC
  Administered 2021-12-01 – 2021-12-02 (×2): 5 mg via ORAL
  Filled 2021-12-01 (×2): qty 1

## 2021-12-01 MED ORDER — MAGNESIUM SULFATE IN D5W 1-5 GM/100ML-% IV SOLN
1.0000 g | Freq: Once | INTRAVENOUS | Status: AC
  Administered 2021-12-01: 1 g via INTRAVENOUS
  Filled 2021-12-01 (×2): qty 100

## 2021-12-01 MED ORDER — INSULIN PEN NEEDLE 31G X 4 MM MISC: 1 refills | Status: DC

## 2021-12-01 MED ORDER — INSULIN PEN NEEDLE 31G X 4 MM MISC
1 refills | Status: DC
Start: 2021-12-01 — End: 2021-12-01

## 2021-12-01 NOTE — Assessment & Plan Note (Signed)
BMI 33.97 ?Lifestyle modification ?

## 2021-12-01 NOTE — TOC Progression Note (Signed)
Transition of Care (TOC) - Progression Note  ? ? ?Patient Details  ?Name: Sean Golden ?MRN: [id number] ?Date of Birth: [birth year] ? ?Transition of Care (TOC) CM/SW Contact  ?Kerin Salen, RN ?Phone Number: ?12/01/2021, 2:11 PM ? ?Clinical Narrative:  TOCRN spoke with Pam from Infusion and Tommi Rumps from [place] they are both ready for Infusion discharge on tomorrow. [facility] will deliver TPN bag to his room around 12noon, he will need to take the bag home with him and they will be ready to connect him when he gets home. I will be working tomorrow if for some reason he does not discharge please let me know.  ? ? ? ?Expected Discharge Plan: [place] ?Barriers to Discharge: Continued Medical Work up ? ?Expected Discharge Plan and Services ?Expected Discharge Plan: [place] ?In-house Referral: Clinical Social Work ?Discharge Planning Services: CM Consult ?Post Acute Care Choice: Home Health ?Living arrangements for the past 2 months: [place] ?                ?  ?  ?  ?  ?  ?HH Arranged: RN ?[place] Agency: [place] ?Date HH Agency Contacted: 11/30/21 ?  ?Representative spoke with at [place]: Tommi Rumps ? ? ?Social Determinants of Health (SDOH) Interventions ?  ? ?Readmission Risk Interventions ?Readmission Risk Prevention Plan 11/27/2021 11/19/2021 11/02/2021  ?Transportation Screening Complete Complete Complete  ?PCP or Specialist Appt within 5-7 Days - Complete -  ?Home Care Screening Complete Complete Complete  ?Medication Review (RN CM) Complete Complete Complete  ?Some recent data might be hidden  ? ? ?

## 2021-12-01 NOTE — Progress Notes (Signed)
PROGRESS NOTE  Sean Golden IRW:[id number] DOB: 09/25/1945 DOA: 11/26/2021 PCP: Celene Squibb, MD  Brief History:  76 year old gentleman who has been dealing with a difficult situation after being hospitalized for necrotizing pancreatitis requiring a 16-day hospitalization from 10/27/2021 to 11/12/2021.  He was readmitted from 11/16/2021 to 11/19/2021 secondary to hematemesis.  He underwent EGD on 11/17/2021 with evidence of hematin in gastric fundus status post suctioning, NG trauma in the antrum, normal pylorus, and nonbleeding duodenal ulcer without stigmata of bleeding, and duodenitis with noncritical luminal narrowing involving second portion of the duodenum. He was treated with clear liquids and then advance to full liquids and discharged home on a full liquid diet.  He was supposed to advance his diet to soft after 1 week and follow-up with GI within 1 week.  He returned to the ED on 11/26/21 complaining of vomiting abdominal pain and hiccups.   His emesis was Gastroccult positive.  His rectal exam was guaiac negative.  He had findings of hypokalemia and leukocytosis.  He was sent for another CT scan 11/26/21 with findings of moderate to severe distension of the distal esophagus and stomach with a transition point in the duodenum and findings of duodenal stricture and new findings of stomach distention.  GI was consulted and recommended starting IV Protonix, IV nausea medication and IV fluids and keep n.p.o. at this time.  11/27/2021: NGT was placed on 3/6 with 1100cc dark liquid.   EGD  was performed on 11/27/21 and showed diffuse severe inflammation and edema in duodenal bulb with lumen narrowing secondary to inflammation in bulb and duodenal sweep  11/28/21--long discussion with family.  They wish to pursue PICC line and TPN in the short term.  PICC line consult ordered  11/29/21--patient feeling well without NG which was removed 11/28/21 evening.  No vomiting.  Abdominal pain is improving.  Tolerating  sips of clear liquids with meds.  PICC line placed.  11/30/21--pt had small volume melenotic stool.  Otherwise feeling well without n/v/d, abd pain.  D/C delayed due to logistics of getting TPN set up on the weekend and getting insurance authorization.  12/01/21--educated patient about checking CBGs and sliding scale insulin while on TPN.  Clinically doing well.  Plan d/c 12/02/21 if all insurance issues and logistics are set up for TPN at home    Assessment and Plan: Chronic pancreatitis ([place]) - had NG initially -IV fluids ordered as above -IV pain management as needed -IV Protonix for GI protection --intolerant of po and enteral feeding --plan d/c with TPN  Gastric outlet obstruction - N.p.o. initially -NG placed as discussed above - IV fluids and IV Protonix twice daily -GI consultation appreciated -exacerbated by severe inflammation at duodenal bulb resulting in duodenal stricture -11/27/21 EGD--diffuse severe infl and edema in duodenal bulb; lumen narrowed secondary to inflammation in bulb and duodenal sweep 3/8--long discussion with family>>they prefer PICC line and TPN--patient intolerant to oral or NJ tube feeding and NJ tube due to severity of duodenitis and hx of necrotizing pancreatitis 3/9--PICC line placed 11/30/21--TPN started  Intractable nausea and vomiting - Aggressive supportive measures as ordered -improved with NG decompression -NG removed 3/8 evening>>no further vomiting -now overall improved, tolerating sips with meds  Impaired glucose tolerance 11/30/21 A1C--6.2 --sliding scale insulin while on TPN --I've educated patient and family about accu check and sliding scale insulin  Permanent atrial fibrillation (HCC) Holding apixaban due to bleeding Continue diltiazem CD HRs largely controlled  Restart apixaban when ok with GI>>restart 12/01/21  Acute blood loss anemia Hgb dropped form 13.4>>10.8>>10.9 Now appears stabilized after period of  equilibration Continue PPI No signs of active blood loss presently Restart apixaban 12/01/21  Hematemesis -Holding apixaban -IV Protonix ordered twice daily --restart apixaban when ok with GI  Leukocytosis Due to stress demargination Stable and afebrile off antibiotics Overall improved  Hypokalemia - Check magnesium--2.0 -IV replacement ordered -add KCl to IVF -Recheck in a.m.\ --now on TPN  Gastroesophageal reflux disease - IV Protonix as ordered  Sleep apnea offered CPAP therapy while in hospital  Essential hypertension Controlled Continue diltiazem CD   Status is: Inpatient Remains inpatient appropriate because: severity of illness and intolerance of po requiring initiation of TPN       Family Communication:   spouse at bedside 3/11   Consultants:  GI   Code Status:  FULL    DVT Prophylaxis:  apixaban      Procedures: As Listed in Progress Note Above   Antibiotics: None          Subjective: Patient denies fevers, chills, headache, chest pain, dyspnea, nausea, vomiting, diarrhea, abdominal pain, dysuria, hematuria, hematochezia, and melena.   Objective: Vitals:   11/30/21 1244 11/30/21 2053 12/01/21 0512 12/01/21 1011  BP: 118/85 137/74 121/72 114/73  Pulse: 86 97 92   Resp: '17 17 17   '$ Temp: 98 F (36.7 C) 98.5 F (36.9 C) 98.7 F (37.1 C)   TempSrc:  Oral Oral   SpO2: 97% 98% 97%   Weight:      Height:        Intake/Output Summary (Last 24 hours) at 12/01/2021 1402 Last data filed at 12/01/2021 0900 Gross per 24 hour  Intake 1025.67 ml  Output 650 ml  Net 375.67 ml   Weight change:  Exam:  General:  Pt is alert, follows commands appropriately, not in acute distress HEENT: No icterus, No thrush, No neck mass, [place]/AT Cardiovascular: IRRR, S1/S2, no rubs, no gallops Respiratory: CTA bilaterally, no wheezing, no crackles, no rhonchi Abdomen: Soft/+BS, non tender, non distended, no guarding Extremities: No edema, No  lymphangitis, No petechiae, No rashes, no synovitis   Data Reviewed: I have personally reviewed following labs and imaging studies Basic Metabolic Panel: Recent Labs  Lab 11/26/21 0754 11/27/21 0559 11/28/21 0525 11/29/21 0541 11/30/21 0536 12/01/21 0546  NA 134* 139 137 135 134* 133*  K 3.2* 3.3* 3.3* 3.0* 3.5 3.4*  CL 97* 106 106 104 106 105  CO2 '24 24 22 '$ 20* 20* 21*  GLUCOSE 157* 128* 124* 122* 111* 136*  BUN '17 17 12 10 9 8  '$ CREATININE 0.83 0.68 0.68 0.62 0.64 0.58*  CALCIUM 9.3 8.3* 8.2* 8.1* 7.9* 8.0*  MG 2.0 2.0  --  1.8 2.0 1.7  PHOS  --   --   --  2.7 2.3* 2.5   Liver Function Tests: Recent Labs  Lab 11/26/21 0754 11/27/21 0559 11/30/21 0536 12/01/21 0546  AST '21 15 22 20  '$ ALT '19 14 15 17  '$ ALKPHOS 78 65 65 65  BILITOT 0.8 0.6 1.1 1.0  PROT 7.5 6.0* 5.8* 5.9*  ALBUMIN 3.6 3.0* 2.8* 2.7*   Recent Labs  Lab 11/26/21 0754  LIPASE 36   No results for input(s): AMMONIA in the last 168 hours. Coagulation Profile: No results for input(s): INR, PROTIME in the last 168 hours. CBC: Recent Labs  Lab 11/26/21 0754 11/27/21 0559 11/28/21 0525 11/29/21 0541 11/30/21 0536  WBC 11.7* 10.7* 12.6*  11.5* 11.4*  NEUTROABS 7.8*  --   --   --   --   HGB 13.4 10.8* 10.9* 10.1* 9.7*  HCT 40.8 35.1* 33.8* 31.1* 29.8*  MCV 94.4 97.0 97.7 96.3 95.8  PLT 500* 371 354 319 329   Cardiac Enzymes: No results for input(s): CKTOTAL, CKMB, CKMBINDEX, TROPONINI in the last 168 hours. BNP: Invalid input(s): POCBNP CBG: Recent Labs  Lab 12/01/21 0038 12/01/21 0551 12/01/21 1122  GLUCAP 141* 126* 156*   HbA1C: Recent Labs    11/30/21 0536  HGBA1C 6.2*   Urine analysis:    Component Value Date/Time   COLORURINE YELLOW 05/22/2021 1707   APPEARANCEUR HAZY (A) 05/22/2021 1707   LABSPEC 1.017 05/22/2021 1707   PHURINE 5.0 05/22/2021 1707   GLUCOSEU NEGATIVE 05/22/2021 1707   HGBUR NEGATIVE 05/22/2021 1707   BILIRUBINUR NEGATIVE 05/22/2021 1707   KETONESUR  NEGATIVE 05/22/2021 1707   PROTEINUR NEGATIVE 05/22/2021 1707   NITRITE NEGATIVE 05/22/2021 1707   LEUKOCYTESUR NEGATIVE 05/22/2021 1707   Sepsis Labs: '@LABRCNTIP'$ (procalcitonin:4,lacticidven:4) )No results found for this or any previous visit (from the past 240 hour(s)).   Scheduled Meds:  apixaban  5 mg Oral BID   bisacodyl  10 mg Rectal Once   brimonidine  1 drop Right Eye BID   Chlorhexidine Gluconate Cloth  6 each Topical Daily   diltiazem  240 mg Oral Daily   docusate sodium  200 mg Oral QHS   insulin aspart  0-20 Units Subcutaneous Q6H   melatonin  6 mg Oral QHS   pantoprazole (PROTONIX) IV  40 mg Intravenous Q12H   polyethylene glycol  17 g Oral Daily   sodium chloride flush  10-40 mL Intracatheter Q12H   tamsulosin  0.8 mg Oral QPM   Continuous Infusions:  0.9 % NaCl with KCl 40 mEq / L 50 mL/hr at 12/01/21 0209   TPN ADULT (ION) 50 mL/hr at 12/01/21 0210   TPN ADULT (ION)      Procedures/Studies: CT ABDOMEN PELVIS W CONTRAST  Result Date: 11/26/2021 CLINICAL DATA:  Abdominal pain, acute, nonlocalized. EXAM: CT ABDOMEN AND PELVIS WITH CONTRAST TECHNIQUE: Multidetector CT imaging of the abdomen and pelvis was performed using the standard protocol following bolus administration of intravenous contrast. RADIATION DOSE REDUCTION: This exam was performed according to the departmental dose-optimization program which includes automated exposure control, adjustment of the mA and/or kV according to patient size and/or use of iterative reconstruction technique. CONTRAST:  32m OMNIPAQUE IOHEXOL 350 MG/ML SOLN COMPARISON:  11/17/2021 FINDINGS: Lower chest: Small pleural effusions have resolved since the previous examination. Residual densities at the right lung base are most compatible with scarring or atelectasis. Hepatobiliary: Normal appearance of the liver with mild intrahepatic biliary dilatation. Normal appearance of the gallbladder. No significant extrahepatic biliary dilatation.  Pancreas: Again noted are inflammatory changes surrounding the pancreas with stranding and low-density collections. Again noted is a prominent bilobed low-density collection along the pancreatic tail that measures 8.9 x 3.4 cm on sequence 2 image 40 and roughly measured 8.8 x 3.4 cm on 11/17/2021. Decreased low-density collections along the anterior aspect of the pancreatic neck region. Low-density collection or pseudocysts just anterior to the IVC on sequence 2 image 27 measures 3.8 x 1.8 cm and previously measured 5.0 x 2.2 cm. Low-density collection between the duodenum and right hepatic lobe on sequence 2 image 38 measures 4.3 x 2.5 cm and previously measured 4.9 x 3.5 cm. Again noted is a complex collection in the right abdomen that is  contiguous with peripancreatic inflammation and contains gas. This gas containing collection roughly measures 4.8 x 2.5 cm and previously measured 6.1 x 3.3 cm. Patchy areas of enhancement in the pancreas compatible with underlying pancreatic necrosis. Spleen: Normal in size without focal abnormality. Adrenals/Urinary Tract: Normal appearance of the adrenal glands. Normal appearance of both kidneys without hydronephrosis. 0.7 cm structure along the anterior left kidney on sequence 2 image 30 is too small to characterize and indeterminate. This probably represents a small hyperdense cyst based on a CT from 11/01/2021. Additional small low-density in left kidney lower pole probably represent a cyst. Normal appearance of the urinary bladder. Stomach/Bowel: Distal esophagus is distended with fluid. Stomach is very distended and contains a large amount of fluid. There is a transition point in the descending duodenum. Distal duodenum is decompressed. Small bowel is decompressed. Extensive diverticulosis involving the sigmoid colon and descending colon. Normal appendix. Vascular/Lymphatic: Splenic vein is small and surrounded by the pancreatic inflammation. However, there appears to be  flow within the splenic vein. The portal vein confluence is patent. Main portal venous system is patent. Atherosclerotic calcifications in the abdominal aorta without aneurysm. Main visceral arteries are patent. No significant lymph node enlargement in the abdomen or pelvis. Reproductive: Prostate contains calcifications. Other: Negative for free fluid. Negative for free air. Musculoskeletal: Disc space narrowing at L5-S1. No acute bone abnormality. Umbilical hernia containing fat. IMPRESSION: 1. Moderate to severe distension of the distal esophagus and stomach with a transition point in the duodenum. Findings could be related to obstruction or stricture in the duodenum secondary to the peripancreatic inflammatory changes. This stomach distension is new from the previous examination. 2. Residual inflammatory changes around the pancreas compatible with pancreatitis or sequela of pancreatitis. Again noted are multiple peripancreatic collections compatible with pseudocysts. Majority of these collections have slightly decreased in size with exception of the collection near the pancreatic tail which has minimally changed. Again noted is gas involving the collection in the right abdomen lateral to the duodenum. 3. Abdominal venous structures remain patent although the splenic vein is small. 4. Small pleural effusions have resolved. 5. Colonic diverticulosis. 6. Umbilical hernia. Electronically Signed   By: Markus Daft M.D.   On: 11/26/2021 09:31   CT ABDOMEN PELVIS W CONTRAST  Result Date: 11/17/2021 CLINICAL DATA:  Recent hospitalization for necrotizing pancreatitis. Follow-up study. EXAM: CT ABDOMEN AND PELVIS WITH CONTRAST TECHNIQUE: Multidetector CT imaging of the abdomen and pelvis was performed using the standard protocol following bolus administration of intravenous contrast. RADIATION DOSE REDUCTION: This exam was performed according to the departmental dose-optimization program which includes automated  exposure control, adjustment of the mA and/or kV according to patient size and/or use of iterative reconstruction technique. CONTRAST:  132m OMNIPAQUE IOHEXOL 300 MG/ML  SOLN COMPARISON:  11/01/2021. FINDINGS: Lower chest: Small pleural effusions associated with mild dependent lower lobe atelectasis. Atelectasis improved since the prior CT. Hepatobiliary: Liver normal in size and overall attenuation. Tiny low-attenuation lesion in the left lobe, too small to characterize, likely a cyst. No other liver masses or lesions. Normal gallbladder. Common bile duct measures 8 mm, increased when compared to the prior CT. No intrahepatic bile duct dilation. No evidence of a duct stone. Pancreas: Peripancreatic fluid surrounds the pancreas. There are interspersed areas of decreased enhancement along the tail suggesting necrosis. Inflammatory type stranding extends from peripancreatic fluid along the gastro duodenal ligament and anterior pararenal fascia. Lobulated fluid collection is seen extending from the pancreatic tail adjacent to the left anterior  pararenal fascia, measuring 8.1 x 2.9 x 3.7 cm, new since the prior CT. There is a second less well-defined fluid collection just anterior and superior to the pancreatic tail, measuring approximately 5.6 x 2.7 x 2.6 cm. Small fluid collections lie adjacent to the pancreatic head. The largest of these, which lies between the duodenum and inferior margin of the liver, is ill-defined, measuring approximately 4.7 x 3.6 x 3.5 cm. Splenic vein is attenuated, but not thrombosed/occluded. Portal vein is widely patent. Superior mesenteric vein is widely patent. Spleen: Normal in size without focal abnormality. Adrenals/Urinary Tract: Adrenal glands are unremarkable. Kidneys are normal, without renal calculi, focal lesion, or hydronephrosis. Bladder is unremarkable. Stomach/Bowel: Mild inflammation along the distal gastric antrum. Stomach is otherwise unremarkable. Reactive inflammatory  changes noted along the duodenum. Small bowel and colon are normal in caliber. No wall thickening or additional inflammation. Numerous colonic diverticula. Normal appendix visualized. Vascular/Lymphatic: Aortic atherosclerosis. No aneurysm. No discrete enlarged lymph nodes. Reproductive: Unremarkable. Other: Small fat containing umbilical hernia. Musculoskeletal: No fracture or acute finding.  No bone lesion. IMPRESSION: 1. Interval worsening of complicated/necrotizing pancreatitis, with an interval increase in peripancreatic fluid and inflammation and development adjacent acute necrotic collections. No evidence of venous thrombosis. 2. No other acute abnormality within the abdomen or pelvis. Electronically Signed   By: Lajean Manes M.D.   On: 11/17/2021 13:46   CT ABDOMEN LIMITED WO CONTRAST  Result Date: 11/01/2021 CLINICAL DATA:  Necrotizing pancreatitis, foci of gas in pancreatic bed and in anterior pararenal spaces bilaterally greater on RIGHT, question due to necrotizing pancreatitis versus bowel/duodenal perforation EXAM: CT ABDOMEN WITHOUT CONTRAST LIMITED TECHNIQUE: Multidetector CT imaging of the abdomen was performed following the standard protocol without IV contrast. Patient drank water-soluble contrast for this exam. RADIATION DOSE REDUCTION: This exam was performed according to the departmental dose-optimization program which includes automated exposure control, adjustment of the mA and/or kV according to patient size and/or use of iterative reconstruction technique. COMPARISON:  Earlier study 11/01/2021 FINDINGS: Lower chest: Bibasilar pleural effusions and atelectasis Hepatobiliary: Gallbladder and liver normal appearance Pancreas: Significantly enlarged and edematous pancreas with diffuse infiltration of peripancreatic fat planes consistent with acute pancreatitis. Few foci of gas again seen at the pancreatic head/body region. No abnormal fluid collections. No mass or definite hemorrhage.  Spleen: Normal appearance Adrenals/Urinary Tract: Adrenal glands, kidneys, and proximal ureters unremarkable. Stomach/Bowel: Contrast opacifies the stomach, duodenal bulb, proximal descending duodenum, and third portion of duodenum as well as multiple jejunal loops. Bowel wall thickening of second and third portions of duodenum. No contrast extravasation is identified into the peripancreatic tissue planes or the anterior pararenal space to suggest duodenal perforation/ulcer. Diverticulosis of descending and distal transverse colon noted. Vascular/Lymphatic: Atherosclerotic calcifications aorta. No adenopathy. Other: No free intraperitoneal air.  No hernia. Musculoskeletal: Osseous structures unremarkable. IMPRESSION: No extravasation of GI contrast into pancreatic bed or anterior pararenal spaces to suggest duodenal perforation. Persistent visualization of edema and foci of gas at the pancreatic head/body and in the anterior pararenal spaces bilaterally greater on RIGHT, likely reflecting necrotizing pancreatitis. Severe pancreatitis changes again seen. Electronically Signed   By: Lavonia Dana M.D.   On: 11/01/2021 16:38   DG CHEST PORT 1 VIEW  Result Date: 11/29/2021 CLINICAL DATA:  PICC line placement. EXAM: PORTABLE CHEST 1 VIEW COMPARISON:  AP chest 11/26/2021 FINDINGS: Interval removal of the prior enteric tube. New right upper extremity PICC tip overlies the central superior vena cava. Cardiac silhouette and mediastinal contours are within normal limits.  Mildly decreased lung volumes with mild bibasilar linear subsegmental atelectasis versus scarring. No pleural effusion or pneumothorax. No acute skeletal abnormality. IMPRESSION: New right upper extremity PICC tip overlies the central superior vena cava. Electronically Signed   By: Yvonne Kendall M.D.   On: 11/29/2021 10:42   DG Chest Portable 1 View  Result Date: 11/26/2021 CLINICAL DATA:  NG tube placement. EXAM: PORTABLE CHEST 1 VIEW COMPARISON:  Chest  radiograph and chest CT 10/30/2021. CT abdomen and pelvis 11/26/2021. FINDINGS: An enteric tube courses into the abdomen with side hole overlying the gastric body and tip not imaged. The cardiomediastinal silhouette is unchanged. Lung volumes are low with bibasilar opacities. There is blunting of the costophrenic angles bilaterally without pleural effusions on today's earlier abdominal CT. No pneumothorax is identified. No acute osseous abnormality is seen. IMPRESSION: 1. Enteric tube reaching the stomach. 2. Low lung volumes with bibasilar atelectasis. Electronically Signed   By: Logan Bores M.D.   On: 11/26/2021 14:27   [place] EKG SITE RITE  Result Date: 11/28/2021 If Site Rite image not attached, placement could not be confirmed due to current cardiac rhythm.   Orson Eva, DO  [facility]  If 7PM-7AM, please contact night-coverage [email or web address] Password [email or web address] 12/01/2021, 2:02 PM   LOS: 5 days

## 2021-12-01 NOTE — Discharge Summary (Signed)
Physician Discharge Summary   Patient: Sean Golden MRN: [id number] DOB: Feb 18, 1946  Admit date:     11/26/2021  Discharge date: 12/02/2021  Discharge Physician: Shanon Brow    PCP: Celene Squibb, MD   Recommendations at discharge:   Please follow up with primary care provider within 1-2 weeks  Please repeat CMP, mag, phos and CBC in one week      Hospital Course: 76 year old gentleman who has been dealing with a difficult situation after being hospitalized for necrotizing pancreatitis requiring a 16-day hospitalization from 10/27/2021 to 11/12/2021.  He was readmitted from 11/16/2021 to 11/19/2021 secondary to hematemesis.  He underwent EGD on 11/17/2021 with evidence of hematin in gastric fundus status post suctioning, NG trauma in the antrum, normal pylorus, and nonbleeding duodenal ulcer without stigmata of bleeding, and duodenitis with noncritical luminal narrowing involving second portion of the duodenum. He was treated with clear liquids and then advance to full liquids and discharged home on a full liquid diet.  He was supposed to advance his diet to soft after 1 week and follow-up with GI within 1 week.  He returned to the ED on 11/26/21 complaining of vomiting abdominal pain and hiccups.   His emesis was Gastroccult positive.  His rectal exam was guaiac negative.  He had findings of hypokalemia and leukocytosis.  He was sent for another CT scan 11/26/21 with findings of moderate to severe distension of the distal esophagus and stomach with a transition point in the duodenum and findings of duodenal stricture and new findings of stomach distention.  GI was consulted and recommended starting IV Protonix, IV nausea medication and IV fluids and keep n.p.o. at this time.  11/27/2021: NGT was placed on 3/6 with 1100cc dark liquid.   EGD  was performed on 11/27/21 and showed diffuse severe inflammation and edema in duodenal bulb with lumen narrowing secondary to inflammation in bulb and duodenal  sweep  11/28/21--long discussion with family.  They wish to pursue PICC line and TPN in the short term.  PICC line consult ordered  11/29/21--patient feeling well without NG which was removed 11/28/21 evening.  No vomiting.  Abdominal pain is improving.  Tolerating sips of clear liquids with meds.  PICC line placed.  11/30/21--pt had small volume melenotic stool.  Otherwise feeling well without n/v/d, abd pain.  D/C delayed due to logistics of getting TPN set up on the weekend and getting insurance authorization.  12/01/21--educated patient about checking CBGs and sliding scale insulin while on TPN.  Clinically doing well.  Plan d/c 12/02/21 if all insurance issues and logistics are set up for TPN at home  11/21/21--no new complains;  BMP stable;  Hgb stable.  HH and TPN have been arranged.  Ready for d/c  Assessment and Plan: Chronic pancreatitis ([place]) - had NG initially -IV fluids ordered as above -IV pain management as needed -IV Protonix for GI protection --intolerant of po and enteral feeding --plan d/c with TPN -no further abd pain at time of d/c  Gastric outlet obstruction - N.p.o. initially -NG placed as discussed above - IV fluids and IV Protonix twice daily -GI consultation appreciated -exacerbated by severe inflammation at duodenal bulb resulting in duodenal stricture -11/27/21 EGD--diffuse severe infl and edema in duodenal bulb; lumen narrowed secondary to inflammation in bulb and duodenal sweep 3/8--long discussion with family>>they prefer PICC line and TPN--patient intolerant to oral or NJ tube feeding and NJ tube due to severity of duodenitis and hx of necrotizing pancreatitis 3/9--PICC line placed  11/30/21--TPN started  Intractable nausea and vomiting - Aggressive supportive measures as ordered -improved with NG decompression -NG removed 3/8 evening>>no further vomiting -now overall improved, tolerating sips with meds  Class 1 obesity BMI 33.97 Lifestyle  modification  Impaired glucose tolerance 11/30/21 A1C--6.2 --sliding scale insulin while on TPN --I've educated patient and family about accu check and sliding scale insulin --nursing staff also assisted in educating patient regarding performing accu-checks and injecting insulin  Permanent atrial fibrillation ([place]) Holding apixaban due to bleeding Continue diltiazem CD HRs largely controlled Restart apixaban when ok with GI>>restart 12/01/21  Acute blood loss anemia Hgb dropped form 13.4>>10.8>>10.9>>10.1>>9.7>>9.6 Now appears stabilized after period of equilibration Continue PPI No signs of active blood loss presently Restart apixaban 12/01/21 Hgb stable 72+ hours prior to d/c without signs of active bleed  Hematemesis -Holding apixaban -IV Protonix ordered twice daily>>po --restart apixaban when ok with GI No further hematemesis  Leukocytosis Due to stress demargination Stable and afebrile off antibiotics Overall improved  Hypokalemia - Check magnesium--2.0 -IV replacement ordered -add KCl to IVF -Recheck in a.m.\ --now on TPN  Gastroesophageal reflux disease - IV Protonix>>po at time of d/c  Sleep apnea offered CPAP therapy while in hospital  Essential hypertension Controlled Continue diltiazem CD         Consultants: GI Procedures performed: PICC line 11/29/21  Disposition: Home Diet recommendation:  TPN;  may have clear liquids for pleasure DISCHARGE MEDICATION: Allergies as of 12/02/2021   No Known Allergies      Medication List     TAKE these medications    acetaminophen 325 MG tablet Commonly known as: TYLENOL Take 2 tablets (650 mg total) by mouth every 6 (six) hours as needed for mild pain, fever or headache (or Fever >/= 101).   blood glucose meter kit and supplies Kit Dispense based on patient and insurance preference. Use up to four times daily as directed.   brimonidine 0.2 % ophthalmic solution Commonly known as: ALPHAGAN Place  1 drop into the right eye 2 (two) times daily.   diltiazem 240 MG 24 hr capsule Commonly known as: CARDIZEM CD Take 1 capsule (240 mg total) by mouth daily.   Eliquis 5 MG Tabs tablet Generic drug: apixaban TAKE ONE TABLET (5MG TOTAL) BY MOUTH TWOTIMES DAILY What changed: See the new instructions.   feeding supplement Liqd Take 237 mLs by mouth 3 (three) times daily between meals.   furosemide 40 MG tablet Commonly known as: Lasix Take 1 tablet (40 mg total) by mouth every other day.   insulin aspart 100 UNIT/ML FlexPen Commonly known as: NOVOLOG Sugar 121-150--2 units; 151-200--3 units; 201-250--5 units; 251-300--8 units; 301-350--11units; 351-400--15 units Dispense every 6 hours   Insulin Pen Needle 31G X 4 MM Misc Use with insulin pen to dispense insulin as directed   pantoprazole 40 MG tablet Commonly known as: PROTONIX Take 1 tablet (40 mg total) by mouth 2 (two) times daily.   polyethylene glycol 17 g packet Commonly known as: MIRALAX / GLYCOLAX Take 17 g by mouth daily.   potassium chloride 10 MEQ tablet Commonly known as: KLOR-CON M Take 1 tablet (10 mEq total) by mouth every other day.   tamsulosin 0.4 MG Caps capsule Commonly known as: FLOMAX Take 2 capsules (0.8 mg total) by mouth every evening.        Follow-up Information     [facility] Follow up.   Specialty: Home Health Services Why: [facility] staff will call you to  arrange nursing visits Contact information: [address] [place] [address] [phone]                Discharge Exam: Danley Danker Weights   11/26/21 0744 11/29/21 1630  Weight: 108 kg 107.4 kg   HEENT:  [place]/AT, No thrush, no icterus CV:  RRR, no rub, no S3, no S4 Lung:  CTA, no wheeze, no rhonchi Abd:  soft/+BS, NT Ext:  No edema, no lymphangitis, no synovitis, no rash   Condition at discharge: stable  The results of significant diagnostics from this hospitalization (including  imaging, microbiology, ancillary and laboratory) are listed below for reference.   Imaging Studies: CT ABDOMEN PELVIS W CONTRAST  Result Date: 11/26/2021 CLINICAL DATA:  Abdominal pain, acute, nonlocalized. EXAM: CT ABDOMEN AND PELVIS WITH CONTRAST TECHNIQUE: Multidetector CT imaging of the abdomen and pelvis was performed using the standard protocol following bolus administration of intravenous contrast. RADIATION DOSE REDUCTION: This exam was performed according to the departmental dose-optimization program which includes automated exposure control, adjustment of the mA and/or kV according to patient size and/or use of iterative reconstruction technique. CONTRAST:  109m OMNIPAQUE IOHEXOL 350 MG/ML SOLN COMPARISON:  11/17/2021 FINDINGS: Lower chest: Small pleural effusions have resolved since the previous examination. Residual densities at the right lung base are most compatible with scarring or atelectasis. Hepatobiliary: Normal appearance of the liver with mild intrahepatic biliary dilatation. Normal appearance of the gallbladder. No significant extrahepatic biliary dilatation. Pancreas: Again noted are inflammatory changes surrounding the pancreas with stranding and low-density collections. Again noted is a prominent bilobed low-density collection along the pancreatic tail that measures 8.9 x 3.4 cm on sequence 2 image 40 and roughly measured 8.8 x 3.4 cm on 11/17/2021. Decreased low-density collections along the anterior aspect of the pancreatic neck region. Low-density collection or pseudocysts just anterior to the IVC on sequence 2 image 27 measures 3.8 x 1.8 cm and previously measured 5.0 x 2.2 cm. Low-density collection between the duodenum and right hepatic lobe on sequence 2 image 38 measures 4.3 x 2.5 cm and previously measured 4.9 x 3.5 cm. Again noted is a complex collection in the right abdomen that is contiguous with peripancreatic inflammation and contains gas. This gas containing collection  roughly measures 4.8 x 2.5 cm and previously measured 6.1 x 3.3 cm. Patchy areas of enhancement in the pancreas compatible with underlying pancreatic necrosis. Spleen: Normal in size without focal abnormality. Adrenals/Urinary Tract: Normal appearance of the adrenal glands. Normal appearance of both kidneys without hydronephrosis. 0.7 cm structure along the anterior left kidney on sequence 2 image 30 is too small to characterize and indeterminate. This probably represents a small hyperdense cyst based on a CT from 11/01/2021. Additional small low-density in left kidney lower pole probably represent a cyst. Normal appearance of the urinary bladder. Stomach/Bowel: Distal esophagus is distended with fluid. Stomach is very distended and contains a large amount of fluid. There is a transition point in the descending duodenum. Distal duodenum is decompressed. Small bowel is decompressed. Extensive diverticulosis involving the sigmoid colon and descending colon. Normal appendix. Vascular/Lymphatic: Splenic vein is small and surrounded by the pancreatic inflammation. However, there appears to be flow within the splenic vein. The portal vein confluence is patent. Main portal venous system is patent. Atherosclerotic calcifications in the abdominal aorta without aneurysm. Main visceral arteries are patent. No significant lymph node enlargement in the abdomen or pelvis. Reproductive: Prostate contains calcifications. Other: Negative for free fluid. Negative for free air. Musculoskeletal: Disc  space narrowing at L5-S1. No acute bone abnormality. Umbilical hernia containing fat. IMPRESSION: 1. Moderate to severe distension of the distal esophagus and stomach with a transition point in the duodenum. Findings could be related to obstruction or stricture in the duodenum secondary to the peripancreatic inflammatory changes. This stomach distension is new from the previous examination. 2. Residual inflammatory changes around the  pancreas compatible with pancreatitis or sequela of pancreatitis. Again noted are multiple peripancreatic collections compatible with pseudocysts. Majority of these collections have slightly decreased in size with exception of the collection near the pancreatic tail which has minimally changed. Again noted is gas involving the collection in the right abdomen lateral to the duodenum. 3. Abdominal venous structures remain patent although the splenic vein is small. 4. Small pleural effusions have resolved. 5. Colonic diverticulosis. 6. Umbilical hernia. Electronically Signed   By: Markus Daft M.D.   On: 11/26/2021 09:31   CT ABDOMEN PELVIS W CONTRAST  Result Date: 11/17/2021 CLINICAL DATA:  Recent hospitalization for necrotizing pancreatitis. Follow-up study. EXAM: CT ABDOMEN AND PELVIS WITH CONTRAST TECHNIQUE: Multidetector CT imaging of the abdomen and pelvis was performed using the standard protocol following bolus administration of intravenous contrast. RADIATION DOSE REDUCTION: This exam was performed according to the departmental dose-optimization program which includes automated exposure control, adjustment of the mA and/or kV according to patient size and/or use of iterative reconstruction technique. CONTRAST:  1105m OMNIPAQUE IOHEXOL 300 MG/ML  SOLN COMPARISON:  11/01/2021. FINDINGS: Lower chest: Small pleural effusions associated with mild dependent lower lobe atelectasis. Atelectasis improved since the prior CT. Hepatobiliary: Liver normal in size and overall attenuation. Tiny low-attenuation lesion in the left lobe, too small to characterize, likely a cyst. No other liver masses or lesions. Normal gallbladder. Common bile duct measures 8 mm, increased when compared to the prior CT. No intrahepatic bile duct dilation. No evidence of a duct stone. Pancreas: Peripancreatic fluid surrounds the pancreas. There are interspersed areas of decreased enhancement along the tail suggesting necrosis. Inflammatory  type stranding extends from peripancreatic fluid along the gastro duodenal ligament and anterior pararenal fascia. Lobulated fluid collection is seen extending from the pancreatic tail adjacent to the left anterior pararenal fascia, measuring 8.1 x 2.9 x 3.7 cm, new since the prior CT. There is a second less well-defined fluid collection just anterior and superior to the pancreatic tail, measuring approximately 5.6 x 2.7 x 2.6 cm. Small fluid collections lie adjacent to the pancreatic head. The largest of these, which lies between the duodenum and inferior margin of the liver, is ill-defined, measuring approximately 4.7 x 3.6 x 3.5 cm. Splenic vein is attenuated, but not thrombosed/occluded. Portal vein is widely patent. Superior mesenteric vein is widely patent. Spleen: Normal in size without focal abnormality. Adrenals/Urinary Tract: Adrenal glands are unremarkable. Kidneys are normal, without renal calculi, focal lesion, or hydronephrosis. Bladder is unremarkable. Stomach/Bowel: Mild inflammation along the distal gastric antrum. Stomach is otherwise unremarkable. Reactive inflammatory changes noted along the duodenum. Small bowel and colon are normal in caliber. No wall thickening or additional inflammation. Numerous colonic diverticula. Normal appendix visualized. Vascular/Lymphatic: Aortic atherosclerosis. No aneurysm. No discrete enlarged lymph nodes. Reproductive: Unremarkable. Other: Small fat containing umbilical hernia. Musculoskeletal: No fracture or acute finding.  No bone lesion. IMPRESSION: 1. Interval worsening of complicated/necrotizing pancreatitis, with an interval increase in peripancreatic fluid and inflammation and development adjacent acute necrotic collections. No evidence of venous thrombosis. 2. No other acute abnormality within the abdomen or pelvis. Electronically Signed   By: DShanon Brow  Ormond M.D.   On: 11/17/2021 13:46   DG CHEST PORT 1 VIEW  Result Date: 11/29/2021 CLINICAL DATA:  PICC  line placement. EXAM: PORTABLE CHEST 1 VIEW COMPARISON:  AP chest 11/26/2021 FINDINGS: Interval removal of the prior enteric tube. New right upper extremity PICC tip overlies the central superior vena cava. Cardiac silhouette and mediastinal contours are within normal limits. Mildly decreased lung volumes with mild bibasilar linear subsegmental atelectasis versus scarring. No pleural effusion or pneumothorax. No acute skeletal abnormality. IMPRESSION: New right upper extremity PICC tip overlies the central superior vena cava. Electronically Signed   By: Yvonne Kendall M.D.   On: 11/29/2021 10:42   DG Chest Portable 1 View  Result Date: 11/26/2021 CLINICAL DATA:  NG tube placement. EXAM: PORTABLE CHEST 1 VIEW COMPARISON:  Chest radiograph and chest CT 10/30/2021. CT abdomen and pelvis 11/26/2021. FINDINGS: An enteric tube courses into the abdomen with side hole overlying the gastric body and tip not imaged. The cardiomediastinal silhouette is unchanged. Lung volumes are low with bibasilar opacities. There is blunting of the costophrenic angles bilaterally without pleural effusions on today's earlier abdominal CT. No pneumothorax is identified. No acute osseous abnormality is seen. IMPRESSION: 1. Enteric tube reaching the stomach. 2. Low lung volumes with bibasilar atelectasis. Electronically Signed   By: Logan Bores M.D.   On: 11/26/2021 14:27   [place] EKG SITE RITE  Result Date: 11/28/2021 If Site Rite image not attached, placement could not be confirmed due to current cardiac rhythm.   Microbiology: Results for orders placed or performed during the hospital encounter of 11/16/21  Resp Panel by RT-PCR (Flu A&B, Covid) Nasopharyngeal Swab     Status: None   Collection Time: 11/16/21  7:27 AM   Specimen: Nasopharyngeal Swab; Nasopharyngeal(NP) swabs in vial transport medium  Result Value Ref Range Status   SARS Coronavirus 2 by RT PCR NEGATIVE NEGATIVE Final    Comment: (NOTE) SARS-CoV-2 target nucleic  acids are NOT DETECTED.  The SARS-CoV-2 RNA is generally detectable in upper respiratory specimens during the acute phase of infection. The lowest concentration of SARS-CoV-2 viral copies this assay can detect is 138 copies/mL. A negative result does not preclude SARS-Cov-2 infection and should not be used as the sole basis for treatment or other patient management decisions. A negative result may occur with  improper specimen collection/handling, submission of specimen other than nasopharyngeal swab, presence of viral mutation(s) within the areas targeted by this assay, and inadequate number of viral copies(<138 copies/mL). A negative result must be combined with clinical observations, patient history, and epidemiological information. The expected result is Negative.  Fact Sheet for Patients:  EntrepreneurPulse.com.au  Fact Sheet for Healthcare Providers:  IncredibleEmployment.be  This test is no t yet approved or cleared by the [place] FDA and  has been authorized for detection and/or diagnosis of SARS-CoV-2 by FDA under an Emergency Use Authorization (EUA). This EUA will remain  in effect (meaning this test can be used) for the duration of the COVID-19 declaration under Section 564(b)(1) of the Act, 21 U.S.C.section 360bbb-3(b)(1), unless the authorization is terminated  or revoked sooner.       Influenza A by PCR NEGATIVE NEGATIVE Final   Influenza B by PCR NEGATIVE NEGATIVE Final    Comment: (NOTE) The Xpert Xpress SARS-CoV-2/FLU/RSV plus assay is intended as an aid in the diagnosis of influenza from Nasopharyngeal swab specimens and should not be used as a sole basis for treatment. Nasal washings and aspirates are unacceptable for Xpert  Xpress SARS-CoV-2/FLU/RSV testing.  Fact Sheet for Patients: EntrepreneurPulse.com.au  Fact Sheet for Healthcare Providers: IncredibleEmployment.be  This  test is not yet approved or cleared by the [place] FDA and has been authorized for detection and/or diagnosis of SARS-CoV-2 by FDA under an Emergency Use Authorization (EUA). This EUA will remain in effect (meaning this test can be used) for the duration of the COVID-19 declaration under Section 564(b)(1) of the Act, 21 U.S.C. section 360bbb-3(b)(1), unless the authorization is terminated or revoked.  Performed at [facility], [address] [place],  [address]     Labs: CBC: Recent Labs  Lab 11/26/21 [phone] 11/27/21 0559 11/28/21 0525 11/29/21 0541 11/30/21 0536 12/02/21 0354  WBC 11.7* 10.7* 12.6* 11.5* 11.4* 11.1*  NEUTROABS 7.8*  --   --   --   --   --   HGB 13.4 10.8* 10.9* 10.1* 9.7* 9.6*  HCT 40.8 35.1* 33.8* 31.1* 29.8* 29.1*  MCV 94.4 97.0 97.7 96.3 95.8 93.9  PLT 500* 371 354 319 329 022   Basic Metabolic Panel: Recent Labs  Lab 11/27/21 0559 11/28/21 0525 11/29/21 0541 11/30/21 0536 12/01/21 0546 12/02/21 0354  NA 139 137 135 134* 133* 133*  K 3.3* 3.3* 3.0* 3.5 3.4* 3.8  CL 106 106 104 106 105 103  CO2 24 22 20* 20* 21* 23  GLUCOSE 128* 124* 122* 111* 136* 142*  BUN _0 CREATININE 0.68 0.68 0.62 0.64 0.58* 0.55*  CALCIUM 8.3* 8.2* 8.1* 7.9* 8.0* 8.2*  MG 2.0  --  1.8 2.0 1.7 1.8  PHOS  --   --  2.7 2.3* 2.5 2.6   Liver Function Tests: Recent Labs  Lab 11/26/21 0754 11/27/21 0559 11/30/21 0536 12/01/21 0546  AST _1 ALT _2 ALKPHOS 78 65 65 65  BILITOT 0.8 0.6 1.1 1.0  PROT 7.5 6.0* 5.8* 5.9*  ALBUMIN 3.6 3.0* 2.8* 2.7*   CBG: Recent Labs  Lab 12/01/21 1122 12/01/21 1628 12/01/21 1838 12/02/21 0028 12/02/21 0621  GLUCAP 156* 139* 142* 142* 152*    Discharge time spent: greater than 30 minutes.  Signed: Orson Eva, MD [facility] 12/02/2021

## 2021-12-01 NOTE — Progress Notes (Addendum)
Pt allowed to perform blood glucose check, did well with only minimal verbal coaching. Pt then administered himself the sliding scale insulin, did well. ?Pt's wife instructed on TPN administration. Wife able to prime tubing in preparation for admin, aseptically disconnected previous tubing from PICC, appropriately cleaned PICC port with CHG wipe and aseptically reconnected new tubing. Pt's wife able to verbalize clamping procedures for both PICC and TPN tubing. ?Questions encouraged from pt, wife and other family members in room and answered to their satisfaction. ?

## 2021-12-01 NOTE — Assessment & Plan Note (Addendum)
11/30/21 A1C--6.2 ?--sliding scale insulin while on TPN ?--I've educated patient and family about accu check and sliding scale insulin ?--nursing staff also assisted in educating patient regarding performing accu-checks and injecting insulin ?

## 2021-12-01 NOTE — Progress Notes (Signed)
PHARMACY - TOTAL PARENTERAL NUTRITION CONSULT NOTE  ? ?Indication: necrotizing pancreatitis with duodenal obstruction ? ?Patient Measurements: ?Height: '5\' 10"'$  (177.8 cm) ?Weight: 107.4 kg (236 lb 12.4 oz) ?IBW/kg (Calculated) : 73 ?TPN AdjBW (KG): 81.7 ?Body mass index is 33.97 kg/m?. ?Usual Weight:  ? ?Assessment:  ? ?Glucose / Insulin: 126-141: 3 units given ?Electrolytes: Na 133  K 3.4  Phos 2.5 ?Renal: WNL ?Hepatic: WNL ?Intake / Output; MIVF: NS w/ potassium 40 mEq/L @ 50 mL/hr ? ?Central access: PICC 3.9 ?TPN start date: 3/10 ? ?Nutritional Goals: ?Goal TPN rate is 100 mL/hr (provides 108 g of protein and 2136 kcals per day) ? ?RD Assessment: ?Estimated Needs ?Total Energy Estimated Needs: 2200-2300 ?Total Protein Estimated Needs: 105-114 gr ?Total Fluid Estimated Needs: >2 liters daily ? ?Current Nutrition:  ?NPO ? ?Plan:  ?Magnesium sulfate 1 gram IV x 1 ?Potassium chloride 10 mEq IV x 2 ?Increase TPN to 100 mL/hr at 1800 ?Electrolytes in TPN: Na 74mq/L, K 647m/L, Ca 22m68mL, Mg 22mE77m, and Phos 122mm1m. Cl:Ac 1:2 ?Add standard MVI and trace elements to TPN ?Initiate Resistant q6h SSI and adjust as needed  ?Stop MIVF at 1800 ?Monitor TPN labs on Mon/Thurs ? ?SteveMargot AblesrmD ?Clinical Pharmacist ?12/01/2021 8:26 AM ? ? ?

## 2021-12-01 NOTE — Progress Notes (Signed)
Diabetes education regarding blood sugar monitoring including use of meter & fingerstick completed with pt and wife. Pt able to give appropriate return demonstration. Advised pt that we would have him to perform his blood glucose fingerstick at 6pm for additional practice.Pt and wife stated understanding. ? ?Pt and wife then educated on use of insulin pen, needle exchange, site selection and rotation. Pt able to give appropriate return demonstration. ?

## 2021-12-01 NOTE — Progress Notes (Signed)
Subjective: ?Patient notes slight improvement today.  No abdominal pain.  No nausea or vomiting.  No complaints. ? ?Objective: ?Vital signs in last 24 hours: ?Temp:  [98 ?F (36.7 ?C)-98.7 ?F (37.1 ?C)] 98.7 ?F (37.1 ?C) (03/11 4259) ?Pulse Rate:  [86-97] 92 (03/11 0512) ?Resp:  [17] 17 (03/11 0512) ?BP: (114-137)/(72-85) 114/73 (03/11 1011) ?SpO2:  [97 %-98 %] 97 % (03/11 0512) ?Last BM Date : 11/30/21 ?General:   Alert and oriented, pleasant ?Head:  Normocephalic and atraumatic. ?Eyes:  No icterus, sclera clear. Conjuctiva pink.  ?Abdomen:  Bowel sounds present, soft, non-tender, non-distended. No HSM or hernias noted. No rebound or guarding. No masses appreciated  ?Msk:  Symmetrical without gross deformities. Normal posture. ?Extremities:  Without clubbing or edema. ?Neurologic:  Alert and  oriented x4;  grossly normal neurologically. ?Skin:  Warm and dry, intact without significant lesions.  ?Cervical Nodes:  No significant cervical adenopathy. ?Psych:  Alert and cooperative. Normal mood and affect. ? ?Intake/Output from previous day: ?03/10 0701 - 03/11 0700 ?In: 1135.7 [P.O.:720; I.V.:415.7] ?Out: 500 [Urine:500] ?Intake/Output this shift: ?Total I/O ?In: 370 [P.O.:360; I.V.:10] ?Out: 350 [Urine:350] ? ?Lab Results: ?Recent Labs  ?  11/29/21 ?5638 11/30/21 ?7564  ?WBC 11.5* 11.4*  ?HGB 10.1* 9.7*  ?HCT 31.1* 29.8*  ?PLT 319 329  ? ?BMET ?Recent Labs  ?  11/29/21 ?3329 11/30/21 ?5188 12/01/21 ?4166  ?NA 135 134* 133*  ?K 3.0* 3.5 3.4*  ?CL 104 106 105  ?CO2 20* 20* 21*  ?GLUCOSE 122* 111* 136*  ?BUN '10 9 8  '$ ?CREATININE 0.62 0.64 0.58*  ?CALCIUM 8.1* 7.9* 8.0*  ? ?LFT ?Recent Labs  ?  11/30/21 ?0630 12/01/21 ?1601  ?PROT 5.8* 5.9*  ?ALBUMIN 2.8* 2.7*  ?AST 22 20  ?ALT 15 17  ?ALKPHOS 65 65  ?BILITOT 1.1 1.0  ? ?PT/INR ?No results for input(s): LABPROT, INR in the last 72 hours. ?Hepatitis Panel ?No results for input(s): HEPBSAG, HCVAB, HEPAIGM, HEPBIGM in the last 72 hours. ? ? ?Studies/Results: ?No results  found. ? ?Assessment: ?*Pancreatitis complicated by formation of multiple pseudocysts ?*Gastric outlet obstruction-due to duodenitis/duodenal stricture in relation to above ?*Abdominal pain-improved ?*Nausea and vomiting-resolved ? ?Plan: ?Patient slowly improving. ? ?Continue twice daily PPI. ? ?Agree with TPN for nutritional supplementation.  Unable to tolerate enteral feedings given his duodenal stricture/duodenitis in setting of pancreatitis. ? ?He will need repeat CT abdomen pelvis in 4 to 6 weeks. ? ?Hopeful discharge for tomorrow. ? ?Elon Alas. Abbey Chatters, D.O. ?Gastroenterology and Hepatology ?[facility] ? ? LOS: 5 days  ? ? 12/01/2021, 11:12 AM ? ? ?

## 2021-12-02 ENCOUNTER — Telehealth: Payer: Self-pay | Admitting: Internal Medicine

## 2021-12-02 DIAGNOSIS — Z6833 Body mass index (BMI) 33.0-33.9, adult: Secondary | ICD-10-CM | POA: Diagnosis not present

## 2021-12-02 DIAGNOSIS — E782 Mixed hyperlipidemia: Secondary | ICD-10-CM | POA: Diagnosis not present

## 2021-12-02 DIAGNOSIS — K311 Adult hypertrophic pyloric stenosis: Secondary | ICD-10-CM | POA: Diagnosis not present

## 2021-12-02 DIAGNOSIS — I700 Atherosclerosis of aorta: Secondary | ICD-10-CM | POA: Diagnosis not present

## 2021-12-02 DIAGNOSIS — Z7901 Long term (current) use of anticoagulants: Secondary | ICD-10-CM | POA: Diagnosis not present

## 2021-12-02 DIAGNOSIS — I4892 Unspecified atrial flutter: Secondary | ICD-10-CM | POA: Diagnosis not present

## 2021-12-02 DIAGNOSIS — Z87891 Personal history of nicotine dependence: Secondary | ICD-10-CM | POA: Diagnosis not present

## 2021-12-02 DIAGNOSIS — Z9981 Dependence on supplemental oxygen: Secondary | ICD-10-CM | POA: Diagnosis not present

## 2021-12-02 DIAGNOSIS — E669 Obesity, unspecified: Secondary | ICD-10-CM

## 2021-12-02 DIAGNOSIS — G8929 Other chronic pain: Secondary | ICD-10-CM | POA: Diagnosis not present

## 2021-12-02 DIAGNOSIS — Z9181 History of falling: Secondary | ICD-10-CM | POA: Diagnosis not present

## 2021-12-02 DIAGNOSIS — Z452 Encounter for adjustment and management of vascular access device: Secondary | ICD-10-CM | POA: Diagnosis not present

## 2021-12-02 DIAGNOSIS — I10 Essential (primary) hypertension: Secondary | ICD-10-CM | POA: Diagnosis not present

## 2021-12-02 DIAGNOSIS — K5730 Diverticulosis of large intestine without perforation or abscess without bleeding: Secondary | ICD-10-CM | POA: Diagnosis not present

## 2021-12-02 DIAGNOSIS — M549 Dorsalgia, unspecified: Secondary | ICD-10-CM | POA: Diagnosis not present

## 2021-12-02 DIAGNOSIS — D62 Acute posthemorrhagic anemia: Secondary | ICD-10-CM | POA: Diagnosis not present

## 2021-12-02 DIAGNOSIS — K429 Umbilical hernia without obstruction or gangrene: Secondary | ICD-10-CM | POA: Diagnosis not present

## 2021-12-02 DIAGNOSIS — I4821 Permanent atrial fibrillation: Secondary | ICD-10-CM | POA: Diagnosis not present

## 2021-12-02 DIAGNOSIS — K861 Other chronic pancreatitis: Secondary | ICD-10-CM | POA: Diagnosis not present

## 2021-12-02 DIAGNOSIS — K219 Gastro-esophageal reflux disease without esophagitis: Secondary | ICD-10-CM | POA: Diagnosis not present

## 2021-12-02 DIAGNOSIS — G4733 Obstructive sleep apnea (adult) (pediatric): Secondary | ICD-10-CM | POA: Diagnosis not present

## 2021-12-02 DIAGNOSIS — Z794 Long term (current) use of insulin: Secondary | ICD-10-CM | POA: Diagnosis not present

## 2021-12-02 LAB — BASIC METABOLIC PANEL
Anion gap: 7 (ref 5–15)
BUN: 10 mg/dL (ref 8–23)
CO2: 23 mmol/L (ref 22–32)
Calcium: 8.2 mg/dL — ABNORMAL LOW (ref 8.9–10.3)
Chloride: 103 mmol/L (ref 98–111)
Creatinine, Ser: 0.55 mg/dL — ABNORMAL LOW (ref 0.61–1.24)
GFR, Estimated: >60 mL/min (ref >60)
Glucose, Bld: 142 mg/dL — ABNORMAL HIGH (ref 70–99)
Potassium: 3.8 mmol/L (ref 3.5–5.1)
Sodium: 133 mmol/L — ABNORMAL LOW (ref 135–145)

## 2021-12-02 LAB — PHOSPHORUS: Phosphorus: 2.6 mg/dL (ref 2.5–4.6)

## 2021-12-02 LAB — CBC
HCT: 29.1 % — ABNORMAL LOW (ref 39.0–52.0)
Hemoglobin: 9.6 g/dL — ABNORMAL LOW (ref 13.0–17.0)
MCH: 31 pg (ref 26.0–34.0)
MCHC: 33 g/dL (ref 30.0–36.0)
MCV: 93.9 fL (ref 80.0–100.0)
Platelets: 321 10*3/uL (ref 150–400)
RBC: 3.1 MIL/uL — ABNORMAL LOW (ref 4.22–5.81)
RDW: 13.4 % (ref 11.5–15.5)
WBC: 11.1 10*3/uL — ABNORMAL HIGH (ref 4.0–10.5)
nRBC: 0 % (ref 0.0–0.2)

## 2021-12-02 LAB — GLUCOSE, CAPILLARY
Glucose-Capillary: 142 mg/dL — ABNORMAL HIGH (ref 70–99)
Glucose-Capillary: 152 mg/dL — ABNORMAL HIGH (ref 70–99)
Glucose-Capillary: 156 mg/dL — ABNORMAL HIGH (ref 70–99)

## 2021-12-02 LAB — MAGNESIUM: Magnesium: 1.8 mg/dL (ref 1.7–2.4)

## 2021-12-02 MED ORDER — HEPARIN SOD (PORK) LOCK FLUSH 100 UNIT/ML IV SOLN
250.0000 [IU] | INTRAVENOUS | Status: AC | PRN
  Administered 2021-12-02: 250 [IU]
  Filled 2021-12-02: qty 5

## 2021-12-02 MED ORDER — BLOOD GLUCOSE MONITOR KIT: PACK | 0 refills | Status: DC

## 2021-12-02 MED ORDER — INSULIN PEN NEEDLE 31G X 4 MM MISC: 1 refills | Status: DC

## 2021-12-02 MED ORDER — INSULIN ASPART FLEXPEN 100 UNIT/ML ~~LOC~~ SOPN
PEN_INJECTOR | SUBCUTANEOUS | 1 refills | Status: DC
Start: 2021-12-02 — End: 2022-05-17

## 2021-12-02 MED ORDER — INSULIN PEN NEEDLE 31G X 4 MM MISC
1 refills | Status: DC
Start: 2021-12-02 — End: 2022-05-17

## 2021-12-02 MED ORDER — INSULIN ASPART 100 UNIT/ML FLEXPEN
PEN_INJECTOR | SUBCUTANEOUS | 1 refills | Status: DC
Start: 2021-12-02 — End: 2021-12-02

## 2021-12-02 MED ORDER — BLOOD GLUCOSE MONITOR KIT
PACK | 0 refills | Status: DC
Start: 2021-12-02 — End: 2022-05-17

## 2021-12-02 MED ORDER — TRAVASOL 10 % IV SOLN
INTRAVENOUS | Status: DC
  Filled 2021-12-02: qty 1080

## 2021-12-02 NOTE — TOC Progression Note (Signed)
Transition of Care (TOC) - Progression Note  ? ? ?Patient Details  ?Name: LUCUS LAMBERTSON ?MRN: [id number] ?Date of Birth: Jan 20, 1946 ? ?Transition of Care (TOC) CM/SW Contact  ?Kerin Salen, RN ?Phone Number: ?12/02/2021, 4:29 PM ? ? ? ?Expected Discharge Plan: [place] ?Barriers to Discharge: Barriers Resolved ? ?Expected Discharge Plan and Services ?Expected Discharge Plan: [place] ?In-house Referral: Clinical Social Work ?Discharge Planning Services: CM Consult ?Post Acute Care Choice: Home Health ?Living arrangements for the past 2 months: [place] ?Expected Discharge Date: 12/02/21               ?DME Arranged: Other see comment (TPN infusion) ?DME Agency: [facility] ?Date DME Agency Contacted: 12/01/21 ?  ?Representative spoke with at DME Agency: Tommi Rumps and Pam ?[place] Arranged: IV Antibiotics, RN ?[place] Agency: [facility] ?Date HH Agency Contacted: 12/01/21 ?  ?Representative spoke with at [place]: Tommi Rumps ? ? ?Social Determinants of Health (SDOH) Interventions ?  ? ?Readmission Risk Interventions ?Readmission Risk Prevention Plan 11/27/2021 11/19/2021 11/02/2021  ?Transportation Screening Complete Complete Complete  ?PCP or Specialist Appt within 5-7 Days - Complete -  ?Home Care Screening Complete Complete Complete  ?Medication Review (RN CM) Complete Complete Complete  ?Some recent data might be hidden  ? ? ?

## 2021-12-02 NOTE — Telephone Encounter (Signed)
Patient needs urgent hospital follow-up within 3 to 4 weeks in our clinic.  Can we set this up?  Okay to schedule with APP or myself. Thanks ?

## 2021-12-02 NOTE — Progress Notes (Addendum)
PHARMACY - TOTAL PARENTERAL NUTRITION CONSULT NOTE  ? ?Indication: necrotizing pancreatitis with duodenal obstruction ? ?Patient Measurements: ?Height: '5\' 10"'$  (177.8 cm) ?Weight: 107.4 kg (236 lb 12.4 oz) ?IBW/kg (Calculated) : 73 ?TPN AdjBW (KG): 81.7 ?Body mass index is 33.97 kg/m?. ?Usual Weight:  ? ?Assessment:  ? ?Glucose / Insulin: 139-156: 14 units in 24 hours ?Electrolytes: Na 133  K 3.4>3.8  Phos 2.6 ?Renal: WNL ?Hepatic: WNL ?Albumin 2.7 ? ? ?Central access: PICC 3/9 ?TPN start date: 3/10 ? ?Nutritional Goals: ?Goal TPN rate is 100 mL/hr (provides 108 g of protein and 2136 kcals per day) ? ?RD Assessment: ?Estimated Needs ?Total Energy Estimated Needs: 2200-2300 ?Total Protein Estimated Needs: 105-114 gr ?Total Fluid Estimated Needs: >2 liters daily ? ?Current Nutrition:  ?NPO ? ?Plan:  ?Continue TPN to 100 mL/hr at 1800 ?Electrolytes in TPN: Na 82mq/L, K 6108m/L, Ca 49m32mL, Mg 49mE36m, and Phos 149mm69m. Cl:Ac 1:2 ?Add standard MVI and trace elements to TPN ?Add 10 units insulin to TPN  ?Continue Resistant q6h SSI and adjust as needed  ?Monitor TPN labs on Mon/Thurs ? ?SteveMargot AblesrmD ?Clinical Pharmacist ?12/02/2021 8:14 AM ? ? ?

## 2021-12-02 NOTE — Progress Notes (Signed)
Nsg Discharge Note ? ?Admit Date:  11/26/2021 ?Discharge date: 12/02/2021 ?  ?Sean Golden to be D/C'd Home per MD order.  AVS completed.  Patient/caregiver able to verbalize understanding. ? ?Discharge Medication: ?Allergies as of 12/02/2021   ?No Known Allergies ?  ? ?  ?Medication List  ?  ? ?TAKE these medications   ? ?acetaminophen 325 MG tablet ?Commonly known as: TYLENOL ?Take 2 tablets (650 mg total) by mouth every 6 (six) hours as needed for mild pain, fever or headache (or Fever >/= 101). ?  ?blood glucose meter kit and supplies Kit ?Dispense based on patient and insurance preference. Use up to four times daily as directed. ?  ?brimonidine 0.2 % ophthalmic solution ?Commonly known as: ALPHAGAN ?Place 1 drop into the right eye 2 (two) times daily. ?  ?diltiazem 240 MG 24 hr capsule ?Commonly known as: CARDIZEM CD ?Take 1 capsule (240 mg total) by mouth daily. ?  ?Eliquis 5 MG Tabs tablet ?Generic drug: apixaban ?TAKE ONE TABLET ($RemoveBef'5MG'KvcGGNUcdq$  TOTAL) BY MOUTH TWOTIMES DAILY ?What changed: See the new instructions. ?  ?feeding supplement Liqd ?Take 237 mLs by mouth 3 (three) times daily between meals. ?  ?furosemide 40 MG tablet ?Commonly known as: Lasix ?Take 1 tablet (40 mg total) by mouth every other day. ?  ?insulin aspart 100 UNIT/ML FlexPen ?Commonly known as: NOVOLOG ?Sugar 121-150--2 units; 151-200--3 units; 201-250--5 units; 251-300--8 units; 301-350--11units; 351-400--15 units Dispense every 6 hours ?  ?Insulin Pen Needle 31G X 4 MM Misc ?Use with insulin pen to dispense insulin as directed ?  ?pantoprazole 40 MG tablet ?Commonly known as: PROTONIX ?Take 1 tablet (40 mg total) by mouth 2 (two) times daily. ?  ?polyethylene glycol 17 g packet ?Commonly known as: MIRALAX / GLYCOLAX ?Take 17 g by mouth daily. ?  ?potassium chloride 10 MEQ tablet ?Commonly known as: KLOR-CON M ?Take 1 tablet (10 mEq total) by mouth every other day. ?  ?tamsulosin 0.4 MG Caps capsule ?Commonly known as: FLOMAX ?Take 2 capsules (0.8  mg total) by mouth every evening. ?  ? ?  ? ? ?Discharge Assessment: ?Vitals:  ? 12/02/21 0429 12/02/21 0446  ?BP: (!) 141/100 120/76  ?Pulse: 87 70  ?Resp: 18 18  ?Temp: 98.9 ?F (37.2 ?C) 98.9 ?F (37.2 ?C)  ?SpO2: 97% 97%  ? Skin clean, dry and intact without evidence of skin break down, no evidence of skin tears noted. ?IV catheter discontinued intact. Site without signs and symptoms of complications - no redness or edema noted at insertion site, patient denies c/o pain - only slight tenderness at site.  Dressing with slight pressure applied. ? ?D/c Instructions-Education: ?Discharge instructions given to patient/family with verbalized understanding. ?D/c education completed with patient/family including follow up instructions, medication list, d/c activities limitations if indicated, with other d/c instructions as indicated by MD - patient able to verbalize understanding, all questions fully answered. ?Patient instructed to return to ED, call 911, or call MD for any changes in condition.  ?Patient escorted via [place], and D/C home via private auto. ? ?Berton Bon, RN ?12/02/2021 2:35 PM  ?

## 2021-12-03 DIAGNOSIS — K922 Gastrointestinal hemorrhage, unspecified: Secondary | ICD-10-CM | POA: Diagnosis not present

## 2021-12-03 DIAGNOSIS — D62 Acute posthemorrhagic anemia: Secondary | ICD-10-CM | POA: Diagnosis not present

## 2021-12-03 DIAGNOSIS — R112 Nausea with vomiting, unspecified: Secondary | ICD-10-CM | POA: Diagnosis not present

## 2021-12-03 DIAGNOSIS — K219 Gastro-esophageal reflux disease without esophagitis: Secondary | ICD-10-CM | POA: Diagnosis not present

## 2021-12-03 DIAGNOSIS — K8591 Acute pancreatitis with uninfected necrosis, unspecified: Secondary | ICD-10-CM | POA: Diagnosis not present

## 2021-12-03 DIAGNOSIS — K861 Other chronic pancreatitis: Secondary | ICD-10-CM | POA: Diagnosis not present

## 2021-12-03 DIAGNOSIS — K311 Adult hypertrophic pyloric stenosis: Secondary | ICD-10-CM | POA: Diagnosis not present

## 2021-12-04 ENCOUNTER — Encounter: Payer: Self-pay | Admitting: Internal Medicine

## 2021-12-06 DIAGNOSIS — K5730 Diverticulosis of large intestine without perforation or abscess without bleeding: Secondary | ICD-10-CM | POA: Diagnosis not present

## 2021-12-06 DIAGNOSIS — Z6833 Body mass index (BMI) 33.0-33.9, adult: Secondary | ICD-10-CM | POA: Diagnosis not present

## 2021-12-06 DIAGNOSIS — Z452 Encounter for adjustment and management of vascular access device: Secondary | ICD-10-CM | POA: Diagnosis not present

## 2021-12-06 DIAGNOSIS — D62 Acute posthemorrhagic anemia: Secondary | ICD-10-CM | POA: Diagnosis not present

## 2021-12-06 DIAGNOSIS — K311 Adult hypertrophic pyloric stenosis: Secondary | ICD-10-CM | POA: Diagnosis not present

## 2021-12-06 DIAGNOSIS — K429 Umbilical hernia without obstruction or gangrene: Secondary | ICD-10-CM | POA: Diagnosis not present

## 2021-12-06 DIAGNOSIS — I700 Atherosclerosis of aorta: Secondary | ICD-10-CM | POA: Diagnosis not present

## 2021-12-06 DIAGNOSIS — K861 Other chronic pancreatitis: Secondary | ICD-10-CM | POA: Diagnosis not present

## 2021-12-06 DIAGNOSIS — E669 Obesity, unspecified: Secondary | ICD-10-CM | POA: Diagnosis not present

## 2021-12-06 DIAGNOSIS — I4892 Unspecified atrial flutter: Secondary | ICD-10-CM | POA: Diagnosis not present

## 2021-12-06 DIAGNOSIS — Z87891 Personal history of nicotine dependence: Secondary | ICD-10-CM | POA: Diagnosis not present

## 2021-12-06 DIAGNOSIS — Z7901 Long term (current) use of anticoagulants: Secondary | ICD-10-CM | POA: Diagnosis not present

## 2021-12-06 DIAGNOSIS — I4821 Permanent atrial fibrillation: Secondary | ICD-10-CM | POA: Diagnosis not present

## 2021-12-06 DIAGNOSIS — I10 Essential (primary) hypertension: Secondary | ICD-10-CM | POA: Diagnosis not present

## 2021-12-06 DIAGNOSIS — G4733 Obstructive sleep apnea (adult) (pediatric): Secondary | ICD-10-CM | POA: Diagnosis not present

## 2021-12-06 DIAGNOSIS — K219 Gastro-esophageal reflux disease without esophagitis: Secondary | ICD-10-CM | POA: Diagnosis not present

## 2021-12-06 DIAGNOSIS — G8929 Other chronic pain: Secondary | ICD-10-CM | POA: Diagnosis not present

## 2021-12-06 DIAGNOSIS — Z794 Long term (current) use of insulin: Secondary | ICD-10-CM | POA: Diagnosis not present

## 2021-12-06 DIAGNOSIS — M549 Dorsalgia, unspecified: Secondary | ICD-10-CM | POA: Diagnosis not present

## 2021-12-06 DIAGNOSIS — Z9181 History of falling: Secondary | ICD-10-CM | POA: Diagnosis not present

## 2021-12-06 DIAGNOSIS — Z9981 Dependence on supplemental oxygen: Secondary | ICD-10-CM | POA: Diagnosis not present

## 2021-12-06 DIAGNOSIS — E782 Mixed hyperlipidemia: Secondary | ICD-10-CM | POA: Diagnosis not present

## 2021-12-10 DIAGNOSIS — K311 Adult hypertrophic pyloric stenosis: Secondary | ICD-10-CM | POA: Diagnosis not present

## 2021-12-10 DIAGNOSIS — K861 Other chronic pancreatitis: Secondary | ICD-10-CM | POA: Diagnosis not present

## 2021-12-11 NOTE — Telephone Encounter (Signed)
You have not seen this pt before but they have sent paperwork from [facility] to be filled out. It will be on your desk ?

## 2021-12-12 DIAGNOSIS — K311 Adult hypertrophic pyloric stenosis: Secondary | ICD-10-CM | POA: Diagnosis not present

## 2021-12-12 DIAGNOSIS — K8591 Acute pancreatitis with uninfected necrosis, unspecified: Secondary | ICD-10-CM | POA: Diagnosis not present

## 2021-12-12 DIAGNOSIS — K922 Gastrointestinal hemorrhage, unspecified: Secondary | ICD-10-CM | POA: Diagnosis not present

## 2021-12-12 DIAGNOSIS — R112 Nausea with vomiting, unspecified: Secondary | ICD-10-CM | POA: Diagnosis not present

## 2021-12-12 DIAGNOSIS — K861 Other chronic pancreatitis: Secondary | ICD-10-CM | POA: Diagnosis not present

## 2021-12-12 NOTE — Telephone Encounter (Signed)
Debbie from [place] phoned and wanted to speak to you regarding signing off on some things for this pt. She can be reached @ [phone]. Paperwork was put on your desk yesterday to your right in the tray. ?

## 2021-12-12 NOTE — Telephone Encounter (Signed)
Called and spoke with her.  Paperwork signed and placed on your desk ?

## 2021-12-13 NOTE — Telephone Encounter (Signed)
Paperwork faxed and confirmation sent ?

## 2021-12-15 DIAGNOSIS — K8591 Acute pancreatitis with uninfected necrosis, unspecified: Secondary | ICD-10-CM | POA: Diagnosis not present

## 2021-12-15 DIAGNOSIS — K922 Gastrointestinal hemorrhage, unspecified: Secondary | ICD-10-CM | POA: Diagnosis not present

## 2021-12-15 DIAGNOSIS — K311 Adult hypertrophic pyloric stenosis: Secondary | ICD-10-CM | POA: Diagnosis not present

## 2021-12-15 DIAGNOSIS — R112 Nausea with vomiting, unspecified: Secondary | ICD-10-CM | POA: Diagnosis not present

## 2021-12-15 DIAGNOSIS — K861 Other chronic pancreatitis: Secondary | ICD-10-CM | POA: Diagnosis not present

## 2021-12-19 DIAGNOSIS — K311 Adult hypertrophic pyloric stenosis: Secondary | ICD-10-CM | POA: Diagnosis not present

## 2021-12-19 DIAGNOSIS — K861 Other chronic pancreatitis: Secondary | ICD-10-CM | POA: Diagnosis not present

## 2021-12-19 DIAGNOSIS — K5730 Diverticulosis of large intestine without perforation or abscess without bleeding: Secondary | ICD-10-CM | POA: Diagnosis not present

## 2021-12-22 DIAGNOSIS — K8591 Acute pancreatitis with uninfected necrosis, unspecified: Secondary | ICD-10-CM | POA: Diagnosis not present

## 2021-12-22 DIAGNOSIS — K861 Other chronic pancreatitis: Secondary | ICD-10-CM | POA: Diagnosis not present

## 2021-12-22 DIAGNOSIS — K311 Adult hypertrophic pyloric stenosis: Secondary | ICD-10-CM | POA: Diagnosis not present

## 2021-12-22 DIAGNOSIS — K922 Gastrointestinal hemorrhage, unspecified: Secondary | ICD-10-CM | POA: Diagnosis not present

## 2021-12-22 DIAGNOSIS — R112 Nausea with vomiting, unspecified: Secondary | ICD-10-CM | POA: Diagnosis not present

## 2021-12-24 NOTE — Telephone Encounter (Signed)
The pt's lab work is on your desk to the left ?

## 2021-12-26 DIAGNOSIS — H21261 Iris atrophy (essential) (progressive), right eye: Secondary | ICD-10-CM | POA: Diagnosis not present

## 2021-12-26 DIAGNOSIS — H4051X0 Glaucoma secondary to other eye disorders, right eye, stage unspecified: Secondary | ICD-10-CM | POA: Diagnosis not present

## 2021-12-26 DIAGNOSIS — H21269 Iris atrophy (essential) (progressive), unspecified eye: Secondary | ICD-10-CM | POA: Diagnosis not present

## 2021-12-26 DIAGNOSIS — H2513 Age-related nuclear cataract, bilateral: Secondary | ICD-10-CM | POA: Diagnosis not present

## 2021-12-26 DIAGNOSIS — H4050X0 Glaucoma secondary to other eye disorders, unspecified eye, stage unspecified: Secondary | ICD-10-CM | POA: Diagnosis not present

## 2021-12-26 DIAGNOSIS — H1820 Unspecified corneal edema: Secondary | ICD-10-CM | POA: Diagnosis not present

## 2021-12-27 NOTE — Telephone Encounter (Signed)
Paperwork from [facility] needing your signature RE: order console reports for the pt and I will return the fax because they are saying it is 1 month out of medicare compliance ?

## 2021-12-28 DIAGNOSIS — Z9181 History of falling: Secondary | ICD-10-CM | POA: Diagnosis not present

## 2021-12-28 DIAGNOSIS — I700 Atherosclerosis of aorta: Secondary | ICD-10-CM | POA: Diagnosis not present

## 2021-12-28 DIAGNOSIS — Z794 Long term (current) use of insulin: Secondary | ICD-10-CM | POA: Diagnosis not present

## 2021-12-28 DIAGNOSIS — G4733 Obstructive sleep apnea (adult) (pediatric): Secondary | ICD-10-CM | POA: Diagnosis not present

## 2021-12-28 DIAGNOSIS — Z452 Encounter for adjustment and management of vascular access device: Secondary | ICD-10-CM | POA: Diagnosis not present

## 2021-12-28 DIAGNOSIS — G8929 Other chronic pain: Secondary | ICD-10-CM | POA: Diagnosis not present

## 2021-12-28 DIAGNOSIS — Z9981 Dependence on supplemental oxygen: Secondary | ICD-10-CM | POA: Diagnosis not present

## 2021-12-28 DIAGNOSIS — K311 Adult hypertrophic pyloric stenosis: Secondary | ICD-10-CM | POA: Diagnosis not present

## 2021-12-28 DIAGNOSIS — Z87891 Personal history of nicotine dependence: Secondary | ICD-10-CM | POA: Diagnosis not present

## 2021-12-28 DIAGNOSIS — I10 Essential (primary) hypertension: Secondary | ICD-10-CM | POA: Diagnosis not present

## 2021-12-28 DIAGNOSIS — E669 Obesity, unspecified: Secondary | ICD-10-CM | POA: Diagnosis not present

## 2021-12-28 DIAGNOSIS — M549 Dorsalgia, unspecified: Secondary | ICD-10-CM | POA: Diagnosis not present

## 2021-12-28 DIAGNOSIS — I4892 Unspecified atrial flutter: Secondary | ICD-10-CM | POA: Diagnosis not present

## 2021-12-28 DIAGNOSIS — E782 Mixed hyperlipidemia: Secondary | ICD-10-CM | POA: Diagnosis not present

## 2021-12-28 DIAGNOSIS — D62 Acute posthemorrhagic anemia: Secondary | ICD-10-CM | POA: Diagnosis not present

## 2021-12-28 DIAGNOSIS — K5730 Diverticulosis of large intestine without perforation or abscess without bleeding: Secondary | ICD-10-CM | POA: Diagnosis not present

## 2021-12-28 DIAGNOSIS — K861 Other chronic pancreatitis: Secondary | ICD-10-CM | POA: Diagnosis not present

## 2021-12-28 DIAGNOSIS — Z6833 Body mass index (BMI) 33.0-33.9, adult: Secondary | ICD-10-CM | POA: Diagnosis not present

## 2021-12-28 DIAGNOSIS — Z7901 Long term (current) use of anticoagulants: Secondary | ICD-10-CM | POA: Diagnosis not present

## 2021-12-28 DIAGNOSIS — K219 Gastro-esophageal reflux disease without esophagitis: Secondary | ICD-10-CM | POA: Diagnosis not present

## 2021-12-28 DIAGNOSIS — I4821 Permanent atrial fibrillation: Secondary | ICD-10-CM | POA: Diagnosis not present

## 2021-12-28 DIAGNOSIS — K429 Umbilical hernia without obstruction or gangrene: Secondary | ICD-10-CM | POA: Diagnosis not present

## 2021-12-29 DIAGNOSIS — K8591 Acute pancreatitis with uninfected necrosis, unspecified: Secondary | ICD-10-CM | POA: Diagnosis not present

## 2021-12-29 DIAGNOSIS — K922 Gastrointestinal hemorrhage, unspecified: Secondary | ICD-10-CM | POA: Diagnosis not present

## 2021-12-29 DIAGNOSIS — K861 Other chronic pancreatitis: Secondary | ICD-10-CM | POA: Diagnosis not present

## 2021-12-29 DIAGNOSIS — K311 Adult hypertrophic pyloric stenosis: Secondary | ICD-10-CM | POA: Diagnosis not present

## 2021-12-29 DIAGNOSIS — R112 Nausea with vomiting, unspecified: Secondary | ICD-10-CM | POA: Diagnosis not present

## 2022-01-01 NOTE — Telephone Encounter (Signed)
Dr. Abbey Chatters ?I spoke with Mariann Laster from [facility] regarding documentation needing to be signed by you. I advised we haven't seen this pt here in the office since 2016. We had recently scheduled appts for pt but he canceled or no showed. She stated where you had sent orders on 12/13/2021 I advised it had to been from hospital because it didn't come from here and when she looked she seen it. The pt has a visit for 01/10/2022 and she states she will call back then.  ?

## 2022-01-03 DIAGNOSIS — K311 Adult hypertrophic pyloric stenosis: Secondary | ICD-10-CM | POA: Diagnosis not present

## 2022-01-03 DIAGNOSIS — K8591 Acute pancreatitis with uninfected necrosis, unspecified: Secondary | ICD-10-CM | POA: Diagnosis not present

## 2022-01-03 DIAGNOSIS — K861 Other chronic pancreatitis: Secondary | ICD-10-CM | POA: Diagnosis not present

## 2022-01-03 DIAGNOSIS — R112 Nausea with vomiting, unspecified: Secondary | ICD-10-CM | POA: Diagnosis not present

## 2022-01-03 DIAGNOSIS — K922 Gastrointestinal hemorrhage, unspecified: Secondary | ICD-10-CM | POA: Diagnosis not present

## 2022-01-04 DIAGNOSIS — K861 Other chronic pancreatitis: Secondary | ICD-10-CM | POA: Diagnosis not present

## 2022-01-04 DIAGNOSIS — K311 Adult hypertrophic pyloric stenosis: Secondary | ICD-10-CM | POA: Diagnosis not present

## 2022-01-04 DIAGNOSIS — K5730 Diverticulosis of large intestine without perforation or abscess without bleeding: Secondary | ICD-10-CM | POA: Diagnosis not present

## 2022-01-05 DIAGNOSIS — R112 Nausea with vomiting, unspecified: Secondary | ICD-10-CM | POA: Diagnosis not present

## 2022-01-05 DIAGNOSIS — K861 Other chronic pancreatitis: Secondary | ICD-10-CM | POA: Diagnosis not present

## 2022-01-05 DIAGNOSIS — K8591 Acute pancreatitis with uninfected necrosis, unspecified: Secondary | ICD-10-CM | POA: Diagnosis not present

## 2022-01-05 DIAGNOSIS — K311 Adult hypertrophic pyloric stenosis: Secondary | ICD-10-CM | POA: Diagnosis not present

## 2022-01-05 DIAGNOSIS — K922 Gastrointestinal hemorrhage, unspecified: Secondary | ICD-10-CM | POA: Diagnosis not present

## 2022-01-07 ENCOUNTER — Ambulatory Visit: Payer: PPO | Admitting: Internal Medicine

## 2022-01-10 ENCOUNTER — Telehealth: Payer: Self-pay

## 2022-01-10 ENCOUNTER — Encounter: Payer: Self-pay | Admitting: *Deleted

## 2022-01-10 ENCOUNTER — Ambulatory Visit (INDEPENDENT_AMBULATORY_CARE_PROVIDER_SITE_OTHER): Payer: PPO | Admitting: Internal Medicine

## 2022-01-10 VITALS — BP 120/74 | HR 79 | Temp 97.7°F | Ht 70.0 in | Wt 233.0 lb

## 2022-01-10 DIAGNOSIS — Z789 Other specified health status: Secondary | ICD-10-CM | POA: Diagnosis not present

## 2022-01-10 DIAGNOSIS — R112 Nausea with vomiting, unspecified: Secondary | ICD-10-CM | POA: Diagnosis not present

## 2022-01-10 DIAGNOSIS — K8511 Biliary acute pancreatitis with uninfected necrosis: Secondary | ICD-10-CM

## 2022-01-10 DIAGNOSIS — K2980 Duodenitis without bleeding: Secondary | ICD-10-CM

## 2022-01-10 DIAGNOSIS — K8591 Acute pancreatitis with uninfected necrosis, unspecified: Secondary | ICD-10-CM | POA: Diagnosis not present

## 2022-01-10 DIAGNOSIS — K315 Obstruction of duodenum: Secondary | ICD-10-CM

## 2022-01-10 DIAGNOSIS — K861 Other chronic pancreatitis: Secondary | ICD-10-CM | POA: Diagnosis not present

## 2022-01-10 DIAGNOSIS — K922 Gastrointestinal hemorrhage, unspecified: Secondary | ICD-10-CM | POA: Diagnosis not present

## 2022-01-10 DIAGNOSIS — K311 Adult hypertrophic pyloric stenosis: Secondary | ICD-10-CM | POA: Diagnosis not present

## 2022-01-10 MED ORDER — PANTOPRAZOLE SODIUM 40 MG PO TBEC: 40.0000 mg | DELAYED_RELEASE_TABLET | Freq: Two times a day (BID) | ORAL | 5 refills | Status: DC

## 2022-01-10 NOTE — Patient Instructions (Signed)
I am happy to hear that you are feeling better. ? ?I think at this point we will try to transition you off of TPN.  I want you to start introducing food in your diet.  I would keep it soft.  Try not to overdo it.  If you start to have pain or nausea or vomiting then I want you to slow down. ? ?Continue to flush your PICC line regularly. ? ?Let me know in 4 to 5 days how you are doing.  If tolerating diet well then we can hopefully remove your PICC line. ? ?I am going to order CT abdomen pelvis to be performed to relook at your pancreas.  I will call you with these results. ? ?We will need to repeat EGD at some point though we will hold off for now.  Continue on pantoprazole twice daily.  I sent refills in today. ? ?It was great seeing both you again today. ? ?Dr. Abbey Chatters ? ?At [facility] we value your feedback. You may receive a survey about your visit today. Please share your experience as we strive to create trusting relationships with our patients to provide genuine, compassionate, quality care. ? ?We appreciate your understanding and patience as we review any laboratory studies, imaging, and other diagnostic tests that are ordered as we care for you. Our office policy is 5 business days for review of these results, and any emergent or urgent results are addressed in a timely manner for your best interest. If you do not hear from our office in 1 week, please contact us.  ? ?We also encourage the use of MyChart, which contains your medical information for your review as well. If you are not enrolled in this feature, an access code is on this after visit summary for your convenience. Thank you for allowing [place] to be involved in your care. ? ?It was great to see you today!  I hope you have a great rest of your Spring! ? ? ? ?Sean Golden. Abbey Chatters, D.O. ?Gastroenterology and Hepatology ?[facility] ? ?

## 2022-01-10 NOTE — Telephone Encounter (Signed)
Spoke with her

## 2022-01-10 NOTE — Telephone Encounter (Signed)
Please call Patty @ [phone] regarding the pt's TPN. She states she needs to speak with you in the next few hours. She spoke to the pt's wife and she had been advised of changes you had made. I faxed the paperwork to the facility this morning. ?

## 2022-01-10 NOTE — Progress Notes (Signed)
? ? ?Referring Provider: Celene Squibb, MD ?Primary Care Physician:  Celene Squibb, MD ?Primary GI:  Dr. Abbey Chatters ? ?Chief Complaint  ?Patient presents with  ? Follow-up  ?  Hospital follow-up and paperwork to be signed  ? ? ?HPI:   ?Sean Golden is a 76 y.o. male who presents to the clinic today for hospital follow-up visit.  History of of A-fib on Eliquis, hypertension, GERD, necrotizing pancreatitis. ? ?Patient was discharged from [facility] on February 20 after prolonged hospitalization for necrotizing pancreatitis.  Readmitted on February 24 with hematemesis in the setting of Eliquis, he underwent EGD on February 25 with evidence of hematin in gastric fundus status post suctioning, NG trauma in the antrum, nonbleeding duodenal ulcer, duodenitis with noncritical luminal narrowing involving second portion of duodenum.  CT February 25 with contrast revealed interval worsening of complicated/necrotizing pancreatitis with interval increase in peripancreatic fluid and inflammation, development of adjacent acute necrotic collections.  He was discharged on February 27 with PPI twice daily, full liquid diet.   ? ?Presented back  to the ED March 6 with hematemesis, CT with findings of severe distention of the distal esophagus and stomach with a transition point in the duodenum which was new from prior, residual inflammatory changes around the pancreas, majority of pseudocyst collections having decreased in size with the exception of collection near the pancreatic tail, with noted gas lateral to the duodenum.  ? ?Patient was unable to tolerate tube feeds due to duodenitis/duodenal stricture.  Was transitioned to TPN and discharge.  Has been tolerating TPN since discharge.  Has not introduced any PO food into his diet.  Able to tolerate clear liquids and Jell-O.   ? ?Past Medical History:  ?Diagnosis Date  ? Atrial fibrillation ([place]) 05/2014  ? Essential hypertension   ? GERD (gastroesophageal reflux disease)   ?  Helicobacter pylori gastritis 08/2014  ? Treated with Pylera  ? Pancreatitis   ? Sleep apnea   ? ? ?Past Surgical History:  ?Procedure Laterality Date  ? CARDIAC ELECTROPHYSIOLOGY STUDY AND ABLATION  2010?  ? COLONOSCOPY  2005  ? SOLITARY RECTAL ULCER  ? COLONOSCOPY N/A 09/06/2014  ? Dr. Oneida Alar: three colon polyps, moderate diverticulosis throughout the entire examined colon  ? ESOPHAGOGASTRODUODENOSCOPY N/A 09/06/2014  ? Dr. Fields:moderate erosive gastritis, no Barrett's. +H.pylori gastritis, treated with Pylera  ? ESOPHAGOGASTRODUODENOSCOPY (EGD) WITH PROPOFOL N/A 11/17/2021  ? Procedure: ESOPHAGOGASTRODUODENOSCOPY (EGD) WITH PROPOFOL;  Surgeon: Rogene Houston, MD;  [facility]: [facility];  Service: Endoscopy;  Laterality: N/A;  ? ESOPHAGOGASTRODUODENOSCOPY (EGD) WITH PROPOFOL N/A 11/27/2021  ? Procedure: ESOPHAGOGASTRODUODENOSCOPY (EGD) WITH PROPOFOL;  Surgeon: Harvel Quale, MD;  Location: [facility];  Service: Gastroenterology;  Laterality: N/A;  ? HEMORRHOID BANDING N/A 09/06/2014  ? NO BANDING PERFORMED. NO INTERNAL HEMORRHOIDS IDENTIFIED   ? UPPER GASTROINTESTINAL ENDOSCOPY  2005 RMR  ? VASECTOMY    ? ? ?Current Outpatient Medications  ?Medication Sig Dispense Refill  ? acetaminophen (TYLENOL) 325 MG tablet Take 2 tablets (650 mg total) by mouth every 6 (six) hours as needed for mild pain, fever or headache (or Fever >/= 101).    ? apixaban (ELIQUIS) 5 MG TABS tablet TAKE ONE TABLET ($RemoveBef'5MG'WOJyrjhOyo$  TOTAL) BY MOUTH TWOTIMES DAILY (Patient taking differently: Take 5 mg by mouth 2 (two) times daily.) 60 tablet 5  ? blood glucose meter kit and supplies KIT Dispense based on patient and insurance preference. Use up to four times daily as directed. 1 each  0  ? blood glucose meter kit and supplies KIT Dispense based on patient and insurance preference. Use up to four times daily as directed. 1 each 0  ? diltiazem (CARDIZEM CD) 240 MG 24 hr capsule Take 1 capsule (240 mg total) by mouth daily. 30 capsule 2  ?  feeding supplement (ENSURE ENLIVE / ENSURE PLUS) LIQD Take 237 mLs by mouth 3 (three) times daily between meals. 237 mL 12  ? Insulin Aspart FlexPen (NOVOLOG) 100 UNIT/ML Sugar 121-150--2 units; 151-200--3 units; 201-250--5 units; 251-300--8 units; 301-350--11units; 351-400--15 units Dispense every 6 hours 15 mL 1  ? Insulin Pen Needle 31G X 4 MM MISC Use with insulin pen to dispense insulin as directed 100 each 1  ? Insulin Pen Needle 31G X 4 MM MISC Use with insulin pen to dispense insulin as directed 100 each 1  ? polyethylene glycol (MIRALAX / GLYCOLAX) 17 g packet Take 17 g by mouth daily. 30 each 2  ? tamsulosin (FLOMAX) 0.4 MG CAPS capsule Take 2 capsules (0.8 mg total) by mouth every evening. 60 capsule 2  ? brimonidine (ALPHAGAN) 0.2 % ophthalmic solution Place 1 drop into the right eye 2 (two) times daily. (Patient not taking: Reported on 01/10/2022)    ? furosemide (LASIX) 40 MG tablet Take 1 tablet (40 mg total) by mouth every other day. (Patient not taking: Reported on 01/10/2022) 15 tablet 0  ? pantoprazole (PROTONIX) 40 MG tablet Take 1 tablet (40 mg total) by mouth 2 (two) times daily. 60 tablet 5  ? potassium chloride (KLOR-CON M) 10 MEQ tablet Take 1 tablet (10 mEq total) by mouth every other day. (Patient not taking: Reported on 01/10/2022) 15 tablet 0  ? ?No current facility-administered medications for this visit.  ? ? ?Allergies as of 01/10/2022  ? (No Known Allergies)  ? ? ?Family History  ?Problem Relation Age of Onset  ? Hypertension Mother   ? Heart attack Father   ? Hyperlipidemia Sister   ? Hyperlipidemia Brother   ? Colon polyps Brother   ? Hypertension Brother   ? Hypertension Sister   ? Diabetes Brother   ? Stroke Other   ? Diabetes Other   ? Hypertension Other   ? Hyperlipidemia Other   ? Colon cancer Neg Hx   ? ? ?Social History  ? ?Socioeconomic History  ? Marital status: Married  ?  Spouse name: Not on file  ? Number of children: Not on file  ? Years of education: Not on file  ?  Highest education level: Not on file  ?Occupational History  ? Occupation: Full time  ?Tobacco Use  ? Smoking status: Former  ?  Packs/day: 0.50  ?  Years: 15.00  ?  Pack years: 7.50  ?  Types: Cigarettes  ?  Start date: 05/28/1967  ?  Quit date: 05/27/1984  ?  Years since quitting: 37.6  ? Smokeless tobacco: Never  ?Vaping Use  ? Vaping Use: Never used  ?Substance and Sexual Activity  ? Alcohol use: Not Currently  ?  Alcohol/week: 14.0 standard drinks  ?  Types: 14 Glasses of wine per week  ?  Comment: hx of 1 or 2 glasses of wine at night; none currently  ? Drug use: No  ? Sexual activity: Not on file  ?Other Topics Concern  ? Not on file  ?Social History Narrative  ? Married-2 KIDS(AGE 52 AND 38).  ? No regular exercise  ? ?Social Determinants of Health  ? ?Emergency planning/management officer  Strain: Not on file  ?Food Insecurity: Not on file  ?Transportation Needs: Not on file  ?Physical Activity: Not on file  ?Stress: Not on file  ?Social Connections: Not on file  ? ? ?Subjective: ?Review of Systems  ?Constitutional:  Negative for chills and fever.  ?HENT:  Negative for congestion and hearing loss.   ?Eyes:  Negative for blurred vision and double vision.  ?Respiratory:  Negative for cough and shortness of breath.   ?Cardiovascular:  Negative for chest pain and palpitations.  ?Gastrointestinal:  Positive for abdominal pain. Negative for blood in stool, constipation, diarrhea, heartburn, melena and vomiting.  ?Genitourinary:  Negative for dysuria and urgency.  ?Musculoskeletal:  Negative for joint pain and myalgias.  ?Skin:  Negative for itching and rash.  ?Neurological:  Negative for dizziness and headaches.  ?Psychiatric/Behavioral:  Negative for depression. The patient is not nervous/anxious.   ? ? ?Objective: ?BP 120/74   Pulse 79   Temp 97.7 ?F (36.5 ?C)   Ht $R'5\' 10"'Vo$  (1.778 m)   Wt 233 lb (105.7 kg)   BMI 33.43 kg/m?  ?Physical Exam ?Constitutional:   ?   Appearance: Normal appearance.  ?HENT:  ?   Head: Normocephalic and  atraumatic.  ?Eyes:  ?   Extraocular Movements: Extraocular movements intact.  ?   Conjunctiva/sclera: Conjunctivae normal.  ?Cardiovascular:  ?   Rate and Rhythm: Normal rate and regular rhythm.  ?Pulmonary:  ?

## 2022-01-11 DIAGNOSIS — K429 Umbilical hernia without obstruction or gangrene: Secondary | ICD-10-CM | POA: Diagnosis not present

## 2022-01-11 DIAGNOSIS — Z9981 Dependence on supplemental oxygen: Secondary | ICD-10-CM | POA: Diagnosis not present

## 2022-01-11 DIAGNOSIS — Z87891 Personal history of nicotine dependence: Secondary | ICD-10-CM | POA: Diagnosis not present

## 2022-01-11 DIAGNOSIS — G8929 Other chronic pain: Secondary | ICD-10-CM | POA: Diagnosis not present

## 2022-01-11 DIAGNOSIS — E669 Obesity, unspecified: Secondary | ICD-10-CM | POA: Diagnosis not present

## 2022-01-11 DIAGNOSIS — K5730 Diverticulosis of large intestine without perforation or abscess without bleeding: Secondary | ICD-10-CM | POA: Diagnosis not present

## 2022-01-11 DIAGNOSIS — E782 Mixed hyperlipidemia: Secondary | ICD-10-CM | POA: Diagnosis not present

## 2022-01-11 DIAGNOSIS — I4892 Unspecified atrial flutter: Secondary | ICD-10-CM | POA: Diagnosis not present

## 2022-01-11 DIAGNOSIS — Z9181 History of falling: Secondary | ICD-10-CM | POA: Diagnosis not present

## 2022-01-11 DIAGNOSIS — R561 Post traumatic seizures: Secondary | ICD-10-CM | POA: Diagnosis not present

## 2022-01-11 DIAGNOSIS — I700 Atherosclerosis of aorta: Secondary | ICD-10-CM | POA: Diagnosis not present

## 2022-01-11 DIAGNOSIS — K311 Adult hypertrophic pyloric stenosis: Secondary | ICD-10-CM | POA: Diagnosis not present

## 2022-01-11 DIAGNOSIS — M549 Dorsalgia, unspecified: Secondary | ICD-10-CM | POA: Diagnosis not present

## 2022-01-11 DIAGNOSIS — D62 Acute posthemorrhagic anemia: Secondary | ICD-10-CM | POA: Diagnosis not present

## 2022-01-11 DIAGNOSIS — K219 Gastro-esophageal reflux disease without esophagitis: Secondary | ICD-10-CM | POA: Diagnosis not present

## 2022-01-11 DIAGNOSIS — Z7901 Long term (current) use of anticoagulants: Secondary | ICD-10-CM | POA: Diagnosis not present

## 2022-01-11 DIAGNOSIS — Z452 Encounter for adjustment and management of vascular access device: Secondary | ICD-10-CM | POA: Diagnosis not present

## 2022-01-11 DIAGNOSIS — Z6833 Body mass index (BMI) 33.0-33.9, adult: Secondary | ICD-10-CM | POA: Diagnosis not present

## 2022-01-11 DIAGNOSIS — I4821 Permanent atrial fibrillation: Secondary | ICD-10-CM | POA: Diagnosis not present

## 2022-01-11 DIAGNOSIS — G4733 Obstructive sleep apnea (adult) (pediatric): Secondary | ICD-10-CM | POA: Diagnosis not present

## 2022-01-11 DIAGNOSIS — Z794 Long term (current) use of insulin: Secondary | ICD-10-CM | POA: Diagnosis not present

## 2022-01-11 DIAGNOSIS — K861 Other chronic pancreatitis: Secondary | ICD-10-CM | POA: Diagnosis not present

## 2022-01-11 DIAGNOSIS — I10 Essential (primary) hypertension: Secondary | ICD-10-CM | POA: Diagnosis not present

## 2022-01-11 LAB — BUN+CREAT
BUN/Creatinine Ratio: 32 — ABNORMAL HIGH (ref 10–24)
BUN: 25 mg/dL (ref 8–27)
Creatinine, Ser: 0.77 mg/dL (ref 0.76–1.27)
eGFR: 95 mL/min/{1.73_m2} (ref >59)

## 2022-01-11 NOTE — Telephone Encounter (Signed)
noted 

## 2022-01-12 DIAGNOSIS — I480 Paroxysmal atrial fibrillation: Secondary | ICD-10-CM | POA: Diagnosis not present

## 2022-01-12 DIAGNOSIS — I10 Essential (primary) hypertension: Secondary | ICD-10-CM | POA: Diagnosis not present

## 2022-01-14 DIAGNOSIS — R7301 Impaired fasting glucose: Secondary | ICD-10-CM | POA: Diagnosis not present

## 2022-01-14 DIAGNOSIS — I10 Essential (primary) hypertension: Secondary | ICD-10-CM | POA: Diagnosis not present

## 2022-01-16 ENCOUNTER — Encounter (HOSPITAL_COMMUNITY): Payer: Self-pay | Admitting: Radiology

## 2022-01-16 ENCOUNTER — Ambulatory Visit (HOSPITAL_COMMUNITY)
Admission: RE | Admit: 2022-01-16 | Discharge: 2022-01-16 | Disposition: A | Discharge status: Home / Self Care | Payer: PPO | Source: Ambulatory Visit | Attending: Internal Medicine | Admitting: Internal Medicine

## 2022-01-16 DIAGNOSIS — K8511 Biliary acute pancreatitis with uninfected necrosis: Secondary | ICD-10-CM | POA: Diagnosis not present

## 2022-01-16 DIAGNOSIS — I871 Compression of vein: Secondary | ICD-10-CM | POA: Diagnosis not present

## 2022-01-16 DIAGNOSIS — K5730 Diverticulosis of large intestine without perforation or abscess without bleeding: Secondary | ICD-10-CM | POA: Diagnosis not present

## 2022-01-16 DIAGNOSIS — K429 Umbilical hernia without obstruction or gangrene: Secondary | ICD-10-CM | POA: Diagnosis not present

## 2022-01-16 DIAGNOSIS — K859 Acute pancreatitis without necrosis or infection, unspecified: Secondary | ICD-10-CM | POA: Diagnosis not present

## 2022-01-16 MED ORDER — IOHEXOL 300 MG/ML  SOLN
100.0000 mL | Freq: Once | INTRAMUSCULAR | Status: AC | PRN
  Administered 2022-01-16: 100 mL via INTRAVENOUS

## 2022-01-17 DIAGNOSIS — K861 Other chronic pancreatitis: Secondary | ICD-10-CM | POA: Diagnosis not present

## 2022-01-17 DIAGNOSIS — K922 Gastrointestinal hemorrhage, unspecified: Secondary | ICD-10-CM | POA: Diagnosis not present

## 2022-01-17 DIAGNOSIS — K8591 Acute pancreatitis with uninfected necrosis, unspecified: Secondary | ICD-10-CM | POA: Diagnosis not present

## 2022-01-17 DIAGNOSIS — R112 Nausea with vomiting, unspecified: Secondary | ICD-10-CM | POA: Diagnosis not present

## 2022-01-17 DIAGNOSIS — K311 Adult hypertrophic pyloric stenosis: Secondary | ICD-10-CM | POA: Diagnosis not present

## 2022-01-17 NOTE — Telephone Encounter (Signed)
Dr Abbey Chatters ?Sherre Lain sent over prescribers orders  needing to be signed and faxed back. Will put on your desk ?

## 2022-01-18 DIAGNOSIS — Z6833 Body mass index (BMI) 33.0-33.9, adult: Secondary | ICD-10-CM | POA: Diagnosis not present

## 2022-01-18 DIAGNOSIS — Z794 Long term (current) use of insulin: Secondary | ICD-10-CM | POA: Diagnosis not present

## 2022-01-18 DIAGNOSIS — K861 Other chronic pancreatitis: Secondary | ICD-10-CM | POA: Diagnosis not present

## 2022-01-18 DIAGNOSIS — Z9181 History of falling: Secondary | ICD-10-CM | POA: Diagnosis not present

## 2022-01-18 DIAGNOSIS — D62 Acute posthemorrhagic anemia: Secondary | ICD-10-CM | POA: Diagnosis not present

## 2022-01-18 DIAGNOSIS — D649 Anemia, unspecified: Secondary | ICD-10-CM | POA: Diagnosis not present

## 2022-01-18 DIAGNOSIS — I4821 Permanent atrial fibrillation: Secondary | ICD-10-CM | POA: Diagnosis not present

## 2022-01-18 DIAGNOSIS — E669 Obesity, unspecified: Secondary | ICD-10-CM | POA: Diagnosis not present

## 2022-01-18 DIAGNOSIS — M549 Dorsalgia, unspecified: Secondary | ICD-10-CM | POA: Diagnosis not present

## 2022-01-18 DIAGNOSIS — Z9981 Dependence on supplemental oxygen: Secondary | ICD-10-CM | POA: Diagnosis not present

## 2022-01-18 DIAGNOSIS — H409 Unspecified glaucoma: Secondary | ICD-10-CM | POA: Diagnosis not present

## 2022-01-18 DIAGNOSIS — E782 Mixed hyperlipidemia: Secondary | ICD-10-CM | POA: Diagnosis not present

## 2022-01-18 DIAGNOSIS — Z7901 Long term (current) use of anticoagulants: Secondary | ICD-10-CM | POA: Diagnosis not present

## 2022-01-18 DIAGNOSIS — I10 Essential (primary) hypertension: Secondary | ICD-10-CM | POA: Diagnosis not present

## 2022-01-18 DIAGNOSIS — K5730 Diverticulosis of large intestine without perforation or abscess without bleeding: Secondary | ICD-10-CM | POA: Diagnosis not present

## 2022-01-18 DIAGNOSIS — K311 Adult hypertrophic pyloric stenosis: Secondary | ICD-10-CM | POA: Diagnosis not present

## 2022-01-18 DIAGNOSIS — G8929 Other chronic pain: Secondary | ICD-10-CM | POA: Diagnosis not present

## 2022-01-18 DIAGNOSIS — Z87891 Personal history of nicotine dependence: Secondary | ICD-10-CM | POA: Diagnosis not present

## 2022-01-18 DIAGNOSIS — I700 Atherosclerosis of aorta: Secondary | ICD-10-CM | POA: Diagnosis not present

## 2022-01-18 DIAGNOSIS — H18221 Idiopathic corneal edema, right eye: Secondary | ICD-10-CM | POA: Diagnosis not present

## 2022-01-18 DIAGNOSIS — R7303 Prediabetes: Secondary | ICD-10-CM | POA: Diagnosis not present

## 2022-01-18 DIAGNOSIS — Z125 Encounter for screening for malignant neoplasm of prostate: Secondary | ICD-10-CM | POA: Diagnosis not present

## 2022-01-18 DIAGNOSIS — K8501 Idiopathic acute pancreatitis with uninfected necrosis: Secondary | ICD-10-CM | POA: Diagnosis not present

## 2022-01-18 DIAGNOSIS — K219 Gastro-esophageal reflux disease without esophagitis: Secondary | ICD-10-CM | POA: Diagnosis not present

## 2022-01-18 DIAGNOSIS — G4733 Obstructive sleep apnea (adult) (pediatric): Secondary | ICD-10-CM | POA: Diagnosis not present

## 2022-01-18 DIAGNOSIS — R3915 Urgency of urination: Secondary | ICD-10-CM | POA: Diagnosis not present

## 2022-01-18 DIAGNOSIS — K429 Umbilical hernia without obstruction or gangrene: Secondary | ICD-10-CM | POA: Diagnosis not present

## 2022-01-18 DIAGNOSIS — I4892 Unspecified atrial flutter: Secondary | ICD-10-CM | POA: Diagnosis not present

## 2022-01-18 DIAGNOSIS — Z452 Encounter for adjustment and management of vascular access device: Secondary | ICD-10-CM | POA: Diagnosis not present

## 2022-01-18 DIAGNOSIS — I482 Chronic atrial fibrillation, unspecified: Secondary | ICD-10-CM | POA: Diagnosis not present

## 2022-01-21 ENCOUNTER — Telehealth: Payer: Self-pay

## 2022-01-21 NOTE — Telephone Encounter (Signed)
Dr. Abbey Chatters, ?The pt's blood work is on [place], drilling. ? ? ?Thanks ?Dena ?

## 2022-01-24 ENCOUNTER — Telehealth: Payer: Self-pay | Admitting: Internal Medicine

## 2022-01-24 NOTE — Telephone Encounter (Signed)
Prescriber orders faxed back to [facility]. Confirmation received ?

## 2022-01-24 NOTE — Telephone Encounter (Signed)
Please arrange follow-up visit with me in 2 months.  Okay to use one of my urgent spots.  Thank you ?

## 2022-01-25 DIAGNOSIS — E669 Obesity, unspecified: Secondary | ICD-10-CM | POA: Diagnosis not present

## 2022-01-25 DIAGNOSIS — Z7901 Long term (current) use of anticoagulants: Secondary | ICD-10-CM | POA: Diagnosis not present

## 2022-01-25 DIAGNOSIS — K861 Other chronic pancreatitis: Secondary | ICD-10-CM | POA: Diagnosis not present

## 2022-01-25 DIAGNOSIS — Z452 Encounter for adjustment and management of vascular access device: Secondary | ICD-10-CM | POA: Diagnosis not present

## 2022-01-25 DIAGNOSIS — Z9981 Dependence on supplemental oxygen: Secondary | ICD-10-CM | POA: Diagnosis not present

## 2022-01-25 DIAGNOSIS — E782 Mixed hyperlipidemia: Secondary | ICD-10-CM | POA: Diagnosis not present

## 2022-01-25 DIAGNOSIS — K219 Gastro-esophageal reflux disease without esophagitis: Secondary | ICD-10-CM | POA: Diagnosis not present

## 2022-01-25 DIAGNOSIS — M549 Dorsalgia, unspecified: Secondary | ICD-10-CM | POA: Diagnosis not present

## 2022-01-25 DIAGNOSIS — Z9181 History of falling: Secondary | ICD-10-CM | POA: Diagnosis not present

## 2022-01-25 DIAGNOSIS — I700 Atherosclerosis of aorta: Secondary | ICD-10-CM | POA: Diagnosis not present

## 2022-01-25 DIAGNOSIS — I4821 Permanent atrial fibrillation: Secondary | ICD-10-CM | POA: Diagnosis not present

## 2022-01-25 DIAGNOSIS — Z87891 Personal history of nicotine dependence: Secondary | ICD-10-CM | POA: Diagnosis not present

## 2022-01-25 DIAGNOSIS — K311 Adult hypertrophic pyloric stenosis: Secondary | ICD-10-CM | POA: Diagnosis not present

## 2022-01-25 DIAGNOSIS — I4892 Unspecified atrial flutter: Secondary | ICD-10-CM | POA: Diagnosis not present

## 2022-01-25 DIAGNOSIS — I10 Essential (primary) hypertension: Secondary | ICD-10-CM | POA: Diagnosis not present

## 2022-01-25 DIAGNOSIS — Z6833 Body mass index (BMI) 33.0-33.9, adult: Secondary | ICD-10-CM | POA: Diagnosis not present

## 2022-01-25 DIAGNOSIS — K429 Umbilical hernia without obstruction or gangrene: Secondary | ICD-10-CM | POA: Diagnosis not present

## 2022-01-25 DIAGNOSIS — G4733 Obstructive sleep apnea (adult) (pediatric): Secondary | ICD-10-CM | POA: Diagnosis not present

## 2022-01-25 DIAGNOSIS — Z794 Long term (current) use of insulin: Secondary | ICD-10-CM | POA: Diagnosis not present

## 2022-01-25 DIAGNOSIS — K5730 Diverticulosis of large intestine without perforation or abscess without bleeding: Secondary | ICD-10-CM | POA: Diagnosis not present

## 2022-01-25 DIAGNOSIS — G8929 Other chronic pain: Secondary | ICD-10-CM | POA: Diagnosis not present

## 2022-01-25 DIAGNOSIS — D62 Acute posthemorrhagic anemia: Secondary | ICD-10-CM | POA: Diagnosis not present

## 2022-02-25 DIAGNOSIS — H4051X0 Glaucoma secondary to other eye disorders, right eye, stage unspecified: Secondary | ICD-10-CM | POA: Diagnosis not present

## 2022-02-25 DIAGNOSIS — H21261 Iris atrophy (essential) (progressive), right eye: Secondary | ICD-10-CM | POA: Diagnosis not present

## 2022-02-25 DIAGNOSIS — H1820 Unspecified corneal edema: Secondary | ICD-10-CM | POA: Diagnosis not present

## 2022-02-28 NOTE — Progress Notes (Signed)
Cardiology Office Note    Date:  03/01/2022   ID:  Sean Golden 02/17/1946, MRN [id number]  PCP:  Celene Squibb, MD  Cardiologist: Rozann Lesches, MD    Chief Complaint  Patient presents with   Follow-up    6 month visit    History of Present Illness:    Sean Golden is a 76 y.o. male with past medical history of permanent atrial fibrillation, HTN, OSA and GERD who presents to the office today for overdue 43-month follow-up.  He was last examined by Dr. Domenic Polite in 03/2021 and reported generalized fatigue but no chest pain or palpitations. Rates were well controlled and he was continued on Cardizem CD 240 mg in AM/120 mg in PM along with Eliquis 5 mg twice daily for anticoagulation.  In the interim, he was admitted twice to [facility] in 10/2021 for acute necrotizing pancreatitis and was treated with antibiotic therapy during admission. Also evaluated for hematemesis at that time and Eliquis was temporarily held. Was found to have duodenitis and a nonbleeding duodenal ulcer. He had a recurrent admission in 11/2021 for pancreatitis and was followed closely by GI during admission. Symptoms improved with IV fluids along with pain management. He did have gastric outlet obstruction as well and underwent EGD which showed diffuse severe inflammation and edema in the duodenal bulb and the lumen was narrowed secondary to inflammation. He ended up requiring PICC line placement and was started on TPN prior to discharge.  In talking to the patient today, he reports overall doing well from a cardiac perspective since his last office visit. He did feel fatigued following his recent hospital admissions and is trying to gradually increase his activity level at home. He denies any recent chest pain or palpitations. No recent dyspnea on exertion, orthopnea, PND or pitting edema. Was on Lasix temporarily following his admission but has since completed this. He has also been able to resume a normal diet  and is no longer on TPN since 01/2022.    Past Medical History:  Diagnosis Date   Atrial fibrillation ([place]) 05/2014   Essential hypertension    GERD (gastroesophageal reflux disease)    Helicobacter pylori gastritis 08/2014   Treated with Pylera   Pancreatitis    Sleep apnea     Past Surgical History:  Procedure Laterality Date   CARDIAC ELECTROPHYSIOLOGY STUDY AND ABLATION  2010?   COLONOSCOPY  2005   SOLITARY RECTAL ULCER   COLONOSCOPY N/A 09/06/2014   Dr. Oneida Alar: three colon polyps, moderate diverticulosis throughout the entire examined colon   ESOPHAGOGASTRODUODENOSCOPY N/A 09/06/2014   Dr. Fields:moderate erosive gastritis, no Barrett's. +H.pylori gastritis, treated with Pylera   ESOPHAGOGASTRODUODENOSCOPY (EGD) WITH PROPOFOL N/A 11/17/2021   Procedure: ESOPHAGOGASTRODUODENOSCOPY (EGD) WITH PROPOFOL;  Surgeon: Rogene Houston, MD;  [facility]: [facility];  Service: Endoscopy;  Laterality: N/A;   ESOPHAGOGASTRODUODENOSCOPY (EGD) WITH PROPOFOL N/A 11/27/2021   Procedure: ESOPHAGOGASTRODUODENOSCOPY (EGD) WITH PROPOFOL;  Surgeon: Harvel Quale, MD;  Location: [facility];  Service: Gastroenterology;  Laterality: N/A;   HEMORRHOID BANDING N/A 09/06/2014   NO BANDING PERFORMED. NO INTERNAL HEMORRHOIDS IDENTIFIED    UPPER GASTROINTESTINAL ENDOSCOPY  2005 [facility]   VASECTOMY      Current Medications: Outpatient Medications Prior to Visit  Medication Sig Dispense Refill   acetaminophen (TYLENOL) 325 MG tablet Take 2 tablets (650 mg total) by mouth every 6 (six) hours as needed for mild pain, fever or headache (or Fever >/= 101).  blood glucose meter kit and supplies KIT Dispense based on patient and insurance preference. Use up to four times daily as directed. 1 each 0   blood glucose meter kit and supplies KIT Dispense based on patient and insurance preference. Use up to four times daily as directed. 1 each 0   feeding supplement (ENSURE ENLIVE / ENSURE PLUS) LIQD Take  237 mLs by mouth 3 (three) times daily between meals. 237 mL 12   Insulin Aspart FlexPen (NOVOLOG) 100 UNIT/ML Sugar 121-150--2 units; 151-200--3 units; 201-250--5 units; 251-300--8 units; 301-350--11units; 351-400--15 units Dispense every 6 hours 15 mL 1   Insulin Pen Needle 31G X 4 MM MISC Use with insulin pen to dispense insulin as directed 100 each 1   Insulin Pen Needle 31G X 4 MM MISC Use with insulin pen to dispense insulin as directed 100 each 1   pantoprazole (PROTONIX) 40 MG tablet Take 1 tablet (40 mg total) by mouth 2 (two) times daily. 60 tablet 5   tamsulosin (FLOMAX) 0.4 MG CAPS capsule Take 2 capsules (0.8 mg total) by mouth every evening. 60 capsule 2   apixaban (ELIQUIS) 5 MG TABS tablet TAKE ONE TABLET ($RemoveBef'5MG'HFUNkLNLXf$  TOTAL) BY MOUTH TWOTIMES DAILY (Patient taking differently: Take 5 mg by mouth 2 (two) times daily.) 60 tablet 5   diltiazem (CARDIZEM CD) 240 MG 24 hr capsule Take 1 capsule (240 mg total) by mouth daily. 30 capsule 2   furosemide (LASIX) 40 MG tablet Take 1 tablet (40 mg total) by mouth every other day. 15 tablet 0   brimonidine (ALPHAGAN) 0.2 % ophthalmic solution Place 1 drop into the right eye 2 (two) times daily. (Patient not taking: Reported on 03/01/2022)     polyethylene glycol (MIRALAX / GLYCOLAX) 17 g packet Take 17 g by mouth daily. (Patient not taking: Reported on 03/01/2022) 30 each 2   potassium chloride (KLOR-CON M) 10 MEQ tablet Take 1 tablet (10 mEq total) by mouth every other day. (Patient not taking: Reported on 03/01/2022) 15 tablet 0   No facility-administered medications prior to visit.     Allergies:   Patient has no known allergies.   Social History   Socioeconomic History   Marital status: Married    Spouse name: Not on file   Number of children: Not on file   Years of education: Not on file   Highest education level: Not on file  Occupational History   Occupation: Full time  Tobacco Use   Smoking status: Former    Packs/day: 0.50    Years:  15.00    Total pack years: 7.50    Types: Cigarettes    Start date: 05/28/1967    Quit date: 05/27/1984    Years since quitting: 37.7   Smokeless tobacco: Never  Vaping Use   Vaping Use: Never used  Substance and Sexual Activity   Alcohol use: Not Currently    Alcohol/week: 14.0 standard drinks of alcohol    Types: 14 Glasses of wine per week    Comment: hx of 1 or 2 glasses of wine at night; none currently   Drug use: No   Sexual activity: Not on file  Other Topics Concern   Not on file  Social History Narrative   Married-2 KIDS(AGE 55 AND 38).   No regular exercise   Social Determinants of Health   Financial Resource Strain: Not on file  Food Insecurity: Not on file  Transportation Needs: Not on file  Physical Activity: Not on file  Stress: Not on file  Social Connections: Not on file     Family History:  The patient's family history includes Colon polyps in his brother; Diabetes in his brother and another family member; Heart attack in his father; Hyperlipidemia in his brother, sister, and another family member; Hypertension in his brother, mother, sister, and another family member; Stroke in an other family member.   Review of Systems:    Please see the history of present illness.     All other systems reviewed and are otherwise negative except as noted above.   Physical Exam:    VS:  BP 122/70 (BP Location: Left Arm, Patient Position: Sitting, Cuff Size: Large)   Pulse 75   Resp 20   Ht $R'5\' 10"'CA$  (1.778 m)   Wt 227 lb 12.8 oz (103.3 kg)   SpO2 96%   BMI 32.69 kg/m    General: Well developed, well nourished,male appearing in no acute distress. Head: Normocephalic, atraumatic. Neck: No carotid bruits. JVD not elevated.  Lungs: Respirations regular and unlabored, without wheezes or rales.  Heart: Irregularly irregular. No S3 or S4.  No murmur, no rubs, or gallops appreciated. Abdomen: Appears non-distended. No obvious abdominal masses. Msk:  Strength and tone  appear normal for age. No obvious joint deformities or effusions. Extremities: No clubbing or cyanosis. No pitting edema.  Distal pedal pulses are 2+ bilaterally. Neuro: Alert and oriented X 3. Moves all extremities spontaneously. No focal deficits noted. Psych:  Responds to questions appropriately with a normal affect. Skin: No rashes or lesions noted  Wt Readings from Last 3 Encounters:  03/01/22 227 lb 12.8 oz (103.3 kg)  01/10/22 233 lb (105.7 kg)  11/29/21 236 lb 12.4 oz (107.4 kg)    Studies/Labs Reviewed:   EKG:  EKG is not ordered today.   Recent Labs: 10/30/2021: B Natriuretic Peptide 47.0 12/01/2021: ALT 17 12/02/2021: Hemoglobin 9.6; Magnesium 1.8; Platelets 321; Potassium 3.8; Sodium 133 01/10/2022: BUN 25; Creatinine, Ser 0.77   Lipid Panel    Component Value Date/Time   CHOL 199 10/28/2021 0418   TRIG 89 11/30/2021 0536   HDL 41 10/28/2021 0418   CHOLHDL 4.9 10/28/2021 0418   VLDL 17 10/28/2021 0418   LDLCALC 141 (H) 10/28/2021 0418    Additional studies/ records that were reviewed today include:   NST: 08/2015 There was no ST segment deviation noted during stress. Defect 1: There is a small defect of mild severity present in the basal inferior and mid inferior location. The study is normal. This is a low risk study. The left ventricular ejection fraction is hyperdynamic (>65%).  Echo: 10/2021 IMPRESSIONS     1. Left ventricular ejection fraction, by estimation, is 60 to 65%. The  left ventricle has normal function. The left ventricle has no regional  wall motion abnormalities. There is moderate left ventricular hypertrophy.  Left ventricular diastolic  parameters are indeterminate.   2. Right ventricular systolic function is normal. The right ventricular  size is normal. There is normal pulmonary artery systolic pressure.   3. Left atrial size was mildly dilated.   4. Prominent epicardial adipose tissue.   5. The mitral valve is normal in structure. No  evidence of mitral valve  regurgitation. No evidence of mitral stenosis.   6. The aortic valve is tricuspid. There is mild calcification of the  aortic valve. Aortic valve regurgitation is not visualized. Aortic valve  sclerosis is present, with no evidence of aortic valve stenosis.   7. The inferior  vena cava is normal in size with greater than 50%  respiratory variability, suggesting right atrial pressure of 3 mmHg.   Assessment:    1. Permanent atrial fibrillation ([place])   2. Current use of long term anticoagulation   3. Essential hypertension   4. OSA (obstructive sleep apnea)   5. History of pancreatitis      Plan:   In order of problems listed above:  1. Permanent Atrial Fibrillation/Use of Long-term Anticoagulation - He denies any recent palpitations and HR is well-controlled in the 70's today. He remains on Cardizem CD 240 mg daily for rate control.  - On Eliquis 5 mg twice daily for anticoagulation with no reports of active bleeding. Hgb was stable at 9.6 when checked in 11/2021. He did have repeat labs with his PCP in the interim and we will request a copy of these.    2. HTN - BP is well-controlled at 122/70 during today's visit. Continue current medical therapy with Cardizem CD $RemoveBef'240mg'pQodvOcWtx$  daily.   3. OSA - He reports good compliance with his CPAP on a nightly basis.   4. Chronic Pancreatitis - He has been followed closely by GI for this and was also recently admitted for gastric outlet obstruction and required PICC placement and started on TPN in 11/2021. He is now no longer on TPN and has resumed a normal diet. No recurrent abdominal pain. Has eliminated fried foods and alcohol from his diet.     Medication Adjustments/Labs and Tests Ordered: Current medicines are reviewed at length with the patient today.  Concerns regarding medicines are outlined above.  Medication changes, Labs and Tests ordered today are listed in the Patient Instructions below. Patient Instructions   Medication Instructions:  Your physician recommends that you continue on your current medications as directed. Please refer to the Current Medication list given to you today.   *If you need a refill on your cardiac medications before your next appointment, please call your pharmacy*   Lab Work: NONE ordered at this time of appointment   If you have labs (blood work) drawn today and your tests are completely normal, you will receive your results only by: [place] (if you have MyChart) OR A paper copy in the mail If you have any lab test that is abnormal or we need to change your treatment, we will call you to review the results.   Testing/Procedures: NONE ordered at this time of appointment    Follow-Up: At [facility], you and your health needs are our priority.  As part of our continuing mission to provide you with exceptional heart care, we have created designated Provider Care Teams.  These Care Teams include your primary Cardiologist (physician) and Advanced Practice Providers (APPs -  Physician Assistants and Nurse Practitioners) who all work together to provide you with the care you need, when you need it.  We recommend signing up for the patient portal called "MyChart".  Sign up information is provided on this After Visit Summary.  MyChart is used to connect with patients for Virtual Visits (Telemedicine).  Patients are able to view lab/test results, encounter notes, upcoming appointments, etc.  Non-urgent messages can be sent to your provider as well.   To learn more about what you can do with MyChart, go to [email or web address].    Your next appointment:   1 year(s)  The format for your next appointment:   In Person  Provider:   Rozann Lesches, MD    Other Instructions  Important Information About Sugar         Signed, Erma Heritage, PA-C  03/01/2022 1:58 PM     [facility] [address] [place], [place]  [address] Phone: [phone] Fax: [phone]

## 2022-03-01 ENCOUNTER — Ambulatory Visit: Payer: PPO | Admitting: Student

## 2022-03-01 ENCOUNTER — Encounter: Payer: Self-pay | Admitting: Student

## 2022-03-01 VITALS — BP 122/70 | HR 75 | Resp 20 | Ht 70.0 in | Wt 227.8 lb

## 2022-03-01 DIAGNOSIS — G4733 Obstructive sleep apnea (adult) (pediatric): Secondary | ICD-10-CM | POA: Diagnosis not present

## 2022-03-01 DIAGNOSIS — I10 Essential (primary) hypertension: Secondary | ICD-10-CM

## 2022-03-01 DIAGNOSIS — I4821 Permanent atrial fibrillation: Secondary | ICD-10-CM | POA: Diagnosis not present

## 2022-03-01 DIAGNOSIS — Z7901 Long term (current) use of anticoagulants: Secondary | ICD-10-CM

## 2022-03-01 DIAGNOSIS — Z8719 Personal history of other diseases of the digestive system: Secondary | ICD-10-CM | POA: Diagnosis not present

## 2022-03-01 MED ORDER — APIXABAN 5 MG PO TABS: 5.0000 mg | ORAL_TABLET | Freq: Two times a day (BID) | ORAL | 11 refills | Status: DC

## 2022-03-01 MED ORDER — DILTIAZEM HCL ER COATED BEADS 240 MG PO CP24: 240.0000 mg | ORAL_CAPSULE | Freq: Every day | ORAL | 3 refills | Status: DC

## 2022-03-01 NOTE — Patient Instructions (Signed)
Medication Instructions:  Your physician recommends that you continue on your current medications as directed. Please refer to the Current Medication list given to you today.   *If you need a refill on your cardiac medications before your next appointment, please call your pharmacy*   Lab Work: NONE ordered at this time of appointment   If you have labs (blood work) drawn today and your tests are completely normal, you will receive your results only by: [place] (if you have MyChart) OR A paper copy in the mail If you have any lab test that is abnormal or we need to change your treatment, we will call you to review the results.   Testing/Procedures: NONE ordered at this time of appointment    Follow-Up: At [facility], you and your health needs are our priority.  As part of our continuing mission to provide you with exceptional heart care, we have created designated Provider Care Teams.  These Care Teams include your primary Cardiologist (physician) and Advanced Practice Providers (APPs -  Physician Assistants and Nurse Practitioners) who all work together to provide you with the care you need, when you need it.  We recommend signing up for the patient portal called "MyChart".  Sign up information is provided on this After Visit Summary.  MyChart is used to connect with patients for Virtual Visits (Telemedicine).  Patients are able to view lab/test results, encounter notes, upcoming appointments, etc.  Non-urgent messages can be sent to your provider as well.   To learn more about what you can do with MyChart, go to [email or web address].    Your next appointment:   1 year(s)  The format for your next appointment:   In Person  Provider:   Rozann Lesches, MD    Other Instructions   Important Information About Sugar

## 2022-03-19 ENCOUNTER — Encounter: Payer: Self-pay | Admitting: Internal Medicine

## 2022-04-16 IMAGING — MR MR HEAD WO/W CM
7 of 13 series · 21 of 48 positions shown · IV contrast (gadavist)
Comparison: None.

CLINICAL DATA: Binocular vision loss

EXAM:
MRI HEAD AND ORBITS WITHOUT AND WITH CONTRAST
TECHNIQUE: Multiplanar, multiecho pulse sequences of the brain and surrounding
structures were obtained without and with intravenous contrast.
Multiplanar, multiecho pulse sequences of the orbits and surrounding
structures were obtained including fat saturation techniques, before
and after intravenous contrast administration.
CONTRAST:  10mL GADAVIST GADOBUTROL 1 MMOL/ML IV SOLN

[Series 2: DWI · axial · 3.0mm · 0.94mm/px · z∈[-57,+107]mm · 6 of 112 slices shown (1 of 2)]
[im 1/112]
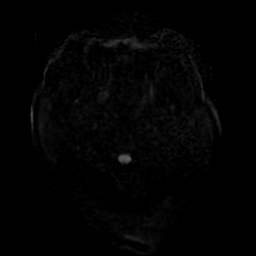
[im 23/112]
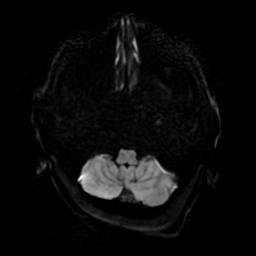
[im 45/112]
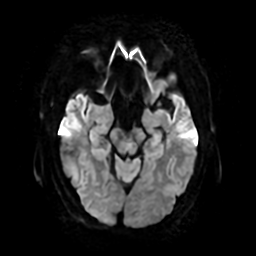
[im 67/112]
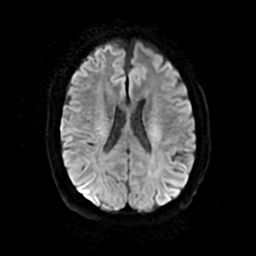
[im 89/112]
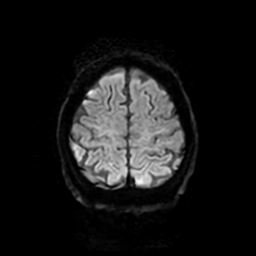
[im 112/112]
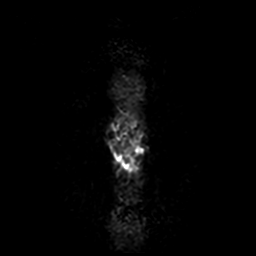

[Series 3: DWI · coronal · 4.0mm · 0.94mm/px · 5 of 75 slices shown (2 of 2)]
[im 1/75]
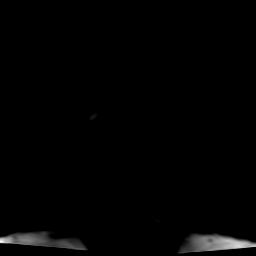
[im 19/75]
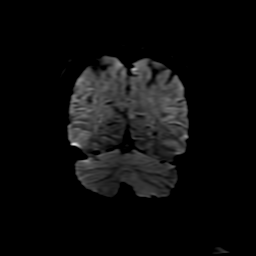
[im 38/75]
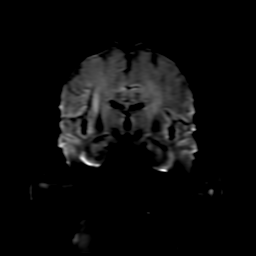
[im 56/75]
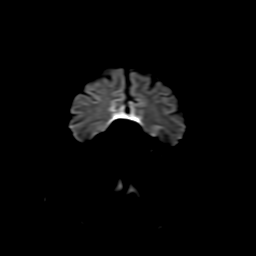
[im 75/75]
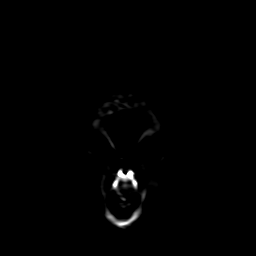

[Series 4: FLAIR · sagittal · 5.0mm · 0.23mm/px · 2 of 28 slices shown (1 of 2)]
[im 1/28]
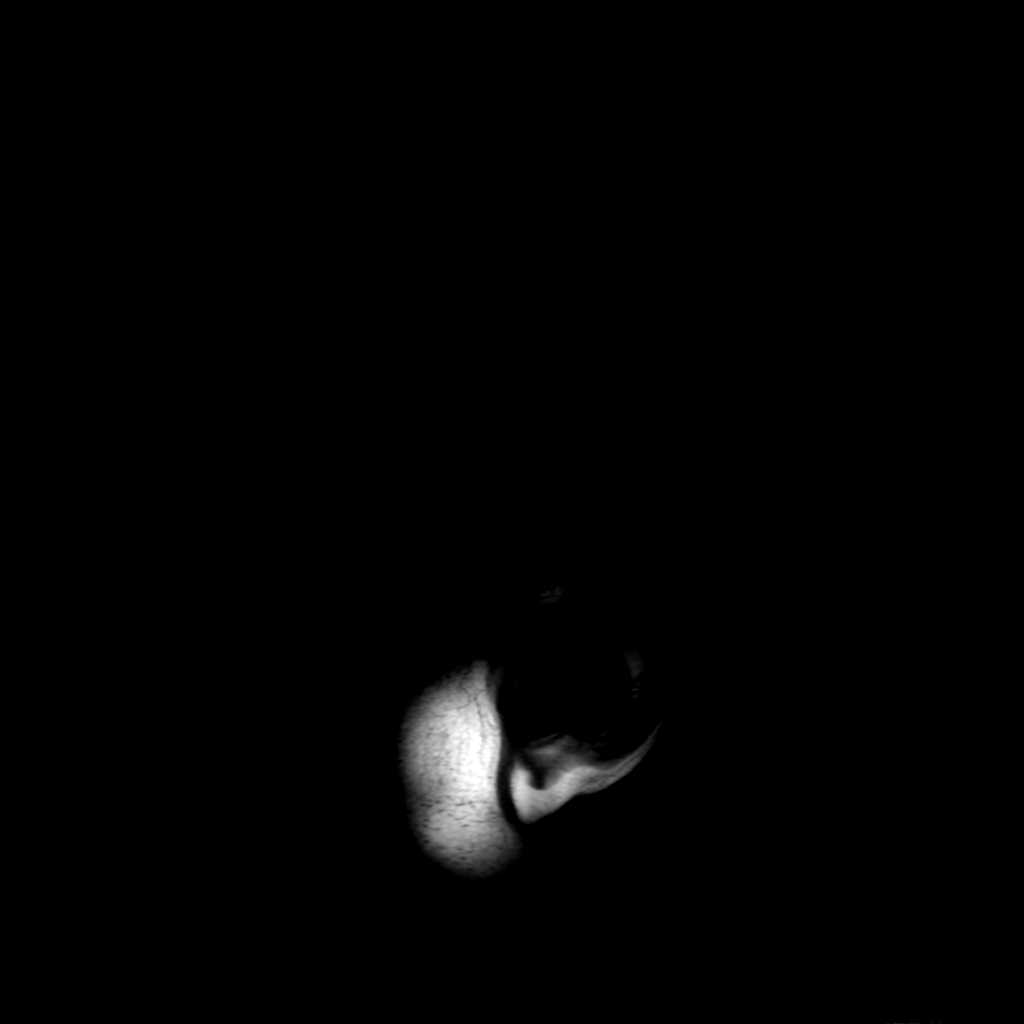
[im 28/28]
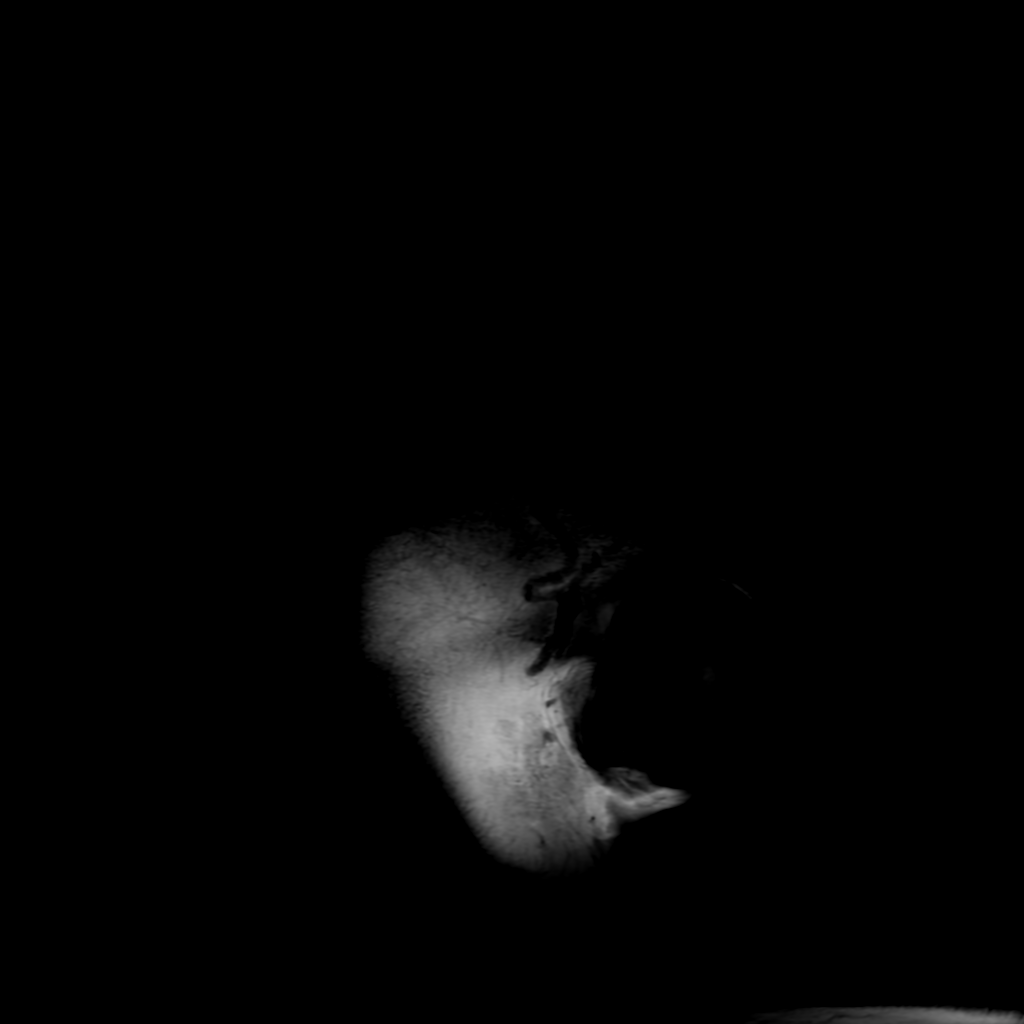

[Series 5: T2 · axial · 5.0mm · 0.23mm/px · 1 of 28 slices shown]
[im 1/28]
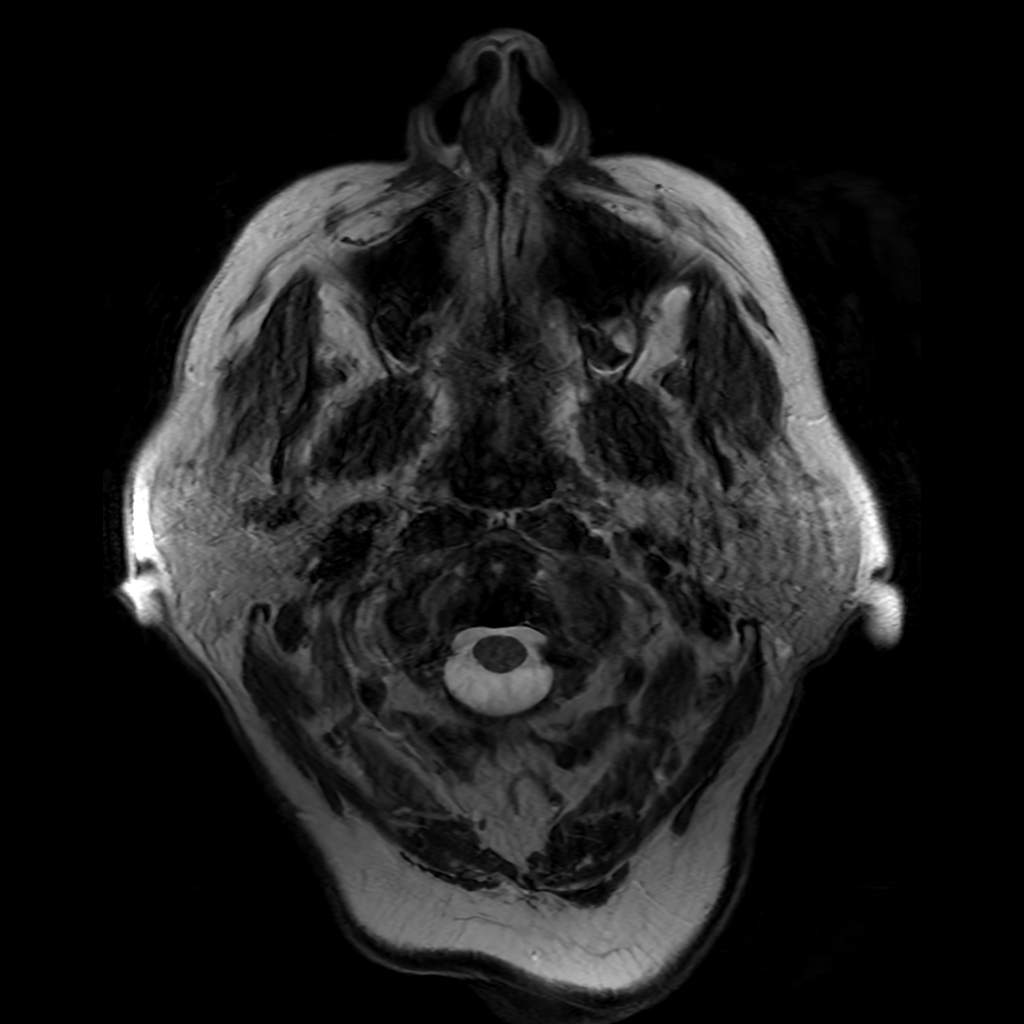

[Series 6: FLAIR · axial · 4.0mm · 0.45mm/px · z∈[-45,+112]mm · 2 of 37 slices shown (2 of 2)]
[im 1/37]
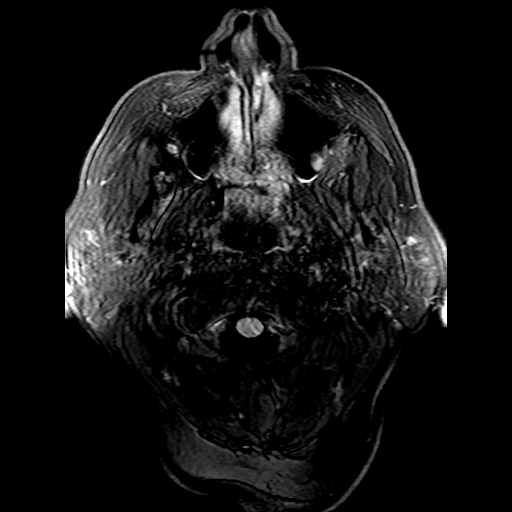
[im 37/37]
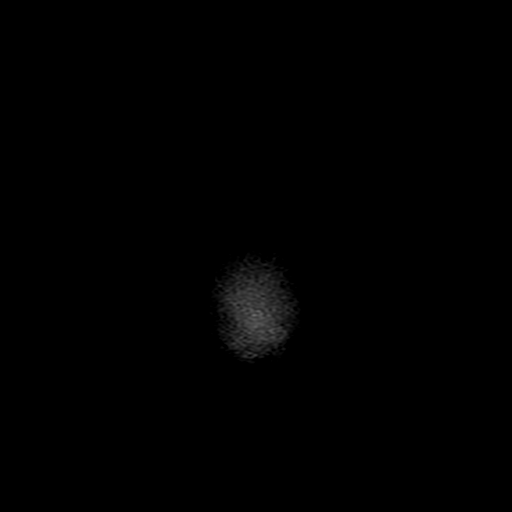

[Series 250: ADC · axial · 3.0mm · 0.94mm/px · z∈[-57,+107]mm · 3 of 54 slices shown (1 of 2)]
[im 1/54]
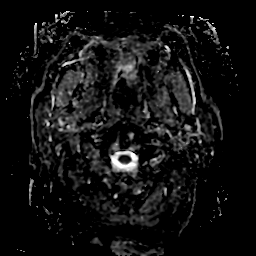
[im 27/54]
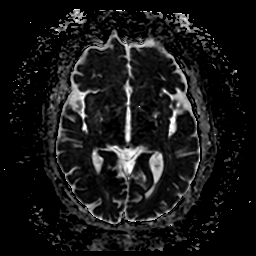
[im 54/54]
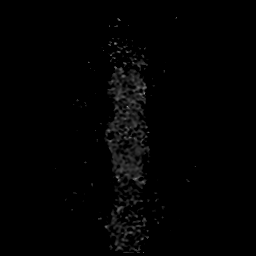

[Series 350: ADC · coronal · 4.0mm · 0.94mm/px · 2 of 37 slices shown (2 of 2)]
[im 1/37]
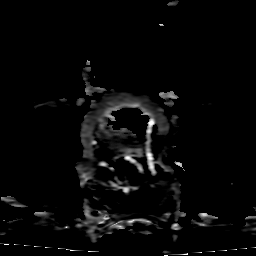
[im 37/37]
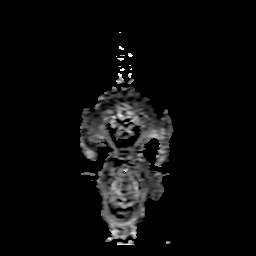

[21 of 48 positions shown; findings below may reference images not displayed]

FINDINGS: MRI HEAD FINDINGS

Brain: No acute infarct, mass effect or extra-axial collection. No
acute or chronic hemorrhage. There is multifocal hyperintense
T2-weighted signal within the white matter. Generalized volume loss
without a clear lobar predilection. The midline structures are
normal.

Vascular: Major flow voids are preserved.

Skull and upper cervical spine: Normal calvarium and skull base.
Visualized upper cervical spine and soft tissues are normal.

Sinuses/Orbits:No paranasal sinus fluid levels or advanced mucosal
thickening. No mastoid or middle ear effusion. Normal orbits.

MRI ORBITS FINDINGS

Orbits:

--Globes: Normal.

--Bony orbit: Normal.

--Preseptal soft tissues: Normal.

--Intra- and extraconal orbital fat: Normal. No inflammatory
stranding.

--Optic nerves: Normal.

--Lacrimal glands and fossae: Normal.

--Extraocular muscles: Normal.

Visualized sinuses:  No fluid levels or advanced mucosal thickening.

Soft tissues: Normal.

Limited intracranial: Normal.
IMPRESSION: 1. Mild chronic small vessel disease but otherwise normal aging
brain.
2. Normal orbits.

## 2022-05-13 ENCOUNTER — Telehealth: Payer: Self-pay | Admitting: Cardiology

## 2022-05-13 NOTE — Telephone Encounter (Signed)
Pt came in the office and stated he was told by the PA to contact her when he fell into the donut hole.

## 2022-05-15 NOTE — Telephone Encounter (Signed)
Spoke with pt. Informed that I would mail Eliquis pt assistance forms to his home. Pt agrees to fill out forms and return to office.

## 2022-05-17 ENCOUNTER — Encounter: Payer: Self-pay | Admitting: Gastroenterology

## 2022-05-17 ENCOUNTER — Ambulatory Visit: Payer: PPO | Admitting: Gastroenterology

## 2022-05-17 DIAGNOSIS — K8581 Other acute pancreatitis with uninfected necrosis: Secondary | ICD-10-CM

## 2022-05-17 DIAGNOSIS — K859 Acute pancreatitis without necrosis or infection, unspecified: Secondary | ICD-10-CM | POA: Insufficient documentation

## 2022-05-17 NOTE — Patient Instructions (Signed)
So glad to see you today! I will touch base with Dr. Abbey Chatters next week when he returns regarding his plan for follow up imaging of your pancreas and possible upper endoscopy. We will keep you posted.

## 2022-05-17 NOTE — Progress Notes (Unsigned)
GI Office Note    Referring Provider: Celene Squibb, MD Primary Care Physician:  Celene Squibb, MD  Primary Gastroenterologist: Elon Alas. Abbey Chatters, DO   Chief Complaint   Chief Complaint  Patient presents with   Follow-up    Doing well.     History of Present Illness   Sean Golden is a 76 y.o. male presenting today for follow-up of necrotizing pancreatitis.  He was last seen in April.  Also with history of A-fib on Eliquis, hypertension, GERD.  Prolonged hospitalization for necrotizing pancreatitis back in February.  Readmitted February 24 with hematemesis in the setting of Eliquis, EGD on February 25 with evidence of hematin in the gastric fundus status post Hemang, NG trauma in the antrum, nonbleeding duodenal ulcer, duodenitis with noncritical luminal narrowing involving second portion of duodenum.  CT February 25 with contrast revealing interval worsening of complicated/necrotizing pancreatitis with interval increase in peripancreatic fluid and inflammation, development of adjacent acute necrotic collections.  Discharged on February 27.  Back to the ED March 6 with hematemesis, CT with findings of severe distention of the distal esophagus and stomach with a transition point in the duodenum which was new from prior, residual inflammatory changes around the pancreas, majority of pseudocyst collections have decreased in size with the exception of collection in the pancreatic tail, with noted gas lateral to the duodenum.  Patient unable to tolerate tube feeds due to duodenitis/duodenal stricture.  Transition to TPN.     Some mild early satiety. Six month schedule with dr. Nevada Crane.  Goes back in 06/2022  Never heavy etoh 2 glasses of wine per night.     CT abdomen pelvis with contrast January 16, 2022 showed interval improvement in inflammatory changes within and surrounding the pancreas.  No evidence of pancreatic necrosis.  Peripancreatic fluid collections adjacent to the  pancreatic tail improved measuring 2.4 x 1.4 cm and 3.6 x 3.0 cm.  Stomach no longer significantly distended and duodenum patent.  Persistent extrinsic compression of the splenic vein by the peripancreatic inflammatory changes, but no evidence of acute venous thrombosis or occlusion.  Portal and superior mesenteric veins patent.  10/27/21 260 pounds March 01, 2022 227 pounds Today 247 pounds  Medications   Current Outpatient Medications  Medication Sig Dispense Refill   acetaminophen (TYLENOL) 325 MG tablet Take 2 tablets (650 mg total) by mouth every 6 (six) hours as needed for mild pain, fever or headache (or Fever >/= 101).     apixaban (ELIQUIS) 5 MG TABS tablet Take 1 tablet (5 mg total) by mouth 2 (two) times daily. 60 tablet 11   diltiazem (CARDIZEM CD) 240 MG 24 hr capsule Take 1 capsule (240 mg total) by mouth daily. 90 capsule 3   pantoprazole (PROTONIX) 40 MG tablet Take 1 tablet (40 mg total) by mouth 2 (two) times daily. 60 tablet 5   tamsulosin (FLOMAX) 0.4 MG CAPS capsule Take 2 capsules (0.8 mg total) by mouth every evening. (Patient taking differently: Take 0.8 mg by mouth daily after breakfast.) 60 capsule 2   No current facility-administered medications for this visit.    Allergies   Allergies as of 05/17/2022   (No Known Allergies)     Past Medical History   Past Medical History:  Diagnosis Date   Atrial fibrillation ([place]) 05/2014   Essential hypertension    GERD (gastroesophageal reflux disease)    Helicobacter pylori gastritis 08/2014   Treated with Pylera   Pancreatitis  Sleep apnea     Past Surgical History   Past Surgical History:  Procedure Laterality Date   CARDIAC ELECTROPHYSIOLOGY STUDY AND ABLATION  2010?   COLONOSCOPY  2005   SOLITARY RECTAL ULCER   COLONOSCOPY N/A 09/06/2014   Dr. Oneida Alar: three colon polyps, moderate diverticulosis throughout the entire examined colon   ESOPHAGOGASTRODUODENOSCOPY N/A 09/06/2014   Dr. Fields:moderate  erosive gastritis, no Barrett's. +H.pylori gastritis, treated with Pylera   ESOPHAGOGASTRODUODENOSCOPY (EGD) WITH PROPOFOL N/A 11/17/2021   Procedure: ESOPHAGOGASTRODUODENOSCOPY (EGD) WITH PROPOFOL;  Surgeon: Rogene Houston, MD;  [facility];  Service: Endoscopy;  Laterality: N/A;   ESOPHAGOGASTRODUODENOSCOPY (EGD) WITH PROPOFOL N/A 11/27/2021   Procedure: ESOPHAGOGASTRODUODENOSCOPY (EGD) WITH PROPOFOL;  Surgeon: Harvel Quale, MD;  [facility];  Service: Gastroenterology;  Laterality: N/A;   HEMORRHOID BANDING N/A 09/06/2014   NO BANDING PERFORMED. NO INTERNAL HEMORRHOIDS IDENTIFIED    UPPER GASTROINTESTINAL ENDOSCOPY  2005 RMR   VASECTOMY      Past Family History   Family History  Problem Relation Age of Onset   Hypertension Mother    Heart attack Father    Hyperlipidemia Sister    Hyperlipidemia Brother    Colon polyps Brother    Hypertension Brother    Hypertension Sister    Diabetes Brother    Stroke Other    Diabetes Other    Hypertension Other    Hyperlipidemia Other    Colon cancer Neg Hx     Past Social History   Social History   Socioeconomic History   Marital status: Married    Spouse name: Not on file   Number of children: Not on file   Years of education: Not on file   Highest education level: Not on file  Occupational History   Occupation: Full time  Tobacco Use   Smoking status: Former    Packs/day: 0.50    Years: 15.00    Total pack years: 7.50    Types: Cigarettes    Start date: 05/28/1967    Quit date: 05/27/1984    Years since quitting: 37.9   Smokeless tobacco: Never  Vaping Use   Vaping Use: Never used  Substance and Sexual Activity   Alcohol use: Not Currently    Alcohol/week: 14.0 standard drinks of alcohol    Types: 14 Glasses of wine per week    Comment: hx of 1 or 2 glasses of wine at night; none currently   Drug use: No   Sexual activity: Not Currently  Other Topics Concern   Not on file  Social  History Narrative   Married-2 KIDS(AGE 64 AND 38).   No regular exercise   Social Determinants of Health   Financial Resource Strain: Not on file  Food Insecurity: Not on file  Transportation Needs: Not on file  Physical Activity: Not on file  Stress: Not on file  Social Connections: Not on file  Intimate Partner Violence: Not on file    Review of Systems   General: Negative for anorexia, weight loss, fever, chills, fatigue, weakness. ENT: Negative for hoarseness, difficulty swallowing , nasal congestion. CV: Negative for chest pain, angina, palpitations, dyspnea on exertion, peripheral edema.  Respiratory: Negative for dyspnea at rest, dyspnea on exertion, cough, sputum, wheezing.  GI: See history of present illness. GU:  Negative for dysuria, hematuria, urinary incontinence, urinary frequency, nocturnal urination.  Endo: Negative for unusual weight change.     Physical Exam   BP 136/89 (BP Location: Left Arm,  Patient Position: Sitting, Cuff Size: Large)   Pulse 78   Temp (!) 97.5 F (36.4 C) (Temporal)   Ht 5' 9.5" (1.765 m)   Wt 227 lb 6.4 oz (103.1 kg)   SpO2 97%   BMI 33.10 kg/m    General: Well-nourished, well-developed in no acute distress.  Eyes: No icterus. Mouth: Oropharyngeal mucosa moist and pink , no lesions erythema or exudate. Lungs: Clear to auscultation bilaterally.  Heart: Regular rate and rhythm, no murmurs rubs or gallops.  Abdomen: Bowel sounds are normal, nontender, nondistended, no hepatosplenomegaly or masses,  no abdominal bruits or hernia , no rebound or guarding.  Rectal: ***  Extremities: No lower extremity edema. No clubbing or deformities. Neuro: Alert and oriented x 4   Skin: Warm and dry, no jaundice.   Psych: Alert and cooperative, normal mood and affect.  Labs   *** Imaging Studies   No results found.  Assessment       PLAN   *** Does he ct or eus or egd Labs 06/2022 Finally getting strength back. Tpn 7 weeks.      Laureen Ochs. Bobby Rumpf, [place], [facility]

## 2022-05-20 DIAGNOSIS — H2513 Age-related nuclear cataract, bilateral: Secondary | ICD-10-CM | POA: Diagnosis not present

## 2022-05-20 DIAGNOSIS — H21261 Iris atrophy (essential) (progressive), right eye: Secondary | ICD-10-CM | POA: Diagnosis not present

## 2022-05-20 DIAGNOSIS — H1820 Unspecified corneal edema: Secondary | ICD-10-CM | POA: Diagnosis not present

## 2022-05-20 DIAGNOSIS — H4051X0 Glaucoma secondary to other eye disorders, right eye, stage unspecified: Secondary | ICD-10-CM | POA: Diagnosis not present

## 2022-07-15 ENCOUNTER — Telehealth: Payer: Self-pay

## 2022-07-15 NOTE — Telephone Encounter (Signed)
Patient called today inquiring whehter or not you had the chance to speak with Dr. Abbey Chatters regarding him. Patient states he was told at ov on 05/17/2022 that we would speak with Dr. Abbey Chatters regarding what was the next step for him. Please advise.   Instructions 05/17/2022 ov  So glad to see you today! I will touch base with Dr. Abbey Chatters next week when he returns regarding his plan for follow up imaging of your pancreas and possible upper endoscopy. We will keep you posted.

## 2022-07-16 DIAGNOSIS — Z125 Encounter for screening for malignant neoplasm of prostate: Secondary | ICD-10-CM | POA: Diagnosis not present

## 2022-07-16 DIAGNOSIS — R7303 Prediabetes: Secondary | ICD-10-CM | POA: Diagnosis not present

## 2022-07-16 DIAGNOSIS — E782 Mixed hyperlipidemia: Secondary | ICD-10-CM | POA: Diagnosis not present

## 2022-07-16 NOTE — Telephone Encounter (Signed)
Please apologize to the patient, I did discuss with Dr. Abbey Chatters but did not pass on to clinical team to call patient. See below.   He said as long as he was doing well he would not need follow up CT or endoscopy.  We do want him to return for routine follow up with Dr. Abbey Chatters in 2 months.         See below from Dr. Abbey Chatters.   Yes I saw the CT and called patient and his wife with the results at the time of the CT. I guess I forgot to document that I talked to them.  I think since he is doing so well clinically and his CT looks much improved that we can just watch him for now.  Appears his gastric outlet obstruction has also resolved so as long as he is doing well, I think we can hold off on EGD as well.  Thank you        Previous Messages    ----- Message -----  From: Westly Pam  Sent: 05/19/2022  10:21 PM EDT  To: Mahala Menghini, PA-C; Eloise Harman, DO   I saw this patient last Friday for follow up of necrotizing pancreatitis complicated by pseudocysts and duodenal stricture. After you saw him in the office in April, you ordered a CT to determine if EUS needed. And mentioned need to repeat EGD. I don't know if you saw his CT from 12/2021.   Do you mind reviewing CT report and let me know what you were thinking to do with this one? Wasn't sure if he would need repeat CT pancreatic protocol or EUS (like are we satisfied that etiology of pancreatitis is etoh or microlithiasis?). And do we still need EGD. By the way, he is doing well clinically other than mild early satiety.

## 2022-07-16 NOTE — Telephone Encounter (Signed)
Patient made aware of all, but wanted me to mention that he had some epigastric discomfort, nausea,dry heaves and cold sweats on 07/12/2022, this has subsided, but he says he is trying to decrease amount of foods, eliminated fried foods and has been eating soup. Patient just wanted me to mention this to you, to see if he still needs to wait two months for a follow up with Dr. Abbey Chatters, or any imaging. Please advise.

## 2022-07-17 NOTE — Telephone Encounter (Signed)
Mitzie, Please call the patient and set up an appt with Dr. Abbey Chatters either here on Wednesdays or Thursday at [place]. Thanks  I spoke with the patient and let him know he will need to be seen sooner. I have transferred the patient to Mitzie to schedule.

## 2022-07-17 NOTE — Telephone Encounter (Signed)
Let's go ahead and have him come back in now to see Dr. Abbey Chatters since he is having some symptoms.

## 2022-07-22 DIAGNOSIS — Z125 Encounter for screening for malignant neoplasm of prostate: Secondary | ICD-10-CM | POA: Diagnosis not present

## 2022-07-22 DIAGNOSIS — I10 Essential (primary) hypertension: Secondary | ICD-10-CM | POA: Diagnosis not present

## 2022-07-22 DIAGNOSIS — R7303 Prediabetes: Secondary | ICD-10-CM | POA: Diagnosis not present

## 2022-07-22 DIAGNOSIS — H409 Unspecified glaucoma: Secondary | ICD-10-CM | POA: Diagnosis not present

## 2022-07-22 DIAGNOSIS — E782 Mixed hyperlipidemia: Secondary | ICD-10-CM | POA: Diagnosis not present

## 2022-07-22 DIAGNOSIS — H18221 Idiopathic corneal edema, right eye: Secondary | ICD-10-CM | POA: Diagnosis not present

## 2022-07-22 DIAGNOSIS — R3915 Urgency of urination: Secondary | ICD-10-CM | POA: Diagnosis not present

## 2022-07-22 DIAGNOSIS — K8501 Idiopathic acute pancreatitis with uninfected necrosis: Secondary | ICD-10-CM | POA: Diagnosis not present

## 2022-07-22 DIAGNOSIS — Z23 Encounter for immunization: Secondary | ICD-10-CM | POA: Diagnosis not present

## 2022-07-22 DIAGNOSIS — K219 Gastro-esophageal reflux disease without esophagitis: Secondary | ICD-10-CM | POA: Diagnosis not present

## 2022-07-22 DIAGNOSIS — D649 Anemia, unspecified: Secondary | ICD-10-CM | POA: Diagnosis not present

## 2022-07-22 DIAGNOSIS — I482 Chronic atrial fibrillation, unspecified: Secondary | ICD-10-CM | POA: Diagnosis not present

## 2022-08-07 ENCOUNTER — Ambulatory Visit (INDEPENDENT_AMBULATORY_CARE_PROVIDER_SITE_OTHER): Payer: PPO | Admitting: Internal Medicine

## 2022-08-07 ENCOUNTER — Encounter: Payer: Self-pay | Admitting: Internal Medicine

## 2022-08-07 VITALS — BP 128/89 | HR 74 | Temp 98.1°F | Ht 69.5 in | Wt 230.6 lb

## 2022-08-07 DIAGNOSIS — K2980 Duodenitis without bleeding: Secondary | ICD-10-CM | POA: Diagnosis not present

## 2022-08-07 DIAGNOSIS — K219 Gastro-esophageal reflux disease without esophagitis: Secondary | ICD-10-CM | POA: Diagnosis not present

## 2022-08-07 DIAGNOSIS — K8511 Biliary acute pancreatitis with uninfected necrosis: Secondary | ICD-10-CM | POA: Diagnosis not present

## 2022-08-07 DIAGNOSIS — D123 Benign neoplasm of transverse colon: Secondary | ICD-10-CM

## 2022-08-07 DIAGNOSIS — R112 Nausea with vomiting, unspecified: Secondary | ICD-10-CM

## 2022-08-07 NOTE — Progress Notes (Signed)
Referring Provider: Celene Squibb, MD Primary Care Physician:  Celene Squibb, MD Primary GI:  Dr. Abbey Chatters  Chief Complaint  Patient presents with   Abdominal Pain    Patient called to report abdominal pain and nausea about one month ago. Reports symptoms lasted that one day. Better now. Was advised to make appointment when he called to follow up to see if he needs upper endoscopy and imaging of his pancreas.     HPI:   Sean Golden is a 76 y.o. male who presents to the clinic today for follow-up visit.  History of of A-fib on Eliquis, hypertension, GERD, necrotizing pancreatitis.  Patient was discharged from [facility] on 11/12/21 after prolonged hospitalization for necrotizing pancreatitis.  Readmitted on February 24 with hematemesis in the setting of Eliquis, he underwent EGD on February 25 with evidence of hematin in gastric fundus status post suctioning, NG trauma in the antrum, nonbleeding duodenal ulcer, duodenitis with noncritical luminal narrowing involving second portion of duodenum.  CT February 25 with contrast revealed interval worsening of complicated/necrotizing pancreatitis with interval increase in peripancreatic fluid and inflammation, development of adjacent acute necrotic collections.  He was discharged on February 27 with PPI twice daily, full liquid diet.    Presented back  to the ED March 6 with hematemesis, CT with findings of severe distention of the distal esophagus and stomach with a transition point in the duodenum which was new from prior, residual inflammatory changes around the pancreas, majority of pseudocyst collections having decreased in size with the exception of collection near the pancreatic tail, with noted gas lateral to the duodenum.   Patient was unable to tolerate tube feeds due to duodenitis/duodenal stricture.  Was transitioned to TPN and discharge.  Was eventually weaned off TPN.  CT abdomen pelvis with contrast January 16, 2022 showed  interval improvement in inflammatory changes within and surrounding the pancreas.  No evidence of pancreatic necrosis.  Peripancreatic fluid collections adjacent to the pancreatic tail improved measuring 2.4 x 1.4 cm and 3.6 x 3.0 cm.  Stomach no longer significantly distended and duodenum patent.  Persistent extrinsic compression of the splenic vein by the peripancreatic inflammatory changes, but no evidence of acute venous thrombosis or occlusion.  Portal and superior mesenteric veins patent.   Today, he states he overall is doing well.  Notes good appetite.  Weight stable.  Did have 1 episode on 07/12/2022 of epigastric discomfort, nausea, dry heaves and cold sweats.  This episode only lasted briefly.  No issues since.  Last colonoscopy 2015 with a few tubular adenomas removed.     Past Medical History:  Diagnosis Date   Atrial fibrillation ([place]) 05/2014   Essential hypertension    GERD (gastroesophageal reflux disease)    Helicobacter pylori gastritis 08/2014   Treated with Pylera   Pancreatitis    Sleep apnea     Past Surgical History:  Procedure Laterality Date   CARDIAC ELECTROPHYSIOLOGY STUDY AND ABLATION  2010?   COLONOSCOPY  2005   SOLITARY RECTAL ULCER   COLONOSCOPY N/A 09/06/2014   Dr. Oneida Alar: three colon polyps, moderate diverticulosis throughout the entire examined colon   ESOPHAGOGASTRODUODENOSCOPY N/A 09/06/2014   Dr. Fields:moderate erosive gastritis, no Barrett's. +H.pylori gastritis, treated with Pylera   ESOPHAGOGASTRODUODENOSCOPY (EGD) WITH PROPOFOL N/A 11/17/2021   Procedure: ESOPHAGOGASTRODUODENOSCOPY (EGD) WITH PROPOFOL;  Surgeon: Rogene Houston, MD;  [facility];  Service: Endoscopy;  Laterality: N/A;   ESOPHAGOGASTRODUODENOSCOPY (EGD) WITH PROPOFOL N/A 11/27/2021  Procedure: ESOPHAGOGASTRODUODENOSCOPY (EGD) WITH PROPOFOL;  Surgeon: Harvel Quale, MD;  [facility];  Service: Gastroenterology;  Laterality: N/A;   HEMORRHOID  BANDING N/A 09/06/2014   NO BANDING PERFORMED. NO INTERNAL HEMORRHOIDS IDENTIFIED    UPPER GASTROINTESTINAL ENDOSCOPY  2005 RMR   VASECTOMY      Current Outpatient Medications  Medication Sig Dispense Refill   acetaminophen (TYLENOL) 325 MG tablet Take 2 tablets (650 mg total) by mouth every 6 (six) hours as needed for mild pain, fever or headache (or Fever >/= 101).     apixaban (ELIQUIS) 5 MG TABS tablet Take 1 tablet (5 mg total) by mouth 2 (two) times daily. 60 tablet 11   diltiazem (CARDIZEM CD) 240 MG 24 hr capsule Take 1 capsule (240 mg total) by mouth daily. 90 capsule 3   pantoprazole (PROTONIX) 40 MG tablet Take 1 tablet (40 mg total) by mouth 2 (two) times daily. 60 tablet 5   tamsulosin (FLOMAX) 0.4 MG CAPS capsule Take 2 capsules (0.8 mg total) by mouth every evening. (Patient taking differently: Take 0.8 mg by mouth daily after breakfast.) 60 capsule 2   No current facility-administered medications for this visit.    Allergies as of 08/07/2022   (No Known Allergies)    Family History  Problem Relation Age of Onset   Hypertension Mother    Heart attack Father    Hyperlipidemia Sister    Hyperlipidemia Brother    Colon polyps Brother    Hypertension Brother    Hypertension Sister    Diabetes Brother    Stroke Other    Diabetes Other    Hypertension Other    Hyperlipidemia Other    Colon cancer Neg Hx     Social History   Socioeconomic History   Marital status: Married    Spouse name: Not on file   Number of children: Not on file   Years of education: Not on file   Highest education level: Not on file  Occupational History   Occupation: Full time  Tobacco Use   Smoking status: Former    Packs/day: 0.50    Years: 15.00    Total pack years: 7.50    Types: Cigarettes    Start date: 05/28/1967    Quit date: 05/27/1984    Years since quitting: 38.2    Passive exposure: Past   Smokeless tobacco: Never  Vaping Use   Vaping Use: Never used  Substance and  Sexual Activity   Alcohol use: Not Currently    Alcohol/week: 14.0 standard drinks of alcohol    Types: 14 Glasses of wine per week    Comment: hx of 1 or 2 glasses of wine at night; none currently   Drug use: No   Sexual activity: Not Currently  Other Topics Concern   Not on file  Social History Narrative   Married-2 KIDS(AGE 41 AND 38).   No regular exercise   Social Determinants of Health   Financial Resource Strain: Not on file  Food Insecurity: Not on file  Transportation Needs: Not on file  Physical Activity: Not on file  Stress: Not on file  Social Connections: Not on file    Subjective: Review of Systems  Constitutional:  Negative for chills and fever.  HENT:  Negative for congestion and hearing loss.   Eyes:  Negative for blurred vision and double vision.  Respiratory:  Negative for cough and shortness of breath.   Cardiovascular:  Negative for chest pain and palpitations.  Gastrointestinal:  Positive for abdominal pain. Negative for blood in stool, constipation, diarrhea, heartburn, melena and vomiting.  Genitourinary:  Negative for dysuria and urgency.  Musculoskeletal:  Negative for joint pain and myalgias.  Skin:  Negative for itching and rash.  Neurological:  Negative for dizziness and headaches.  Psychiatric/Behavioral:  Negative for depression. The patient is not nervous/anxious.      Objective: BP 128/89 (BP Location: Left Arm, Patient Position: Sitting, Cuff Size: Large)   Pulse 74   Temp 98.1 F (36.7 C) (Oral)   Ht 5' 9.5" (1.765 m)   Wt 230 lb 9.6 oz (104.6 kg)   BMI 33.57 kg/m  Physical Exam Constitutional:      Appearance: Normal appearance.  HENT:     Head: Normocephalic and atraumatic.  Eyes:     Extraocular Movements: Extraocular movements intact.     Conjunctiva/sclera: Conjunctivae normal.  Cardiovascular:     Rate and Rhythm: Normal rate and regular rhythm.  Pulmonary:     Effort: Pulmonary effort is normal.     Breath sounds:  Normal breath sounds.  Abdominal:     General: Bowel sounds are normal.     Palpations: Abdomen is soft.     Tenderness: There is abdominal tenderness.  Musculoskeletal:        General: Normal range of motion.     Cervical back: Normal range of motion and neck supple.  Skin:    General: Skin is warm.  Neurological:     General: No focal deficit present.     Mental Status: He is alert and oriented to person, place, and time.  Psychiatric:        Mood and Affect: Mood normal.        Behavior: Behavior normal.      Assessment: *Pancreatitis complicated by formation of multiple pseudocysts *Gastric outlet obstruction-due to duodenitis/duodenal stricture in relation to above *Abdominal pain-improved *Nausea and vomiting-resolved *Adenomatous colon polyps  Plan: Patient clinically doing much better today.  Most recent CT imaging showed improvement in overall pancreatitis.  Recent episode of nausea, dry heaves, cold sweats was very brief.  Think we can continue to monitor for now.  If happens again or has more chronic symptoms we will consider repeat CT imaging and/or EGD to further evaluate.  Continue on pantoprazole twice daily.  Needs colonoscopy for surveillance purposes.  We will discuss on follow-up visit in 4 months.  08/07/2022 3:12 PM   Disclaimer: This note was dictated with voice recognition software. Similar sounding words can inadvertently be transcribed and may not be corrected upon review.

## 2022-08-07 NOTE — Patient Instructions (Addendum)
I am happy to hear that you are feeling better.  I think we can continue to monitor for now as this was such an acute episode that lasted very briefly.  If you have further episodes however, we may need to consider repeat CT imaging of your abdomen and/or EGD to further investigate.  Continue on pantoprazole twice daily.  Follow-up with GI in 4 months.  It was very nice seeing you again today.  I hope you and Manus Gunning have a great Thanksgiving and Merry Christmas.  Dr. Abbey Chatters

## 2022-09-27 DIAGNOSIS — H21261 Iris atrophy (essential) (progressive), right eye: Secondary | ICD-10-CM | POA: Diagnosis not present

## 2022-09-27 DIAGNOSIS — H2513 Age-related nuclear cataract, bilateral: Secondary | ICD-10-CM | POA: Diagnosis not present

## 2022-09-27 DIAGNOSIS — H40051 Ocular hypertension, right eye: Secondary | ICD-10-CM | POA: Diagnosis not present

## 2022-09-27 DIAGNOSIS — H2511 Age-related nuclear cataract, right eye: Secondary | ICD-10-CM | POA: Diagnosis not present

## 2022-09-27 DIAGNOSIS — H35371 Puckering of macula, right eye: Secondary | ICD-10-CM | POA: Diagnosis not present

## 2022-09-27 DIAGNOSIS — H1820 Unspecified corneal edema: Secondary | ICD-10-CM | POA: Diagnosis not present

## 2022-09-27 DIAGNOSIS — H4051X0 Glaucoma secondary to other eye disorders, right eye, stage unspecified: Secondary | ICD-10-CM | POA: Diagnosis not present

## 2022-09-27 DIAGNOSIS — H21269 Iris atrophy (essential) (progressive), unspecified eye: Secondary | ICD-10-CM | POA: Diagnosis not present

## 2022-09-30 ENCOUNTER — Telehealth: Payer: Self-pay | Admitting: Cardiology

## 2022-09-30 DIAGNOSIS — I700 Atherosclerosis of aorta: Secondary | ICD-10-CM

## 2022-09-30 NOTE — Telephone Encounter (Signed)
I will forward back to pre op app to clarify note Eliquis hold.

## 2022-09-30 NOTE — Telephone Encounter (Signed)
   Pre-operative Risk Assessment    Patient Name: Sean Golden  DOB: 05/23/1946 MRN: [id number]      Request for Surgical Clearance    Procedure:   Cornea Transplant Surgery under retrobulbar block  Date of Surgery:  Clearance 10/17/22                                 Surgeon:  Susa Simmonds MD Surgeon's Group or Practice Name:  [facility]  Phone number:  [phone] Fax number:  [phone]   Type of Clearance Requested:   - Medical  - Pharmacy:  Hold Apixaban (Eliquis) 2 days    Type of Anesthesia:   Retrobulbar Block with GO   Additional requests/questions:    Crist Infante   09/30/2022, 4:11 PM

## 2022-10-01 ENCOUNTER — Telehealth: Payer: Self-pay | Admitting: *Deleted

## 2022-10-01 DIAGNOSIS — I700 Atherosclerosis of aorta: Secondary | ICD-10-CM | POA: Insufficient documentation

## 2022-10-01 NOTE — Telephone Encounter (Signed)
Pt has been scheduled for tele pre op appt 1/12 @ 2:40. Med rec and consent are done.

## 2022-10-01 NOTE — Telephone Encounter (Signed)
Patient with diagnosis of afib on Eliquis for anticoagulation.    Procedure: Cornea Transplant Surgery under retrobulbar block  Date of procedure: 10/17/22  CHA2DS2-VASc Score = 3  This indicates a 3.2% annual risk of stroke. The patient's score is based upon: CHF History: 0 HTN History: 1 Diabetes History: 0 Stroke History: 0 Vascular Disease History: 1 Age Score: 1 Gender Score: 0   CrCl >144m/min Platelet count 321K  Per office protocol, patient can hold Eliquis for 2 days prior to procedure as requested.    **This guidance is not considered finalized until pre-operative APP has relayed final recommendations.**

## 2022-10-01 NOTE — Telephone Encounter (Signed)
   Name: Sean Golden  DOB: 1945-12-25  MRN: [id number]  Primary Cardiologist: Rozann Lesches, MD  Chart reviewed as part of pre-operative protocol coverage. Because of Sean Golden's past medical history and time since last visit, he will require a follow-up telephone visit in order to better assess preoperative cardiovascular risk.  Pre-op covering staff: - Please schedule appointment and call patient to inform them. If patient already had an upcoming appointment within acceptable timeframe, please add "pre-op clearance" to the appointment notes so provider is aware. - Please contact requesting surgeon's office via preferred method (i.e, phone, fax) to inform them of need for appointment prior to surgery.  Per office protocol, patient can hold Eliquis for 2 days prior to procedure as requested.      Elgie Collard, PA-C  10/01/2022, 12:04 PM

## 2022-10-01 NOTE — Telephone Encounter (Signed)
Pt has been scheduled for tele pre op appt 1/12 @ 2:40. Med rec and consent are done.    Patient Consent for Virtual Visit        Sean Golden has provided verbal consent on 10/01/2022 for a virtual visit (video or telephone).   CONSENT FOR VIRTUAL VISIT FOR:  Sean Golden  By participating in this virtual visit I agree to the following:  I hereby voluntarily request, consent and authorize [facility] and its employed or contracted physicians, physician assistants, nurse practitioners or other licensed health care professionals (the Practitioner), to provide me with telemedicine health care services (the "Services") as deemed necessary by the treating Practitioner. I acknowledge and consent to receive the Services by the Practitioner via telemedicine. I understand that the telemedicine visit will involve communicating with the Practitioner through live audiovisual communication technology and the disclosure of certain medical information by electronic transmission. I acknowledge that I have been given the opportunity to request an in-person assessment or other available alternative prior to the telemedicine visit and am voluntarily participating in the telemedicine visit.  I understand that I have the right to withhold or withdraw my consent to the use of telemedicine in the course of my care at any time, without affecting my right to future care or treatment, and that the Practitioner or I may terminate the telemedicine visit at any time. I understand that I have the right to inspect all information obtained and/or recorded in the course of the telemedicine visit and may receive copies of available information for a reasonable fee.  I understand that some of the potential risks of receiving the Services via telemedicine include:  Delay or interruption in medical evaluation due to technological equipment failure or disruption; Information transmitted may not be sufficient (e.g. poor  resolution of images) to allow for appropriate medical decision making by the Practitioner; and/or  In rare instances, security protocols could fail, causing a breach of personal health information.  Furthermore, I acknowledge that it is my responsibility to provide information about my medical history, conditions and care that is complete and accurate to the best of my ability. I acknowledge that Practitioner's advice, recommendations, and/or decision may be based on factors not within their control, such as incomplete or inaccurate data provided by me or distortions of diagnostic images or specimens that may result from electronic transmissions. I understand that the practice of medicine is not an exact science and that Practitioner makes no warranties or guarantees regarding treatment outcomes. I acknowledge that a copy of this consent can be made available to me via my patient portal ([facility]), or I can request a printed copy by calling the office of [facility].    I understand that my insurance will be billed for this visit.   I have read or had this consent read to me. I understand the contents of this consent, which adequately explains the benefits and risks of the Services being provided via telemedicine.  I have been provided ample opportunity to ask questions regarding this consent and the Services and have had my questions answered to my satisfaction. I give my informed consent for the services to be provided through the use of telemedicine in my medical care

## 2022-10-03 NOTE — Progress Notes (Signed)
Virtual Visit via Telephone Note   Because of Sean Golden's co-morbid illnesses, he is at least at moderate risk for complications without adequate follow up.  This format is felt to be most appropriate for this patient at this time.  The patient did not have access to video technology/had technical difficulties with video requiring transitioning to audio format only (telephone).  All issues noted in this document were discussed and addressed.  No physical exam could be performed with this format.  Please refer to the patient's chart for his consent to telehealth for [facility].  Evaluation Performed:  Preoperative cardiovascular risk assessment _____________   Date:  10/03/2022   Patient ID:  Sean Golden, DOB 03-Jul-1946, MRN [id number] Patient Location:  Home Provider location:   Office  Primary Care Provider:  Celene Squibb, MD Primary Cardiologist:  Rozann Lesches, MD  Chief Complaint / Patient Profile   77 y.o. y/o male with a h/o permanent atrial fibrillation on chronic anticoagulation, HTN, OSA, GERD, pancreatitis who is pending cornea transplant surgery under retrobulbar block and presents today for telephonic preoperative cardiovascular risk assessment.  History of Present Illness    Sean Golden is a 77 y.o. male who presents via audio/video conferencing for a telehealth visit today.  Pt was last seen in cardiology clinic on 03/01/22 by Bernerd Pho, [place].  At that time Sean Golden was doing well.  The patient is now pending procedure as outlined above. Since his last visit, he  denies chest pain, shortness of breath, lower extremity edema, fatigue, palpitations, melena, hematuria, hemoptysis, diaphoresis, weakness, presyncope, syncope, orthopnea, and PND. He remains active with walking and various other forms of exercise with no concerning cardiac symptoms.    Past Medical History    Past Medical History:  Diagnosis Date   Atrial fibrillation ([place])  05/2014   Essential hypertension    GERD (gastroesophageal reflux disease)    Helicobacter pylori gastritis 08/2014   Treated with Pylera   Pancreatitis    Sleep apnea    Past Surgical History:  Procedure Laterality Date   CARDIAC ELECTROPHYSIOLOGY STUDY AND ABLATION  2010?   COLONOSCOPY  2005   SOLITARY RECTAL ULCER   COLONOSCOPY N/A 09/06/2014   Dr. Oneida Alar: three colon polyps, moderate diverticulosis throughout the entire examined colon   ESOPHAGOGASTRODUODENOSCOPY N/A 09/06/2014   Dr. Fields:moderate erosive gastritis, no Barrett's. +H.pylori gastritis, treated with Pylera   ESOPHAGOGASTRODUODENOSCOPY (EGD) WITH PROPOFOL N/A 11/17/2021   Procedure: ESOPHAGOGASTRODUODENOSCOPY (EGD) WITH PROPOFOL;  Surgeon: Rogene Houston, MD;  [facility];  Service: Endoscopy;  Laterality: N/A;   ESOPHAGOGASTRODUODENOSCOPY (EGD) WITH PROPOFOL N/A 11/27/2021   Procedure: ESOPHAGOGASTRODUODENOSCOPY (EGD) WITH PROPOFOL;  Surgeon: Harvel Quale, MD;  Location: [facility];  Service: Gastroenterology;  Laterality: N/A;   HEMORRHOID BANDING N/A 09/06/2014   NO BANDING PERFORMED. NO INTERNAL HEMORRHOIDS IDENTIFIED    UPPER GASTROINTESTINAL ENDOSCOPY  2005 RMR   VASECTOMY      Allergies  No Known Allergies  Home Medications    Prior to Admission medications   Medication Sig Start Date End Date Taking? Authorizing Provider  acetaminophen (TYLENOL) 325 MG tablet Take 2 tablets (650 mg total) by mouth every 6 (six) hours as needed for mild pain, fever or headache (or Fever >/= 101). 11/12/21   Johnson, Clanford L, MD  apixaban (ELIQUIS) 5 MG TABS tablet Take 1 tablet (5 mg total) by mouth 2 (two) times daily. 03/01/22   Ahmed Prima, Fransisco Hertz,  PA-C  diltiazem (CARDIZEM CD) 240 MG 24 hr capsule Take 1 capsule (240 mg total) by mouth daily. 03/01/22   Strader, Fransisco Hertz, PA-C  pantoprazole (PROTONIX) 40 MG tablet Take 1 tablet (40 mg total) by mouth 2 (two) times daily. 01/10/22 10/01/22   Eloise Harman, DO  tamsulosin (FLOMAX) 0.4 MG CAPS capsule Take 2 capsules (0.8 mg total) by mouth every evening. Patient taking differently: Take 0.8 mg by mouth daily after breakfast. 11/12/21   Murlean Iba, MD    Physical Exam    Vital Signs:  Sean Golden does not have vital signs available for review today.  Given telephonic nature of communication, physical exam is limited. AAOx3. NAD. Normal affect.  Speech and respirations are unlabored.  Accessory Clinical Findings    None  Assessment & Plan    1.  Preoperative Cardiovascular Risk Assessment: The patient is doing well from a cardiac perspective. Therefore, based on ACC/AHA guidelines, the patient would be at acceptable risk for the planned procedure without further cardiovascular testing. According to the Revised Cardiac Risk Index (RCRI), his Perioperative Risk of Major Cardiac Event is (%): 0.4 His Functional Capacity in METs is: 7.59 according to the [place] Activity Status Index (DASI).  The patient was advised that if he develops new symptoms prior to surgery to contact our office to arrange for a follow-up visit, and he verbalized understanding.  Per office protocol, patient can hold Eliquis for 2 days prior to procedure as requested.  A copy of this note will be routed to requesting surgeon.  Time:   Today, I have spent 10 minutes with the patient with telehealth technology discussing medical history, symptoms, and management plan.     Emmaline Life, NP-C  10/04/2022, 2:33 PM [address] Office [phone] Fax [phone]

## 2022-10-04 ENCOUNTER — Encounter: Payer: Self-pay | Admitting: Nurse Practitioner

## 2022-10-04 ENCOUNTER — Ambulatory Visit: Payer: PPO | Attending: Cardiovascular Disease | Admitting: Nurse Practitioner

## 2022-10-04 DIAGNOSIS — Z01810 Encounter for preprocedural cardiovascular examination: Secondary | ICD-10-CM

## 2022-10-17 DIAGNOSIS — Z87891 Personal history of nicotine dependence: Secondary | ICD-10-CM | POA: Diagnosis not present

## 2022-10-17 DIAGNOSIS — H18511 Endothelial corneal dystrophy, right eye: Secondary | ICD-10-CM | POA: Diagnosis not present

## 2022-10-17 DIAGNOSIS — H2511 Age-related nuclear cataract, right eye: Secondary | ICD-10-CM | POA: Diagnosis not present

## 2022-10-17 DIAGNOSIS — H1820 Unspecified corneal edema: Secondary | ICD-10-CM | POA: Diagnosis not present

## 2022-10-17 DIAGNOSIS — Z7901 Long term (current) use of anticoagulants: Secondary | ICD-10-CM | POA: Diagnosis not present

## 2022-10-21 DIAGNOSIS — H21261 Iris atrophy (essential) (progressive), right eye: Secondary | ICD-10-CM | POA: Diagnosis not present

## 2022-10-21 DIAGNOSIS — H409 Unspecified glaucoma: Secondary | ICD-10-CM | POA: Diagnosis not present

## 2022-10-21 DIAGNOSIS — H21269 Iris atrophy (essential) (progressive), unspecified eye: Secondary | ICD-10-CM | POA: Diagnosis not present

## 2022-10-21 DIAGNOSIS — H2512 Age-related nuclear cataract, left eye: Secondary | ICD-10-CM | POA: Diagnosis not present

## 2022-10-21 DIAGNOSIS — Z48810 Encounter for surgical aftercare following surgery on the sense organs: Secondary | ICD-10-CM | POA: Diagnosis not present

## 2022-10-21 DIAGNOSIS — H35371 Puckering of macula, right eye: Secondary | ICD-10-CM | POA: Diagnosis not present

## 2022-10-21 DIAGNOSIS — Z947 Corneal transplant status: Secondary | ICD-10-CM | POA: Diagnosis not present

## 2022-10-21 DIAGNOSIS — H1820 Unspecified corneal edema: Secondary | ICD-10-CM | POA: Diagnosis not present

## 2022-10-21 DIAGNOSIS — Z961 Presence of intraocular lens: Secondary | ICD-10-CM | POA: Diagnosis not present

## 2022-11-21 ENCOUNTER — Encounter: Payer: Self-pay | Admitting: Radiology

## 2022-12-11 ENCOUNTER — Encounter: Payer: Self-pay | Admitting: Internal Medicine

## 2022-12-11 ENCOUNTER — Encounter (INDEPENDENT_AMBULATORY_CARE_PROVIDER_SITE_OTHER): Payer: Self-pay | Admitting: *Deleted

## 2022-12-11 ENCOUNTER — Telehealth (INDEPENDENT_AMBULATORY_CARE_PROVIDER_SITE_OTHER): Payer: Self-pay | Admitting: *Deleted

## 2022-12-11 ENCOUNTER — Ambulatory Visit (INDEPENDENT_AMBULATORY_CARE_PROVIDER_SITE_OTHER): Payer: PPO | Admitting: Internal Medicine

## 2022-12-11 VITALS — BP 135/87 | HR 89 | Temp 97.4°F | Ht 69.5 in | Wt 230.4 lb

## 2022-12-11 DIAGNOSIS — Z7901 Long term (current) use of anticoagulants: Secondary | ICD-10-CM | POA: Diagnosis not present

## 2022-12-11 DIAGNOSIS — K8511 Biliary acute pancreatitis with uninfected necrosis: Secondary | ICD-10-CM | POA: Diagnosis not present

## 2022-12-11 DIAGNOSIS — G8929 Other chronic pain: Secondary | ICD-10-CM | POA: Diagnosis not present

## 2022-12-11 DIAGNOSIS — K315 Obstruction of duodenum: Secondary | ICD-10-CM

## 2022-12-11 DIAGNOSIS — R5383 Other fatigue: Secondary | ICD-10-CM | POA: Diagnosis not present

## 2022-12-11 DIAGNOSIS — R1013 Epigastric pain: Secondary | ICD-10-CM | POA: Diagnosis not present

## 2022-12-11 DIAGNOSIS — M546 Pain in thoracic spine: Secondary | ICD-10-CM | POA: Diagnosis not present

## 2022-12-11 DIAGNOSIS — D123 Benign neoplasm of transverse colon: Secondary | ICD-10-CM

## 2022-12-11 DIAGNOSIS — K219 Gastro-esophageal reflux disease without esophagitis: Secondary | ICD-10-CM

## 2022-12-11 DIAGNOSIS — E559 Vitamin D deficiency, unspecified: Secondary | ICD-10-CM | POA: Diagnosis not present

## 2022-12-11 MED ORDER — PANTOPRAZOLE SODIUM 40 MG PO TBEC: 40.0000 mg | DELAYED_RELEASE_TABLET | Freq: Two times a day (BID) | ORAL | 11 refills | Status: AC

## 2022-12-11 NOTE — Patient Instructions (Signed)
I am going to order a CT abdomen pelvis with contrast to further evaluate your new onset back pain given your history of pancreatitis prior.  We will call with results.  I am going to check blood work at [facility] lab today in regards to your fatigue.  We will call with results.  We will schedule you for surveillance colonoscopy given your history of polyps prior.  If CT findings are concerning, I may elect to add on upper endoscopy to be performed at the same time.  It was very nice seeing you again today.  Dr. Abbey Chatters

## 2022-12-11 NOTE — Progress Notes (Signed)
Referring Provider: Celene Squibb, MD Primary Care Physician:  Celene Squibb, MD Primary GI:  Dr. Abbey Chatters  Chief Complaint  Patient presents with   Pancreatitis    Follow up on pancreatitis. Reports he is having fatigue and pain in back. Takes protonix for gerd and reports it works well.     HPI:   Sean Golden is a 77 y.o. male who presents to the clinic today for follow-up visit.  History of of A-fib on Eliquis, hypertension, GERD, necrotizing pancreatitis.  Patient was discharged from [facility] on 11/12/21 after prolonged hospitalization for necrotizing pancreatitis.  Readmitted on February 24 with hematemesis in the setting of Eliquis, he underwent EGD on February 25 with evidence of hematin in gastric fundus status post suctioning, NG trauma in the antrum, nonbleeding duodenal ulcer, duodenitis with noncritical luminal narrowing involving second portion of duodenum.  CT 11/17/21 with contrast revealed interval worsening of complicated/necrotizing pancreatitis with interval increase in peripancreatic fluid and inflammation, development of adjacent acute necrotic collections.  He was discharged on February 27 with PPI twice daily, full liquid diet.    Presented back  to the ED 11/26/21 with hematemesis, CT with findings of severe distention of the distal esophagus and stomach with a transition point in the duodenum which was new from prior, residual inflammatory changes around the pancreas, majority of pseudocyst collections having decreased in size with the exception of collection near the pancreatic tail, with noted gas lateral to the duodenum.   Patient was unable to tolerate tube feeds due to duodenitis/duodenal stricture.  Was transitioned to TPN and discharged.  Was eventually weaned off TPN.  CT abdomen pelvis with contrast January 16, 2022 showed interval improvement in inflammatory changes within and surrounding the pancreas.  No evidence of pancreatic necrosis.   Peripancreatic fluid collections adjacent to the pancreatic tail improved measuring 2.4 x 1.4 cm and 3.6 x 3.0 cm.  Stomach no longer significantly distended and duodenum patent.  Persistent extrinsic compression of the splenic vein by the peripancreatic inflammatory changes, but no evidence of acute venous thrombosis or occlusion.  Portal and superior mesenteric veins patent.   Last colonoscopy 2015 with a few tubular adenomas removed.  Today, states for the past 3 to 4 weeks he has had worsening mid back pain.  He is concerned about his pancreas.  Occasional epigastric discomfort though primarily pain in his back, moderate in severity, intermittent.  Does not believe it is musculoskeletal.  Has chronic low back pain which is different.  Notes decreased appetite.  Also notes worsening fatigue.  States he is tired all the time.  Past Medical History:  Diagnosis Date   Atrial fibrillation ([place]) 05/2014   Essential hypertension    GERD (gastroesophageal reflux disease)    Helicobacter pylori gastritis 08/2014   Treated with Pylera   Pancreatitis    Sleep apnea     Past Surgical History:  Procedure Laterality Date   CARDIAC ELECTROPHYSIOLOGY STUDY AND ABLATION  2010?   COLONOSCOPY  2005   SOLITARY RECTAL ULCER   COLONOSCOPY N/A 09/06/2014   Dr. Oneida Alar: three colon polyps, moderate diverticulosis throughout the entire examined colon   ESOPHAGOGASTRODUODENOSCOPY N/A 09/06/2014   Dr. Fields:moderate erosive gastritis, no Barrett's. +H.pylori gastritis, treated with Pylera   ESOPHAGOGASTRODUODENOSCOPY (EGD) WITH PROPOFOL N/A 11/17/2021   Procedure: ESOPHAGOGASTRODUODENOSCOPY (EGD) WITH PROPOFOL;  Surgeon: Rogene Houston, MD;  [facility];  Service: Endoscopy;  Laterality: N/A;   ESOPHAGOGASTRODUODENOSCOPY (EGD) WITH PROPOFOL  N/A 11/27/2021   Procedure: ESOPHAGOGASTRODUODENOSCOPY (EGD) WITH PROPOFOL;  Surgeon: Harvel Quale, MD;  [facility];  Service:  Gastroenterology;  Laterality: N/A;   HEMORRHOID BANDING N/A 09/06/2014   NO BANDING PERFORMED. NO INTERNAL HEMORRHOIDS IDENTIFIED    UPPER GASTROINTESTINAL ENDOSCOPY  2005 RMR   VASECTOMY      Current Outpatient Medications  Medication Sig Dispense Refill   acetaminophen (TYLENOL) 325 MG tablet Take 2 tablets (650 mg total) by mouth every 6 (six) hours as needed for mild pain, fever or headache (or Fever >/= 101).     apixaban (ELIQUIS) 5 MG TABS tablet Take 1 tablet (5 mg total) by mouth 2 (two) times daily. 60 tablet 11   diltiazem (CARDIZEM CD) 240 MG 24 hr capsule Take 1 capsule (240 mg total) by mouth daily. 90 capsule 3   pantoprazole (PROTONIX) 40 MG tablet Take 1 tablet (40 mg total) by mouth 2 (two) times daily. 60 tablet 5   tamsulosin (FLOMAX) 0.4 MG CAPS capsule Take 2 capsules (0.8 mg total) by mouth every evening. (Patient taking differently: Take 0.8 mg by mouth daily after breakfast.) 60 capsule 2   No current facility-administered medications for this visit.    Allergies as of 12/11/2022   (No Known Allergies)    Family History  Problem Relation Age of Onset   Hypertension Mother    Heart attack Father    Hyperlipidemia Sister    Hyperlipidemia Brother    Colon polyps Brother    Hypertension Brother    Hypertension Sister    Diabetes Brother    Stroke Other    Diabetes Other    Hypertension Other    Hyperlipidemia Other    Colon cancer Neg Hx     Social History   Socioeconomic History   Marital status: Married    Spouse name: Not on file   Number of children: Not on file   Years of education: Not on file   Highest education level: Not on file  Occupational History   Occupation: Full time  Tobacco Use   Smoking status: Former    Packs/day: 0.50    Years: 15.00    Additional pack years: 0.00    Total pack years: 7.50    Types: Cigarettes    Start date: 05/28/1967    Quit date: 05/27/1984    Years since quitting: 38.5    Passive exposure: Past    Smokeless tobacco: Never  Vaping Use   Vaping Use: Never used  Substance and Sexual Activity   Alcohol use: Not Currently    Alcohol/week: 14.0 standard drinks of alcohol    Types: 14 Glasses of wine per week    Comment: hx of 1 or 2 glasses of wine at night; none currently   Drug use: No   Sexual activity: Not Currently  Other Topics Concern   Not on file  Social History Narrative   Married-2 KIDS(AGE 70 AND 38).   No regular exercise   Social Determinants of Health   Financial Resource Strain: Not on file  Food Insecurity: Not on file  Transportation Needs: Not on file  Physical Activity: Not on file  Stress: Not on file  Social Connections: Not on file    Subjective: Review of Systems  Constitutional:  Positive for malaise/fatigue. Negative for chills and fever.  HENT:  Negative for congestion and hearing loss.   Eyes:  Negative for blurred vision and double vision.  Respiratory:  Negative for cough  and shortness of breath.   Cardiovascular:  Negative for chest pain and palpitations.  Gastrointestinal:  Positive for abdominal pain. Negative for blood in stool, constipation, diarrhea, heartburn, melena and vomiting.  Genitourinary:  Negative for dysuria and urgency.  Musculoskeletal:  Positive for back pain. Negative for joint pain and myalgias.  Skin:  Negative for itching and rash.  Neurological:  Negative for dizziness and headaches.  Psychiatric/Behavioral:  Negative for depression. The patient is not nervous/anxious.      Objective: Ht 5' 9.5" (1.765 m)   Wt 230 lb 6.4 oz (104.5 kg)   BMI 33.54 kg/m  Physical Exam Constitutional:      Appearance: Normal appearance.  HENT:     Head: Normocephalic and atraumatic.  Eyes:     Extraocular Movements: Extraocular movements intact.     Conjunctiva/sclera: Conjunctivae normal.  Cardiovascular:     Rate and Rhythm: Normal rate and regular rhythm.  Pulmonary:     Effort: Pulmonary effort is normal.     Breath  sounds: Normal breath sounds.  Abdominal:     General: Bowel sounds are normal.     Palpations: Abdomen is soft.     Tenderness: There is abdominal tenderness.  Musculoskeletal:        General: Normal range of motion.     Cervical back: Normal range of motion and neck supple.  Skin:    General: Skin is warm.  Neurological:     General: No focal deficit present.     Mental Status: He is alert and oriented to person, place, and time.  Psychiatric:        Mood and Affect: Mood normal.        Behavior: Behavior normal.      Assessment: *Pancreatitis complicated by formation of multiple pseudocysts *Gastric outlet obstruction-due to duodenitis/duodenal stricture in relation to above *Abdominal pain radiating to back-new, worsening *Chronic fatigue *Adenomatous colon polyps  Plan: Patient with worsening fatigue and now with new onset epigastric pain that radiates to back x 3 to 4 weeks.    Will order CT abdomen pelvis with contrast to further evaluate.  Check CBC, CMP, lipase, TSH, B12, vitamin D  Continue on pantoprazole twice daily.  Needs colonoscopy for surveillance purposes which we will schedule today.  Hold Eliquis x 2 days prior to procedure.  The risks including infection, bleed, or perforation as well as benefits, limitations, alternatives and imponderables have been reviewed with the patient. Questions have been answered. All parties agreeable.   May add on upper endoscopy to further evaluate his other symptoms pending CT results.  12/11/2022 1:26 PM   Disclaimer: This note was dictated with voice recognition software. Similar sounding words can inadvertently be transcribed and may not be corrected upon review.

## 2022-12-11 NOTE — Telephone Encounter (Signed)
LMOVM for pt to call back to give CT appt details 4/1, arrival 7:15am, npo 4 hrs prior  Also needs to schedule TCS, ASA 3 in 1 month, Dr. Abbey Chatters, stop eliquis 48 hrs prior

## 2022-12-11 NOTE — Addendum Note (Signed)
Addended by: Cheron Every on: 12/11/2022 02:36 PM   Modules accepted: Orders

## 2022-12-12 ENCOUNTER — Encounter (INDEPENDENT_AMBULATORY_CARE_PROVIDER_SITE_OTHER): Payer: Self-pay | Admitting: *Deleted

## 2022-12-12 LAB — COMPREHENSIVE METABOLIC PANEL
AG Ratio: 1.5 (calc) (ref 1.0–2.5)
ALT: 10 U/L (ref 9–46)
AST: 11 U/L (ref 10–35)
Albumin: 4 g/dL (ref 3.6–5.1)
Alkaline phosphatase (APISO): 79 U/L (ref 35–144)
BUN: 16 mg/dL (ref 7–25)
CO2: 20 mmol/L (ref 20–32)
Calcium: 9.4 mg/dL (ref 8.6–10.3)
Chloride: 106 mmol/L (ref 98–110)
Creat: 0.97 mg/dL (ref 0.70–1.28)
Globulin: 2.7 g/dL (calc) (ref 1.9–3.7)
Glucose, Bld: 122 mg/dL — ABNORMAL HIGH (ref 65–99)
Potassium: 3.8 mmol/L (ref 3.5–5.3)
Sodium: 138 mmol/L (ref 135–146)
Total Bilirubin: 0.5 mg/dL (ref 0.2–1.2)
Total Protein: 6.7 g/dL (ref 6.1–8.1)

## 2022-12-12 LAB — CBC
HCT: 44.5 % (ref 38.5–50.0)
Hemoglobin: 15.1 g/dL (ref 13.2–17.1)
MCH: 30.6 pg (ref 27.0–33.0)
MCHC: 33.9 g/dL (ref 32.0–36.0)
MCV: 90.3 fL (ref 80.0–100.0)
MPV: 9 fL (ref 7.5–12.5)
Platelets: 297 10*3/uL (ref 140–400)
RBC: 4.93 10*6/uL (ref 4.20–5.80)
RDW: 13.1 % (ref 11.0–15.0)
WBC: 6.8 10*3/uL (ref 3.8–10.8)

## 2022-12-12 LAB — B12 AND FOLATE PANEL
Folate: 11.3 ng/mL
Vitamin B-12: 301 pg/mL (ref 200–1100)

## 2022-12-12 LAB — LIPASE: Lipase: 8 U/L (ref 7–60)

## 2022-12-12 LAB — VITAMIN D 25 HYDROXY (VIT D DEFICIENCY, FRACTURES): Vit D, 25-Hydroxy: 31 ng/mL (ref 30–100)

## 2022-12-12 LAB — TSH+FREE T4: TSH W/REFLEX TO FT4: 1.24 mIU/L (ref 0.40–4.50)

## 2022-12-12 MED ORDER — PEG 3350-KCL-NA BICARB-NACL 420 G PO SOLR: 4000.0000 mL | Freq: Once | ORAL | 0 refills | Status: AC

## 2022-12-12 NOTE — Telephone Encounter (Signed)
Called pt. He is aware of CT appt details. He voiced understanding. Scheduled for TCS 4/22 at 1pm. Aware after CT Dr. Abbey Chatters will decide if he needs EGD at that time also. Will send instructions. Rx for prep to pharmacy. Aware to hold eliquis x 48 hrs prior.

## 2022-12-23 ENCOUNTER — Ambulatory Visit (HOSPITAL_COMMUNITY)
Admission: RE | Admit: 2022-12-23 | Discharge: 2022-12-23 | Disposition: A | Discharge status: Home / Self Care | Payer: PPO | Source: Ambulatory Visit | Attending: Internal Medicine | Admitting: Internal Medicine

## 2022-12-23 DIAGNOSIS — K8511 Biliary acute pancreatitis with uninfected necrosis: Secondary | ICD-10-CM | POA: Insufficient documentation

## 2022-12-23 DIAGNOSIS — R1013 Epigastric pain: Secondary | ICD-10-CM | POA: Diagnosis not present

## 2022-12-23 DIAGNOSIS — G8929 Other chronic pain: Secondary | ICD-10-CM | POA: Insufficient documentation

## 2022-12-23 DIAGNOSIS — K4090 Unilateral inguinal hernia, without obstruction or gangrene, not specified as recurrent: Secondary | ICD-10-CM | POA: Diagnosis not present

## 2022-12-23 DIAGNOSIS — K5730 Diverticulosis of large intestine without perforation or abscess without bleeding: Secondary | ICD-10-CM | POA: Diagnosis not present

## 2022-12-23 DIAGNOSIS — R5383 Other fatigue: Secondary | ICD-10-CM | POA: Diagnosis not present

## 2022-12-23 DIAGNOSIS — K8020 Calculus of gallbladder without cholecystitis without obstruction: Secondary | ICD-10-CM | POA: Diagnosis not present

## 2022-12-23 MED ORDER — IOHEXOL 300 MG/ML  SOLN
100.0000 mL | Freq: Once | INTRAMUSCULAR | Status: AC | PRN
  Administered 2022-12-23: 100 mL via INTRAVENOUS

## 2022-12-26 ENCOUNTER — Telehealth: Payer: Self-pay

## 2022-12-26 NOTE — Telephone Encounter (Signed)
Pt called wanting his results and to know what is the next step in his treatment. Please advise

## 2022-12-29 ENCOUNTER — Encounter: Payer: Self-pay | Admitting: Internal Medicine

## 2022-12-30 DIAGNOSIS — M545 Low back pain, unspecified: Secondary | ICD-10-CM | POA: Diagnosis not present

## 2022-12-30 DIAGNOSIS — I482 Chronic atrial fibrillation, unspecified: Secondary | ICD-10-CM | POA: Diagnosis not present

## 2022-12-30 DIAGNOSIS — I10 Essential (primary) hypertension: Secondary | ICD-10-CM | POA: Diagnosis not present

## 2022-12-30 DIAGNOSIS — H18221 Idiopathic corneal edema, right eye: Secondary | ICD-10-CM | POA: Diagnosis not present

## 2022-12-30 DIAGNOSIS — Z87891 Personal history of nicotine dependence: Secondary | ICD-10-CM | POA: Diagnosis not present

## 2022-12-30 DIAGNOSIS — I700 Atherosclerosis of aorta: Secondary | ICD-10-CM | POA: Diagnosis not present

## 2022-12-30 DIAGNOSIS — E669 Obesity, unspecified: Secondary | ICD-10-CM | POA: Diagnosis not present

## 2022-12-30 DIAGNOSIS — K8591 Acute pancreatitis with uninfected necrosis, unspecified: Secondary | ICD-10-CM | POA: Diagnosis not present

## 2022-12-30 DIAGNOSIS — H409 Unspecified glaucoma: Secondary | ICD-10-CM | POA: Diagnosis not present

## 2022-12-30 DIAGNOSIS — R3915 Urgency of urination: Secondary | ICD-10-CM | POA: Diagnosis not present

## 2022-12-30 DIAGNOSIS — R7303 Prediabetes: Secondary | ICD-10-CM | POA: Diagnosis not present

## 2022-12-30 DIAGNOSIS — K219 Gastro-esophageal reflux disease without esophagitis: Secondary | ICD-10-CM | POA: Diagnosis not present

## 2023-01-01 DIAGNOSIS — Z961 Presence of intraocular lens: Secondary | ICD-10-CM | POA: Diagnosis not present

## 2023-01-01 DIAGNOSIS — Z947 Corneal transplant status: Secondary | ICD-10-CM | POA: Diagnosis not present

## 2023-01-01 DIAGNOSIS — H2512 Age-related nuclear cataract, left eye: Secondary | ICD-10-CM | POA: Diagnosis not present

## 2023-01-01 DIAGNOSIS — H26491 Other secondary cataract, right eye: Secondary | ICD-10-CM | POA: Diagnosis not present

## 2023-01-01 DIAGNOSIS — H21261 Iris atrophy (essential) (progressive), right eye: Secondary | ICD-10-CM | POA: Diagnosis not present

## 2023-01-01 DIAGNOSIS — Z9841 Cataract extraction status, right eye: Secondary | ICD-10-CM | POA: Diagnosis not present

## 2023-01-01 DIAGNOSIS — H1820 Unspecified corneal edema: Secondary | ICD-10-CM | POA: Diagnosis not present

## 2023-01-01 DIAGNOSIS — H4051X0 Glaucoma secondary to other eye disorders, right eye, stage unspecified: Secondary | ICD-10-CM | POA: Diagnosis not present

## 2023-01-07 NOTE — Telephone Encounter (Signed)
Phoned and advised the pt of his CT report note / lab number. Pt states he is a lot better with his pain. He went to see Dr Margo Aye and he was prescribed muscle relaxer's and he is feeling much better. He states his pain level is a 2 now. I also advised if this gets back like it was in the beginning to give [place] a call and we will get him evaluated for EGD. Pt expressed understanding

## 2023-01-07 NOTE — Telephone Encounter (Signed)
Sean Golden, Can you read this pt's CT results from 4/01 and advise me what to tell the pt.

## 2023-01-07 NOTE — Telephone Encounter (Signed)
Pt is advised to call when he is ready to do his colonoscopy

## 2023-01-07 NOTE — Telephone Encounter (Signed)
Resolution of prior fluid collections. Possible subtle focal pancreatitis changes at pancreatic head. Serum lipase normal 3/20. HFP normal 3/20. Doubt dealing with recurrent pancreatitis but unable to exclude a chronic component.   How is he feeling? Is epigastric pain continuing? Worsening? Recommend EGD if persistent epigastric pain.

## 2023-01-07 NOTE — Telephone Encounter (Signed)
Noted. Great to hear! I believe he is due for colonoscopy as per last plan. Please have him call when ready to do that.

## 2023-01-08 ENCOUNTER — Other Ambulatory Visit (HOSPITAL_COMMUNITY): Payer: PPO

## 2023-01-13 ENCOUNTER — Encounter (HOSPITAL_COMMUNITY): Payer: Self-pay

## 2023-01-13 ENCOUNTER — Ambulatory Visit (HOSPITAL_COMMUNITY): Admit: 2023-01-13 | Payer: PPO

## 2023-01-13 SURGERY — COLONOSCOPY WITH PROPOFOL
Anesthesia: Monitor Anesthesia Care

## 2023-01-15 DIAGNOSIS — E782 Mixed hyperlipidemia: Secondary | ICD-10-CM | POA: Diagnosis not present

## 2023-01-15 DIAGNOSIS — R7303 Prediabetes: Secondary | ICD-10-CM | POA: Diagnosis not present

## 2023-01-15 DIAGNOSIS — Z125 Encounter for screening for malignant neoplasm of prostate: Secondary | ICD-10-CM | POA: Diagnosis not present

## 2023-01-20 DIAGNOSIS — Z0000 Encounter for general adult medical examination without abnormal findings: Secondary | ICD-10-CM | POA: Diagnosis not present

## 2023-01-21 DIAGNOSIS — D649 Anemia, unspecified: Secondary | ICD-10-CM | POA: Diagnosis not present

## 2023-01-21 DIAGNOSIS — G4733 Obstructive sleep apnea (adult) (pediatric): Secondary | ICD-10-CM | POA: Diagnosis not present

## 2023-01-21 DIAGNOSIS — I10 Essential (primary) hypertension: Secondary | ICD-10-CM | POA: Diagnosis not present

## 2023-01-21 DIAGNOSIS — E782 Mixed hyperlipidemia: Secondary | ICD-10-CM | POA: Diagnosis not present

## 2023-01-21 DIAGNOSIS — R3915 Urgency of urination: Secondary | ICD-10-CM | POA: Diagnosis not present

## 2023-01-21 DIAGNOSIS — K219 Gastro-esophageal reflux disease without esophagitis: Secondary | ICD-10-CM | POA: Diagnosis not present

## 2023-01-21 DIAGNOSIS — D6869 Other thrombophilia: Secondary | ICD-10-CM | POA: Diagnosis not present

## 2023-01-21 DIAGNOSIS — I482 Chronic atrial fibrillation, unspecified: Secondary | ICD-10-CM | POA: Diagnosis not present

## 2023-01-21 DIAGNOSIS — R7303 Prediabetes: Secondary | ICD-10-CM | POA: Diagnosis not present

## 2023-01-21 DIAGNOSIS — K8501 Idiopathic acute pancreatitis with uninfected necrosis: Secondary | ICD-10-CM | POA: Diagnosis not present

## 2023-01-21 DIAGNOSIS — H409 Unspecified glaucoma: Secondary | ICD-10-CM | POA: Diagnosis not present

## 2023-01-21 DIAGNOSIS — H18221 Idiopathic corneal edema, right eye: Secondary | ICD-10-CM | POA: Diagnosis not present

## 2023-01-27 DIAGNOSIS — G4733 Obstructive sleep apnea (adult) (pediatric): Secondary | ICD-10-CM | POA: Diagnosis not present

## 2023-02-05 DIAGNOSIS — H26491 Other secondary cataract, right eye: Secondary | ICD-10-CM | POA: Diagnosis not present

## 2023-02-24 DIAGNOSIS — R040 Epistaxis: Secondary | ICD-10-CM | POA: Diagnosis not present

## 2023-02-25 ENCOUNTER — Encounter (HOSPITAL_COMMUNITY): Payer: Self-pay

## 2023-02-25 ENCOUNTER — Emergency Department (HOSPITAL_COMMUNITY)
Admission: EM | Admit: 2023-02-25 | Discharge: 2023-02-25 | Disposition: A | Discharge status: Home / Self Care | Payer: PPO | Attending: Emergency Medicine | Admitting: Emergency Medicine

## 2023-02-25 ENCOUNTER — Other Ambulatory Visit: Payer: Self-pay

## 2023-02-25 DIAGNOSIS — Z7901 Long term (current) use of anticoagulants: Secondary | ICD-10-CM | POA: Insufficient documentation

## 2023-02-25 DIAGNOSIS — I10 Essential (primary) hypertension: Secondary | ICD-10-CM | POA: Diagnosis not present

## 2023-02-25 DIAGNOSIS — R040 Epistaxis: Secondary | ICD-10-CM | POA: Diagnosis not present

## 2023-02-25 LAB — COMPREHENSIVE METABOLIC PANEL
ALT: 14 U/L (ref 0–44)
AST: 14 U/L — ABNORMAL LOW (ref 15–41)
Albumin: 3.7 g/dL (ref 3.5–5.0)
Alkaline Phosphatase: 68 U/L (ref 38–126)
Anion gap: 10 (ref 5–15)
BUN: 17 mg/dL (ref 8–23)
CO2: 21 mmol/L — ABNORMAL LOW (ref 22–32)
Calcium: 8.8 mg/dL — ABNORMAL LOW (ref 8.9–10.3)
Chloride: 104 mmol/L (ref 98–111)
Creatinine, Ser: 0.95 mg/dL (ref 0.61–1.24)
GFR, Estimated: >60 mL/min (ref >60)
Glucose, Bld: 113 mg/dL — ABNORMAL HIGH (ref 70–99)
Potassium: 4 mmol/L (ref 3.5–5.1)
Sodium: 135 mmol/L (ref 135–145)
Total Bilirubin: 0.7 mg/dL (ref 0.3–1.2)
Total Protein: 6.6 g/dL (ref 6.5–8.1)

## 2023-02-25 LAB — CBC WITH DIFFERENTIAL/PLATELET
Abs Immature Granulocytes: 0.07 10*3/uL (ref 0.00–0.07)
Basophils Absolute: 0.1 10*3/uL (ref 0.0–0.1)
Basophils Relative: 1 %
Eosinophils Absolute: 0.2 10*3/uL (ref 0.0–0.5)
Eosinophils Relative: 2 %
HCT: 44.4 % (ref 39.0–52.0)
Hemoglobin: 15.1 g/dL (ref 13.0–17.0)
Immature Granulocytes: 1 %
Lymphocytes Relative: 27 %
Lymphs Abs: 2.9 10*3/uL (ref 0.7–4.0)
MCH: 31.5 pg (ref 26.0–34.0)
MCHC: 34 g/dL (ref 30.0–36.0)
MCV: 92.7 fL (ref 80.0–100.0)
Monocytes Absolute: 1.1 10*3/uL — ABNORMAL HIGH (ref 0.1–1.0)
Monocytes Relative: 11 %
Neutro Abs: 6.3 10*3/uL (ref 1.7–7.7)
Neutrophils Relative %: 58 %
Platelets: 283 10*3/uL (ref 150–400)
RBC: 4.79 MIL/uL (ref 4.22–5.81)
RDW: 13 % (ref 11.5–15.5)
WBC: 10.6 10*3/uL — ABNORMAL HIGH (ref 4.0–10.5)
nRBC: 0 % (ref 0.0–0.2)

## 2023-02-25 LAB — PROTIME-INR
INR: 1.2 (ref 0.8–1.2)
Prothrombin Time: 14.9 seconds (ref 11.4–15.2)

## 2023-02-25 MED ORDER — TRANEXAMIC ACID FOR EPISTAXIS
500.0000 mg | Freq: Once | TOPICAL | Status: AC
  Administered 2023-02-25: 500 mg via TOPICAL
  Filled 2023-02-25: qty 10

## 2023-02-25 NOTE — Discharge Instructions (Signed)
Nasal packing in place.  Come back if you have recurrent bleeding, pain, fever or any other new or worsening symptoms.  Follow-up with ENT on Friday for removal

## 2023-02-25 NOTE — ED Triage Notes (Signed)
Pt arrived via POV from home c/o epistaxis X 4 hrs. Pt reports Hx of frequent nose bleeds and had a cauterization procedure performed yesterday by his ENT in [place]. Pt is on Eliquis.

## 2023-02-25 NOTE — ED Provider Notes (Signed)
[place] EMERGENCY DEPARTMENT AT [facility] Provider Note   CSN: [id number] Arrival date & time: 02/25/23  2032     History  Chief Complaint  Patient presents with   Epistaxis    Sean Golden is a 77 y.o. male.  He has past medical history of cataracts, atherosclerosis of the aorta, pancreatitis, hypertension, A-fib.  His Eliquis.  Presents the ER today for continuous bleeding from the right nares.  Had this problem in the past, was at ENT yesterday and they did a cauterization.  He states started having bleeding on and off all day today, became constant at 5 PM.  He tried ice, pressure and Afrin.  He called his ENT doctor who called him back and told him that they would likely need packing and wait appointment for removal on Friday.   Epistaxis      Home Medications Prior to Admission medications   Medication Sig Start Date End Date Taking? Authorizing Provider  apixaban (ELIQUIS) 5 MG TABS tablet Take 1 tablet (5 mg total) by mouth 2 (two) times daily. 03/01/22  Yes Strader, [place] M, PA-C  diltiazem (CARDIZEM CD) 240 MG 24 hr capsule Take 1 capsule (240 mg total) by mouth daily. 03/01/22  Yes Strader, [place] M, PA-C  pantoprazole (PROTONIX) 40 MG tablet Take 1 tablet (40 mg total) by mouth 2 (two) times daily. 12/11/22 12/11/23 Yes Carver, Hennie Duos, DO  acetaminophen (TYLENOL) 325 MG tablet Take 2 tablets (650 mg total) by mouth every 6 (six) hours as needed for mild pain, fever or headache (or Fever >/= 101). 11/12/21   Johnson, Clanford L, MD  tamsulosin (FLOMAX) 0.4 MG CAPS capsule Take 2 capsules (0.8 mg total) by mouth every evening. Patient taking differently: Take 0.8 mg by mouth daily after breakfast. 11/12/21   Cleora Fleet, MD      Allergies    Patient has no known allergies.    Review of Systems   Review of Systems  HENT:  Positive for nosebleeds.     Physical Exam Updated Vital Signs BP (!) 141/96   Pulse 86   Temp 97.8 F (36.6 C)  (Oral)   Resp 20   Ht 5' 9.5" (1.765 m)   Wt 103 kg   SpO2 95%   BMI 33.04 kg/m  Physical Exam Vitals and nursing note reviewed.  Constitutional:      General: He is not in acute distress.    Appearance: He is well-developed.  HENT:     Head: Normocephalic and atraumatic.     Nose:     Right Nostril: Epistaxis present. No septal hematoma.     Left Nostril: No epistaxis or septal hematoma.  Eyes:     Conjunctiva/sclera: Conjunctivae normal.  Cardiovascular:     Rate and Rhythm: Normal rate and regular rhythm.     Heart sounds: No murmur heard. Pulmonary:     Effort: Pulmonary effort is normal. No respiratory distress.     Breath sounds: Normal breath sounds.  Abdominal:     Palpations: Abdomen is soft.     Tenderness: There is no abdominal tenderness.  Musculoskeletal:        General: No swelling.     Cervical back: Neck supple.  Skin:    General: Skin is warm and dry.     Capillary Refill: Capillary refill takes less than 2 seconds.  Neurological:     General: No focal deficit present.     Mental Status: He is  alert and oriented to person, place, and time.  Psychiatric:        Mood and Affect: Mood normal.     ED Results / Procedures / Treatments   Labs (all labs ordered are listed, but only abnormal results are displayed) Labs Reviewed  CBC WITH DIFFERENTIAL/PLATELET - Abnormal; Notable for the following components:      Result Value   WBC 10.6 (*)    Monocytes Absolute 1.1 (*)    All other components within normal limits  COMPREHENSIVE METABOLIC PANEL - Abnormal; Notable for the following components:   CO2 21 (*)    Glucose, Bld 113 (*)    Calcium 8.8 (*)    AST 14 (*)    All other components within normal limits  PROTIME-INR    EKG None  Radiology No results found.  Procedures .Epistaxis Management  Date/Time: 02/25/2023 10:24 PM  Performed by: Ma Rings, PA-C Authorized by: Ma Rings, PA-C   Consent:    Consent obtained:   Verbal   Consent given by:  Patient   Risks, benefits, and alternatives were discussed: yes     Risks discussed:  Bleeding, nasal injury, infection and pain Universal protocol:    Patient identity confirmed:  Verbally with patient Procedure details:    Treatment site:  R anterior and R posterior   Treatment method:  Nasal balloon (7.5 cm anterior posterior rhino rocket)   Treatment complexity:  Limited   Treatment episode: initial   Post-procedure details:    Assessment:  Bleeding stopped   Procedure completion:  Tolerated well, no immediate complications     Medications Ordered in ED Medications  tranexamic acid (CYKLOKAPRON) 1000 MG/10ML topical solution 500 mg (500 mg Topical Given 02/25/23 2205)    ED Course/ Medical Decision Making/ A&P                             Medical Decision Making Differential Diagnosis: Spontaneous epistaxis, traumatic epistaxis, sinusitis, coagulopathy, anemia, other  Course: Patient presents with right epistaxis, had nasal cautery a ENT yesterday and had continuous bleeding all day today, worse since 5 PM, refractory to home treatments including Afrin, pressure and ice.  He already called his ENT and they have made an appointment for Friday to remove any nasal packing, bleeding resolved with Rhino Rocket.  Given continuous bleeding and that has been copious amounts of blood, I ordered  labs, hemoglobin stable at 15, mild leukocytosis 10.6, CMP reassuring, CO2 slightly decreased at 21 but this is around his baseline, PT INR is normal  Patient advised keep the packing in place, come back for new or worsening symptoms  Amount and/or Complexity of Data Reviewed Labs: ordered.           Final Clinical Impression(s) / ED Diagnoses Final diagnoses:  Right-sided epistaxis    Rx / DC Orders ED Discharge Orders     None         Ma Rings, PA-C 02/25/23 2231    Pricilla Loveless, MD 02/25/23 2352

## 2023-02-25 NOTE — ED Notes (Signed)
Pt presents to ED room 18 ambulatory, HTN noted with known hx

## 2023-02-28 DIAGNOSIS — R040 Epistaxis: Secondary | ICD-10-CM | POA: Diagnosis not present

## 2023-03-24 DIAGNOSIS — R040 Epistaxis: Secondary | ICD-10-CM | POA: Diagnosis not present

## 2023-03-26 ENCOUNTER — Other Ambulatory Visit: Payer: Self-pay | Admitting: Student

## 2023-03-26 NOTE — Telephone Encounter (Signed)
Prescription refill request for Eliquis received. Indication:AFIB Last office visit:1/24 Scr:0.95  6/24 Age: 77 Weight:103  KG  Prescription refilled

## 2023-03-27 ENCOUNTER — Encounter (HOSPITAL_COMMUNITY): Payer: Self-pay

## 2023-03-27 ENCOUNTER — Other Ambulatory Visit: Payer: Self-pay

## 2023-03-27 ENCOUNTER — Emergency Department (HOSPITAL_COMMUNITY)
Admission: EM | Admit: 2023-03-27 | Discharge: 2023-03-28 | Disposition: A | Discharge status: Home / Self Care | Payer: PPO | Attending: Emergency Medicine | Admitting: Emergency Medicine

## 2023-03-27 DIAGNOSIS — R040 Epistaxis: Secondary | ICD-10-CM | POA: Diagnosis not present

## 2023-03-27 MED ORDER — TRANEXAMIC ACID FOR EPISTAXIS
500.0000 mg | Freq: Once | TOPICAL | Status: AC
  Administered 2023-03-28: 500 mg via TOPICAL
  Filled 2023-03-27: qty 10

## 2023-03-27 MED ORDER — LIDOCAINE-EPINEPHRINE-TETRACAINE (LET) TOPICAL GEL
3.0000 mL | Freq: Once | TOPICAL | Status: AC
  Administered 2023-03-27: 3 mL via TOPICAL
  Filled 2023-03-27: qty 3

## 2023-03-27 NOTE — ED Provider Notes (Signed)
[place] EMERGENCY DEPARTMENT AT [facility] Provider Note   CSN: [id number] Arrival date & time: 03/27/23  2138     History  Chief Complaint  Patient presents with   Epistaxis    Sean Golden is a 77 y.o. male.  Presents to the emergency department for evaluation of nosebleed.  Patient has had recurrent nosebleed, underwent cauterization procedure by his ENT doctor in [place] earlier this week.  Patient reports that the scab came off tonight and he started bleeding again.  Blood primarily from the right side of his nose, although it has been fairly brisk and he is experiencing some bleeding out of the left as well.  No new injury.       Home Medications Prior to Admission medications   Medication Sig Start Date End Date Taking? Authorizing Provider  acetaminophen (TYLENOL) 325 MG tablet Take 2 tablets (650 mg total) by mouth every 6 (six) hours as needed for mild pain, fever or headache (or Fever >/= 101). 11/12/21   Johnson, Clanford L, MD  diltiazem (CARDIZEM CD) 240 MG 24 hr capsule Take 1 capsule (240 mg total) by mouth daily. 03/01/22   Strader, Lennart Pall, PA-C  ELIQUIS 5 MG TABS tablet TAKE ONE TABLET (5MG  TOTAL) BY MOUTH TWOTIMES DAILY 03/26/23   [place] Ouch, Lennart Pall, PA-C  pantoprazole (PROTONIX) 40 MG tablet Take 1 tablet (40 mg total) by mouth 2 (two) times daily. 12/11/22 12/11/23  Lanelle Bal, DO  tamsulosin (FLOMAX) 0.4 MG CAPS capsule Take 2 capsules (0.8 mg total) by mouth every evening. Patient taking differently: Take 0.8 mg by mouth daily after breakfast. 11/12/21   Cleora Fleet, MD      Allergies    Patient has no known allergies.    Review of Systems   Review of Systems  Physical Exam Updated Vital Signs BP (!) 140/91   Pulse 94   Resp 20   Ht 5' 9.5" (1.765 m)   Wt 103 kg   SpO2 94%   BMI 33.04 kg/m  Physical Exam Vitals and nursing note reviewed.  Constitutional:      General: He is not in acute distress.    Appearance:  He is well-developed.  HENT:     Head: Normocephalic and atraumatic.     Nose:     Right Nostril: Epistaxis present.     Mouth/Throat:     Mouth: Mucous membranes are moist.  Eyes:     General: Vision grossly intact. Gaze aligned appropriately.     Extraocular Movements: Extraocular movements intact.     Conjunctiva/sclera: Conjunctivae normal.  Cardiovascular:     Rate and Rhythm: Normal rate and regular rhythm.     Pulses: Normal pulses.     Heart sounds: Normal heart sounds, S1 normal and S2 normal. No murmur heard.    No friction rub. No gallop.  Pulmonary:     Effort: Pulmonary effort is normal. No respiratory distress.     Breath sounds: Normal breath sounds.  Abdominal:     Palpations: Abdomen is soft.     Tenderness: There is no abdominal tenderness. There is no guarding or rebound.     Hernia: No hernia is present.  Musculoskeletal:        General: No swelling.     Cervical back: Full passive range of motion without pain, normal range of motion and neck supple. No pain with movement, spinous process tenderness or muscular tenderness. Normal range of motion.  Right lower leg: No edema.     Left lower leg: No edema.  Skin:    General: Skin is warm and dry.     Capillary Refill: Capillary refill takes less than 2 seconds.     Findings: No ecchymosis, erythema, lesion or wound.  Neurological:     Mental Status: He is alert and oriented to person, place, and time.     GCS: GCS eye subscore is 4. GCS verbal subscore is 5. GCS motor subscore is 6.     Cranial Nerves: Cranial nerves 2-12 are intact.     Sensory: Sensation is intact.     Motor: Motor function is intact. No weakness or abnormal muscle tone.     Coordination: Coordination is intact.  Psychiatric:        Mood and Affect: Mood normal.        Speech: Speech normal.        Behavior: Behavior normal.     ED Results / Procedures / Treatments   Labs (all labs ordered are listed, but only abnormal results are  displayed) Labs Reviewed  CBC    EKG None  Radiology No results found.  Procedures .Epistaxis Management  Date/Time: 03/28/2023 12:23 AM  Performed by: Gilda Crease, MD Authorized by: Gilda Crease, MD   Consent:    Consent obtained:  Verbal   Consent given by:  Patient   Risks, benefits, and alternatives were discussed: yes     Risks discussed:  Bleeding and pain Universal protocol:    Procedure explained and questions answered to patient or proxy's satisfaction: yes     Relevant documents present and verified: yes     Test results available: yes     Imaging studies available: yes     Required blood products, implants, devices, and special equipment available: yes     Site/side marked: yes     Immediately prior to procedure, a time out was called: yes     Patient identity confirmed:  Verbally with patient Anesthesia:    Anesthesia method:  Topical application   Topical anesthetic:  LET Procedure details:    Treatment site:  R anterior   Treatment method:  Nasal balloon   Treatment complexity:  Limited   Treatment episode: recurring   Post-procedure details:    Assessment:  Bleeding decreased   Procedure completion:  Tolerated well, no immediate complications Comments:     Rapid Rhino nasal balloon was soaked in TXA and additional TXA injected around the balloon before it was blown up     Medications Ordered in ED Medications  lidocaine-EPINEPHrine-tetracaine (LET) topical gel (3 mLs Topical Given 03/27/23 2330)  tranexamic acid (CYKLOKAPRON) 1000 MG/10ML topical solution 500 mg (500 mg Topical Given by Other 03/28/23 9604)    ED Course/ Medical Decision Making/ A&P                             Medical Decision Making Amount and/or Complexity of Data Reviewed Labs: ordered.   Presents to the emergency department for evaluation of recurrent nosebleed.  Patient is on Eliquis.  Patient continues on Eliquis, has had multiple nosebleeds in the  past.  Patient underwent cauterization earlier this week by ENT.  This is likely the area of bleeding, patient reports that the scab fell off.  Based on the amount of bleeding, recurrent nature and Eliquis use, I did not feel that bedside treatment of his nosebleed would  be useful.  I recommended rapid Rhino packing, which he has had in the past, and he did consent.  This was performed without difficulty.  Hemoglobin is stable.  Patient had some residual bleeding initially after packing but this has now stopped.  He has been monitored, no further bleeding, will discharge with packing in place, follow-up with ENT.  Antibiotics are no longer recommended with nasal packing.        Final Clinical Impression(s) / ED Diagnoses Final diagnoses:  Epistaxis    Rx / DC Orders ED Discharge Orders     None         , Canary Brim, MD 03/28/23 [id number]

## 2023-03-27 NOTE — ED Triage Notes (Signed)
Pt has had nose bleed for 30 minutes.  Pt is actively bleeding during triage.  States the scab came off today. Pt is on blood thinners.

## 2023-03-28 LAB — CBC
HCT: 42.2 % (ref 39.0–52.0)
Hemoglobin: 14 g/dL (ref 13.0–17.0)
MCH: 31 pg (ref 26.0–34.0)
MCHC: 33.2 g/dL (ref 30.0–36.0)
MCV: 93.4 fL (ref 80.0–100.0)
Platelets: 255 10*3/uL (ref 150–400)
RBC: 4.52 MIL/uL (ref 4.22–5.81)
RDW: 13 % (ref 11.5–15.5)
WBC: 8.4 10*3/uL (ref 4.0–10.5)
nRBC: 0 % (ref 0.0–0.2)

## 2023-03-28 NOTE — ED Notes (Signed)
Rhino rocket in place in R nostril. No bleeding leaking around rocket. Sean Golden

## 2023-03-28 NOTE — Discharge Instructions (Addendum)
Return to the emergency department if bleeding worsens.  Schedule follow-up with your ENT doctor next week for packing removal.

## 2023-04-01 ENCOUNTER — Encounter: Payer: Self-pay | Admitting: Cardiology

## 2023-04-01 DIAGNOSIS — R040 Epistaxis: Secondary | ICD-10-CM | POA: Diagnosis not present

## 2023-04-02 ENCOUNTER — Encounter (HOSPITAL_COMMUNITY): Payer: Self-pay | Admitting: Emergency Medicine

## 2023-04-02 ENCOUNTER — Emergency Department (HOSPITAL_COMMUNITY)
Admission: EM | Admit: 2023-04-02 | Discharge: 2023-04-02 | Disposition: A | Discharge status: Home / Self Care | Payer: PPO | Attending: Student | Admitting: Student

## 2023-04-02 ENCOUNTER — Other Ambulatory Visit: Payer: Self-pay

## 2023-04-02 DIAGNOSIS — I4891 Unspecified atrial fibrillation: Secondary | ICD-10-CM | POA: Insufficient documentation

## 2023-04-02 DIAGNOSIS — R040 Epistaxis: Secondary | ICD-10-CM | POA: Diagnosis not present

## 2023-04-02 DIAGNOSIS — Z7901 Long term (current) use of anticoagulants: Secondary | ICD-10-CM | POA: Diagnosis not present

## 2023-04-02 LAB — CBC
HCT: 41.6 % (ref 39.0–52.0)
Hemoglobin: 14.2 g/dL (ref 13.0–17.0)
MCH: 31.1 pg (ref 26.0–34.0)
MCHC: 34.1 g/dL (ref 30.0–36.0)
MCV: 91.2 fL (ref 80.0–100.0)
Platelets: 292 10*3/uL (ref 150–400)
RBC: 4.56 MIL/uL (ref 4.22–5.81)
RDW: 12.8 % (ref 11.5–15.5)
WBC: 8.9 10*3/uL (ref 4.0–10.5)
nRBC: 0 % (ref 0.0–0.2)

## 2023-04-02 LAB — PROTIME-INR
INR: 1.1 (ref 0.8–1.2)
Prothrombin Time: 14.9 seconds (ref 11.4–15.2)

## 2023-04-02 MED ORDER — LIDOCAINE-EPINEPHRINE-TETRACAINE (LET) TOPICAL GEL
3.0000 mL | Freq: Once | TOPICAL | Status: AC
  Administered 2023-04-02: 3 mL via TOPICAL
  Filled 2023-04-02: qty 3

## 2023-04-02 MED ORDER — TRANEXAMIC ACID FOR EPISTAXIS
500.0000 mg | Freq: Once | TOPICAL | Status: AC
  Administered 2023-04-02: 500 mg via TOPICAL
  Filled 2023-04-02: qty 10

## 2023-04-02 MED ORDER — OXYMETAZOLINE HCL 0.05 % NA SOLN: 1.0000 | Freq: Once | NASAL | Status: DC

## 2023-04-02 NOTE — Discharge Instructions (Signed)
Follow up with your ENT doctor, and follow up with cardiology about possibly reducing your eliquis dosage. You will need to have the nasal packing removed in about 3 days. Call ENT about follow up for removal.   Come back to the ER for new or worsening symptoms.

## 2023-04-02 NOTE — Telephone Encounter (Signed)
Dr. Avel Sensor office contacted to clarify recommendations for eliquis spoke with Herbert Seta, CMA and advised if [place]'s recommendations of a temporary reduction of eliquis to 2.5 mg twice daily. Will send to patient through mychart as well.

## 2023-04-02 NOTE — ED Triage Notes (Signed)
Pt via POV with severe nose bleed since this morning. He was treated twice in the past month for same with nasal packing removed yesterday at ENT who also performed a small cautery procedure. Pt presents with heavy, constant nosebleed and gagging due to clots in the back of his throat. Pt denies pain, SOB, dizziness. Pt takes eliquis daily; he was advised yesterday to discuss lowering the dosage but has not heard back from cardiology.

## 2023-04-02 NOTE — ED Provider Notes (Signed)
[place] EMERGENCY DEPARTMENT AT [facility] Provider Note   CSN: [id number] Arrival date & time: 04/02/23  1242     History  Chief Complaint  Patient presents with   Epistaxis    Sean Golden is a 77 y.o. male.  History of A-fib on Eliquis.  The ER for right-sided nosebleed.  Is having this intermittently for the past couple of weeks, has had to have nasal packing on July 4 which was just removed by ENT yesterday.  He states they cauterized another spot and he woke up this morning with a nosebleed that he was able to get it to stop with some pressure.  This nosebleed started at noon, he has not been able to stop.  Denies any dizziness or shortness of breath or other complaints.  History of pressure without any relief.  His ENT doctor had suggested potentially decreasing his Eliquis dose and advise he contact his cardiologist but he has not heard back yet.   Epistaxis      Home Medications Prior to Admission medications   Medication Sig Start Date End Date Taking? Authorizing Provider  acetaminophen (TYLENOL) 325 MG tablet Take 2 tablets (650 mg total) by mouth every 6 (six) hours as needed for mild pain, fever or headache (or Fever >/= 101). 11/12/21   Johnson, Clanford L, MD  diltiazem (CARDIZEM CD) 240 MG 24 hr capsule Take 1 capsule (240 mg total) by mouth daily. 03/01/22   Strader, Lennart Pall, PA-C  ELIQUIS 5 MG TABS tablet TAKE ONE TABLET (5MG  TOTAL) BY MOUTH TWOTIMES DAILY 03/26/23   [place] Ouch, Lennart Pall, PA-C  pantoprazole (PROTONIX) 40 MG tablet Take 1 tablet (40 mg total) by mouth 2 (two) times daily. 12/11/22 12/11/23  Lanelle Bal, DO  tamsulosin (FLOMAX) 0.4 MG CAPS capsule Take 2 capsules (0.8 mg total) by mouth every evening. Patient taking differently: Take 0.8 mg by mouth daily after breakfast. 11/12/21   Cleora Fleet, MD      Allergies    Patient has no known allergies.    Review of Systems   Review of Systems  HENT:  Positive for nosebleeds.      Physical Exam Updated Vital Signs BP (!) 153/104   Pulse 87   Temp 97.7 F (36.5 C)   Resp 16   Ht 5' 9.5" (1.765 m)   Wt 103 kg   SpO2 96%   BMI 33.04 kg/m  Physical Exam Vitals and nursing note reviewed.  Constitutional:      General: He is not in acute distress.    Appearance: He is well-developed.  HENT:     Head: Normocephalic and atraumatic.     Nose:     Right Nostril: Epistaxis present. No septal hematoma.     Left Nostril: No epistaxis.     Comments: Noted area on the septum of the right naris that there is area of prior cauterization, appears to be the area of bleeding Eyes:     Conjunctiva/sclera: Conjunctivae normal.  Cardiovascular:     Rate and Rhythm: Normal rate and regular rhythm.     Heart sounds: No murmur heard. Pulmonary:     Effort: Pulmonary effort is normal. No respiratory distress.     Breath sounds: Normal breath sounds.  Abdominal:     Palpations: Abdomen is soft.     Tenderness: There is no abdominal tenderness.  Musculoskeletal:        General: No swelling.     Cervical back:  Neck supple.  Skin:    General: Skin is warm and dry.     Capillary Refill: Capillary refill takes less than 2 seconds.  Neurological:     General: No focal deficit present.     Mental Status: He is alert and oriented to person, place, and time.     Motor: No weakness.  Psychiatric:        Mood and Affect: Mood normal.     ED Results / Procedures / Treatments   Labs (all labs ordered are listed, but only abnormal results are displayed) Labs Reviewed  PROTIME-INR  CBC    EKG None  Radiology No results found.  Procedures .Epistaxis Management  Date/Time: 04/02/2023 2:07 PM  Performed by: Ma Rings, PA-C Authorized by: Ma Rings, PA-C   Consent:    Consent obtained:  Verbal   Consent given by:  Patient   Risks discussed:  Bleeding, infection and pain Universal protocol:    Patient identity confirmed:  Verbally with  patient Anesthesia:    Anesthesia method:  Topical application   Topical anesthetic:  LET Procedure details:    Treatment site:  R anterior   Treatment method:  Nasal balloon   Treatment complexity:  Limited   Treatment episode: recurring   Post-procedure details:    Assessment:  Bleeding stopped   Procedure completion:  Tolerated well, no immediate complications Comments:     Rhino Rocket was soaked in TXA prior to insertion     Medications Ordered in ED Medications  oxymetazoline (AFRIN) 0.05 % nasal spray 1 spray (1 spray Each Nare Not Given 04/02/23 1451)  lidocaine-EPINEPHrine-tetracaine (LET) topical gel (3 mLs Topical Given by Other 04/02/23 1345)  tranexamic acid (CYKLOKAPRON) 1000 MG/10ML topical solution 500 mg (500 mg Topical Given by Other 04/02/23 1345)    ED Course/ Medical Decision Making/ A&P                             Medical Decision Making This patient presents to the ED for concern of right-sided epistaxis, this involves an extensive number of treatment options, and is a complaint that carries with it a high risk of complications and morbidity.  The differential diagnosis includes epistaxis, coagulopathy, anemia,  other   Co morbidities that complicate the patient evaluation :   afib on eliquis   Additional history obtained:  Additional history obtained from EMR External records from outside source obtained and reviewed including notes   Lab Tests:  I Ordered, and personally interpreted labs.  The pertinent results include: CBC is normal, INR is normal     Problem List / ED Course / Critical interventions / Medication management  Send epistaxis.  Resolved after [employer].  Labs are normal.  Patient is following up with ENT, ENT requested cardiology potentially reduce patient's Eliquis dosage.  Patient sent message to his cardiologist office, per the notes the cardiologist is going to reach out to ENT to see if this going to be a temporary versus  permanent reduction and they will weigh risks and benefits of stroke risk for this.  Patient to follow-up with these 2 specialists, given strict return precautions. I ordered medication including txa and LET  for local anesthesia and hemorrhage control with Rhino Rocket Reevaluation of the patient after these medicines showed that the patient resolved I have reviewed the patients home medicines and have made adjustments as needed      Amount and/or  Complexity of Data Reviewed Labs: ordered.  Risk OTC drugs.           Final Clinical Impression(s) / ED Diagnoses Final diagnoses:  Right-sided epistaxis    Rx / DC Orders ED Discharge Orders     None         Josem Kaufmann 04/02/23 1934    Kommor, [place], MD 04/03/23 [phone]

## 2023-04-03 ENCOUNTER — Telehealth: Payer: Self-pay

## 2023-04-03 NOTE — Telephone Encounter (Signed)
Transition Care Management Follow-up Telephone Call Date of discharge and from where: Sean Golden 7/5 How have you been since you were released from the hospital? Same  Any questions or concerns? No  Items Reviewed: Did the pt receive and understand the discharge instructions provided? Yes  Medications obtained and verified? Yes  Other? No  Any new allergies since your discharge? No  Dietary orders reviewed? No Do you have support at home? Yes     Follow up appointments reviewed:  PCP Hospital f/u appt confirmed? No  Scheduled to see  on  @ . Specialist Hospital f/u appt confirmed? Yes  Scheduled to see  on  @ . Are transportation arrangements needed? No  If their condition worsens, is the pt aware to call PCP or go to the Emergency Dept.? Yes Was the patient provided with contact information for the PCP's office or ED? Yes Was to pt encouraged to call back with questions or concerns? Yes

## 2023-04-04 ENCOUNTER — Telehealth: Payer: Self-pay

## 2023-04-04 DIAGNOSIS — R040 Epistaxis: Secondary | ICD-10-CM | POA: Diagnosis not present

## 2023-04-04 NOTE — Telephone Encounter (Signed)
Sean Golden  P [facility] Triage (supporting Jonelle Sidle, MD)41 minutes ago (9:43 AM)    I saw Dr Suszanne Conners this morning. He had to repack my nose, still bleeding a little with no Eliquis since Wednesday morning. He wants me to take no Eliquis until I come back Monday. He is hoping that going to 2.5 mg. Permanently will fix the issue. I told him I was between a rock and a hard spot, to which he replied yes you are. I said so I die from a stroke or bleed to death.  I have an appointment with you all on Thursday in [place]. Please advise me concerning Eliquis. Should I seek 2nd opinion.

## 2023-04-07 DIAGNOSIS — R040 Epistaxis: Secondary | ICD-10-CM | POA: Diagnosis not present

## 2023-04-09 DIAGNOSIS — Z947 Corneal transplant status: Secondary | ICD-10-CM | POA: Diagnosis not present

## 2023-04-09 DIAGNOSIS — Z9841 Cataract extraction status, right eye: Secondary | ICD-10-CM | POA: Diagnosis not present

## 2023-04-09 DIAGNOSIS — H1820 Unspecified corneal edema: Secondary | ICD-10-CM | POA: Diagnosis not present

## 2023-04-09 DIAGNOSIS — Z9889 Other specified postprocedural states: Secondary | ICD-10-CM | POA: Diagnosis not present

## 2023-04-09 DIAGNOSIS — H21261 Iris atrophy (essential) (progressive), right eye: Secondary | ICD-10-CM | POA: Diagnosis not present

## 2023-04-09 DIAGNOSIS — H2512 Age-related nuclear cataract, left eye: Secondary | ICD-10-CM | POA: Diagnosis not present

## 2023-04-09 DIAGNOSIS — Z961 Presence of intraocular lens: Secondary | ICD-10-CM | POA: Diagnosis not present

## 2023-04-09 DIAGNOSIS — H4051X0 Glaucoma secondary to other eye disorders, right eye, stage unspecified: Secondary | ICD-10-CM | POA: Diagnosis not present

## 2023-04-10 ENCOUNTER — Encounter: Payer: Self-pay | Admitting: Cardiology

## 2023-04-10 ENCOUNTER — Ambulatory Visit: Payer: PPO | Attending: Cardiology | Admitting: Cardiology

## 2023-04-10 ENCOUNTER — Other Ambulatory Visit: Payer: Self-pay | Admitting: Student

## 2023-04-10 VITALS — BP 148/96 | HR 87 | Ht 70.0 in | Wt 230.0 lb

## 2023-04-10 DIAGNOSIS — I700 Atherosclerosis of aorta: Secondary | ICD-10-CM | POA: Diagnosis not present

## 2023-04-10 DIAGNOSIS — I10 Essential (primary) hypertension: Secondary | ICD-10-CM

## 2023-04-10 DIAGNOSIS — I4821 Permanent atrial fibrillation: Secondary | ICD-10-CM

## 2023-04-10 MED ORDER — APIXABAN 2.5 MG PO TABS: 2.5000 mg | ORAL_TABLET | Freq: Two times a day (BID) | ORAL | 6 refills | Status: DC

## 2023-04-10 NOTE — Progress Notes (Signed)
Cardiology Office Note  Date: 04/10/2023   ID: Sean Golden, DOB 1946-08-02, MRN [id number]  History of Present Illness: Sean Golden is a 77 y.o. male last assessed by Ms. Swinyer NP in January, I reviewed the note.  Our last visit was in July 2022.  He is here for a follow-up visit. Interval chart reviewed.  He has had problems with recurring nosebleeds, seen by ENT and treated with cautery.  Has still had recurrent bleeds requiring packing.  He was temporarily off Eliquis and just recently plan is to start back at 2.5 mg twice daily dose to see if he tolerates this.  He does not report any sense of palpitations, no increasing breathlessness with activity, no chest pain.  He remains on Cardizem CD for heart rate control.  Blood pressure elevated today, we discussed checking home blood pressure measurements to see if there is an increasing trend that might require further treatment.  Physical Exam: VS:  BP (!) 148/96   Pulse 87   Ht 5\' 10"  (1.778 m)   Wt 230 lb (104.3 kg)   SpO2 96%   BMI 33.00 kg/m , BMI Body mass index is 33 kg/m.  Wt Readings from Last 3 Encounters:  04/10/23 230 lb (104.3 kg)  04/02/23 227 lb (103 kg)  03/27/23 227 lb (103 kg)    General: Patient appears comfortable at rest. HEENT: Conjunctiva and lids normal. Lungs: Clear to auscultation, nonlabored breathing at rest. Cardiac: Irregular without gallop. Extremities: No pitting edema.  ECG:  An ECG dated 11/16/2021 was personally reviewed today and demonstrated:  Atrial fibrillation with leftward axis, low voltage, decreased R wave progression.  Labwork: 02/25/2023: ALT 14; AST 14; BUN 17; Creatinine, Ser 0.95; Potassium 4.0; Sodium 135 04/02/2023: Hemoglobin 14.2; Platelets 292  April 2024: Cholesterol 187, triglycerides 76, HDL 46, LDL 127  Other Studies Reviewed Today:  Echocardiogram 10/31/2021:  1. Left ventricular ejection fraction, by estimation, is 60 to 65%. The  left ventricle has normal  function. The left ventricle has no regional  wall motion abnormalities. There is moderate left ventricular hypertrophy.  Left ventricular diastolic  parameters are indeterminate.   2. Right ventricular systolic function is normal. The right ventricular  size is normal. There is normal pulmonary artery systolic pressure.   3. Left atrial size was mildly dilated.   4. Prominent epicardial adipose tissue.   5. The mitral valve is normal in structure. No evidence of mitral valve  regurgitation. No evidence of mitral stenosis.   6. The aortic valve is tricuspid. There is mild calcification of the  aortic valve. Aortic valve regurgitation is not visualized. Aortic valve  sclerosis is present, with no evidence of aortic valve stenosis.   7. The inferior vena cava is normal in size with greater than 50%  respiratory variability, suggesting right atrial pressure of 3 mmHg.  Assessment and Plan:  1.  Permanent atrial fibrillation with CHA2DS2-VASc score of 3.  Plan at present time is to reinitiate Eliquis at 2.5 mg twice daily and see how he does in terms of recurring nosebleeds.  If this continues to settle down then we may ultimately be able to go back up to Eliquis at 5 mg twice daily.  Otherwise, if his nosebleeds are not a clearly reversible issue and exacerbated by anticoagulation, could potentially consider Watchman device consultation.  2.  Essential hypertension.  I have recommended that he check blood pressure more regularly at home to better assess trend.  May  need further medication adjustments.  Disposition:  Follow up  4 to 6 weeks.  Signed, Jonelle Sidle, M.D., F.A.C.[facility]

## 2023-04-10 NOTE — Patient Instructions (Signed)
Medication Instructions:   DECREASE Eliquis to 2.5 mg Twice a day  Labwork: None today  Testing/Procedures: None today  Follow-Up: 4-6 weeks  Any Other Special Instructions Will Be Listed Below (If Applicable).  If you need a refill on your cardiac medications before your next appointment, please call your pharmacy.

## 2023-04-17 ENCOUNTER — Emergency Department (HOSPITAL_COMMUNITY)
Admission: EM | Admit: 2023-04-17 | Discharge: 2023-04-18 | Disposition: A | Discharge status: Home / Self Care | Payer: PPO | Attending: Emergency Medicine | Admitting: Emergency Medicine

## 2023-04-17 ENCOUNTER — Other Ambulatory Visit: Payer: Self-pay

## 2023-04-17 ENCOUNTER — Encounter (HOSPITAL_COMMUNITY): Payer: Self-pay

## 2023-04-17 DIAGNOSIS — I10 Essential (primary) hypertension: Secondary | ICD-10-CM | POA: Diagnosis not present

## 2023-04-17 DIAGNOSIS — Z7901 Long term (current) use of anticoagulants: Secondary | ICD-10-CM | POA: Diagnosis not present

## 2023-04-17 DIAGNOSIS — R040 Epistaxis: Secondary | ICD-10-CM | POA: Diagnosis not present

## 2023-04-17 NOTE — ED Triage Notes (Signed)
Pt with nose bleed since 10pm. Pt is on blood thinners.

## 2023-04-18 LAB — BASIC METABOLIC PANEL
Anion gap: 6 (ref 5–15)
BUN: 16 mg/dL (ref 8–23)
CO2: 23 mmol/L (ref 22–32)
Calcium: 8.8 mg/dL — ABNORMAL LOW (ref 8.9–10.3)
Chloride: 106 mmol/L (ref 98–111)
Creatinine, Ser: 1.05 mg/dL (ref 0.61–1.24)
GFR, Estimated: >60 mL/min (ref >60)
Glucose, Bld: 144 mg/dL — ABNORMAL HIGH (ref 70–99)
Potassium: 3.7 mmol/L (ref 3.5–5.1)
Sodium: 135 mmol/L (ref 135–145)

## 2023-04-18 LAB — CBC WITH DIFFERENTIAL/PLATELET
Abs Immature Granulocytes: 0.04 10*3/uL (ref 0.00–0.07)
Basophils Absolute: 0.1 10*3/uL (ref 0.0–0.1)
Basophils Relative: 1 %
Eosinophils Absolute: 0.2 10*3/uL (ref 0.0–0.5)
Eosinophils Relative: 2 %
HCT: 36.1 % — ABNORMAL LOW (ref 39.0–52.0)
Hemoglobin: 12.1 g/dL — ABNORMAL LOW (ref 13.0–17.0)
Immature Granulocytes: 0 %
Lymphocytes Relative: 21 %
Lymphs Abs: 2 10*3/uL (ref 0.7–4.0)
MCH: 30.9 pg (ref 26.0–34.0)
MCHC: 33.5 g/dL (ref 30.0–36.0)
MCV: 92.1 fL (ref 80.0–100.0)
Monocytes Absolute: 1.1 10*3/uL — ABNORMAL HIGH (ref 0.1–1.0)
Monocytes Relative: 11 %
Neutro Abs: 6.1 10*3/uL (ref 1.7–7.7)
Neutrophils Relative %: 65 %
Platelets: 341 10*3/uL (ref 150–400)
RBC: 3.92 MIL/uL — ABNORMAL LOW (ref 4.22–5.81)
RDW: 12.2 % (ref 11.5–15.5)
WBC: 9.4 10*3/uL (ref 4.0–10.5)
nRBC: 0 % (ref 0.0–0.2)

## 2023-04-18 LAB — PROTIME-INR
INR: 1.2 (ref 0.8–1.2)
Prothrombin Time: 15.1 seconds (ref 11.4–15.2)

## 2023-04-18 MED ORDER — OXYMETAZOLINE HCL 0.05 % NA SOLN
1.0000 | Freq: Once | NASAL | Status: AC
  Administered 2023-04-18: 1 via NASAL

## 2023-04-18 NOTE — Discharge Instructions (Signed)
Follow-up with ENT in the next few days, and return to the ER if symptoms worsen or change.

## 2023-04-18 NOTE — ED Provider Notes (Signed)
[place] EMERGENCY DEPARTMENT AT [facility] Provider Note   CSN: [id number] Arrival date & time: 04/17/23  2223     History  Chief Complaint  Patient presents with   Epistaxis    Sean Golden is a 77 y.o. male.  Patient is a 76 year old male with history of atrial fibrillation on Eliquis, hypertension, GERD.  Patient presenting today with complaints of nosebleed.  This has occurred off and on for the past month and has been seen by Dr. Suszanne Conners from ENT.  This evening when going to bed, the nose began bleeding again and he is having difficulty controlling it.  No injury or trauma.  No relief with direct pressure.  Bleeding is primarily from the right nares and also is bleeding down his throat.  The history is provided by the patient.       Home Medications Prior to Admission medications   Medication Sig Start Date End Date Taking? Authorizing Provider  acetaminophen (TYLENOL) 325 MG tablet Take 2 tablets (650 mg total) by mouth every 6 (six) hours as needed for mild pain, fever or headache (or Fever >/= 101). 11/12/21   Johnson, Clanford L, MD  apixaban (ELIQUIS) 2.5 MG TABS tablet Take 1 tablet (2.5 mg total) by mouth 2 (two) times daily. 04/10/23   Jonelle Sidle, MD  diltiazem (CARDIZEM CD) 240 MG 24 hr capsule TAKE ONE CAPSULE (240MG  TOTAL) BY MOUTH DAILY 04/10/23   Jonelle Sidle, MD  pantoprazole (PROTONIX) 40 MG tablet Take 1 tablet (40 mg total) by mouth 2 (two) times daily. 12/11/22 12/11/23  Lanelle Bal, DO  tamsulosin (FLOMAX) 0.4 MG CAPS capsule Take 2 capsules (0.8 mg total) by mouth every evening. Patient taking differently: Take 0.8 mg by mouth daily after breakfast. 11/12/21   Cleora Fleet, MD      Allergies    Patient has no known allergies.    Review of Systems   Review of Systems  All other systems reviewed and are negative.   Physical Exam Updated Vital Signs BP 133/84 (BP Location: Left Arm)   Pulse 93   Temp 98 F (36.7  C)   Resp 19   SpO2 96%  Physical Exam Vitals and nursing note reviewed.  Constitutional:      Appearance: Normal appearance.  HENT:     Nose:     Comments: There is no obvious site of bleeding, but blood appears to be coming from the right nares. Pulmonary:     Effort: Pulmonary effort is normal.  Skin:    General: Skin is warm and dry.  Neurological:     Mental Status: He is alert and oriented to person, place, and time.     ED Results / Procedures / Treatments   Labs (all labs ordered are listed, but only abnormal results are displayed) Labs Reviewed - No data to display  EKG None  Radiology No results found.  Procedures Procedures    Medications Ordered in ED Medications - No data to display  ED Course/ Medical Decision Making/ A&P  Patient with history of atrial fibrillation on Eliquis presenting with nosebleed.  He has a history of nosebleeds and has been seen by ENT recently.  His nose started bleeding again this evening when he laid down to go to sleep.  Patient arrives here with stable vital signs, but is having active bleeding from the right nares.  Laboratory studies reveal hemoglobin of 12.1, but CBC, metabolic panel, and coags otherwise  unremarkable.  A Merocel packing was placed and saturated with Afrin with good results.  Patient has been observed for several hours and bleeding thus far has not recurred.  Patient to be discharged with ENT follow-up.  Final Clinical Impression(s) / ED Diagnoses Final diagnoses:  None    Rx / DC Orders ED Discharge Orders     None         Geoffery Lyons, MD 04/18/23 0221

## 2023-04-18 NOTE — ED Notes (Signed)
Assisted provider with packing R nare. Following procedure, assisted patient in rinsing the blood from mouth with water and using sponge sticks. Patient also assisted with urinal

## 2023-04-21 DIAGNOSIS — R040 Epistaxis: Secondary | ICD-10-CM | POA: Diagnosis not present

## 2023-04-23 DIAGNOSIS — R040 Epistaxis: Secondary | ICD-10-CM | POA: Diagnosis not present

## 2023-04-23 DIAGNOSIS — Z7901 Long term (current) use of anticoagulants: Secondary | ICD-10-CM | POA: Diagnosis not present

## 2023-05-08 ENCOUNTER — Encounter: Payer: Self-pay | Admitting: Nurse Practitioner

## 2023-05-08 ENCOUNTER — Ambulatory Visit: Payer: PPO | Attending: Nurse Practitioner | Admitting: Nurse Practitioner

## 2023-05-08 VITALS — BP 122/74 | HR 86 | Ht 70.0 in | Wt 233.4 lb

## 2023-05-08 DIAGNOSIS — Z79899 Other long term (current) drug therapy: Secondary | ICD-10-CM | POA: Diagnosis not present

## 2023-05-08 DIAGNOSIS — Z7901 Long term (current) use of anticoagulants: Secondary | ICD-10-CM

## 2023-05-08 DIAGNOSIS — D649 Anemia, unspecified: Secondary | ICD-10-CM

## 2023-05-08 DIAGNOSIS — D500 Iron deficiency anemia secondary to blood loss (chronic): Secondary | ICD-10-CM

## 2023-05-08 DIAGNOSIS — I4821 Permanent atrial fibrillation: Secondary | ICD-10-CM

## 2023-05-08 DIAGNOSIS — I10 Essential (primary) hypertension: Secondary | ICD-10-CM

## 2023-05-08 NOTE — Progress Notes (Signed)
Cardiology Office Note:  .   Date:  05/08/2023 ID:  DEVANG SECCOMBE, DOB 1945-09-26, MRN [id number] PCP: Benita Stabile, MD  [facility] Providers Cardiologist:  Nona Dell, MD    History of Present Illness: .   Sean Golden is a 77 y.o. male with a PMH of permanent A-fib, HTN, pancreatitis, OSA, GERD, and hx of nosebleeds, who presents today for 4-6 week follow-up.   Last seen by Dr. Diona Browner on April 10, 2023. Pt reported recurrent issues with nosebleeds, seen by ENT and was tx with cautery, however still noted recurrent bleeds requiring packing. Was temporarily off Eliquis with plan to restart at 2.5 mg BID. BP was elevated in office.   Today he presents for office follow-up. Hasn't had a nosebleed in over 2 weeks. Denies any chest pain, shortness of breath, palpitations, syncope, presyncope, dizziness, orthopnea, PND, swelling or significant weight changes, or claudication.  Studies Reviewed: .    Echo 10/2021:  1. Left ventricular ejection fraction, by estimation, is 60 to 65%. The  left ventricle has normal function. The left ventricle has no regional  wall motion abnormalities. There is moderate left ventricular hypertrophy. Left ventricular diastolic parameters are indeterminate.   2. Right ventricular systolic function is normal. The right ventricular  size is normal. There is normal pulmonary artery systolic pressure.   3. Left atrial size was mildly dilated.   4. Prominent epicardial adipose tissue.   5. The mitral valve is normal in structure. No evidence of mitral valve regurgitation. No evidence of mitral stenosis.   6. The aortic valve is tricuspid. There is mild calcification of the  aortic valve. Aortic valve regurgitation is not visualized. Aortic valve  sclerosis is present, with no evidence of aortic valve stenosis.   7. The inferior vena cava is normal in size with greater than 50%  respiratory variability, suggesting right atrial pressure of 3 mmHg.    Lexiscan 08/2015: There was no ST segment deviation noted during stress. Defect 1: There is a small defect of mild severity present in the basal inferior and mid inferior location. The study is normal. This is a low risk study. The left ventricular ejection fraction is hyperdynamic (>65%).  Physical Exam:   VS:  BP 122/74   Pulse 86   Ht 5\' 10"  (1.778 m)   Wt 233 lb 6.4 oz (105.9 kg)   SpO2 96%   BMI 33.49 kg/m    Wt Readings from Last 3 Encounters:  05/08/23 233 lb 6.4 oz (105.9 kg)  04/10/23 230 lb (104.3 kg)  04/02/23 227 lb (103 kg)    GEN: Obese, 77 y.o. male in no acute distress NECK: No JVD; No carotid bruits CARDIAC: S1/S2, irregularly irregular, no murmurs, rubs, gallops RESPIRATORY:  Clear to auscultation without rales, wheezing or rhonchi  ABDOMEN: Soft, non-tender, non-distended EXTREMITIES:  No edema; No deformity   ASSESSMENT AND PLAN: .   Permanent A-fib, nosebleeds/blood loss anemia, (long term use of anticoagulation), medication management Denies any tachycardia or palpitations. Has not had a nosebleed in over 2 weeks, tolerating low dose Eliquis well currently. Will obtain CBC to determine Hgb level. If Hgb is stable and pt denies any recurrent nosebleeds, will trial increasing Eliquis to 5 mg BID as Dr. Diona Browner previously stated in progress note on 04/10/2023. Did discuss option with patient regarding if recurrent nosebleeds on anticoagulation, could potentially consider Watchman consultation, however pt declines this. Continue current Eliquis dose at this time and continue diltiazem. Heart  healthy diet and regular cardiovascular exercise encouraged.   HTN BP stable. Continue diltiazem. Discussed to monitor BP at home at least 2 hours after medications and sitting for 5-10 minutes. Heart healthy diet and regular cardiovascular exercise encouraged.   Dispo: Follow-up in 4-6 weeks with me/APP or sooner if anything changes.   Signed, Sharlene Dory, NP

## 2023-05-08 NOTE — Patient Instructions (Addendum)
Medication Instructions:   Continue all current medications.   Labwork:  CBC - order given Office will contact with results via phone, letter or mychart.     Testing/Procedures:  none  Follow-Up:  4-6 weeks    Any Other Special Instructions Will Be Listed Below (If Applicable).   If you need a refill on your cardiac medications before your next appointment, please call your pharmacy.

## 2023-06-10 ENCOUNTER — Encounter: Payer: Self-pay | Admitting: Nurse Practitioner

## 2023-06-10 ENCOUNTER — Ambulatory Visit: Payer: PPO | Attending: Nurse Practitioner | Admitting: Nurse Practitioner

## 2023-06-10 VITALS — BP 125/85 | Ht 70.0 in | Wt 233.0 lb

## 2023-06-10 DIAGNOSIS — R0609 Other forms of dyspnea: Secondary | ICD-10-CM

## 2023-06-10 DIAGNOSIS — I4821 Permanent atrial fibrillation: Secondary | ICD-10-CM

## 2023-06-10 DIAGNOSIS — R5383 Other fatigue: Secondary | ICD-10-CM | POA: Diagnosis not present

## 2023-06-10 DIAGNOSIS — I10 Essential (primary) hypertension: Secondary | ICD-10-CM

## 2023-06-10 DIAGNOSIS — Z7901 Long term (current) use of anticoagulants: Secondary | ICD-10-CM

## 2023-06-10 DIAGNOSIS — D500 Iron deficiency anemia secondary to blood loss (chronic): Secondary | ICD-10-CM | POA: Diagnosis not present

## 2023-06-10 MED ORDER — APIXABAN 5 MG PO TABS: 5.0000 mg | ORAL_TABLET | Freq: Two times a day (BID) | ORAL | 5 refills | Status: DC

## 2023-06-10 NOTE — Patient Instructions (Addendum)
Medication Instructions:  Your physician has recommended you make the following change in your medication:  Increase Eliquis to 5 Mg twice a day Continue all other medications as prescribed   Labwork: Please have CBC drawn   Testing/Procedures: None  Follow-Up: Your physician recommends that you schedule a follow-up appointment in: 3 Months   Any Other Special Instructions Will Be Listed Below (If Applicable).  If you need a refill on your cardiac medications before your next appointment, please call your pharmacy.

## 2023-06-10 NOTE — Progress Notes (Unsigned)
Virtual Visit via Telephone Note   Because of Sean Golden's co-morbid illnesses, he is at least at moderate risk for complications without adequate follow up.  This format is felt to be most appropriate for this patient at this time.  The patient did not have access to video technology/had technical difficulties with video requiring transitioning to audio format only (telephone).  All issues noted in this document were discussed and addressed.  No physical exam could be performed with this format.  Please refer to the patient's chart for his consent to telehealth for Sean Golden.    Date: 06/10/2023  ID:  Sean Golden, DOB 1946/02/05, MRN [id number] The patient was identified using 2 identifiers.  Patient Location: Home Provider Location: Office/Clinic   PCP:  Benita Stabile, MD   [facility] Providers Cardiologist:  Nona Dell, MD     Evaluation Performed:  Follow-Up Visit  Chief Complaint:  Here for follow-up.   History of Present Illness:    Sean Golden is a 77 y.o. male with a PMH of permanent A-fib, HTN, pancreatitis, OSA, GERD, and hx of nosebleeds, who presents today for 4-6 week follow-up.    Last seen by Dr. Diona Browner on April 10, 2023. Pt reported recurrent issues with nosebleeds, seen by ENT and was tx with cautery, however still noted recurrent bleeds requiring packing. Was temporarily off Eliquis with plan to restart at 2.5 mg BID. BP was elevated in office.    Today he presents for telehealth follow-up. Hasn't had a nosebleed since last office visit, CBC not previously collected. Denies any chest pain, palpitations, syncope, presyncope, dizziness, orthopnea, PND, swelling or significant weight changes, or claudication. Does endorse fatigue, and DOE that has improved.  ROS:   Please see the history of present illness.    All other systems reviewed and are negative.   Prior CV studies:   The following studies were reviewed  today:  Echo 10/2021:  1. Left ventricular ejection fraction, by estimation, is 60 to 65%. The  left ventricle has normal function. The left ventricle has no regional  wall motion abnormalities. There is moderate left ventricular hypertrophy. Left ventricular diastolic parameters are indeterminate.   2. Right ventricular systolic function is normal. The right ventricular  size is normal. There is normal pulmonary artery systolic pressure.   3. Left atrial size was mildly dilated.   4. Prominent epicardial adipose tissue.   5. The mitral valve is normal in structure. No evidence of mitral valve regurgitation. No evidence of mitral stenosis.   6. The aortic valve is tricuspid. There is mild calcification of the  aortic valve. Aortic valve regurgitation is not visualized. Aortic valve  sclerosis is present, with no evidence of aortic valve stenosis.   7. The inferior vena cava is normal in size with greater than 50%  respiratory variability, suggesting right atrial pressure of 3 mmHg.    Lexiscan 08/2015: There was no ST segment deviation noted during stress. Defect 1: There is a small defect of mild severity present in the basal inferior and mid inferior location. The study is normal. This is a low risk study. The left ventricular ejection fraction is hyperdynamic (>65%).  Labs/Other Tests and Data Reviewed:    EKG:  EKG is not ordered today.  Risk Assessment/Calculations:    CHA2DS2-VASc Score = 3  This indicates a 3.2% annual risk of stroke. The patient's score is based upon: CHF History: 0 HTN History: 1 Diabetes History: 0  Stroke History: 0 Vascular Disease History: 1 Age Score: 1 Gender Score: 0  Objective:    Vital Signs:  BP 125/85   Ht 5\' 10"  (1.778 m)   Wt 233 lb (105.7 kg)   BMI 33.43 kg/m    Due to the nature of today's visit, a physical exam was unable to be performed.  ASSESSMENT & PLAN:    Permanent A-fib, nosebleeds/blood loss anemia, fatigue, DOE  (long term use of anticoagulation) Denies any tachycardia or palpitations. Has not had a recurrent nosebleed, CBC has not been obtained. Endorses fatigue, low energy, with some DOE that has improved, attributes this to his past low Hgb. Recommended to increase Eliquis to 5 mg BID since he has not noticed bleeding. Offered/recommended to obtain CBC, however pt declines and defers his PCP to get scheduled labs next month, will fax [place] results.  Previously did discuss option with patient regarding if recurrent nosebleeds on anticoagulation, could potentially consider Watchman consultation, however pt declined this. Continue diltiazem. Heart healthy diet and regular cardiovascular exercise encouraged. Care and ED precautions discussed.   HTN BP stable. Continue diltiazem. Discussed to monitor BP at home at least 2 hours after medications and sitting for 5-10 minutes. Heart healthy diet and regular cardiovascular exercise encouraged.   Time:   Today, I have spent 5 minutes with the patient with telehealth technology discussing the above problems.     Medication Adjustments/Labs and Tests Ordered: Current medicines are reviewed at length with the patient today.  Concerns regarding medicines are outlined above.   Tests Ordered: No orders of the defined types were placed in this encounter.   Medication Changes: Meds ordered this encounter  Medications   apixaban (ELIQUIS) 5 MG TABS tablet    Sig: Take 1 tablet (5 mg total) by mouth 2 (two) times daily.    Dispense:  60 tablet    Refill:  5    Follow Up:  In Person in 3 month(s)  Signed, Sharlene Dory, NP  06/11/2023 8:41 PM    [facility]

## 2023-07-17 DIAGNOSIS — Z125 Encounter for screening for malignant neoplasm of prostate: Secondary | ICD-10-CM | POA: Diagnosis not present

## 2023-07-17 DIAGNOSIS — R7303 Prediabetes: Secondary | ICD-10-CM | POA: Diagnosis not present

## 2023-07-17 DIAGNOSIS — E782 Mixed hyperlipidemia: Secondary | ICD-10-CM | POA: Diagnosis not present

## 2023-07-31 DIAGNOSIS — I482 Chronic atrial fibrillation, unspecified: Secondary | ICD-10-CM | POA: Diagnosis not present

## 2023-07-31 DIAGNOSIS — Z23 Encounter for immunization: Secondary | ICD-10-CM | POA: Diagnosis not present

## 2023-07-31 DIAGNOSIS — R7303 Prediabetes: Secondary | ICD-10-CM | POA: Diagnosis not present

## 2023-07-31 DIAGNOSIS — R5383 Other fatigue: Secondary | ICD-10-CM | POA: Diagnosis not present

## 2023-07-31 DIAGNOSIS — R3915 Urgency of urination: Secondary | ICD-10-CM | POA: Diagnosis not present

## 2023-07-31 DIAGNOSIS — K219 Gastro-esophageal reflux disease without esophagitis: Secondary | ICD-10-CM | POA: Diagnosis not present

## 2023-07-31 DIAGNOSIS — D649 Anemia, unspecified: Secondary | ICD-10-CM | POA: Diagnosis not present

## 2023-07-31 DIAGNOSIS — H18221 Idiopathic corneal edema, right eye: Secondary | ICD-10-CM | POA: Diagnosis not present

## 2023-07-31 DIAGNOSIS — I10 Essential (primary) hypertension: Secondary | ICD-10-CM | POA: Diagnosis not present

## 2023-07-31 DIAGNOSIS — K8501 Idiopathic acute pancreatitis with uninfected necrosis: Secondary | ICD-10-CM | POA: Diagnosis not present

## 2023-07-31 DIAGNOSIS — H409 Unspecified glaucoma: Secondary | ICD-10-CM | POA: Diagnosis not present

## 2023-07-31 DIAGNOSIS — D6869 Other thrombophilia: Secondary | ICD-10-CM | POA: Diagnosis not present

## 2023-07-31 DIAGNOSIS — E782 Mixed hyperlipidemia: Secondary | ICD-10-CM | POA: Diagnosis not present

## 2023-08-27 DIAGNOSIS — Z961 Presence of intraocular lens: Secondary | ICD-10-CM | POA: Diagnosis not present

## 2023-08-27 DIAGNOSIS — H2512 Age-related nuclear cataract, left eye: Secondary | ICD-10-CM | POA: Diagnosis not present

## 2023-08-27 DIAGNOSIS — H21261 Iris atrophy (essential) (progressive), right eye: Secondary | ICD-10-CM | POA: Diagnosis not present

## 2023-08-27 DIAGNOSIS — H1820 Unspecified corneal edema: Secondary | ICD-10-CM | POA: Diagnosis not present

## 2023-08-27 DIAGNOSIS — H4051X0 Glaucoma secondary to other eye disorders, right eye, stage unspecified: Secondary | ICD-10-CM | POA: Diagnosis not present

## 2023-08-27 DIAGNOSIS — Z947 Corneal transplant status: Secondary | ICD-10-CM | POA: Diagnosis not present

## 2023-08-27 DIAGNOSIS — Z9841 Cataract extraction status, right eye: Secondary | ICD-10-CM | POA: Diagnosis not present

## 2023-09-10 ENCOUNTER — Telehealth: Payer: Self-pay | Admitting: Internal Medicine

## 2023-09-10 NOTE — Telephone Encounter (Signed)
Your next available appointment is 11/25/2023. Would you like to [facility] your schedule in January 2025.

## 2023-09-10 NOTE — Telephone Encounter (Signed)
   Front desk/[place]   GAILEN LIBERA daughter is our family friend. He wants to see me for consult. Heavey smoker. No PFT in past. Last CT chest feb 2023 and then April 2024 lung base clear.   Plan  - get HRCT supine and prone for dyspnea  - get PFT if pssible -  - get al this in [place] - see me Jan 2025. They are asking for ASAP but if he is decling next 2 weeks then is ER or [facility] -> you can tell them to start with [place] after CT +/- PFT and then see me       reports that he quit smoking about 39 years ago. His smoking use included cigarettes. He started smoking about 56 years ago. He has a 8.5 pack-year smoking history. He has been exposed to tobacco smoke. He has never used smokeless tobacco.       No data to display

## 2023-09-11 ENCOUNTER — Ambulatory Visit: Payer: PPO | Admitting: Nurse Practitioner

## 2023-09-19 NOTE — Telephone Encounter (Signed)
I can see 19/25 at 4:30pm . If you get HRCT before that great. If not I can just see and then orderr PFT/HRCT. Are you able to accommodte? I might make up time with some of the 30 min ILD ptiatnets that day

## 2023-09-22 NOTE — Telephone Encounter (Signed)
LM to call to set up PFT and Appt. Per office protocol after 2nd attempt closing encounter.

## 2023-10-01 ENCOUNTER — Other Ambulatory Visit (HOSPITAL_BASED_OUTPATIENT_CLINIC_OR_DEPARTMENT_OTHER): Payer: Self-pay

## 2023-10-01 DIAGNOSIS — R0609 Other forms of dyspnea: Secondary | ICD-10-CM

## 2023-10-02 ENCOUNTER — Ambulatory Visit (HOSPITAL_BASED_OUTPATIENT_CLINIC_OR_DEPARTMENT_OTHER): Payer: PPO | Admitting: Internal Medicine

## 2023-10-02 ENCOUNTER — Ambulatory Visit: Payer: PPO | Admitting: Internal Medicine

## 2023-10-02 ENCOUNTER — Encounter: Payer: Self-pay | Admitting: Internal Medicine

## 2023-10-02 VITALS — BP 153/100 | HR 86 | Ht 69.0 in | Wt 245.2 lb

## 2023-10-02 DIAGNOSIS — Z87891 Personal history of nicotine dependence: Secondary | ICD-10-CM

## 2023-10-02 DIAGNOSIS — R0609 Other forms of dyspnea: Secondary | ICD-10-CM | POA: Diagnosis not present

## 2023-10-02 DIAGNOSIS — Z862 Personal history of diseases of the blood and blood-forming organs and certain disorders involving the immune mechanism: Secondary | ICD-10-CM | POA: Diagnosis not present

## 2023-10-02 DIAGNOSIS — Z8669 Personal history of other diseases of the nervous system and sense organs: Secondary | ICD-10-CM

## 2023-10-02 DIAGNOSIS — Z8679 Personal history of other diseases of the circulatory system: Secondary | ICD-10-CM | POA: Diagnosis not present

## 2023-10-02 LAB — PULMONARY FUNCTION TEST
DL/VA % pred: 112 %
DL/VA: 4.48 ml/min/mmHg/L
DLCO cor % pred: 107 %
DLCO cor: 26.11 ml/min/mmHg
DLCO unc % pred: 107 %
DLCO unc: 26.11 ml/min/mmHg
FEF 25-75 Post: 2.83 L/s
FEF 25-75 Pre: 3.15 L/s
FEF2575-%Change-Post: -10 %
FEF2575-%Pred-Post: 132 %
FEF2575-%Pred-Pre: 147 %
FEV1-%Change-Post: -4 %
FEV1-%Pred-Post: 106 %
FEV1-%Pred-Pre: 110 %
FEV1-Post: 3.12 L
FEV1-Pre: 3.25 L
FEV1FVC-%Change-Post: 3 %
FEV1FVC-%Pred-Pre: 111 %
FEV6-%Change-Post: -7 %
FEV6-%Pred-Post: 97 %
FEV6-%Pred-Pre: 105 %
FEV6-Post: 3.72 L
FEV6-Pre: 4.02 L
FEV6FVC-%Pred-Post: 106 %
FEV6FVC-%Pred-Pre: 106 %
FVC-%Change-Post: -7 %
FVC-%Pred-Post: 91 %
FVC-%Pred-Pre: 98 %
FVC-Post: 3.72 L
FVC-Pre: 4.02 L
Post FEV1/FVC ratio: 84 %
Post FEV6/FVC ratio: 100 %
Pre FEV1/FVC ratio: 81 %
Pre FEV6/FVC Ratio: 100 %
RV % pred: 101 %
RV: 2.54 L
TLC % pred: 104 %
TLC: 7.17 L

## 2023-10-02 NOTE — Progress Notes (Signed)
 Full PFT Performed Today

## 2023-10-02 NOTE — Patient Instructions (Addendum)
 ICD-10-CM   1. DOE (dyspnea on exertion)  R06.09     2. History of anemia  Z86.2     3. Former smoker  Z87.891     4. History of atrial fibrillation  Z86.79     5. History of obstructive sleep apnea  Z86.69       Given normal PFT test , physical exam and exercise hypxoemai test I am less concerned about a lung problem causing shorttness of breath I am more concerned about anemia, stif heart muscle, physical deconditonig, weight gain as reasons  Plan  - do HRCT supine and prone  - do cbc with diff and bnp - do ECHO   Followup  - video visit ideally (or face to face) to discuss results Q - can be with APP or Verenice Westrich  - next 3-6 weeks but give 2-3 weeks after CT is done

## 2023-10-02 NOTE — Progress Notes (Signed)
 OV 10/02/2023  Subjective:  Patient ID: Sean Golden, male , DOB: Mar 05, 1946 , age 78 y.o. , MRN: [id number] , ADDRESS: [address] [place] [id number] PCP [name], MD Patient Care Team: [name], MD as PCP - General (Internal Medicine) Debera Jayson MATSU, MD as PCP - Cardiology (Cardiology) Harvey Margo CROME, MD (Inactive) as Consulting Physician (Gastroenterology)  This Provider for this visit: Treatment Team:  Attending Provider: Geronimo Amel, MD    10/02/2023 -   Chief Complaint  Patient presents with   Consult    Sob, former smoker, with no inhaler usage      HPI Sean Golden 78 y.o. -he is the father of Sean Golden who I know socially through my wife.SABRA Garner order consult requested for worsening shortness of breath.  He is a smoker in the past having quit in the 1980s.  Total smoking load is not significant.  He had an episode of severe pancreatitis approximately 2 years ago in February 2023 which he states he fully recovered from.  He suffers from sleep apnea for which he uses CPAP.  He has atrial fibrillation for which he is on Eliquis .  Last echocardiogram approximately 2 years ago February 2023 with grade 1 diastolic dysfunction.     He says for several years she has had insidious onset of shortness of breath worse with exertion relieved by rest.  But it has been getting worse slow and steady but then in June 2024 he had an episode of epistaxis followed by 3 more in July particularly on March 26, 2024, April 01, 2024 and April 17, 2024.  Apparently got cauterize each time.  Each time he was told this because of Eliquis .  He went to [facility] emergency department.  External medical records reviewed.  Final hemoglobin on April 18, 2023 showed a drop to 12.1 g%.  Kidney function normal.  Since then he followed up with primary care physician in approximately 3 months ago his hemoglobin was 11 g% and he was diagnosed with iron deficiency anemia.  He is  on iron supplementation and he says he is better because of that but he still has having worsening shortness of breath.  Currently shortness of breath is rated severe with exertion relieved by rest.  Symptom burden is extremely high [see below].  In fact when we did a simple sit/stand test on him he got quite short of breath.  He also has a mild cough here and there for the last 1 month.  However there is no chest pain no wheezing no paroxysmal nocturnal dyspnea no orthopnea.  He has baseline mild edema for which there is no change.  He had pulmonary function test today and it is normal  Of note he has gained significant amount of weight back.  He used to weigh baseline to 60 pounds.  With pancreatitis 2 years ago he went down to 220 pounds but is now gained 20 pounds in 2 years and is at around 240 pounds.    SYMPTOM SCALE  10/02/2023  Current weight   O2 use ra  Shortness of Breath 0 -> 5 scale with 5 being worst (score 6 If unable to do)  At rest 0  Simple tasks - showers, clothes change, eating, shaving 5  Household (dishes, doing bed, laundry) 5  Shopping 5  Walking level at own pace 5  Walking up Stairs 5  Total (30-36) Dyspnea Score 25  How bad  is your cough? 2  How bad is your fatigue 5  How bad is nausea 0  How bad is vomiting?  0  How bad is diarrhea? 0  How bad is anxiety? 0  How bad is depression 0  Any chronic pain - if so where and how bad 0       Simple office walk 224 (66+46 x 2) feet Pod A at [facility] x  3 laps goal with forehead probe 10/02/2023    O2 used ra   Number laps completed Sit stand x 15   Comments about pace x   Resting Pulse Ox/HR 98% and 91/min   Final Pulse Ox/HR 96% and 108/min   Desaturated </= 88% no   Desaturated <= 3% points no   Got Tachycardic >/= 90/min yes   Symptoms at end of test Very dyspneic   Miscellaneous comments x       CT Chest data from date: 12/23/22 CTa bdomen lung image  - personally visualized and independently  interpreted : yes - my findings are: lung bases are clear   LAbs  - July 2024  - mild anemia hgb 12.1gm%  LAB RESULTS last 96 hours No results found.  LAB RESULTS last 90 days Recent Results (from the past 2160 hours)  Pulmonary function test     Status: None (Preliminary result)   Collection Time: 10/02/23  9:05 AM  Result Value Ref Range   FVC-Pre 4.02 L   FVC-%Pred-Pre 98 %   FVC-Post 3.72 L   FVC-%Pred-Post 91 %   FVC-%Change-Post -7 %   FEV1-Pre 3.25 L   FEV1-%Pred-Pre 110 %   FEV1-Post 3.12 L   FEV1-%Pred-Post 106 %   FEV1-%Change-Post -4 %   FEV6-Pre 4.02 L   FEV6-%Pred-Pre 105 %   FEV6-Post 3.72 L   FEV6-%Pred-Post 97 %   FEV6-%Change-Post -7 %   Pre FEV1/FVC ratio 81 %   FEV1FVC-%Pred-Pre 111 %   Post FEV1/FVC ratio 84 %   FEV1FVC-%Change-Post 3 %   Pre FEV6/FVC Ratio 100 %   FEV6FVC-%Pred-Pre 106 %   Post FEV6/FVC ratio 100 %   FEV6FVC-%Pred-Post 106 %   FEF 25-75 Pre 3.15 L/sec   FEF2575-%Pred-Pre 147 %   FEF 25-75 Post 2.83 L/sec   FEF2575-%Pred-Post 132 %   FEF2575-%Change-Post -10 %   RV 2.54 L   RV % pred 101 %   TLC 7.17 L   TLC % pred 104 %   DLCO unc 26.11 ml/min/mmHg   DLCO unc % pred 107 %   DLCO cor 26.11 ml/min/mmHg   DLCO cor % pred 107 %   DL/VA 5.51 ml/min/mmHg/L   DL/VA % pred 887 %         has a past medical history of Atrial fibrillation (HCC) (05/2014), Essential hypertension, GERD (gastroesophageal reflux disease), Helicobacter pylori gastritis (08/2014), Pancreatitis, and Sleep apnea.   reports that he quit smoking about 39 years ago. His smoking use included cigarettes. He started smoking about 56 years ago. He has a 8.5 pack-year smoking history. He has been exposed to tobacco smoke. He has never used smokeless tobacco.  Past Surgical History:  Procedure Laterality Date   CARDIAC ELECTROPHYSIOLOGY STUDY AND ABLATION  2010?   COLONOSCOPY  2005   SOLITARY RECTAL ULCER   COLONOSCOPY N/A 09/06/2014   Dr. Harvey: three  colon polyps, moderate diverticulosis throughout the entire examined colon   ESOPHAGOGASTRODUODENOSCOPY N/A 09/06/2014   Dr. Fields:moderate erosive gastritis, no Barrett's. +H.pylori gastritis,  treated with Pylera   ESOPHAGOGASTRODUODENOSCOPY (EGD) WITH PROPOFOL  N/A 11/17/2021   Procedure: ESOPHAGOGASTRODUODENOSCOPY (EGD) WITH PROPOFOL ;  Surgeon: Golda Claudis PENNER, MD;  Location: [facility];  Service: Endoscopy;  Laterality: N/A;   ESOPHAGOGASTRODUODENOSCOPY (EGD) WITH PROPOFOL  N/A 11/27/2021   Procedure: ESOPHAGOGASTRODUODENOSCOPY (EGD) WITH PROPOFOL ;  Surgeon: Eartha Angelia Sieving, MD;  Location: [facility];  Service: Gastroenterology;  Laterality: N/A;   HEMORRHOID BANDING N/A 09/06/2014   NO BANDING PERFORMED. NO INTERNAL HEMORRHOIDS IDENTIFIED    UPPER GASTROINTESTINAL ENDOSCOPY  2005 RMR   VASECTOMY      No Known Allergies  Immunization History  Administered Date(s) Administered   Influenza Inj Mdck Quad Pf 07/16/2016   Influenza, High Dose Seasonal PF 07/29/2017, 06/18/2018, 06/21/2019   Influenza-Unspecified 06/21/2013   Moderna Sars-Covid-2 Vaccination 11/07/2019, 11/07/2019, 12/05/2019   Pneumococcal Polysaccharide-23 08/09/2019    Family History  Problem Relation Age of Onset   Hypertension Mother    Heart attack Father    Hyperlipidemia Sister    Hyperlipidemia Brother    Colon polyps Brother    Hypertension Brother    Hypertension Sister    Diabetes Brother    Stroke Other    Diabetes Other    Hypertension Other    Hyperlipidemia Other    Colon cancer Neg Hx      Current Outpatient Medications:    acetaminophen  (TYLENOL ) 325 MG tablet, Take 2 tablets (650 mg total) by mouth every 6 (six) hours as needed for mild pain, fever or headache (or Fever >/= 101)., Disp: , Rfl:    apixaban  (ELIQUIS ) 5 MG TABS tablet, Take 1 tablet (5 mg total) by mouth 2 (two) times daily., Disp: 60 tablet, Rfl: 5   diltiazem  (CARDIZEM  CD) 240 MG 24 hr capsule, TAKE ONE  CAPSULE (240MG  TOTAL) BY MOUTH DAILY, Disp: 90 capsule, Rfl: 3   pantoprazole  (PROTONIX ) 40 MG tablet, Take 1 tablet (40 mg total) by mouth 2 (two) times daily., Disp: 60 tablet, Rfl: 11   tamsulosin  (FLOMAX ) 0.4 MG CAPS capsule, Take 2 capsules (0.8 mg total) by mouth every evening., Disp: 60 capsule, Rfl: 2      Objective:   Vitals:   10/02/23 1617  BP: (!) 153/100  Pulse: 86  SpO2: 98%  Weight: 245 lb 3.2 oz (111.2 kg)  Height: 5' 9 (1.753 m)    Estimated body mass index is 36.21 kg/m as calculated from the following:   Height as of this encounter: 5' 9 (1.753 m).   Weight as of this encounter: 245 lb 3.2 oz (111.2 kg).  @WEIGHTCHANGE @  Filed Weights   10/02/23 1617  Weight: 245 lb 3.2 oz (111.2 kg)     Physical Exam   General: No distress.  O2 at rest: no Cane present: no Sitting in wheel chair: no Frail: no Obese: YES Neuro: Alert and Oriented x 3. GCS 15. Speech normal Psych: Pleasant Resp:  Barrel Chest - no.  Wheeze - no, Crackles - no, No overt respiratory distress CVS: Normal heart sounds. Murmurs - no Ext: Stigmata of Connective Tissue Disease - no HEENT: Normal upper airway. PEERL +. No post nasal drip        Assessment:       ICD-10-CM   1. DOE (dyspnea on exertion)  R06.09 CT Chest High Resolution    B Nat Peptide    CBC with Differential    ECHOCARDIOGRAM COMPLETE    2. History of anemia  Z86.2 CT Chest High Resolution    B  Nat Peptide    CBC with Differential    ECHOCARDIOGRAM COMPLETE    3. Former smoker  Z87.891 CT Chest High Resolution    B Nat Peptide    CBC with Differential    ECHOCARDIOGRAM COMPLETE    4. History of atrial fibrillation  Z86.79 CT Chest High Resolution    B Nat Peptide    CBC with Differential    ECHOCARDIOGRAM COMPLETE    5. History of obstructive sleep apnea  Z86.69 CT Chest High Resolution    B Nat Peptide    CBC with Differential    ECHOCARDIOGRAM COMPLETE         Plan:     Patient  Instructions     ICD-10-CM   1. DOE (dyspnea on exertion)  R06.09     2. History of anemia  Z86.2     3. Former smoker  Z87.891     4. History of atrial fibrillation  Z86.79     5. History of obstructive sleep apnea  Z86.69       Given normal PFT test , physical exam and exercise hypxoemai test I am less concerned about a lung problem causing shorttness of breath I am more concerned about anemia, stif heart muscle, physical deconditonig, weight gain as reasons  Plan  - do HRCT supine and prone  - do cbc with diff and bnp - do ECHO   Followup  - video visit ideally (or face to face) to discuss results Q - can be with APP or Ayvion Kavanagh  - next 3-6 weeks but give 2-3 weeks after CT is done   FOLLOWUP Return in about 4 weeks (around 10/30/2023) for 15 min visit, with Dr Geronimo, Dyspnea, with any of the APPS, VIDEO VISIT.    SIGNATURE    Dr. Dorethia Geronimo, M.D., F.C.C.P,  Pulmonary and Critical Care Medicine Staff Physician, [facility] Director - Interstitial Lung Disease  Program  Pulmonary Fibrosis [facility] at [facility] [place], [place], [address]  Pager: [phone], If no answer or between  15:00h - 7:00h: call [phone] Telephone: [phone]  5:07 PM 10/02/2023

## 2023-10-02 NOTE — Patient Instructions (Signed)
 Full PFT Performed Today

## 2023-10-09 ENCOUNTER — Ambulatory Visit (HOSPITAL_COMMUNITY)
Admission: RE | Admit: 2023-10-09 | Discharge: 2023-10-09 | Disposition: A | Discharge status: Home / Self Care | Payer: PPO | Source: Ambulatory Visit | Attending: Internal Medicine | Admitting: Internal Medicine

## 2023-10-09 DIAGNOSIS — Z8669 Personal history of other diseases of the nervous system and sense organs: Secondary | ICD-10-CM | POA: Insufficient documentation

## 2023-10-09 DIAGNOSIS — Z8679 Personal history of other diseases of the circulatory system: Secondary | ICD-10-CM | POA: Insufficient documentation

## 2023-10-09 DIAGNOSIS — Z87891 Personal history of nicotine dependence: Secondary | ICD-10-CM | POA: Insufficient documentation

## 2023-10-09 DIAGNOSIS — R0609 Other forms of dyspnea: Secondary | ICD-10-CM | POA: Diagnosis not present

## 2023-10-09 DIAGNOSIS — Z862 Personal history of diseases of the blood and blood-forming organs and certain disorders involving the immune mechanism: Secondary | ICD-10-CM | POA: Insufficient documentation

## 2023-10-09 DIAGNOSIS — R0602 Shortness of breath: Secondary | ICD-10-CM | POA: Diagnosis not present

## 2023-10-09 DIAGNOSIS — J479 Bronchiectasis, uncomplicated: Secondary | ICD-10-CM | POA: Diagnosis not present

## 2023-10-16 LAB — CBC WITH DIFFERENTIAL/PLATELET
Basophils Absolute: 0.1 10*3/uL (ref 0.0–0.1)
Basophils Relative: 0.8 % (ref 0.0–3.0)
Eosinophils Absolute: 0.2 10*3/uL (ref 0.0–0.7)
Eosinophils Relative: 2.2 % (ref 0.0–5.0)
HCT: 50.2 % (ref 39.0–52.0)
Hemoglobin: 16.5 g/dL (ref 13.0–17.0)
Lymphocytes Relative: 34.5 % (ref 12.0–46.0)
Lymphs Abs: 2.5 10*3/uL (ref 0.7–4.0)
MCHC: 32.9 g/dL (ref 30.0–36.0)
MCV: 85.8 fL (ref 78.0–100.0)
Monocytes Absolute: 0.8 10*3/uL (ref 0.1–1.0)
Monocytes Relative: 10.8 % (ref 3.0–12.0)
Neutro Abs: 3.7 10*3/uL (ref 1.4–7.7)
Neutrophils Relative %: 51.7 % (ref 43.0–77.0)
Platelets: 292 10*3/uL (ref 150.0–400.0)
RBC: 5.85 Mil/uL — ABNORMAL HIGH (ref 4.22–5.81)
RDW: 24.9 % — ABNORMAL HIGH (ref 11.5–15.5)
WBC: 7.1 10*3/uL (ref 4.0–10.5)

## 2023-10-16 LAB — BRAIN NATRIURETIC PEPTIDE: Pro B Natriuretic peptide (BNP): 150 pg/mL — ABNORMAL HIGH (ref 0.0–100.0)

## 2023-10-20 ENCOUNTER — Encounter: Payer: Self-pay | Admitting: Internal Medicine

## 2023-10-20 ENCOUNTER — Ambulatory Visit (HOSPITAL_COMMUNITY)
Admission: RE | Admit: 2023-10-20 | Discharge: 2023-10-20 | Disposition: A | Discharge status: Home / Self Care | Payer: PPO | Source: Ambulatory Visit | Attending: Internal Medicine | Admitting: Internal Medicine

## 2023-10-20 DIAGNOSIS — Z8679 Personal history of other diseases of the circulatory system: Secondary | ICD-10-CM | POA: Insufficient documentation

## 2023-10-20 DIAGNOSIS — Z87891 Personal history of nicotine dependence: Secondary | ICD-10-CM | POA: Insufficient documentation

## 2023-10-20 DIAGNOSIS — I4891 Unspecified atrial fibrillation: Secondary | ICD-10-CM

## 2023-10-20 DIAGNOSIS — Z8669 Personal history of other diseases of the nervous system and sense organs: Secondary | ICD-10-CM | POA: Diagnosis not present

## 2023-10-20 DIAGNOSIS — Z862 Personal history of diseases of the blood and blood-forming organs and certain disorders involving the immune mechanism: Secondary | ICD-10-CM | POA: Diagnosis not present

## 2023-10-20 DIAGNOSIS — R0609 Other forms of dyspnea: Secondary | ICD-10-CM | POA: Diagnosis not present

## 2023-10-20 LAB — ECHOCARDIOGRAM COMPLETE
AR max vel: 2.43 cm2
AV Area VTI: 2.49 cm2
AV Area mean vel: 2.6 cm2
AV Mean grad: 2.8 mm[Hg]
AV Peak grad: 5.6 mm[Hg]
Ao pk vel: 1.19 m/s
Area-P 1/2: 5.54 cm2
S' Lateral: 2.4 cm

## 2023-10-20 NOTE — Progress Notes (Signed)
Mild bronchiectasis . Can give inhaler at followup. Wilfrid Lund ldiscuss more at followup

## 2023-10-20 NOTE — Progress Notes (Signed)
*  PRELIMINARY RESULTS* Echocardiogram 2D Echocardiogram has been performed.  Stacey Drain 10/20/2023, 12:25 PM

## 2023-10-20 NOTE — Progress Notes (Signed)
Blood normal though BNP slightly high. ECHo normal . This suggests stiff heart muscle playing a role in dyspnea. Will discuss more in office

## 2023-10-24 NOTE — Progress Notes (Unsigned)
Cardiology Office Note    Date:  10/27/2023  ID:  Jhett, Fretwell 1945/10/25, MRN [id number] PCP:  Benita Stabile, MD  Cardiologist:  Nona Dell, MD  Electrophysiologist:  None   Chief Complaint: DOE  History of Present Illness: .    Sean Golden is a 78 y.o. male with visit-pertinent history of permanent atrial fibrillation, DOE, moderate LVH by echo, HTN, OSA, GERD, epistaxis, prior severe pancreatitis seen for follow-up. Nuc 2016 was low risk.   He previously required periodic interruption/dose adjustment of his Eliquis due to nosebleeds. Thankfully this has resolved, last episode July 2024, followed with ENT. He also was previously anemic into the 11 range, improved to 16.5 with iron supplementation. He reports longstanding history of DOE that seems to be getting progressively worse, present the last 5 years. Last echo 10/20/23 ordered by pulm showed EF 60-65%, moderate LVH, severe LAE, moderate RAE, trivial MR. The moderate LVH has been seen since back to 2015 as well. He saw pulmonology. High res CT 10/09/23 showed cirrhotic liver, aortic atherosclerosis, coronary calcification, and mild bronchiectasis. PFTs 09/2023 were normal. He was admitted in 2023 with severe pancreatitis and lost a lot of weight at that time in the setting of critical illness. He has steadily been gaining this back and does not do much physical activity. He notes generalized daytime fatigue. He uses CPAP but his machine is very old. He denies any CP, edema, orthopnea, syncope, palpitations. BP 146/84, recheck 130/82 by me. He has not been following this at home lately, last 122/74 in 04/2023 OV.  Labwork independently reviewed: 09/2023 BNP 150, Hgb 16.5, plt 292 KPN 07/2023 TSH OK KPN 06/2023 Cr 0.960, LDL 106, trig 70 03/2023 PT INR WNL 02/2023 LFTs ok 11/2022 TSH ok Lipids followed elsewhere   ROS: .    Please see the history of present illness. All other systems are reviewed and otherwise  negative.  Studies Reviewed: Marland Kitchen    EKG:  EKG is ordered today, personally reviewed, demonstrating atrial fib 88bpm LAFB poor R wave progression unchanged from prior  CV Studies: Cardiac studies reviewed are outlined and summarized above. Otherwise please see EMR for full report.   Current Reported Medications:.    Current Meds  Medication Sig   acetaminophen (TYLENOL) 325 MG tablet Take 2 tablets (650 mg total) by mouth every 6 (six) hours as needed for mild pain, fever or headache (or Fever >/= 101).   apixaban (ELIQUIS) 5 MG TABS tablet Take 1 tablet (5 mg total) by mouth 2 (two) times daily.   diltiazem (CARDIZEM CD) 240 MG 24 hr capsule TAKE ONE CAPSULE (240MG  TOTAL) BY MOUTH DAILY   pantoprazole (PROTONIX) 40 MG tablet Take 1 tablet (40 mg total) by mouth 2 (two) times daily.   prednisoLONE acetate (PRED FORTE) 1 % ophthalmic suspension    tamsulosin (FLOMAX) 0.4 MG CAPS capsule Take 2 capsules (0.8 mg total) by mouth every evening.    Physical Exam:    VS:  BP (!) 146/84 (BP Location: Right Arm, Patient Position: Sitting, Cuff Size: Large)   Pulse 88   Ht 5\' 10"  (1.778 m)   Wt 247 lb (112 kg)   SpO2 97%   BMI 35.44 kg/m    Wt Readings from Last 3 Encounters:  10/27/23 247 lb (112 kg)  10/02/23 245 lb 3.2 oz (111.2 kg)  10/02/23 243 lb 12.8 oz (110.6 kg)    GEN: Well nourished, well developed in no acute distress NECK:  No JVD; No carotid bruits CARDIAC: RRR, no murmurs, rubs, gallops RESPIRATORY:  Clear to auscultation without rales, wheezing or rhonchi  ABDOMEN: Soft, non-tender, non-distended EXTREMITIES:  No edema; No acute deformity   Asessement and Plan:.    1. DOE - longstanding, progressive over the last few years. Suspected multifactorial. 2D echo with normal LVEF, moderate LVH, BNP only 150. Pulm workup as above. Will proceed with Lexiscan nuclear stress test to exclude ischemia. Suboptimal blood pressure control noted today, question whether this is  contributing. We need a better idea of what it's running at home. The patient was provided instructions on monitoring blood pressure at home for 1 week and relaying results to our office. Get BMET. If BP remains suboptimal at home, recommend further antihypertensive therapy, perhaps with thiazide diuretic or MRA contingent on labs. Not a candidate for GLP1 given h/o pancreatitis.  2. Permanent atrial fibrillation - remains rate controlled on diltiazem. Continue apixaban. Await nuclear stress test above. Consider 3 day Zio for HR excursion if nuc unrevealing. Eliquis 5mg  BID dose remains appropriate.  3. LVH, essential HTN - BP plan as outlined above. If workup unrevealing will need to discuss with primary cardiologist whether he needs any additional workup for LVH. Also encouraged him to discuss updating his CPAP/OSA eval with Dr. Marchelle Gearing - he reports his machine is very old, question whether this is contributing.  4. Coronary/aortic atherosclerosis - lipids are managed by PCP. Consider revisiting statin therapy if stress test is abnormal.   5. Cirrhotic liver by CT - patient follows with GI Dr. Marletta Lor. Encouraged him to schedule f/u given this recent CT finding.     Disposition: F/u with me or APP in 6-8 weeks.  Signed, Laurann Montana, PA-C

## 2023-10-27 ENCOUNTER — Other Ambulatory Visit (HOSPITAL_COMMUNITY)
Admission: RE | Admit: 2023-10-27 | Discharge: 2023-10-27 | Disposition: A | Discharge status: Home / Self Care | Payer: PPO | Source: Ambulatory Visit | Attending: Physician Assistant | Admitting: Physician Assistant

## 2023-10-27 ENCOUNTER — Encounter: Payer: Self-pay | Admitting: *Deleted

## 2023-10-27 ENCOUNTER — Encounter: Payer: Self-pay | Admitting: Physician Assistant

## 2023-10-27 ENCOUNTER — Ambulatory Visit: Payer: PPO | Attending: Physician Assistant | Admitting: Physician Assistant

## 2023-10-27 VITALS — BP 130/82 | HR 88 | Ht 70.0 in | Wt 247.0 lb

## 2023-10-27 DIAGNOSIS — K7460 Unspecified cirrhosis of liver: Secondary | ICD-10-CM

## 2023-10-27 DIAGNOSIS — I10 Essential (primary) hypertension: Secondary | ICD-10-CM | POA: Diagnosis not present

## 2023-10-27 DIAGNOSIS — I517 Cardiomegaly: Secondary | ICD-10-CM

## 2023-10-27 DIAGNOSIS — R0609 Other forms of dyspnea: Secondary | ICD-10-CM

## 2023-10-27 DIAGNOSIS — I2510 Atherosclerotic heart disease of native coronary artery without angina pectoris: Secondary | ICD-10-CM

## 2023-10-27 DIAGNOSIS — I700 Atherosclerosis of aorta: Secondary | ICD-10-CM | POA: Diagnosis not present

## 2023-10-27 DIAGNOSIS — I4821 Permanent atrial fibrillation: Secondary | ICD-10-CM

## 2023-10-27 LAB — BASIC METABOLIC PANEL
Anion gap: 10 (ref 5–15)
BUN: 20 mg/dL (ref 8–23)
CO2: 21 mmol/L — ABNORMAL LOW (ref 22–32)
Calcium: 9 mg/dL (ref 8.9–10.3)
Chloride: 107 mmol/L (ref 98–111)
Creatinine, Ser: 0.9 mg/dL (ref 0.61–1.24)
GFR, Estimated: >60 mL/min (ref >60)
Glucose, Bld: 115 mg/dL — ABNORMAL HIGH (ref 70–99)
Potassium: 3.7 mmol/L (ref 3.5–5.1)
Sodium: 138 mmol/L (ref 135–145)

## 2023-10-27 NOTE — Patient Instructions (Addendum)
Medication Instructions:  Your physician recommends that you continue on your current medications as directed. Please refer to the Current Medication list given to you today.  *If you need a refill on your cardiac medications before your next appointment, please call your pharmacy*   Lab Work: Your physician recommends that you return for lab work in: Today [employer])    If you have labs (blood work) drawn today and your tests are completely normal, you will receive your results only by: MyChart Message (if you have MyChart) OR A paper copy in the mail If you have any lab test that is abnormal or we need to change your treatment, we will call you to review the results.   Testing/Procedures: Your physician has requested that you have a lexiscan myoview. For further information please visit [email or web address] Please follow instruction sheet, as given.    Follow-Up: At [facility], you and your health needs are our priority.  As part of our continuing mission to provide you with exceptional heart care, we have created designated Provider Care Teams.  These Care Teams include your primary Cardiologist (physician) and Advanced Practice Providers (APPs -  Physician Assistants and Nurse Practitioners) who all work together to provide you with the care you need, when you need it.  We recommend signing up for the patient portal called "MyChart".  Sign up information is provided on this After Visit Summary.  MyChart is used to connect with patients for Virtual Visits (Telemedicine).  Patients are able to view lab/test results, encounter notes, upcoming appointments, etc.  Non-urgent messages can be sent to your provider as well.   To learn more about what you can do with MyChart, go to [email or web address].    Your next appointment:   6 -8 week(s)  Provider:   You will see one of the following Advanced Practice Providers on your designated Care Team:   Randall An, PA-C   Jacolyn Reedy, PA-C     Other Instructions Thank you for choosing [facility]!

## 2023-10-29 ENCOUNTER — Ambulatory Visit (HOSPITAL_COMMUNITY)
Admission: RE | Admit: 2023-10-29 | Discharge: 2023-10-29 | Disposition: A | Discharge status: Home / Self Care | Payer: PPO | Source: Ambulatory Visit | Attending: Cardiology | Admitting: Cardiology

## 2023-10-29 ENCOUNTER — Encounter (HOSPITAL_COMMUNITY)
Admission: RE | Admit: 2023-10-29 | Discharge: 2023-10-29 | Disposition: A | Discharge status: Home / Self Care | Payer: PPO | Source: Ambulatory Visit | Attending: Physician Assistant | Admitting: Physician Assistant

## 2023-10-29 DIAGNOSIS — R0609 Other forms of dyspnea: Secondary | ICD-10-CM | POA: Diagnosis not present

## 2023-10-29 LAB — NM MYOCAR MULTI W/SPECT W/WALL MOTION / EF
LV dias vol: 66 mL (ref 62–150)
LV sys vol: 19 mL
Nuc Stress EF: 71 %
Peak HR: 84 {beats}/min
RATE: 0.5
Rest HR: 65 {beats}/min
Rest Nuclear Isotope Dose: 10.8 mCi
SDS: 3
SRS: 3
SSS: 6
ST Depression (mm): 0 mm
Stress Nuclear Isotope Dose: 30 mCi
TID: 1.08

## 2023-10-29 MED ORDER — SODIUM CHLORIDE FLUSH 0.9 % IV SOLN
INTRAVENOUS | Status: AC
  Administered 2023-10-29: 10 mL via INTRAVENOUS
  Filled 2023-10-29: qty 10

## 2023-10-29 MED ORDER — REGADENOSON 0.4 MG/5ML IV SOLN
INTRAVENOUS | Status: AC
  Administered 2023-10-29: 0.4 mg via INTRAVENOUS
  Filled 2023-10-29: qty 5

## 2023-10-29 MED ORDER — TECHNETIUM TC 99M TETROFOSMIN IV KIT
30.0000 | PACK | Freq: Once | INTRAVENOUS | Status: AC | PRN
  Administered 2023-10-29: 30 via INTRAVENOUS

## 2023-10-29 MED ORDER — TECHNETIUM TC 99M TETROFOSMIN IV KIT
10.8000 | PACK | Freq: Once | INTRAVENOUS | Status: AC | PRN
  Administered 2023-10-29: 10.8 via INTRAVENOUS

## 2023-11-03 NOTE — Telephone Encounter (Signed)
 Recommend starting spironolactone  25mg  daily every morning with recheck BMET in 1 week Submit record of BP readings at least 3 hours after AM readings after 7-10 days on this new medicine. He may require additional med titration beyond this for BP control but will start one step at at time. Would also offer referral to [facility] in [place] to work on [employer] since he is not a candidate for weight loss injections due to prior h/o pancreatitis (not sure if we have an equivalent clinic in [facility]? If so - OK to refer). If he would prefer to try to work on this on his own, I would suggest he look into something like the DASH DIET (brown book version) to focus on nutritional ways to lower BP and weight. Keep f/u as planned.

## 2023-11-04 ENCOUNTER — Other Ambulatory Visit: Payer: Self-pay

## 2023-11-04 DIAGNOSIS — Z7689 Persons encountering health services in other specified circumstances: Secondary | ICD-10-CM

## 2023-11-04 DIAGNOSIS — Z79899 Other long term (current) drug therapy: Secondary | ICD-10-CM

## 2023-11-04 MED ORDER — SPIRONOLACTONE 25 MG PO TABS: 25.0000 mg | ORAL_TABLET | Freq: Every day | ORAL | 3 refills | Status: DC

## 2023-11-06 DIAGNOSIS — G4733 Obstructive sleep apnea (adult) (pediatric): Secondary | ICD-10-CM | POA: Diagnosis not present

## 2023-11-12 ENCOUNTER — Other Ambulatory Visit (HOSPITAL_COMMUNITY)
Admission: RE | Admit: 2023-11-12 | Discharge: 2023-11-12 | Disposition: A | Discharge status: Home / Self Care | Payer: PPO | Source: Ambulatory Visit | Attending: Physician Assistant | Admitting: Physician Assistant

## 2023-11-12 DIAGNOSIS — Z79899 Other long term (current) drug therapy: Secondary | ICD-10-CM | POA: Diagnosis not present

## 2023-11-12 LAB — BASIC METABOLIC PANEL
Anion gap: 12 (ref 5–15)
BUN: 20 mg/dL (ref 8–23)
CO2: 17 mmol/L — ABNORMAL LOW (ref 22–32)
Calcium: 9 mg/dL (ref 8.9–10.3)
Chloride: 105 mmol/L (ref 98–111)
Creatinine, Ser: 1.11 mg/dL (ref 0.61–1.24)
GFR, Estimated: >60 mL/min (ref >60)
Glucose, Bld: 150 mg/dL — ABNORMAL HIGH (ref 70–99)
Potassium: 4.1 mmol/L (ref 3.5–5.1)
Sodium: 134 mmol/L — ABNORMAL LOW (ref 135–145)

## 2023-11-13 ENCOUNTER — Encounter: Payer: Self-pay | Admitting: Physician Assistant

## 2023-11-13 NOTE — Telephone Encounter (Signed)
 Since BPs still marginally up at times, recommend increasing diltiazem to 300mg  daily - will need new rx, cannot open the 240mg  capsules. Sometimes slowing the HR down can also help with filling time, blood pressures, and sense of shortness of breath.  Regarding spironolactone labs - his sodium and CO2 dropped slightly since initiation of the spironolactone. I'd like him to return for non-fasting recheck on Tuesday or Wednesday at his convenience to ensure stable. It looks like these abnormalities have been seen even back to previous years, though tend to occur when he's fighting something else off, so just want to be sure no acute downtrend.  Bring BP/HR log to f/u with me as scheduled.

## 2023-11-13 NOTE — Patient Instructions (Incomplete)
 ICD-10-CM   1. DOE (dyspnea on exertion)  R06.09     2. History of anemia  Z86.2     3. Former smoker  Z87.891     4. History of atrial fibrillation  Z86.79     5. History of obstructive sleep apnea  Z86.69       Given normal PFT test , physical exam and exercise hypxoemai test I am less concerned about a lung problem causing shorttness of breath I am more concerned about anemia, stif heart muscle, physical deconditonig, weight gain as reasons  Plan  - do HRCT supine and prone  - do cbc with diff and bnp - do ECHO   Followup  - video visit ideally (or face to face) to discuss results Q - can be with APP or Verenice Westrich  - next 3-6 weeks but give 2-3 weeks after CT is done

## 2023-11-13 NOTE — Progress Notes (Unsigned)
 OV 10/02/2023  Subjective:  Patient ID: Sean Golden, male , DOB: Jan 19, 1946 , age 78 y.o. , MRN: [id number] , ADDRESS: [address] [place] [address] PCP Benita Stabile, MD Patient Care Team: Benita Stabile, MD as PCP - General (Internal Medicine) Jonelle Sidle, MD as PCP - Cardiology (Cardiology) West Bali, MD (Inactive) as Consulting Physician (Gastroenterology)  This Provider for this visit: Treatment Team:  Attending Provider: Kalman Shan, MD    10/02/2023 -   Chief Complaint  Patient presents with   Consult    Sob, former smoker, with no inhaler usage      HPI Sean Golden 78 y.o. -he is the father of Kash Davie who I know socially through my wife.[name] order consult requested for worsening shortness of breath.  He is a smoker in the past having quit in the 1980s.  Total smoking load is not significant.  He had an episode of severe pancreatitis approximately 2 years ago in February 2023 which he states he fully recovered from.  He suffers from sleep apnea for which he uses CPAP.  He has atrial fibrillation for which he is on Eliquis.  Last echocardiogram approximately 2 years ago February 2023 with grade 1 diastolic dysfunction.     He says for several years she has had insidious onset of shortness of breath worse with exertion relieved by rest.  But it has been getting worse slow and steady but then in June 2024 he had an episode of epistaxis followed by 3 more in July particularly on March 26, 2024, April 01, 2024 and April 17, 2024.  Apparently got cauterize each time.  Each time he was told this because of Eliquis.  He went to [facility] emergency department.  External medical records reviewed.  Final hemoglobin on April 18, 2023 showed a drop to 12.1 g%.  Kidney function normal.  Since then he followed up with primary care physician in approximately 3 months ago his hemoglobin was 11 g% and he was diagnosed with iron deficiency anemia.  He is  on iron supplementation and he says he is better because of that but he still has having worsening shortness of breath.  Currently shortness of breath is rated severe with exertion relieved by rest.  Symptom burden is extremely high [see below].  In fact when we did a simple sit/stand test on him he got quite short of breath.  He also has a mild cough here and there for the last 1 month.  However there is no chest pain no wheezing no paroxysmal nocturnal dyspnea no orthopnea.  He has baseline mild edema for which there is no change.  He had pulmonary function test today and it is normal  Of note he has gained significant amount of weight back.  He used to weigh baseline to 60 pounds.  With pancreatitis 2 years ago he went down to 220 pounds but is now gained 20 pounds in 2 years and is at around 240 pounds.      CT Chest data from date: 12/23/22 CTa bdomen lung image  - personally visualized and independently interpreted : yes - my findings are: lung bases are clear   LAbs  - July 2024  - mild anemia hgb 12.1gm%   OV 11/14/2023  Subjective:  Patient ID: Sean Golden, male , DOB: 1945/11/19 , age 78 y.o. , MRN: [id number] , ADDRESS: [address] [place] [address] PCP Margo Aye,  Kathleene Hazel, MD Patient Care Team: Benita Stabile, MD as PCP - General (Internal Medicine) Jonelle Sidle, MD as PCP - Cardiology (Cardiology) West Bali, MD (Inactive) as Consulting Physician (Gastroenterology)  This Provider for this visit: Treatment Team:  Attending Provider: Kalman Shan, MD  Type of visit: Video Virtual Visit Identification of patient Sean Golden with [birth year] and MRN [id number] - 2 person identifier Risks: Risks, benefits, limitations of telephone visit explained. Patient understood and verbalized agreement to proceed Anyone else on call: him and daughter Patient location: his house - daugher visiting This provider location: [address];  [place]; [place] [address]. [facility]. [phone]   11/14/2023 - Followup  DYspnea on Exertion - work up in progress     HPI Sean Golden 78 y.o. -presents for followup.  This video visit did not work well.  Ultimately had to switch to telephone to the daughter's cell phone.  He tells me the dyspnea may be slightly better.  But is still present on exertion relieved by rest.  Since the last visit his high-resolution CT chest is essentially clear she has very mild bronchiectasis that I personally visualized.  Pulm function test is normal.  Echocardiogram showed left ventricular hypertrophy and very suggestive of associated diastolic dysfunction of the diastolic parameters were indeterminate.  He did see Ronie Spies the cardiology PA.  He was hypertensive and medicines have been adjusted.  He is now on increased antihypertensive medication regimen.  When I saw him he had anemia with since then her blood test show this is resolved.  Otherwise no new issues.   SYMPTOM SCALE  10/02/2023  Current weight   O2 use ra  Shortness of Breath 0 -> 5 scale with 5 being worst (score 6 If unable to do)  At rest 0  Simple tasks - showers, clothes change, eating, shaving 5  Household (dishes, doing bed, laundry) 5  Shopping 5  Walking level at own pace 5  Walking up Stairs 5  Total (30-36) Dyspnea Score 25  How bad is your cough? 2  How bad is your fatigue 5  How bad is nausea 0  How bad is vomiting?  0  How bad is diarrhea? 0  How bad is anxiety? 0  How bad is depression 0  Any chronic pain - if so where and how bad 0       Simple office walk 224 (66+46 x 2) feet Pod A at [facility] x  3 laps goal with forehead probe 10/02/2023    O2 used ra   Number laps completed Sit stand x 15   Comments about pace x   Resting Pulse Ox/HR 98% and 91/min   Final Pulse Ox/HR 96% and 108/min   Desaturated </= 88% no   Desaturated <= 3% points no   Got Tachycardic >/= 90/min yes   Symptoms at end of  test Very dyspneic   Miscellaneous comments x     CT Chest data from date: * Narrative & Impression  CLINICAL DATA:  Shortness of breath on exertion.  Former smoker.   EXAM: CT CHEST WITHOUT CONTRAST   TECHNIQUE: Multidetector CT imaging of the chest was performed following the standard protocol without intravenous contrast. High resolution imaging of the lungs, as well as inspiratory and expiratory imaging, was performed.   RADIATION DOSE REDUCTION: This exam was performed according to the departmental dose-optimization program which includes automated exposure control, adjustment of the  mA and/or kV according to patient size and/or use of iterative reconstruction technique.   COMPARISON:  10/30/2021.   FINDINGS: Cardiovascular: Atherosclerotic calcification of the aorta, aortic valve and coronary arteries. Heart is at the upper limits of normal in size to mildly enlarged. No pericardial effusion.   Mediastinum/Nodes: No pathologically enlarged mediastinal or axillary lymph nodes. Hilar regions are difficult to definitively evaluate without IV contrast. Esophagus is grossly unremarkable.   Lungs/Pleura: Mild cylindrical bronchiectasis. Negative for subpleural reticulation, traction bronchiectasis/bronchiolectasis, ground glass, architectural distortion or honeycombing. No pleural fluid. Airway is otherwise unremarkable. No significant air trapping.   Upper Abdomen: Tiny left hepatic lobe cyst. No specific follow-up necessary. Liver margin is irregular. Visualized portions of the liver, gallbladder, adrenal glands, kidneys, spleen, pancreas, stomach and bowel are otherwise grossly unremarkable. No upper abdominal adenopathy.   Musculoskeletal: Degenerative changes in the spine.   IMPRESSION: 1. No evidence of interstitial lung disease. 2. Mild cylindrical bronchiectasis. 3. Liver appears cirrhotic. 4. Aortic atherosclerosis (ICD10-I70.0). Coronary  artery calcification.     Electronically Signed   By: Leanna Battles M.D.   On: 10/17/2023 13:13      PFT     Latest Ref Rng & Units 10/02/2023    9:05 AM  PFT Results  FVC-Pre L 4.02   FVC-Predicted Pre % 98   FVC-Post L 3.72   FVC-Predicted Post % 91   Pre FEV1/FVC % % 81   Post FEV1/FCV % % 84   FEV1-Pre L 3.25   FEV1-Predicted Pre % 110   FEV1-Post L 3.12   DLCO uncorrected ml/min/mmHg 26.11   DLCO UNC% % 107   DLCO corrected ml/min/mmHg 26.11   DLCO COR %Predicted % 107   DLVA Predicted % 112   TLC L 7.17   TLC % Predicted % 104   RV % Predicted % 101        LAB RESULTS last 96 hours No results found.   ECHO  IMPRESSIONS     1. Left ventricular ejection fraction, by estimation, is 60 to 65%. The  left ventricle has normal function. The left ventricle has no regional  wall motion abnormalities. There is moderate left ventricular hypertrophy.  Left ventricular diastolic  parameters are indeterminate.   2. Right ventricular systolic function is normal. The right ventricular  size is normal.   3. Left atrial size was severely dilated.   4. Right atrial size was moderately dilated.   5. The mitral valve is normal in structure. Trivial mitral valve  regurgitation. No evidence of mitral stenosis.   6. The aortic valve is tricuspid. Aortic valve regurgitation is not  visualized. No aortic stenosis is present.   7. The inferior vena cava is normal in size with greater than 50%  respiratory variability, suggesting right atrial pressure of 3 mmHg.     has a past medical history of Atrial fibrillation (HCC) (05/2014), Essential hypertension, GERD (gastroesophageal reflux disease), Helicobacter pylori gastritis (08/2014), Pancreatitis, and Sleep apnea.   reports that he quit smoking about 39 years ago. His smoking use included cigarettes. He started smoking about 56 years ago. He has a 8.5 pack-year smoking history. He has been exposed to tobacco smoke. He has  never used smokeless tobacco.  Past Surgical History:  Procedure Laterality Date   CARDIAC ELECTROPHYSIOLOGY STUDY AND ABLATION  2010?   COLONOSCOPY  2005   SOLITARY RECTAL ULCER   COLONOSCOPY N/A 09/06/2014   Dr. Darrick Penna: three colon polyps, moderate diverticulosis throughout the entire examined  colon   ESOPHAGOGASTRODUODENOSCOPY N/A 09/06/2014   Dr. Fields:moderate erosive gastritis, no Barrett's. +H.pylori gastritis, treated with Pylera   ESOPHAGOGASTRODUODENOSCOPY (EGD) WITH PROPOFOL N/A 11/17/2021   Procedure: ESOPHAGOGASTRODUODENOSCOPY (EGD) WITH PROPOFOL;  Surgeon: Malissa Hippo, MD;  Location: [facility];  Service: Endoscopy;  Laterality: N/A;   ESOPHAGOGASTRODUODENOSCOPY (EGD) WITH PROPOFOL N/A 11/27/2021   Procedure: ESOPHAGOGASTRODUODENOSCOPY (EGD) WITH PROPOFOL;  Surgeon: Dolores Frame, MD;  Location: [facility];  Service: Gastroenterology;  Laterality: N/A;   HEMORRHOID BANDING N/A 09/06/2014   NO BANDING PERFORMED. NO INTERNAL HEMORRHOIDS IDENTIFIED    UPPER GASTROINTESTINAL ENDOSCOPY  2005 RMR   VASECTOMY      No Known Allergies  Immunization History  Administered Date(s) Administered   Influenza Inj Mdck Quad Pf 07/16/2016   Influenza, High Dose Seasonal PF 07/29/2017, 06/18/2018, 06/21/2019   Influenza-Unspecified 06/21/2013   Moderna Sars-Covid-2 Vaccination 11/07/2019, 11/07/2019, 12/05/2019   Pneumococcal Polysaccharide-23 08/09/2019    Family History  Problem Relation Age of Onset   Hypertension Mother    Heart attack Father    Hyperlipidemia Sister    Hyperlipidemia Brother    Colon polyps Brother    Hypertension Brother    Hypertension Sister    Diabetes Brother    Stroke Other    Diabetes Other    Hypertension Other    Hyperlipidemia Other    Colon cancer Neg Hx      Current Outpatient Medications:    acetaminophen (TYLENOL) 325 MG tablet, Take 2 tablets (650 mg total) by mouth every 6 (six) hours as needed for mild pain,  fever or headache (or Fever >/= 101)., Disp: , Rfl:    apixaban (ELIQUIS) 5 MG TABS tablet, Take 1 tablet (5 mg total) by mouth 2 (two) times daily., Disp: 60 tablet, Rfl: 5   diltiazem (CARDIZEM CD) 300 MG 24 hr capsule, Take 1 capsule (300 mg total) by mouth daily., Disp: 90 capsule, Rfl: 3   pantoprazole (PROTONIX) 40 MG tablet, Take 1 tablet (40 mg total) by mouth 2 (two) times daily., Disp: 60 tablet, Rfl: 11   prednisoLONE acetate (PRED FORTE) 1 % ophthalmic suspension, , Disp: , Rfl:    spironolactone (ALDACTONE) 25 MG tablet, Take 1 tablet (25 mg total) by mouth daily., Disp: 90 tablet, Rfl: 3   tamsulosin (FLOMAX) 0.4 MG CAPS capsule, Take 2 capsules (0.8 mg total) by mouth every evening., Disp: 60 capsule, Rfl: 2      Objective:   There were no vitals filed for this visit.  Estimated body mass index is 35.44 kg/m as calculated from the following:   Height as of 10/27/23: 5\' 10"  (1.778 m).   Weight as of 10/27/23: 247 lb (112 kg).  @WEIGHTCHANGE @  There were no vitals filed for this visit.   Physical Exam   General: No distress.      Assessment:       ICD-10-CM   1. Bronchiectasis without complication (HCC)  J47.9     2. Dyspnea on exertion  R06.09          Plan:     Patient Instructions     ICD-10-CM   1. Bronchiectasis without complication (HCC)  J47.9     2. Dyspnea on exertion  R06.09     '  Try spiriva sample x   1  months  FOllowup  - video visit APP in 1 month; if no response - then do CPST wit eIb challenge   FOLLOWUP Return in about 4 weeks (  around 12/12/2023) for with any of the APPS, VIDEO VISIT, Dyspnea.    SIGNATURE    Dr. Kalman Shan, M.D., F.C.C.P,  Pulmonary and Critical Care Medicine Staff Physician, [facility] Director - Interstitial Lung Disease  Program  Pulmonary Fibrosis [facility] at [facility], [place], [address]  Pager: [phone], If no answer or between  15:00h -  7:00h: call [phone] Telephone: [phone]  4:01 PM 11/14/2023

## 2023-11-13 NOTE — Progress Notes (Addendum)
 Regarding weight mgmt referral, received this msg: "[place], Chase Picket, Tacey Ruiz, PA-C Good afternoon, This referral was put in our work que, however we are not MWM, and due to the patients diagnosis medicare will not cover the cost of the visit unless the patient has DM or CKD (3) Thank you"  I think this might have been placed into the wrong office - can we make sure it goes to [facility]? Thank you!

## 2023-11-14 ENCOUNTER — Telehealth: Payer: PPO | Admitting: Internal Medicine

## 2023-11-14 ENCOUNTER — Telehealth: Payer: Self-pay | Admitting: Internal Medicine

## 2023-11-14 ENCOUNTER — Telehealth: Payer: Self-pay

## 2023-11-14 DIAGNOSIS — J479 Bronchiectasis, uncomplicated: Secondary | ICD-10-CM

## 2023-11-14 DIAGNOSIS — R0609 Other forms of dyspnea: Secondary | ICD-10-CM

## 2023-11-14 DIAGNOSIS — Z79899 Other long term (current) drug therapy: Secondary | ICD-10-CM

## 2023-11-14 MED ORDER — DILTIAZEM HCL ER COATED BEADS 300 MG PO CP24: 300.0000 mg | ORAL_CAPSULE | Freq: Every day | ORAL | 3 refills | Status: DC

## 2023-11-14 NOTE — Telephone Encounter (Signed)
 Sean Montana, PA-C     11/13/23  3:02 PM Note Since BPs still marginally up at times, recommend increasing diltiazem to 300mg  daily - will need new rx, cannot open the 240mg  capsules. Sometimes slowing the HR down can also help with filling time, blood pressures, and sense of shortness of breath.   Regarding spironolactone labs - his sodium and CO2 dropped slightly since initiation of the spironolactone. I'd like him to return for non-fasting recheck on Tuesday or Wednesday at his convenience to ensure stable. It looks like these abnormalities have been seen even back to previous years, though tend to occur when he's fighting something else off, so just want to be sure no acute downtrend.   Bring BP/HR log to f/u with me as scheduled.           The patient has been notified of the result and verbalized understanding.  All questions (if any) were answered. Sean Riis, RN 11/14/2023 10:20 AM     Order placed for repeat bmet next week at [facility]. New rx for diltiazem 300 mg daily to [facility].

## 2023-11-14 NOTE — Telephone Encounter (Signed)
 LEsie  Give low dose's  SPIRIVA Samples for 1 month.  After that please make sure there is a video visit with nurse practitioner.  He lives in [place] so example can be given in [place] or his daughter who lives in [place] can pick it up

## 2023-11-18 ENCOUNTER — Other Ambulatory Visit: Payer: Self-pay

## 2023-11-18 DIAGNOSIS — Z7689 Persons encountering health services in other specified circumstances: Secondary | ICD-10-CM

## 2023-11-18 NOTE — Telephone Encounter (Signed)
 Recs called to patient via phone note 2/21

## 2023-11-18 NOTE — Progress Notes (Signed)
 New referral entered.

## 2023-11-19 ENCOUNTER — Other Ambulatory Visit: Payer: Self-pay | Admitting: *Deleted

## 2023-11-19 ENCOUNTER — Other Ambulatory Visit (HOSPITAL_COMMUNITY)
Admission: RE | Admit: 2023-11-19 | Discharge: 2023-11-19 | Disposition: A | Discharge status: Home / Self Care | Payer: PPO | Source: Ambulatory Visit | Attending: Physician Assistant | Admitting: Physician Assistant

## 2023-11-19 DIAGNOSIS — Z79899 Other long term (current) drug therapy: Secondary | ICD-10-CM | POA: Insufficient documentation

## 2023-11-19 LAB — BASIC METABOLIC PANEL
Anion gap: 11 (ref 5–15)
BUN: 19 mg/dL (ref 8–23)
CO2: 19 mmol/L — ABNORMAL LOW (ref 22–32)
Calcium: 9 mg/dL (ref 8.9–10.3)
Chloride: 104 mmol/L (ref 98–111)
Creatinine, Ser: 1.11 mg/dL (ref 0.61–1.24)
GFR, Estimated: >60 mL/min (ref >60)
Glucose, Bld: 130 mg/dL — ABNORMAL HIGH (ref 70–99)
Potassium: 4.5 mmol/L (ref 3.5–5.1)
Sodium: 134 mmol/L — ABNORMAL LOW (ref 135–145)

## 2023-11-19 MED ORDER — SPIRIVA RESPIMAT 1.25 MCG/ACT IN AERS: 2.0000 | INHALATION_SPRAY | Freq: Every day | RESPIRATORY_TRACT | Status: DC

## 2023-11-19 NOTE — Telephone Encounter (Signed)
 Pt given samples of spiriva 1.25 and appt scheduled for

## 2023-11-25 ENCOUNTER — Telehealth: Payer: PPO | Admitting: Internal Medicine

## 2023-12-15 NOTE — Progress Notes (Unsigned)
 Cardiology Office Note    Date:  12/16/2023  ID:  Sean Golden 01-17-1946, MRN [id number] PCP:  Benita Stabile, MD  Cardiologist:  Nona Dell, MD  Electrophysiologist:  None   Chief Complaint: f/u testing, med adjustment  History of Present Illness: .    Sean Golden is a 78 y.o. male with visit-pertinent history of permanent atrial fibrillation, DOE, moderate LVH by echo, HTN, OSA, GERD, epistaxis, prior severe pancreatitis seen for follow-up. Nuc 2016 was low risk.  He previously required periodic interruption/dose adjustment of his Eliquis due to nosebleeds. Thankfully this has resolved, last episode July 2024, followed with ENT. He also was previously anemic into the 11 range, improved to 16.5 with iron supplementation. He has had longstanding history of DOE, present the last 5 years. Echo 10/20/23 ordered by pulmonology showed EF 60-65%, moderate LVH, severe LAE, moderate RAE, trivial MR. The moderate LVH has been seen since back to 2015 as well. He recently followed up with pulmonology for dyspnea. High res CT 09/2023 showed cirrhotic liver, aortic atherosclerosis, coronary calcification, and mild bronchiectasis. PFTs 09/2023 were normal. He was admitted in 2023 with severe pancreatitis and lost a lot of weight at that time in the setting of critical illness. He has steadily been gaining this back and does not do much physical activity. He notes generalized daytime fatigue. He uses CPAP. He denied any recent chest pain. We arranged nuclear stress test 10/2023 which was negative for ischemia, EF 71%. We added spironolactone and increased diltiazem to help with BP & HR excursion. He was also encouraged to f/u GI given question of cirrhotic liver on CT.  He is seen for follow-up today reporting that his dyspnea has improved since last visit. He notices his fatigue is much less and he has more endurance. No chest pain or edema. No syncope. Does not follow BP at home. BP by RN 130/78,  recheck by me 122/82.  Labwork independently reviewed: 10/2023 Na 134, K 4.5, Cr 1.11 09/2023 BNP 150, Hgb 16.5, plt 292 KPN 07/2023 TSH OK KPN 06/2023 Cr 0.960, LDL 106, trig 70 03/2023 PT INR WNL 02/2023 LFTs ok  ROS: .    Please see the history of present illness.  All other systems are reviewed and otherwise negative.  Studies Reviewed: Marland Kitchen    EKG:  EKG is not ordered today  CV Studies: Cardiac studies reviewed are outlined and summarized above. Otherwise please see EMR for full report.   Current Reported Medications:.    Current Meds  Medication Sig   acetaminophen (TYLENOL) 325 MG tablet Take 2 tablets (650 mg total) by mouth every 6 (six) hours as needed for mild pain, fever or headache (or Fever >/= 101).   apixaban (ELIQUIS) 5 MG TABS tablet Take 1 tablet (5 mg total) by mouth 2 (two) times daily.   diltiazem (CARDIZEM CD) 300 MG 24 hr capsule Take 1 capsule (300 mg total) by mouth daily.   pantoprazole (PROTONIX) 40 MG tablet Take 1 tablet (40 mg total) by mouth 2 (two) times daily.   prednisoLONE acetate (PRED FORTE) 1 % ophthalmic suspension    spironolactone (ALDACTONE) 25 MG tablet Take 1 tablet (25 mg total) by mouth daily.   tamsulosin (FLOMAX) 0.4 MG CAPS capsule Take 2 capsules (0.8 mg total) by mouth every evening.   Tiotropium Bromide Monohydrate (SPIRIVA RESPIMAT) 1.25 MCG/ACT AERS Inhale 2 puffs into the lungs daily.    Physical Exam:    VS:  BP  130/78 (BP Location: Left Arm, Patient Position: Sitting, Cuff Size: Large)   Pulse 70   Ht 5\' 10"  (1.778 m)   Wt 241 lb 12.8 oz (109.7 kg)   SpO2 97%   BMI 34.69 kg/m    Wt Readings from Last 3 Encounters:  12/16/23 241 lb 12.8 oz (109.7 kg)  10/27/23 247 lb (112 kg)  10/02/23 245 lb 3.2 oz (111.2 kg)    GEN: Well nourished, well developed in no acute distress NECK: No JVD; No carotid bruits CARDIAC: RRR, no murmurs, rubs, gallops RESPIRATORY:  Clear to auscultation without rales, wheezing or rhonchi   ABDOMEN: Soft, non-tender, non-distended EXTREMITIES:  No edema; No acute deformity   Asessement and Plan:.    1. Dyspnea on exertion - suspect multifactorial in setting of obesity, diastolic dysfunction, permanent atrial fibrillation, bronchiectasis and HTN. Improved with addition of spironolactone and titration of diltiazem to 300mg  daily. Continue present regimen. Surveillance BMET today then plan 6 month follow-up.  2. Permanent atrial fibrillation-  managed with rate control strategy, doing well on diltiazem 300mg  daily. Continue this along with Eliquis 5mg  BID.  3. Essential HTN, LVH - BP improved with changes made above. Recheck 122/82 by me. Advised he continue to follow regularly at home and notify if he tends to run 130 systolic or higher. LVH is longstanding back to many prior echoes, suspect due to HTN. Per d/w Dr. Diona Browner, no additional testing needed at this time. The patient is aware to notify for any worsening symptoms. He does not otherwise really have systemic stigmata of infiltrative disease.  4. Coronary/aortic atherosclerosis - recent nuclear stress test was normal. Lipids historically followed by PCP. We discussed initiation of statin therapy and he wishes to defer. Encouraged him to discuss with PCP in follow-up if he changes his mind. Also encouraged f/u GI as previously mentioned given question of cirrhotic changes on CT.   Disposition: F/u with Dr. Diona Browner in 6 months.  Signed, Laurann Montana, PA-C

## 2023-12-16 ENCOUNTER — Other Ambulatory Visit (HOSPITAL_COMMUNITY)
Admission: RE | Admit: 2023-12-16 | Discharge: 2023-12-16 | Disposition: A | Discharge status: Home / Self Care | Source: Ambulatory Visit | Attending: Physician Assistant | Admitting: Physician Assistant

## 2023-12-16 ENCOUNTER — Ambulatory Visit: Payer: PPO | Attending: Physician Assistant | Admitting: Physician Assistant

## 2023-12-16 ENCOUNTER — Encounter: Payer: Self-pay | Admitting: Physician Assistant

## 2023-12-16 VITALS — BP 122/82 | HR 70 | Ht 70.0 in | Wt 241.8 lb

## 2023-12-16 DIAGNOSIS — I4821 Permanent atrial fibrillation: Secondary | ICD-10-CM

## 2023-12-16 DIAGNOSIS — I700 Atherosclerosis of aorta: Secondary | ICD-10-CM | POA: Diagnosis not present

## 2023-12-16 DIAGNOSIS — R0609 Other forms of dyspnea: Secondary | ICD-10-CM | POA: Insufficient documentation

## 2023-12-16 DIAGNOSIS — I10 Essential (primary) hypertension: Secondary | ICD-10-CM | POA: Diagnosis not present

## 2023-12-16 DIAGNOSIS — I2510 Atherosclerotic heart disease of native coronary artery without angina pectoris: Secondary | ICD-10-CM

## 2023-12-16 DIAGNOSIS — I517 Cardiomegaly: Secondary | ICD-10-CM | POA: Diagnosis not present

## 2023-12-16 LAB — BASIC METABOLIC PANEL
Anion gap: 11 (ref 5–15)
BUN: 19 mg/dL (ref 8–23)
CO2: 19 mmol/L — ABNORMAL LOW (ref 22–32)
Calcium: 9.3 mg/dL (ref 8.9–10.3)
Chloride: 105 mmol/L (ref 98–111)
Creatinine, Ser: 0.98 mg/dL (ref 0.61–1.24)
GFR, Estimated: >60 mL/min (ref >60)
Glucose, Bld: 128 mg/dL — ABNORMAL HIGH (ref 70–99)
Potassium: 4 mmol/L (ref 3.5–5.1)
Sodium: 135 mmol/L (ref 135–145)

## 2023-12-16 NOTE — Patient Instructions (Signed)
 Medication Instructions:  Your physician recommends that you continue on your current medications as directed. Please refer to the Current Medication list given to you today.  Labwork: BMET today at [facility]  Testing/Procedures: none  Follow-Up: Your physician recommends that you schedule a follow-up appointment in: 6 months with Diona Browner  Any Other Special Instructions Will Be Listed Below (If Applicable).  If you need a refill on your cardiac medications before your next appointment, please call your pharmacy.

## 2023-12-25 ENCOUNTER — Telehealth: Admitting: Internal Medicine

## 2023-12-25 ENCOUNTER — Telehealth: Payer: PPO | Admitting: Internal Medicine

## 2023-12-25 DIAGNOSIS — Z87891 Personal history of nicotine dependence: Secondary | ICD-10-CM

## 2023-12-25 DIAGNOSIS — R0609 Other forms of dyspnea: Secondary | ICD-10-CM

## 2023-12-25 DIAGNOSIS — J479 Bronchiectasis, uncomplicated: Secondary | ICD-10-CM | POA: Diagnosis not present

## 2023-12-25 NOTE — Progress Notes (Signed)
 OV 10/02/2023  Subjective:  Patient ID: Sean Golden, male , DOB: [birth year] , age 78 y.o. , MRN: [id number] , ADDRESS: [address] [place] [address] PCP Sean Stabile, MD Patient Care Team: Sean Stabile, MD as PCP - General (Internal Medicine) Sean Sidle, MD as PCP - Cardiology (Cardiology) Sean Bali, MD (Inactive) as Consulting Physician (Gastroenterology)  This Provider for this visit: Treatment Team:  Attending Provider: Kalman Shan, MD    10/02/2023 -   Chief Complaint  Patient presents with   Consult    Sob, former smoker, with no inhaler usage      HPI Sean Golden 78 y.o. -he is the father of Sean Golden who I know socially through my wife.Sean Golden order consult requested for worsening shortness of breath.  He is a smoker in the past having quit in the 1980s.  Total smoking load is not significant.  He had an episode of severe pancreatitis approximately 2 years ago in February 2023 which he states he fully recovered from.  He suffers from sleep apnea for which he uses CPAP.  He has atrial fibrillation for which he is on Eliquis.  Last echocardiogram approximately 2 years ago February 2023 with grade 1 diastolic dysfunction.     He says for several years she has had insidious onset of shortness of breath worse with exertion relieved by rest.  But it has been getting worse slow and steady but then in June 2024 he had an episode of epistaxis followed by 3 more in July particularly on March 26, 2024, April 01, 2024 and April 17, 2024.  Apparently got cauterize each time.  Each time he was told this because of Eliquis.  He went to [facility] emergency department.  External medical records reviewed.  Final hemoglobin on April 18, 2023 showed a drop to 12.1 g%.  Kidney function normal.  Since then he followed up with primary care physician in approximately 3 months ago his hemoglobin was 11 g% and he was diagnosed with iron deficiency anemia.  He  is on iron supplementation and he says he is better because of that but he still has having worsening shortness of breath.  Currently shortness of breath is rated severe with exertion relieved by rest.  Symptom burden is extremely high [see below].  In fact when we did a simple sit/stand test on him he got quite short of breath.  He also has a mild cough here and there for the last 1 month.  However there is no chest pain no wheezing no paroxysmal nocturnal dyspnea no orthopnea.  He has baseline mild edema for which there is no change.  He had pulmonary function test today and it is normal  Of note he has gained significant amount of weight back.  He used to weigh baseline to 60 pounds.  With pancreatitis 2 years ago he went down to 220 pounds but is now gained 20 pounds in 2 years and is at around 240 pounds.      CT Chest data from date: 12/23/22 CTa bdomen lung image  - personally visualized and independently interpreted : yes - my findings are: lung bases are clear   LAbs  - July 2024  - mild anemia hgb 12.1gm%   OV 11/14/2023  Subjective:  Patient ID: Sean Golden, male , DOB: 1945-10-12 , age 55 y.o. , MRN: [id number] , ADDRESS: [address] [place] [address] PCP  Sean Stabile, MD Patient Care Team: Sean Stabile, MD as PCP - General (Internal Medicine) Sean Sidle, MD as PCP - Cardiology (Cardiology) Sean Bali, MD (Inactive) as Consulting Physician (Gastroenterology)  This Provider for this visit: Treatment Team:  Attending Provider: Kalman Shan, MD  Type of visit: Video Virtual Visit Identification of patient Sean Golden with Sep 18, 1946 and MRN [id number] - 2 person identifier Risks: Risks, benefits, limitations of telephone visit explained. Patient understood and verbalized agreement to proceed Anyone else on call: him and daughter Patient location: his house - daugher visiting This provider location: [address];  [place]; [place] [address]. [facility]. [phone]   11/14/2023 - Followup  DYspnea on Exertion - work up in progress     HPI Sean Golden 78 y.o. -presents for followup.  This video visit did not work well.  Ultimately had to switch to telephone to the daughter's cell phone.  He tells me the dyspnea may be slightly better.  But is still present on exertion relieved by rest.  Since the last visit his high-resolution CT chest is essentially clear she has very mild bronchiectasis that I personally visualized.  Pulm function test is normal.  Echocardiogram showed left ventricular hypertrophy and very suggestive of associated diastolic dysfunction of the diastolic parameters were indeterminate.  He did see Sean Golden the cardiology PA.  He was hypertensive and medicines have been adjusted.  He is now on increased antihypertensive medication regimen.  When I saw him he had anemia with since then her blood test show this is resolved.  Otherwise no new issues.   CT Chest data from date: * Narrative & Impression  CLINICAL DATA:  Shortness of breath on exertion.  Former smoker.   EXAM: CT CHEST WITHOUT CONTRAST   TECHNIQUE: Multidetector CT imaging of the chest was performed following the standard protocol without intravenous contrast. High resolution imaging of the lungs, as well as inspiratory and expiratory imaging, was performed.   RADIATION DOSE REDUCTION: This exam was performed according to the departmental dose-optimization program which includes automated exposure control, adjustment of the mA and/or kV according to patient size and/or use of iterative reconstruction technique.   COMPARISON:  10/30/2021.   FINDINGS: Cardiovascular: Atherosclerotic calcification of the aorta, aortic valve and coronary arteries. Heart is at the upper limits of normal in size to mildly enlarged. No pericardial effusion.   Mediastinum/Nodes: No pathologically enlarged mediastinal  or axillary lymph nodes. Hilar regions are difficult to definitively evaluate without IV contrast. Esophagus is grossly unremarkable.   Lungs/Pleura: Mild cylindrical bronchiectasis. Negative for subpleural reticulation, traction bronchiectasis/bronchiolectasis, ground glass, architectural distortion or honeycombing. No pleural fluid. Airway is otherwise unremarkable. No significant air trapping.   Upper Abdomen: Tiny left hepatic lobe cyst. No specific follow-up necessary. Liver margin is irregular. Visualized portions of the liver, gallbladder, adrenal glands, kidneys, spleen, pancreas, stomach and bowel are otherwise grossly unremarkable. No upper abdominal adenopathy.   Musculoskeletal: Degenerative changes in the spine.   IMPRESSION: 1. No evidence of interstitial lung disease. 2. Mild cylindrical bronchiectasis. 3. Liver appears cirrhotic. 4. Aortic atherosclerosis (ICD10-I70.0). Coronary artery calcification.     Electronically Signed   By: Leanna Battles M.D.   On: 10/17/2023 13:13      PFT  OV 12/25/2023  Subjective:  Patient ID: Sean Golden, male , DOB: 1946/03/18 , age 70 y.o. , MRN: [id number] , ADDRESS: [address] [place] [address] PCP Nita Sells  Z, MD Patient Care Team: Sean Stabile, MD as PCP - General (Internal Medicine) Sean Sidle, MD as PCP - Cardiology (Cardiology) Sean Bali, MD (Inactive) as Consulting Physician (Gastroenterology)  This Provider for this visit: Treatment Team:  Attending Provider: Kalman Shan, MD  Type of visit: Video Virtual Visit Identification of patient Sean Golden with May 04, 1946 and MRN [id number] - 2 person identifier Risks: Risks, benefits, limitations of telephone visit explained. Patient understood and verbalized agreement to proceed Anyone else on call: jsut hm Patient location: his home This provider location: [address]; [place]; [place] [address]. [facility]. [phone]   12/25/2023 -   Chief Complaint  Patient presents with   Follow-up    Breathing has improved.      HPI Sean Golden 78 y.o. -dyspnea on exertion.  Last visit I gave him some Spiriva for some very mild bronchiectasis use it for 10 days he thought there was a little difference but not much.  In any event he stopped it because of fear of side effects.  But he says overall shortness of breath is improved.  He believes his iron pills that he took to correct his anemia [hemoglobin of 16] help relieve his dyspnea.  I also noted to him that he did do a improvement with blood pressure by taking more blood pressure medications.  I did explain to him that likely he was having diastolic dysfunction because of being overweight and hypertension and this was playing a role as well.  He verbalized understanding.       SYMPTOM SCALE  10/02/2023  Current weight   O2 use ra  Shortness of Breath 0 -> 5 scale with 5 being worst (score 6 If unable to do)  At rest 0  Simple tasks - showers, clothes change, eating, shaving 5  Household (dishes, doing bed, laundry) 5  Shopping 5  Walking level at own pace 5  Walking up Stairs 5  Total (30-36) Dyspnea Score 25  How bad is your cough? 2  How bad is your fatigue 5  How bad is nausea 0  How bad is vomiting?  0  How bad is diarrhea? 0  How bad is anxiety? 0  How bad is depression 0  Any chronic pain - if so where and how bad 0       Simple office walk 224 (66+46 x 2) feet Pod A at [facility] x  3 laps goal with forehead probe 10/02/2023    O2 used ra   Number laps completed Sit stand x 15   Comments about pace x   Resting Pulse Ox/HR 98% and 91/min   Final Pulse Ox/HR 96% and 108/min   Desaturated </= 88% no   Desaturated <= 3% points no   Got Tachycardic >/= 90/min yes   Symptoms at end of test Very dyspneic   Miscellaneous comments x        PFT     Latest Ref Rng & Units 10/02/2023    9:05 AM  PFT Results   FVC-Pre L 4.02   FVC-Predicted Pre % 98   FVC-Post L 3.72   FVC-Predicted Post % 91   Pre FEV1/FVC % % 81   Post FEV1/FCV % % 84   FEV1-Pre L 3.25   FEV1-Predicted Pre % 110   FEV1-Post L 3.12   DLCO uncorrected ml/min/mmHg 26.11   DLCO UNC% % 107   DLCO corrected ml/min/mmHg  26.11   DLCO COR %Predicted % 107   DLVA Predicted % 112   TLC L 7.17   TLC % Predicted % 104   RV % Predicted % 101        LAB RESULTS last 96 hours No results found.       has a past medical history of Atrial fibrillation (HCC) (05/2014), Essential hypertension, GERD (gastroesophageal reflux disease), Helicobacter pylori gastritis (08/2014), Pancreatitis, and Sleep apnea.   reports that he quit smoking about 39 years ago. His smoking use included cigarettes. He started smoking about 56 years ago. He has a 8.5 pack-year smoking history. He has been exposed to tobacco smoke. He has never used smokeless tobacco.  Past Surgical History:  Procedure Laterality Date   CARDIAC ELECTROPHYSIOLOGY STUDY AND ABLATION  2010?   COLONOSCOPY  2005   SOLITARY RECTAL ULCER   COLONOSCOPY N/A 09/06/2014   Dr. Darrick Penna: three colon polyps, moderate diverticulosis throughout the entire examined colon   ESOPHAGOGASTRODUODENOSCOPY N/A 09/06/2014   Dr. Fields:moderate erosive gastritis, no Barrett's. +H.pylori gastritis, treated with Pylera   ESOPHAGOGASTRODUODENOSCOPY (EGD) WITH PROPOFOL N/A 11/17/2021   Procedure: ESOPHAGOGASTRODUODENOSCOPY (EGD) WITH PROPOFOL;  Surgeon: Malissa Hippo, MD;  Location: [facility];  Service: Endoscopy;  Laterality: N/A;   ESOPHAGOGASTRODUODENOSCOPY (EGD) WITH PROPOFOL N/A 11/27/2021   Procedure: ESOPHAGOGASTRODUODENOSCOPY (EGD) WITH PROPOFOL;  Surgeon: Dolores Frame, MD;  Location: [facility];  Service: Gastroenterology;  Laterality: N/A;   HEMORRHOID BANDING N/A 09/06/2014   NO BANDING PERFORMED. NO INTERNAL HEMORRHOIDS IDENTIFIED    UPPER GASTROINTESTINAL ENDOSCOPY   2005 RMR   VASECTOMY      No Known Allergies  Immunization History  Administered Date(s) Administered   Influenza Inj Mdck Quad Pf 07/16/2016   Influenza, High Dose Seasonal PF 07/29/2017, 06/18/2018, 06/21/2019   Influenza-Unspecified 06/21/2013   Moderna Sars-Covid-2 Vaccination 11/07/2019, 11/07/2019, 12/05/2019   Pneumococcal Polysaccharide-23 08/09/2019    Family History  Problem Relation Age of Onset   Hypertension Mother    Heart attack Father    Hyperlipidemia Sister    Hyperlipidemia Brother    Colon polyps Brother    Hypertension Brother    Hypertension Sister    Diabetes Brother    Stroke Other    Diabetes Other    Hypertension Other    Hyperlipidemia Other    Colon cancer Neg Hx      Current Outpatient Medications:    acetaminophen (TYLENOL) 325 MG tablet, Take 2 tablets (650 mg total) by mouth every 6 (six) hours as needed for mild pain, fever or headache (or Fever >/= 101)., Disp: , Rfl:    apixaban (ELIQUIS) 5 MG TABS tablet, Take 1 tablet (5 mg total) by mouth 2 (two) times daily., Disp: 60 tablet, Rfl: 5   diltiazem (CARDIZEM CD) 300 MG 24 hr capsule, Take 1 capsule (300 mg total) by mouth daily., Disp: 90 capsule, Rfl: 3   pantoprazole (PROTONIX) 40 MG tablet, Take 1 tablet (40 mg total) by mouth 2 (two) times daily., Disp: 60 tablet, Rfl: 11   prednisoLONE acetate (PRED FORTE) 1 % ophthalmic suspension, , Disp: , Rfl:    spironolactone (ALDACTONE) 25 MG tablet, Take 1 tablet (25 mg total) by mouth daily., Disp: 90 tablet, Rfl: 3   tamsulosin (FLOMAX) 0.4 MG CAPS capsule, Take 2 capsules (0.8 mg total) by mouth every evening., Disp: 60 capsule, Rfl: 2      Objective:   There were no vitals filed for this visit.  Estimated  body mass index is 34.69 kg/m as calculated from the following:   Height as of 12/16/23: 5\' 10"  (1.778 m).   Weight as of 12/16/23: 241 lb 12.8 oz (109.7 kg).  @WEIGHTCHANGE @  There were no vitals filed for this visit.    Physical Exam   General: No distress. Looks well O2 at rest: [employer] present: no Sitting in wheel chair: no Frail: no Obese: yes Neuro: Alert and Oriented x 3. GCS 15. Speech normal Psych: Pleasant =       Assessment:       ICD-10-CM   1. Dyspnea on exertion  R06.09     2. Bronchiectasis without complication (HCC)  J47.9          Plan:     Patient Instructions     ICD-10-CM   1. Dyspnea on exertion  R06.09     2. Bronchiectasis without complication (HCC)  J47.9      Glad you are better with the shortness of breath after correction of the anemia and improved blood pressure control. Noted Spiriva only helped a little bit after 10 days of use   Plan - Reassured but lose weight  Follow-up  -1 year but if you get worse we can do cardiopulmonary stress test   FOLLOWUP Return in about 1 year (around 12/24/2024) for 15 min visit, with Dr Marchelle Gearing, Face to Face Visit.    SIGNATURE    Dr. Kalman Golden, M.D., F.C.C.P,  Pulmonary and Critical Care Medicine Staff Physician, [facility] Center Director - Interstitial Lung Disease  Program  Pulmonary Fibrosis [facility] at [facility] [place], [place], [address]  Pager: [phone], If no answer or between  15:00h - 7:00h: call [phone] Telephone: [phone]  4:06 PM 12/25/2023

## 2023-12-25 NOTE — Patient Instructions (Addendum)
 ICD-10-CM   1. Dyspnea on exertion  R06.09     2. Bronchiectasis without complication (HCC)  J47.9      Glad you are better with the shortness of breath after correction of the anemia and improved blood pressure control. Noted Spiriva only helped a little bit after 10 days of use   Plan - Reassured but lose weight  Follow-up  -1 year but if you get worse we can do cardiopulmonary stress test

## 2024-01-28 DIAGNOSIS — R7303 Prediabetes: Secondary | ICD-10-CM | POA: Diagnosis not present

## 2024-01-28 DIAGNOSIS — Z125 Encounter for screening for malignant neoplasm of prostate: Secondary | ICD-10-CM | POA: Diagnosis not present

## 2024-01-28 DIAGNOSIS — E782 Mixed hyperlipidemia: Secondary | ICD-10-CM | POA: Diagnosis not present

## 2024-02-03 DIAGNOSIS — I10 Essential (primary) hypertension: Secondary | ICD-10-CM | POA: Diagnosis not present

## 2024-02-03 DIAGNOSIS — H409 Unspecified glaucoma: Secondary | ICD-10-CM | POA: Diagnosis not present

## 2024-02-03 DIAGNOSIS — D6869 Other thrombophilia: Secondary | ICD-10-CM | POA: Diagnosis not present

## 2024-02-03 DIAGNOSIS — K219 Gastro-esophageal reflux disease without esophagitis: Secondary | ICD-10-CM | POA: Diagnosis not present

## 2024-02-03 DIAGNOSIS — I482 Chronic atrial fibrillation, unspecified: Secondary | ICD-10-CM | POA: Diagnosis not present

## 2024-02-03 DIAGNOSIS — K8501 Idiopathic acute pancreatitis with uninfected necrosis: Secondary | ICD-10-CM | POA: Diagnosis not present

## 2024-02-03 DIAGNOSIS — G4733 Obstructive sleep apnea (adult) (pediatric): Secondary | ICD-10-CM | POA: Diagnosis not present

## 2024-02-03 DIAGNOSIS — H18221 Idiopathic corneal edema, right eye: Secondary | ICD-10-CM | POA: Diagnosis not present

## 2024-02-03 DIAGNOSIS — R3915 Urgency of urination: Secondary | ICD-10-CM | POA: Diagnosis not present

## 2024-02-03 DIAGNOSIS — R7303 Prediabetes: Secondary | ICD-10-CM | POA: Diagnosis not present

## 2024-02-03 DIAGNOSIS — D649 Anemia, unspecified: Secondary | ICD-10-CM | POA: Diagnosis not present

## 2024-02-03 DIAGNOSIS — E782 Mixed hyperlipidemia: Secondary | ICD-10-CM | POA: Diagnosis not present

## 2024-02-17 ENCOUNTER — Encounter: Payer: Self-pay | Admitting: Internal Medicine

## 2024-03-05 DIAGNOSIS — Z961 Presence of intraocular lens: Secondary | ICD-10-CM | POA: Diagnosis not present

## 2024-03-05 DIAGNOSIS — H1820 Unspecified corneal edema: Secondary | ICD-10-CM | POA: Diagnosis not present

## 2024-03-05 DIAGNOSIS — Z9841 Cataract extraction status, right eye: Secondary | ICD-10-CM | POA: Diagnosis not present

## 2024-03-05 DIAGNOSIS — H21261 Iris atrophy (essential) (progressive), right eye: Secondary | ICD-10-CM | POA: Diagnosis not present

## 2024-03-05 DIAGNOSIS — Z947 Corneal transplant status: Secondary | ICD-10-CM | POA: Diagnosis not present

## 2024-03-05 DIAGNOSIS — H4051X0 Glaucoma secondary to other eye disorders, right eye, stage unspecified: Secondary | ICD-10-CM | POA: Diagnosis not present

## 2024-03-05 DIAGNOSIS — H2512 Age-related nuclear cataract, left eye: Secondary | ICD-10-CM | POA: Diagnosis not present

## 2024-03-17 ENCOUNTER — Other Ambulatory Visit: Payer: Self-pay

## 2024-03-17 ENCOUNTER — Inpatient Hospital Stay (HOSPITAL_COMMUNITY)
Admission: EM | Admit: 2024-03-17 | Discharge: 2024-03-23 | DRG: 853 | Disposition: A | Discharge status: Home / Self Care | Attending: Internal Medicine | Admitting: Internal Medicine

## 2024-03-17 ENCOUNTER — Emergency Department (HOSPITAL_COMMUNITY)

## 2024-03-17 DIAGNOSIS — R1011 Right upper quadrant pain: Secondary | ICD-10-CM | POA: Diagnosis not present

## 2024-03-17 DIAGNOSIS — E66811 Obesity, class 1: Secondary | ICD-10-CM | POA: Diagnosis present

## 2024-03-17 DIAGNOSIS — R739 Hyperglycemia, unspecified: Secondary | ICD-10-CM | POA: Diagnosis present

## 2024-03-17 DIAGNOSIS — R7303 Prediabetes: Secondary | ICD-10-CM | POA: Diagnosis present

## 2024-03-17 DIAGNOSIS — I4819 Other persistent atrial fibrillation: Secondary | ICD-10-CM | POA: Insufficient documentation

## 2024-03-17 DIAGNOSIS — D72829 Elevated white blood cell count, unspecified: Secondary | ICD-10-CM | POA: Diagnosis present

## 2024-03-17 DIAGNOSIS — Z833 Family history of diabetes mellitus: Secondary | ICD-10-CM

## 2024-03-17 DIAGNOSIS — I2510 Atherosclerotic heart disease of native coronary artery without angina pectoris: Secondary | ICD-10-CM | POA: Diagnosis present

## 2024-03-17 DIAGNOSIS — K760 Fatty (change of) liver, not elsewhere classified: Secondary | ICD-10-CM | POA: Diagnosis not present

## 2024-03-17 DIAGNOSIS — I4821 Permanent atrial fibrillation: Secondary | ICD-10-CM | POA: Diagnosis present

## 2024-03-17 DIAGNOSIS — Z7901 Long term (current) use of anticoagulants: Secondary | ICD-10-CM

## 2024-03-17 DIAGNOSIS — I10 Essential (primary) hypertension: Secondary | ICD-10-CM | POA: Diagnosis present

## 2024-03-17 DIAGNOSIS — K8050 Calculus of bile duct without cholangitis or cholecystitis without obstruction: Secondary | ICD-10-CM

## 2024-03-17 DIAGNOSIS — A4151 Sepsis due to Escherichia coli [E. coli]: Principal | ICD-10-CM | POA: Diagnosis present

## 2024-03-17 DIAGNOSIS — R609 Edema, unspecified: Secondary | ICD-10-CM | POA: Diagnosis not present

## 2024-03-17 DIAGNOSIS — B9620 Unspecified Escherichia coli [E. coli] as the cause of diseases classified elsewhere: Secondary | ICD-10-CM | POA: Diagnosis not present

## 2024-03-17 DIAGNOSIS — Z83438 Family history of other disorder of lipoprotein metabolism and other lipidemia: Secondary | ICD-10-CM

## 2024-03-17 DIAGNOSIS — K861 Other chronic pancreatitis: Secondary | ICD-10-CM | POA: Diagnosis not present

## 2024-03-17 DIAGNOSIS — K858 Other acute pancreatitis without necrosis or infection: Secondary | ICD-10-CM | POA: Diagnosis not present

## 2024-03-17 DIAGNOSIS — Z823 Family history of stroke: Secondary | ICD-10-CM

## 2024-03-17 DIAGNOSIS — K8010 Calculus of gallbladder with chronic cholecystitis without obstruction: Secondary | ICD-10-CM | POA: Diagnosis not present

## 2024-03-17 DIAGNOSIS — K859 Acute pancreatitis without necrosis or infection, unspecified: Secondary | ICD-10-CM | POA: Diagnosis present

## 2024-03-17 DIAGNOSIS — E8721 Acute metabolic acidosis: Secondary | ICD-10-CM | POA: Diagnosis present

## 2024-03-17 DIAGNOSIS — K851 Biliary acute pancreatitis without necrosis or infection: Secondary | ICD-10-CM | POA: Diagnosis not present

## 2024-03-17 DIAGNOSIS — K8020 Calculus of gallbladder without cholecystitis without obstruction: Secondary | ICD-10-CM | POA: Diagnosis not present

## 2024-03-17 DIAGNOSIS — R079 Chest pain, unspecified: Secondary | ICD-10-CM | POA: Diagnosis not present

## 2024-03-17 DIAGNOSIS — K828 Other specified diseases of gallbladder: Secondary | ICD-10-CM | POA: Diagnosis present

## 2024-03-17 DIAGNOSIS — K8581 Other acute pancreatitis with uninfected necrosis: Secondary | ICD-10-CM

## 2024-03-17 DIAGNOSIS — K219 Gastro-esophageal reflux disease without esophagitis: Secondary | ICD-10-CM | POA: Diagnosis not present

## 2024-03-17 DIAGNOSIS — K7689 Other specified diseases of liver: Secondary | ICD-10-CM | POA: Diagnosis not present

## 2024-03-17 DIAGNOSIS — Z6834 Body mass index (BMI) 34.0-34.9, adult: Secondary | ICD-10-CM

## 2024-03-17 DIAGNOSIS — E876 Hypokalemia: Secondary | ICD-10-CM | POA: Diagnosis present

## 2024-03-17 DIAGNOSIS — R918 Other nonspecific abnormal finding of lung field: Secondary | ICD-10-CM | POA: Diagnosis not present

## 2024-03-17 DIAGNOSIS — E871 Hypo-osmolality and hyponatremia: Secondary | ICD-10-CM | POA: Diagnosis present

## 2024-03-17 DIAGNOSIS — Z8249 Family history of ischemic heart disease and other diseases of the circulatory system: Secondary | ICD-10-CM

## 2024-03-17 DIAGNOSIS — N281 Cyst of kidney, acquired: Secondary | ICD-10-CM | POA: Diagnosis not present

## 2024-03-17 DIAGNOSIS — Z9049 Acquired absence of other specified parts of digestive tract: Secondary | ICD-10-CM | POA: Diagnosis not present

## 2024-03-17 DIAGNOSIS — Z87891 Personal history of nicotine dependence: Secondary | ICD-10-CM

## 2024-03-17 DIAGNOSIS — G4733 Obstructive sleep apnea (adult) (pediatric): Secondary | ICD-10-CM | POA: Diagnosis present

## 2024-03-17 DIAGNOSIS — Z01818 Encounter for other preprocedural examination: Secondary | ICD-10-CM | POA: Diagnosis not present

## 2024-03-17 DIAGNOSIS — R1084 Generalized abdominal pain: Secondary | ICD-10-CM | POA: Diagnosis not present

## 2024-03-17 DIAGNOSIS — Z83719 Family history of colon polyps, unspecified: Secondary | ICD-10-CM

## 2024-03-17 DIAGNOSIS — Z79899 Other long term (current) drug therapy: Secondary | ICD-10-CM

## 2024-03-17 DIAGNOSIS — R109 Unspecified abdominal pain: Secondary | ICD-10-CM | POA: Diagnosis not present

## 2024-03-17 DIAGNOSIS — R7881 Bacteremia: Secondary | ICD-10-CM | POA: Diagnosis not present

## 2024-03-17 DIAGNOSIS — R110 Nausea: Secondary | ICD-10-CM | POA: Diagnosis not present

## 2024-03-17 DIAGNOSIS — K5730 Diverticulosis of large intestine without perforation or abscess without bleeding: Secondary | ICD-10-CM | POA: Diagnosis not present

## 2024-03-17 DIAGNOSIS — R000 Tachycardia, unspecified: Secondary | ICD-10-CM | POA: Diagnosis not present

## 2024-03-17 LAB — COMPREHENSIVE METABOLIC PANEL WITH GFR
ALT: 126 U/L — ABNORMAL HIGH (ref 0–44)
AST: 247 U/L — ABNORMAL HIGH (ref 15–41)
Albumin: 3.9 g/dL (ref 3.5–5.0)
Alkaline Phosphatase: 91 U/L (ref 38–126)
Anion gap: 14 (ref 5–15)
BUN: 17 mg/dL (ref 8–23)
CO2: 17 mmol/L — ABNORMAL LOW (ref 22–32)
Calcium: 9.1 mg/dL (ref 8.9–10.3)
Chloride: 103 mmol/L (ref 98–111)
Creatinine, Ser: 1.03 mg/dL (ref 0.61–1.24)
GFR, Estimated: >60 mL/min (ref >60)
Glucose, Bld: 220 mg/dL — ABNORMAL HIGH (ref 70–99)
Potassium: 4 mmol/L (ref 3.5–5.1)
Sodium: 134 mmol/L — ABNORMAL LOW (ref 135–145)
Total Bilirubin: 2.5 mg/dL — ABNORMAL HIGH (ref 0.0–1.2)
Total Protein: 6.9 g/dL (ref 6.5–8.1)

## 2024-03-17 LAB — URINALYSIS, ROUTINE W REFLEX MICROSCOPIC
Bacteria, UA: NONE SEEN
Glucose, UA: NEGATIVE mg/dL
Hgb urine dipstick: NEGATIVE
Ketones, ur: 5 mg/dL — AB
Leukocytes,Ua: NEGATIVE
Nitrite: NEGATIVE
Protein, ur: 30 mg/dL — AB
Specific Gravity, Urine: 1.025 (ref 1.005–1.030)
pH: 5 (ref 5.0–8.0)

## 2024-03-17 LAB — CBC WITH DIFFERENTIAL/PLATELET
Abs Immature Granulocytes: 0.09 10*3/uL — ABNORMAL HIGH (ref 0.00–0.07)
Basophils Absolute: 0 10*3/uL (ref 0.0–0.1)
Basophils Relative: 0 %
Eosinophils Absolute: 0 10*3/uL (ref 0.0–0.5)
Eosinophils Relative: 0 %
HCT: 53.5 % — ABNORMAL HIGH (ref 39.0–52.0)
Hemoglobin: 18.5 g/dL — ABNORMAL HIGH (ref 13.0–17.0)
Immature Granulocytes: 1 %
Lymphocytes Relative: 5 %
Lymphs Abs: 0.9 10*3/uL (ref 0.7–4.0)
MCH: 32.7 pg (ref 26.0–34.0)
MCHC: 34.6 g/dL (ref 30.0–36.0)
MCV: 94.5 fL (ref 80.0–100.0)
Monocytes Absolute: 0.5 10*3/uL (ref 0.1–1.0)
Monocytes Relative: 3 %
Neutro Abs: 16.2 10*3/uL — ABNORMAL HIGH (ref 1.7–7.7)
Neutrophils Relative %: 91 %
Platelets: 318 10*3/uL (ref 150–400)
RBC: 5.66 MIL/uL (ref 4.22–5.81)
RDW: 12.5 % (ref 11.5–15.5)
WBC: 17.7 10*3/uL — ABNORMAL HIGH (ref 4.0–10.5)
nRBC: 0 % (ref 0.0–0.2)

## 2024-03-17 LAB — LIPASE, BLOOD: Lipase: 2089 U/L — ABNORMAL HIGH (ref 11–51)

## 2024-03-17 MED ORDER — SODIUM CHLORIDE 0.9 % IV BOLUS
500.0000 mL | Freq: Once | INTRAVENOUS | Status: AC
  Administered 2024-03-17: 500 mL via INTRAVENOUS

## 2024-03-17 MED ORDER — SODIUM CHLORIDE 0.9 % IV BOLUS
1000.0000 mL | Freq: Once | INTRAVENOUS | Status: AC
Start: 2024-03-17 — End: 2024-03-17
  Administered 2024-03-17: 1000 mL via INTRAVENOUS

## 2024-03-17 MED ORDER — HYDROMORPHONE HCL 1 MG/ML IJ SOLN
0.5000 mg | Freq: Once | INTRAMUSCULAR | Status: AC
  Administered 2024-03-17: 0.5 mg via INTRAVENOUS
  Filled 2024-03-17: qty 0.5

## 2024-03-17 MED ORDER — ONDANSETRON HCL 4 MG/2ML IJ SOLN
4.0000 mg | Freq: Once | INTRAMUSCULAR | Status: AC | PRN
  Administered 2024-03-17: 4 mg via INTRAVENOUS
  Filled 2024-03-17: qty 2

## 2024-03-17 MED ORDER — IOHEXOL 300 MG/ML  SOLN
100.0000 mL | Freq: Once | INTRAMUSCULAR | Status: AC | PRN
  Administered 2024-03-17: 100 mL via INTRAVENOUS

## 2024-03-17 NOTE — ED Notes (Signed)
 Patient transported to CT

## 2024-03-17 NOTE — ED Notes (Signed)
 Pt given urinal for urine sample. Pt unable to provide urine at this time.

## 2024-03-17 NOTE — ED Provider Notes (Signed)
 [place] EMERGENCY DEPARTMENT AT [facility] Provider Note  CSN: [id number] Arrival date & time: 03/17/24 1607  Chief Complaint(s) Abdominal Pain  HPI Sean Golden is a 78 y.o. male history of atrial fibrillation, history of pancreatitis, hypertension presenting with abdominal pain.  Patient reports abdominal pain starting this morning, associated with nausea and vomiting.  Also radiates to the back, located diffusely, worse in upper abdomen.  Feels similar to prior pancreatitis.  No fevers or chills.  No diarrhea.  No urinary symptoms.  No cough.  No other new symptoms   Past Medical History Past Medical History:  Diagnosis Date   Atrial fibrillation (HCC) 05/2014   Essential hypertension    GERD (gastroesophageal reflux disease)    Helicobacter pylori gastritis 08/2014   Treated with Pylera   Pancreatitis    Sleep apnea    Patient Active Problem List   Diagnosis Date Noted   Aortic atherosclerosis (HCC) 10/01/2022   Pancreatitis 05/17/2022    Class: History of   Impaired glucose tolerance 12/01/2021   Class 1 obesity 12/01/2021   Acute blood loss anemia 11/28/2021   Permanent atrial fibrillation (HCC) 11/28/2021   Intractable nausea and vomiting 11/26/2021   Gastric outlet obstruction 11/26/2021   Chronic pancreatitis (HCC) 11/26/2021   Hematemesis 11/16/2021   Leukocytosis 11/08/2021   Morbidly obese (HCC) 11/07/2021   Fluid overload 11/06/2021   SOB (shortness of breath)    Acute pancreatitis    Metabolic acidosis 10/30/2021   AKI (acute kidney injury) (HCC) 10/29/2021   Acute necrotizing pancreatitis 10/28/2021   Hypokalemia 10/28/2021   Transaminitis with hyperbilirubinemia 10/28/2021   Lactic acidosis 10/28/2021   Polycythemia 10/28/2021   Bandemia 10/28/2021   Obesity with sleep apnea 10/28/2021   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 09/16/2021   Impaired fasting glucose 07/23/2021   COVID-19 04/30/2021    Acute respiratory failure with hypoxia (HCC) 10/27/2019   Elevated glucose 10/27/2019   Helicobacter pylori gastritis 12/06/2014   History of colonic polyps 12/06/2014   Rectal bleeding 08/24/2014   Gastroesophageal reflux disease 08/24/2014   Aortic root enlargement (HCC) 05/27/2014   Sleep apnea 05/27/2014   FATIGUE / MALAISE 05/18/2009   Essential hypertension 05/09/2009   Home Medication(s) Prior to Admission medications   Medication Sig Start Date End Date Taking? Authorizing Provider  acetaminophen  (TYLENOL ) 325 MG tablet Take 2 tablets (650 mg total) by mouth every 6 (six) hours as needed for mild pain, fever or headache (or Fever >/= 101). 11/12/21   Johnson, Clanford L, MD  apixaban  (ELIQUIS ) 5 MG TABS tablet Take 1 tablet (5 mg total) by mouth 2 (two) times daily. 06/10/23   Miriam Norris, NP  diltiazem  (CARDIZEM  CD) 300 MG 24 hr capsule Take 1 capsule (300 mg total) by mouth daily. 11/14/23   Dunn, Dayna N, PA-C  pantoprazole  (PROTONIX ) 40 MG tablet Take 1 tablet (40 mg total) by mouth 2 (two) times daily. 12/11/22 12/15/96  Cindie Carlin POUR, DO  prednisoLONE acetate (PRED FORTE) 1 % ophthalmic suspension  07/28/23   [provider]  spironolactone  (ALDACTONE ) 25 MG tablet Take 1 tablet (25 mg total) by mouth daily. 11/04/23 02/02/24  Dunn, Dayna N, PA-C  tamsulosin  (FLOMAX ) 0.4 MG CAPS capsule Take 2 capsules (0.8 mg total) by mouth every evening. 11/12/21   Vicci Afton CROME, MD  Past Surgical History Past Surgical History:  Procedure Laterality Date   CARDIAC ELECTROPHYSIOLOGY STUDY AND ABLATION  2010?   COLONOSCOPY  2005   SOLITARY RECTAL ULCER   COLONOSCOPY N/A 09/06/2014   Dr. Harvey: three colon polyps, moderate diverticulosis throughout the entire examined colon   ESOPHAGOGASTRODUODENOSCOPY N/A 09/06/2014   Dr. Fields:moderate erosive  gastritis, no Barrett's. +H.pylori gastritis, treated with Pylera   ESOPHAGOGASTRODUODENOSCOPY (EGD) WITH PROPOFOL  N/A 11/17/2021   Procedure: ESOPHAGOGASTRODUODENOSCOPY (EGD) WITH PROPOFOL ;  Surgeon: Golda Claudis PENNER, MD;  Location: [facility];  Service: Endoscopy;  Laterality: N/A;   ESOPHAGOGASTRODUODENOSCOPY (EGD) WITH PROPOFOL  N/A 11/27/2021   Procedure: ESOPHAGOGASTRODUODENOSCOPY (EGD) WITH PROPOFOL ;  Surgeon: Eartha Angelia Sieving, MD;  Location: [facility];  Service: Gastroenterology;  Laterality: N/A;   HEMORRHOID BANDING N/A 09/06/2014   NO BANDING PERFORMED. NO INTERNAL HEMORRHOIDS IDENTIFIED    UPPER GASTROINTESTINAL ENDOSCOPY  2005 R[facility]   VASECTOMY     Family History Family History  Problem Relation Age of Onset   Hypertension Mother    Heart attack Father    Hyperlipidemia Sister    Hyperlipidemia Brother    Colon polyps Brother    Hypertension Brother    Hypertension Sister    Diabetes Brother    Stroke Other    Diabetes Other    Hypertension Other    Hyperlipidemia Other    Colon cancer Neg Hx     Social History Social History   Tobacco Use   Smoking status: Former    Current packs/day: 0.00    Average packs/day: 0.5 packs/day for 17.0 years (8.5 ttl pk-yrs)    Types: Cigarettes    Start date: 05/28/1967    Quit date: 05/27/1984    Years since quitting: 39.8    Passive exposure: Past   Smokeless tobacco: Never  Vaping Use   Vaping status: Never Used  Substance Use Topics   Alcohol use: Not Currently    Alcohol/week: 14.0 standard drinks of alcohol    Types: 14 Glasses of wine per week    Comment: hx of 1 or 2 glasses of wine at night; none currently   Drug use: No   Allergies Patient has no known allergies.  Review of Systems Review of Systems  All other systems reviewed and are negative.   Physical Exam Vital Signs  I have reviewed the triage vital signs BP 117/73   Pulse 88   Temp 97.6 F (36.4 C)   Resp 18   Ht 5' 10 (1.778 m)    Wt 109.7 kg   SpO2 94%   BMI 34.70 kg/m  Physical Exam Vitals and nursing note reviewed.  Constitutional:      General: He is not in acute distress.    Appearance: Normal appearance.  HENT:     Mouth/Throat:     Mouth: Mucous membranes are moist.   Eyes:     Conjunctiva/sclera: Conjunctivae normal.    Cardiovascular:     Rate and Rhythm: Normal rate and regular rhythm.  Pulmonary:     Effort: Pulmonary effort is normal. No respiratory distress.     Breath sounds: Normal breath sounds.  Abdominal:     General: Abdomen is flat.     Palpations: Abdomen is soft.     Tenderness: There is generalized abdominal tenderness.   Musculoskeletal:     Right lower leg: No edema.     Left lower leg: No edema.   Skin:    General: Skin is warm and dry.  Capillary Refill: Capillary refill takes less than 2 seconds.   Neurological:     Mental Status: He is alert and oriented to person, place, and time. Mental status is at baseline.   Psychiatric:        Mood and Affect: Mood normal.        Behavior: Behavior normal.     ED Results and Treatments Labs (all labs ordered are listed, but only abnormal results are displayed) Labs Reviewed  LIPASE, BLOOD - Abnormal; Notable for the following components:      Result Value   Lipase 2,089 (*)    All other components within normal limits  COMPREHENSIVE METABOLIC PANEL WITH GFR - Abnormal; Notable for the following components:   Sodium 134 (*)    CO2 17 (*)    Glucose, Bld 220 (*)    AST 247 (*)    ALT 126 (*)    Total Bilirubin 2.5 (*)    All other components within normal limits  CBC WITH DIFFERENTIAL/PLATELET - Abnormal; Notable for the following components:   WBC 17.7 (*)    Hemoglobin 18.5 (*)    HCT 53.5 (*)    Neutro Abs 16.2 (*)    Abs Immature Granulocytes 0.09 (*)    All other components within normal limits  URINALYSIS, ROUTINE W REFLEX MICROSCOPIC                                                                                                                           Radiology CT ABDOMEN PELVIS W CONTRAST Result Date: 03/17/2024 CLINICAL DATA:  Abdominal pain. EXAM: CT ABDOMEN AND PELVIS WITH CONTRAST TECHNIQUE: Multidetector CT imaging of the abdomen and pelvis was performed using the standard protocol following bolus administration of intravenous contrast. RADIATION DOSE REDUCTION: This exam was performed according to the departmental dose-optimization program which includes automated exposure control, adjustment of the mA and/or kV according to patient size and/or use of iterative reconstruction technique. CONTRAST:  OMNIPAQUE  IOHEXOL  300 MG/ML  SOLN COMPARISON:  12/23/2022. FINDINGS: Lower chest: Dependent bibasilar subsegmental atelectasis or scarring. No pleural or pericardial effusions Hepatobiliary: No hepatic parenchymal abnormalities. Mild central biliary ductal prominence. Gallbladder contains other stones or polyps. Pericholecystic fat stranding. Pancreas: Peripancreatic fat stranding consistent with acute pancreatitis. No discrete collections. No pancreatic parenchymal abnormalities. Spleen: Normal in size without focal abnormality. Adrenals/Urinary Tract: No adrenal lesions. Left kidney left subcentimeter cyst. No hydronephrosis or nephrolithiasis. Unremarkable urinary bladder. Stomach/Bowel: Stomach is within normal limits. Appendix appears normal. No evidence of bowel wall thickening, distention, or inflammatory changes. Diffuse colonic diverticulosis. Vascular/Lymphatic: Aortic atherosclerosis. No enlarged abdominal or pelvic lymph nodes. Reproductive: Prostate is unremarkable. Other: Small periumbilical abdominal wall defect containing omental fat without evidence of incarceration or herniated bowel. No abdominopelvic ascites. Musculoskeletal: Lumbosacral degenerative changes. No acute osseous abnormality. IMPRESSION: 1. Acute pancreatitis. 2. Gallbladder stones or polyps. Pericholecystic fat  stranding. 3. Diverticulosis. 4. Small periumbilical abdominal wall defect containing omental fat. 5. Aortic atherosclerosis (  ICD10-I70.0). Electronically Signed   By: Fonda Field M.D.   On: 03/17/2024 21:40    Pertinent labs & imaging results that were available during my care of the patient were reviewed by me and considered in my medical decision making (see MDM for details).  Medications Ordered in ED Medications  sodium chloride  0.9 % bolus 500 mL (has no administration in time range)  ondansetron  (ZOFRAN ) injection 4 mg (4 mg Intravenous Given 03/17/24 1646)  HYDROmorphone  (DILAUDID ) injection 0.5 mg (0.5 mg Intravenous Given 03/17/24 1951)  sodium chloride  0.9 % bolus 1,000 mL (1,000 mLs Intravenous New Bag/Given 03/17/24 1951)  iohexol  (OMNIPAQUE ) 300 MG/ML solution 100 mL (100 mLs Intravenous Contrast Given 03/17/24 2124)                                                                                                                                     Procedures Procedures  (including critical care time)  Medical Decision Making / ED Course   MDM:  78 year old presenting to the emergency department with abdominal pain.  Patient overall well-appearing, vital signs reassuring.  Suspect likely recurrent pancreatitis.  Lipase very elevated at over 2000.  Patient also has some LFT abnormalities.  Differential includes gallstone pancreatitis.  Will reassess.  Will obtain CT scan for further evaluation.  Likely discussed with GI, patient has had very complicated course previously.  Will need admission.  Does have leukocytosis, but no fevers to suggest underlying infectious process.  Clinical Course as of 03/17/24 2228  Wed Mar 17, 2024  2226 CT scan shows pancreatitis, also some stranding around gallbladder with gallstones.  Suspect gallstone pancreatitis.  Discussed with hospitalist Dr. Adefeso who will admit the patient.  Suspect he will need cholecystectomy also. [WS]     Clinical Course User Index [WS] Francesca Elsie CROME, MD     Additional history obtained: -Additional history obtained from spouse -External records from outside source obtained and reviewed including: Chart review including previous notes, labs, imaging, consultation notes including prior medical records    Lab Tests: -I ordered, reviewed, and interpreted labs.   The pertinent results include:   Labs Reviewed  LIPASE, BLOOD - Abnormal; Notable for the following components:      Result Value   Lipase 2,089 (*)    All other components within normal limits  COMPREHENSIVE METABOLIC PANEL WITH GFR - Abnormal; Notable for the following components:   Sodium 134 (*)    CO2 17 (*)    Glucose, Bld 220 (*)    AST 247 (*)    ALT 126 (*)    Total Bilirubin 2.5 (*)    All other components within normal limits  CBC WITH DIFFERENTIAL/PLATELET - Abnormal; Notable for the following components:   WBC 17.7 (*)    Hemoglobin 18.5 (*)    HCT 53.5 (*)    Neutro Abs 16.2 (*)    Abs Immature Granulocytes 0.09 (*)  All other components within normal limits  URINALYSIS, ROUTINE W REFLEX MICROSCOPIC    Notable for abnormal lfts, leukocytosis without fever, elevated lipase   Imaging Studies ordered: I ordered imaging studies including CT abdomen On my interpretation imaging demonstrates pancreatitis and gallstones  I independently visualized and interpreted imaging. I agree with the radiologist interpretation   Medicines ordered and prescription drug management: Meds ordered this encounter  Medications   ondansetron  (ZOFRAN ) injection 4 mg   HYDROmorphone  (DILAUDID ) injection 0.5 mg   sodium chloride  0.9 % bolus 1,000 mL   iohexol  (OMNIPAQUE ) 300 MG/ML solution 100 mL   sodium chloride  0.9 % bolus 500 mL    -I have reviewed the patients home medicines and have made adjustments as needed   Social Determinants of Health:  Diagnosis or treatment significantly limited by social  determinants of health: obesity   Reevaluation: After the interventions noted above, I reevaluated the patient and found that their symptoms have improved  Co morbidities that complicate the patient evaluation  Past Medical History:  Diagnosis Date   Atrial fibrillation (HCC) 05/2014   Essential hypertension    GERD (gastroesophageal reflux disease)    Helicobacter pylori gastritis 08/2014   Treated with Pylera   Pancreatitis    Sleep apnea       Dispostion: Disposition decision including need for hospitalization was considered, and patient admitted to the hospital.    Final Clinical Impression(s) / ED Diagnoses Final diagnoses:  Acute gallstone pancreatitis     This chart was dictated using voice recognition software.  Despite best efforts to proofread,  errors can occur which can change the documentation meaning.    Francesca Elsie CROME, MD 03/17/24 2228

## 2024-03-17 NOTE — ED Triage Notes (Signed)
 Pt arrived via [facility] from home c/o severe abdominal pain and emesis since this morning. Pt reports Hx of pancreatitis. [facility] established IV access PTA, but did not administer any medications.

## 2024-03-18 ENCOUNTER — Encounter (HOSPITAL_COMMUNITY): Payer: Self-pay | Admitting: Internal Medicine

## 2024-03-18 ENCOUNTER — Inpatient Hospital Stay (HOSPITAL_COMMUNITY)

## 2024-03-18 DIAGNOSIS — E871 Hypo-osmolality and hyponatremia: Secondary | ICD-10-CM | POA: Diagnosis not present

## 2024-03-18 DIAGNOSIS — Z87891 Personal history of nicotine dependence: Secondary | ICD-10-CM | POA: Diagnosis not present

## 2024-03-18 DIAGNOSIS — Z6834 Body mass index (BMI) 34.0-34.9, adult: Secondary | ICD-10-CM | POA: Diagnosis not present

## 2024-03-18 DIAGNOSIS — E8721 Acute metabolic acidosis: Secondary | ICD-10-CM | POA: Diagnosis not present

## 2024-03-18 DIAGNOSIS — I4819 Other persistent atrial fibrillation: Secondary | ICD-10-CM

## 2024-03-18 DIAGNOSIS — Z79899 Other long term (current) drug therapy: Secondary | ICD-10-CM | POA: Diagnosis not present

## 2024-03-18 DIAGNOSIS — Z743 Need for continuous supervision: Secondary | ICD-10-CM | POA: Diagnosis not present

## 2024-03-18 DIAGNOSIS — K861 Other chronic pancreatitis: Secondary | ICD-10-CM | POA: Diagnosis not present

## 2024-03-18 DIAGNOSIS — K851 Biliary acute pancreatitis without necrosis or infection: Secondary | ICD-10-CM | POA: Diagnosis not present

## 2024-03-18 DIAGNOSIS — R7881 Bacteremia: Secondary | ICD-10-CM | POA: Diagnosis not present

## 2024-03-18 DIAGNOSIS — K219 Gastro-esophageal reflux disease without esophagitis: Secondary | ICD-10-CM

## 2024-03-18 DIAGNOSIS — R7303 Prediabetes: Secondary | ICD-10-CM | POA: Diagnosis not present

## 2024-03-18 DIAGNOSIS — R739 Hyperglycemia, unspecified: Secondary | ICD-10-CM | POA: Diagnosis not present

## 2024-03-18 DIAGNOSIS — A4151 Sepsis due to Escherichia coli [E. coli]: Secondary | ICD-10-CM | POA: Diagnosis not present

## 2024-03-18 DIAGNOSIS — K8010 Calculus of gallbladder with chronic cholecystitis without obstruction: Secondary | ICD-10-CM | POA: Diagnosis not present

## 2024-03-18 DIAGNOSIS — K859 Acute pancreatitis without necrosis or infection, unspecified: Secondary | ICD-10-CM

## 2024-03-18 DIAGNOSIS — E66811 Obesity, class 1: Secondary | ICD-10-CM | POA: Diagnosis not present

## 2024-03-18 DIAGNOSIS — Z83719 Family history of colon polyps, unspecified: Secondary | ICD-10-CM | POA: Diagnosis not present

## 2024-03-18 DIAGNOSIS — Z833 Family history of diabetes mellitus: Secondary | ICD-10-CM | POA: Diagnosis not present

## 2024-03-18 DIAGNOSIS — Z823 Family history of stroke: Secondary | ICD-10-CM | POA: Diagnosis not present

## 2024-03-18 DIAGNOSIS — Z7901 Long term (current) use of anticoagulants: Secondary | ICD-10-CM | POA: Diagnosis not present

## 2024-03-18 DIAGNOSIS — R1084 Generalized abdominal pain: Secondary | ICD-10-CM | POA: Diagnosis not present

## 2024-03-18 DIAGNOSIS — G4733 Obstructive sleep apnea (adult) (pediatric): Secondary | ICD-10-CM | POA: Diagnosis not present

## 2024-03-18 DIAGNOSIS — I2510 Atherosclerotic heart disease of native coronary artery without angina pectoris: Secondary | ICD-10-CM | POA: Diagnosis not present

## 2024-03-18 DIAGNOSIS — I10 Essential (primary) hypertension: Secondary | ICD-10-CM | POA: Diagnosis not present

## 2024-03-18 DIAGNOSIS — I4821 Permanent atrial fibrillation: Secondary | ICD-10-CM | POA: Diagnosis not present

## 2024-03-18 DIAGNOSIS — Z83438 Family history of other disorder of lipoprotein metabolism and other lipidemia: Secondary | ICD-10-CM | POA: Diagnosis not present

## 2024-03-18 DIAGNOSIS — Z8249 Family history of ischemic heart disease and other diseases of the circulatory system: Secondary | ICD-10-CM | POA: Diagnosis not present

## 2024-03-18 DIAGNOSIS — B9620 Unspecified Escherichia coli [E. coli] as the cause of diseases classified elsewhere: Secondary | ICD-10-CM | POA: Diagnosis not present

## 2024-03-18 DIAGNOSIS — K8050 Calculus of bile duct without cholangitis or cholecystitis without obstruction: Secondary | ICD-10-CM | POA: Diagnosis not present

## 2024-03-18 DIAGNOSIS — D72829 Elevated white blood cell count, unspecified: Secondary | ICD-10-CM | POA: Diagnosis not present

## 2024-03-18 DIAGNOSIS — E876 Hypokalemia: Secondary | ICD-10-CM | POA: Diagnosis not present

## 2024-03-18 DIAGNOSIS — Z01818 Encounter for other preprocedural examination: Secondary | ICD-10-CM | POA: Diagnosis not present

## 2024-03-18 LAB — CBC
HCT: 49.7 % (ref 39.0–52.0)
Hemoglobin: 16.7 g/dL (ref 13.0–17.0)
MCH: 31.9 pg (ref 26.0–34.0)
MCHC: 33.6 g/dL (ref 30.0–36.0)
MCV: 95 fL (ref 80.0–100.0)
Platelets: 280 10*3/uL (ref 150–400)
RBC: 5.23 MIL/uL (ref 4.22–5.81)
RDW: 12.9 % (ref 11.5–15.5)
WBC: 24.4 10*3/uL — ABNORMAL HIGH (ref 4.0–10.5)
nRBC: 0 % (ref 0.0–0.2)

## 2024-03-18 LAB — GLUCOSE, CAPILLARY
Glucose-Capillary: 115 mg/dL — ABNORMAL HIGH (ref 70–99)
Glucose-Capillary: 142 mg/dL — ABNORMAL HIGH (ref 70–99)

## 2024-03-18 LAB — LIPID PANEL
Cholesterol: 148 mg/dL (ref 0–200)
HDL: 39 mg/dL — ABNORMAL LOW (ref >40)
LDL Cholesterol: 97 mg/dL (ref 0–99)
Total CHOL/HDL Ratio: 3.8 ratio
Triglycerides: 61 mg/dL (ref <150)
VLDL: 12 mg/dL (ref 0–40)

## 2024-03-18 LAB — COMPREHENSIVE METABOLIC PANEL WITH GFR
ALT: 295 U/L — ABNORMAL HIGH (ref 0–44)
AST: 290 U/L — ABNORMAL HIGH (ref 15–41)
Albumin: 3.5 g/dL (ref 3.5–5.0)
Alkaline Phosphatase: 112 U/L (ref 38–126)
Anion gap: 14 (ref 5–15)
BUN: 18 mg/dL (ref 8–23)
CO2: 15 mmol/L — ABNORMAL LOW (ref 22–32)
Calcium: 8.6 mg/dL — ABNORMAL LOW (ref 8.9–10.3)
Chloride: 106 mmol/L (ref 98–111)
Creatinine, Ser: 0.89 mg/dL (ref 0.61–1.24)
GFR, Estimated: >60 mL/min (ref >60)
Glucose, Bld: 153 mg/dL — ABNORMAL HIGH (ref 70–99)
Potassium: 4.4 mmol/L (ref 3.5–5.1)
Sodium: 135 mmol/L (ref 135–145)
Total Bilirubin: 7.5 mg/dL — ABNORMAL HIGH (ref 0.0–1.2)
Total Protein: 6.7 g/dL (ref 6.5–8.1)

## 2024-03-18 LAB — HEMOGLOBIN A1C
Hgb A1c MFr Bld: 5.6 % (ref 4.8–5.6)
Mean Plasma Glucose: 114.02 mg/dL

## 2024-03-18 LAB — PROCALCITONIN: Procalcitonin: 37.18 ng/mL

## 2024-03-18 LAB — CBG MONITORING, ED
Glucose-Capillary: 152 mg/dL — ABNORMAL HIGH (ref 70–99)
Glucose-Capillary: 128 mg/dL — ABNORMAL HIGH (ref 70–99)

## 2024-03-18 MED ORDER — APIXABAN 5 MG PO TABS: 5.0000 mg | ORAL_TABLET | Freq: Two times a day (BID) | ORAL | Status: DC

## 2024-03-18 MED ORDER — LACTATED RINGERS IV SOLN: INTRAVENOUS | Status: DC

## 2024-03-18 MED ORDER — INSULIN ASPART 100 UNIT/ML IJ SOLN
0.0000 [IU] | Freq: Three times a day (TID) | INTRAMUSCULAR | Status: DC
  Administered 2024-03-18 – 2024-03-21 (×5): 2 [IU] via SUBCUTANEOUS
  Administered 2024-03-21 – 2024-03-22 (×2): 3 [IU] via SUBCUTANEOUS
  Administered 2024-03-22 – 2024-03-23 (×2): 2 [IU] via SUBCUTANEOUS
  Filled 2024-03-18: qty 1

## 2024-03-18 MED ORDER — PANTOPRAZOLE SODIUM 40 MG PO TBEC
40.0000 mg | DELAYED_RELEASE_TABLET | Freq: Two times a day (BID) | ORAL | Status: DC
  Administered 2024-03-18 – 2024-03-19 (×3): 40 mg via ORAL
  Filled 2024-03-18 (×3): qty 1

## 2024-03-18 MED ORDER — DILTIAZEM HCL ER COATED BEADS 180 MG PO CP24
300.0000 mg | ORAL_CAPSULE | Freq: Every day | ORAL | Status: DC
  Administered 2024-03-18 – 2024-03-23 (×6): 300 mg via ORAL
  Filled 2024-03-18 (×6): qty 1

## 2024-03-18 MED ORDER — HYDROMORPHONE HCL 1 MG/ML IJ SOLN
0.5000 mg | INTRAMUSCULAR | Status: DC | PRN
  Administered 2024-03-18 (×2): 0.5 mg via INTRAVENOUS
  Filled 2024-03-18 (×3): qty 0.5

## 2024-03-18 MED ORDER — PIPERACILLIN-TAZOBACTAM 3.375 G IVPB
3.3750 g | Freq: Three times a day (TID) | INTRAVENOUS | Status: DC
  Administered 2024-03-18 – 2024-03-19 (×3): 3.375 g via INTRAVENOUS
  Filled 2024-03-18 (×3): qty 50

## 2024-03-18 MED ORDER — SODIUM CHLORIDE 0.9% FLUSH
10.0000 mL | Freq: Two times a day (BID) | INTRAVENOUS | Status: DC
  Administered 2024-03-18 – 2024-03-21 (×6): 10 mL

## 2024-03-18 MED ORDER — GADOBUTROL 1 MMOL/ML IV SOLN
10.0000 mL | Freq: Once | INTRAVENOUS | Status: AC | PRN
  Administered 2024-03-18: 10 mL via INTRAVENOUS

## 2024-03-18 MED ORDER — CHLORHEXIDINE GLUCONATE CLOTH 2 % EX PADS
6.0000 | MEDICATED_PAD | Freq: Every day | CUTANEOUS | Status: DC
  Administered 2024-03-18 – 2024-03-23 (×6): 6 via TOPICAL

## 2024-03-18 MED ORDER — PANTOPRAZOLE SODIUM 40 MG IV SOLR: 40.0000 mg | INTRAVENOUS | Status: DC

## 2024-03-18 NOTE — Progress Notes (Signed)
 Spoke with Dr. This morning.  He requested my assistance with getting transferred to [facility] or [facility] given MRCP with evidence of choledocholithiasis which is likely etiology of pancreatitis.  I spoke with [place] PA Harlene Mail and she reached out to Dr. Charlanne who reviewed patient's imaging and stated that given the elevated LFTs and lipase and elevation in bilirubin that he would accept patient for transfer for possible ERCP.  He recommended starting empiric antibiotics given patient's age and possible progression to cholangitis given filling defect of the CBD.  Relayed these findings to Dr. Evonnie who initiated transfer.  Charmaine Melia, MSN, APRN, FNP-BC, AGACNP-BC [facility] at [facility]

## 2024-03-18 NOTE — Progress Notes (Addendum)
 MRCP results reviewed There is a probable single 4 x 7 mm choledocholithiasis in the midportion of common bile duct. Small volume cholelithiasis without acute cholecystitis. No pancreatic necrosis  I discussed with [facility] and they spoke with [place], Dr. Kathyanne transfer to [facility] for consult and ERCP --zosyn IV started   Alm Claudina Oliphant, DO

## 2024-03-18 NOTE — Progress Notes (Addendum)
 PROGRESS NOTE  Sean Golden FMW:[id number] DOB: 06-Apr-1946 DOA: 03/17/2024 PCP: [name], MD  Brief History:  78 year old male with history of necrotizing pancreatitis 10/2021, permanent atrial fibrillation, hypertension, OSA, GERD, coronary artery disease, iron deficiency anemia, bronchiectasis presenting with upper abdominal pain that began on the morning of 03/17/2024. He states that he had an episode of upper abdominal pain about 1-1/2 weeks ago lasting about 3 hours that spontaneously resolved.  He denies any new medications.  He is not drinking any alcohol.  He has had nausea and dry heaving.  He denies any fevers, chills, headache chest pain, shortness breath, hemoptysis, diarrhea, dysuria, hematuria, hematochezia, melena.   He is a smoker in the past having quit in the 1980s. Total smoking load is not significant. He had an episode of severe pancreatitis approximately 2 years ago in February 2023 from which he has recovered.  He quit smoking 39 years ago with approximately 8.5 pack year history  In the ED, the patient was afebrile hemodynamically stable with oxygen saturation 95% room air.  WBC 17.7, hemoglobin 18.5, platelets 318.  Sodium 134, potassium 4.0, bicarbonate 17, serum creatinine 1.03.  Lipase 2089.  AST 247, ALT 126, alkaline phosphatase 91, total bilirubin 2.5.  UA was negative for pyuria.  CT abdomen and pelvis showed peripancreatic fat stranding without any pancreatic fluid collections.  There was cholelithiasis with pericholecystic fat stranding, and common bile duct.  The patient was started on IV fluids.  GI was consulted to assist with management.   Assessment/Plan: Acute biliary pancreatitis - 03/17/2024 CT [facility] as discussed above - Right upper quadrant ultrasound - Obtain MRCP - GI consult - Continue IV fluids - Judicious opioids  Hyperglycemia - Patient has a history of impaired glucose tolerance -11/30/2021 hemoglobin A1c 6.2 -Repeat hemoglobin  A1c -NovoLog  sliding scale  Permanent atrial fibrillation -Rate controlled presently -Holding apixaban  temporarily in preparation for invasive procedures -Last dose apixaban  03/17/24 AM  Essential hypertension -Holding diltiazem  and spironolactone  secondary to soft blood pressure  GERD -Continue pantoprazole   OSA -Continue CPAP nightly  Obesity -BMI 34.70 -Lifestyle mods patient       Family Communication:   spouse at bedside 6/26  Consultants:  GI  Code Status:  FULL   DVT Prophylaxis:  SCDs   Procedures: As Listed in Progress Note Above  Antibiotics: None   Total time spent 50 minutes.  Greater than 50% spent face to face counseling and coordinating care.      Subjective: Patient complains of upper pain.  He has some nausea without emesis.  He denies any chest pain, shortness of breath, fevers, chills headache, neck pain.  Objective: Vitals:   03/18/24 0200 03/18/24 0215 03/18/24 0300 03/18/24 0440  BP: 109/71 104/71 111/70   Pulse: 85 85 93   Resp:  18 18   Temp:    98.2 F (36.8 C)  TempSrc:    Oral  SpO2: 92% 93% 93%   Weight:      Height:        Intake/Output Summary (Last 24 hours) at 03/18/2024 0720 Last data filed at 03/18/2024 0030 Gross per 24 hour  Intake 1500 ml  Output --  Net 1500 ml   Weight change:  Exam:  General:  Pt is alert, follows commands appropriately, not in acute distress HEENT: No icterus, No thrush, No neck mass, [place]/AT Cardiovascular: RRR, S1/S2, no rubs, no gallops Respiratory: CTA bilaterally, no wheezing, no crackles,  no rhonchi Abdomen: Soft/+BS, upper abd tender, non distended, no guarding Extremities: No edema, No lymphangitis, No petechiae, No rashes, no synovitis   Data Reviewed: I have personally reviewed following labs and imaging studies Basic Metabolic Panel: Recent Labs  Lab 03/17/24 1704  NA 134*  K 4.0  CL 103  CO2 17*  GLUCOSE 220*  BUN 17  CREATININE 1.03  CALCIUM  9.1   Liver  Function Tests: Recent Labs  Lab 03/17/24 1704  AST 247*  ALT 126*  ALKPHOS 91  BILITOT 2.5*  PROT 6.9  ALBUMIN  3.9   Recent Labs  Lab 03/17/24 1704  LIPASE 2,089*   No results for input(s): AMMONIA in the last 168 hours. Coagulation Profile: No results for input(s): INR, PROTIME in the last 168 hours. CBC: Recent Labs  Lab 03/17/24 1704  WBC 17.7*  NEUTROABS 16.2*  HGB 18.5*  HCT 53.5*  MCV 94.5  PLT 318   Cardiac Enzymes: No results for input(s): CKTOTAL, CKMB, CKMBINDEX, TROPONINI in the last 168 hours. BNP: Invalid input(s): POCBNP CBG: No results for input(s): GLUCAP in the last 168 hours. HbA1C: No results for input(s): HGBA1C in the last 72 hours. Urine analysis:    Component Value Date/Time   COLORURINE AMBER (A) 03/17/2024 2139   APPEARANCEUR CLEAR 03/17/2024 2139   LABSPEC 1.025 03/17/2024 2139   PHURINE 5.0 03/17/2024 2139   GLUCOSEU NEGATIVE 03/17/2024 2139   HGBUR NEGATIVE 03/17/2024 2139   BILIRUBINUR SMALL (A) 03/17/2024 2139   KETONESUR 5 (A) 03/17/2024 2139   PROTEINUR 30 (A) 03/17/2024 2139   NITRITE NEGATIVE 03/17/2024 2139   LEUKOCYTESUR NEGATIVE 03/17/2024 2139   Sepsis Labs: @LABRCNTIP (procalcitonin:4,lacticidven:4) )No results found for this or any previous visit (from the past 240 hours).   Scheduled Meds:  diltiazem   300 mg Oral Daily   insulin  aspart  0-15 Units Subcutaneous TID WC   pantoprazole   40 mg Oral BID   Continuous Infusions:  lactated ringers       Procedures/Studies: CT ABDOMEN PELVIS W CONTRAST Result Date: 03/17/2024 CLINICAL DATA:  Abdominal pain. EXAM: CT ABDOMEN AND PELVIS WITH CONTRAST TECHNIQUE: Multidetector CT imaging of the abdomen and pelvis was performed using the standard protocol following bolus administration of intravenous contrast. RADIATION DOSE REDUCTION: This exam was performed according to the departmental dose-optimization program which includes automated exposure  control, adjustment of the mA and/or kV according to patient size and/or use of iterative reconstruction technique. CONTRAST:  OMNIPAQUE  IOHEXOL  300 MG/ML  SOLN COMPARISON:  12/23/2022. FINDINGS: Lower chest: Dependent bibasilar subsegmental atelectasis or scarring. No pleural or pericardial effusions Hepatobiliary: No hepatic parenchymal abnormalities. Mild central biliary ductal prominence. Gallbladder contains other stones or polyps. Pericholecystic fat stranding. Pancreas: Peripancreatic fat stranding consistent with acute pancreatitis. No discrete collections. No pancreatic parenchymal abnormalities. Spleen: Normal in size without focal abnormality. Adrenals/Urinary Tract: No adrenal lesions. Left kidney left subcentimeter cyst. No hydronephrosis or nephrolithiasis. Unremarkable urinary bladder. Stomach/Bowel: Stomach is within normal limits. Appendix appears normal. No evidence of bowel wall thickening, distention, or inflammatory changes. Diffuse colonic diverticulosis. Vascular/Lymphatic: Aortic atherosclerosis. No enlarged abdominal or pelvic lymph nodes. Reproductive: Prostate is unremarkable. Other: Small periumbilical abdominal wall defect containing omental fat without evidence of incarceration or herniated bowel. No abdominopelvic ascites. Musculoskeletal: Lumbosacral degenerative changes. No acute osseous abnormality. IMPRESSION: 1. Acute pancreatitis. 2. Gallbladder stones or polyps. Pericholecystic fat stranding. 3. Diverticulosis. 4. Small periumbilical abdominal wall defect containing omental fat. 5. Aortic atherosclerosis (ICD10-I70.0). Electronically Signed   By: Fonda Shelvia HERO.D.  On: 03/17/2024 21:40    Alm Schneider, DO  [facility]  If 7PM-7AM, please contact night-coverage [email or web address] Password [email or web address] 03/18/2024, 7:20 AM   LOS: 0 days

## 2024-03-18 NOTE — Progress Notes (Signed)

## 2024-03-18 NOTE — H&P (Signed)
 History and Physical    Patient: Sean Golden FMW:[id number] DOB: 11-02-1945 DOA: 03/17/2024 DOS: the patient was seen and examined on 03/18/2024 PCP: [name], MD  Patient coming from: Home  Chief Complaint:  Chief Complaint  Patient presents with   Abdominal Pain   HPI: Sean Golden is a 78 y.o. male with medical history significant of chronic pancreatitis, persistent atrial fibrillation, essential hypertension, GERD, sleep apnea, obesity who presents to the emergency department due to abdominal pain which started this morning.  Pain was in upper abdomen with radiation to the back and it was rated as 8/10 on pain scale, it was persistent with no known alleviating/aggravating factor and was associated with dry heaving.  Patient denies fever, chills, chest pain or cough.  ED Course:  In the emergency department, he was hemodynamically stable, though BP was soft at 105/70.  Workup in the ED showed WBC 17.7, hemoglobin 18.5, hematocrit 33.5, MCV 94.5, platelets 318.  BMP was normal except for sodium of 134, bicarb 17, blood glucose 220.  AST 247, ALT 126, total bilirubin 2.5, lipase 2089, urinalysis was normal. CT abdomen and pelvis with contrast showed acute pancreatitis.  Gallbladder stones or polyps.  Pericholecystic fat stranding.  Small periumbilical abdominal wall defect containing omental fat. He was treated with IV NS 1.5 L, IV Dilaudid  was given.  TR[facility] was asked to admit patient  Review of Systems: Review of systems as noted in the HPI. All other systems reviewed and are negative.   Past Medical History:  Diagnosis Date   Atrial fibrillation (HCC) 05/2014   Essential hypertension    GERD (gastroesophageal reflux disease)    Helicobacter pylori gastritis 08/2014   Treated with Pylera   Pancreatitis    Sleep apnea    Past Surgical History:  Procedure Laterality Date   CARDIAC ELECTROPHYSIOLOGY STUDY AND ABLATION  2010?   COLONOSCOPY  2005   SOLITARY RECTAL ULCER    COLONOSCOPY N/A 09/06/2014   Dr. Harvey: three colon polyps, moderate diverticulosis throughout the entire examined colon   ESOPHAGOGASTRODUODENOSCOPY N/A 09/06/2014   Dr. Fields:moderate erosive gastritis, no Barrett's. +H.pylori gastritis, treated with Pylera   ESOPHAGOGASTRODUODENOSCOPY (EGD) WITH PROPOFOL  N/A 11/17/2021   Procedure: ESOPHAGOGASTRODUODENOSCOPY (EGD) WITH PROPOFOL ;  Surgeon: Golda Claudis PENNER, MD;  Location: [facility];  Service: Endoscopy;  Laterality: N/A;   ESOPHAGOGASTRODUODENOSCOPY (EGD) WITH PROPOFOL  N/A 11/27/2021   Procedure: ESOPHAGOGASTRODUODENOSCOPY (EGD) WITH PROPOFOL ;  Surgeon: Eartha Angelia Sieving, MD;  Location: [facility];  Service: Gastroenterology;  Laterality: N/A;   HEMORRHOID BANDING N/A 09/06/2014   NO BANDING PERFORMED. NO INTERNAL HEMORRHOIDS IDENTIFIED    UPPER GASTROINTESTINAL ENDOSCOPY  2005 [facility]   VASECTOMY      Social History:  reports that he quit smoking about 39 years ago. His smoking use included cigarettes. He started smoking about 56 years ago. He has a 8.5 pack-year smoking history. He has been exposed to tobacco smoke. He has never used smokeless tobacco. He reports that he does not currently use alcohol after a past usage of about 14.0 standard drinks of alcohol per week. He reports that he does not use drugs.   No Known Allergies  Family History  Problem Relation Age of Onset   Hypertension Mother    Heart attack Father    Hyperlipidemia Sister    Hyperlipidemia Brother    Colon polyps Brother    Hypertension Brother    Hypertension Sister    Diabetes Brother    Stroke Other  Diabetes Other    Hypertension Other    Hyperlipidemia Other    Colon cancer Neg Hx      Prior to Admission medications   Medication Sig Start Date End Date Taking? Authorizing Provider  acetaminophen  (TYLENOL ) 325 MG tablet Take 2 tablets (650 mg total) by mouth every 6 (six) hours as needed for mild pain, fever or headache (or Fever >/=  101). 11/12/21   Johnson, Clanford L, MD  apixaban  (ELIQUIS ) 5 MG TABS tablet Take 1 tablet (5 mg total) by mouth 2 (two) times daily. 06/10/23   Miriam Norris, NP  diltiazem  (CARDIZEM  CD) 300 MG 24 hr capsule Take 1 capsule (300 mg total) by mouth daily. 11/14/23   Dunn, Dayna N, PA-C  pantoprazole  (PROTONIX ) 40 MG tablet Take 1 tablet (40 mg total) by mouth 2 (two) times daily. 12/11/22 12/15/96  Cindie Carlin POUR, DO  prednisoLONE acetate (PRED FORTE) 1 % ophthalmic suspension  07/28/23   [provider]  spironolactone  (ALDACTONE ) 25 MG tablet Take 1 tablet (25 mg total) by mouth daily. 11/04/23 02/02/24  Dunn, Dayna N, PA-C  tamsulosin  (FLOMAX ) 0.4 MG CAPS capsule Take 2 capsules (0.8 mg total) by mouth every evening. 11/12/21   Vicci Afton CROME, MD    Physical Exam: BP 98/67   Pulse 87   Temp 97.6 F (36.4 C)   Resp 18   Ht 5' 10 (1.778 m)   Wt 109.7 kg   SpO2 92%   BMI 34.70 kg/m   General: 78 y.o. year-old male well developed well nourished in no acute distress.  Alert and oriented x3. HEENT: NCAT, EOMI, dry mucous membranes Neck: Supple, trachea medial Cardiovascular: Regular rate and rhythm with no rubs or gallops.  No thyromegaly or JVD noted.  No lower extremity edema. 2/4 pulses in all 4 extremities. Respiratory: Clear to auscultation with no wheezes or rales. Good inspiratory effort. Abdomen: Soft, tender to palpation of abdomen.   Muskuloskeletal: No cyanosis, clubbing or edema noted bilaterally Neuro: CN II-XII intact, strength 5/5 x 4, sensation, reflexes intact Skin: No ulcerative lesions noted or rashes Psychiatry: Judgement and insight appear normal. Mood is appropriate for condition and setting          Labs on Admission:  Basic Metabolic Panel: Recent Labs  Lab 03/17/24 1704  NA 134*  K 4.0  CL 103  CO2 17*  GLUCOSE 220*  BUN 17  CREATININE 1.03  CALCIUM  9.1   Liver Function Tests: Recent Labs  Lab 03/17/24 1704  AST 247*  ALT 126*   ALKPHOS 91  BILITOT 2.5*  PROT 6.9  ALBUMIN  3.9   Recent Labs  Lab 03/17/24 1704  LIPASE 2,089*   No results for input(s): AMMONIA in the last 168 hours. CBC: Recent Labs  Lab 03/17/24 1704  WBC 17.7*  NEUTROABS 16.2*  HGB 18.5*  HCT 53.5*  MCV 94.5  PLT 318   Cardiac Enzymes: No results for input(s): CKTOTAL, CKMB, CKMBINDEX, TROPONINI in the last 168 hours.  BNP (last 3 results) No results for input(s): BNP in the last 8760 hours.  ProBNP (last 3 results) Recent Labs    10/16/23 1049  PROBNP 150.0*    CBG: No results for input(s): GLUCAP in the last 168 hours.  Radiological Exams on Admission: CT ABDOMEN PELVIS W CONTRAST Result Date: 03/17/2024 CLINICAL DATA:  Abdominal pain. EXAM: CT ABDOMEN AND PELVIS WITH CONTRAST TECHNIQUE: Multidetector CT imaging of the abdomen and pelvis was performed using the standard protocol following  bolus administration of intravenous contrast. RADIATION DOSE REDUCTION: This exam was performed according to the departmental dose-optimization program which includes automated exposure control, adjustment of the mA and/or kV according to patient size and/or use of iterative reconstruction technique. CONTRAST:  OMNIPAQUE  IOHEXOL  300 MG/ML  SOLN COMPARISON:  12/23/2022. FINDINGS: Lower chest: Dependent bibasilar subsegmental atelectasis or scarring. No pleural or pericardial effusions Hepatobiliary: No hepatic parenchymal abnormalities. Mild central biliary ductal prominence. Gallbladder contains other stones or polyps. Pericholecystic fat stranding. Pancreas: Peripancreatic fat stranding consistent with acute pancreatitis. No discrete collections. No pancreatic parenchymal abnormalities. Spleen: Normal in size without focal abnormality. Adrenals/Urinary Tract: No adrenal lesions. Left kidney left subcentimeter cyst. No hydronephrosis or nephrolithiasis. Unremarkable urinary bladder. Stomach/Bowel: Stomach is within normal  limits. Appendix appears normal. No evidence of bowel wall thickening, distention, or inflammatory changes. Diffuse colonic diverticulosis. Vascular/Lymphatic: Aortic atherosclerosis. No enlarged abdominal or pelvic lymph nodes. Reproductive: Prostate is unremarkable. Other: Small periumbilical abdominal wall defect containing omental fat without evidence of incarceration or herniated bowel. No abdominopelvic ascites. Musculoskeletal: Lumbosacral degenerative changes. No acute osseous abnormality. IMPRESSION: 1. Acute pancreatitis. 2. Gallbladder stones or polyps. Pericholecystic fat stranding. 3. Diverticulosis. 4. Small periumbilical abdominal wall defect containing omental fat. 5. Aortic atherosclerosis (ICD10-I70.0). Electronically Signed   By: Fonda Field M.D.   On: 03/17/2024 21:40    EKG: I independently viewed the EKG done and my findings are as followed: EKG was not done in the ED  Assessment/Plan Present on Admission:  Acute on chronic pancreatitis (HCC)  Leukocytosis  Obesity, Class I, BMI 30-34.9  GERD (gastroesophageal reflux disease)  Principal Problem:   Acute on chronic pancreatitis (HCC) Active Problems:   GERD (gastroesophageal reflux disease)   Leukocytosis   Obesity, Class I, BMI 30-34.9   Hyperglycemia   Persistent atrial fibrillation (HCC)  Acute on chronic pancreatitis Continue IV Zofran  p.r.n Continue IV Dilaudid  p.r.n for pain Continue Protonix  Continue IV LR at 125ml/Hr Continue full liquid diet with plan to advance diet as tolerated RUQ U/S in the morning to investigate biliary etiology (gallstone and bile duct dilatation Consider surgical consult based on ultrasound findings  Leukocytosis Elevated hemoglobin/hematocrit This is possibly due to hemoconcentration Continue to monitor CBC with morning labs  Prediabetes with hyperglycemia CBG 220 Last hemoglobin A1c on record was in March 2023 and it was 6.2 Continue ISS and hypoglycemia  protocol Hemoglobin A1c will be checked  Class I obesity (BMI 34.70) Diet and lifestyle modification  Persistent atrial fibrillation Continue Cardizem , Eliquis   GERD Continue Protonix    DVT prophylaxis: Eliquis   Code Status: Full code  Family Communication: Wife at bedside (all questions answered to satisfaction)  Consults: None  Severity of Illness: The appropriate patient status for this patient is INPATIENT. Inpatient status is judged to be reasonable and necessary in order to provide the required intensity of service to ensure the patient's safety. The patient's presenting symptoms, physical exam findings, and initial radiographic and laboratory data in the context of their chronic comorbidities is felt to place them at high risk for further clinical deterioration. Furthermore, it is not anticipated that the patient will be medically stable for discharge from the hospital within 2 midnights of admission.   * I certify that at the point of admission it is my clinical judgment that the patient will require inpatient hospital care spanning beyond 2 midnights from the point of admission due to high intensity of service, high risk for further deterioration and high frequency of surveillance required.*  Author:  Posey Maier, DO 03/18/2024 2:22 AM  For on call review [email or web address].

## 2024-03-18 NOTE — Hospital Course (Addendum)
 78 year old male with history of necrotizing pancreatitis 10/2021, permanent atrial fibrillation, hypertension, OSA, GERD, coronary artery disease, iron deficiency anemia, bronchiectasis presenting with upper abdominal pain that began on the morning of 03/17/2024. He states that he had an episode of upper abdominal pain about 1-1/2 weeks ago lasting about 3 hours that spontaneously resolved.  He denies any new medications.  He is not drinking any alcohol.  He has had nausea and dry heaving.  He denies any fevers, chills, headache chest pain, shortness breath, hemoptysis, diarrhea, dysuria, hematuria, hematochezia, melena.   He is a smoker in the past having quit in the 1980s. Total smoking load is not significant. He had an episode of severe pancreatitis approximately 2 years ago in February 2023 from which he has recovered.  He quit smoking 39 years ago with approximately 8.5 pack year history  In the ED, the patient was afebrile hemodynamically stable with oxygen saturation 95% room air.  WBC 17.7, hemoglobin 18.5, platelets 318.  Sodium 134, potassium 4.0, bicarbonate 17, serum creatinine 1.03.  Lipase 2089.  AST 247, ALT 126, alkaline phosphatase 91, total bilirubin 2.5.  UA was negative for pyuria.  CT abdomen and pelvis showed peripancreatic fat stranding without any pancreatic fluid collections.  There was cholelithiasis with pericholecystic fat stranding, and common bile duct.  The patient was started on IV fluids.  GI was consulted to assist with management.

## 2024-03-19 DIAGNOSIS — Z01818 Encounter for other preprocedural examination: Secondary | ICD-10-CM

## 2024-03-19 DIAGNOSIS — B9620 Unspecified Escherichia coli [E. coli] as the cause of diseases classified elsewhere: Secondary | ICD-10-CM

## 2024-03-19 DIAGNOSIS — K859 Acute pancreatitis without necrosis or infection, unspecified: Secondary | ICD-10-CM | POA: Diagnosis not present

## 2024-03-19 DIAGNOSIS — R7881 Bacteremia: Secondary | ICD-10-CM

## 2024-03-19 DIAGNOSIS — K861 Other chronic pancreatitis: Secondary | ICD-10-CM | POA: Diagnosis not present

## 2024-03-19 DIAGNOSIS — I4821 Permanent atrial fibrillation: Secondary | ICD-10-CM

## 2024-03-19 DIAGNOSIS — I10 Essential (primary) hypertension: Secondary | ICD-10-CM

## 2024-03-19 DIAGNOSIS — K8050 Calculus of bile duct without cholangitis or cholecystitis without obstruction: Secondary | ICD-10-CM

## 2024-03-19 LAB — BLOOD CULTURE ID PANEL (REFLEXED) - BCID2

## 2024-03-19 LAB — CBC WITH DIFFERENTIAL/PLATELET
Abs Immature Granulocytes: 0.13 10*3/uL — ABNORMAL HIGH (ref 0.00–0.07)
Basophils Absolute: 0 10*3/uL (ref 0.0–0.1)
Basophils Relative: 0 %
Eosinophils Absolute: 0.1 10*3/uL (ref 0.0–0.5)
Eosinophils Relative: 0 %
HCT: 42.8 % (ref 39.0–52.0)
Hemoglobin: 14.3 g/dL (ref 13.0–17.0)
Immature Granulocytes: 1 %
Lymphocytes Relative: 5 %
Lymphs Abs: 0.9 10*3/uL (ref 0.7–4.0)
MCH: 32 pg (ref 26.0–34.0)
MCHC: 33.4 g/dL (ref 30.0–36.0)
MCV: 95.7 fL (ref 80.0–100.0)
Monocytes Absolute: 1.1 10*3/uL — ABNORMAL HIGH (ref 0.1–1.0)
Monocytes Relative: 7 %
Neutro Abs: 14.3 10*3/uL — ABNORMAL HIGH (ref 1.7–7.7)
Neutrophils Relative %: 87 %
Platelets: 223 10*3/uL (ref 150–400)
RBC: 4.47 MIL/uL (ref 4.22–5.81)
RDW: 13.2 % (ref 11.5–15.5)
WBC: 16.4 10*3/uL — ABNORMAL HIGH (ref 4.0–10.5)
nRBC: 0 % (ref 0.0–0.2)

## 2024-03-19 LAB — CBC
HCT: 41.5 % (ref 39.0–52.0)
Hemoglobin: 14.2 g/dL (ref 13.0–17.0)
MCH: 32.6 pg (ref 26.0–34.0)
MCHC: 34.2 g/dL (ref 30.0–36.0)
MCV: 95.4 fL (ref 80.0–100.0)
Platelets: 199 10*3/uL (ref 150–400)
RBC: 4.35 MIL/uL (ref 4.22–5.81)
RDW: 13 % (ref 11.5–15.5)
WBC: 14.6 10*3/uL — ABNORMAL HIGH (ref 4.0–10.5)
nRBC: 0 % (ref 0.0–0.2)

## 2024-03-19 LAB — COMPREHENSIVE METABOLIC PANEL WITH GFR
ALT: 177 U/L — ABNORMAL HIGH (ref 0–44)
ALT: 117 U/L — ABNORMAL HIGH (ref 0–44)
AST: 101 U/L — ABNORMAL HIGH (ref 15–41)
AST: 46 U/L — ABNORMAL HIGH (ref 15–41)
Albumin: 2.8 g/dL — ABNORMAL LOW (ref 3.5–5.0)
Albumin: 2.7 g/dL — ABNORMAL LOW (ref 3.5–5.0)
Alkaline Phosphatase: 91 U/L (ref 38–126)
Alkaline Phosphatase: 87 U/L (ref 38–126)
Anion gap: 10 (ref 5–15)
Anion gap: 9 (ref 5–15)
BUN: 13 mg/dL (ref 8–23)
BUN: 10 mg/dL (ref 8–23)
CO2: 20 mmol/L — ABNORMAL LOW (ref 22–32)
CO2: 19 mmol/L — ABNORMAL LOW (ref 22–32)
Calcium: 8.5 mg/dL — ABNORMAL LOW (ref 8.9–10.3)
Calcium: 8.4 mg/dL — ABNORMAL LOW (ref 8.9–10.3)
Chloride: 104 mmol/L (ref 98–111)
Chloride: 107 mmol/L (ref 98–111)
Creatinine, Ser: 1.05 mg/dL (ref 0.61–1.24)
Creatinine, Ser: 0.98 mg/dL (ref 0.61–1.24)
GFR, Estimated: >60 mL/min (ref >60)
GFR, Estimated: >60 mL/min (ref >60)
Glucose, Bld: 111 mg/dL — ABNORMAL HIGH (ref 70–99)
Glucose, Bld: 121 mg/dL — ABNORMAL HIGH (ref 70–99)
Potassium: 4 mmol/L (ref 3.5–5.1)
Potassium: 3.7 mmol/L (ref 3.5–5.1)
Sodium: 134 mmol/L — ABNORMAL LOW (ref 135–145)
Sodium: 135 mmol/L (ref 135–145)
Total Bilirubin: 5 mg/dL — ABNORMAL HIGH (ref 0.0–1.2)
Total Bilirubin: 3.9 mg/dL — ABNORMAL HIGH (ref 0.0–1.2)
Total Protein: 5.5 g/dL — ABNORMAL LOW (ref 6.5–8.1)
Total Protein: 5.5 g/dL — ABNORMAL LOW (ref 6.5–8.1)

## 2024-03-19 LAB — GLUCOSE, CAPILLARY
Glucose-Capillary: 143 mg/dL — ABNORMAL HIGH (ref 70–99)
Glucose-Capillary: 108 mg/dL — ABNORMAL HIGH (ref 70–99)
Glucose-Capillary: 105 mg/dL — ABNORMAL HIGH (ref 70–99)
Glucose-Capillary: 139 mg/dL — ABNORMAL HIGH (ref 70–99)

## 2024-03-19 LAB — LIPASE, BLOOD
Lipase: 95 U/L — ABNORMAL HIGH (ref 11–51)
Lipase: 65 U/L — ABNORMAL HIGH (ref 11–51)

## 2024-03-19 MED ORDER — ONDANSETRON HCL 4 MG/2ML IJ SOLN
4.0000 mg | Freq: Four times a day (QID) | INTRAMUSCULAR | Status: DC | PRN
  Administered 2024-03-21: 4 mg via INTRAVENOUS
  Filled 2024-03-19: qty 2

## 2024-03-19 MED ORDER — PANTOPRAZOLE SODIUM 40 MG IV SOLR
40.0000 mg | Freq: Two times a day (BID) | INTRAVENOUS | Status: DC
  Administered 2024-03-19 – 2024-03-23 (×8): 40 mg via INTRAVENOUS
  Filled 2024-03-19 (×8): qty 10

## 2024-03-19 MED ORDER — SODIUM CHLORIDE 0.9 % IV SOLN: 2.0000 g | Freq: Every day | INTRAVENOUS | Status: DC

## 2024-03-19 MED ORDER — SODIUM CHLORIDE 0.9 % IV SOLN: INTRAVENOUS | Status: DC

## 2024-03-19 MED ORDER — SODIUM CHLORIDE 0.9 % IV SOLN
12.5000 mg | Freq: Four times a day (QID) | INTRAVENOUS | Status: DC | PRN
  Administered 2024-03-19 – 2024-03-20 (×5): 12.5 mg via INTRAVENOUS
  Filled 2024-03-19 (×6): qty 0.5

## 2024-03-19 MED ORDER — PIPERACILLIN-TAZOBACTAM 3.375 G IVPB
3.3750 g | Freq: Three times a day (TID) | INTRAVENOUS | Status: DC
  Administered 2024-03-19 – 2024-03-23 (×12): 3.375 g via INTRAVENOUS
  Filled 2024-03-19 (×12): qty 50

## 2024-03-19 NOTE — Plan of Care (Signed)
  Problem: Activity: Goal: Risk for activity intolerance will decrease Outcome: Progressing   Problem: Pain Managment: Goal: General experience of comfort will improve and/or be controlled Outcome: Progressing   Problem: Safety: Goal: Ability to remain free from injury will improve Outcome: Progressing   Problem: Skin Integrity: Goal: Risk for impaired skin integrity will decrease Outcome: Progressing

## 2024-03-19 NOTE — Progress Notes (Signed)
 PHARMACY - PHYSICIAN COMMUNICATION CRITICAL VALUE ALERT - BLOOD CULTURE IDENTIFICATION (BCID)  Sean Golden is an 78 y.o. male who presented to [facility] on 03/17/2024 with a chief complaint of abdominal pain  Assessment:   Blood culture growing E. Coli  Name of physician (or Provider) Contacted:  Dr. Shona  Current antibiotics:  Zosyn   Changes to prescribed antibiotics recommended:  Change to Rocephin 2 g IV q24h  Results for orders placed or performed during the hospital encounter of 03/17/24  Blood Culture ID Panel (Reflexed) (Collected: 03/18/2024  9:12 AM)  Result Value Ref Range   Enterococcus faecalis NOT DETECTED NOT DETECTED   Enterococcus Faecium NOT DETECTED NOT DETECTED   Listeria monocytogenes NOT DETECTED NOT DETECTED   Staphylococcus species NOT DETECTED NOT DETECTED   Staphylococcus aureus (BCID) NOT DETECTED NOT DETECTED   Staphylococcus epidermidis NOT DETECTED NOT DETECTED   Staphylococcus lugdunensis NOT DETECTED NOT DETECTED   Streptococcus species NOT DETECTED NOT DETECTED   Streptococcus agalactiae NOT DETECTED NOT DETECTED   Streptococcus pneumoniae NOT DETECTED NOT DETECTED   Streptococcus pyogenes NOT DETECTED NOT DETECTED   A.calcoaceticus-baumannii NOT DETECTED NOT DETECTED   Bacteroides fragilis NOT DETECTED NOT DETECTED   Enterobacterales DETECTED (A) NOT DETECTED   Enterobacter cloacae complex NOT DETECTED NOT DETECTED   Escherichia coli DETECTED (A) NOT DETECTED   Klebsiella aerogenes NOT DETECTED NOT DETECTED   Klebsiella oxytoca NOT DETECTED NOT DETECTED   Klebsiella pneumoniae NOT DETECTED NOT DETECTED   Proteus species NOT DETECTED NOT DETECTED   Salmonella species NOT DETECTED NOT DETECTED   Serratia marcescens NOT DETECTED NOT DETECTED   Haemophilus influenzae NOT DETECTED NOT DETECTED   Neisseria meningitidis NOT DETECTED NOT DETECTED   Pseudomonas aeruginosa NOT DETECTED NOT DETECTED   Stenotrophomonas maltophilia NOT DETECTED NOT  DETECTED   Candida albicans NOT DETECTED NOT DETECTED   Candida auris NOT DETECTED NOT DETECTED   Candida glabrata NOT DETECTED NOT DETECTED   Candida krusei NOT DETECTED NOT DETECTED   Candida parapsilosis NOT DETECTED NOT DETECTED   Candida tropicalis NOT DETECTED NOT DETECTED   Cryptococcus neoformans/gattii NOT DETECTED NOT DETECTED   CTX-M ESBL NOT DETECTED NOT DETECTED   Carbapenem resistance IMP NOT DETECTED NOT DETECTED   Carbapenem resistance KPC NOT DETECTED NOT DETECTED   Carbapenem resistance NDM NOT DETECTED NOT DETECTED   Carbapenem resist OXA 48 LIKE NOT DETECTED NOT DETECTED   Carbapenem resistance VIM NOT DETECTED NOT DETECTED    [name] 03/19/2024  6:11 AM

## 2024-03-19 NOTE — Consult Note (Addendum)
 Sean Golden November 24, 1945  [id number].    Requesting MD: Dr. Lethaniel Mao Chief Complaint/Reason for Consult: Cholelithiasis, choledocholithiasis, recurrent pancreatitis  HPI: Sean Golden is a 78 y.o. male who presented to [facility] on 6/25 for abdominal pain.  Patient reports on 6/25 after eating hard-boiled eggs he began having severe epigastric abdominal pain with associated nausea and vomiting.  No fever or change in bowel habits.  He has history of similar symptoms in the past when he had pancreatitis in 2023 that he reports required him to be on TPN.  Per patient report, his pancreatitis in the past was thought to be due to alcohol use.  He used to drink 2 glasses of wine at night.  He no longer uses alcohol.  He underwent workup since presentation that was concerning for cholelithiasis with possible acute cholecystitis, choledocholithiasis, and recurrent pancreatitis.  Lipase was 2089 on admission.  Repeat pending.  T. bili remains elevated at 5 from 7.5.  Triglycerides 61.  He was transferred to [facility] for GI evaluation and possible ERCP.  General surgery was asked to see for consideration of laparoscopic cholecystectomy. He continues to have epigastric abdominal pain, although improved since admission, and currently rated as a 3/10.   Patient reports that he is on Eliquis  with last dose on 6/25.  He does note he cannot walk up a flight of stairs or more than 30 feet without significant short of breath.  No chest pain.  He reports she has been seen by pulmonology and cardiology for this as an outpatient.  He denies any prior abdominal surgeries. PMHx noted for atrial fibrillation, essential hypertension, GERD, sleep apnea and obesity.   ROS: ROS As above, see hpi   Family History  Problem Relation Age of Onset   Hypertension Mother    Heart attack Father    Hyperlipidemia Sister    Hyperlipidemia Brother    Colon polyps Brother    Hypertension Brother    Hypertension Sister     Diabetes Brother    Stroke Other    Diabetes Other    Hypertension Other    Hyperlipidemia Other    Colon cancer Neg Hx     Past Medical History:  Diagnosis Date   Atrial fibrillation (HCC) 05/2014   Essential hypertension    GERD (gastroesophageal reflux disease)    Helicobacter pylori gastritis 08/2014   Treated with Pylera   Pancreatitis    Sleep apnea     Past Surgical History:  Procedure Laterality Date   CARDIAC ELECTROPHYSIOLOGY STUDY AND ABLATION  2010?   COLONOSCOPY  2005   SOLITARY RECTAL ULCER   COLONOSCOPY N/A 09/06/2014   Dr. Harvey: three colon polyps, moderate diverticulosis throughout the entire examined colon   ESOPHAGOGASTRODUODENOSCOPY N/A 09/06/2014   Dr. Fields:moderate erosive gastritis, no Barrett's. +H.pylori gastritis, treated with Pylera   ESOPHAGOGASTRODUODENOSCOPY (EGD) WITH PROPOFOL  N/A 11/17/2021   Procedure: ESOPHAGOGASTRODUODENOSCOPY (EGD) WITH PROPOFOL ;  Surgeon: Golda Claudis PENNER, MD;  [facility];  Service: Endoscopy;  Laterality: N/A;   ESOPHAGOGASTRODUODENOSCOPY (EGD) WITH PROPOFOL  N/A 11/27/2021   Procedure: ESOPHAGOGASTRODUODENOSCOPY (EGD) WITH PROPOFOL ;  Surgeon: Eartha Angelia Sieving, MD;  Location: [facility];  Service: Gastroenterology;  Laterality: N/A;   HEMORRHOID BANDING N/A 09/06/2014   NO BANDING PERFORMED. NO INTERNAL HEMORRHOIDS IDENTIFIED    UPPER GASTROINTESTINAL ENDOSCOPY  2005 [facility]   VASECTOMY      Social History:  reports that he quit smoking about 39 years ago. His  smoking use included cigarettes. He started smoking about 56 years ago. He has a 8.5 pack-year smoking history. He has been exposed to tobacco smoke. He has never used smokeless tobacco. He reports that he does not currently use alcohol after a past usage of about 14.0 standard drinks of alcohol per week. He reports that he does not use drugs.  Allergies: No Known Allergies  Medications Prior to Admission  Medication Sig Dispense Refill    acetaminophen  (TYLENOL ) 325 MG tablet Take 2 tablets (650 mg total) by mouth every 6 (six) hours as needed for mild pain, fever or headache (or Fever >/= 101). (Patient taking differently: Take 1,000 mg by mouth every 6 (six) hours as needed for mild pain (pain score 1-3), fever or headache (or Fever >/= 101).)     apixaban  (ELIQUIS ) 5 MG TABS tablet Take 1 tablet (5 mg total) by mouth 2 (two) times daily. 60 tablet 5   diltiazem  (CARDIZEM  CD) 300 MG 24 hr capsule Take 1 capsule (300 mg total) by mouth daily. 90 capsule 3   pantoprazole  (PROTONIX ) 40 MG tablet Take 1 tablet (40 mg total) by mouth 2 (two) times daily. 60 tablet 11   spironolactone  (ALDACTONE ) 25 MG tablet Take 1 tablet (25 mg total) by mouth daily. 90 tablet 3   tamsulosin  (FLOMAX ) 0.4 MG CAPS capsule Take 2 capsules (0.8 mg total) by mouth every evening. 60 capsule 2     Physical Exam: Blood pressure 112/65, pulse 80, temperature 98.5 F (36.9 C), temperature source Oral, resp. rate 17, height 5' 10 (1.778 m), weight 109.7 kg, SpO2 96%. General: pleasant, WD/WN male who is laying in bed in NAD HEENT: head is normocephalic, atraumatic.  Sclera are non-icteric. E Heart: regular, rate, and rhythm.   Lungs: Respiratory effort nonlabored Abd: Soft, protuberant with mild distension, epigastric ttp, +BS. No masses, hernias, or organomegaly MS: no BUE or BLE edema Skin: warm and dry  Psych: A&Ox4 with an appropriate affect Neuro: normal speech, thought process intact, moves all extremities, gait not assessed   Results for orders placed or performed during the hospital encounter of 03/17/24 (from the past 48 hours)  Lipase, blood     Status: Abnormal   Collection Time: 03/17/24  5:04 PM  Result Value Ref Range   Lipase 2,089 (H) 11 - 51 U/L    Comment: RESULTS CONFIRMED BY MANUAL DILUTION Performed at [facility], [address], [place], [place] [address]   Comprehensive metabolic panel     Status: Abnormal   Collection  Time: 03/17/24  5:04 PM  Result Value Ref Range   Sodium 134 (L) 135 - 145 mmol/L   Potassium 4.0 3.5 - 5.1 mmol/L   Chloride 103 98 - 111 mmol/L   CO2 17 (L) 22 - 32 mmol/L   Glucose, Bld 220 (H) 70 - 99 mg/dL    Comment: Glucose reference range applies only to samples taken after fasting for at least 8 hours.   BUN 17 8 - 23 mg/dL   Creatinine, Ser 8.96 0.61 - 1.24 mg/dL   Calcium  9.1 8.9 - 10.3 mg/dL   Total Protein 6.9 6.5 - 8.1 g/dL   Albumin  3.9 3.5 - 5.0 g/dL   AST 752 (H) 15 - 41 U/L   ALT 126 (H) 0 - 44 U/L   Alkaline Phosphatase 91 38 - 126 U/L   Total Bilirubin 2.5 (H) 0.0 - 1.2 mg/dL   GFR, Estimated >39 >39 mL/min    Comment: (NOTE)  Calculated using the CKD-EPI Creatinine Equation (2021)    Anion gap 14 5 - 15    Comment: Performed at [facility], [address], [place], [place] [address]  CBC with Differential     Status: Abnormal   Collection Time: 03/17/24  5:04 PM  Result Value Ref Range   WBC 17.7 (H) 4.0 - 10.5 K/uL   RBC 5.66 4.22 - 5.81 MIL/uL   Hemoglobin 18.5 (H) 13.0 - 17.0 g/dL   HCT 46.4 (H) 60.9 - 47.9 %   MCV 94.5 80.0 - 100.0 fL   MCH 32.7 26.0 - 34.0 pg   MCHC 34.6 30.0 - 36.0 g/dL   RDW 87.4 88.4 - 84.4 %   Platelets 318 150 - 400 K/uL   nRBC 0.0 0.0 - 0.2 %   Neutrophils Relative % 91 %   Neutro Abs 16.2 (H) 1.7 - 7.7 K/uL   Lymphocytes Relative 5 %   Lymphs Abs 0.9 0.7 - 4.0 K/uL   Monocytes Relative 3 %   Monocytes Absolute 0.5 0.1 - 1.0 K/uL   Eosinophils Relative 0 %   Eosinophils Absolute 0.0 0.0 - 0.5 K/uL   Basophils Relative 0 %   Basophils Absolute 0.0 0.0 - 0.1 K/uL   Immature Granulocytes 1 %   Abs Immature Granulocytes 0.09 (H) 0.00 - 0.07 K/uL    Comment: Performed at [facility], [address], [place], [place] [address]  Hemoglobin A1c     Status: None   Collection Time: 03/17/24  5:04 PM  Result Value Ref Range   Hgb A1c MFr Bld 5.6 4.8 - 5.6 %    Comment: (NOTE) Diagnosis of Diabetes The following HbA1c ranges  recommended by the American Diabetes Association (ADA) may be used as an aid in the diagnosis of diabetes mellitus.  Hemoglobin             Suggested A1C NGSP%              Diagnosis  <5.7                   Non Diabetic  5.7-6.4                Pre-Diabetic  >6.4                   Diabetic  <7.0                   Glycemic control for                       adults with diabetes.     Mean Plasma Glucose 114.02 mg/dL    Comment: Performed at [facility], [address] [place], [place] [address]  Urinalysis, Routine w reflex microscopic -Urine, Clean Catch     Status: Abnormal   Collection Time: 03/17/24  9:39 PM  Result Value Ref Range   Color, Urine AMBER (A) YELLOW    Comment: BIOCHEMICALS MAY BE AFFECTED BY COLOR   APPearance CLEAR CLEAR   Specific Gravity, Urine 1.025 1.005 - 1.030   pH 5.0 5.0 - 8.0   Glucose, UA NEGATIVE NEGATIVE mg/dL   Hgb urine dipstick NEGATIVE NEGATIVE   Bilirubin Urine SMALL (A) NEGATIVE   Ketones, ur 5 (A) NEGATIVE mg/dL   Protein, ur 30 (A) NEGATIVE mg/dL   Nitrite NEGATIVE NEGATIVE   Leukocytes,Ua NEGATIVE NEGATIVE   RBC / HPF 0-5 0 - 5 RBC/hpf  WBC, UA 0-5 0 - 5 WBC/hpf   Bacteria, UA NONE SEEN NONE SEEN   Squamous Epithelial / HPF 0-5 0 - 5 /HPF   Mucus PRESENT     Comment: Performed at [facility], [address], [place], [place] [address]  CBG monitoring, ED     Status: Abnormal   Collection Time: 03/18/24  8:54 AM  Result Value Ref Range   Glucose-Capillary 152 (H) 70 - 99 mg/dL    Comment: Glucose reference range applies only to samples taken after fasting for at least 8 hours.  Comprehensive metabolic panel with GFR     Status: Abnormal   Collection Time: 03/18/24  8:57 AM  Result Value Ref Range   Sodium 135 135 - 145 mmol/L   Potassium 4.4 3.5 - 5.1 mmol/L   Chloride 106 98 - 111 mmol/L   CO2 15 (L) 22 - 32 mmol/L   Glucose, Bld 153 (H) 70 - 99 mg/dL    Comment: Glucose reference range applies only to samples taken  after fasting for at least 8 hours.   BUN 18 8 - 23 mg/dL   Creatinine, Ser 9.10 0.61 - 1.24 mg/dL   Calcium  8.6 (L) 8.9 - 10.3 mg/dL   Total Protein 6.7 6.5 - 8.1 g/dL   Albumin  3.5 3.5 - 5.0 g/dL   AST 709 (H) 15 - 41 U/L   ALT 295 (H) 0 - 44 U/L   Alkaline Phosphatase 112 38 - 126 U/L   Total Bilirubin 7.5 (H) 0.0 - 1.2 mg/dL    Comment: DELTA CHECK NOTED   GFR, Estimated >60 >60 mL/min    Comment: (NOTE) Calculated using the CKD-EPI Creatinine Equation (2021)    Anion gap 14 5 - 15    Comment: Performed at [facility], [address], [place], [place] [address]  CBC     Status: Abnormal   Collection Time: 03/18/24  8:57 AM  Result Value Ref Range   WBC 24.4 (H) 4.0 - 10.5 K/uL   RBC 5.23 4.22 - 5.81 MIL/uL   Hemoglobin 16.7 13.0 - 17.0 g/dL   HCT 50.2 60.9 - 47.9 %   MCV 95.0 80.0 - 100.0 fL   MCH 31.9 26.0 - 34.0 pg   MCHC 33.6 30.0 - 36.0 g/dL   RDW 87.0 88.4 - 84.4 %   Platelets 280 150 - 400 K/uL   nRBC 0.0 0.0 - 0.2 %    Comment: Performed at [facility], [address], [place], [place] [address]  Culture, blood (Routine X 2) w Reflex to ID Panel     Status: None (Preliminary result)   Collection Time: 03/18/24  8:57 AM   Specimen: BLOOD  Result Value Ref Range   Specimen Description BLOOD BLOOD LEFT ARM    Special Requests      BOTTLES DRAWN AEROBIC AND ANAEROBIC Blood Culture results may not be optimal due to an inadequate volume of blood received in culture bottles   Culture      NO GROWTH < 24 HOURS Performed at [facility], [address], [place], [place] [address]    Report Status PENDING   Procalcitonin     Status: None   Collection Time: 03/18/24  8:57 AM  Result Value Ref Range   Procalcitonin 37.18 ng/mL    Comment:        Interpretation: PCT >= 10 ng/mL: Important systemic inflammatory response, almost exclusively due to severe bacterial sepsis or septic shock. (NOTE)  Sepsis PCT Algorithm           Lower Respiratory Tract                                       Infection PCT Algorithm    ----------------------------     ----------------------------         PCT < 0.25 ng/mL                PCT < 0.10 ng/mL          Strongly encourage             Strongly discourage   discontinuation of antibiotics    initiation of antibiotics    ----------------------------     -----------------------------       PCT 0.25 - 0.50 ng/mL            PCT 0.10 - 0.25 ng/mL               OR       >80% decrease in PCT            Discourage initiation of                                            antibiotics      Encourage discontinuation           of antibiotics    ----------------------------     -----------------------------         PCT >= 0.50 ng/mL              PCT 0.26 - 0.50 ng/mL                AND       <80% decrease in PCT             Encourage initiation of                                             antibiotics       Encourage continuation           of antibiotics    ----------------------------     -----------------------------        PCT >= 0.50 ng/mL                  PCT > 0.50 ng/mL               AND         increase in PCT                  Strongly encourage                                      initiation of antibiotics    Strongly encourage escalation           of antibiotics                                     -----------------------------  PCT <= 0.25 ng/mL                                                 OR                                        > 80% decrease in PCT                                      Discontinue / Do not initiate                                             antibiotics  Performed at [facility], [address], [place], [place] [address]   Culture, blood (Routine X 2) w Reflex to ID Panel     Status: None (Preliminary result)   Collection Time: 03/18/24  9:12 AM   Specimen: BLOOD  Result Value Ref Range   Specimen Description      BLOOD BLOOD LEFT  HAND Performed at [facility], [address] [place], [place] [address]    Special Requests      BOTTLES DRAWN AEROBIC AND ANAEROBIC Blood Culture results may not be optimal due to an inadequate volume of blood received in culture bottles Performed at [facility], [address], [place], [place] [address]    Culture  Setup Time      GRAM NEGATIVE RODS IN BOTH AEROBIC AND ANAEROBIC BOTTLES Gram Stain Report Called to,Read Back By and Verified With: LEVORN LEEDS, RN [facility], [phone], AT 2238 03/18/24 BY A. SNYDER CRITICAL RESULT CALLED TO, READ BACK BY AND VERIFIED WITH: PHARMD G ABBOTT 03/19/2024 @ 0418 BY AB Performed at [facility], [address], [place], [place] [address]    Culture GRAM NEGATIVE RODS    Report Status PENDING   Blood Culture ID Panel (Reflexed)     Status: Abnormal   Collection Time: 03/18/24  9:12 AM  Result Value Ref Range   Enterococcus faecalis NOT DETECTED NOT DETECTED   Enterococcus Faecium NOT DETECTED NOT DETECTED   Listeria monocytogenes NOT DETECTED NOT DETECTED   Staphylococcus species NOT DETECTED NOT DETECTED   Staphylococcus aureus (BCID) NOT DETECTED NOT DETECTED   Staphylococcus epidermidis NOT DETECTED NOT DETECTED   Staphylococcus lugdunensis NOT DETECTED NOT DETECTED   Streptococcus species NOT DETECTED NOT DETECTED   Streptococcus agalactiae NOT DETECTED NOT DETECTED   Streptococcus pneumoniae NOT DETECTED NOT DETECTED   Streptococcus pyogenes NOT DETECTED NOT DETECTED   A.calcoaceticus-baumannii NOT DETECTED NOT DETECTED   Bacteroides fragilis NOT DETECTED NOT DETECTED   Enterobacterales DETECTED (A) NOT DETECTED    Comment: Enterobacterales represent a large order of gram negative bacteria, not a single organism. CRITICAL RESULT CALLED TO, READ BACK BY AND VERIFIED WITH: PHARMD G ABBOTT 03/19/2024 @ 0418 BY AB    Enterobacter cloacae complex NOT DETECTED NOT DETECTED   Escherichia coli DETECTED (A) NOT DETECTED    Comment:  CRITICAL RESULT CALLED TO, READ BACK BY AND VERIFIED WITH: PHARMD G ABBOTT 03/19/2024 @ 9581  BY AB    Klebsiella aerogenes NOT DETECTED NOT DETECTED   Klebsiella oxytoca NOT DETECTED NOT DETECTED   Klebsiella pneumoniae NOT DETECTED NOT DETECTED   Proteus species NOT DETECTED NOT DETECTED   Salmonella species NOT DETECTED NOT DETECTED   Serratia marcescens NOT DETECTED NOT DETECTED   Haemophilus influenzae NOT DETECTED NOT DETECTED   Neisseria meningitidis NOT DETECTED NOT DETECTED   Pseudomonas aeruginosa NOT DETECTED NOT DETECTED   Stenotrophomonas maltophilia NOT DETECTED NOT DETECTED   Candida albicans NOT DETECTED NOT DETECTED   Candida auris NOT DETECTED NOT DETECTED   Candida glabrata NOT DETECTED NOT DETECTED   Candida krusei NOT DETECTED NOT DETECTED   Candida parapsilosis NOT DETECTED NOT DETECTED   Candida tropicalis NOT DETECTED NOT DETECTED   Cryptococcus neoformans/gattii NOT DETECTED NOT DETECTED   CTX-M ESBL NOT DETECTED NOT DETECTED   Carbapenem resistance IMP NOT DETECTED NOT DETECTED   Carbapenem resistance KPC NOT DETECTED NOT DETECTED   Carbapenem resistance NDM NOT DETECTED NOT DETECTED   Carbapenem resist OXA 48 LIKE NOT DETECTED NOT DETECTED   Carbapenem resistance VIM NOT DETECTED NOT DETECTED    Comment: Performed at [facility], [address], [place], [place] [address]  Lipid panel     Status: Abnormal   Collection Time: 03/18/24  9:19 AM  Result Value Ref Range   Cholesterol 148 0 - 200 mg/dL   Triglycerides 61 <849 mg/dL   HDL 39 (L) >59 mg/dL   Total CHOL/HDL Ratio 3.8 RATIO   VLDL 12 0 - 40 mg/dL   LDL Cholesterol 97 0 - 99 mg/dL    Comment:        Total Cholesterol/HDL:CHD Risk Coronary Heart Disease Risk Table                     Men   Women  1/2 Average Risk   3.4   3.3  Average Risk       5.0   4.4  2 X Average Risk   9.6   7.1  3 X Average Risk  23.4   11.0        Use the calculated Patient Ratio above and the CHD Risk  Table to determine the patient's CHD Risk.        ATP III CLASSIFICATION (LDL):  <100     mg/dL   Optimal  899-870  mg/dL   Near or Above                    Optimal  130-159  mg/dL   Borderline  839-810  mg/dL   High  >809     mg/dL   Very High Performed at [facility], [address], [place], [place] [address]   CBG monitoring, ED     Status: Abnormal   Collection Time: 03/18/24 12:56 PM  Result Value Ref Range   Glucose-Capillary 128 (H) 70 - 99 mg/dL    Comment: Glucose reference range applies only to samples taken after fasting for at least 8 hours.  Glucose, capillary     Status: Abnormal   Collection Time: 03/18/24  5:58 PM  Result Value Ref Range   Glucose-Capillary 115 (H) 70 - 99 mg/dL    Comment: Glucose reference range applies only to samples taken after fasting for at least 8 hours.  Glucose, capillary     Status: Abnormal   Collection Time: 03/18/24  9:08 PM  Result Value Ref Range  Glucose-Capillary 142 (H) 70 - 99 mg/dL    Comment: Glucose reference range applies only to samples taken after fasting for at least 8 hours.  Comprehensive metabolic panel with GFR     Status: Abnormal   Collection Time: 03/19/24  4:00 AM  Result Value Ref Range   Sodium 134 (L) 135 - 145 mmol/L   Potassium 4.0 3.5 - 5.1 mmol/L   Chloride 104 98 - 111 mmol/L   CO2 20 (L) 22 - 32 mmol/L   Glucose, Bld 111 (H) 70 - 99 mg/dL    Comment: Glucose reference range applies only to samples taken after fasting for at least 8 hours.   BUN 13 8 - 23 mg/dL   Creatinine, Ser 8.94 0.61 - 1.24 mg/dL   Calcium  8.5 (L) 8.9 - 10.3 mg/dL   Total Protein 5.5 (L) 6.5 - 8.1 g/dL   Albumin  2.8 (L) 3.5 - 5.0 g/dL   AST 898 (H) 15 - 41 U/L   ALT 177 (H) 0 - 44 U/L   Alkaline Phosphatase 91 38 - 126 U/L   Total Bilirubin 5.0 (H) 0.0 - 1.2 mg/dL   GFR, Estimated >39 >39 mL/min    Comment: (NOTE) Calculated using the CKD-EPI Creatinine Equation (2021)    Anion gap 10 5 - 15    Comment: Performed at  [facility], [address] [place], [place] [address]  Glucose, capillary     Status: Abnormal   Collection Time: 03/19/24  8:09 AM  Result Value Ref Range   Glucose-Capillary 143 (H) 70 - 99 mg/dL    Comment: Glucose reference range applies only to samples taken after fasting for at least 8 hours.   US  Abdomen Limited RUQ (LIVER/GB) Result Date: 03/18/2024 CLINICAL DATA:  Right upper quadrant pain. EXAM: ULTRASOUND ABDOMEN LIMITED RIGHT UPPER QUADRANT COMPARISON:  None Available. FINDINGS: Gallbladder: A 6.7 mm gallstone is seen near the neck of the gallbladder. There is no evidence of gallbladder wall thickening (2.2 mm). No sonographic Murphy sign noted by sonographer. Common bile duct: Diameter: 5.3 mm Liver: No focal lesion identified. Diffusely increased echogenicity of the liver parenchyma is noted. Portal vein is patent on color Doppler imaging with normal direction of blood flow towards the liver. Other: None. IMPRESSION: 1. Cholelithiasis without evidence of acute cholecystitis. 2. Hepatic steatosis. Electronically Signed   By: Suzen Dials M.D.   On: 03/18/2024 09:27   MR ABDOMEN MRCP W WO CONTAST Result Date: 03/18/2024 CLINICAL DATA:  Cholelithiasis.  Pancreatitis, acute, severe. EXAM: MRI ABDOMEN WITHOUT AND WITH CONTRAST (INCLUDING MRCP) TECHNIQUE: Multiplanar multisequence MR imaging of the abdomen was performed both before and after the administration of intravenous contrast. Heavily T2-weighted images of the biliary and pancreatic ducts were obtained, and three-dimensional MRCP images were rendered by post processing. CONTRAST:  10mL GADAVIST  GADOBUTROL  1 MMOL/ML IV SOLN COMPARISON:  CT scan abdomen and pelvis from 03/17/2024. FINDINGS: Lower chest: Unremarkable MR appearance to the lung bases. No pleural effusion. No pericardial effusion. Normal heart size. Hepatobiliary: The liver is normal in size. Noncirrhotic configuration. There is mild diffuse hepatic steatosis. Is  there is a 5 mm simple cyst in the left hepatic lobe, segment 2. No suspicious liver lesion. The portal vein and hepatic veins are patent. No intrahepatic or extrahepatic bile duct dilatation. However, there is a single approximately 4 x 7 mm filling defect noted in the midportion of common bile duct (series 11, image 12), which may represent a choledocholithiasis. However, this  filling defect is not seen on any other sequences including the MRCP sequences. Gallbladder is physiologically distended. Small volume dependent gallstones noted. No abnormal gallbladder wall thickening or pericholecystic fat stranding. There is trace amount of fluid in the interface between the gallbladder and liver, likely reactive ascites from pancreatitis. Pancreas: There is slightly heterogeneous and bulky pancreatic body/tail with peripancreatic fat stranding, compatible with acute interstitial pancreatitis. No pancreatic necrosis or peripancreatic/intrapancreatic collections. Main pancreatic duct is not dilated. No suspicious pancreatic lesion. Spleen:  Within normal limits in size and appearance. No focal mass. Adrenals/Urinary Tract: Unremarkable adrenal glands. No hydroureteronephrosis. No suspicious renal mass. There multiple scattered cysts throughout bilateral kidneys with largest in the left kidney lower pole measuring up to 1.3 x 1.3 cm. Stomach/Bowel: Visualized portions within the abdomen are unremarkable. No disproportionate dilation of bowel loops. There multiple diverticula throughout the colon without imaging signs of diverticulitis. Vascular/Lymphatic: No pathologically enlarged lymph nodes identified. No abdominal aortic aneurysm demonstrated. There is trace amount of free fluid in the central upper abdomen as well as mild amount of fat stranding along the mesenteric vessels, favored reactive in the settings of pancreatitis. No walled-off abscess or loculated collection. Other:  There is small fat containing  periumbilical hernia. Musculoskeletal: A hemangioma is noted in the right aspect of the L2 vertebral body. IMPRESSION: 1. Acute interstitial pancreatitis. No pancreatic necrosis or peripancreatic/intrapancreatic collections. No main pancreatic duct dilation. No intra or extrahepatic bile duct dilation. There is a probable single 4 x 7 mm choledocholithiasis in the midportion of common bile duct. Small volume cholelithiasis without acute cholecystitis. 2. Multiple other nonacute observations, as described above. Electronically Signed   By: Ree Molt M.D.   On: 03/18/2024 08:54   MR 3D Recon At Scanner Result Date: 03/18/2024 CLINICAL DATA:  Cholelithiasis.  Pancreatitis, acute, severe. EXAM: MRI ABDOMEN WITHOUT AND WITH CONTRAST (INCLUDING MRCP) TECHNIQUE: Multiplanar multisequence MR imaging of the abdomen was performed both before and after the administration of intravenous contrast. Heavily T2-weighted images of the biliary and pancreatic ducts were obtained, and three-dimensional MRCP images were rendered by post processing. CONTRAST:  10mL GADAVIST  GADOBUTROL  1 MMOL/ML IV SOLN COMPARISON:  CT scan abdomen and pelvis from 03/17/2024. FINDINGS: Lower chest: Unremarkable MR appearance to the lung bases. No pleural effusion. No pericardial effusion. Normal heart size. Hepatobiliary: The liver is normal in size. Noncirrhotic configuration. There is mild diffuse hepatic steatosis. Is there is a 5 mm simple cyst in the left hepatic lobe, segment 2. No suspicious liver lesion. The portal vein and hepatic veins are patent. No intrahepatic or extrahepatic bile duct dilatation. However, there is a single approximately 4 x 7 mm filling defect noted in the midportion of common bile duct (series 11, image 12), which may represent a choledocholithiasis. However, this filling defect is not seen on any other sequences including the MRCP sequences. Gallbladder is physiologically distended. Small volume dependent  gallstones noted. No abnormal gallbladder wall thickening or pericholecystic fat stranding. There is trace amount of fluid in the interface between the gallbladder and liver, likely reactive ascites from pancreatitis. Pancreas: There is slightly heterogeneous and bulky pancreatic body/tail with peripancreatic fat stranding, compatible with acute interstitial pancreatitis. No pancreatic necrosis or peripancreatic/intrapancreatic collections. Main pancreatic duct is not dilated. No suspicious pancreatic lesion. Spleen:  Within normal limits in size and appearance. No focal mass. Adrenals/Urinary Tract: Unremarkable adrenal glands. No hydroureteronephrosis. No suspicious renal mass. There multiple scattered cysts throughout bilateral kidneys with largest in the left kidney lower  pole measuring up to 1.3 x 1.3 cm. Stomach/Bowel: Visualized portions within the abdomen are unremarkable. No disproportionate dilation of bowel loops. There multiple diverticula throughout the colon without imaging signs of diverticulitis. Vascular/Lymphatic: No pathologically enlarged lymph nodes identified. No abdominal aortic aneurysm demonstrated. There is trace amount of free fluid in the central upper abdomen as well as mild amount of fat stranding along the mesenteric vessels, favored reactive in the settings of pancreatitis. No walled-off abscess or loculated collection. Other:  There is small fat containing periumbilical hernia. Musculoskeletal: A hemangioma is noted in the right aspect of the L2 vertebral body. IMPRESSION: 1. Acute interstitial pancreatitis. No pancreatic necrosis or peripancreatic/intrapancreatic collections. No main pancreatic duct dilation. No intra or extrahepatic bile duct dilation. There is a probable single 4 x 7 mm choledocholithiasis in the midportion of common bile duct. Small volume cholelithiasis without acute cholecystitis. 2. Multiple other nonacute observations, as described above. Electronically  Signed   By: Ree Molt M.D.   On: 03/18/2024 08:54   CT ABDOMEN PELVIS W CONTRAST Result Date: 03/17/2024 CLINICAL DATA:  Abdominal pain. EXAM: CT ABDOMEN AND PELVIS WITH CONTRAST TECHNIQUE: Multidetector CT imaging of the abdomen and pelvis was performed using the standard protocol following bolus administration of intravenous contrast. RADIATION DOSE REDUCTION: This exam was performed according to the departmental dose-optimization program which includes automated exposure control, adjustment of the mA and/or kV according to patient size and/or use of iterative reconstruction technique. CONTRAST:  OMNIPAQUE  IOHEXOL  300 MG/ML  SOLN COMPARISON:  12/23/2022. FINDINGS: Lower chest: Dependent bibasilar subsegmental atelectasis or scarring. No pleural or pericardial effusions Hepatobiliary: No hepatic parenchymal abnormalities. Mild central biliary ductal prominence. Gallbladder contains other stones or polyps. Pericholecystic fat stranding. Pancreas: Peripancreatic fat stranding consistent with acute pancreatitis. No discrete collections. No pancreatic parenchymal abnormalities. Spleen: Normal in size without focal abnormality. Adrenals/Urinary Tract: No adrenal lesions. Left kidney left subcentimeter cyst. No hydronephrosis or nephrolithiasis. Unremarkable urinary bladder. Stomach/Bowel: Stomach is within normal limits. Appendix appears normal. No evidence of bowel wall thickening, distention, or inflammatory changes. Diffuse colonic diverticulosis. Vascular/Lymphatic: Aortic atherosclerosis. No enlarged abdominal or pelvic lymph nodes. Reproductive: Prostate is unremarkable. Other: Small periumbilical abdominal wall defect containing omental fat without evidence of incarceration or herniated bowel. No abdominopelvic ascites. Musculoskeletal: Lumbosacral degenerative changes. No acute osseous abnormality. IMPRESSION: 1. Acute pancreatitis. 2. Gallbladder stones or polyps. Pericholecystic fat stranding.  3. Diverticulosis. 4. Small periumbilical abdominal wall defect containing omental fat. 5. Aortic atherosclerosis (ICD10-I70.0). Electronically Signed   By: Fonda Field M.D.   On: 03/17/2024 21:40    Anti-infectives (From admission, onward)    Start     Dose/Rate Route Frequency Ordered Stop   03/19/24 1400  cefTRIAXone (ROCEPHIN) 2 g in sodium chloride  0.9 % 100 mL IVPB  Status:  Discontinued        2 g 200 mL/hr over 30 Minutes Intravenous Daily 03/19/24 0615 03/19/24 0629   03/19/24 1400  piperacillin -tazobactam (ZOSYN ) IVPB 3.375 g        3.375 g 12.5 mL/hr over 240 Minutes Intravenous Every 8 hours 03/19/24 0630     03/18/24 1400  piperacillin -tazobactam (ZOSYN ) IVPB 3.375 g  Status:  Discontinued        3.375 g 12.5 mL/hr over 240 Minutes Intravenous Every 8 hours 03/18/24 1113 03/19/24 0615       Assessment/Plan Acute on Chronic Pancreatitis  Cholelithiasis and Choledocholithiasis  - CT A/P 6/25 w/ acute pancreatitis, gallstones or polyps with pericholecystitic fat stranding -  RUQ US  6/26 w/ Cholelithiasis without evidence of acute cholecystitis. CBD 5.3 mm. Portal vein patent.  - MRCP 6/26 w/ acute interstitial pancreatitis without pancreatic necrosis or peripancreatic/intrapancreatic collections. There was evidence of choledocholithiasis in the midportion of common bile duct. Also noted, small volume cholelithiasis without acute cholecystitis changes.  - Afebrile. No tachycardia or hypotension.  - WBC 24.4. Abx per GI - Alk Phos 91. AST 101. ALT 177. T. Bili 5 from 7.5. GI consulted for possible ERCP given elevated LFT's with T. Bili > 4 and evidence of choledocholithiasis on imaging.  - Await recs from GI for ERCP. After Choledocholithiasis is addressed, pancreatitis resolves and Eliquis  has washed out would recommend laparoscopic cholecystectomy to prevent recurrence. Continue to trend labs. Would ask [facility] to ensure patient is optimized for general anesthesia. After  discussion with them, they are planning to have Cardiology see. We will follow with you.   FEN - Per GI VTE - SCDs, hold Eliquis , okay for therapeutic lovenox or heparin  gtt but would check with GI first incase they plan for ERCP today ID - On Zosyn   I reviewed nursing notes, hospitalist notes, last 24 h vitals and pain scores, last 48 h intake and output, last 24 h labs and trends, and last 24 h imaging results.  Ozell CHRISTELLA Shaper, [facility] 03/19/2024, 10:14 AM Please see Amion for pager number during day hours 7:00am-4:30pm

## 2024-03-19 NOTE — Progress Notes (Signed)
 PROGRESS NOTE    Sean Golden  FMW:[id number] DOB: 1946-04-24 DOA: 03/17/2024 PCP: [name], MD   Brief Narrative:  78 y.o. male with medical history significant of chronic pancreatitis, persistent atrial fibrillation, essential hypertension, GERD, sleep apnea, obesity presented with abdominal pain and nausea.  On presentation, WBC was 17.7, bicarb of 17, AST of 247, ALT of 126, total bili of 2.5, lipase of 2089.  CT of abdomen and pelvis showed acute pancreatitis with gallstones or polyps with pericholecystic fat stranding.  Right upper upper quadrant ultrasound showed cholelithiasis without acute cholecystitis and hepatic steatosis.  MRI/MRCP of abdomen showed acute interstitial pancreatitis without necrosis with probable choledocholithiasis in the midportion of common bile duct.  He was started on IV fluids and antibiotics and transferred to [facility] as per GI recommendations.  Assessment & Plan:   Acute on chronic gallstone pancreatitis Cholelithiasis and choledocholithiasis with elevated LFTs E. coli bacteremia -Imaging as above.  LFTs improving.  Currently on broad-spectrum antibiotics. BCID positive for E. coli: Follow sensitivities. -Awaiting GI evaluation at [facility].  Keep him NPO.  I have consulted general surgery as well. - Continue IV fluids and analgesics/antiemetics as needed  Leukocytosis  - no labs today.  Monitor  Hyponatremia -Mild.  Continue IV fluids.  Monitor  Prediabetes with hyperglycemia - A1c 5.9.  Continue CBGs with SSI  Persistent A-fib - Rate controlled.  Continue Cardizem .  Eliquis  held.  GERD - Continue Protonix   Obesity class I - Outpatient follow-up  DVT prophylaxis: Eliquis  held.  Start SCDs Code Status: Full Family Communication: Wife at bedside Disposition Plan: Status is: Inpatient Remains inpatient appropriate because: Of severity of illness  Consultants: GI and general surgery  Procedures: None  Antimicrobials:  Zosyn  from 03/18/2024 onwards   Subjective: Patient seen and examined at bedside.  Continues to have intermittent abdominal pain with nausea and hiccups.  No fever, shortness of breath, chest pain reported.  Objective: Vitals:   03/18/24 1555 03/18/24 2107 03/19/24 0516 03/19/24 0807  BP: 110/66 119/77 110/68 112/65  Pulse: 73 83 84 80  Resp: 18 16 16 17   Temp: 97.8 F (36.6 C) 98.9 F (37.2 C) 98.4 F (36.9 C) 98.5 F (36.9 C)  TempSrc:  Oral Oral Oral  SpO2: 94% 95% 95% 96%  Weight:      Height:        Intake/Output Summary (Last 24 hours) at 03/19/2024 0816 Last data filed at 03/19/2024 9192 Gross per 24 hour  Intake 2103.47 ml  Output --  Net 2103.47 ml   Filed Weights   03/17/24 1642  Weight: 109.7 kg    Examination:  General exam: Appears calm and comfortable  Respiratory system: Bilateral decreased breath sounds at bases, no wheezing Cardiovascular system: S1 & S2 heard, Rate controlled Gastrointestinal system: Abdomen is obese, nondistended, soft and mildly tender.  Normal bowel sounds heard. Extremities: No cyanosis, clubbing, edema  Central nervous system: Alert and oriented. No focal neurological deficits. Moving extremities Skin: No rashes, lesions or ulcers Psychiatry: Judgement and insight appear normal. Mood & affect appropriate.     Data Reviewed: I have personally reviewed following labs and imaging studies  CBC: Recent Labs  Lab 03/17/24 1704 03/18/24 0857  WBC 17.7* 24.4*  NEUTROABS 16.2*  --   HGB 18.5* 16.7  HCT 53.5* 49.7  MCV 94.5 95.0  PLT 318 280   Basic Metabolic Panel: Recent Labs  Lab 03/17/24 1704 03/18/24 0857 03/19/24 0400  NA 134* 135 134*  K 4.0 4.4 4.0  CL 103 106 104  CO2 17* 15* 20*  GLUCOSE 220* 153* 111*  BUN 17 18 13   CREATININE 1.03 0.89 1.05  CALCIUM  9.1 8.6* 8.5*   GFR: Estimated Creatinine Clearance: 75.4 mL/min (by C-G formula based on SCr of 1.05 mg/dL). Liver Function Tests: Recent Labs  Lab  03/17/24 1704 03/18/24 0857 03/19/24 0400  AST 247* 290* 101*  ALT 126* 295* 177*  ALKPHOS 91 112 91  BILITOT 2.5* 7.5* 5.0*  PROT 6.9 6.7 5.5*  ALBUMIN  3.9 3.5 2.8*   Recent Labs  Lab 03/17/24 1704  LIPASE 2,089*   No results for input(s): AMMONIA in the last 168 hours. Coagulation Profile: No results for input(s): INR, PROTIME in the last 168 hours. Cardiac Enzymes: No results for input(s): CKTOTAL, CKMB, CKMBINDEX, TROPONINI in the last 168 hours. BNP (last 3 results) Recent Labs    10/16/23 1049  PROBNP 150.0*   HbA1C: Recent Labs    03/17/24 1704  HGBA1C 5.6   CBG: Recent Labs  Lab 03/18/24 0854 03/18/24 1256 03/18/24 1758 03/18/24 2108 03/19/24 0809  GLUCAP 152* 128* 115* 142* 143*   Lipid Profile: Recent Labs    03/18/24 0919  CHOL 148  HDL 39*  LDLCALC 97  TRIG 61  CHOLHDL 3.8   Thyroid  Function Tests: No results for input(s): TSH, T4TOTAL, FREET4, T3FREE, THYROIDAB in the last 72 hours. Anemia Panel: No results for input(s): VITAMINB12, FOLATE, FERRITIN, TIBC, IRON, RETICCTPCT in the last 72 hours. Sepsis Labs: Recent Labs  Lab 03/18/24 0857  PROCALCITON 37.18    Recent Results (from the past 240 hours)  Culture, blood (Routine X 2) w Reflex to ID Panel     Status: None (Preliminary result)   Collection Time: 03/18/24  8:57 AM   Specimen: BLOOD  Result Value Ref Range Status   Specimen Description BLOOD BLOOD LEFT ARM  Final   Special Requests   Final    BOTTLES DRAWN AEROBIC AND ANAEROBIC Blood Culture results may not be optimal due to an inadequate volume of blood received in culture bottles   Culture   Final    NO GROWTH < 24 HOURS Performed at [facility], [address], [place], [place] [address]    Report Status PENDING  Incomplete  Culture, blood (Routine X 2) w Reflex to ID Panel     Status: None (Preliminary result)   Collection Time: 03/18/24  9:12 AM   Specimen: BLOOD  Result Value  Ref Range Status   Specimen Description   Final    BLOOD BLOOD LEFT HAND Performed at [facility], [address], [place], [place] [address]    Special Requests   Final    BOTTLES DRAWN AEROBIC AND ANAEROBIC Blood Culture results may not be optimal due to an inadequate volume of blood received in culture bottles Performed at [facility], [address], [place], [place] [address]    Culture  Setup Time   Final    GRAM NEGATIVE RODS IN BOTH AEROBIC AND ANAEROBIC BOTTLES Gram Stain Report Called to,Read Back By and Verified With: LEVORN LEEDS, RN [facility], [phone], AT 2238 03/18/24 BY A. SNYDER CRITICAL RESULT CALLED TO, READ BACK BY AND VERIFIED WITH: PHARMD G ABBOTT 03/19/2024 @ 0418 BY AB Performed at [facility], [address], [place], [place] [address]    Culture GRAM NEGATIVE RODS  Final   Report Status PENDING  Incomplete  Blood Culture ID Panel (Reflexed)  Status: Abnormal   Collection Time: 03/18/24  9:12 AM  Result Value Ref Range Status   Enterococcus faecalis NOT DETECTED NOT DETECTED Final   Enterococcus Faecium NOT DETECTED NOT DETECTED Final   Listeria monocytogenes NOT DETECTED NOT DETECTED Final   Staphylococcus species NOT DETECTED NOT DETECTED Final   Staphylococcus aureus (BCID) NOT DETECTED NOT DETECTED Final   Staphylococcus epidermidis NOT DETECTED NOT DETECTED Final   Staphylococcus lugdunensis NOT DETECTED NOT DETECTED Final   Streptococcus species NOT DETECTED NOT DETECTED Final   Streptococcus agalactiae NOT DETECTED NOT DETECTED Final   Streptococcus pneumoniae NOT DETECTED NOT DETECTED Final   Streptococcus pyogenes NOT DETECTED NOT DETECTED Final   A.calcoaceticus-baumannii NOT DETECTED NOT DETECTED Final   Bacteroides fragilis NOT DETECTED NOT DETECTED Final   Enterobacterales DETECTED (A) NOT DETECTED Final    Comment: Enterobacterales represent a large order of gram negative bacteria, not a single organism. CRITICAL RESULT CALLED TO,  READ BACK BY AND VERIFIED WITH: PHARMD G ABBOTT 03/19/2024 @ 0418 BY AB    Enterobacter cloacae complex NOT DETECTED NOT DETECTED Final   Escherichia coli DETECTED (A) NOT DETECTED Final    Comment: CRITICAL RESULT CALLED TO, READ BACK BY AND VERIFIED WITH: PHARMD G ABBOTT 03/19/2024 @ 0418 BY AB    Klebsiella aerogenes NOT DETECTED NOT DETECTED Final   Klebsiella oxytoca NOT DETECTED NOT DETECTED Final   Klebsiella pneumoniae NOT DETECTED NOT DETECTED Final   Proteus species NOT DETECTED NOT DETECTED Final   Salmonella species NOT DETECTED NOT DETECTED Final   Serratia marcescens NOT DETECTED NOT DETECTED Final   Haemophilus influenzae NOT DETECTED NOT DETECTED Final   Neisseria meningitidis NOT DETECTED NOT DETECTED Final   Pseudomonas aeruginosa NOT DETECTED NOT DETECTED Final   Stenotrophomonas maltophilia NOT DETECTED NOT DETECTED Final   Candida albicans NOT DETECTED NOT DETECTED Final   Candida auris NOT DETECTED NOT DETECTED Final   Candida glabrata NOT DETECTED NOT DETECTED Final   Candida krusei NOT DETECTED NOT DETECTED Final   Candida parapsilosis NOT DETECTED NOT DETECTED Final   Candida tropicalis NOT DETECTED NOT DETECTED Final   Cryptococcus neoformans/gattii NOT DETECTED NOT DETECTED Final   CTX-M ESBL NOT DETECTED NOT DETECTED Final   Carbapenem resistance IMP NOT DETECTED NOT DETECTED Final   Carbapenem resistance KPC NOT DETECTED NOT DETECTED Final   Carbapenem resistance NDM NOT DETECTED NOT DETECTED Final   Carbapenem resist OXA 48 LIKE NOT DETECTED NOT DETECTED Final   Carbapenem resistance VIM NOT DETECTED NOT DETECTED Final    Comment: Performed at [facility], [address], [place], [place] [address]         Radiology Studies: US  Abdomen Limited RUQ (LIVER/GB) Result Date: 03/18/2024 CLINICAL DATA:  Right upper quadrant pain. EXAM: ULTRASOUND ABDOMEN LIMITED RIGHT UPPER QUADRANT COMPARISON:  None Available. FINDINGS: Gallbladder: A 6.7 mm  gallstone is seen near the neck of the gallbladder. There is no evidence of gallbladder wall thickening (2.2 mm). No sonographic Murphy sign noted by sonographer. Common bile duct: Diameter: 5.3 mm Liver: No focal lesion identified. Diffusely increased echogenicity of the liver parenchyma is noted. Portal vein is patent on color Doppler imaging with normal direction of blood flow towards the liver. Other: None. IMPRESSION: 1. Cholelithiasis without evidence of acute cholecystitis. 2. Hepatic steatosis. Electronically Signed   By: Suzen Dials M.D.   On: 03/18/2024 09:27   MR ABDOMEN MRCP W WO CONTAST Result Date: 03/18/2024 CLINICAL DATA:  Cholelithiasis.  Pancreatitis,  acute, severe. EXAM: MRI ABDOMEN WITHOUT AND WITH CONTRAST (INCLUDING MRCP) TECHNIQUE: Multiplanar multisequence MR imaging of the abdomen was performed both before and after the administration of intravenous contrast. Heavily T2-weighted images of the biliary and pancreatic ducts were obtained, and three-dimensional MRCP images were rendered by post processing. CONTRAST:  10mL GADAVIST  GADOBUTROL  1 MMOL/ML IV SOLN COMPARISON:  CT scan abdomen and pelvis from 03/17/2024. FINDINGS: Lower chest: Unremarkable MR appearance to the lung bases. No pleural effusion. No pericardial effusion. Normal heart size. Hepatobiliary: The liver is normal in size. Noncirrhotic configuration. There is mild diffuse hepatic steatosis. Is there is a 5 mm simple cyst in the left hepatic lobe, segment 2. No suspicious liver lesion. The portal vein and hepatic veins are patent. No intrahepatic or extrahepatic bile duct dilatation. However, there is a single approximately 4 x 7 mm filling defect noted in the midportion of common bile duct (series 11, image 12), which may represent a choledocholithiasis. However, this filling defect is not seen on any other sequences including the MRCP sequences. Gallbladder is physiologically distended. Small volume dependent  gallstones noted. No abnormal gallbladder wall thickening or pericholecystic fat stranding. There is trace amount of fluid in the interface between the gallbladder and liver, likely reactive ascites from pancreatitis. Pancreas: There is slightly heterogeneous and bulky pancreatic body/tail with peripancreatic fat stranding, compatible with acute interstitial pancreatitis. No pancreatic necrosis or peripancreatic/intrapancreatic collections. Main pancreatic duct is not dilated. No suspicious pancreatic lesion. Spleen:  Within normal limits in size and appearance. No focal mass. Adrenals/Urinary Tract: Unremarkable adrenal glands. No hydroureteronephrosis. No suspicious renal mass. There multiple scattered cysts throughout bilateral kidneys with largest in the left kidney lower pole measuring up to 1.3 x 1.3 cm. Stomach/Bowel: Visualized portions within the abdomen are unremarkable. No disproportionate dilation of bowel loops. There multiple diverticula throughout the colon without imaging signs of diverticulitis. Vascular/Lymphatic: No pathologically enlarged lymph nodes identified. No abdominal aortic aneurysm demonstrated. There is trace amount of free fluid in the central upper abdomen as well as mild amount of fat stranding along the mesenteric vessels, favored reactive in the settings of pancreatitis. No walled-off abscess or loculated collection. Other:  There is small fat containing periumbilical hernia. Musculoskeletal: A hemangioma is noted in the right aspect of the L2 vertebral body. IMPRESSION: 1. Acute interstitial pancreatitis. No pancreatic necrosis or peripancreatic/intrapancreatic collections. No main pancreatic duct dilation. No intra or extrahepatic bile duct dilation. There is a probable single 4 x 7 mm choledocholithiasis in the midportion of common bile duct. Small volume cholelithiasis without acute cholecystitis. 2. Multiple other nonacute observations, as described above. Electronically  Signed   By: Ree Molt M.D.   On: 03/18/2024 08:54   MR 3D Recon At Scanner Result Date: 03/18/2024 CLINICAL DATA:  Cholelithiasis.  Pancreatitis, acute, severe. EXAM: MRI ABDOMEN WITHOUT AND WITH CONTRAST (INCLUDING MRCP) TECHNIQUE: Multiplanar multisequence MR imaging of the abdomen was performed both before and after the administration of intravenous contrast. Heavily T2-weighted images of the biliary and pancreatic ducts were obtained, and three-dimensional MRCP images were rendered by post processing. CONTRAST:  10mL GADAVIST  GADOBUTROL  1 MMOL/ML IV SOLN COMPARISON:  CT scan abdomen and pelvis from 03/17/2024. FINDINGS: Lower chest: Unremarkable MR appearance to the lung bases. No pleural effusion. No pericardial effusion. Normal heart size. Hepatobiliary: The liver is normal in size. Noncirrhotic configuration. There is mild diffuse hepatic steatosis. Is there is a 5 mm simple cyst in the left hepatic lobe, segment 2. No suspicious liver lesion.  The portal vein and hepatic veins are patent. No intrahepatic or extrahepatic bile duct dilatation. However, there is a single approximately 4 x 7 mm filling defect noted in the midportion of common bile duct (series 11, image 12), which may represent a choledocholithiasis. However, this filling defect is not seen on any other sequences including the MRCP sequences. Gallbladder is physiologically distended. Small volume dependent gallstones noted. No abnormal gallbladder wall thickening or pericholecystic fat stranding. There is trace amount of fluid in the interface between the gallbladder and liver, likely reactive ascites from pancreatitis. Pancreas: There is slightly heterogeneous and bulky pancreatic body/tail with peripancreatic fat stranding, compatible with acute interstitial pancreatitis. No pancreatic necrosis or peripancreatic/intrapancreatic collections. Main pancreatic duct is not dilated. No suspicious pancreatic lesion. Spleen:  Within normal  limits in size and appearance. No focal mass. Adrenals/Urinary Tract: Unremarkable adrenal glands. No hydroureteronephrosis. No suspicious renal mass. There multiple scattered cysts throughout bilateral kidneys with largest in the left kidney lower pole measuring up to 1.3 x 1.3 cm. Stomach/Bowel: Visualized portions within the abdomen are unremarkable. No disproportionate dilation of bowel loops. There multiple diverticula throughout the colon without imaging signs of diverticulitis. Vascular/Lymphatic: No pathologically enlarged lymph nodes identified. No abdominal aortic aneurysm demonstrated. There is trace amount of free fluid in the central upper abdomen as well as mild amount of fat stranding along the mesenteric vessels, favored reactive in the settings of pancreatitis. No walled-off abscess or loculated collection. Other:  There is small fat containing periumbilical hernia. Musculoskeletal: A hemangioma is noted in the right aspect of the L2 vertebral body. IMPRESSION: 1. Acute interstitial pancreatitis. No pancreatic necrosis or peripancreatic/intrapancreatic collections. No main pancreatic duct dilation. No intra or extrahepatic bile duct dilation. There is a probable single 4 x 7 mm choledocholithiasis in the midportion of common bile duct. Small volume cholelithiasis without acute cholecystitis. 2. Multiple other nonacute observations, as described above. Electronically Signed   By: Ree Molt M.D.   On: 03/18/2024 08:54   CT ABDOMEN PELVIS W CONTRAST Result Date: 03/17/2024 CLINICAL DATA:  Abdominal pain. EXAM: CT ABDOMEN AND PELVIS WITH CONTRAST TECHNIQUE: Multidetector CT imaging of the abdomen and pelvis was performed using the standard protocol following bolus administration of intravenous contrast. RADIATION DOSE REDUCTION: This exam was performed according to the departmental dose-optimization program which includes automated exposure control, adjustment of the mA and/or kV according to  patient size and/or use of iterative reconstruction technique. CONTRAST:  OMNIPAQUE  IOHEXOL  300 MG/ML  SOLN COMPARISON:  12/23/2022. FINDINGS: Lower chest: Dependent bibasilar subsegmental atelectasis or scarring. No pleural or pericardial effusions Hepatobiliary: No hepatic parenchymal abnormalities. Mild central biliary ductal prominence. Gallbladder contains other stones or polyps. Pericholecystic fat stranding. Pancreas: Peripancreatic fat stranding consistent with acute pancreatitis. No discrete collections. No pancreatic parenchymal abnormalities. Spleen: Normal in size without focal abnormality. Adrenals/Urinary Tract: No adrenal lesions. Left kidney left subcentimeter cyst. No hydronephrosis or nephrolithiasis. Unremarkable urinary bladder. Stomach/Bowel: Stomach is within normal limits. Appendix appears normal. No evidence of bowel wall thickening, distention, or inflammatory changes. Diffuse colonic diverticulosis. Vascular/Lymphatic: Aortic atherosclerosis. No enlarged abdominal or pelvic lymph nodes. Reproductive: Prostate is unremarkable. Other: Small periumbilical abdominal wall defect containing omental fat without evidence of incarceration or herniated bowel. No abdominopelvic ascites. Musculoskeletal: Lumbosacral degenerative changes. No acute osseous abnormality. IMPRESSION: 1. Acute pancreatitis. 2. Gallbladder stones or polyps. Pericholecystic fat stranding. 3. Diverticulosis. 4. Small periumbilical abdominal wall defect containing omental fat. 5. Aortic atherosclerosis (ICD10-I70.0). Electronically Signed   By: Fonda  Pleasure M.D.   On: 03/17/2024 21:40        Scheduled Meds:  Chlorhexidine  Gluconate Cloth  6 each Topical Daily   diltiazem   300 mg Oral Daily   insulin  aspart  0-15 Units Subcutaneous TID WC   pantoprazole   40 mg Oral BID   sodium chloride  flush  10-40 mL Intracatheter Q12H   Continuous Infusions:  sodium chloride      piperacillin -tazobactam (ZOSYN )  IV             Sophie Mao, MD [facility] 03/19/2024, 8:16 AM

## 2024-03-19 NOTE — Consult Note (Addendum)
 Cardiology Consultation   Patient ID: ARRAN FESSEL MRN: [id number]; DOB: [birth year]  Admit date: 03/17/2024 Date of Consult: 03/19/2024  PCP:  [name], MD   [facility] Providers Cardiologist:  Jayson Sierras, MD     Patient Profile: Sean Golden is a 78 y.o. male with a hx of chronic pancreatitis, permanent atrial fibrillation, essential hypertension, GERD, sleep apnea, obesity who presented with acute on chronic pancreatitis, cholelithiasis, choledocholithiasis who is being seen 03/19/2024 for the evaluation of pre-operative evaluation prior to laparoscopic cholecystectomy   at the request of Dr. Cheryle.  History of Present Illness: Mr. Sean Golden has past medical history as stated above.  He presented to [facility] emergency department on 03/17/2024 complaining of abdominal pain.  He has a history of chronic pancreatitis.  He reports that the abdominal pain had started the morning prior to arrival with associated nausea and vomiting.  Initial workup in the emergency department included a CT scan that showed pancreatitis, with some stranding around the gallbladder and gallstones.  It was suspected that the patient has gallstone pancreatitis.  He was then transferred to [facility] to be further worked up and treated by GI/general surgery.  Patient is being diagnosed with acute on chronic gallstone pancreatitis, MRCP showed acute interstitial pancreatitis without necrosis with probable choledocholithiasis in the midportion of the common bile duct.  He has been followed and treated by gastroenterology.  General surgery has been consulted for the consideration of a laparoscopic cholecystectomy once acute pancreatitis has resolved.  Cardiology was consulted in the setting of a preop evaluation prior to undergoing laparoscopic cholecystectomy.  Mr. Huezo is a patient of Dr. Sierras.  He had undergone recent nuclear stress test on 10/29/2023.  This showed no ischemia, low  risk study.  There were no EKG changes noted.  LVEF was noted to be 71%.  He was last seen by Lonell Bring, PA-C as an outpatient 12/16/2023.  He was being seen at this appointment for follow-up regarding his dyspnea that is improved since his last visit.  At this visit he noticed that his fatigue was much improved and he had significantly improved endurance.  At this visit he denied any chest pain, edema, syncope.  At this visit it was discussed that his dyspnea was probably multifactorial in the setting of obesity, diastolic dysfunction, permanent A-fib, bronchiectasis and hypertension.  It was noted that his dyspnea has improved significantly with the addition of spironolactone  and titration of diltiazem  to 300 mg daily.  He was continued on the present regimen.  In regards to his permanent atrial fibrillation he was being treated with diltiazem  300 mg daily along with Eliquis  5 mg twice daily.    Blood pressure was noted to be 122/82 at this appointment.  At this appointment it was discussed that he should potentially initiate statin therapy in regards to his cholesterol.  He deferred at this time, he often follows with his PCP for his cholesterol management.  It was encouraged for him to discuss with his PCP and follow-up if he changes his mind about initiating statin therapy.  Past Medical History:  Diagnosis Date   Atrial fibrillation (HCC) 05/2014   Essential hypertension    GERD (gastroesophageal reflux disease)    Helicobacter pylori gastritis 08/2014   Treated with Pylera   Pancreatitis    Sleep apnea    Past Surgical History:  Procedure Laterality Date   CARDIAC ELECTROPHYSIOLOGY STUDY AND ABLATION  2010?   COLONOSCOPY  2005  SOLITARY RECTAL ULCER   COLONOSCOPY N/A 09/06/2014   Dr. Harvey: three colon polyps, moderate diverticulosis throughout the entire examined colon   ESOPHAGOGASTRODUODENOSCOPY N/A 09/06/2014   Dr. Fields:moderate erosive gastritis, no Barrett's. +H.pylori  gastritis, treated with Pylera   ESOPHAGOGASTRODUODENOSCOPY (EGD) WITH PROPOFOL  N/A 11/17/2021   Procedure: ESOPHAGOGASTRODUODENOSCOPY (EGD) WITH PROPOFOL ;  Surgeon: Golda Claudis PENNER, MD;  Location: [facility];  Service: Endoscopy;  Laterality: N/A;   ESOPHAGOGASTRODUODENOSCOPY (EGD) WITH PROPOFOL  N/A 11/27/2021   Procedure: ESOPHAGOGASTRODUODENOSCOPY (EGD) WITH PROPOFOL ;  Surgeon: Eartha Angelia Sieving, MD;  Location: [facility];  Service: Gastroenterology;  Laterality: N/A;   HEMORRHOID BANDING N/A 09/06/2014   NO BANDING PERFORMED. NO INTERNAL HEMORRHOIDS IDENTIFIED    UPPER GASTROINTESTINAL ENDOSCOPY  2005 [facility]   VASECTOMY      Home Medications:  Prior to Admission medications   Medication Sig Start Date End Date Taking? Authorizing Provider  acetaminophen  (TYLENOL ) 325 MG tablet Take 2 tablets (650 mg total) by mouth every 6 (six) hours as needed for mild pain, fever or headache (or Fever >/= 101). Patient taking differently: Take 1,000 mg by mouth every 6 (six) hours as needed for mild pain (pain score 1-3), fever or headache (or Fever >/= 101). 11/12/21  Yes Johnson, Clanford L, MD  apixaban  (ELIQUIS ) 5 MG TABS tablet Take 1 tablet (5 mg total) by mouth 2 (two) times daily. 06/10/23  Yes Miriam Norris, NP  diltiazem  (CARDIZEM  CD) 300 MG 24 hr capsule Take 1 capsule (300 mg total) by mouth daily. 11/14/23  Yes Dunn, Dayna N, PA-C  pantoprazole  (PROTONIX ) 40 MG tablet Take 1 tablet (40 mg total) by mouth 2 (two) times daily. 12/11/22 12/15/96 Yes Carver, Charles K, DO  spironolactone  (ALDACTONE ) 25 MG tablet Take 1 tablet (25 mg total) by mouth daily. 11/04/23 03/18/24 Yes Dunn, Dayna N, PA-C  tamsulosin  (FLOMAX ) 0.4 MG CAPS capsule Take 2 capsules (0.8 mg total) by mouth every evening. 11/12/21  Yes Johnson, Clanford L, MD   Scheduled Meds:  Chlorhexidine  Gluconate Cloth  6 each Topical Daily   diltiazem   300 mg Oral Daily   insulin  aspart  0-15 Units Subcutaneous TID WC   pantoprazole   (PROTONIX ) IV  40 mg Intravenous Q12H   sodium chloride  flush  10-40 mL Intracatheter Q12H   Continuous Infusions:  sodium chloride  125 mL/hr at 03/19/24 0844   chlorproMAZINE  (THORAZINE ) 12.5 mg in sodium chloride  0.9 % 25 mL IVPB 12.5 mg (03/19/24 1209)   piperacillin -tazobactam (ZOSYN )  IV     PRN Meds: chlorproMAZINE  (THORAZINE ) 12.5 mg in sodium chloride  0.9 % 25 mL IVPB, HYDROmorphone  (DILAUDID ) injection, ondansetron  (ZOFRAN ) IV  Allergies:   No Known Allergies  Social History:   Social History   Socioeconomic History   Marital status: Married    Spouse name: Not on file   Number of children: Not on file   Years of education: Not on file   Highest education level: Not on file  Occupational History   Occupation: Full time  Tobacco Use   Smoking status: Former    Current packs/day: 0.00    Average packs/day: 0.5 packs/day for 17.0 years (8.5 ttl pk-yrs)    Types: Cigarettes    Start date: 05/28/1967    Quit date: 05/27/1984    Years since quitting: 39.8    Passive exposure: Past   Smokeless tobacco: Never  Vaping Use   Vaping status: Never Used  Substance and Sexual Activity   Alcohol use: Not Currently  Alcohol/week: 14.0 standard drinks of alcohol    Types: 14 Glasses of wine per week    Comment: hx of 1 or 2 glasses of wine at night; none currently   Drug use: No   Sexual activity: Not Currently  Other Topics Concern   Not on file  Social History Narrative   Married-2 KIDS(AGE 26 AND 38).   No regular exercise   Social Drivers of Health   Financial Resource Strain: Not on file  Food Insecurity: No Food Insecurity (03/18/2024)   Hunger Vital Sign    Worried About Running Out of Food in the Last Year: Never true    Ran Out of Food in the Last Year: Never true  Transportation Needs: No Transportation Needs (03/18/2024)   PRAPARE - Administrator, Civil Service (Medical): No    Lack of Transportation (Non-Medical): No  Physical Activity: Not on  file  Stress: Not on file  Social Connections: Socially Integrated (03/18/2024)   Social Connection and Isolation Panel    Frequency of Communication with Friends and Family: Twice a week    Frequency of Social Gatherings with Friends and Family: Twice a week    Attends Religious Services: 1 to 4 times per year    Active Member of [employer] or Organizations: No    Attends Engineer, structural: 1 to 4 times per year    Marital Status: Married  Catering manager Violence: Not At Risk (03/18/2024)   Humiliation, Afraid, Rape, and Kick questionnaire    Fear of Current or Ex-Partner: No    Emotionally Abused: No    Physically Abused: No    Sexually Abused: No    Family History:   Family History  Problem Relation Age of Onset   Hypertension Mother    Heart attack Father    Hyperlipidemia Sister    Hyperlipidemia Brother    Colon polyps Brother    Hypertension Brother    Hypertension Sister    Diabetes Brother    Stroke Other    Diabetes Other    Hypertension Other    Hyperlipidemia Other    Colon cancer Neg Hx     ROS:  Please see the history of present illness.  All other ROS reviewed and negative.     Physical Exam/Data: Vitals:   03/18/24 1555 03/18/24 2107 03/19/24 0516 03/19/24 0807  BP: 110/66 119/77 110/68 112/65  Pulse: 73 83 84 80  Resp: 18 16 16 17   Temp: 97.8 F (36.6 C) 98.9 F (37.2 C) 98.4 F (36.9 C) 98.5 F (36.9 C)  TempSrc:  Oral Oral Oral  SpO2: 94% 95% 95% 96%  Weight:      Height:       Intake/Output Summary (Last 24 hours) at 03/19/2024 1308 Last data filed at 03/19/2024 0844 Gross per 24 hour  Intake 2444.18 ml  Output --  Net 2444.18 ml      03/17/2024    4:42 PM 12/16/2023    1:54 PM 10/27/2023    2:04 PM  Last 3 Weights  Weight (lbs) 241 lb 13.5 oz 241 lb 12.8 oz 247 lb  Weight (kg) 109.7 kg 109.68 kg 112.038 kg     Body mass index is 34.7 kg/m.   General:  Well nourished, well developed, in no acute distress HEENT:  normal Neck: no JVD Vascular: Distal pulses 2+ bilaterally Cardiac:  normal S1, S2; RRR; no murmur  Lungs:  clear to auscultation bilaterally, no wheezing, rhonchi or rales  Abd: soft, tender to palpation  Ext: no edema Musculoskeletal:  No deformities Skin: warm and dry  Neuro:  no focal abnormalities noted Psych:  Normal affect   Telemetry:  Telemetry was personally reviewed and demonstrates:  N/A  Relevant CV Studies: Nuclear stress test, 10/29/2023   Findings are consistent with no ischemia. The study is low risk.   No ST deviation was noted. The ECG was negative for ischemia.   LV perfusion is normal.  No significant myocardial perfusion defects to indicate scar or ischemia in the setting of diaphragmatic attenuation.   Left ventricular function is normal. Nuclear stress EF: 71%.  Echocardiogram, 10/20/2023 Left ventricular ejection fraction, by estimation, is 60 to 65% . The left ventricle has normal function. The left ventricle has no regional wall motion abnormalities. There is moderate left ventricular hypertrophy. Left ventricular diastolic parameters are indeterminate.  Right ventricular systolic function is normal. The right ventricular size is normal.  Left atrial size was severely dilated.  Right atrial size was moderately dilated.  The mitral valve is normal in structure. Trivial mitral valve regurgitation. No evidence of mitral stenosis.  The aortic valve is tricuspid. Aortic valve regurgitation is not visualized. No aortic stenosis is present.  The inferior vena cava is normal in size with greater than 50% respiratory variability, suggesting right atrial pressure of 3 mmHg.  Laboratory Data: High Sensitivity Troponin:  No results for input(s): TROPONINIHS in the last 720 hours.   Chemistry Recent Labs  Lab 03/17/24 1704 03/18/24 0857 03/19/24 0400  NA 134* 135 134*  K 4.0 4.4 4.0  CL 103 106 104  CO2 17* 15* 20*  GLUCOSE 220* 153* 111*  BUN 17 18 13    CREATININE 1.03 0.89 1.05  CALCIUM  9.1 8.6* 8.5*  GFRNONAA >60 >60 >60  ANIONGAP 14 14 10     Recent Labs  Lab 03/17/24 1704 03/18/24 0857 03/19/24 0400  PROT 6.9 6.7 5.5*  ALBUMIN  3.9 3.5 2.8*  AST 247* 290* 101*  ALT 126* 295* 177*  ALKPHOS 91 112 91  BILITOT 2.5* 7.5* 5.0*   Lipids  Recent Labs  Lab 03/18/24 0919  CHOL 148  TRIG 61  HDL 39*  LDLCALC 97  CHOLHDL 3.8    Hematology Recent Labs  Lab 03/17/24 1704 03/18/24 0857 03/19/24 1100  WBC 17.7* 24.4* 16.4*  RBC 5.66 5.23 4.47  HGB 18.5* 16.7 14.3  HCT 53.5* 49.7 42.8  MCV 94.5 95.0 95.7  MCH 32.7 31.9 32.0  MCHC 34.6 33.6 33.4  RDW 12.5 12.9 13.2  PLT 318 280 223   Thyroid  No results for input(s): TSH, FREET4 in the last 168 hours.  BNPNo results for input(s): BNP, PROBNP in the last 168 hours.  DDimer No results for input(s): DDIMER in the last 168 hours.  Radiology/Studies:  US  Abdomen Limited RUQ (LIVER/GB) Result Date: 03/18/2024 CLINICAL DATA:  Right upper quadrant pain. EXAM: ULTRASOUND ABDOMEN LIMITED RIGHT UPPER QUADRANT COMPARISON:  None Available. FINDINGS: Gallbladder: A 6.7 mm gallstone is seen near the neck of the gallbladder. There is no evidence of gallbladder wall thickening (2.2 mm). No sonographic Murphy sign noted by sonographer. Common bile duct: Diameter: 5.3 mm Liver: No focal lesion identified. Diffusely increased echogenicity of the liver parenchyma is noted. Portal vein is patent on color Doppler imaging with normal direction of blood flow towards the liver. Other: None. IMPRESSION: 1. Cholelithiasis without evidence of acute cholecystitis. 2. Hepatic steatosis. Electronically Signed   By: Suzen Dials M.D.   On:  03/18/2024 09:27   MR ABDOMEN MRCP W WO CONTAST Result Date: 03/18/2024 CLINICAL DATA:  Cholelithiasis.  Pancreatitis, acute, severe. EXAM: MRI ABDOMEN WITHOUT AND WITH CONTRAST (INCLUDING MRCP) TECHNIQUE: Multiplanar multisequence MR imaging of the abdomen  was performed both before and after the administration of intravenous contrast. Heavily T2-weighted images of the biliary and pancreatic ducts were obtained, and three-dimensional MRCP images were rendered by post processing. CONTRAST:  10mL GADAVIST  GADOBUTROL  1 MMOL/ML IV SOLN COMPARISON:  CT scan abdomen and pelvis from 03/17/2024. FINDINGS: Lower chest: Unremarkable MR appearance to the lung bases. No pleural effusion. No pericardial effusion. Normal heart size. Hepatobiliary: The liver is normal in size. Noncirrhotic configuration. There is mild diffuse hepatic steatosis. Is there is a 5 mm simple cyst in the left hepatic lobe, segment 2. No suspicious liver lesion. The portal vein and hepatic veins are patent. No intrahepatic or extrahepatic bile duct dilatation. However, there is a single approximately 4 x 7 mm filling defect noted in the midportion of common bile duct (series 11, image 12), which may represent a choledocholithiasis. However, this filling defect is not seen on any other sequences including the MRCP sequences. Gallbladder is physiologically distended. Small volume dependent gallstones noted. No abnormal gallbladder wall thickening or pericholecystic fat stranding. There is trace amount of fluid in the interface between the gallbladder and liver, likely reactive ascites from pancreatitis. Pancreas: There is slightly heterogeneous and bulky pancreatic body/tail with peripancreatic fat stranding, compatible with acute interstitial pancreatitis. No pancreatic necrosis or peripancreatic/intrapancreatic collections. Main pancreatic duct is not dilated. No suspicious pancreatic lesion. Spleen:  Within normal limits in size and appearance. No focal mass. Adrenals/Urinary Tract: Unremarkable adrenal glands. No hydroureteronephrosis. No suspicious renal mass. There multiple scattered cysts throughout bilateral kidneys with largest in the left kidney lower pole measuring up to 1.3 x 1.3 cm.  Stomach/Bowel: Visualized portions within the abdomen are unremarkable. No disproportionate dilation of bowel loops. There multiple diverticula throughout the colon without imaging signs of diverticulitis. Vascular/Lymphatic: No pathologically enlarged lymph nodes identified. No abdominal aortic aneurysm demonstrated. There is trace amount of free fluid in the central upper abdomen as well as mild amount of fat stranding along the mesenteric vessels, favored reactive in the settings of pancreatitis. No walled-off abscess or loculated collection. Other:  There is small fat containing periumbilical hernia. Musculoskeletal: A hemangioma is noted in the right aspect of the L2 vertebral body. IMPRESSION: 1. Acute interstitial pancreatitis. No pancreatic necrosis or peripancreatic/intrapancreatic collections. No main pancreatic duct dilation. No intra or extrahepatic bile duct dilation. There is a probable single 4 x 7 mm choledocholithiasis in the midportion of common bile duct. Small volume cholelithiasis without acute cholecystitis. 2. Multiple other nonacute observations, as described above. Electronically Signed   By: Ree Molt M.D.   On: 03/18/2024 08:54   MR 3D Recon At Scanner Result Date: 03/18/2024 CLINICAL DATA:  Cholelithiasis.  Pancreatitis, acute, severe. EXAM: MRI ABDOMEN WITHOUT AND WITH CONTRAST (INCLUDING MRCP) TECHNIQUE: Multiplanar multisequence MR imaging of the abdomen was performed both before and after the administration of intravenous contrast. Heavily T2-weighted images of the biliary and pancreatic ducts were obtained, and three-dimensional MRCP images were rendered by post processing. CONTRAST:  10mL GADAVIST  GADOBUTROL  1 MMOL/ML IV SOLN COMPARISON:  CT scan abdomen and pelvis from 03/17/2024. FINDINGS: Lower chest: Unremarkable MR appearance to the lung bases. No pleural effusion. No pericardial effusion. Normal heart size. Hepatobiliary: The liver is normal in size. Noncirrhotic  configuration. There is mild diffuse hepatic  steatosis. Is there is a 5 mm simple cyst in the left hepatic lobe, segment 2. No suspicious liver lesion. The portal vein and hepatic veins are patent. No intrahepatic or extrahepatic bile duct dilatation. However, there is a single approximately 4 x 7 mm filling defect noted in the midportion of common bile duct (series 11, image 12), which may represent a choledocholithiasis. However, this filling defect is not seen on any other sequences including the MRCP sequences. Gallbladder is physiologically distended. Small volume dependent gallstones noted. No abnormal gallbladder wall thickening or pericholecystic fat stranding. There is trace amount of fluid in the interface between the gallbladder and liver, likely reactive ascites from pancreatitis. Pancreas: There is slightly heterogeneous and bulky pancreatic body/tail with peripancreatic fat stranding, compatible with acute interstitial pancreatitis. No pancreatic necrosis or peripancreatic/intrapancreatic collections. Main pancreatic duct is not dilated. No suspicious pancreatic lesion. Spleen:  Within normal limits in size and appearance. No focal mass. Adrenals/Urinary Tract: Unremarkable adrenal glands. No hydroureteronephrosis. No suspicious renal mass. There multiple scattered cysts throughout bilateral kidneys with largest in the left kidney lower pole measuring up to 1.3 x 1.3 cm. Stomach/Bowel: Visualized portions within the abdomen are unremarkable. No disproportionate dilation of bowel loops. There multiple diverticula throughout the colon without imaging signs of diverticulitis. Vascular/Lymphatic: No pathologically enlarged lymph nodes identified. No abdominal aortic aneurysm demonstrated. There is trace amount of free fluid in the central upper abdomen as well as mild amount of fat stranding along the mesenteric vessels, favored reactive in the settings of pancreatitis. No walled-off abscess or loculated  collection. Other:  There is small fat containing periumbilical hernia. Musculoskeletal: A hemangioma is noted in the right aspect of the L2 vertebral body. IMPRESSION: 1. Acute interstitial pancreatitis. No pancreatic necrosis or peripancreatic/intrapancreatic collections. No main pancreatic duct dilation. No intra or extrahepatic bile duct dilation. There is a probable single 4 x 7 mm choledocholithiasis in the midportion of common bile duct. Small volume cholelithiasis without acute cholecystitis. 2. Multiple other nonacute observations, as described above. Electronically Signed   By: Ree Molt M.D.   On: 03/18/2024 08:54   CT ABDOMEN PELVIS W CONTRAST Result Date: 03/17/2024 CLINICAL DATA:  Abdominal pain. EXAM: CT ABDOMEN AND PELVIS WITH CONTRAST TECHNIQUE: Multidetector CT imaging of the abdomen and pelvis was performed using the standard protocol following bolus administration of intravenous contrast. RADIATION DOSE REDUCTION: This exam was performed according to the departmental dose-optimization program which includes automated exposure control, adjustment of the mA and/or kV according to patient size and/or use of iterative reconstruction technique. CONTRAST:  OMNIPAQUE  IOHEXOL  300 MG/ML  SOLN COMPARISON:  12/23/2022. FINDINGS: Lower chest: Dependent bibasilar subsegmental atelectasis or scarring. No pleural or pericardial effusions Hepatobiliary: No hepatic parenchymal abnormalities. Mild central biliary ductal prominence. Gallbladder contains other stones or polyps. Pericholecystic fat stranding. Pancreas: Peripancreatic fat stranding consistent with acute pancreatitis. No discrete collections. No pancreatic parenchymal abnormalities. Spleen: Normal in size without focal abnormality. Adrenals/Urinary Tract: No adrenal lesions. Left kidney left subcentimeter cyst. No hydronephrosis or nephrolithiasis. Unremarkable urinary bladder. Stomach/Bowel: Stomach is within normal limits. Appendix  appears normal. No evidence of bowel wall thickening, distention, or inflammatory changes. Diffuse colonic diverticulosis. Vascular/Lymphatic: Aortic atherosclerosis. No enlarged abdominal or pelvic lymph nodes. Reproductive: Prostate is unremarkable. Other: Small periumbilical abdominal wall defect containing omental fat without evidence of incarceration or herniated bowel. No abdominopelvic ascites. Musculoskeletal: Lumbosacral degenerative changes. No acute osseous abnormality. IMPRESSION: 1. Acute pancreatitis. 2. Gallbladder stones or polyps. Pericholecystic fat stranding. 3. Diverticulosis.  4. Small periumbilical abdominal wall defect containing omental fat. 5. Aortic atherosclerosis (ICD10-I70.0). Electronically Signed   By: Fonda Field M.D.   On: 03/17/2024 21:40   Assessment and Plan: Preoperative cardiac evaluation  Upcoming laparoscopic cholecystectomy Acute on chronic gallstone pancreatitis Recently underwent stress test 10/2023 that showed no ischemia, low restudy No active cardiac symptoms (chest pain, dyspnea, syncope, palpitations) No history of recent ACS, decompensated heart failure, significant arrhythmias or severe valvular disease Stable functional status: able to perform > 4 METs without any new symptoms RCRI is 1, indicating perioperative risk of major cardiac event is 0.9% Reports some dyspnea that has been chronic. He experienced this prior to have his ischemic workup in 10/2023 which was normal  Patient would be considered low risk from a cardiac standpoint prior to surgery No additional cardiac testing is required prior to surgery If new symptoms develop, reassess as needed  Permanent atrial fibrillation  Home meds: Diltiazem  300 mg daily, Eliquis  5 mg BID Patient does not have EKG from this admission nor is he on telemetry therefore cannot evaluate current rhythm  Remaining rate controlled Continue diltiazem  300 mg daily  Resume Eliquis  when approved by  surgery  Hypertension  Home meds: Diltiazem  300 mg daily, spironolactone  25 mg daily Most recent BP 112/65 Currently on diltiazem  300 mg daily   Per primary Acute on chronic gallstone pancreatitis E coli bacteremia Leukocytosis Hyponatremia Prediabetes GERD Obesity OSA on CPAP  Risk Assessment/Risk Scores:     CHA2DS2-VASc Score = 3   This indicates a 3.2% annual risk of stroke. The patient's score is based upon: CHF History: 0 HTN History: 1 Diabetes History: 0 Stroke History: 0 Vascular Disease History: 0 Age Score: 2 Gender Score: 0        For questions or updates, please contact [facility] Please consult [email or web address] for contact info under    Signed, Waddell DELENA Donath, PA-C  03/19/2024 1:08 PM  Agree with note by Waddell Donath, PA-C  Asked to see this 78 year old mildly overweight married Caucasian male patient of Dr. Madalyn for preoperative clearance before cholecystectomy.  He is admitted with gallstone pancreatitis.  He does have a history of persistent A-fib on Eliquis  as well as treated hypertension.  Never had a heart attack or stroke.  He had a fairly normal 2D echocardiogram this past January with LVH and a nonischemic Myoview  in February.  He denies chest pain or shortness of breath.  His exam is benign except for some mild abdominal tenderness to palpation.  He is a low risk to undergo cholecystectomy from a cardiac point of view.  Dorn DOROTHA Lesches, M.D., FACP, [facility], FAHA, [facility]  [address] [place], [place]  [address]  [phone] 03/19/2024 1:16 PM

## 2024-03-19 NOTE — Plan of Care (Signed)

## 2024-03-19 NOTE — Consult Note (Signed)
 Consultation  Referring Provider:  [facility]  Primary Care Physician:  [name], MD Primary Gastroenterologist:  Dr. Cindie Reason for Consultation:    Gallstone pancreatitis   LOS: 1 day          HPI:   Sean Golden is a 78 y.o. male with past medical history significant for medical history significant of chronic pancreatitis, persistent atrial fibrillation, essential hypertension, GERD, sleep apnea, obesity, presents for evaluation of acute on chronic pancreatitis.  Originally presented to [facility] ED with abdominal pain in upper abdomen radiating to back. Found to have WBC 17.7, AST 247, ALT 126, Total bilirubin 2.5, lipase 2089.  CTAP W contrast showed acute pancreatitis, gallbladder stones/ polyps. MRCP showed acute interstitial pancreatitis withotu necrosis with probably choledocholithiasis in midportion of CBD.  He was then transferred to [facility] RUQ US  showed gallstones and normal CBD of 5.4mm  Additionally patient was found to have e coli bacteremia and started on zosyn   Past Medical History:  Diagnosis Date   Atrial fibrillation (HCC) 05/2014   Essential hypertension    GERD (gastroesophageal reflux disease)    Helicobacter pylori gastritis 08/2014   Treated with Pylera   Pancreatitis    Sleep apnea     Surgical History:  He  has a past surgical history that includes Colonoscopy (2005); Cardiac electrophysiology study and ablation (2010?); Upper gastrointestinal endoscopy (2005 [facility]); Colonoscopy (N/A, 09/06/2014); Esophagogastroduodenoscopy (N/A, 09/06/2014); Hemorrhoid banding (N/A, 09/06/2014); Vasectomy; Esophagogastroduodenoscopy (egd) with propofol  (N/A, 11/17/2021); and Esophagogastroduodenoscopy (egd) with propofol  (N/A, 11/27/2021). Family History:  His family history includes Colon polyps in his brother; Diabetes in his brother and another family member; Heart attack in his father; Hyperlipidemia in his brother, sister, and another family member; Hypertension  in his brother, mother, sister, and another family member; Stroke in an other family member. Social History:   reports that he quit smoking about 39 years ago. His smoking use included cigarettes. He started smoking about 56 years ago. He has a 8.5 pack-year smoking history. He has been exposed to tobacco smoke. He has never used smokeless tobacco. He reports that he does not currently use alcohol after a past usage of about 14.0 standard drinks of alcohol per week. He reports that he does not use drugs.  Prior to Admission medications   Medication Sig Start Date End Date Taking? Authorizing Provider  acetaminophen  (TYLENOL ) 325 MG tablet Take 2 tablets (650 mg total) by mouth every 6 (six) hours as needed for mild pain, fever or headache (or Fever >/= 101). Patient taking differently: Take 1,000 mg by mouth every 6 (six) hours as needed for mild pain (pain score 1-3), fever or headache (or Fever >/= 101). 11/12/21  Yes Johnson, Clanford L, MD  apixaban  (ELIQUIS ) 5 MG TABS tablet Take 1 tablet (5 mg total) by mouth 2 (two) times daily. 06/10/23  Yes Miriam Norris, NP  diltiazem  (CARDIZEM  CD) 300 MG 24 hr capsule Take 1 capsule (300 mg total) by mouth daily. 11/14/23  Yes Dunn, Dayna N, PA-C  pantoprazole  (PROTONIX ) 40 MG tablet Take 1 tablet (40 mg total) by mouth 2 (two) times daily. 12/11/22 12/15/96 Yes Carver, Carlin POUR, DO  spironolactone  (ALDACTONE ) 25 MG tablet Take 1 tablet (25 mg total) by mouth daily. 11/04/23 03/18/24 Yes Dunn, Dayna N, PA-C  tamsulosin  (FLOMAX ) 0.4 MG CAPS capsule Take 2 capsules (0.8 mg total) by mouth every evening. 11/12/21  Yes Vicci Afton CROME, MD    Current Facility-Administered  Medications  Medication Dose Route Frequency Provider Last Rate Last Admin   0.9 %  sodium chloride  infusion   Intravenous Continuous Cheryle Page, MD 125 mL/hr at 03/19/24 0844 New Bag at 03/19/24 0844   Chlorhexidine  Gluconate Cloth 2 % PADS 6 each  6 each Topical Daily Tat, David, MD   6  each at 03/19/24 0846   chlorproMAZINE  (THORAZINE ) 12.5 mg in sodium chloride  0.9 % 25 mL IVPB  12.5 mg Intravenous Q6H PRN Cheryle Page, MD       diltiazem  (CARDIZEM  CD) 24 hr capsule 300 mg  300 mg Oral Daily Adefeso, Oladapo, DO   300 mg at 03/19/24 0846   HYDROmorphone  (DILAUDID ) injection 0.5 mg  0.5 mg Intravenous Q3H PRN Adefeso, Oladapo, DO   0.5 mg at 03/18/24 2300   insulin  aspart (novoLOG ) injection 0-15 Units  0-15 Units Subcutaneous TID WC Adefeso, Oladapo, DO   2 Units at 03/19/24 0847   ondansetron  (ZOFRAN ) injection 4 mg  4 mg Intravenous Q6H PRN Alekh, Kshitiz, MD       pantoprazole  (PROTONIX ) injection 40 mg  40 mg Intravenous Q12H Alekh, Kshitiz, MD       piperacillin -tazobactam (ZOSYN ) IVPB 3.375 g  3.375 g Intravenous Q8H Alekh, Kshitiz, MD       sodium chloride  flush (NS) 0.9 % injection 10-40 mL  10-40 mL Intracatheter Q12H Tat, David, MD   10 mL at 03/19/24 0847    Allergies as of 03/17/2024   (No Known Allergies)    Review of Systems  Constitutional:  Negative for chills, fever and weight loss.  HENT:  Negative for hearing loss and tinnitus.   Eyes:  Negative for blurred vision and double vision.  Respiratory:  Negative for cough and hemoptysis.   Cardiovascular:  Negative for chest pain and palpitations.  Gastrointestinal:  Positive for abdominal pain. Negative for blood in stool, constipation, heartburn, melena, nausea and vomiting.  Genitourinary:  Negative for dysuria and urgency.  Musculoskeletal:  Negative for myalgias and neck pain.  Skin:  Negative for itching and rash.  Neurological:  Negative for seizures and loss of consciousness.  Psychiatric/Behavioral:  Negative for depression and suicidal ideas.        Physical Exam:  Vital signs in last 24 hours: Temp:  [97.8 F (36.6 C)-98.9 F (37.2 C)] 98.5 F (36.9 C) (06/27 0807) Pulse Rate:  [62-84] 80 (06/27 0807) Resp:  [16-18] 17 (06/27 0807) BP: (106-126)/(65-80) 112/65 (06/27 0807) SpO2:   [90 %-96 %] 96 % (06/27 0807) Last BM Date : 03/19/24 Last BM recorded by nurses in past 5 days Stool Type: Type 1 (Separate hard lumps) (03/19/2024  8:07 AM)  Physical Exam Constitutional:      Appearance: He is not ill-appearing.  HENT:     Head: Normocephalic and atraumatic.     Mouth/Throat:     Mouth: Mucous membranes are moist.     Pharynx: No oropharyngeal exudate.   Eyes:     Extraocular Movements: Extraocular movements intact.     Conjunctiva/sclera: Conjunctivae normal.    Cardiovascular:     Rate and Rhythm: Normal rate and regular rhythm.  Pulmonary:     Effort: Pulmonary effort is normal. No respiratory distress.  Abdominal:     General: There is no distension.     Palpations: Abdomen is soft. There is no mass.     Tenderness: There is abdominal tenderness. There is no guarding or rebound.     Hernia: No hernia is present.  Musculoskeletal:        General: No swelling. Normal range of motion.     Cervical back: Normal range of motion and neck supple.   Skin:    General: Skin is warm and dry.     Coloration: Skin is not jaundiced.   Neurological:     General: No focal deficit present.     Mental Status: He is alert and oriented to person, place, and time.   Psychiatric:        Mood and Affect: Mood normal.        Behavior: Behavior normal.        Thought Content: Thought content normal.        Judgment: Judgment normal.      LAB RESULTS: Recent Labs    03/17/24 1704 03/18/24 0857  WBC 17.7* 24.4*  HGB 18.5* 16.7  HCT 53.5* 49.7  PLT 318 280   BMET Recent Labs    03/17/24 1704 03/18/24 0857 03/19/24 0400  NA 134* 135 134*  K 4.0 4.4 4.0  CL 103 106 104  CO2 17* 15* 20*  GLUCOSE 220* 153* 111*  BUN 17 18 13   CREATININE 1.03 0.89 1.05  CALCIUM  9.1 8.6* 8.5*   LFT Recent Labs    03/19/24 0400  PROT 5.5*  ALBUMIN  2.8*  AST 101*  ALT 177*  ALKPHOS 91  BILITOT 5.0*   PT/INR No results for input(s): LABPROT, INR in the last  72 hours.  STUDIES: US  Abdomen Limited RUQ (LIVER/GB) Result Date: 03/18/2024 CLINICAL DATA:  Right upper quadrant pain. EXAM: ULTRASOUND ABDOMEN LIMITED RIGHT UPPER QUADRANT COMPARISON:  None Available. FINDINGS: Gallbladder: A 6.7 mm gallstone is seen near the neck of the gallbladder. There is no evidence of gallbladder wall thickening (2.2 mm). No sonographic Murphy sign noted by sonographer. Common bile duct: Diameter: 5.3 mm Liver: No focal lesion identified. Diffusely increased echogenicity of the liver parenchyma is noted. Portal vein is patent on color Doppler imaging with normal direction of blood flow towards the liver. Other: None. IMPRESSION: 1. Cholelithiasis without evidence of acute cholecystitis. 2. Hepatic steatosis. Electronically Signed   By: Suzen Dials M.D.   On: 03/18/2024 09:27   MR ABDOMEN MRCP W WO CONTAST Result Date: 03/18/2024 CLINICAL DATA:  Cholelithiasis.  Pancreatitis, acute, severe. EXAM: MRI ABDOMEN WITHOUT AND WITH CONTRAST (INCLUDING MRCP) TECHNIQUE: Multiplanar multisequence MR imaging of the abdomen was performed both before and after the administration of intravenous contrast. Heavily T2-weighted images of the biliary and pancreatic ducts were obtained, and three-dimensional MRCP images were rendered by post processing. CONTRAST:  10mL GADAVIST  GADOBUTROL  1 MMOL/ML IV SOLN COMPARISON:  CT scan abdomen and pelvis from 03/17/2024. FINDINGS: Lower chest: Unremarkable MR appearance to the lung bases. No pleural effusion. No pericardial effusion. Normal heart size. Hepatobiliary: The liver is normal in size. Noncirrhotic configuration. There is mild diffuse hepatic steatosis. Is there is a 5 mm simple cyst in the left hepatic lobe, segment 2. No suspicious liver lesion. The portal vein and hepatic veins are patent. No intrahepatic or extrahepatic bile duct dilatation. However, there is a single approximately 4 x 7 mm filling defect noted in the midportion of common bile  duct (series 11, image 12), which may represent a choledocholithiasis. However, this filling defect is not seen on any other sequences including the MRCP sequences. Gallbladder is physiologically distended. Small volume dependent gallstones noted. No abnormal gallbladder wall thickening or pericholecystic fat stranding. There is trace amount of fluid in the  interface between the gallbladder and liver, likely reactive ascites from pancreatitis. Pancreas: There is slightly heterogeneous and bulky pancreatic body/tail with peripancreatic fat stranding, compatible with acute interstitial pancreatitis. No pancreatic necrosis or peripancreatic/intrapancreatic collections. Main pancreatic duct is not dilated. No suspicious pancreatic lesion. Spleen:  Within normal limits in size and appearance. No focal mass. Adrenals/Urinary Tract: Unremarkable adrenal glands. No hydroureteronephrosis. No suspicious renal mass. There multiple scattered cysts throughout bilateral kidneys with largest in the left kidney lower pole measuring up to 1.3 x 1.3 cm. Stomach/Bowel: Visualized portions within the abdomen are unremarkable. No disproportionate dilation of bowel loops. There multiple diverticula throughout the colon without imaging signs of diverticulitis. Vascular/Lymphatic: No pathologically enlarged lymph nodes identified. No abdominal aortic aneurysm demonstrated. There is trace amount of free fluid in the central upper abdomen as well as mild amount of fat stranding along the mesenteric vessels, favored reactive in the settings of pancreatitis. No walled-off abscess or loculated collection. Other:  There is small fat containing periumbilical hernia. Musculoskeletal: A hemangioma is noted in the right aspect of the L2 vertebral body. IMPRESSION: 1. Acute interstitial pancreatitis. No pancreatic necrosis or peripancreatic/intrapancreatic collections. No main pancreatic duct dilation. No intra or extrahepatic bile duct dilation.  There is a probable single 4 x 7 mm choledocholithiasis in the midportion of common bile duct. Small volume cholelithiasis without acute cholecystitis. 2. Multiple other nonacute observations, as described above. Electronically Signed   By: Ree Molt M.D.   On: 03/18/2024 08:54   MR 3D Recon At Scanner Result Date: 03/18/2024 CLINICAL DATA:  Cholelithiasis.  Pancreatitis, acute, severe. EXAM: MRI ABDOMEN WITHOUT AND WITH CONTRAST (INCLUDING MRCP) TECHNIQUE: Multiplanar multisequence MR imaging of the abdomen was performed both before and after the administration of intravenous contrast. Heavily T2-weighted images of the biliary and pancreatic ducts were obtained, and three-dimensional MRCP images were rendered by post processing. CONTRAST:  10mL GADAVIST  GADOBUTROL  1 MMOL/ML IV SOLN COMPARISON:  CT scan abdomen and pelvis from 03/17/2024. FINDINGS: Lower chest: Unremarkable MR appearance to the lung bases. No pleural effusion. No pericardial effusion. Normal heart size. Hepatobiliary: The liver is normal in size. Noncirrhotic configuration. There is mild diffuse hepatic steatosis. Is there is a 5 mm simple cyst in the left hepatic lobe, segment 2. No suspicious liver lesion. The portal vein and hepatic veins are patent. No intrahepatic or extrahepatic bile duct dilatation. However, there is a single approximately 4 x 7 mm filling defect noted in the midportion of common bile duct (series 11, image 12), which may represent a choledocholithiasis. However, this filling defect is not seen on any other sequences including the MRCP sequences. Gallbladder is physiologically distended. Small volume dependent gallstones noted. No abnormal gallbladder wall thickening or pericholecystic fat stranding. There is trace amount of fluid in the interface between the gallbladder and liver, likely reactive ascites from pancreatitis. Pancreas: There is slightly heterogeneous and bulky pancreatic body/tail with peripancreatic  fat stranding, compatible with acute interstitial pancreatitis. No pancreatic necrosis or peripancreatic/intrapancreatic collections. Main pancreatic duct is not dilated. No suspicious pancreatic lesion. Spleen:  Within normal limits in size and appearance. No focal mass. Adrenals/Urinary Tract: Unremarkable adrenal glands. No hydroureteronephrosis. No suspicious renal mass. There multiple scattered cysts throughout bilateral kidneys with largest in the left kidney lower pole measuring up to 1.3 x 1.3 cm. Stomach/Bowel: Visualized portions within the abdomen are unremarkable. No disproportionate dilation of bowel loops. There multiple diverticula throughout the colon without imaging signs of diverticulitis. Vascular/Lymphatic: No pathologically enlarged lymph nodes  identified. No abdominal aortic aneurysm demonstrated. There is trace amount of free fluid in the central upper abdomen as well as mild amount of fat stranding along the mesenteric vessels, favored reactive in the settings of pancreatitis. No walled-off abscess or loculated collection. Other:  There is small fat containing periumbilical hernia. Musculoskeletal: A hemangioma is noted in the right aspect of the L2 vertebral body. IMPRESSION: 1. Acute interstitial pancreatitis. No pancreatic necrosis or peripancreatic/intrapancreatic collections. No main pancreatic duct dilation. No intra or extrahepatic bile duct dilation. There is a probable single 4 x 7 mm choledocholithiasis in the midportion of common bile duct. Small volume cholelithiasis without acute cholecystitis. 2. Multiple other nonacute observations, as described above. Electronically Signed   By: Ree Molt M.D.   On: 03/18/2024 08:54   CT ABDOMEN PELVIS W CONTRAST Result Date: 03/17/2024 CLINICAL DATA:  Abdominal pain. EXAM: CT ABDOMEN AND PELVIS WITH CONTRAST TECHNIQUE: Multidetector CT imaging of the abdomen and pelvis was performed using the standard protocol following bolus  administration of intravenous contrast. RADIATION DOSE REDUCTION: This exam was performed according to the departmental dose-optimization program which includes automated exposure control, adjustment of the mA and/or kV according to patient size and/or use of iterative reconstruction technique. CONTRAST:  OMNIPAQUE  IOHEXOL  300 MG/ML  SOLN COMPARISON:  12/23/2022. FINDINGS: Lower chest: Dependent bibasilar subsegmental atelectasis or scarring. No pleural or pericardial effusions Hepatobiliary: No hepatic parenchymal abnormalities. Mild central biliary ductal prominence. Gallbladder contains other stones or polyps. Pericholecystic fat stranding. Pancreas: Peripancreatic fat stranding consistent with acute pancreatitis. No discrete collections. No pancreatic parenchymal abnormalities. Spleen: Normal in size without focal abnormality. Adrenals/Urinary Tract: No adrenal lesions. Left kidney left subcentimeter cyst. No hydronephrosis or nephrolithiasis. Unremarkable urinary bladder. Stomach/Bowel: Stomach is within normal limits. Appendix appears normal. No evidence of bowel wall thickening, distention, or inflammatory changes. Diffuse colonic diverticulosis. Vascular/Lymphatic: Aortic atherosclerosis. No enlarged abdominal or pelvic lymph nodes. Reproductive: Prostate is unremarkable. Other: Small periumbilical abdominal wall defect containing omental fat without evidence of incarceration or herniated bowel. No abdominopelvic ascites. Musculoskeletal: Lumbosacral degenerative changes. No acute osseous abnormality. IMPRESSION: 1. Acute pancreatitis. 2. Gallbladder stones or polyps. Pericholecystic fat stranding. 3. Diverticulosis. 4. Small periumbilical abdominal wall defect containing omental fat. 5. Aortic atherosclerosis (ICD10-I70.0). Electronically Signed   By: Fonda Field M.D.   On: 03/17/2024 21:40      Impression/Plan   Acute on chronic gallstone pancreatitis Elevated LFTs MRCP showed acute  interstitial pancreatitis without necrosis with probably choledocholithiasis in midportion of CBD.  RUQ US  showed gallstones and normal CBD of 5.36mm LFTs improving -- recheck labs today and at 5pm. If not significantly improved consider ERCP tomorrow/Sunday -- surgical consult for consideration of cholecystectomy with IOC -- Fluid replacement lactated ringers  125-150cc/hr (3 cc/kg/hr) can decrease after 12-24 hours to 100-125cc/hr. (prevents ATN/necrosis) --Watch for hypervolemia --Pain control per the inpatient medical team. --Encourage early ambulation --Advance diet as tolerated.  --If the patient not tolerating an oral diet, would recommend consideration of nutritional support with a nasogastric or nasojejunal postpyloric feedings. Monitor ileus. --Repeat CT scan if concern for necrotizing pancreatitis, pseudocyst, or abscess or no improvement in 48 hours- consider IR or surgical consult.  --Trend CBC, CMP, lipase. --Will follow along.  E coli bacteremia Leukocytosis Needs labs today - continue zosyn   Thank you for your kind consultation, we will continue to follow.   Twala Collings CHRISTELLA Blower  03/19/2024, 10:09 AM

## 2024-03-20 DIAGNOSIS — K861 Other chronic pancreatitis: Secondary | ICD-10-CM | POA: Diagnosis not present

## 2024-03-20 DIAGNOSIS — K859 Acute pancreatitis without necrosis or infection, unspecified: Secondary | ICD-10-CM | POA: Diagnosis not present

## 2024-03-20 DIAGNOSIS — K8050 Calculus of bile duct without cholangitis or cholecystitis without obstruction: Secondary | ICD-10-CM

## 2024-03-20 LAB — COMPREHENSIVE METABOLIC PANEL WITH GFR
ALT: 101 U/L — ABNORMAL HIGH (ref 0–44)
AST: 35 U/L (ref 15–41)
Albumin: 2.6 g/dL — ABNORMAL LOW (ref 3.5–5.0)
Alkaline Phosphatase: 92 U/L (ref 38–126)
Anion gap: 10 (ref 5–15)
BUN: 9 mg/dL (ref 8–23)
CO2: 18 mmol/L — ABNORMAL LOW (ref 22–32)
Calcium: 8.3 mg/dL — ABNORMAL LOW (ref 8.9–10.3)
Chloride: 107 mmol/L (ref 98–111)
Creatinine, Ser: 0.89 mg/dL (ref 0.61–1.24)
GFR, Estimated: >60 mL/min (ref >60)
Glucose, Bld: 107 mg/dL — ABNORMAL HIGH (ref 70–99)
Potassium: 3.6 mmol/L (ref 3.5–5.1)
Sodium: 135 mmol/L (ref 135–145)
Total Bilirubin: 3.7 mg/dL — ABNORMAL HIGH (ref 0.0–1.2)
Total Protein: 5.5 g/dL — ABNORMAL LOW (ref 6.5–8.1)

## 2024-03-20 LAB — CBC WITH DIFFERENTIAL/PLATELET
Abs Immature Granulocytes: 0.12 10*3/uL — ABNORMAL HIGH (ref 0.00–0.07)
Basophils Absolute: 0 10*3/uL (ref 0.0–0.1)
Basophils Relative: 0 %
Eosinophils Absolute: 0.1 10*3/uL (ref 0.0–0.5)
Eosinophils Relative: 1 %
HCT: 42.2 % (ref 39.0–52.0)
Hemoglobin: 14.3 g/dL (ref 13.0–17.0)
Immature Granulocytes: 1 %
Lymphocytes Relative: 5 %
Lymphs Abs: 0.7 10*3/uL (ref 0.7–4.0)
MCH: 32.3 pg (ref 26.0–34.0)
MCHC: 33.9 g/dL (ref 30.0–36.0)
MCV: 95.3 fL (ref 80.0–100.0)
Monocytes Absolute: 1.1 10*3/uL — ABNORMAL HIGH (ref 0.1–1.0)
Monocytes Relative: 8 %
Neutro Abs: 11.7 10*3/uL — ABNORMAL HIGH (ref 1.7–7.7)
Neutrophils Relative %: 85 %
Platelets: 210 10*3/uL (ref 150–400)
RBC: 4.43 MIL/uL (ref 4.22–5.81)
RDW: 13 % (ref 11.5–15.5)
WBC: 13.7 10*3/uL — ABNORMAL HIGH (ref 4.0–10.5)
nRBC: 0 % (ref 0.0–0.2)

## 2024-03-20 LAB — GLUCOSE, CAPILLARY
Glucose-Capillary: 138 mg/dL — ABNORMAL HIGH (ref 70–99)
Glucose-Capillary: 107 mg/dL — ABNORMAL HIGH (ref 70–99)
Glucose-Capillary: 122 mg/dL — ABNORMAL HIGH (ref 70–99)
Glucose-Capillary: 127 mg/dL — ABNORMAL HIGH (ref 70–99)

## 2024-03-20 LAB — MAGNESIUM: Magnesium: 1.9 mg/dL (ref 1.7–2.4)

## 2024-03-20 LAB — MRSA NEXT GEN BY PCR, NASAL: MRSA by PCR Next Gen: NOT DETECTED

## 2024-03-20 NOTE — Plan of Care (Signed)

## 2024-03-20 NOTE — Progress Notes (Signed)
 PROGRESS NOTE    Sean Golden  FMW:[id number] DOB: 10/30/1945 DOA: 03/17/2024 PCP: [name], MD   Brief Narrative:  78 y.o. male with medical history significant of chronic pancreatitis, persistent atrial fibrillation, essential hypertension, GERD, sleep apnea, obesity presented with abdominal pain and nausea.  On presentation, WBC was 17.7, bicarb of 17, AST of 247, ALT of 126, total bili of 2.5, lipase of 2089.  CT of abdomen and pelvis showed acute pancreatitis with gallstones or polyps with pericholecystic fat stranding.  Right upper upper quadrant ultrasound showed cholelithiasis without acute cholecystitis and hepatic steatosis.  MRI/MRCP of abdomen showed acute interstitial pancreatitis without necrosis with probable choledocholithiasis in the midportion of common bile duct.  He was started on IV fluids and antibiotics and transferred to [facility] as per GI recommendations.  Assessment & Plan:   Acute on chronic gallstone pancreatitis Cholelithiasis and choledocholithiasis with elevated LFTs E. coli bacteremia -Imaging as above.  LFTs improving.  Currently on broad-spectrum antibiotics. BCID positive for E. coli: Follow sensitivities. - GI and general surgery following.  Cardiology consulted as per general surgery request for clearance: Patient is low risk for gallbladder surgery as per cardiology.  GI to decide timing of ERCP. - Continue IV fluids and analgesics/antiemetics as needed  Leukocytosis  - Improving.  Monitor  Hyponatremia - Improved  Acute metabolic acidosis - Continue IV fluids.  Monitor  Prediabetes with hyperglycemia - A1c 5.9.  Continue CBGs with SSI  Persistent A-fib - Rate controlled.  Continue Cardizem .  Eliquis  held.  GERD - Continue Protonix  IV  Obesity class I - Outpatient follow-up  DVT prophylaxis: Eliquis  held.  SCDs Code Status: Full Family Communication: Wife and daughter at bedside Disposition Plan: Status is:  Inpatient Remains inpatient appropriate because: Of severity of illness  Consultants: GI and general surgery.  Cardiology  Procedures: None  Antimicrobials: Zosyn  from 03/18/2024 onwards   Subjective: Patient seen and examined at bedside.  Has intermittent abdominal pain with hiccups.  Denies chest pain, shortness of breath or fever. Objective: Vitals:   03/19/24 1958 03/20/24 0407 03/20/24 0500 03/20/24 0808  BP: 133/81 119/74  117/83  Pulse: 88 70  84  Resp: 18 16  17   Temp: 98.7 F (37.1 C) 98 F (36.7 C)  98.2 F (36.8 C)  TempSrc: Oral Oral  Oral  SpO2: 94% 95%  95%  Weight:   109.7 kg   Height:        Intake/Output Summary (Last 24 hours) at 03/20/2024 0905 Last data filed at 03/20/2024 0500 Gross per 24 hour  Intake 1640.6 ml  Output 700 ml  Net 940.6 ml   Filed Weights   03/17/24 1642 03/20/24 0500  Weight: 109.7 kg 109.7 kg    Examination:  General: On room air.  No distress ENT/neck: No thyromegaly.  JVD is not elevated  respiratory: Decreased breath sounds at bases bilaterally with some crackles; no wheezing CVS: S1-S2 heard, rate controlled currently Abdominal: Soft, obese, slightly tender and distended; no organomegaly,  bowel sounds are heard Extremities: Trace lower extremity edema; no cyanosis  CNS: Awake and alert.  No focal neurologic deficit.  Moves extremities Lymph: No obvious lymphadenopathy Skin: No obvious ecchymosis/lesions  psych: Affect, judgment and mood are normal  musculoskeletal: No obvious joint swelling/deformity     Data Reviewed: I have personally reviewed following labs and imaging studies  CBC: Recent Labs  Lab 03/17/24 1704 03/18/24 0857 03/19/24 1100 03/19/24 2006 03/20/24 0321  WBC 17.7* 24.4*  16.4* 14.6* 13.7*  NEUTROABS 16.2*  --  14.3*  --  11.7*  HGB 18.5* 16.7 14.3 14.2 14.3  HCT 53.5* 49.7 42.8 41.5 42.2  MCV 94.5 95.0 95.7 95.4 95.3  PLT 318 280 223 199 210   Basic Metabolic Panel: Recent Labs  Lab  03/17/24 1704 03/18/24 0857 03/19/24 0400 03/19/24 2006 03/20/24 0321  NA 134* 135 134* 135 135  K 4.0 4.4 4.0 3.7 3.6  CL 103 106 104 107 107  CO2 17* 15* 20* 19* 18*  GLUCOSE 220* 153* 111* 121* 107*  BUN 17 18 13 10 9   CREATININE 1.03 0.89 1.05 0.98 0.89  CALCIUM  9.1 8.6* 8.5* 8.4* 8.3*  MG  --   --   --   --  1.9   GFR: Estimated Creatinine Clearance: 89 mL/min (by C-G formula based on SCr of 0.89 mg/dL). Liver Function Tests: Recent Labs  Lab 03/17/24 1704 03/18/24 0857 03/19/24 0400 03/19/24 2006 03/20/24 0321  AST 247* 290* 101* 46* 35  ALT 126* 295* 177* 117* 101*  ALKPHOS 91 112 91 87 92  BILITOT 2.5* 7.5* 5.0* 3.9* 3.7*  PROT 6.9 6.7 5.5* 5.5* 5.5*  ALBUMIN  3.9 3.5 2.8* 2.7* 2.6*   Recent Labs  Lab 03/17/24 1704 03/19/24 1100 03/19/24 2006  LIPASE 2,089* 95* 65*   No results for input(s): AMMONIA in the last 168 hours. Coagulation Profile: No results for input(s): INR, PROTIME in the last 168 hours. Cardiac Enzymes: No results for input(s): CKTOTAL, CKMB, CKMBINDEX, TROPONINI in the last 168 hours. BNP (last 3 results) Recent Labs    10/16/23 1049  PROBNP 150.0*   HbA1C: Recent Labs    03/17/24 1704  HGBA1C 5.6   CBG: Recent Labs  Lab 03/19/24 0809 03/19/24 1149 03/19/24 1629 03/19/24 1957 03/20/24 0803  GLUCAP 143* 108* 105* 139* 138*   Lipid Profile: Recent Labs    03/18/24 0919  CHOL 148  HDL 39*  LDLCALC 97  TRIG 61  CHOLHDL 3.8   Thyroid  Function Tests: No results for input(s): TSH, T4TOTAL, FREET4, T3FREE, THYROIDAB in the last 72 hours. Anemia Panel: No results for input(s): VITAMINB12, FOLATE, FERRITIN, TIBC, IRON, RETICCTPCT in the last 72 hours. Sepsis Labs: Recent Labs  Lab 03/18/24 0857  PROCALCITON 37.18    Recent Results (from the past 240 hours)  Culture, blood (Routine X 2) w Reflex to ID Panel     Status: None (Preliminary result)   Collection Time: 03/18/24  8:57  AM   Specimen: BLOOD  Result Value Ref Range Status   Specimen Description BLOOD BLOOD LEFT ARM  Final   Special Requests   Final    BOTTLES DRAWN AEROBIC AND ANAEROBIC Blood Culture results may not be optimal due to an inadequate volume of blood received in culture bottles   Culture   Final    NO GROWTH 2 DAYS Performed at [facility], [address] [place], [place] [address]    Report Status PENDING  Incomplete  Culture, blood (Routine X 2) w Reflex to ID Panel     Status: None (Preliminary result)   Collection Time: 03/18/24  9:12 AM   Specimen: BLOOD  Result Value Ref Range Status   Specimen Description   Final    BLOOD BLOOD LEFT HAND Performed at [facility], [address], [place], [place] [address]    Special Requests   Final    BOTTLES DRAWN AEROBIC AND ANAEROBIC Blood Culture results may not be optimal due to  an inadequate volume of blood received in culture bottles Performed at [facility], [address] [place], [place] [address]    Culture  Setup Time   Final    GRAM NEGATIVE RODS IN BOTH AEROBIC AND ANAEROBIC BOTTLES Gram Stain Report Called to,Read Back By and Verified With: LEVORN LEEDS, RN [facility], [phone], AT 2238 03/18/24 BY A. SNYDER CRITICAL RESULT CALLED TO, READ BACK BY AND VERIFIED WITH: PHARMD G ABBOTT 03/19/2024 @ 0418 BY AB Performed at [facility], [address] [place], [place] [address]    Culture GRAM NEGATIVE RODS  Final   Report Status PENDING  Incomplete  Blood Culture ID Panel (Reflexed)     Status: Abnormal   Collection Time: 03/18/24  9:12 AM  Result Value Ref Range Status   Enterococcus faecalis NOT DETECTED NOT DETECTED Final   Enterococcus Faecium NOT DETECTED NOT DETECTED Final   Listeria monocytogenes NOT DETECTED NOT DETECTED Final   Staphylococcus species NOT DETECTED NOT DETECTED Final   Staphylococcus aureus (BCID) NOT DETECTED NOT DETECTED Final   Staphylococcus epidermidis NOT DETECTED NOT DETECTED Final    Staphylococcus lugdunensis NOT DETECTED NOT DETECTED Final   Streptococcus species NOT DETECTED NOT DETECTED Final   Streptococcus agalactiae NOT DETECTED NOT DETECTED Final   Streptococcus pneumoniae NOT DETECTED NOT DETECTED Final   Streptococcus pyogenes NOT DETECTED NOT DETECTED Final   A.calcoaceticus-baumannii NOT DETECTED NOT DETECTED Final   Bacteroides fragilis NOT DETECTED NOT DETECTED Final   Enterobacterales DETECTED (A) NOT DETECTED Final    Comment: Enterobacterales represent a large order of gram negative bacteria, not a single organism. CRITICAL RESULT CALLED TO, READ BACK BY AND VERIFIED WITH: PHARMD G ABBOTT 03/19/2024 @ 0418 BY AB    Enterobacter cloacae complex NOT DETECTED NOT DETECTED Final   Escherichia coli DETECTED (A) NOT DETECTED Final    Comment: CRITICAL RESULT CALLED TO, READ BACK BY AND VERIFIED WITH: PHARMD G ABBOTT 03/19/2024 @ 0418 BY AB    Klebsiella aerogenes NOT DETECTED NOT DETECTED Final   Klebsiella oxytoca NOT DETECTED NOT DETECTED Final   Klebsiella pneumoniae NOT DETECTED NOT DETECTED Final   Proteus species NOT DETECTED NOT DETECTED Final   Salmonella species NOT DETECTED NOT DETECTED Final   Serratia marcescens NOT DETECTED NOT DETECTED Final   Haemophilus influenzae NOT DETECTED NOT DETECTED Final   Neisseria meningitidis NOT DETECTED NOT DETECTED Final   Pseudomonas aeruginosa NOT DETECTED NOT DETECTED Final   Stenotrophomonas maltophilia NOT DETECTED NOT DETECTED Final   Candida albicans NOT DETECTED NOT DETECTED Final   Candida auris NOT DETECTED NOT DETECTED Final   Candida glabrata NOT DETECTED NOT DETECTED Final   Candida krusei NOT DETECTED NOT DETECTED Final   Candida parapsilosis NOT DETECTED NOT DETECTED Final   Candida tropicalis NOT DETECTED NOT DETECTED Final   Cryptococcus neoformans/gattii NOT DETECTED NOT DETECTED Final   CTX-M ESBL NOT DETECTED NOT DETECTED Final   Carbapenem resistance IMP NOT DETECTED NOT DETECTED Final    Carbapenem resistance KPC NOT DETECTED NOT DETECTED Final   Carbapenem resistance NDM NOT DETECTED NOT DETECTED Final   Carbapenem resist OXA 48 LIKE NOT DETECTED NOT DETECTED Final   Carbapenem resistance VIM NOT DETECTED NOT DETECTED Final    Comment: Performed at [facility], [address], [place], [place] [address]         Radiology Studies: No results found.       Scheduled Meds:  Chlorhexidine  Gluconate Cloth  6  each Topical Daily   diltiazem   300 mg Oral Daily   insulin  aspart  0-15 Units Subcutaneous TID WC   pantoprazole  (PROTONIX ) IV  40 mg Intravenous Q12H   sodium chloride  flush  10-40 mL Intracatheter Q12H   Continuous Infusions:  sodium chloride  125 mL/hr at 03/19/24 1823   chlorproMAZINE  (THORAZINE ) 12.5 mg in sodium chloride  0.9 % 25 mL IVPB 12.5 mg (03/20/24 0729)   piperacillin -tazobactam (ZOSYN )  IV 3.375 g (03/20/24 0530)          Sophie Mao, MD [facility] 03/20/2024, 9:05 AM

## 2024-03-20 NOTE — Progress Notes (Addendum)
 Assessment & Plan: HD#4 - Acute on Chronic Pancreatitis, Cholelithiasis and Choledocholithiasis  - CT A/P 6/25 w/ acute pancreatitis, gallstones or polyps with pericholecystitic fat stranding - RUQ US  6/26 w/ Cholelithiasis without evidence of acute cholecystitis. CBD 5.3 mm. Portal vein patent.  - MRCP 6/26 w/ acute interstitial pancreatitis without pancreatic necrosis or peripancreatic/intrapancreatic collections. There was evidence of choledocholithiasis in the midportion of common bile duct. Also noted, small volume cholelithiasis without acute cholecystitis changes.  - WBC 13.7, improved on abx's - Tbili 3.7, stable - Await GI decision for ERCP - Cardiology evaluation - low risk for OR, holding Eliquis  since Wed 6/25 - timing of cholecystectomy pending ERCP and stone extraction   FEN - Per GI VTE - SCDs, hold Eliquis  ID - On Zosyn   Discussed this morning's labs, anticipated need for ERCP, and eventual lap cholecystectomy.  Patient and family understand and agree to proceed.        Krystal Spinner, MD [facility] practice Office: [phone]        Chief Complaint: Abdominal pain, biliary pancreatitis  Subjective: Patient in bed, family at bedside.  Mild pain.  Hiccups.  Objective: Vital signs in last 24 hours: Temp:  [98 F (36.7 C)-98.7 F (37.1 C)] 98 F (36.7 C) (06/28 0407) Pulse Rate:  [70-88] 70 (06/28 0407) Resp:  [16-18] 16 (06/28 0407) BP: (112-133)/(65-81) 119/74 (06/28 0407) SpO2:  [94 %-96 %] 95 % (06/28 0407) Weight:  [109.7 kg] 109.7 kg (06/28 0500) Last BM Date : 03/19/24  Intake/Output from previous day: 06/27 0701 - 06/28 0700 In: 1981.3 [P.O.:480; I.V.:1426.3; IV Piggyback:75] Out: 700 [Urine:700] Intake/Output this shift: No intake/output data recorded.  Physical Exam: HEENT - sclerae clear, mucous membranes moist Abdomen - soft, obese; mild epigastric and RUQ tenderness  Lab Results:  Recent Labs     03/19/24 2006 03/20/24 0321  WBC 14.6* 13.7*  HGB 14.2 14.3  HCT 41.5 42.2  PLT 199 210   BMET Recent Labs    03/19/24 2006 03/20/24 0321  NA 135 135  K 3.7 3.6  CL 107 107  CO2 19* 18*  GLUCOSE 121* 107*  BUN 10 9  CREATININE 0.98 0.89  CALCIUM  8.4* 8.3*   PT/INR No results for input(s): LABPROT, INR in the last 72 hours. Comprehensive Metabolic Panel:    Component Value Date/Time   NA 135 03/20/2024 0321   NA 135 03/19/2024 2006   NA 137 08/11/2019 0000   NA 137 06/22/2018 0000   K 3.6 03/20/2024 0321   K 3.7 03/19/2024 2006   CL 107 03/20/2024 0321   CL 107 03/19/2024 2006   CO2 18 (L) 03/20/2024 0321   CO2 19 (L) 03/19/2024 2006   BUN 9 03/20/2024 0321   BUN 10 03/19/2024 2006   BUN 25 01/10/2022 0941   BUN 13 08/11/2019 0000   BUN 13 08/11/2019 0000   CREATININE 0.89 03/20/2024 0321   CREATININE 0.98 03/19/2024 2006   CREATININE 0.97 12/11/2022 1404   CREATININE 1.01 05/27/2014 0945   GLUCOSE 107 (H) 03/20/2024 0321   GLUCOSE 121 (H) 03/19/2024 2006   CALCIUM  8.3 (L) 03/20/2024 0321   CALCIUM  8.4 (L) 03/19/2024 2006   AST 35 03/20/2024 0321   AST 46 (H) 03/19/2024 2006   ALT 101 (H) 03/20/2024 0321   ALT 117 (H) 03/19/2024 2006   ALKPHOS 92 03/20/2024 0321   ALKPHOS 87 03/19/2024 2006   BILITOT 3.7 (H) 03/20/2024 0321   BILITOT 3.9 (  H) 03/19/2024 2006   PROT 5.5 (L) 03/20/2024 0321   PROT 5.5 (L) 03/19/2024 2006   ALBUMIN  2.6 (L) 03/20/2024 0321   ALBUMIN  2.7 (L) 03/19/2024 2006    Studies/Results: US  Abdomen Limited RUQ (LIVER/GB) Result Date: 03/18/2024 CLINICAL DATA:  Right upper quadrant pain. EXAM: ULTRASOUND ABDOMEN LIMITED RIGHT UPPER QUADRANT COMPARISON:  None Available. FINDINGS: Gallbladder: A 6.7 mm gallstone is seen near the neck of the gallbladder. There is no evidence of gallbladder wall thickening (2.2 mm). No sonographic Murphy sign noted by sonographer. Common bile duct: Diameter: 5.3 mm Liver: No focal lesion identified.  Diffusely increased echogenicity of the liver parenchyma is noted. Portal vein is patent on color Doppler imaging with normal direction of blood flow towards the liver. Other: None. IMPRESSION: 1. Cholelithiasis without evidence of acute cholecystitis. 2. Hepatic steatosis. Electronically Signed   By: Suzen Dials M.D.   On: 03/18/2024 09:27   MR ABDOMEN MRCP W WO CONTAST Result Date: 03/18/2024 CLINICAL DATA:  Cholelithiasis.  Pancreatitis, acute, severe. EXAM: MRI ABDOMEN WITHOUT AND WITH CONTRAST (INCLUDING MRCP) TECHNIQUE: Multiplanar multisequence MR imaging of the abdomen was performed both before and after the administration of intravenous contrast. Heavily T2-weighted images of the biliary and pancreatic ducts were obtained, and three-dimensional MRCP images were rendered by post processing. CONTRAST:  10mL GADAVIST  GADOBUTROL  1 MMOL/ML IV SOLN COMPARISON:  CT scan abdomen and pelvis from 03/17/2024. FINDINGS: Lower chest: Unremarkable MR appearance to the lung bases. No pleural effusion. No pericardial effusion. Normal heart size. Hepatobiliary: The liver is normal in size. Noncirrhotic configuration. There is mild diffuse hepatic steatosis. Is there is a 5 mm simple cyst in the left hepatic lobe, segment 2. No suspicious liver lesion. The portal vein and hepatic veins are patent. No intrahepatic or extrahepatic bile duct dilatation. However, there is a single approximately 4 x 7 mm filling defect noted in the midportion of common bile duct (series 11, image 12), which may represent a choledocholithiasis. However, this filling defect is not seen on any other sequences including the MRCP sequences. Gallbladder is physiologically distended. Small volume dependent gallstones noted. No abnormal gallbladder wall thickening or pericholecystic fat stranding. There is trace amount of fluid in the interface between the gallbladder and liver, likely reactive ascites from pancreatitis. Pancreas: There is  slightly heterogeneous and bulky pancreatic body/tail with peripancreatic fat stranding, compatible with acute interstitial pancreatitis. No pancreatic necrosis or peripancreatic/intrapancreatic collections. Main pancreatic duct is not dilated. No suspicious pancreatic lesion. Spleen:  Within normal limits in size and appearance. No focal mass. Adrenals/Urinary Tract: Unremarkable adrenal glands. No hydroureteronephrosis. No suspicious renal mass. There multiple scattered cysts throughout bilateral kidneys with largest in the left kidney lower pole measuring up to 1.3 x 1.3 cm. Stomach/Bowel: Visualized portions within the abdomen are unremarkable. No disproportionate dilation of bowel loops. There multiple diverticula throughout the colon without imaging signs of diverticulitis. Vascular/Lymphatic: No pathologically enlarged lymph nodes identified. No abdominal aortic aneurysm demonstrated. There is trace amount of free fluid in the central upper abdomen as well as mild amount of fat stranding along the mesenteric vessels, favored reactive in the settings of pancreatitis. No walled-off abscess or loculated collection. Other:  There is small fat containing periumbilical hernia. Musculoskeletal: A hemangioma is noted in the right aspect of the L2 vertebral body. IMPRESSION: 1. Acute interstitial pancreatitis. No pancreatic necrosis or peripancreatic/intrapancreatic collections. No main pancreatic duct dilation. No intra or extrahepatic bile duct dilation. There is a probable single 4 x 7 mm  choledocholithiasis in the midportion of common bile duct. Small volume cholelithiasis without acute cholecystitis. 2. Multiple other nonacute observations, as described above. Electronically Signed   By: Ree Molt M.D.   On: 03/18/2024 08:54   MR 3D Recon At Scanner Result Date: 03/18/2024 CLINICAL DATA:  Cholelithiasis.  Pancreatitis, acute, severe. EXAM: MRI ABDOMEN WITHOUT AND WITH CONTRAST (INCLUDING MRCP) TECHNIQUE:  Multiplanar multisequence MR imaging of the abdomen was performed both before and after the administration of intravenous contrast. Heavily T2-weighted images of the biliary and pancreatic ducts were obtained, and three-dimensional MRCP images were rendered by post processing. CONTRAST:  10mL GADAVIST  GADOBUTROL  1 MMOL/ML IV SOLN COMPARISON:  CT scan abdomen and pelvis from 03/17/2024. FINDINGS: Lower chest: Unremarkable MR appearance to the lung bases. No pleural effusion. No pericardial effusion. Normal heart size. Hepatobiliary: The liver is normal in size. Noncirrhotic configuration. There is mild diffuse hepatic steatosis. Is there is a 5 mm simple cyst in the left hepatic lobe, segment 2. No suspicious liver lesion. The portal vein and hepatic veins are patent. No intrahepatic or extrahepatic bile duct dilatation. However, there is a single approximately 4 x 7 mm filling defect noted in the midportion of common bile duct (series 11, image 12), which may represent a choledocholithiasis. However, this filling defect is not seen on any other sequences including the MRCP sequences. Gallbladder is physiologically distended. Small volume dependent gallstones noted. No abnormal gallbladder wall thickening or pericholecystic fat stranding. There is trace amount of fluid in the interface between the gallbladder and liver, likely reactive ascites from pancreatitis. Pancreas: There is slightly heterogeneous and bulky pancreatic body/tail with peripancreatic fat stranding, compatible with acute interstitial pancreatitis. No pancreatic necrosis or peripancreatic/intrapancreatic collections. Main pancreatic duct is not dilated. No suspicious pancreatic lesion. Spleen:  Within normal limits in size and appearance. No focal mass. Adrenals/Urinary Tract: Unremarkable adrenal glands. No hydroureteronephrosis. No suspicious renal mass. There multiple scattered cysts throughout bilateral kidneys with largest in the left kidney  lower pole measuring up to 1.3 x 1.3 cm. Stomach/Bowel: Visualized portions within the abdomen are unremarkable. No disproportionate dilation of bowel loops. There multiple diverticula throughout the colon without imaging signs of diverticulitis. Vascular/Lymphatic: No pathologically enlarged lymph nodes identified. No abdominal aortic aneurysm demonstrated. There is trace amount of free fluid in the central upper abdomen as well as mild amount of fat stranding along the mesenteric vessels, favored reactive in the settings of pancreatitis. No walled-off abscess or loculated collection. Other:  There is small fat containing periumbilical hernia. Musculoskeletal: A hemangioma is noted in the right aspect of the L2 vertebral body. IMPRESSION: 1. Acute interstitial pancreatitis. No pancreatic necrosis or peripancreatic/intrapancreatic collections. No main pancreatic duct dilation. No intra or extrahepatic bile duct dilation. There is a probable single 4 x 7 mm choledocholithiasis in the midportion of common bile duct. Small volume cholelithiasis without acute cholecystitis. 2. Multiple other nonacute observations, as described above. Electronically Signed   By: Ree Molt M.D.   On: 03/18/2024 08:54      Krystal Spinner 03/20/2024  Patient ID: Sean Golden, male   DOB: November 19, 1945, 78 y.o.   MRN: [id number]

## 2024-03-20 NOTE — Progress Notes (Signed)
      Progress Note   Subjective  Patient reports he continues to feel better. Still with some epigastric pain but improvin.   Objective   Vital signs in last 24 hours: Temp:  [98 F (36.7 C)-98.7 F (37.1 C)] 98.2 F (36.8 C) (06/28 0808) Pulse Rate:  [70-88] 84 (06/28 0808) Resp:  [16-18] 17 (06/28 0808) BP: (117-133)/(74-83) 117/83 (06/28 0808) SpO2:  [94 %-96 %] 95 % (06/28 0808) Weight:  [109.7 kg] 109.7 kg (06/28 0500) Last BM Date : 03/19/24 General:    white male in NAD Abdomen:  Soft, mild epigastric TTP. Neurologic:  Alert and oriented,  grossly normal neurologically. Psych:  Cooperative. Normal mood and affect.  Intake/Output from previous day: 06/27 0701 - 06/28 0700 In: 1981.3 [P.O.:480; I.V.:1426.3; IV Piggyback:75] Out: 700 [Urine:700] Intake/Output this shift: No intake/output data recorded.  Lab Results: Recent Labs    03/19/24 1100 03/19/24 2006 03/20/24 0321  WBC 16.4* 14.6* 13.7*  HGB 14.3 14.2 14.3  HCT 42.8 41.5 42.2  PLT 223 199 210   BMET Recent Labs    03/19/24 0400 03/19/24 2006 03/20/24 0321  NA 134* 135 135  K 4.0 3.7 3.6  CL 104 107 107  CO2 20* 19* 18*  GLUCOSE 111* 121* 107*  BUN 13 10 9   CREATININE 1.05 0.98 0.89  CALCIUM  8.5* 8.4* 8.3*   LFT Recent Labs    03/20/24 0321  PROT 5.5*  ALBUMIN  2.6*  AST 35  ALT 101*  ALKPHOS 92  BILITOT 3.7*   PT/INR No results for input(s): LABPROT, INR in the last 72 hours.  Studies/Results: No results found.     Assessment / Plan:    78 y/o male with the following:  Gallstone pancreatitis Choledocholithiasis E coli bacteremia  LAEs continue to downtrend and he continues to feel better. Suspect he likely has passed the stone from his bile duct. Spoke with surgery about his case - regarding possibility of lap chole with IOC given suspicion that stone has passed, hopefully can avoid putting him through an ERCP. They feel that is reasonable. Discussed plan with the  patient and he agrees.   Clear liquids today, NPO after MN, continue trend LFTs. Tentatively will have cholecystectomy tomorrow. If IOC is positive please contact us  for ERCP. Otherwise continue antibiotics and supportive care for pancreatitis.  Call with questions.  Marcey Naval, MD [facility]

## 2024-03-21 ENCOUNTER — Inpatient Hospital Stay (HOSPITAL_COMMUNITY): Admitting: Certified Registered Nurse Anesthetist

## 2024-03-21 ENCOUNTER — Encounter (HOSPITAL_COMMUNITY)
Admission: EM | Disposition: A | Discharge status: Home / Self Care | Payer: Self-pay | Source: Home / Self Care | Attending: Internal Medicine

## 2024-03-21 ENCOUNTER — Other Ambulatory Visit: Payer: Self-pay

## 2024-03-21 ENCOUNTER — Encounter (HOSPITAL_COMMUNITY): Payer: Self-pay | Admitting: Internal Medicine

## 2024-03-21 ENCOUNTER — Inpatient Hospital Stay (HOSPITAL_COMMUNITY)

## 2024-03-21 DIAGNOSIS — K861 Other chronic pancreatitis: Secondary | ICD-10-CM | POA: Diagnosis not present

## 2024-03-21 DIAGNOSIS — K8010 Calculus of gallbladder with chronic cholecystitis without obstruction: Secondary | ICD-10-CM

## 2024-03-21 DIAGNOSIS — K859 Acute pancreatitis without necrosis or infection, unspecified: Secondary | ICD-10-CM | POA: Diagnosis not present

## 2024-03-21 HISTORY — PX: GALLBLADDER SURGERY: SHX652

## 2024-03-21 HISTORY — PX: CHOLECYSTECTOMY: SHX55

## 2024-03-21 LAB — COMPREHENSIVE METABOLIC PANEL WITH GFR
ALT: 61 U/L — ABNORMAL HIGH (ref 0–44)
AST: 20 U/L (ref 15–41)
Albumin: 2.3 g/dL — ABNORMAL LOW (ref 3.5–5.0)
Alkaline Phosphatase: 92 U/L (ref 38–126)
Anion gap: 8 (ref 5–15)
BUN: 6 mg/dL — ABNORMAL LOW (ref 8–23)
CO2: 20 mmol/L — ABNORMAL LOW (ref 22–32)
Calcium: 8 mg/dL — ABNORMAL LOW (ref 8.9–10.3)
Chloride: 107 mmol/L (ref 98–111)
Creatinine, Ser: 0.72 mg/dL (ref 0.61–1.24)
GFR, Estimated: >60 mL/min (ref >60)
Glucose, Bld: 110 mg/dL — ABNORMAL HIGH (ref 70–99)
Potassium: 3.2 mmol/L — ABNORMAL LOW (ref 3.5–5.1)
Sodium: 135 mmol/L (ref 135–145)
Total Bilirubin: 2.4 mg/dL — ABNORMAL HIGH (ref 0.0–1.2)
Total Protein: 5.5 g/dL — ABNORMAL LOW (ref 6.5–8.1)

## 2024-03-21 LAB — GLUCOSE, CAPILLARY
Glucose-Capillary: 119 mg/dL — ABNORMAL HIGH (ref 70–99)
Glucose-Capillary: 149 mg/dL — ABNORMAL HIGH (ref 70–99)
Glucose-Capillary: 200 mg/dL — ABNORMAL HIGH (ref 70–99)
Glucose-Capillary: 166 mg/dL — ABNORMAL HIGH (ref 70–99)

## 2024-03-21 LAB — CBC WITH DIFFERENTIAL/PLATELET
Abs Immature Granulocytes: 0.14 10*3/uL — ABNORMAL HIGH (ref 0.00–0.07)
Basophils Absolute: 0.1 10*3/uL (ref 0.0–0.1)
Basophils Relative: 1 %
Eosinophils Absolute: 0.1 10*3/uL (ref 0.0–0.5)
Eosinophils Relative: 1 %
HCT: 42.7 % (ref 39.0–52.0)
Hemoglobin: 14.4 g/dL (ref 13.0–17.0)
Immature Granulocytes: 1 %
Lymphocytes Relative: 6 %
Lymphs Abs: 0.7 10*3/uL (ref 0.7–4.0)
MCH: 31.8 pg (ref 26.0–34.0)
MCHC: 33.7 g/dL (ref 30.0–36.0)
MCV: 94.3 fL (ref 80.0–100.0)
Monocytes Absolute: 1.3 10*3/uL — ABNORMAL HIGH (ref 0.1–1.0)
Monocytes Relative: 11 %
Neutro Abs: 9.6 10*3/uL — ABNORMAL HIGH (ref 1.7–7.7)
Neutrophils Relative %: 80 %
Platelets: 217 10*3/uL (ref 150–400)
RBC: 4.53 MIL/uL (ref 4.22–5.81)
RDW: 12.9 % (ref 11.5–15.5)
WBC: 12 10*3/uL — ABNORMAL HIGH (ref 4.0–10.5)
nRBC: 0 % (ref 0.0–0.2)

## 2024-03-21 LAB — MAGNESIUM: Magnesium: 1.8 mg/dL (ref 1.7–2.4)

## 2024-03-21 SURGERY — LAPAROSCOPIC CHOLECYSTECTOMY WITH INTRAOPERATIVE CHOLANGIOGRAM
Anesthesia: General

## 2024-03-21 MED ORDER — HYDROMORPHONE HCL 1 MG/ML IJ SOLN
0.5000 mg | INTRAMUSCULAR | Status: DC | PRN
  Administered 2024-03-22: 0.5 mg via INTRAVENOUS

## 2024-03-21 MED ORDER — FENTANYL CITRATE (PF) 250 MCG/5ML IJ SOLN
INTRAMUSCULAR | Status: AC
  Filled 2024-03-21: qty 5

## 2024-03-21 MED ORDER — POTASSIUM CHLORIDE IN NACL 40-0.9 MEQ/L-% IV SOLN
INTRAVENOUS | Status: DC
  Filled 2024-03-21 (×3): qty 1000

## 2024-03-21 MED ORDER — SODIUM CHLORIDE 0.9 % IR SOLN
Status: DC | PRN
  Administered 2024-03-21: 1000 mL

## 2024-03-21 MED ORDER — ROCURONIUM BROMIDE 10 MG/ML (PF) SYRINGE
PREFILLED_SYRINGE | INTRAVENOUS | Status: DC | PRN
  Administered 2024-03-21: 10 mg via INTRAVENOUS
  Administered 2024-03-21: 60 mg via INTRAVENOUS

## 2024-03-21 MED ORDER — ACETAMINOPHEN 10 MG/ML IV SOLN
INTRAVENOUS | Status: AC
  Filled 2024-03-21: qty 100

## 2024-03-21 MED ORDER — PROPOFOL 10 MG/ML IV BOLUS
INTRAVENOUS | Status: DC | PRN
  Administered 2024-03-21: 110 mg via INTRAVENOUS
  Administered 2024-03-21: 30 mg via INTRAVENOUS

## 2024-03-21 MED ORDER — METHOCARBAMOL 1000 MG/10ML IJ SOLN: 500.0000 mg | Freq: Four times a day (QID) | INTRAMUSCULAR | Status: DC | PRN

## 2024-03-21 MED ORDER — ONDANSETRON HCL 4 MG/2ML IJ SOLN
INTRAMUSCULAR | Status: DC | PRN
  Administered 2024-03-21: 4 mg via INTRAVENOUS

## 2024-03-21 MED ORDER — BUPIVACAINE-EPINEPHRINE 0.25% -1:200000 IJ SOLN
INTRAMUSCULAR | Status: DC | PRN
  Administered 2024-03-21: 30 mL

## 2024-03-21 MED ORDER — PHENYLEPHRINE 80 MCG/ML (10ML) SYRINGE FOR IV PUSH (FOR BLOOD PRESSURE SUPPORT)
PREFILLED_SYRINGE | INTRAVENOUS | Status: AC
  Filled 2024-03-21: qty 10

## 2024-03-21 MED ORDER — SUGAMMADEX SODIUM 200 MG/2ML IV SOLN
INTRAVENOUS | Status: DC | PRN
  Administered 2024-03-21: 213 mg via INTRAVENOUS

## 2024-03-21 MED ORDER — ACETAMINOPHEN 10 MG/ML IV SOLN
INTRAVENOUS | Status: DC | PRN
  Administered 2024-03-21: 1000 mg via INTRAVENOUS

## 2024-03-21 MED ORDER — ACETAMINOPHEN 325 MG PO TABS
650.0000 mg | ORAL_TABLET | Freq: Four times a day (QID) | ORAL | Status: DC
  Administered 2024-03-21 – 2024-03-23 (×6): 650 mg via ORAL
  Filled 2024-03-21 (×7): qty 2

## 2024-03-21 MED ORDER — FENTANYL CITRATE (PF) 250 MCG/5ML IJ SOLN
INTRAMUSCULAR | Status: DC | PRN
  Administered 2024-03-21: 50 ug via INTRAVENOUS
  Administered 2024-03-21: 100 ug via INTRAVENOUS

## 2024-03-21 MED ORDER — ROCURONIUM BROMIDE 10 MG/ML (PF) SYRINGE
PREFILLED_SYRINGE | INTRAVENOUS | Status: AC
  Filled 2024-03-21: qty 10

## 2024-03-21 MED ORDER — TAMSULOSIN HCL 0.4 MG PO CAPS
0.8000 mg | ORAL_CAPSULE | Freq: Every evening | ORAL | Status: DC
  Administered 2024-03-21 – 2024-03-22 (×2): 0.8 mg via ORAL
  Filled 2024-03-21 (×2): qty 2

## 2024-03-21 MED ORDER — CHLORHEXIDINE GLUCONATE 0.12 % MT SOLN
OROMUCOSAL | Status: AC
  Filled 2024-03-21: qty 15

## 2024-03-21 MED ORDER — OXYCODONE HCL 5 MG PO TABS: 5.0000 mg | ORAL_TABLET | ORAL | Status: DC | PRN

## 2024-03-21 MED ORDER — ACETAMINOPHEN 10 MG/ML IV SOLN
1000.0000 mg | Freq: Once | INTRAVENOUS | Status: DC | PRN
Start: 2024-03-21 — End: 2024-03-21

## 2024-03-21 MED ORDER — LIDOCAINE 2% (20 MG/ML) 5 ML SYRINGE
INTRAMUSCULAR | Status: AC
  Filled 2024-03-21: qty 5

## 2024-03-21 MED ORDER — LIDOCAINE 2% (20 MG/ML) 5 ML SYRINGE
INTRAMUSCULAR | Status: DC | PRN
  Administered 2024-03-21: 80 mg via INTRAVENOUS

## 2024-03-21 MED ORDER — ORAL CARE MOUTH RINSE: 15.0000 mL | Freq: Once | OROMUCOSAL | Status: AC

## 2024-03-21 MED ORDER — OXYCODONE HCL 5 MG PO TABS: 5.0000 mg | ORAL_TABLET | Freq: Once | ORAL | Status: DC | PRN

## 2024-03-21 MED ORDER — PROCHLORPERAZINE EDISYLATE 10 MG/2ML IJ SOLN: 10.0000 mg | INTRAMUSCULAR | Status: DC | PRN

## 2024-03-21 MED ORDER — ONDANSETRON HCL 4 MG/2ML IJ SOLN
INTRAMUSCULAR | Status: AC
  Filled 2024-03-21: qty 2

## 2024-03-21 MED ORDER — LACTATED RINGERS IV SOLN: INTRAVENOUS | Status: DC

## 2024-03-21 MED ORDER — BUPIVACAINE-EPINEPHRINE (PF) 0.25% -1:200000 IJ SOLN
INTRAMUSCULAR | Status: AC
  Filled 2024-03-21: qty 30

## 2024-03-21 MED ORDER — OXYCODONE HCL 5 MG/5ML PO SOLN: 5.0000 mg | Freq: Once | ORAL | Status: DC | PRN

## 2024-03-21 MED ORDER — DEXAMETHASONE SODIUM PHOSPHATE 10 MG/ML IJ SOLN
INTRAMUSCULAR | Status: AC
  Filled 2024-03-21: qty 1

## 2024-03-21 MED ORDER — 0.9 % SODIUM CHLORIDE (POUR BTL) OPTIME
TOPICAL | Status: DC | PRN
  Administered 2024-03-21: 1000 mL

## 2024-03-21 MED ORDER — PHENYLEPHRINE 80 MCG/ML (10ML) SYRINGE FOR IV PUSH (FOR BLOOD PRESSURE SUPPORT)
PREFILLED_SYRINGE | INTRAVENOUS | Status: DC | PRN
  Administered 2024-03-21 (×2): 80 ug via INTRAVENOUS

## 2024-03-21 MED ORDER — DEXAMETHASONE SODIUM PHOSPHATE 10 MG/ML IJ SOLN
INTRAMUSCULAR | Status: DC | PRN
  Administered 2024-03-21: 5 mg via INTRAVENOUS

## 2024-03-21 MED ORDER — FENTANYL CITRATE (PF) 100 MCG/2ML IJ SOLN: 25.0000 ug | INTRAMUSCULAR | Status: DC | PRN

## 2024-03-21 MED ORDER — CHLORHEXIDINE GLUCONATE 0.12 % MT SOLN
15.0000 mL | Freq: Once | OROMUCOSAL | Status: AC
  Administered 2024-03-21: 15 mL via OROMUCOSAL

## 2024-03-21 MED ORDER — PROPOFOL 10 MG/ML IV BOLUS
INTRAVENOUS | Status: AC
Start: 2024-03-21 — End: 2024-03-21
  Filled 2024-03-21: qty 20

## 2024-03-21 MED ORDER — OXYCODONE HCL 5 MG PO TABS: 10.0000 mg | ORAL_TABLET | ORAL | Status: DC | PRN

## 2024-03-21 SURGICAL SUPPLY — 42 items
BAG COUNTER SPONGE SURGICOUNT (BAG) ×1 IMPLANT
CANISTER SUCTION 3000ML PPV (SUCTIONS) ×1 IMPLANT
CATH URETL OPEN 5X70 (CATHETERS) ×1 IMPLANT
CATH URETL OPEN END 6FR 70 (CATHETERS) IMPLANT
CHLORAPREP W/TINT 26 (MISCELLANEOUS) ×1 IMPLANT
CLIP APPLIE ROT 10 11.4 M/L (STAPLE) ×1 IMPLANT
COVER MAYO STAND STRL (DRAPES) IMPLANT
COVER SURGICAL LIGHT HANDLE (MISCELLANEOUS) ×1 IMPLANT
DERMABOND ADVANCED .7 DNX12 (GAUZE/BANDAGES/DRESSINGS) ×1 IMPLANT
DRAPE C-ARM 42X120 X-RAY (DRAPES) ×1 IMPLANT
ELECTRODE REM PT RTRN 9FT ADLT (ELECTROSURGICAL) ×1 IMPLANT
ENDOLOOP SUT PDS II 0 18 (SUTURE) IMPLANT
GLOVE BIO SURGEON STRL SZ7.5 (GLOVE) ×1 IMPLANT
GLOVE BIOGEL PI IND STRL 8 (GLOVE) ×1 IMPLANT
GOWN STRL REUS W/ TWL LRG LVL3 (GOWN DISPOSABLE) ×2 IMPLANT
GOWN STRL REUS W/ TWL XL LVL3 (GOWN DISPOSABLE) ×1 IMPLANT
GRASPER SUT TROCAR 14GX15 (MISCELLANEOUS) ×1 IMPLANT
IRRIGATION SUCT STRKRFLW 2 WTP (MISCELLANEOUS) ×1 IMPLANT
KIT BASIN OR (CUSTOM PROCEDURE TRAY) ×1 IMPLANT
KIT IMAGING PINPOINTPAQ (MISCELLANEOUS) IMPLANT
KIT TURNOVER KIT B (KITS) ×1 IMPLANT
NDL 22X1.5 STRL (OR ONLY) (MISCELLANEOUS) ×1 IMPLANT
NDL INSUFFLATION 14GA 120MM (NEEDLE) ×1 IMPLANT
NEEDLE 22X1.5 STRL (OR ONLY) (MISCELLANEOUS) ×1 IMPLANT
NEEDLE INSUFFLATION 14GA 120MM (NEEDLE) ×1 IMPLANT
NS IRRIG 1000ML POUR BTL (IV SOLUTION) ×1 IMPLANT
PAD ARMBOARD POSITIONER FOAM (MISCELLANEOUS) ×1 IMPLANT
POUCH RETRIEVAL ECOSAC 10 (ENDOMECHANICALS) ×1 IMPLANT
SCISSORS LAP 5X35 DISP (ENDOMECHANICALS) ×1 IMPLANT
SET CHOLANGIOGRAPH 5 50 .035 (SET/KITS/TRAYS/PACK) IMPLANT
SET TUBE SMOKE EVAC HIGH FLOW (TUBING) ×1 IMPLANT
SLEEVE Z-THREAD 5X100MM (TROCAR) ×2 IMPLANT
SPECIMEN JAR SMALL (MISCELLANEOUS) ×1 IMPLANT
STOPCOCK 4 WAY LG BORE MALE ST (IV SETS) ×1 IMPLANT
SUT MNCRL AB 4-0 PS2 18 (SUTURE) ×1 IMPLANT
TOWEL GREEN STERILE (TOWEL DISPOSABLE) ×1 IMPLANT
TOWEL GREEN STERILE FF (TOWEL DISPOSABLE) ×1 IMPLANT
TRAY LAPAROSCOPIC MC (CUSTOM PROCEDURE TRAY) ×1 IMPLANT
TROCAR 11X100 Z THREAD (TROCAR) ×1 IMPLANT
TROCAR Z-THREAD OPTICAL 5X100M (TROCAR) ×1 IMPLANT
WARMER LAPAROSCOPE (MISCELLANEOUS) ×1 IMPLANT
WATER STERILE IRR 1000ML POUR (IV SOLUTION) ×1 IMPLANT

## 2024-03-21 NOTE — Progress Notes (Signed)
 Mobility Specialist: Progress Note   03/21/24 1558  Mobility  Activity Ambulated with assistance in hallway  Level of Assistance Standby assist, set-up cues, supervision of patient - no hands on  Assistive Device Other (Comment) (IV pole)  Distance Ambulated (ft) 100 ft  Activity Response Tolerated well  Mobility Referral Yes  Mobility visit 1 Mobility  Mobility Specialist Start Time (ACUTE ONLY) 1440  Mobility Specialist Stop Time (ACUTE ONLY) 1446  Mobility Specialist Time Calculation (min) (ACUTE ONLY) 6 min    Pt received ambulating in room with wife. SV throughout. No complaints just feeling SOB and fatigued by end of ambulation. Returned to room without fault. Left on EOB with all needs met, call bell in reach.   Ileana Lute Mobility Specialist Please contact via SecureChat or Rehab office at [phone]

## 2024-03-21 NOTE — Op Note (Signed)
 Patient: Sean Golden (1946/01/16, [id number])  Date of Surgery: 03/21/2024  Preoperative Diagnosis: GALLSTONE Pancreatitis  Postoperative Diagnosis: GALLSTONE pancreatitis  Surgical Procedure: LAPAROSCOPIC CHOLECYSTECTOMY    Operative Team Members:  Surgeons and Role:    * Carmello Cabiness, Deward PARAS, MD - Primary   Anesthesiologist: Leopoldo Bruckner, MD CRNA: Loreli Blima LABOR, CRNA   Anesthesia: General   Fluids:  No intake/output data recorded.  Complications: None  Drains:  none   Specimen:  ID Type Source Tests Collected by Time Destination  1 : Gallbladder Tissue PATH Gallbladder SURGICAL PATHOLOGY Delaynee Alred, Deward PARAS, MD 03/21/2024 1057      Disposition:  PACU - hemodynamically stable.  Plan of Care: Admit to inpatient     Indications for Procedure: TREYLIN BURTCH is a 78 y.o. male who presented with abdominal pain.  History, physical and imaging was concerning for gallstone pancreatitis.  Laparoscopic cholecystectomy was recommended for the patient.  An MRCP was concerning for possible choledocholith, but his symptoms and LFTs improved without ERCP so we proceeded to the OR and planned for intraoperative cholangiogram.  The procedure itself, as well as the risks, benefits and alternatives were discussed with the patient.  Risks discussed included but were not limited to the risk of infection, bleeding, damage to nearby structures, need to convert to open procedure, incisional hernia, bile leak, common bile duct injury and the need for additional procedures or surgeries.  With this discussion complete and all questions answered the patient granted consent to proceed.  Findings: Severe adhesions in right upper quadrant surrounding the  gallbladder.  Inflamed gallbladder with short cystic duct - anatomy did not allow cholangiogram to be performed safely, so it wasn't attempted.  Infection status: Patient: [facility] Patient Case:  Urgent Infection Present At Time Of Surgery (PATOS): Some spillage of bile.   Description of Procedure:   On the date stated above, the patient was taken to the operating room suite and placed in supine positioning.  Sequential compression devices were placed on the lower extremities to prevent blood clots.  General endotracheal anesthesia was induced. Preoperative antibiotics were given.  The patient's abdomen was prepped and draped in the usual sterile fashion.  A time-out was completed verifying the correct patient, procedure, positioning and equipment needed for the case.  We began by anesthetizing the skin with local anesthetic and then making a 5 mm incision just below the umbilicus.  We dissected through the subcutaneous tissues to the fascia.  The fascia was grasped and elevated using a Kocher clamp.  A Veress needle was inserted into the abdomen and the abdomen was insufflated to 15 mmHg.  A 5 mm trocar was inserted in this position under optical guidance and then the abdomen was inspected.  There was no trauma to the underlying viscera with initial trocar placement.  Any abnormal findings, other than inflammation in the right upper quadrant, are listed above in the findings section.  Three additional trocars were placed, one 12 mm trocar in the subxiphoid position, one 5 mm trocar in the midline epigastric area and one 5mm trocar in the right upper quadrant subcostally.  These were placed under direct vision without any trauma to the underlying viscera.  A fifth trocar was placed in the mid epigastric area to aid with retraction due to severe adhesive disease.  The patient was then placed in head up, left side down positioning.  The gallbladder was identified and dissected free from its attachments to the  omentum allowing the duodenum to fall away.  There were significant adhesions throughout the right upper quadrant.  An additional trocar was required as described above.  The infundibulum of the  gallbladder was dissected free working laterally to medially.  The cystic duct and cystic artery were dissected free from surrounding connective tissue.  The infundibulum of the gallbladder was dissected off the cystic plate.  A critical view of safety was obtained with the cystic duct and cystic artery being cleared of connective tissues and clearly the only two structures entering into the gallbladder with the liver clearly visible behind.  Clips were then applied to the cystic duct and cystic artery and then these structures were divided.  The cystic artery bleed despite the clips and retracted.  A PDS endoloop was placed on the artery and hemostasis was achieved.  PDS endoloop was placed on the cystic duct stump. The gallbladder was dissected off the cystic plate, placed in an endocatch bag and removed from the 12 mm subxiphoid port site.  The clips were inspected and appeared effective.  The cystic plate was inspected and hemostasis was obtained using electrocautery.  A suction irrigator was used to clean the operative field.  Attention was turned to closure.  The 12 mm subxiphoid port site was closed using a 0-vicryl suture on a fascial suture passer.  The abdomen was desufflated.  The skin was closed using 4-0 monocryl and dermabond.  All sponge and needle counts were correct at the conclusion of the case.    Deward Foy, MD General, Bariatric, & Minimally Invasive Surgery [facility], [place]

## 2024-03-21 NOTE — Progress Notes (Signed)
 Assessment & Plan: HD#5 - Acute on Chronic Pancreatitis, Cholelithiasis and Choledocholithiasis  - CT A/P 6/25 w/ acute pancreatitis, gallstones or polyps with pericholecystitic fat stranding - RUQ US  6/26 w/ Cholelithiasis without evidence of acute cholecystitis. CBD 5.3 mm. Portal vein patent.  - MRCP 6/26 w/ acute interstitial pancreatitis without pancreatic necrosis or peripancreatic/intrapancreatic collections. There was evidence of choledocholithiasis in the midportion of common bile duct. Also noted, small volume cholelithiasis without acute cholecystitis changes.  - WBC 12K, improved on abx's - Tbili 2.4, improved - Cardiology evaluation - low risk for OR, holding Eliquis  since Wed 6/25 - symptomatically improved  Will plan to proceed with lap chole with IOC today as discussed by GI.  Dr. Lyndel aware.   FEN - NPO VTE - SCDs, hold Eliquis  ID - On Zosyn         Krystal Spinner, MD [facility] practice Office: [phone]        Chief Complaint: Biliary pancreatitis  Subjective: Patient up at bedside, family in room.  Denies pain.  Objective: Vital signs in last 24 hours: Temp:  [97.7 F (36.5 C)-98.2 F (36.8 C)] 97.7 F (36.5 C) (06/29 0415) Pulse Rate:  [68-84] 84 (06/29 0415) Resp:  [16-18] 18 (06/29 0415) BP: (136-139)/(80-85) 138/85 (06/29 0415) SpO2:  [96 %] 96 % (06/29 0415) Weight:  [106.5 kg] 106.5 kg (06/29 0500) Last BM Date : 03/20/24  Intake/Output from previous day: 06/28 0701 - 06/29 0700 In: 1648.9 [I.V.:1397.3; IV Piggyback:251.6] Out: 1850 [Urine:1850] Intake/Output this shift: No intake/output data recorded.  Physical Exam: HEENT - sclerae clear, mucous membranes moist Abdomen - soft, obese; non-tender; no mass  Lab Results:  Recent Labs    03/20/24 0321 03/21/24 0504  WBC 13.7* 12.0*  HGB 14.3 14.4  HCT 42.2 42.7  PLT 210 217   BMET Recent Labs    03/20/24 0321 03/21/24 0504  NA 135 135  K  3.6 3.2*  CL 107 107  CO2 18* 20*  GLUCOSE 107* 110*  BUN 9 6*  CREATININE 0.89 0.72  CALCIUM  8.3* 8.0*   PT/INR No results for input(s): LABPROT, INR in the last 72 hours. Comprehensive Metabolic Panel:    Component Value Date/Time   NA 135 03/21/2024 0504   NA 135 03/20/2024 0321   NA 137 08/11/2019 0000   NA 137 06/22/2018 0000   K 3.2 (L) 03/21/2024 0504   K 3.6 03/20/2024 0321   CL 107 03/21/2024 0504   CL 107 03/20/2024 0321   CO2 20 (L) 03/21/2024 0504   CO2 18 (L) 03/20/2024 0321   BUN 6 (L) 03/21/2024 0504   BUN 9 03/20/2024 0321   BUN 25 01/10/2022 0941   BUN 13 08/11/2019 0000   BUN 13 08/11/2019 0000   CREATININE 0.72 03/21/2024 0504   CREATININE 0.89 03/20/2024 0321   CREATININE 0.97 12/11/2022 1404   CREATININE 1.01 05/27/2014 0945   GLUCOSE 110 (H) 03/21/2024 0504   GLUCOSE 107 (H) 03/20/2024 0321   CALCIUM  8.0 (L) 03/21/2024 0504   CALCIUM  8.3 (L) 03/20/2024 0321   AST 20 03/21/2024 0504   AST 35 03/20/2024 0321   ALT 61 (H) 03/21/2024 0504   ALT 101 (H) 03/20/2024 0321   ALKPHOS 92 03/21/2024 0504   ALKPHOS 92 03/20/2024 0321   BILITOT 2.4 (H) 03/21/2024 0504   BILITOT 3.7 (H) 03/20/2024 0321   PROT 5.5 (L) 03/21/2024 0504   PROT 5.5 (L) 03/20/2024 0321   ALBUMIN  2.3 (L)  03/21/2024 0504   ALBUMIN  2.6 (L) 03/20/2024 0321    Studies/Results: No results found.    Krystal Spinner 03/21/2024  Patient ID: Sean Golden, male   DOB: June 02, 1946, 78 y.o.   MRN: [id number]

## 2024-03-21 NOTE — Anesthesia Postprocedure Evaluation (Signed)
 Anesthesia Post Note  Patient: Sean Golden  Procedure(s) Performed: LAPAROSCOPIC CHOLECYSTECTOMY     Patient location during evaluation: Nursing Unit Anesthesia Type: General Level of consciousness: patient cooperative Pain management: pain level controlled Vital Signs Assessment: post-procedure vital signs reviewed and stable Respiratory status: spontaneous breathing, nonlabored ventilation and respiratory function stable Cardiovascular status: blood pressure returned to baseline and stable Postop Assessment: no apparent nausea or vomiting Anesthetic complications: no   No notable events documented.  Last Vitals:  Vitals:   03/21/24 1215 03/21/24 1230  BP: 101/72 105/70  Pulse: 78 75  Resp: 19 15  Temp:    SpO2: 92% 94%    Last Pain:  Vitals:   03/21/24 1326  TempSrc:   PainSc: 1                  Malaia Buchta

## 2024-03-21 NOTE — Plan of Care (Signed)
   Problem: Education: Goal: Ability to describe self-care measures that may prevent or decrease complications (Diabetes Survival Skills Education) will improve Outcome: Progressing Goal: Individualized Educational Video(s) Outcome: Progressing

## 2024-03-21 NOTE — Transfer of Care (Signed)
 Immediate Anesthesia Transfer of Care Note  Patient: Sean Golden  Procedure(s) Performed: LAPAROSCOPIC CHOLECYSTECTOMY  Patient Location: PACU  Anesthesia Type:General  Level of Consciousness: drowsy  Airway & Oxygen Therapy: Patient Spontanous Breathing  Post-op Assessment: Report given to RN and Post -op Vital signs reviewed and stable  Post vital signs: Reviewed and stable  Last Vitals:  Vitals Value Taken Time  BP 95/66 03/21/24 12:00  Temp 36.4 C 03/21/24 12:00  Pulse 79 03/21/24 12:04  Resp 11 03/21/24 12:04  SpO2 93 % 03/21/24 12:04  Vitals shown include unfiled device data.  Last Pain:  Vitals:   03/21/24 1200  TempSrc:   PainSc: Asleep      Patients Stated Pain Goal: 0 (03/18/24 2234)  Complications: No notable events documented.

## 2024-03-21 NOTE — Progress Notes (Signed)
 Discussed case with Dr. Eletha, and Dr. Leigh yesterday.  Gallstone pancreatitis with choledocholithiasis, however symptoms and labwork have improved.  Now asymptomatic.  Has not undergone ERCP.  Recommended laparoscopic cholecystectomy with intra-operative cholangiogram.  Hopefully will be able to avoid ERCP.  Discussed the procedure, its risks, benefits and alternatives and the patient granted consent to proceed.  Risks discussed risk of infection, bleeding, damage to nearby structure, and postoperative bile leak.  Sean JINNY Foy, MD General, Bariatric and Minimally Invasive Surgery [facility]

## 2024-03-21 NOTE — Progress Notes (Signed)
 PROGRESS NOTE    Sean Golden  FMW:[id number] DOB: 1946-08-22 DOA: 03/17/2024 PCP: [name], MD   Brief Narrative:  78 y.o. male with medical history significant of chronic pancreatitis, persistent atrial fibrillation, essential hypertension, GERD, sleep apnea, obesity presented with abdominal pain and nausea.  On presentation, WBC was 17.7, bicarb of 17, AST of 247, ALT of 126, total bili of 2.5, lipase of 2089.  CT of abdomen and pelvis showed acute pancreatitis with gallstones or polyps with pericholecystic fat stranding.  Right upper upper quadrant ultrasound showed cholelithiasis without acute cholecystitis and hepatic steatosis.  MRI/MRCP of abdomen showed acute interstitial pancreatitis without necrosis with probable choledocholithiasis in the midportion of common bile duct.  He was started on IV fluids and antibiotics and transferred to [facility] as per GI recommendations.  General surgery and cardiology were also consulted.  Assessment & Plan:   Acute on chronic gallstone pancreatitis Cholelithiasis and choledocholithiasis with elevated LFTs E. coli bacteremia Sepsis: Present on admission as evidenced by leukocytosis, tachycardia, E. coli bacteremia -Imaging as above.  Currently on broad-spectrum antibiotics.  Blood cultures growing E. coli: Follow sensitivities. - GI and general surgery following.  Cardiology consulted as per general surgery request for clearance: Patient is low risk for gallbladder surgery as per cardiology.   -LFTs improving with improving WBCs.  GI is of the opinion that patient might have passed the CBD stone.  Patient for possible cholecystectomy with IOC today by general surgery - Continue IV fluids and analgesics/antiemetics as needed  Leukocytosis  - Improving.  Monitor  Hyponatremia - Improved  Hypokalemia - Replace.  Repeat labs  Acute metabolic acidosis - Continue IV fluids.  Monitor  Prediabetes with hyperglycemia - A1c 5.9.   Continue CBGs with SSI  Persistent A-fib - Rate controlled.  Continue Cardizem .  Eliquis  held.  GERD - Continue Protonix  IV  Obesity class I - Outpatient follow-up  DVT prophylaxis: Eliquis  held.  SCDs Code Status: Full Family Communication: Wife and daughter at bedside on 03/20/2024 Disposition Plan: Status is: Inpatient Remains inpatient appropriate because: Of severity of illness  Consultants: GI and general surgery.  Cardiology  Procedures: None  Antimicrobials: Zosyn  from 03/18/2024 onwards   Subjective: Patient seen and examined at bedside.  Denies worsening shortness of breath, fever or vomiting.  Has intermittent abdominal pain.   Objective: Vitals:   03/20/24 1719 03/20/24 2032 03/21/24 0415 03/21/24 0500  BP: 139/80 136/85 138/85   Pulse: 68 69 84   Resp: 16 18 18    Temp: 98.1 F (36.7 C) 98.2 F (36.8 C) 97.7 F (36.5 C)   TempSrc: Oral Oral Oral   SpO2: 96% 96% 96%   Weight:    106.5 kg  Height:        Intake/Output Summary (Last 24 hours) at 03/21/2024 0850 Last data filed at 03/21/2024 0615 Gross per 24 hour  Intake 1648.92 ml  Output 1850 ml  Net -201.08 ml   Filed Weights   03/17/24 1642 03/20/24 0500 03/21/24 0500  Weight: 109.7 kg 109.7 kg 106.5 kg    Examination:  General: No acute distress.  Remains on room air.   ENT/neck: No palpable neck masses or JVD elevation noted  respiratory: Bilateral decreased breath sounds at bases with scattered crackles CVS: Rate mostly controlled; S1 and S2 are heard  abdominal: Soft, obese, distended and mildly tender; no organomegaly,  bowel sounds normally heard Extremities: No clubbing; mild lower extremity edema  CNS: Alert and oriented.  No  focal neurologic deficit.  Will move extremities Lymph: No obvious palpable lymphadenopathy Skin: No obvious petechia/rashes  psych: Normal affect, mood and judgment musculoskeletal: No obvious joint tenderness/deformity    Data Reviewed: I have personally  reviewed following labs and imaging studies  CBC: Recent Labs  Lab 03/17/24 1704 03/18/24 0857 03/19/24 1100 03/19/24 2006 03/20/24 0321 03/21/24 0504  WBC 17.7* 24.4* 16.4* 14.6* 13.7* 12.0*  NEUTROABS 16.2*  --  14.3*  --  11.7* 9.6*  HGB 18.5* 16.7 14.3 14.2 14.3 14.4  HCT 53.5* 49.7 42.8 41.5 42.2 42.7  MCV 94.5 95.0 95.7 95.4 95.3 94.3  PLT 318 280 223 199 210 217   Basic Metabolic Panel: Recent Labs  Lab 03/18/24 0857 03/19/24 0400 03/19/24 2006 03/20/24 0321 03/21/24 0504  NA 135 134* 135 135 135  K 4.4 4.0 3.7 3.6 3.2*  CL 106 104 107 107 107  CO2 15* 20* 19* 18* 20*  GLUCOSE 153* 111* 121* 107* 110*  BUN 18 13 10 9  6*  CREATININE 0.89 1.05 0.98 0.89 0.72  CALCIUM  8.6* 8.5* 8.4* 8.3* 8.0*  MG  --   --   --  1.9 1.8   GFR: Estimated Creatinine Clearance: 97.5 mL/min (by C-G formula based on SCr of 0.72 mg/dL). Liver Function Tests: Recent Labs  Lab 03/18/24 0857 03/19/24 0400 03/19/24 2006 03/20/24 0321 03/21/24 0504  AST 290* 101* 46* 35 20  ALT 295* 177* 117* 101* 61*  ALKPHOS 112 91 87 92 92  BILITOT 7.5* 5.0* 3.9* 3.7* 2.4*  PROT 6.7 5.5* 5.5* 5.5* 5.5*  ALBUMIN  3.5 2.8* 2.7* 2.6* 2.3*   Recent Labs  Lab 03/17/24 1704 03/19/24 1100 03/19/24 2006  LIPASE 2,089* 95* 65*   No results for input(s): AMMONIA in the last 168 hours. Coagulation Profile: No results for input(s): INR, PROTIME in the last 168 hours. Cardiac Enzymes: No results for input(s): CKTOTAL, CKMB, CKMBINDEX, TROPONINI in the last 168 hours. BNP (last 3 results) Recent Labs    10/16/23 1049  PROBNP 150.0*   HbA1C: No results for input(s): HGBA1C in the last 72 hours.  CBG: Recent Labs  Lab 03/20/24 0803 03/20/24 1215 03/20/24 1714 03/20/24 2029 03/21/24 0840  GLUCAP 138* 107* 122* 127* 119*   Lipid Profile: Recent Labs    03/18/24 0919  CHOL 148  HDL 39*  LDLCALC 97  TRIG 61  CHOLHDL 3.8   Thyroid  Function Tests: No results for  input(s): TSH, T4TOTAL, FREET4, T3FREE, THYROIDAB in the last 72 hours. Anemia Panel: No results for input(s): VITAMINB12, FOLATE, FERRITIN, TIBC, IRON, RETICCTPCT in the last 72 hours. Sepsis Labs: Recent Labs  Lab 03/18/24 0857  PROCALCITON 37.18    Recent Results (from the past 240 hours)  Culture, blood (Routine X 2) w Reflex to ID Panel     Status: None (Preliminary result)   Collection Time: 03/18/24  8:57 AM   Specimen: BLOOD  Result Value Ref Range Status   Specimen Description BLOOD BLOOD LEFT ARM  Final   Special Requests   Final    BOTTLES DRAWN AEROBIC AND ANAEROBIC Blood Culture results may not be optimal due to an inadequate volume of blood received in culture bottles   Culture   Final    NO GROWTH 3 DAYS Performed at [facility], [address], [place], [place] [address]    Report Status PENDING  Incomplete  Culture, blood (Routine X 2) w Reflex to ID Panel     Status: Abnormal (Preliminary result)  Collection Time: 03/18/24  9:12 AM   Specimen: BLOOD  Result Value Ref Range Status   Specimen Description   Final    BLOOD BLOOD LEFT HAND Performed at [facility], [address], [place], [place] [address]    Special Requests   Final    BOTTLES DRAWN AEROBIC AND ANAEROBIC Blood Culture results may not be optimal due to an inadequate volume of blood received in culture bottles Performed at [facility], [address], [place], [place] [address]    Culture  Setup Time   Final    GRAM NEGATIVE RODS IN BOTH AEROBIC AND ANAEROBIC BOTTLES Gram Stain Report Called to,Read Back By and Verified With: LEVORN LEEDS, RN [facility], [phone], AT 2238 03/18/24 BY A. SNYDER CRITICAL RESULT CALLED TO, READ BACK BY AND VERIFIED WITH: PHARMD G ABBOTT 03/19/2024 @ 0418 BY AB    Culture (A)  Final    ESCHERICHIA COLI CULTURE REINCUBATED FOR BETTER GROWTH Performed at [facility], [address], [place], [place] [address]    Report Status PENDING   Incomplete  Blood Culture ID Panel (Reflexed)     Status: Abnormal   Collection Time: 03/18/24  9:12 AM  Result Value Ref Range Status   Enterococcus faecalis NOT DETECTED NOT DETECTED Final   Enterococcus Faecium NOT DETECTED NOT DETECTED Final   Listeria monocytogenes NOT DETECTED NOT DETECTED Final   Staphylococcus species NOT DETECTED NOT DETECTED Final   Staphylococcus aureus (BCID) NOT DETECTED NOT DETECTED Final   Staphylococcus epidermidis NOT DETECTED NOT DETECTED Final   Staphylococcus lugdunensis NOT DETECTED NOT DETECTED Final   Streptococcus species NOT DETECTED NOT DETECTED Final   Streptococcus agalactiae NOT DETECTED NOT DETECTED Final   Streptococcus pneumoniae NOT DETECTED NOT DETECTED Final   Streptococcus pyogenes NOT DETECTED NOT DETECTED Final   A.calcoaceticus-baumannii NOT DETECTED NOT DETECTED Final   Bacteroides fragilis NOT DETECTED NOT DETECTED Final   Enterobacterales DETECTED (A) NOT DETECTED Final    Comment: Enterobacterales represent a large order of gram negative bacteria, not a single organism. CRITICAL RESULT CALLED TO, READ BACK BY AND VERIFIED WITH: PHARMD G ABBOTT 03/19/2024 @ 0418 BY AB    Enterobacter cloacae complex NOT DETECTED NOT DETECTED Final   Escherichia coli DETECTED (A) NOT DETECTED Final    Comment: CRITICAL RESULT CALLED TO, READ BACK BY AND VERIFIED WITH: PHARMD G ABBOTT 03/19/2024 @ 0418 BY AB    Klebsiella aerogenes NOT DETECTED NOT DETECTED Final   Klebsiella oxytoca NOT DETECTED NOT DETECTED Final   Klebsiella pneumoniae NOT DETECTED NOT DETECTED Final   Proteus species NOT DETECTED NOT DETECTED Final   Salmonella species NOT DETECTED NOT DETECTED Final   Serratia marcescens NOT DETECTED NOT DETECTED Final   Haemophilus influenzae NOT DETECTED NOT DETECTED Final   Neisseria meningitidis NOT DETECTED NOT DETECTED Final   Pseudomonas aeruginosa NOT DETECTED NOT DETECTED Final   Stenotrophomonas maltophilia NOT DETECTED NOT  DETECTED Final   Candida albicans NOT DETECTED NOT DETECTED Final   Candida auris NOT DETECTED NOT DETECTED Final   Candida glabrata NOT DETECTED NOT DETECTED Final   Candida krusei NOT DETECTED NOT DETECTED Final   Candida parapsilosis NOT DETECTED NOT DETECTED Final   Candida tropicalis NOT DETECTED NOT DETECTED Final   Cryptococcus neoformans/gattii NOT DETECTED NOT DETECTED Final   CTX-M ESBL NOT DETECTED NOT DETECTED Final   Carbapenem resistance IMP NOT DETECTED NOT DETECTED Final   Carbapenem resistance KPC NOT DETECTED NOT DETECTED Final  Carbapenem resistance NDM NOT DETECTED NOT DETECTED Final   Carbapenem resist OXA 48 LIKE NOT DETECTED NOT DETECTED Final   Carbapenem resistance VIM NOT DETECTED NOT DETECTED Final    Comment: Performed at [facility], [address], [place], [place] [address]  MRSA Next Gen by PCR, Nasal     Status: None   Collection Time: 03/20/24  8:29 PM   Specimen: Nasal Mucosa; Nasal Swab  Result Value Ref Range Status   MRSA by PCR Next Gen NOT DETECTED NOT DETECTED Final    Comment: (NOTE) The GeneXpert MRSA Assay (FDA approved for NASAL specimens only), is one component of a comprehensive MRSA colonization surveillance program. It is not intended to diagnose MRSA infection nor to guide or monitor treatment for MRSA infections. Test performance is not FDA approved in patients less than 44 years old. Performed at [facility], [address] [place], [place] [address]          Radiology Studies: No results found.       Scheduled Meds:  Chlorhexidine  Gluconate Cloth  6 each Topical Daily   diltiazem   300 mg Oral Daily   insulin  aspart  0-15 Units Subcutaneous TID WC   pantoprazole  (PROTONIX ) IV  40 mg Intravenous Q12H   sodium chloride  flush  10-40 mL Intracatheter Q12H   Continuous Infusions:  sodium chloride  125 mL/hr at 03/21/24 0800   chlorproMAZINE  (THORAZINE ) 12.5 mg in sodium chloride  0.9 % 25 mL IVPB Stopped  (03/20/24 2038)   piperacillin -tazobactam (ZOSYN )  IV 12.5 mL/hr at 03/21/24 9384          Sophie Mao, MD [facility] 03/21/2024, 8:50 AM

## 2024-03-21 NOTE — Anesthesia Preprocedure Evaluation (Addendum)
 Anesthesia Evaluation  Patient identified by MRN, date of birth, ID band Patient awake    Reviewed: Allergy & Precautions, NPO status , Patient's Chart, lab work & pertinent test results  History of Anesthesia Complications Negative for: history of anesthetic complications  Airway Mallampati: III  TM Distance: >3 FB Neck ROM: Full    Dental  (+) Teeth Intact, Dental Advisory Given   Pulmonary sleep apnea , neg COPD, former smoker   breath sounds clear to auscultation       Cardiovascular hypertension, Pt. on medications  Rhythm:Regular  1. Left ventricular ejection fraction, by estimation, is 60 to 65%. The  left ventricle has normal function. The left ventricle has no regional  wall motion abnormalities. There is moderate left ventricular hypertrophy.  Left ventricular diastolic  parameters are indeterminate.   2. Right ventricular systolic function is normal. The right ventricular  size is normal.   3. Left atrial size was severely dilated.   4. Right atrial size was moderately dilated.   5. The mitral valve is normal in structure. Trivial mitral valve  regurgitation. No evidence of mitral stenosis.   6. The aortic valve is tricuspid. Aortic valve regurgitation is not  visualized. No aortic stenosis is present.   7. The inferior vena cava is normal in size with greater than 50%  respiratory variability, suggesting right atrial pressure of 3 mmHg.     Neuro/Psych    GI/Hepatic ,GERD  ,,GALLSTONE  Lab Results      Component                Value               Date                      ALT                      61 (H)              03/21/2024                AST                      20                  03/21/2024                ALKPHOS                  92                  03/21/2024                BILITOT                  2.4 (H)             03/21/2024              Endo/Other  negative endocrine ROS  Lab Results       Component                Value               Date                      HGBA1C  5.6                 03/17/2024             Renal/GU negative Renal ROSLab Results      Component                Value               Date                      NA                       135                 03/21/2024                K                        3.2 (L)             03/21/2024                CO2                      20 (L)              03/21/2024                GLUCOSE                  110 (H)             03/21/2024                BUN                      6 (L)               03/21/2024                CREATININE               0.72                03/21/2024                CALCIUM                   8.0 (L)             03/21/2024                EGFR                     95                  01/10/2022                GFRNONAA                 >60                 03/21/2024                Musculoskeletal negative musculoskeletal ROS (+)    Abdominal   Peds  Hematology negative hematology ROS (+) Lab Results      Component                Value  Date                      WBC                      12.0 (H)            03/21/2024                HGB                      14.4                03/21/2024                HCT                      42.7                03/21/2024                MCV                      94.3                03/21/2024                PLT                      217                 03/21/2024              Anesthesia Other Findings   Reproductive/Obstetrics                             Anesthesia Physical Anesthesia Plan  ASA: 2  Anesthesia Plan: General   Post-op Pain Management: Ofirmev  IV (intra-op)* and Toradol IV (intra-op)*   Induction: Intravenous  PONV Risk Score and Plan: 3 and Ondansetron  and Dexamethasone   Airway Management Planned: Oral ETT  Additional Equipment: None  Intra-op Plan:   Post-operative Plan:  Extubation in OR  Informed Consent: I have reviewed the patients History and Physical, chart, labs and discussed the procedure including the risks, benefits and alternatives for the proposed anesthesia with the patient or authorized representative who has indicated his/her understanding and acceptance.     Dental advisory given  Plan Discussed with: CRNA  Anesthesia Plan Comments:         Anesthesia Quick Evaluation

## 2024-03-21 NOTE — Anesthesia Procedure Notes (Signed)
 Procedure Name: Intubation Date/Time: 03/21/2024 10:33 AM  Performed by: Loreli Blima LABOR, CRNAPre-anesthesia Checklist: Patient identified, Emergency Drugs available, Suction available and Patient being monitored Patient Re-evaluated:Patient Re-evaluated prior to induction Oxygen Delivery Method: Circle System Utilized Preoxygenation: Pre-oxygenation with 100% oxygen Induction Type: IV induction Ventilation: Mask ventilation without difficulty Laryngoscope Size: 4 and Mac Grade View: Grade I Tube type: Oral Tube size: 7.5 mm Number of attempts: 1 Airway Equipment and Method: Stylet Placement Confirmation: ETT inserted through vocal cords under direct vision, positive ETCO2 and breath sounds checked- equal and bilateral Secured at: 23 cm Tube secured with: Tape Dental Injury: Teeth and Oropharynx as per pre-operative assessment

## 2024-03-22 ENCOUNTER — Inpatient Hospital Stay (HOSPITAL_COMMUNITY)

## 2024-03-22 ENCOUNTER — Encounter (HOSPITAL_COMMUNITY): Payer: Self-pay | Admitting: Surgery

## 2024-03-22 DIAGNOSIS — K859 Acute pancreatitis without necrosis or infection, unspecified: Secondary | ICD-10-CM | POA: Diagnosis not present

## 2024-03-22 DIAGNOSIS — Z9049 Acquired absence of other specified parts of digestive tract: Secondary | ICD-10-CM

## 2024-03-22 DIAGNOSIS — K861 Other chronic pancreatitis: Secondary | ICD-10-CM | POA: Diagnosis not present

## 2024-03-22 LAB — COMPREHENSIVE METABOLIC PANEL WITH GFR
ALT: 54 U/L — ABNORMAL HIGH (ref 0–44)
AST: 28 U/L (ref 15–41)
Albumin: 2.6 g/dL — ABNORMAL LOW (ref 3.5–5.0)
Alkaline Phosphatase: 101 U/L (ref 38–126)
Anion gap: 10 (ref 5–15)
BUN: 8 mg/dL (ref 8–23)
CO2: 17 mmol/L — ABNORMAL LOW (ref 22–32)
Calcium: 8.2 mg/dL — ABNORMAL LOW (ref 8.9–10.3)
Chloride: 104 mmol/L (ref 98–111)
Creatinine, Ser: 0.74 mg/dL (ref 0.61–1.24)
GFR, Estimated: >60 mL/min (ref >60)
Glucose, Bld: 175 mg/dL — ABNORMAL HIGH (ref 70–99)
Potassium: 3.9 mmol/L (ref 3.5–5.1)
Sodium: 131 mmol/L — ABNORMAL LOW (ref 135–145)
Total Bilirubin: 2 mg/dL — ABNORMAL HIGH (ref 0.0–1.2)
Total Protein: 6 g/dL — ABNORMAL LOW (ref 6.5–8.1)

## 2024-03-22 LAB — CBC WITH DIFFERENTIAL/PLATELET
Abs Immature Granulocytes: 0.32 10*3/uL — ABNORMAL HIGH (ref 0.00–0.07)
Basophils Absolute: 0.1 10*3/uL (ref 0.0–0.1)
Basophils Relative: 1 %
Eosinophils Absolute: 0 10*3/uL (ref 0.0–0.5)
Eosinophils Relative: 0 %
HCT: 43.7 % (ref 39.0–52.0)
Hemoglobin: 15.1 g/dL (ref 13.0–17.0)
Immature Granulocytes: 3 %
Lymphocytes Relative: 7 %
Lymphs Abs: 1 10*3/uL (ref 0.7–4.0)
MCH: 32.1 pg (ref 26.0–34.0)
MCHC: 34.6 g/dL (ref 30.0–36.0)
MCV: 93 fL (ref 80.0–100.0)
Monocytes Absolute: 0.8 10*3/uL (ref 0.1–1.0)
Monocytes Relative: 6 %
Neutro Abs: 10.8 10*3/uL — ABNORMAL HIGH (ref 1.7–7.7)
Neutrophils Relative %: 83 %
Platelets: 242 10*3/uL (ref 150–400)
RBC: 4.7 MIL/uL (ref 4.22–5.81)
RDW: 12.7 % (ref 11.5–15.5)
WBC: 12.9 10*3/uL — ABNORMAL HIGH (ref 4.0–10.5)
nRBC: 0 % (ref 0.0–0.2)

## 2024-03-22 LAB — GLUCOSE, CAPILLARY
Glucose-Capillary: 170 mg/dL — ABNORMAL HIGH (ref 70–99)
Glucose-Capillary: 138 mg/dL — ABNORMAL HIGH (ref 70–99)
Glucose-Capillary: 148 mg/dL — ABNORMAL HIGH (ref 70–99)
Glucose-Capillary: 159 mg/dL — ABNORMAL HIGH (ref 70–99)

## 2024-03-22 LAB — MAGNESIUM: Magnesium: 2 mg/dL (ref 1.7–2.4)

## 2024-03-22 MED ORDER — AMOXICILLIN-POT CLAVULANATE 875-125 MG PO TABS: 1.0000 | ORAL_TABLET | Freq: Two times a day (BID) | ORAL | 0 refills | Status: DC

## 2024-03-22 MED ORDER — OXYCODONE HCL 5 MG PO TABS: 5.0000 mg | ORAL_TABLET | Freq: Four times a day (QID) | ORAL | 0 refills | Status: DC | PRN

## 2024-03-22 MED ORDER — MELATONIN 5 MG PO TABS
5.0000 mg | ORAL_TABLET | Freq: Every evening | ORAL | Status: DC | PRN
  Administered 2024-03-22: 5 mg via ORAL
  Filled 2024-03-22: qty 1

## 2024-03-22 MED ORDER — ONDANSETRON HCL 4 MG PO TABS: 4.0000 mg | ORAL_TABLET | Freq: Three times a day (TID) | ORAL | 0 refills | Status: DC | PRN

## 2024-03-22 MED ORDER — SODIUM BICARBONATE 650 MG PO TABS
650.0000 mg | ORAL_TABLET | Freq: Three times a day (TID) | ORAL | Status: DC
  Administered 2024-03-22 – 2024-03-23 (×2): 650 mg via ORAL
  Filled 2024-03-22 (×2): qty 1

## 2024-03-22 MED ORDER — SODIUM CHLORIDE 0.9% FLUSH: 10.0000 mL | INTRAVENOUS | Status: DC | PRN

## 2024-03-22 MED ORDER — GADOBUTROL 1 MMOL/ML IV SOLN
10.0000 mL | Freq: Once | INTRAVENOUS | Status: AC | PRN
  Administered 2024-03-22: 10 mL via INTRAVENOUS

## 2024-03-22 NOTE — Progress Notes (Signed)
 Subjective: Seen today with wife at bedside.  Patient has minimal pain around incisions, has no complaint of nausea vomiting, but denies having a bowel movement however endorses having flatus.  Patient is tolerating regular diet well. Patient is in no acute distress and is hemodynamically stable.  Objective: Vital signs in last 24 hours: Temp:  [97.6 F (36.4 C)-98.4 F (36.9 C)] 97.8 F (36.6 C) (06/30 0740) Pulse Rate:  [63-81] 63 (06/30 0740) Resp:  [15-20] 17 (06/30 0740) BP: (95-151)/(66-96) 151/96 (06/30 0740) SpO2:  [92 %-98 %] 98 % (06/30 0740) Weight:  [106.5 kg] 106.5 kg (06/29 0948) Last BM Date : 03/19/24  Intake/Output from previous day: 06/29 0701 - 06/30 0700 In: 750 [I.V.:750] Out: 50 [Blood:50] Intake/Output this shift: Total I/O In: 220 [P.O.:220] Out: -   Physical Exam: HEENT - sclerae clear, mucous membranes moist Abdomen - soft, obese; non-tender; incision sites clean dry and intact without signs of infection, minimal tenderness around incisions.  Lab Results:  Recent Labs    03/20/24 0321 03/21/24 0504  WBC 13.7* 12.0*  HGB 14.3 14.4  HCT 42.2 42.7  PLT 210 217   BMET Recent Labs    03/20/24 0321 03/21/24 0504  NA 135 135  K 3.6 3.2*  CL 107 107  CO2 18* 20*  GLUCOSE 107* 110*  BUN 9 6*  CREATININE 0.89 0.72  CALCIUM  8.3* 8.0*   PT/INR No results for input(s): LABPROT, INR in the last 72 hours. Comprehensive Metabolic Panel:    Component Value Date/Time   NA 135 03/21/2024 0504   NA 135 03/20/2024 0321   NA 137 08/11/2019 0000   NA 137 06/22/2018 0000   K 3.2 (L) 03/21/2024 0504   K 3.6 03/20/2024 0321   CL 107 03/21/2024 0504   CL 107 03/20/2024 0321   CO2 20 (L) 03/21/2024 0504   CO2 18 (L) 03/20/2024 0321   BUN 6 (L) 03/21/2024 0504   BUN 9 03/20/2024 0321   BUN 25 01/10/2022 0941   BUN 13 08/11/2019 0000   BUN 13 08/11/2019 0000   CREATININE 0.72 03/21/2024 0504   CREATININE 0.89 03/20/2024 0321    CREATININE 0.97 12/11/2022 1404   CREATININE 1.01 05/27/2014 0945   GLUCOSE 110 (H) 03/21/2024 0504   GLUCOSE 107 (H) 03/20/2024 0321   CALCIUM  8.0 (L) 03/21/2024 0504   CALCIUM  8.3 (L) 03/20/2024 0321   AST 20 03/21/2024 0504   AST 35 03/20/2024 0321   ALT 61 (H) 03/21/2024 0504   ALT 101 (H) 03/20/2024 0321   ALKPHOS 92 03/21/2024 0504   ALKPHOS 92 03/20/2024 0321   BILITOT 2.4 (H) 03/21/2024 0504   BILITOT 3.7 (H) 03/20/2024 0321   PROT 5.5 (L) 03/21/2024 0504   PROT 5.5 (L) 03/20/2024 0321   ALBUMIN  2.3 (L) 03/21/2024 0504   ALBUMIN  2.6 (L) 03/20/2024 0321    Studies/Results: No results found.   Assessment & Plan: POD 1-  LAPAROSCOPIC CHOLECYSTECTOMY, 6/29, Dr. Stechschulte  - CT A/P 6/25 w/ acute pancreatitis, gallstones or polyps with pericholecystitic fat stranding - RUQ US  6/26 w/ Cholelithiasis without evidence of acute cholecystitis. CBD 5.3 mm. Portal vein patent.  - MRCP 6/26 w/ acute interstitial pancreatitis without pancreatic necrosis or peripancreatic/intrapancreatic collections. There was evidence of choledocholithiasis in the midportion of common bile duct. Also noted, small volume cholelithiasis without acute cholecystitis changes.  - WBC 12.9K today, slightly more elevated than yesterday but stable.  Will continue to monitor - Tbili 2.0, improved  likely since yesterday, LFTs trending down. - Cardiology evaluation - low risk for OR, holding Eliquis  since Wed 6/25 - symptomatically improved -Multimodal pain management.    FEN - Regular  VTE - SCDs, hold Eliquis  ID - On Zosyn     Sean Hammonds, PA-C 03/22/2024  Patient ID: Sean Golden, male   DOB: 1946/02/01, 78 y.o.   MRN: [id number]

## 2024-03-22 NOTE — Progress Notes (Signed)
 MRCP results available to read. Niels BROCKS Hall, DO notified.

## 2024-03-22 NOTE — Progress Notes (Signed)
   03/22/24 1038  TOC Brief Assessment  Insurance and Status Reviewed  Patient has primary care physician Yes  Home environment has been reviewed spouse  Prior level of function: independent  Prior/Current Home Services No current home services  Social Drivers of Health Review SDOH reviewed no interventions necessary  Readmission risk has been reviewed Yes  Transition of care needs no transition of care needs at this time     Transition of Care Department [facility]) has reviewed patient and no TOC needs have been identified at this time. We will continue to monitor patient advancement through interdisciplinary progression rounds. If new patient transition needs arise, please place a TOC consult.

## 2024-03-22 NOTE — Plan of Care (Signed)
  Problem: Coping: Goal: Ability to adjust to condition or change in health will improve Outcome: Progressing   Problem: Fluid Volume: Goal: Ability to maintain a balanced intake and output will improve Outcome: Progressing   Problem: Clinical Measurements: Goal: Will remain free from infection Outcome: Progressing

## 2024-03-22 NOTE — Progress Notes (Signed)
 Patient ID: Sean Golden, male   DOB: 07/24/1946, 78 y.o.   MRN: [id number]    Progress Note   Subjective   Day # 4 CC; gallstone pancreatitis  Eliquis  on hold IV Zosyn   Status post laparoscopic cholecystectomy yesterday-severe adhesions in the right upper quadrant surrounding the gallbladder inflamed gallbladder with short cystic duct anatomy did not allow cholangiogram to be performed safely.  MRCP 03/18/2024 had shown acute interstitial pancreatitis, no peripancreatic or intra pancreatic collections, there was evidence of choledocholithiasis in the midportion of the common bile duct  Labs today-WBC 12.9/hemoglobin 15.1/hematocrit 43.7 Sodium 131/potassium 3.9/BUN 8/creatinine 0.74 T. bili 2.0/alk phos 101/AST 28/ALT 54 significantly improved  Patient says he feels fine after surgery, some mild soreness with ambulating but no significant pain no nausea or vomiting, able to tolerate solid food     Objective   Vital signs in last 24 hours: Temp:  [97.6 F (36.4 C)-98 F (36.7 C)] 97.8 F (36.6 C) (06/30 0740) Pulse Rate:  [63-81] 63 (06/30 0740) Resp:  [15-20] 17 (06/30 0740) BP: (95-151)/(66-96) 151/96 (06/30 0740) SpO2:  [92 %-98 %] 98 % (06/30 0740) Last BM Date : 03/19/24 General:    Elderly white male in NAD early icterus Heart:  Regular rate and rhythm; no murmurs Lungs: Respirations even and unlabored, lungs CTA bilaterally Abdomen:  Soft, nontender and nondistended. Normal bowel sounds. Extremities:  Without edema. Neurologic:  Alert and oriented,  grossly normal neurologically. Psych:  Cooperative. Normal mood and affect.  Intake/Output from previous day: 06/29 0701 - 06/30 0700 In: 750 [I.V.:750] Out: 50 [Blood:50] Intake/Output this shift: Total I/O In: 220 [P.O.:220] Out: -   Lab Results: Recent Labs    03/20/24 0321 03/21/24 0504 03/22/24 0643  WBC 13.7* 12.0* 12.9*  HGB 14.3 14.4 15.1  HCT 42.2 42.7 43.7  PLT 210 217 242   BMET Recent  Labs    03/20/24 0321 03/21/24 0504 03/22/24 0643  NA 135 135 131*  K 3.6 3.2* 3.9  CL 107 107 104  CO2 18* 20* 17*  GLUCOSE 107* 110* 175*  BUN 9 6* 8  CREATININE 0.89 0.72 0.74  CALCIUM  8.3* 8.0* 8.2*   LFT Recent Labs    03/22/24 0643  PROT 6.0*  ALBUMIN  2.6*  AST 28  ALT 54*  ALKPHOS 101  BILITOT 2.0*   PT/INR No results for input(s): LABPROT, INR in the last 72 hours.      Assessment / Plan:    #62  78 year old white male admitted 4 days ago with acute gallstone pancreatitis, found to have E. coli bacteremia, and choledocholithiasis on MRCP.  Patient is status post laparoscopic cholecystectomy yesterday with finding of significant adhesions in the right upper quadrant surrounding the gallbladder, inflamed gallbladder and short cystic duct and anatomy did not allow for IOC  Patient feels fine postoperatively, tolerating solid food. LFTs have trended down nicely  Patient had positive MRCP for mid common bile duct stone preoperatively, and as IOC was not able to be done, presumably he still has a common bile duct stone  Plan; Have scheduled for repeat MRI/MRCP to confirm whether or not mid common bile duct stone still present in the setting of improved LFTs.  Further recommendations pending findings at MRCP. Plans were discussed with the patient and family at bedside Repeat LFTs in a.m. N.p.o. past midnight in event MRCP positive    Principal Problem:   Acute on chronic pancreatitis (HCC) Active Problems:   GERD (gastroesophageal reflux disease)  Leukocytosis   Obesity, Class I, BMI 30-34.9   Hyperglycemia   Persistent atrial fibrillation (HCC)   Acute gallstone pancreatitis   Choledocholithiasis     LOS: 4 days   Keiden Deskin  03/22/2024, 11:39 AM

## 2024-03-22 NOTE — Progress Notes (Signed)
 PROGRESS NOTE    Sean Golden  FMW:[id number] DOB: [birth year] DOA: 03/17/2024 PCP: [name], MD   Brief Narrative:  78 y.o. male with medical history significant of chronic pancreatitis, persistent atrial fibrillation, essential hypertension, GERD, sleep apnea, obesity presented with abdominal pain and nausea.  On presentation, WBC was 17.7, bicarb of 17, AST of 247, ALT of 126, total bili of 2.5, lipase of 2089.  CT of abdomen and pelvis showed acute pancreatitis with gallstones or polyps with pericholecystic fat stranding.  Right upper upper quadrant ultrasound showed cholelithiasis without acute cholecystitis and hepatic steatosis.  MRI/MRCP of abdomen showed acute interstitial pancreatitis without necrosis with probable choledocholithiasis in the midportion of common bile duct.  He was started on IV fluids and antibiotics and transferred to [facility] as per GI recommendations.  General surgery and cardiology were also consulted.  Assessment & Plan:   Acute on chronic gallstone pancreatitis Cholelithiasis and choledocholithiasis with elevated LFTs E. coli bacteremia Sepsis: Present on admission as evidenced by leukocytosis, tachycardia, E. coli bacteremia -Imaging as above.  Currently on broad-spectrum antibiotics.  Blood cultures growing E. coli - GI and general surgery following.  Underwent lap chole on 03/21/2024 by general surgery. -LFTs improving.  WBCs 12.9 today.  DC IV fluids.  Leukocytosis  - Improving.  Monitor  Hyponatremia - Mild.  Monitor.  Hypokalemia - Improved  Acute metabolic acidosis - Start oral sodium bicarbonate  tablets.  Repeat a.m. labs.  Prediabetes with hyperglycemia - A1c 5.9.  Continue CBGs with SSI  Persistent A-fib - Rate controlled.  Continue Cardizem .  Eliquis  held.  GERD - Continue Protonix  IV  Obesity class I - Outpatient follow-up  DVT prophylaxis: Eliquis  held.  SCDs Code Status: Full Family Communication: Wife at  bedside on 03/20/2024 Disposition Plan: Status is: Inpatient Remains inpatient appropriate because: Of severity of illness  Consultants: GI and general surgery.  Cardiology  Procedures: None  Antimicrobials: Zosyn  from 03/18/2024 onwards   Subjective: Patient seen and examined at bedside.  Denies worsening abdominal pain, fever or vomiting. Objective: Vitals:   03/21/24 1744 03/21/24 2103 03/22/24 0611 03/22/24 0740  BP: 138/88 (!) 147/90 (!) 150/91 (!) 151/96  Pulse: 76 80 80 63  Resp: 20 16 16 17   Temp: 98 F (36.7 C) 98 F (36.7 C) 97.9 F (36.6 C) 97.8 F (36.6 C)  TempSrc: Oral Oral Oral Oral  SpO2: 95% 95% 98% 98%  Weight:      Height:        Intake/Output Summary (Last 24 hours) at 03/22/2024 1313 Last data filed at 03/22/2024 0737 Gross per 24 hour  Intake 220 ml  Output --  Net 220 ml   Filed Weights   03/20/24 0500 03/21/24 0500 03/21/24 0948  Weight: 109.7 kg 106.5 kg 106.5 kg    Examination:  General: Currently on room air.  No distress. ENT/neck: No JVD elevation or palpable thyromegaly respiratory: Decreased breath sounds at bases bilaterally with some crackles CVS: S1-S2 heard; currently rate controlled abdominal: Soft, obese, mildly distended and tender; no organomegaly, normal bowel sounds heard  extremities: Trace lower extremity edema present; no cyanosis  CNS: Awake and alert.  No focal deficits noted  lymph: No lymphadenopathy palpable  skin: No obvious muscle/rashes psych: Mostly flat affect.  Not agitated. musculoskeletal: No obvious joint swelling/erythema    Data Reviewed: I have personally reviewed following labs and imaging studies  CBC: Recent Labs  Lab 03/17/24 1704 03/18/24 0857 03/19/24 1100 03/19/24 2006 03/20/24 0321  03/21/24 0504 03/22/24 0643  WBC 17.7*   < > 16.4* 14.6* 13.7* 12.0* 12.9*  NEUTROABS 16.2*  --  14.3*  --  11.7* 9.6* 10.8*  HGB 18.5*   < > 14.3 14.2 14.3 14.4 15.1  HCT 53.5*   < > 42.8 41.5 42.2  42.7 43.7  MCV 94.5   < > 95.7 95.4 95.3 94.3 93.0  PLT 318   < > 223 199 210 217 242   < > = values in this interval not displayed.   Basic Metabolic Panel: Recent Labs  Lab 03/19/24 0400 03/19/24 2006 03/20/24 0321 03/21/24 0504 03/22/24 0643  NA 134* 135 135 135 131*  K 4.0 3.7 3.6 3.2* 3.9  CL 104 107 107 107 104  CO2 20* 19* 18* 20* 17*  GLUCOSE 111* 121* 107* 110* 175*  BUN 13 10 9  6* 8  CREATININE 1.05 0.98 0.89 0.72 0.74  CALCIUM  8.5* 8.4* 8.3* 8.0* 8.2*  MG  --   --  1.9 1.8 2.0   GFR: Estimated Creatinine Clearance: 97.5 mL/min (by C-G formula based on SCr of 0.74 mg/dL). Liver Function Tests: Recent Labs  Lab 03/19/24 0400 03/19/24 2006 03/20/24 0321 03/21/24 0504 03/22/24 0643  AST 101* 46* 35 20 28  ALT 177* 117* 101* 61* 54*  ALKPHOS 91 87 92 92 101  BILITOT 5.0* 3.9* 3.7* 2.4* 2.0*  PROT 5.5* 5.5* 5.5* 5.5* 6.0*  ALBUMIN  2.8* 2.7* 2.6* 2.3* 2.6*   Recent Labs  Lab 03/17/24 1704 03/19/24 1100 03/19/24 2006  LIPASE 2,089* 95* 65*   No results for input(s): AMMONIA in the last 168 hours. Coagulation Profile: No results for input(s): INR, PROTIME in the last 168 hours. Cardiac Enzymes: No results for input(s): CKTOTAL, CKMB, CKMBINDEX, TROPONINI in the last 168 hours. BNP (last 3 results) Recent Labs    10/16/23 1049  PROBNP 150.0*   HbA1C: No results for input(s): HGBA1C in the last 72 hours.  CBG: Recent Labs  Lab 03/21/24 1249 03/21/24 1730 03/21/24 2053 03/22/24 0739 03/22/24 1243  GLUCAP 149* 200* 166* 170* 138*   Lipid Profile: No results for input(s): CHOL, HDL, LDLCALC, TRIG, CHOLHDL, LDLDIRECT in the last 72 hours.  Thyroid  Function Tests: No results for input(s): TSH, T4TOTAL, FREET4, T3FREE, THYROIDAB in the last 72 hours. Anemia Panel: No results for input(s): VITAMINB12, FOLATE, FERRITIN, TIBC, IRON, RETICCTPCT in the last 72 hours. Sepsis Labs: Recent Labs  Lab  03/18/24 0857  PROCALCITON 37.18    Recent Results (from the past 240 hours)  Culture, blood (Routine X 2) w Reflex to ID Panel     Status: None (Preliminary result)   Collection Time: 03/18/24  8:57 AM   Specimen: BLOOD  Result Value Ref Range Status   Specimen Description BLOOD BLOOD LEFT ARM  Final   Special Requests   Final    BOTTLES DRAWN AEROBIC AND ANAEROBIC Blood Culture results may not be optimal due to an inadequate volume of blood received in culture bottles   Culture   Final    NO GROWTH 3 DAYS Performed at [facility], [address] [place], [place] [address]    Report Status PENDING  Incomplete  Culture, blood (Routine X 2) w Reflex to ID Panel     Status: Abnormal (Preliminary result)   Collection Time: 03/18/24  9:12 AM   Specimen: BLOOD  Result Value Ref Range Status   Specimen Description   Final    BLOOD BLOOD LEFT HAND Performed at [facility], [address] [place], [place] [address]    Special Requests   Final    BOTTLES DRAWN AEROBIC AND ANAEROBIC Blood Culture results may not be optimal due to an inadequate volume of blood received in culture bottles Performed at [facility], [address] [place], [place] [address]    Culture  Setup Time   Final    GRAM NEGATIVE RODS IN BOTH AEROBIC AND ANAEROBIC BOTTLES Gram Stain Report Called to,Read Back By and Verified With: LEVORN LEEDS, RN Operating Room Services, [phone], AT 2238 03/18/24 BY A. SNYDER CRITICAL RESULT CALLED TO, READ BACK BY AND VERIFIED WITH: PHARMD G ABBOTT 03/19/2024 @ 0418 BY AB    Culture (A)  Final    ESCHERICHIA COLI CEFAZOLIN  TO FALLOW Performed at [facility], [address], [place], [place] [address]    Report Status PENDING  Incomplete   Organism ID, Bacteria ESCHERICHIA COLI  Final      Susceptibility   Escherichia coli - MIC*    AMPICILLIN  <=2 SENSITIVE Sensitive     CEFEPIME <=0.12 SENSITIVE Sensitive     CEFTAZIDIME <=1 SENSITIVE Sensitive     CEFTRIAXONE <=0.25  SENSITIVE Sensitive     CIPROFLOXACIN <=0.25 SENSITIVE Sensitive     GENTAMICIN 2 SENSITIVE Sensitive     IMIPENEM <=0.25 SENSITIVE Sensitive     TRIMETH/SULFA <=20 SENSITIVE Sensitive     AMPICILLIN /SULBACTAM <=2 SENSITIVE Sensitive     PIP/TAZO <=4 SENSITIVE Sensitive ug/mL    * ESCHERICHIA COLI  Blood Culture ID Panel (Reflexed)     Status: Abnormal   Collection Time: 03/18/24  9:12 AM  Result Value Ref Range Status   Enterococcus faecalis NOT DETECTED NOT DETECTED Final   Enterococcus Faecium NOT DETECTED NOT DETECTED Final   Listeria monocytogenes NOT DETECTED NOT DETECTED Final   Staphylococcus species NOT DETECTED NOT DETECTED Final   Staphylococcus aureus (BCID) NOT DETECTED NOT DETECTED Final   Staphylococcus epidermidis NOT DETECTED NOT DETECTED Final   Staphylococcus lugdunensis NOT DETECTED NOT DETECTED Final   Streptococcus species NOT DETECTED NOT DETECTED Final   Streptococcus agalactiae NOT DETECTED NOT DETECTED Final   Streptococcus pneumoniae NOT DETECTED NOT DETECTED Final   Streptococcus pyogenes NOT DETECTED NOT DETECTED Final   A.calcoaceticus-baumannii NOT DETECTED NOT DETECTED Final   Bacteroides fragilis NOT DETECTED NOT DETECTED Final   Enterobacterales DETECTED (A) NOT DETECTED Final    Comment: Enterobacterales represent a large order of gram negative bacteria, not a single organism. CRITICAL RESULT CALLED TO, READ BACK BY AND VERIFIED WITH: PHARMD G ABBOTT 03/19/2024 @ 0418 BY AB    Enterobacter cloacae complex NOT DETECTED NOT DETECTED Final   Escherichia coli DETECTED (A) NOT DETECTED Final    Comment: CRITICAL RESULT CALLED TO, READ BACK BY AND VERIFIED WITH: PHARMD G ABBOTT 03/19/2024 @ 0418 BY AB    Klebsiella aerogenes NOT DETECTED NOT DETECTED Final   Klebsiella oxytoca NOT DETECTED NOT DETECTED Final   Klebsiella pneumoniae NOT DETECTED NOT DETECTED Final   Proteus species NOT DETECTED NOT DETECTED Final   Salmonella species NOT DETECTED NOT  DETECTED Final   Serratia marcescens NOT DETECTED NOT DETECTED Final   Haemophilus influenzae NOT DETECTED NOT DETECTED Final   Neisseria meningitidis NOT DETECTED NOT DETECTED Final   Pseudomonas aeruginosa NOT DETECTED NOT DETECTED Final   Stenotrophomonas maltophilia NOT DETECTED NOT DETECTED Final   Candida albicans NOT DETECTED NOT DETECTED Final   Candida auris NOT DETECTED NOT DETECTED Final   Candida  glabrata NOT DETECTED NOT DETECTED Final   Candida krusei NOT DETECTED NOT DETECTED Final   Candida parapsilosis NOT DETECTED NOT DETECTED Final   Candida tropicalis NOT DETECTED NOT DETECTED Final   Cryptococcus neoformans/gattii NOT DETECTED NOT DETECTED Final   CTX-M ESBL NOT DETECTED NOT DETECTED Final   Carbapenem resistance IMP NOT DETECTED NOT DETECTED Final   Carbapenem resistance KPC NOT DETECTED NOT DETECTED Final   Carbapenem resistance NDM NOT DETECTED NOT DETECTED Final   Carbapenem resist OXA 48 LIKE NOT DETECTED NOT DETECTED Final   Carbapenem resistance VIM NOT DETECTED NOT DETECTED Final    Comment: Performed at [facility], [address] [place], [place] [address]  MRSA Next Gen by PCR, Nasal     Status: None   Collection Time: 03/20/24  8:29 PM   Specimen: Nasal Mucosa; Nasal Swab  Result Value Ref Range Status   MRSA by PCR Next Gen NOT DETECTED NOT DETECTED Final    Comment: (NOTE) The GeneXpert MRSA Assay (FDA approved for NASAL specimens only), is one component of a comprehensive MRSA colonization surveillance program. It is not intended to diagnose MRSA infection nor to guide or monitor treatment for MRSA infections. Test performance is not FDA approved in patients less than 41 years old. Performed at [facility], [address], [place], [place] [address]          Radiology Studies: No results found.       Scheduled Meds:  acetaminophen   650 mg Oral Q6H   Chlorhexidine  Gluconate Cloth  6 each Topical Daily   diltiazem    300 mg Oral Daily   insulin  aspart  0-15 Units Subcutaneous TID WC   pantoprazole  (PROTONIX ) IV  40 mg Intravenous Q12H   tamsulosin   0.8 mg Oral QPM   Continuous Infusions:  chlorproMAZINE  (THORAZINE ) 12.5 mg in sodium chloride  0.9 % 25 mL IVPB Stopped (03/20/24 2038)   piperacillin -tazobactam (ZOSYN )  IV 3.375 g (03/22/24 0608)          Sophie Mao, MD [facility] 03/22/2024, 1:13 PM

## 2024-03-22 NOTE — Discharge Instructions (Signed)

## 2024-03-22 NOTE — Plan of Care (Signed)
  Problem: Education: Goal: Ability to describe self-care measures that may prevent or decrease complications (Diabetes Survival Skills Education) will improve Outcome: Adequate for Discharge Goal: Individualized Educational Video(s) Outcome: Adequate for Discharge   Problem: Coping: Goal: Ability to adjust to condition or change in health will improve 03/22/2024 1224 by Delores Kirsch, RN Outcome: Adequate for Discharge 03/22/2024 1223 by Delores Kirsch, RN Outcome: Progressing   Problem: Fluid Volume: Goal: Ability to maintain a balanced intake and output will improve 03/22/2024 1224 by Delores Kirsch, RN Outcome: Adequate for Discharge 03/22/2024 1223 by Delores Kirsch, RN Outcome: Progressing   Problem: Health Behavior/Discharge Planning: Goal: Ability to identify and utilize available resources and services will improve Outcome: Adequate for Discharge Goal: Ability to manage health-related needs will improve Outcome: Adequate for Discharge   Problem: Metabolic: Goal: Ability to maintain appropriate glucose levels will improve Outcome: Adequate for Discharge   Problem: Nutritional: Goal: Maintenance of adequate nutrition will improve Outcome: Adequate for Discharge Goal: Progress toward achieving an optimal weight will improve Outcome: Adequate for Discharge   Problem: Skin Integrity: Goal: Risk for impaired skin integrity will decrease Outcome: Adequate for Discharge   Problem: Tissue Perfusion: Goal: Adequacy of tissue perfusion will improve Outcome: Adequate for Discharge   Problem: Education: Goal: Knowledge of General Education information will improve Description: Including pain rating scale, medication(s)/side effects and non-pharmacologic comfort measures Outcome: Adequate for Discharge   Problem: Health Behavior/Discharge Planning: Goal: Ability to manage health-related needs will improve Outcome: Adequate for Discharge   Problem: Clinical  Measurements: Goal: Ability to maintain clinical measurements within normal limits will improve Outcome: Adequate for Discharge Goal: Will remain free from infection 03/22/2024 1224 by Delores Kirsch, RN Outcome: Adequate for Discharge 03/22/2024 1223 by Delores Kirsch, RN Outcome: Progressing Goal: Diagnostic test results will improve Outcome: Adequate for Discharge Goal: Respiratory complications will improve Outcome: Adequate for Discharge Goal: Cardiovascular complication will be avoided Outcome: Adequate for Discharge   Problem: Activity: Goal: Risk for activity intolerance will decrease Outcome: Adequate for Discharge   Problem: Nutrition: Goal: Adequate nutrition will be maintained Outcome: Adequate for Discharge   Problem: Coping: Goal: Level of anxiety will decrease Outcome: Adequate for Discharge   Problem: Elimination: Goal: Will not experience complications related to bowel motility Outcome: Adequate for Discharge Goal: Will not experience complications related to urinary retention Outcome: Adequate for Discharge   Problem: Pain Managment: Goal: General experience of comfort will improve and/or be controlled Outcome: Adequate for Discharge   Problem: Safety: Goal: Ability to remain free from injury will improve Outcome: Adequate for Discharge   Problem: Skin Integrity: Goal: Risk for impaired skin integrity will decrease Outcome: Adequate for Discharge

## 2024-03-23 DIAGNOSIS — K859 Acute pancreatitis without necrosis or infection, unspecified: Secondary | ICD-10-CM | POA: Diagnosis not present

## 2024-03-23 DIAGNOSIS — K861 Other chronic pancreatitis: Secondary | ICD-10-CM | POA: Diagnosis not present

## 2024-03-23 LAB — BASIC METABOLIC PANEL WITH GFR
Anion gap: 9 (ref 5–15)
BUN: 12 mg/dL (ref 8–23)
CO2: 20 mmol/L — ABNORMAL LOW (ref 22–32)
Calcium: 8.1 mg/dL — ABNORMAL LOW (ref 8.9–10.3)
Chloride: 105 mmol/L (ref 98–111)
Creatinine, Ser: 0.85 mg/dL (ref 0.61–1.24)
GFR, Estimated: >60 mL/min (ref >60)
Glucose, Bld: 132 mg/dL — ABNORMAL HIGH (ref 70–99)
Potassium: 3.7 mmol/L (ref 3.5–5.1)
Sodium: 134 mmol/L — ABNORMAL LOW (ref 135–145)

## 2024-03-23 LAB — HEPATIC FUNCTION PANEL
ALT: 42 U/L (ref 0–44)
AST: 24 U/L (ref 15–41)
Albumin: 2.4 g/dL — ABNORMAL LOW (ref 3.5–5.0)
Alkaline Phosphatase: 94 U/L (ref 38–126)
Bilirubin, Direct: 0.5 mg/dL — ABNORMAL HIGH (ref 0.0–0.2)
Indirect Bilirubin: 0.9 mg/dL (ref 0.3–0.9)
Total Bilirubin: 1.4 mg/dL — ABNORMAL HIGH (ref 0.0–1.2)
Total Protein: 5.7 g/dL — ABNORMAL LOW (ref 6.5–8.1)

## 2024-03-23 LAB — CULTURE, BLOOD (ROUTINE X 2): Culture: NO GROWTH

## 2024-03-23 LAB — CBC WITH DIFFERENTIAL/PLATELET
Abs Immature Granulocytes: 0.6 10*3/uL — ABNORMAL HIGH (ref 0.00–0.07)
Basophils Absolute: 0.1 10*3/uL (ref 0.0–0.1)
Basophils Relative: 1 %
Eosinophils Absolute: 0 10*3/uL (ref 0.0–0.5)
Eosinophils Relative: 0 %
HCT: 42 % (ref 39.0–52.0)
Hemoglobin: 14.5 g/dL (ref 13.0–17.0)
Immature Granulocytes: 5 %
Lymphocytes Relative: 10 %
Lymphs Abs: 1.3 10*3/uL (ref 0.7–4.0)
MCH: 32.2 pg (ref 26.0–34.0)
MCHC: 34.5 g/dL (ref 30.0–36.0)
MCV: 93.3 fL (ref 80.0–100.0)
Monocytes Absolute: 1.2 10*3/uL — ABNORMAL HIGH (ref 0.1–1.0)
Monocytes Relative: 9 %
Neutro Abs: 9.4 10*3/uL — ABNORMAL HIGH (ref 1.7–7.7)
Neutrophils Relative %: 75 %
Platelets: 258 10*3/uL (ref 150–400)
RBC: 4.5 MIL/uL (ref 4.22–5.81)
RDW: 12.9 % (ref 11.5–15.5)
WBC: 12.5 10*3/uL — ABNORMAL HIGH (ref 4.0–10.5)
nRBC: 0 % (ref 0.0–0.2)

## 2024-03-23 LAB — SURGICAL PATHOLOGY

## 2024-03-23 LAB — MAGNESIUM: Magnesium: 2 mg/dL (ref 1.7–2.4)

## 2024-03-23 LAB — GLUCOSE, CAPILLARY: Glucose-Capillary: 126 mg/dL — ABNORMAL HIGH (ref 70–99)

## 2024-03-23 MED ORDER — AMOXICILLIN-POT CLAVULANATE 875-125 MG PO TABS
1.0000 | ORAL_TABLET | Freq: Two times a day (BID) | ORAL | 0 refills | Status: AC
Start: 2024-03-23 — End: 2024-03-28

## 2024-03-23 MED ORDER — OXYCODONE HCL 5 MG PO TABS: 5.0000 mg | ORAL_TABLET | Freq: Four times a day (QID) | ORAL | 0 refills | Status: DC | PRN

## 2024-03-23 MED ORDER — ONDANSETRON HCL 4 MG PO TABS: 4.0000 mg | ORAL_TABLET | Freq: Three times a day (TID) | ORAL | 0 refills | Status: DC | PRN

## 2024-03-23 NOTE — Progress Notes (Signed)
 Subjective: Seen today with wife at bedside.  Patient has minimal pain around incisions with no complaint of nausea vomiting. Last BM was last night and he endorses having flatus. Patient is tolerating regular diet well. Patient is in no acute distress and is hemodynamically stable.  Objective: Vital signs in last 24 hours: Temp:  [97.4 F (36.3 C)-97.8 F (36.6 C)] 97.7 F (36.5 C) (07/01 0414) Pulse Rate:  [58-71] 71 (07/01 0414) Resp:  [17] 17 (07/01 0414) BP: (126-148)/(83-93) 131/93 (07/01 0414) SpO2:  [96 %-97 %] 96 % (07/01 0414) Last BM Date : 03/19/24  Intake/Output from previous day: 06/30 0701 - 07/01 0700 In: 645.6 [P.O.:460; IV Piggyback:185.6] Out: -  Intake/Output this shift: No intake/output data recorded.  Physical Exam: HEENT - sclerae clear, mucous membranes moist Abdomen - soft, obese; non-tender; incision sites clean dry and intact without signs of infection, minimal tenderness around incisions.  Lab Results:  Recent Labs    03/22/24 0643 03/23/24 0632  WBC 12.9* 12.5*  HGB 15.1 14.5  HCT 43.7 42.0  PLT 242 258   BMET Recent Labs    03/21/24 0504 03/22/24 0643  NA 135 131*  K 3.2* 3.9  CL 107 104  CO2 20* 17*  GLUCOSE 110* 175*  BUN 6* 8  CREATININE 0.72 0.74  CALCIUM  8.0* 8.2*   PT/INR No results for input(s): LABPROT, INR in the last 72 hours. Comprehensive Metabolic Panel:    Component Value Date/Time   NA 131 (L) 03/22/2024 0643   NA 135 03/21/2024 0504   NA 137 08/11/2019 0000   NA 137 06/22/2018 0000   K 3.9 03/22/2024 0643   K 3.2 (L) 03/21/2024 0504   CL 104 03/22/2024 0643   CL 107 03/21/2024 0504   CO2 17 (L) 03/22/2024 0643   CO2 20 (L) 03/21/2024 0504   BUN 8 03/22/2024 0643   BUN 6 (L) 03/21/2024 0504   BUN 25 01/10/2022 0941   BUN 13 08/11/2019 0000   BUN 13 08/11/2019 0000   CREATININE 0.74 03/22/2024 0643   CREATININE 0.72 03/21/2024 0504   CREATININE 0.97 12/11/2022 1404   CREATININE 1.01  05/27/2014 0945   GLUCOSE 175 (H) 03/22/2024 0643   GLUCOSE 110 (H) 03/21/2024 0504   CALCIUM  8.2 (L) 03/22/2024 0643   CALCIUM  8.0 (L) 03/21/2024 0504   AST 28 03/22/2024 0643   AST 20 03/21/2024 0504   ALT 54 (H) 03/22/2024 0643   ALT 61 (H) 03/21/2024 0504   ALKPHOS 101 03/22/2024 0643   ALKPHOS 92 03/21/2024 0504   BILITOT 2.0 (H) 03/22/2024 0643   BILITOT 2.4 (H) 03/21/2024 0504   PROT 6.0 (L) 03/22/2024 0643   PROT 5.5 (L) 03/21/2024 0504   ALBUMIN  2.6 (L) 03/22/2024 0643   ALBUMIN  2.3 (L) 03/21/2024 0504    Studies/Results: MR ABDOMEN MRCP W WO CONTAST Result Date: 03/22/2024 CLINICAL DATA:  Cholelithiasis, rule out common bile duct stone, positive MRCP earlier this admission, significant improvement in LFTs EXAM: MRI ABDOMEN WITHOUT AND WITH CONTRAST (INCLUDING MRCP) TECHNIQUE: Multiplanar multisequence MR imaging of the abdomen was performed both before and after the administration of intravenous contrast. Heavily T2-weighted images of the biliary and pancreatic ducts were obtained, and three-dimensional MRCP images were rendered by post processing. CONTRAST:  10mL GADAVIST  GADOBUTROL  1 MMOL/ML IV SOLN COMPARISON:  03/18/2024 FINDINGS: Lower chest: Trace bilateral pleural effusions and associated atelectasis or consolidation. Hepatobiliary: No solid liver abnormality is seen. Status post interval cholecystectomy with expected postoperative  fat stranding and fluid in the gallbladder fossa (series 8, image 24). No filling defect within the common hepatic or common bile ducts on today's examination (series 12, image 19). Common hepatic duct measures up to 0.6 cm in caliber in the common bile duct tapers smoothly to the ampulla without calculus or other obstruction. Pancreas: Near complete resolution of previously seen inflammatory findings about the pancreas. No pancreatic ductal dilatation. Mildly expansile, edematous, masslike appearance of the pancreatic uncinate, this region measuring  2.7 x 1.7 cm (series 8, image 30). Mild associated diffusion restriction and hypoenhancement (series 6, image 62). Appearance is generally similar to multiple other prior examinations. Spleen: Normal in size without significant abnormality. Adrenals/Urinary Tract: Adrenal glands are unremarkable. Kidneys are normal, without renal calculi, solid lesion, or hydronephrosis. Stomach/Bowel: Stomach is within normal limits. No evidence of bowel wall thickening, distention, or inflammatory changes. Pancolonic diverticulosis. Vascular/Lymphatic: No significant vascular findings are present. No enlarged abdominal lymph nodes. Other: No abdominal wall hernia or abnormality. Minimal perihepatic ascites. Musculoskeletal: No acute or significant osseous findings. IMPRESSION: 1. Status post interval cholecystectomy with expected postoperative fat stranding and fluid in the gallbladder fossa. 2. No filling defect within the common hepatic or common bile ducts on today's examination. Common hepatic duct measures up to 0.6 cm in caliber, and the common bile duct tapers smoothly to the ampulla without calculus or other obstruction. 3. Near complete resolution of previously seen inflammatory findings about the pancreas. No pancreatic ductal dilatation. 4. Mildly expansile, edematous, masslike appearance of the pancreatic uncinate, this region measuring 2.7 x 1.7 cm. Mild associated diffusion restriction and hypoenhancement. Appearance is generally similar to multiple other prior examinations and this favors residual pancreatitis and/or chronic sequelae thereof. Consider short interval follow-up to ensure stability or resolution and exclude underlying mass. 5. Trace bilateral pleural effusions and associated atelectasis or consolidation. 6. Pancolonic diverticulosis without evidence of acute diverticulitis. Electronically Signed   By: Marolyn JONETTA Jaksch M.D.   On: 03/22/2024 19:19   MR 3D Recon At Scanner Result Date: 03/22/2024 CLINICAL  DATA:  Cholelithiasis, rule out common bile duct stone, positive MRCP earlier this admission, significant improvement in LFTs EXAM: MRI ABDOMEN WITHOUT AND WITH CONTRAST (INCLUDING MRCP) TECHNIQUE: Multiplanar multisequence MR imaging of the abdomen was performed both before and after the administration of intravenous contrast. Heavily T2-weighted images of the biliary and pancreatic ducts were obtained, and three-dimensional MRCP images were rendered by post processing. CONTRAST:  10mL GADAVIST  GADOBUTROL  1 MMOL/ML IV SOLN COMPARISON:  03/18/2024 FINDINGS: Lower chest: Trace bilateral pleural effusions and associated atelectasis or consolidation. Hepatobiliary: No solid liver abnormality is seen. Status post interval cholecystectomy with expected postoperative fat stranding and fluid in the gallbladder fossa (series 8, image 24). No filling defect within the common hepatic or common bile ducts on today's examination (series 12, image 19). Common hepatic duct measures up to 0.6 cm in caliber in the common bile duct tapers smoothly to the ampulla without calculus or other obstruction. Pancreas: Near complete resolution of previously seen inflammatory findings about the pancreas. No pancreatic ductal dilatation. Mildly expansile, edematous, masslike appearance of the pancreatic uncinate, this region measuring 2.7 x 1.7 cm (series 8, image 30). Mild associated diffusion restriction and hypoenhancement (series 6, image 62). Appearance is generally similar to multiple other prior examinations. Spleen: Normal in size without significant abnormality. Adrenals/Urinary Tract: Adrenal glands are unremarkable. Kidneys are normal, without renal calculi, solid lesion, or hydronephrosis. Stomach/Bowel: Stomach is within normal limits. No evidence of bowel wall  thickening, distention, or inflammatory changes. Pancolonic diverticulosis. Vascular/Lymphatic: No significant vascular findings are present. No enlarged abdominal lymph  nodes. Other: No abdominal wall hernia or abnormality. Minimal perihepatic ascites. Musculoskeletal: No acute or significant osseous findings. IMPRESSION: 1. Status post interval cholecystectomy with expected postoperative fat stranding and fluid in the gallbladder fossa. 2. No filling defect within the common hepatic or common bile ducts on today's examination. Common hepatic duct measures up to 0.6 cm in caliber, and the common bile duct tapers smoothly to the ampulla without calculus or other obstruction. 3. Near complete resolution of previously seen inflammatory findings about the pancreas. No pancreatic ductal dilatation. 4. Mildly expansile, edematous, masslike appearance of the pancreatic uncinate, this region measuring 2.7 x 1.7 cm. Mild associated diffusion restriction and hypoenhancement. Appearance is generally similar to multiple other prior examinations and this favors residual pancreatitis and/or chronic sequelae thereof. Consider short interval follow-up to ensure stability or resolution and exclude underlying mass. 5. Trace bilateral pleural effusions and associated atelectasis or consolidation. 6. Pancolonic diverticulosis without evidence of acute diverticulitis. Electronically Signed   By: Marolyn JONETTA Jaksch M.D.   On: 03/22/2024 19:19     Assessment & Plan: POD 1-  LAPAROSCOPIC CHOLECYSTECTOMY, 6/29, Dr. Stechschulte  - CT A/P 6/25 w/ acute pancreatitis, gallstones or polyps with pericholecystitic fat stranding - RUQ US  6/26 w/ Cholelithiasis without evidence of acute cholecystitis. CBD 5.3 mm. Portal vein patent.  - MRCP 6/26 w/ acute interstitial pancreatitis without pancreatic necrosis or peripancreatic/intrapancreatic collections. There was evidence of choledocholithiasis in the midportion of common bile duct. Also noted, small volume cholelithiasis without acute cholecystitis changes.   MRCP (6/30) shows No filling defect within the common hepatic or common bile ducts on today's  examination. Common hepatic duct measures up to 0.6 cm in caliber, and the common bile duct tapers smoothly to the ampulla without calculus or other obstruction.  - WBC 12.5K today, slightly decreased vs yesterday.   - Tbili 1.4, improved since yesterday, LFTs continues to trend down. - Cardiology evaluation - low risk for OR, holding Eliquis  since Wed 6/25 - symptomatically improved -Multimodal pain management.    FEN - Regular  VTE - SCDs, Medicine may restart home meds as appropriate.  ID - On Zosyn   Dispo- Patient is clear for DC from a surgical standpoint. Surgical follow up has been made and surgical discharge instructions attached to chart.     Eulah Hammonds, PA-C 03/23/2024  Patient ID: Sean Golden, male   DOB: July 29, 1946, 78 y.o.   MRN: [id number]

## 2024-03-23 NOTE — Discharge Summary (Signed)
 Physician Discharge Summary  Sean Golden FMW:[id number] DOB: May 12, 1946 DOA: 03/17/2024  PCP: [name], MD  Admit date: 03/17/2024 Discharge date: 03/23/2024  Admitted From: Home Disposition: Home  Recommendations for Outpatient Follow-up:  Follow up with PCP in 1 week with repeat CBC/CMP Outpatient general surgery.  Wound care and pain management as per general surgery recommendations. Follow up in ED if symptoms worsen or new appear   Home Health: No Equipment/Devices: None  Discharge Condition: Stable CODE STATUS: Full Diet recommendation: Heart healthy  Brief/Interim Summary: 78 y.o. male with medical history significant of chronic pancreatitis, persistent atrial fibrillation, essential hypertension, GERD, sleep apnea, obesity presented with abdominal pain and nausea.  On presentation, WBC was 17.7, bicarb of 17, AST of 247, ALT of 126, total bili of 2.5, lipase of 2089.  CT of abdomen and pelvis showed acute pancreatitis with gallstones or polyps with pericholecystic fat stranding.  Right upper upper quadrant ultrasound showed cholelithiasis without acute cholecystitis and hepatic steatosis.  MRI/MRCP of abdomen showed acute interstitial pancreatitis without necrosis with probable choledocholithiasis in the midportion of common bile duct.  He was started on IV fluids and antibiotics and transferred to [facility] as per GI recommendations.  General surgery and cardiology were also consulted.  Subsequently, he underwent lap chole on 03/21/2024: Intraoperative cholangiogram could not be performed because of anatomy.  Patient underwent repeat MRCP on 03/22/2024 as per GI which did not show any filling defect within the common hepatic or common bile duct.  Subsequently, GI and general surgery have cleared the patient for discharge.  Patient is tolerating diet.  He will be discharged home today with outpatient follow-up with PCP and general surgery.  Discharge Diagnoses:   Acute  on chronic gallstone pancreatitis Cholelithiasis and choledocholithiasis with elevated LFTs E. coli bacteremia Sepsis: Present on admission as evidenced by leukocytosis, tachycardia, E. coli bacteremia -Imaging as above.  Currently on broad-spectrum antibiotics.  Blood cultures grew E. coli - GI and general surgery following.  Underwent lap chole on 03/21/2024 by general surgery. Intraoperative cholangiogram could not be performed because of anatomy.  Patient underwent repeat MRCP on 03/22/2024 as per GI which did not show any filling defect within the common hepatic or common bile duct.  Subsequently, GI and general surgery have cleared the patient for discharge.  Patient is tolerating diet.  He will be discharged home today on oral Augmentin with outpatient follow-up with PCP and general surgery. -LFTs improving significantly.  WBCs remain as well.     Leukocytosis  - Improving.  Outpatient follow-up   Hyponatremia - Mild.  Improving.  Outpatient follow-up.  Hypokalemia - Improved   Acute metabolic acidosis - Improving.  Encourage oral intake.  Outpatient follow-up.   Prediabetes with hyperglycemia - A1c 5.9.  Outpatient follow-up  Persistent A-fib - Rate controlled.  Continue Cardizem .  Resume Eliquis  on discharge.   GERD - Continue PPI   Obesity class I - Outpatient follow-up  Discharge Instructions  Discharge Instructions     Diet - low sodium heart healthy   Complete by: As directed    Discharge wound care:   Complete by: As directed    As per gen surgery   Increase activity slowly   Complete by: As directed       Allergies as of 03/23/2024   No Known Allergies      Medication List     TAKE these medications    acetaminophen  325 MG tablet Commonly known as: TYLENOL  Take  2 tablets (650 mg total) by mouth every 6 (six) hours as needed for mild pain, fever or headache (or Fever >/= 101). What changed: how much to take   amoxicillin-clavulanate 875-125 MG  tablet Commonly known as: AUGMENTIN Take 1 tablet by mouth 2 (two) times daily for 5 days.   apixaban  5 MG Tabs tablet Commonly known as: ELIQUIS  Take 1 tablet (5 mg total) by mouth 2 (two) times daily.   diltiazem  300 MG 24 hr capsule Commonly known as: Cardizem  CD Take 1 capsule (300 mg total) by mouth daily.   ondansetron  4 MG tablet Commonly known as: Zofran  Take 1 tablet (4 mg total) by mouth every 8 (eight) hours as needed for nausea or vomiting.   oxyCODONE  5 MG immediate release tablet Commonly known as: Oxy IR/ROXICODONE  Take 1 tablet (5 mg total) by mouth every 6 (six) hours as needed.   pantoprazole  40 MG tablet Commonly known as: PROTONIX  Take 1 tablet (40 mg total) by mouth 2 (two) times daily.   spironolactone  25 MG tablet Commonly known as: ALDACTONE  Take 1 tablet (25 mg total) by mouth daily.   tamsulosin  0.4 MG Caps capsule Commonly known as: FLOMAX  Take 2 capsules (0.8 mg total) by mouth every evening.               Discharge Care Instructions  (From admission, onward)           Start     Ordered   03/22/24 0000  Discharge wound care:       Comments: As per gen surgery   03/22/24 1203              Follow-up Information     Maczis, Tonja Barban, PA-C Follow up on 04/15/2024.   Specialty: General Surgery Why: 3:30pm, Arrive 30 minutes prior to your appointment time, Please bring your insurance card and photo ID Contact information: [address] [place] [address] [facility] [place] [facility] [place] [address] [phone]         [name], MD. Schedule an appointment as soon as possible for a visit in 1 week(s).   Specialty: Internal Medicine Contact information: [address] [place] [address] [phone]                No Known Allergies  Consultations: GI/general surgery/cardiology   Procedures/Studies: MR ABDOMEN MRCP W WO CONTAST Result Date: 03/22/2024 CLINICAL DATA:  Cholelithiasis, rule  out common bile duct stone, positive MRCP earlier this admission, significant improvement in LFTs EXAM: MRI ABDOMEN WITHOUT AND WITH CONTRAST (INCLUDING MRCP) TECHNIQUE: Multiplanar multisequence MR imaging of the abdomen was performed both before and after the administration of intravenous contrast. Heavily T2-weighted images of the biliary and pancreatic ducts were obtained, and three-dimensional MRCP images were rendered by post processing. CONTRAST:  10mL GADAVIST  GADOBUTROL  1 MMOL/ML IV SOLN COMPARISON:  03/18/2024 FINDINGS: Lower chest: Trace bilateral pleural effusions and associated atelectasis or consolidation. Hepatobiliary: No solid liver abnormality is seen. Status post interval cholecystectomy with expected postoperative fat stranding and fluid in the gallbladder fossa (series 8, image 24). No filling defect within the common hepatic or common bile ducts on today's examination (series 12, image 19). Common hepatic duct measures up to 0.6 cm in caliber in the common bile duct tapers smoothly to the ampulla without calculus or other obstruction. Pancreas: Near complete resolution of previously seen inflammatory findings about the pancreas. No pancreatic ductal dilatation. Mildly expansile, edematous, masslike appearance of the pancreatic uncinate,  this region measuring 2.7 x 1.7 cm (series 8, image 30). Mild associated diffusion restriction and hypoenhancement (series 6, image 62). Appearance is generally similar to multiple other prior examinations. Spleen: Normal in size without significant abnormality. Adrenals/Urinary Tract: Adrenal glands are unremarkable. Kidneys are normal, without renal calculi, solid lesion, or hydronephrosis. Stomach/Bowel: Stomach is within normal limits. No evidence of bowel wall thickening, distention, or inflammatory changes. Pancolonic diverticulosis. Vascular/Lymphatic: No significant vascular findings are present. No enlarged abdominal lymph nodes. Other: No abdominal  wall hernia or abnormality. Minimal perihepatic ascites. Musculoskeletal: No acute or significant osseous findings. IMPRESSION: 1. Status post interval cholecystectomy with expected postoperative fat stranding and fluid in the gallbladder fossa. 2. No filling defect within the common hepatic or common bile ducts on today's examination. Common hepatic duct measures up to 0.6 cm in caliber, and the common bile duct tapers smoothly to the ampulla without calculus or other obstruction. 3. Near complete resolution of previously seen inflammatory findings about the pancreas. No pancreatic ductal dilatation. 4. Mildly expansile, edematous, masslike appearance of the pancreatic uncinate, this region measuring 2.7 x 1.7 cm. Mild associated diffusion restriction and hypoenhancement. Appearance is generally similar to multiple other prior examinations and this favors residual pancreatitis and/or chronic sequelae thereof. Consider short interval follow-up to ensure stability or resolution and exclude underlying mass. 5. Trace bilateral pleural effusions and associated atelectasis or consolidation. 6. Pancolonic diverticulosis without evidence of acute diverticulitis. Electronically Signed   By: Marolyn JONETTA Jaksch M.D.   On: 03/22/2024 19:19   MR 3D Recon At Scanner Result Date: 03/22/2024 CLINICAL DATA:  Cholelithiasis, rule out common bile duct stone, positive MRCP earlier this admission, significant improvement in LFTs EXAM: MRI ABDOMEN WITHOUT AND WITH CONTRAST (INCLUDING MRCP) TECHNIQUE: Multiplanar multisequence MR imaging of the abdomen was performed both before and after the administration of intravenous contrast. Heavily T2-weighted images of the biliary and pancreatic ducts were obtained, and three-dimensional MRCP images were rendered by post processing. CONTRAST:  10mL GADAVIST  GADOBUTROL  1 MMOL/ML IV SOLN COMPARISON:  03/18/2024 FINDINGS: Lower chest: Trace bilateral pleural effusions and associated atelectasis or  consolidation. Hepatobiliary: No solid liver abnormality is seen. Status post interval cholecystectomy with expected postoperative fat stranding and fluid in the gallbladder fossa (series 8, image 24). No filling defect within the common hepatic or common bile ducts on today's examination (series 12, image 19). Common hepatic duct measures up to 0.6 cm in caliber in the common bile duct tapers smoothly to the ampulla without calculus or other obstruction. Pancreas: Near complete resolution of previously seen inflammatory findings about the pancreas. No pancreatic ductal dilatation. Mildly expansile, edematous, masslike appearance of the pancreatic uncinate, this region measuring 2.7 x 1.7 cm (series 8, image 30). Mild associated diffusion restriction and hypoenhancement (series 6, image 62). Appearance is generally similar to multiple other prior examinations. Spleen: Normal in size without significant abnormality. Adrenals/Urinary Tract: Adrenal glands are unremarkable. Kidneys are normal, without renal calculi, solid lesion, or hydronephrosis. Stomach/Bowel: Stomach is within normal limits. No evidence of bowel wall thickening, distention, or inflammatory changes. Pancolonic diverticulosis. Vascular/Lymphatic: No significant vascular findings are present. No enlarged abdominal lymph nodes. Other: No abdominal wall hernia or abnormality. Minimal perihepatic ascites. Musculoskeletal: No acute or significant osseous findings. IMPRESSION: 1. Status post interval cholecystectomy with expected postoperative fat stranding and fluid in the gallbladder fossa. 2. No filling defect within the common hepatic or common bile ducts on today's examination. Common hepatic duct measures up to 0.6 cm in caliber, and the  common bile duct tapers smoothly to the ampulla without calculus or other obstruction. 3. Near complete resolution of previously seen inflammatory findings about the pancreas. No pancreatic ductal dilatation. 4.  Mildly expansile, edematous, masslike appearance of the pancreatic uncinate, this region measuring 2.7 x 1.7 cm. Mild associated diffusion restriction and hypoenhancement. Appearance is generally similar to multiple other prior examinations and this favors residual pancreatitis and/or chronic sequelae thereof. Consider short interval follow-up to ensure stability or resolution and exclude underlying mass. 5. Trace bilateral pleural effusions and associated atelectasis or consolidation. 6. Pancolonic diverticulosis without evidence of acute diverticulitis. Electronically Signed   By: Marolyn JONETTA Jaksch M.D.   On: 03/22/2024 19:19   US  Abdomen Limited RUQ (LIVER/GB) Result Date: 03/18/2024 CLINICAL DATA:  Right upper quadrant pain. EXAM: ULTRASOUND ABDOMEN LIMITED RIGHT UPPER QUADRANT COMPARISON:  None Available. FINDINGS: Gallbladder: A 6.7 mm gallstone is seen near the neck of the gallbladder. There is no evidence of gallbladder wall thickening (2.2 mm). No sonographic Murphy sign noted by sonographer. Common bile duct: Diameter: 5.3 mm Liver: No focal lesion identified. Diffusely increased echogenicity of the liver parenchyma is noted. Portal vein is patent on color Doppler imaging with normal direction of blood flow towards the liver. Other: None. IMPRESSION: 1. Cholelithiasis without evidence of acute cholecystitis. 2. Hepatic steatosis. Electronically Signed   By: Suzen Dials M.D.   On: 03/18/2024 09:27   MR ABDOMEN MRCP W WO CONTAST Result Date: 03/18/2024 CLINICAL DATA:  Cholelithiasis.  Pancreatitis, acute, severe. EXAM: MRI ABDOMEN WITHOUT AND WITH CONTRAST (INCLUDING MRCP) TECHNIQUE: Multiplanar multisequence MR imaging of the abdomen was performed both before and after the administration of intravenous contrast. Heavily T2-weighted images of the biliary and pancreatic ducts were obtained, and three-dimensional MRCP images were rendered by post processing. CONTRAST:  10mL GADAVIST  GADOBUTROL  1  MMOL/ML IV SOLN COMPARISON:  CT scan abdomen and pelvis from 03/17/2024. FINDINGS: Lower chest: Unremarkable MR appearance to the lung bases. No pleural effusion. No pericardial effusion. Normal heart size. Hepatobiliary: The liver is normal in size. Noncirrhotic configuration. There is mild diffuse hepatic steatosis. Is there is a 5 mm simple cyst in the left hepatic lobe, segment 2. No suspicious liver lesion. The portal vein and hepatic veins are patent. No intrahepatic or extrahepatic bile duct dilatation. However, there is a single approximately 4 x 7 mm filling defect noted in the midportion of common bile duct (series 11, image 12), which may represent a choledocholithiasis. However, this filling defect is not seen on any other sequences including the MRCP sequences. Gallbladder is physiologically distended. Small volume dependent gallstones noted. No abnormal gallbladder wall thickening or pericholecystic fat stranding. There is trace amount of fluid in the interface between the gallbladder and liver, likely reactive ascites from pancreatitis. Pancreas: There is slightly heterogeneous and bulky pancreatic body/tail with peripancreatic fat stranding, compatible with acute interstitial pancreatitis. No pancreatic necrosis or peripancreatic/intrapancreatic collections. Main pancreatic duct is not dilated. No suspicious pancreatic lesion. Spleen:  Within normal limits in size and appearance. No focal mass. Adrenals/Urinary Tract: Unremarkable adrenal glands. No hydroureteronephrosis. No suspicious renal mass. There multiple scattered cysts throughout bilateral kidneys with largest in the left kidney lower pole measuring up to 1.3 x 1.3 cm. Stomach/Bowel: Visualized portions within the abdomen are unremarkable. No disproportionate dilation of bowel loops. There multiple diverticula throughout the colon without imaging signs of diverticulitis. Vascular/Lymphatic: No pathologically enlarged lymph nodes identified.  No abdominal aortic aneurysm demonstrated. There is trace amount of free fluid in the  central upper abdomen as well as mild amount of fat stranding along the mesenteric vessels, favored reactive in the settings of pancreatitis. No walled-off abscess or loculated collection. Other:  There is small fat containing periumbilical hernia. Musculoskeletal: A hemangioma is noted in the right aspect of the L2 vertebral body. IMPRESSION: 1. Acute interstitial pancreatitis. No pancreatic necrosis or peripancreatic/intrapancreatic collections. No main pancreatic duct dilation. No intra or extrahepatic bile duct dilation. There is a probable single 4 x 7 mm choledocholithiasis in the midportion of common bile duct. Small volume cholelithiasis without acute cholecystitis. 2. Multiple other nonacute observations, as described above. Electronically Signed   By: Ree Molt M.D.   On: 03/18/2024 08:54   MR 3D Recon At Scanner Result Date: 03/18/2024 CLINICAL DATA:  Cholelithiasis.  Pancreatitis, acute, severe. EXAM: MRI ABDOMEN WITHOUT AND WITH CONTRAST (INCLUDING MRCP) TECHNIQUE: Multiplanar multisequence MR imaging of the abdomen was performed both before and after the administration of intravenous contrast. Heavily T2-weighted images of the biliary and pancreatic ducts were obtained, and three-dimensional MRCP images were rendered by post processing. CONTRAST:  10mL GADAVIST  GADOBUTROL  1 MMOL/ML IV SOLN COMPARISON:  CT scan abdomen and pelvis from 03/17/2024. FINDINGS: Lower chest: Unremarkable MR appearance to the lung bases. No pleural effusion. No pericardial effusion. Normal heart size. Hepatobiliary: The liver is normal in size. Noncirrhotic configuration. There is mild diffuse hepatic steatosis. Is there is a 5 mm simple cyst in the left hepatic lobe, segment 2. No suspicious liver lesion. The portal vein and hepatic veins are patent. No intrahepatic or extrahepatic bile duct dilatation. However, there is a single  approximately 4 x 7 mm filling defect noted in the midportion of common bile duct (series 11, image 12), which may represent a choledocholithiasis. However, this filling defect is not seen on any other sequences including the MRCP sequences. Gallbladder is physiologically distended. Small volume dependent gallstones noted. No abnormal gallbladder wall thickening or pericholecystic fat stranding. There is trace amount of fluid in the interface between the gallbladder and liver, likely reactive ascites from pancreatitis. Pancreas: There is slightly heterogeneous and bulky pancreatic body/tail with peripancreatic fat stranding, compatible with acute interstitial pancreatitis. No pancreatic necrosis or peripancreatic/intrapancreatic collections. Main pancreatic duct is not dilated. No suspicious pancreatic lesion. Spleen:  Within normal limits in size and appearance. No focal mass. Adrenals/Urinary Tract: Unremarkable adrenal glands. No hydroureteronephrosis. No suspicious renal mass. There multiple scattered cysts throughout bilateral kidneys with largest in the left kidney lower pole measuring up to 1.3 x 1.3 cm. Stomach/Bowel: Visualized portions within the abdomen are unremarkable. No disproportionate dilation of bowel loops. There multiple diverticula throughout the colon without imaging signs of diverticulitis. Vascular/Lymphatic: No pathologically enlarged lymph nodes identified. No abdominal aortic aneurysm demonstrated. There is trace amount of free fluid in the central upper abdomen as well as mild amount of fat stranding along the mesenteric vessels, favored reactive in the settings of pancreatitis. No walled-off abscess or loculated collection. Other:  There is small fat containing periumbilical hernia. Musculoskeletal: A hemangioma is noted in the right aspect of the L2 vertebral body. IMPRESSION: 1. Acute interstitial pancreatitis. No pancreatic necrosis or peripancreatic/intrapancreatic collections. No  main pancreatic duct dilation. No intra or extrahepatic bile duct dilation. There is a probable single 4 x 7 mm choledocholithiasis in the midportion of common bile duct. Small volume cholelithiasis without acute cholecystitis. 2. Multiple other nonacute observations, as described above. Electronically Signed   By: Ree Molt M.D.   On: 03/18/2024 08:54  CT ABDOMEN PELVIS W CONTRAST Result Date: 03/17/2024 CLINICAL DATA:  Abdominal pain. EXAM: CT ABDOMEN AND PELVIS WITH CONTRAST TECHNIQUE: Multidetector CT imaging of the abdomen and pelvis was performed using the standard protocol following bolus administration of intravenous contrast. RADIATION DOSE REDUCTION: This exam was performed according to the departmental dose-optimization program which includes automated exposure control, adjustment of the mA and/or kV according to patient size and/or use of iterative reconstruction technique. CONTRAST:  OMNIPAQUE  IOHEXOL  300 MG/ML  SOLN COMPARISON:  12/23/2022. FINDINGS: Lower chest: Dependent bibasilar subsegmental atelectasis or scarring. No pleural or pericardial effusions Hepatobiliary: No hepatic parenchymal abnormalities. Mild central biliary ductal prominence. Gallbladder contains other stones or polyps. Pericholecystic fat stranding. Pancreas: Peripancreatic fat stranding consistent with acute pancreatitis. No discrete collections. No pancreatic parenchymal abnormalities. Spleen: Normal in size without focal abnormality. Adrenals/Urinary Tract: No adrenal lesions. Left kidney left subcentimeter cyst. No hydronephrosis or nephrolithiasis. Unremarkable urinary bladder. Stomach/Bowel: Stomach is within normal limits. Appendix appears normal. No evidence of bowel wall thickening, distention, or inflammatory changes. Diffuse colonic diverticulosis. Vascular/Lymphatic: Aortic atherosclerosis. No enlarged abdominal or pelvic lymph nodes. Reproductive: Prostate is unremarkable. Other: Small periumbilical  abdominal wall defect containing omental fat without evidence of incarceration or herniated bowel. No abdominopelvic ascites. Musculoskeletal: Lumbosacral degenerative changes. No acute osseous abnormality. IMPRESSION: 1. Acute pancreatitis. 2. Gallbladder stones or polyps. Pericholecystic fat stranding. 3. Diverticulosis. 4. Small periumbilical abdominal wall defect containing omental fat. 5. Aortic atherosclerosis (ICD10-I70.0). Electronically Signed   By: Fonda Field M.D.   On: 03/17/2024 21:40      Subjective: Patient seen and examined at bedside.  Feels better.  Feels okay to vomited.  No fever, vomiting, worsening abdominal pain reported.  Discharge Exam: Vitals:   03/23/24 0414 03/23/24 0812  BP: (!) 131/93 (!) 137/99  Pulse: 71 69  Resp: 17 17  Temp: 97.7 F (36.5 C) (!) 97.5 F (36.4 C)  SpO2: 96% 97%    General: Pt is alert, awake, not in acute distress.  On room air. Cardiovascular: rate controlled, S1/S2 + Respiratory: bilateral decreased breath sounds at bases Abdominal: Soft, obese, mildly tender and distended, bowel sounds + Extremities: Trace lower extremity edema; no cyanosis    The results of significant diagnostics from this hospitalization (including imaging, microbiology, ancillary and laboratory) are listed below for reference.     Microbiology: Recent Results (from the past 240 hours)  Culture, blood (Routine X 2) w Reflex to ID Panel     Status: None   Collection Time: 03/18/24  8:57 AM   Specimen: BLOOD  Result Value Ref Range Status   Specimen Description BLOOD BLOOD LEFT ARM  Final   Special Requests   Final    BOTTLES DRAWN AEROBIC AND ANAEROBIC Blood Culture results may not be optimal due to an inadequate volume of blood received in culture bottles   Culture   Final    NO GROWTH 5 DAYS Performed at [facility], [address] [place], [place] [address]    Report Status 03/23/2024 FINAL  Final  Culture, blood (Routine X 2) w Reflex to ID  Panel     Status: Abnormal   Collection Time: 03/18/24  9:12 AM   Specimen: BLOOD  Result Value Ref Range Status   Specimen Description   Final    BLOOD BLOOD LEFT HAND Performed at [facility], [address], [place], [place] [address]    Special Requests   Final    BOTTLES DRAWN AEROBIC AND ANAEROBIC Blood Culture  results may not be optimal due to an inadequate volume of blood received in culture bottles Performed at [facility], [address], [place], [place] [address]    Culture  Setup Time   Final    GRAM NEGATIVE RODS IN BOTH AEROBIC AND ANAEROBIC BOTTLES Gram Stain Report Called to,Read Back By and Verified With: LEVORN LEEDS, RN [facility], [phone], AT 2238 03/18/24 BY A. SNYDER CRITICAL RESULT CALLED TO, READ BACK BY AND VERIFIED WITH: PHARMD G ABBOTT 03/19/2024 @ 0418 BY AB Performed at [facility], [address] [place], [place] [address]    Culture ESCHERICHIA COLI (A)  Final   Report Status 03/23/2024 FINAL  Final   Organism ID, Bacteria ESCHERICHIA COLI  Final   Organism ID, Bacteria ESCHERICHIA COLI  Final      Susceptibility   Escherichia coli - MIC*    AMPICILLIN  <=2 SENSITIVE Sensitive     CEFEPIME <=0.12 SENSITIVE Sensitive     CEFTAZIDIME <=1 SENSITIVE Sensitive     CEFTRIAXONE <=0.25 SENSITIVE Sensitive     CIPROFLOXACIN <=0.25 SENSITIVE Sensitive     GENTAMICIN 2 SENSITIVE Sensitive     IMIPENEM <=0.25 SENSITIVE Sensitive     TRIMETH/SULFA <=20 SENSITIVE Sensitive     AMPICILLIN /SULBACTAM <=2 SENSITIVE Sensitive     PIP/TAZO <=4 SENSITIVE Sensitive ug/mL   Escherichia coli - KIRBY BAUER*    CEFAZOLIN  SENSITIVE Sensitive     * ESCHERICHIA COLI    ESCHERICHIA COLI  Blood Culture ID Panel (Reflexed)     Status: Abnormal   Collection Time: 03/18/24  9:12 AM  Result Value Ref Range Status   Enterococcus faecalis NOT DETECTED NOT DETECTED Final   Enterococcus Faecium NOT DETECTED NOT DETECTED Final   Listeria monocytogenes NOT DETECTED NOT  DETECTED Final   Staphylococcus species NOT DETECTED NOT DETECTED Final   Staphylococcus aureus (BCID) NOT DETECTED NOT DETECTED Final   Staphylococcus epidermidis NOT DETECTED NOT DETECTED Final   Staphylococcus lugdunensis NOT DETECTED NOT DETECTED Final   Streptococcus species NOT DETECTED NOT DETECTED Final   Streptococcus agalactiae NOT DETECTED NOT DETECTED Final   Streptococcus pneumoniae NOT DETECTED NOT DETECTED Final   Streptococcus pyogenes NOT DETECTED NOT DETECTED Final   A.calcoaceticus-baumannii NOT DETECTED NOT DETECTED Final   Bacteroides fragilis NOT DETECTED NOT DETECTED Final   Enterobacterales DETECTED (A) NOT DETECTED Final    Comment: Enterobacterales represent a large order of gram negative bacteria, not a single organism. CRITICAL RESULT CALLED TO, READ BACK BY AND VERIFIED WITH: PHARMD G ABBOTT 03/19/2024 @ 0418 BY AB    Enterobacter cloacae complex NOT DETECTED NOT DETECTED Final   Escherichia coli DETECTED (A) NOT DETECTED Final    Comment: CRITICAL RESULT CALLED TO, READ BACK BY AND VERIFIED WITH: PHARMD G ABBOTT 03/19/2024 @ 0418 BY AB    Klebsiella aerogenes NOT DETECTED NOT DETECTED Final   Klebsiella oxytoca NOT DETECTED NOT DETECTED Final   Klebsiella pneumoniae NOT DETECTED NOT DETECTED Final   Proteus species NOT DETECTED NOT DETECTED Final   Salmonella species NOT DETECTED NOT DETECTED Final   Serratia marcescens NOT DETECTED NOT DETECTED Final   Haemophilus influenzae NOT DETECTED NOT DETECTED Final   Neisseria meningitidis NOT DETECTED NOT DETECTED Final   Pseudomonas aeruginosa NOT DETECTED NOT DETECTED Final   Stenotrophomonas maltophilia NOT DETECTED NOT DETECTED Final   Candida albicans NOT DETECTED NOT DETECTED Final   Candida auris NOT DETECTED NOT DETECTED Final   Candida glabrata NOT DETECTED NOT DETECTED  Final   Candida krusei NOT DETECTED NOT DETECTED Final   Candida parapsilosis NOT DETECTED NOT DETECTED Final   Candida tropicalis  NOT DETECTED NOT DETECTED Final   Cryptococcus neoformans/gattii NOT DETECTED NOT DETECTED Final   CTX-M ESBL NOT DETECTED NOT DETECTED Final   Carbapenem resistance IMP NOT DETECTED NOT DETECTED Final   Carbapenem resistance KPC NOT DETECTED NOT DETECTED Final   Carbapenem resistance NDM NOT DETECTED NOT DETECTED Final   Carbapenem resist OXA 48 LIKE NOT DETECTED NOT DETECTED Final   Carbapenem resistance VIM NOT DETECTED NOT DETECTED Final    Comment: Performed at [facility], [address], [place], [place] [address]  MRSA Next Gen by PCR, Nasal     Status: None   Collection Time: 03/20/24  8:29 PM   Specimen: Nasal Mucosa; Nasal Swab  Result Value Ref Range Status   MRSA by PCR Next Gen NOT DETECTED NOT DETECTED Final    Comment: (NOTE) The GeneXpert MRSA Assay (FDA approved for NASAL specimens only), is one component of a comprehensive MRSA colonization surveillance program. It is not intended to diagnose MRSA infection nor to guide or monitor treatment for MRSA infections. Test performance is not FDA approved in patients less than 27 years old. Performed at [facility], [address], [place], [place] [address]      Labs: BNP (last 3 results) No results for input(s): BNP in the last 8760 hours. Basic Metabolic Panel: Recent Labs  Lab 03/19/24 2006 03/20/24 0321 03/21/24 0504 03/22/24 0643 03/23/24 0632  NA 135 135 135 131* 134*  K 3.7 3.6 3.2* 3.9 3.7  CL 107 107 107 104 105  CO2 19* 18* 20* 17* 20*  GLUCOSE 121* 107* 110* 175* 132*  BUN 10 9 6* 8 12  CREATININE 0.98 0.89 0.72 0.74 0.85  CALCIUM  8.4* 8.3* 8.0* 8.2* 8.1*  MG  --  1.9 1.8 2.0 2.0   Liver Function Tests: Recent Labs  Lab 03/19/24 2006 03/20/24 0321 03/21/24 0504 03/22/24 0643 03/23/24 0632  AST 46* 35 20 28 24   ALT 117* 101* 61* 54* 42  ALKPHOS 87 92 92 101 94  BILITOT 3.9* 3.7* 2.4* 2.0* 1.4*  PROT 5.5* 5.5* 5.5* 6.0* 5.7*  ALBUMIN  2.7* 2.6* 2.3* 2.6* 2.4*   Recent  Labs  Lab 03/17/24 1704 03/19/24 1100 03/19/24 2006  LIPASE 2,089* 95* 65*   No results for input(s): AMMONIA in the last 168 hours. CBC: Recent Labs  Lab 03/19/24 1100 03/19/24 2006 03/20/24 0321 03/21/24 0504 03/22/24 0643 03/23/24 0632  WBC 16.4* 14.6* 13.7* 12.0* 12.9* 12.5*  NEUTROABS 14.3*  --  11.7* 9.6* 10.8* 9.4*  HGB 14.3 14.2 14.3 14.4 15.1 14.5  HCT 42.8 41.5 42.2 42.7 43.7 42.0  MCV 95.7 95.4 95.3 94.3 93.0 93.3  PLT 223 199 210 217 242 258   Cardiac Enzymes: No results for input(s): CKTOTAL, CKMB, CKMBINDEX, TROPONINI in the last 168 hours. BNP: Invalid input(s): POCBNP CBG: Recent Labs  Lab 03/22/24 0739 03/22/24 1243 03/22/24 1718 03/22/24 2103 03/23/24 0809  GLUCAP 170* 138* 148* 159* 126*   D-Dimer No results for input(s): DDIMER in the last 72 hours. Hgb A1c No results for input(s): HGBA1C in the last 72 hours. Lipid Profile No results for input(s): CHOL, HDL, LDLCALC, TRIG, CHOLHDL, LDLDIRECT in the last 72 hours. Thyroid  function studies No results for input(s): TSH, T4TOTAL, T3FREE, THYROIDAB in the last 72 hours.  Invalid input(s): FREET3 Anemia work up No results  for input(s): VITAMINB12, FOLATE, FERRITIN, TIBC, IRON, RETICCTPCT in the last 72 hours. Urinalysis    Component Value Date/Time   COLORURINE AMBER (A) 03/17/2024 2139   APPEARANCEUR CLEAR 03/17/2024 2139   LABSPEC 1.025 03/17/2024 2139   PHURINE 5.0 03/17/2024 2139   GLUCOSEU NEGATIVE 03/17/2024 2139   HGBUR NEGATIVE 03/17/2024 2139   BILIRUBINUR SMALL (A) 03/17/2024 2139   KETONESUR 5 (A) 03/17/2024 2139   PROTEINUR 30 (A) 03/17/2024 2139   NITRITE NEGATIVE 03/17/2024 2139   LEUKOCYTESUR NEGATIVE 03/17/2024 2139   Sepsis Labs Recent Labs  Lab 03/20/24 0321 03/21/24 0504 03/22/24 0643 03/23/24 0632  WBC 13.7* 12.0* 12.9* 12.5*   Microbiology Recent Results (from the past 240 hours)  Culture, blood (Routine X  2) w Reflex to ID Panel     Status: None   Collection Time: 03/18/24  8:57 AM   Specimen: BLOOD  Result Value Ref Range Status   Specimen Description BLOOD BLOOD LEFT ARM  Final   Special Requests   Final    BOTTLES DRAWN AEROBIC AND ANAEROBIC Blood Culture results may not be optimal due to an inadequate volume of blood received in culture bottles   Culture   Final    NO GROWTH 5 DAYS Performed at [facility], [address], [place], [place] [address]    Report Status 03/23/2024 FINAL  Final  Culture, blood (Routine X 2) w Reflex to ID Panel     Status: Abnormal   Collection Time: 03/18/24  9:12 AM   Specimen: BLOOD  Result Value Ref Range Status   Specimen Description   Final    BLOOD BLOOD LEFT HAND Performed at [facility], [address] [place], [place] [address]    Special Requests   Final    BOTTLES DRAWN AEROBIC AND ANAEROBIC Blood Culture results may not be optimal due to an inadequate volume of blood received in culture bottles Performed at [facility], [address], [place], [place] [address]    Culture  Setup Time   Final    GRAM NEGATIVE RODS IN BOTH AEROBIC AND ANAEROBIC BOTTLES Gram Stain Report Called to,Read Back By and Verified With: LEVORN LEEDS, RN [facility], [phone], AT 2238 03/18/24 BY A. SNYDER CRITICAL RESULT CALLED TO, READ BACK BY AND VERIFIED WITH: PHARMD G ABBOTT 03/19/2024 @ 0418 BY AB Performed at [facility], [address] [place], [place] [address]    Culture ESCHERICHIA COLI (A)  Final   Report Status 03/23/2024 FINAL  Final   Organism ID, Bacteria ESCHERICHIA COLI  Final   Organism ID, Bacteria ESCHERICHIA COLI  Final      Susceptibility   Escherichia coli - MIC*    AMPICILLIN  <=2 SENSITIVE Sensitive     CEFEPIME <=0.12 SENSITIVE Sensitive     CEFTAZIDIME <=1 SENSITIVE Sensitive     CEFTRIAXONE <=0.25 SENSITIVE Sensitive     CIPROFLOXACIN <=0.25 SENSITIVE Sensitive     GENTAMICIN 2 SENSITIVE Sensitive     IMIPENEM <=0.25  SENSITIVE Sensitive     TRIMETH/SULFA <=20 SENSITIVE Sensitive     AMPICILLIN /SULBACTAM <=2 SENSITIVE Sensitive     PIP/TAZO <=4 SENSITIVE Sensitive ug/mL   Escherichia coli - KIRBY BAUER*    CEFAZOLIN  SENSITIVE Sensitive     * ESCHERICHIA COLI    ESCHERICHIA COLI  Blood Culture ID Panel (Reflexed)     Status: Abnormal   Collection Time: 03/18/24  9:12 AM  Result Value Ref Range Status   Enterococcus faecalis NOT DETECTED NOT  DETECTED Final   Enterococcus Faecium NOT DETECTED NOT DETECTED Final   Listeria monocytogenes NOT DETECTED NOT DETECTED Final   Staphylococcus species NOT DETECTED NOT DETECTED Final   Staphylococcus aureus (BCID) NOT DETECTED NOT DETECTED Final   Staphylococcus epidermidis NOT DETECTED NOT DETECTED Final   Staphylococcus lugdunensis NOT DETECTED NOT DETECTED Final   Streptococcus species NOT DETECTED NOT DETECTED Final   Streptococcus agalactiae NOT DETECTED NOT DETECTED Final   Streptococcus pneumoniae NOT DETECTED NOT DETECTED Final   Streptococcus pyogenes NOT DETECTED NOT DETECTED Final   A.calcoaceticus-baumannii NOT DETECTED NOT DETECTED Final   Bacteroides fragilis NOT DETECTED NOT DETECTED Final   Enterobacterales DETECTED (A) NOT DETECTED Final    Comment: Enterobacterales represent a large order of gram negative bacteria, not a single organism. CRITICAL RESULT CALLED TO, READ BACK BY AND VERIFIED WITH: PHARMD G ABBOTT 03/19/2024 @ 0418 BY AB    Enterobacter cloacae complex NOT DETECTED NOT DETECTED Final   Escherichia coli DETECTED (A) NOT DETECTED Final    Comment: CRITICAL RESULT CALLED TO, READ BACK BY AND VERIFIED WITH: PHARMD G ABBOTT 03/19/2024 @ 0418 BY AB    Klebsiella aerogenes NOT DETECTED NOT DETECTED Final   Klebsiella oxytoca NOT DETECTED NOT DETECTED Final   Klebsiella pneumoniae NOT DETECTED NOT DETECTED Final   Proteus species NOT DETECTED NOT DETECTED Final   Salmonella species NOT DETECTED NOT DETECTED Final   Serratia  marcescens NOT DETECTED NOT DETECTED Final   Haemophilus influenzae NOT DETECTED NOT DETECTED Final   Neisseria meningitidis NOT DETECTED NOT DETECTED Final   Pseudomonas aeruginosa NOT DETECTED NOT DETECTED Final   Stenotrophomonas maltophilia NOT DETECTED NOT DETECTED Final   Candida albicans NOT DETECTED NOT DETECTED Final   Candida auris NOT DETECTED NOT DETECTED Final   Candida glabrata NOT DETECTED NOT DETECTED Final   Candida krusei NOT DETECTED NOT DETECTED Final   Candida parapsilosis NOT DETECTED NOT DETECTED Final   Candida tropicalis NOT DETECTED NOT DETECTED Final   Cryptococcus neoformans/gattii NOT DETECTED NOT DETECTED Final   CTX-M ESBL NOT DETECTED NOT DETECTED Final   Carbapenem resistance IMP NOT DETECTED NOT DETECTED Final   Carbapenem resistance KPC NOT DETECTED NOT DETECTED Final   Carbapenem resistance NDM NOT DETECTED NOT DETECTED Final   Carbapenem resist OXA 48 LIKE NOT DETECTED NOT DETECTED Final   Carbapenem resistance VIM NOT DETECTED NOT DETECTED Final    Comment: Performed at [facility], [address] [place], [place] [address]  MRSA Next Gen by PCR, Nasal     Status: None   Collection Time: 03/20/24  8:29 PM   Specimen: Nasal Mucosa; Nasal Swab  Result Value Ref Range Status   MRSA by PCR Next Gen NOT DETECTED NOT DETECTED Final    Comment: (NOTE) The GeneXpert MRSA Assay (FDA approved for NASAL specimens only), is one component of a comprehensive MRSA colonization surveillance program. It is not intended to diagnose MRSA infection nor to guide or monitor treatment for MRSA infections. Test performance is not FDA approved in patients less than 76 years old. Performed at [facility], [address], [place], [place] [address]      Time coordinating discharge: 35 minutes  SIGNED:   Sophie Mao, MD  [facility] 03/23/2024, 8:26 AM

## 2024-03-23 NOTE — Plan of Care (Signed)
  Problem: Clinical Measurements: Goal: Diagnostic test results will improve Outcome: Adequate for Discharge Goal: Respiratory complications will improve Outcome: Adequate for Discharge Goal: Cardiovascular complication will be avoided Outcome: Adequate for Discharge   Problem: Pain Managment: Goal: General experience of comfort will improve and/or be controlled Outcome: Adequate for Discharge   Problem: Education: Goal: Knowledge of General Education information will improve Description: Including pain rating scale, medication(s)/side effects and non-pharmacologic comfort measures Outcome: Completed/Met   Problem: Health Behavior/Discharge Planning: Goal: Ability to manage health-related needs will improve Outcome: Completed/Met   Problem: Clinical Measurements: Goal: Ability to maintain clinical measurements within normal limits will improve Outcome: Completed/Met Goal: Will remain free from infection Outcome: Completed/Met   Problem: Activity: Goal: Risk for activity intolerance will decrease Outcome: Completed/Met   Problem: Nutrition: Goal: Adequate nutrition will be maintained Outcome: Completed/Met   Problem: Coping: Goal: Level of anxiety will decrease Outcome: Completed/Met   Problem: Elimination: Goal: Will not experience complications related to bowel motility Outcome: Completed/Met Goal: Will not experience complications related to urinary retention Outcome: Completed/Met   Problem: Safety: Goal: Ability to remain free from injury will improve Outcome: Completed/Met   Problem: Skin Integrity: Goal: Risk for impaired skin integrity will decrease Outcome: Completed/Met

## 2024-03-23 NOTE — Progress Notes (Signed)
 Mobility Specialist Progress Note:    03/23/24 1040  Mobility  Activity Ambulated with assistance in hallway  Level of Assistance Contact guard assist, steadying assist  Assistive Device Other (Comment) (IV Pole)  Distance Ambulated (ft) 128 ft  Activity Response Tolerated well  Mobility Referral Yes  Mobility visit 1 Mobility  Mobility Specialist Start Time (ACUTE ONLY) 1006  Mobility Specialist Stop Time (ACUTE ONLY) 1012  Mobility Specialist Time Calculation (min) (ACUTE ONLY) 6 min   Received pt in bed and agreeable to mobility. No physical assistance needed. No c/o. Returned to room without fault. Pt left in bed. Personal belongings and call light within reach. All needs met.   Lavanda Pollack Mobility Specialist  Please contact via [employer] or  Rehab Office [phone]

## 2024-03-24 ENCOUNTER — Telehealth: Payer: Self-pay

## 2024-03-24 NOTE — Transitions of Care (Post Inpatient/ED Visit) (Signed)
   03/24/2024  Name: NISHAAN STANKE MRN: [id number] DOB: 1945/09/28  Today's TOC FU Call Status: Today's TOC FU Call Status:: Unsuccessful Call (1st Attempt) Unsuccessful Call (1st Attempt) Date: 03/24/24  Attempted to reach the patient regarding the most recent Inpatient/ED visit.  Follow Up Plan: Additional outreach attempts will be made to reach the patient to complete the Transitions of Care (Post Inpatient/ED visit) call.   Demario Faniel J. Elijio Staples RN, MSN [facility], [facility] RN Care Manager Direct Dial: [phone]  Fax: [phone] Website: [email or web address]

## 2024-03-25 ENCOUNTER — Telehealth: Payer: Self-pay

## 2024-03-25 NOTE — Transitions of Care (Post Inpatient/ED Visit) (Signed)
   03/25/2024  Name: SLYVESTER LATONA MRN: [id number] DOB: [birth year]  Today's TOC FU Call Status: Today's TOC FU Call Status:: Unsuccessful Call (2nd Attempt) Unsuccessful Call (2nd Attempt) Date: 03/25/24  Attempted to reach the patient regarding the most recent Inpatient/ED visit.  Follow Up Plan: Additional outreach attempts will be made to reach the patient to complete the Transitions of Care (Post Inpatient/ED visit) call.   Loella Hickle J. Ashad Fawbush RN, MSN [facility], [facility] RN Care Manager Direct Dial: [phone]  Fax: [phone] Website: [email or web address]

## 2024-03-30 ENCOUNTER — Telehealth: Payer: Self-pay

## 2024-03-30 NOTE — Transitions of Care (Post Inpatient/ED Visit) (Signed)
   03/30/2024  Name: Sean Golden MRN: [id number] DOB: 23-Dec-1945  Today's TOC FU Call Status: Today's TOC FU Call Status:: Unsuccessful Call (3rd Attempt) Unsuccessful Call (3rd Attempt) Date: 03/30/24  Attempted to reach the patient regarding the most recent Inpatient/ED visit.  Follow Up Plan: No further outreach attempts will be made at this time. We have been unable to contact the patient.   Trelon Plush J. Lucius Wise RN, MSN [facility], [facility] RN Care Manager Direct Dial: [phone]  Fax: [phone] Website: [email or web address]

## 2024-06-09 ENCOUNTER — Other Ambulatory Visit: Payer: Self-pay | Admitting: Nurse Practitioner

## 2024-06-16 NOTE — Telephone Encounter (Signed)
 Prescription refill request for Eliquis  received. Indication:afib Last office visit:3/25 Scr:0.85  7/25 Age: 78 Weight:105.1  kg  Prescription refilled

## 2024-06-16 NOTE — Telephone Encounter (Signed)
 Pt came into office and stated that his pharmacy requested a refill for his Eliquis  last week and has not heard anything back. Pt is out of medication. [phone] is the best number to reach the patient. His pharmacy is [facility].

## 2024-07-06 ENCOUNTER — Encounter: Payer: Self-pay | Admitting: Gastroenterology

## 2024-07-30 DIAGNOSIS — D649 Anemia, unspecified: Secondary | ICD-10-CM | POA: Diagnosis not present

## 2024-07-30 DIAGNOSIS — R7303 Prediabetes: Secondary | ICD-10-CM | POA: Diagnosis not present

## 2024-08-05 DIAGNOSIS — E782 Mixed hyperlipidemia: Secondary | ICD-10-CM | POA: Diagnosis not present

## 2024-08-05 DIAGNOSIS — Z0001 Encounter for general adult medical examination with abnormal findings: Secondary | ICD-10-CM | POA: Diagnosis not present

## 2024-08-05 DIAGNOSIS — Z7901 Long term (current) use of anticoagulants: Secondary | ICD-10-CM | POA: Diagnosis not present

## 2024-08-05 DIAGNOSIS — K8501 Idiopathic acute pancreatitis with uninfected necrosis: Secondary | ICD-10-CM | POA: Diagnosis not present

## 2024-08-05 DIAGNOSIS — R0600 Dyspnea, unspecified: Secondary | ICD-10-CM | POA: Diagnosis not present

## 2024-08-05 DIAGNOSIS — Z0000 Encounter for general adult medical examination without abnormal findings: Secondary | ICD-10-CM | POA: Diagnosis not present

## 2024-08-05 DIAGNOSIS — R7303 Prediabetes: Secondary | ICD-10-CM | POA: Diagnosis not present

## 2024-08-05 DIAGNOSIS — I10 Essential (primary) hypertension: Secondary | ICD-10-CM | POA: Diagnosis not present

## 2024-08-05 DIAGNOSIS — I482 Chronic atrial fibrillation, unspecified: Secondary | ICD-10-CM | POA: Diagnosis not present

## 2024-08-05 DIAGNOSIS — K219 Gastro-esophageal reflux disease without esophagitis: Secondary | ICD-10-CM | POA: Diagnosis not present

## 2024-08-05 DIAGNOSIS — D649 Anemia, unspecified: Secondary | ICD-10-CM | POA: Diagnosis not present

## 2024-08-05 DIAGNOSIS — J069 Acute upper respiratory infection, unspecified: Secondary | ICD-10-CM | POA: Diagnosis not present

## 2024-08-16 DIAGNOSIS — M546 Pain in thoracic spine: Secondary | ICD-10-CM | POA: Diagnosis not present

## 2024-08-25 ENCOUNTER — Other Ambulatory Visit: Payer: Self-pay

## 2024-08-25 ENCOUNTER — Ambulatory Visit: Admitting: Gastroenterology

## 2024-08-25 ENCOUNTER — Encounter: Payer: Self-pay | Admitting: Gastroenterology

## 2024-08-25 VITALS — BP 132/76 | HR 77 | Ht 70.0 in | Wt 231.5 lb

## 2024-08-25 DIAGNOSIS — Z9049 Acquired absence of other specified parts of digestive tract: Secondary | ICD-10-CM

## 2024-08-25 DIAGNOSIS — M545 Low back pain, unspecified: Secondary | ICD-10-CM

## 2024-08-25 DIAGNOSIS — M546 Pain in thoracic spine: Secondary | ICD-10-CM | POA: Diagnosis not present

## 2024-08-25 DIAGNOSIS — K219 Gastro-esophageal reflux disease without esophagitis: Secondary | ICD-10-CM

## 2024-08-25 DIAGNOSIS — Z7901 Long term (current) use of anticoagulants: Secondary | ICD-10-CM

## 2024-08-25 DIAGNOSIS — Z8719 Personal history of other diseases of the digestive system: Secondary | ICD-10-CM

## 2024-08-25 DIAGNOSIS — K8050 Calculus of bile duct without cholangitis or cholecystitis without obstruction: Secondary | ICD-10-CM

## 2024-08-25 NOTE — Progress Notes (Signed)
 Agree with assessment and plan as outlined.

## 2024-08-25 NOTE — Patient Instructions (Signed)
 _______________________________________________________  If your blood pressure at your visit was 140/90 or greater, please contact your primary care physician to follow up on this.  _______________________________________________________  If you are age 76 or older, your body mass index should be between 23-30. Your Body mass index is 33.22 kg/m. If this is out of the aforementioned range listed, please consider follow up with your Primary Care Provider.  If you are age 7 or younger, your body mass index should be between 19-25. Your Body mass index is 33.22 kg/m. If this is out of the aformentioned range listed, please consider follow up with your Primary Care Provider.   ________________________________________________________  The [facility] providers would like to encourage you to use MYCHART to communicate with providers for non-urgent requests or questions.  Due to long hold times on the telephone, sending your provider a message by [facility] may be a faster and more efficient way to get a response.  Please allow 48 business hours for a response.  Please remember that this is for non-urgent requests.  _______________________________________________________  [facility] is using a team-based approach to care.  Your team is made up of your doctor and two to three APPS. Our APPS (Nurse Practitioners and Physician Assistants) work with your physician to ensure care continuity for you. They are fully qualified to address your health concerns and develop a treatment plan. They communicate directly with your gastroenterologist to care for you. Seeing the Advanced Practice Practitioners on your physician's team can help you by facilitating care more promptly, often allowing for earlier appointments, access to diagnostic testing, procedures, and other specialty referrals.   Thank you for trusting me with your gastrointestinal care. Deanna May, FNP-C

## 2024-08-25 NOTE — Addendum Note (Signed)
 Addended by: VICCI FUJITA on: 08/25/2024 04:58 PM   Modules accepted: Orders

## 2024-08-25 NOTE — Progress Notes (Signed)
 Chief Complaint:back pain Primary GI Doctor: Dr. Leigh  HPI:  78 y/o male with history of status post laparoscopic cholecystectomy (June) for Biliary pancreatitis/ Choledocholithiasis who presents with main complaint of right back pain.    GI history: (June 2025) Originally presented to [facility] ED with abdominal pain in upper abdomen radiating to back. Found to have WBC 17.7, AST 247, ALT 126, Total bilirubin 2.5, lipase 2089.  CTAP W contrast showed acute pancreatitis, gallbladder stones/ polyps. MRCP showed acute interstitial pancreatitis withotu necrosis with probably choledocholithiasis in midportion of CBD.  He was then transferred to [facility] RUQ US  showed gallstones and normal CBD of 5.7mm.  Additionally patient was found to have e coli bacteremia and started on zosyn    03/18/24 Patient transferred to [facility] or [facility]ry[facility] Long given MRCP with evidence of choledocholithiasis, likely etiology of pancreatitis from [facility].   03/20/24 per Dr Jacqualine note: LAEs continue to downtrend and he continues to feel better. Suspect he likely has passed the stone from his bile duct. Spoke with surgery about his case - regarding possibility of lap chole with IOC given suspicion that stone has passed, hopefully can avoid putting him through an ERCP. They feel that is reasonable.  03/22/24 per Dr. Jacqualine note: S/p lap chole yesterday, unfortunately they could not do IOC to clear his bile duct. He feels well, LAEs downtrending. Presumably he passed the stone on its own. Scheduled MRCP to clear his bile duct per family request.  MRCP on 03/22/2024 as per GI which did not show any filling defect within the common hepatic or common bile duct.   03/23/24 pt discharged home with oral Augmentin .   Interval History Patient presents for evaluation of right upper back pain that radiates across his back.   He reports he has had the back pain for probably 6 mths or so even during  hospitalization as he thought it Romeka Scifres go away with the gallbladder surgery. He reports if he stands more than 15 minutes his back starts hurting. He sits down and the pain will resolve in 5 minutes. Denies strenuous activity. He denies symptoms with eating. Denies dysphagia or chest pain.   Patient reports he went to ortho urgent care in [place] and they did back xray. He reports they told him he had arthritis and gave him prednisone. He reports no improvement in pain.  Reports he just had lab work with Dr. Shona in November and per patient everything normal.  Denies, nausea, vomiting, or abdominal pain. Appetite good. No fever or chills  No diarrhea or constipation. No blood in stool.   Wt Readings from Last 3 Encounters:  08/25/24 231 lb 8 oz (105 kg)  03/23/24 231 lb 12.8 oz (105.1 kg)  12/16/23 241 lb 12.8 oz (109.7 kg)    Past Medical History:  Diagnosis Date   Atrial fibrillation (HCC) 05/2014   Essential hypertension    GERD (gastroesophageal reflux disease)    Helicobacter pylori gastritis 08/2014   Treated with Pylera   Pancreatitis    Sleep apnea     Past Surgical History:  Procedure Laterality Date   CARDIAC ELECTROPHYSIOLOGY STUDY AND ABLATION  2010?   CHOLECYSTECTOMY N/A 03/21/2024   Procedure: LAPAROSCOPIC CHOLECYSTECTOMY;  Surgeon: Lyndel Deward PARAS, MD;  Location: [facility] OR;  Service: General;  Laterality: N/A;   COLONOSCOPY  2005   SOLITARY RECTAL ULCER   COLONOSCOPY N/A 09/06/2014   Dr. Harvey: three colon polyps, moderate diverticulosis throughout the entire examined colon  ESOPHAGOGASTRODUODENOSCOPY N/A 09/06/2014   Dr. Fields:moderate erosive gastritis, no Barrett's. +H.pylori gastritis, treated with Pylera   ESOPHAGOGASTRODUODENOSCOPY (EGD) WITH PROPOFOL  N/A 11/17/2021   Procedure: ESOPHAGOGASTRODUODENOSCOPY (EGD) WITH PROPOFOL ;  Surgeon: Golda Claudis PENNER, MD;  Location: [facility] ENDO SUITE;  Service: Endoscopy;  Laterality: N/A;   ESOPHAGOGASTRODUODENOSCOPY  (EGD) WITH PROPOFOL  N/A 11/27/2021   Procedure: ESOPHAGOGASTRODUODENOSCOPY (EGD) WITH PROPOFOL ;  Surgeon: Eartha Angelia Sieving, MD;  Location: [facility] SUITE;  Service: Gastroenterology;  Laterality: N/A;   HEMORRHOID BANDING N/A 09/06/2014   NO BANDING PERFORMED. NO INTERNAL HEMORRHOIDS IDENTIFIED    UPPER GASTROINTESTINAL ENDOSCOPY  2005 [facility]   VASECTOMY      Current Outpatient Medications  Medication Sig Dispense Refill   acetaminophen  (TYLENOL ) 325 MG tablet Take 2 tablets (650 mg total) by mouth every 6 (six) hours as needed for mild pain, fever or headache (or Fever >/= 101). (Patient taking differently: Take 1,000 mg by mouth every 6 (six) hours as needed for mild pain (pain score 1-3), fever or headache (or Fever >/= 101).)     diltiazem  (CARDIZEM  CD) 300 MG 24 hr capsule Take 1 capsule (300 mg total) by mouth daily. 90 capsule 3   diltiazem  (CARDIZEM ) 120 MG tablet Take 120 mg by mouth 3 (three) times daily.     ELIQUIS  5 MG TABS tablet TAKE ONE TABLET (5MG  TOTAL) BY MOUTH TWOTIMES DAILY 60 tablet 5   ondansetron  (ZOFRAN ) 4 MG tablet Take 1 tablet (4 mg total) by mouth every 8 (eight) hours as needed for nausea or vomiting. 20 tablet 0   oxyCODONE  (OXY IR/ROXICODONE ) 5 MG immediate release tablet Take 1 tablet (5 mg total) by mouth every 6 (six) hours as needed (pain). 15 tablet 0   pantoprazole  (PROTONIX ) 40 MG tablet Take 1 tablet (40 mg total) by mouth 2 (two) times daily. 60 tablet 11   predniSONE (DELTASONE) 10 MG tablet Take 10 mg by mouth daily with breakfast.     spironolactone  (ALDACTONE ) 25 MG tablet Take 1 tablet (25 mg total) by mouth daily. 90 tablet 3   tamsulosin  (FLOMAX ) 0.4 MG CAPS capsule Take 2 capsules (0.8 mg total) by mouth every evening. 60 capsule 2   No current facility-administered medications for this visit.    Allergies as of 08/25/2024   (No Known Allergies)    Family History  Problem Relation Age of Onset   Hypertension Mother    Heart attack  Father    Hyperlipidemia Sister    Hyperlipidemia Brother    Colon polyps Brother    Hypertension Brother    Hypertension Sister    Diabetes Brother    Stroke Other    Diabetes Other    Hypertension Other    Hyperlipidemia Other    Colon cancer Neg Hx     Review of Systems:    Constitutional: No weight loss, fever, chills, weakness or fatigue HEENT: Eyes: No change in vision               Ears, Nose, Throat:  No change in hearing or congestion Skin: No rash or itching Cardiovascular: No chest pain, chest pressure or palpitations   Respiratory: No SOB or cough Gastrointestinal: See HPI and otherwise negative Genitourinary: No dysuria or change in urinary frequency Neurological: No headache, dizziness or syncope Musculoskeletal: No new muscle or joint pain Hematologic: No bleeding or bruising Psychiatric: No history of depression or anxiety    Physical Exam:  Vital signs: BP 132/76 (BP Location: Left Arm, Patient Position:  Sitting, Cuff Size: Normal)   Pulse 77   Ht 5' 10 (1.778 m)   Wt 231 lb 8 oz (105 kg)   BMI 33.22 kg/m   Constitutional:   Pleasant male appears to be in NAD, Well developed, Well nourished, alert and cooperative Eyes:   PEERL, EOMI. No icterus. Conjunctiva pink. Neck:  Supple Throat: Oral cavity and pharynx without inflammation, swelling or lesion.  Respiratory: Respirations even and unlabored. Lungs clear to auscultation bilaterally.   No wheezes, crackles, or rhonchi.  Cardiovascular: Normal S1, S2. Regular rate and rhythm. No peripheral edema, cyanosis or pallor.  Gastrointestinal:  Soft, nondistended, nontender. No rebound or guarding. Normal bowel sounds. No appreciable masses or hepatomegaly. Rectal:  Not performed.  Msk:  Symmetrical without gross deformities. Without edema, no deformity or joint abnormality.  Neurologic:  Alert and  oriented x4;  grossly normal neurologically.  Skin:   Dry and intact without significant lesions or  rashes.  RELEVANT LABS AND IMAGING: CBC    Latest Ref Rng & Units 03/23/2024    6:32 AM 03/22/2024    6:43 AM 03/21/2024    5:04 AM  CBC  WBC 4.0 - 10.5 K/uL 12.5  12.9  12.0   Hemoglobin 13.0 - 17.0 g/dL 85.4  84.8  85.5   Hematocrit 39.0 - 52.0 % 42.0  43.7  42.7   Platelets 150 - 400 K/uL 258  242  217      CMP     Latest Ref Rng & Units 03/23/2024    6:32 AM 03/22/2024    6:43 AM 03/21/2024    5:04 AM  CMP  Glucose 70 - 99 mg/dL 867  824  889   BUN 8 - 23 mg/dL 12  8  6    Creatinine 0.61 - 1.24 mg/dL 9.14  9.25  9.27   Sodium 135 - 145 mmol/L 134  131  135   Potassium 3.5 - 5.1 mmol/L 3.7  3.9  3.2   Chloride 98 - 111 mmol/L 105  104  107   CO2 22 - 32 mmol/L 20  17  20    Calcium  8.9 - 10.3 mg/dL 8.1  8.2  8.0   Total Protein 6.5 - 8.1 g/dL 5.7  6.0  5.5   Total Bilirubin 0.0 - 1.2 mg/dL 1.4  2.0  2.4   Alkaline Phos 38 - 126 U/L 94  101  92   AST 15 - 41 U/L 24  28  20    ALT 0 - 44 U/L 42  54  61      Lab Results  Component Value Date   TSH 2.16 08/11/2019    09/2023 echo- Left ventricular ejection fraction, by estimation, is 60 to 65%.   GI procedures: 11/2021 EGD with Dr. Eartha - A gastric tube was found in the esophagus. - 2 cm hiatal hernia. - Erythematous mucosa in the anterior wall of the stomach and antrum. - Duodenitis. - No specimens collected.  10/2021 EGD - Normal hypopharynx. - Normal esophagus. - Z- line irregular, 43 cm from the incisors. - Hematin ( altered blood/ coffee- ground- like material) in the gastric fundus. Most this was suctioned out. - Erythematous, eroded and petechial mucosa in the antrum. These changes were felt to be due to NG tube trauma - Normal pylorus. - Non- bleeding duodenal ulcer with no stigmata of bleeding. - Duodenitis involving second part of the duodenum with noncritical luminal narrowing. - Normal third portion of the duodenum. - No  specimens collected.  08/2014 EGD Endoscopic impression No Barrett's esophagus Moderate  erosive gastritis  08/2014 colonoscopy at [facility] ENDOSCOPIC IMPRESSION: 1. THREE COLON polyps REMOVED 2. RECTAL ITCHING/ BLEEDING DUE TO PERI- ANAL SKIN DISEASE. NO INTERNAL HMEORRHOIDS IDENTIFIED. 3. Moderate diverticulosis Mthroughout the entire examined colon 4. RECTAL DISCOMFORT MOST LIKELY DUE TO EXTERNALHEMORRHOIDS  03/22/24 MRCP IMPRESSION: 1. Status post interval cholecystectomy with expected postoperative fat stranding and fluid in the gallbladder fossa. 2. No filling defect within the common hepatic or common bile ducts on today's examination. Common hepatic duct measures up to 0.6 cm in caliber, and the common bile duct tapers smoothly to the ampulla without calculus or other obstruction. 3. Near complete resolution of previously seen inflammatory findings about the pancreas. No pancreatic ductal dilatation. 4. Mildly expansile, edematous, masslike appearance of the pancreatic uncinate, this region measuring 2.7 x 1.7 cm. Mild associated diffusion restriction and hypoenhancement. Appearance is generally similar to multiple other prior examinations and this favors residual pancreatitis and/or chronic sequelae thereof. Consider short interval follow-up to ensure stability or resolution and exclude underlying mass. 5. Trace bilateral pleural effusions and associated atelectasis or consolidation. 6. Pancolonic diverticulosis without evidence of acute diverticulitis.   03/18/24 US  abd RUQ IMPRESSION: 1. Cholelithiasis without evidence of acute cholecystitis. 2. Hepatic steatosis.  03/18/24 MRCP IMPRESSION: 1. Acute interstitial pancreatitis. No pancreatic necrosis or peripancreatic/intrapancreatic collections. No main pancreatic duct dilation. No intra or extrahepatic bile duct dilation. There is a probable single 4 x 7 mm choledocholithiasis in the midportion of common bile duct. Small volume cholelithiasis without acute cholecystitis. 2. Multiple other nonacute  observations, as described above.  03/17/24 CTAP IMPRESSION: 1. Acute pancreatitis. 2. Gallbladder stones or polyps. Pericholecystic fat stranding. 3. Diverticulosis. 4. Small periumbilical abdominal wall defect containing omental fat. 5. Aortic atherosclerosis (ICD10-I70.0).   Assessment: Encounter Diagnoses  Name Primary?   Right-sided thoracic back pain, unspecified chronicity Yes   Gastroesophageal reflux disease without esophagitis    Chronic anticoagulation    History of laparoscopic cholecystectomy    History of pancreatitis    78 year old male patient status post laparoscopic cholecystectomy (June) for Biliary pancreatitis/ Choledocholithiasis who presents with main complaint of right back pain. Discussed case with Doc of Day Dr. Charlanne and will order following:  #1 Right back pain that radiates, worse with standing, better with sitting. #2 status post laparoscopic cholecystectomy for Biliary pancreatitis/ Choledocholithiasis - repeat MRCP - check CBC, hepatic panel, lipase, and IgG4   #3GERD -on PPI twice daily  #4 Persistent A-fib --on Eliquis   --AFIB on physical exam today- no chest pain or dizziness. Notified his cardiologist I was him today and presenting symptoms.    Thank you for the courtesy of this consult. Please call me with any questions or concerns.   Kamron Portee, FNP-C [facility] 08/25/2024, 1:08 PM  Cc: [name], MD

## 2024-08-26 ENCOUNTER — Other Ambulatory Visit

## 2024-08-26 ENCOUNTER — Ambulatory Visit: Payer: Self-pay | Admitting: Gastroenterology

## 2024-08-26 ENCOUNTER — Telehealth: Payer: Self-pay

## 2024-08-26 DIAGNOSIS — K8050 Calculus of bile duct without cholangitis or cholecystitis without obstruction: Secondary | ICD-10-CM

## 2024-08-26 DIAGNOSIS — K219 Gastro-esophageal reflux disease without esophagitis: Secondary | ICD-10-CM | POA: Diagnosis not present

## 2024-08-26 DIAGNOSIS — Z8719 Personal history of other diseases of the digestive system: Secondary | ICD-10-CM | POA: Diagnosis not present

## 2024-08-26 DIAGNOSIS — Z7901 Long term (current) use of anticoagulants: Secondary | ICD-10-CM | POA: Diagnosis not present

## 2024-08-26 DIAGNOSIS — M546 Pain in thoracic spine: Secondary | ICD-10-CM | POA: Diagnosis not present

## 2024-08-26 DIAGNOSIS — Z9049 Acquired absence of other specified parts of digestive tract: Secondary | ICD-10-CM

## 2024-08-26 DIAGNOSIS — M545 Low back pain, unspecified: Secondary | ICD-10-CM

## 2024-08-26 LAB — HEPATIC FUNCTION PANEL
ALT: 14 U/L (ref 0–53)
AST: 11 U/L (ref 0–37)
Albumin: 4.3 g/dL (ref 3.5–5.2)
Alkaline Phosphatase: 76 U/L (ref 39–117)
Bilirubin, Direct: 0.1 mg/dL (ref 0.0–0.3)
Total Bilirubin: 0.6 mg/dL (ref 0.2–1.2)
Total Protein: 6.9 g/dL (ref 6.0–8.3)

## 2024-08-26 LAB — CBC WITH DIFFERENTIAL/PLATELET
Basophils Absolute: 0 K/uL (ref 0.0–0.1)
Basophils Relative: 0.3 % (ref 0.0–3.0)
Eosinophils Absolute: 0 K/uL (ref 0.0–0.7)
Eosinophils Relative: 0.3 % (ref 0.0–5.0)
HCT: 47.9 % (ref 39.0–52.0)
Hemoglobin: 16.1 g/dL (ref 13.0–17.0)
Lymphocytes Relative: 9.7 % — ABNORMAL LOW (ref 12.0–46.0)
Lymphs Abs: 1.2 K/uL (ref 0.7–4.0)
MCHC: 33.7 g/dL (ref 30.0–36.0)
MCV: 91.7 fl (ref 78.0–100.0)
Monocytes Absolute: 0.9 K/uL (ref 0.1–1.0)
Monocytes Relative: 7.1 % (ref 3.0–12.0)
Neutro Abs: 10.7 K/uL — ABNORMAL HIGH (ref 1.4–7.7)
Neutrophils Relative %: 82.6 % — ABNORMAL HIGH (ref 43.0–77.0)
Platelets: 279 K/uL (ref 150.0–400.0)
RBC: 5.22 Mil/uL (ref 4.22–5.81)
RDW: 13.9 % (ref 11.5–15.5)
WBC: 12.9 K/uL — ABNORMAL HIGH (ref 4.0–10.5)

## 2024-08-26 LAB — LIPASE: Lipase: 7 U/L — ABNORMAL LOW (ref 11.0–59.0)

## 2024-08-26 NOTE — Telephone Encounter (Signed)
 See telephone encounter.

## 2024-08-26 NOTE — Telephone Encounter (Signed)
 Inbound call to patient requesting to speak to [facility] in regards to labs and imaging orders. Please advise.

## 2024-08-27 ENCOUNTER — Other Ambulatory Visit (HOSPITAL_COMMUNITY): Payer: Self-pay | Admitting: Otolaryngology

## 2024-08-27 DIAGNOSIS — M546 Pain in thoracic spine: Secondary | ICD-10-CM

## 2024-08-27 LAB — IGG 4: IgG, Subclass 4: 41 mg/dL (ref 2–96)

## 2024-08-28 ENCOUNTER — Ambulatory Visit (HOSPITAL_COMMUNITY): Admission: RE | Admit: 2024-08-28 | Discharge: 2024-08-28 | Attending: Gastroenterology

## 2024-08-28 ENCOUNTER — Other Ambulatory Visit: Payer: Self-pay | Admitting: Gastroenterology

## 2024-08-28 DIAGNOSIS — M545 Low back pain, unspecified: Secondary | ICD-10-CM

## 2024-08-28 DIAGNOSIS — Z8719 Personal history of other diseases of the digestive system: Secondary | ICD-10-CM

## 2024-08-28 DIAGNOSIS — K8050 Calculus of bile duct without cholangitis or cholecystitis without obstruction: Secondary | ICD-10-CM

## 2024-08-28 DIAGNOSIS — D1809 Hemangioma of other sites: Secondary | ICD-10-CM | POA: Diagnosis not present

## 2024-08-28 DIAGNOSIS — Z9049 Acquired absence of other specified parts of digestive tract: Secondary | ICD-10-CM | POA: Diagnosis not present

## 2024-08-28 MED ORDER — GADOBUTROL 1 MMOL/ML IV SOLN
10.0000 mL | Freq: Once | INTRAVENOUS | Status: AC | PRN
  Administered 2024-08-28: 10 mL via INTRAVENOUS

## 2024-08-31 ENCOUNTER — Ambulatory Visit: Payer: Self-pay | Admitting: Gastroenterology

## 2024-09-01 DIAGNOSIS — Z23 Encounter for immunization: Secondary | ICD-10-CM | POA: Diagnosis not present

## 2024-09-03 DIAGNOSIS — M546 Pain in thoracic spine: Secondary | ICD-10-CM | POA: Diagnosis not present

## 2024-09-09 ENCOUNTER — Telehealth (HOSPITAL_BASED_OUTPATIENT_CLINIC_OR_DEPARTMENT_OTHER): Payer: Self-pay | Admitting: *Deleted

## 2024-09-09 NOTE — Telephone Encounter (Signed)
 Pharmacy please advise on holding Eliquis  prior to [facility]; THORACIC INTERLAMINAR EPIDURAL STEROID INJECTION scheduled for TBD. Thank you.   Last labs: 08/26/2024

## 2024-09-09 NOTE — Telephone Encounter (Signed)
° °  Pre-operative Risk Assessment    Patient Name: Sean Golden  DOB: [birth year] MRN: [id number]   Date of last office visit: 12/16/23 DAYNA DUNN, [facility]  Date of next office visit: 10/13/24 LAYMON QUA, [facility]   Request for Surgical Clearance    Procedure:  ESI; THORACIC INTERLAMINAR EPIDURAL STEROID INJECTION  Date of Surgery:  Clearance TBD                                Surgeon:  DR. LAQUETA Surgeon's Group or Practice Name:  [facility] Phone number:  [phone] ATTN: DIAMOND RAMAN.  Fax number:  [phone]   Type of Clearance Requested:   - Medical  - Pharmacy:  Hold Apixaban  (Eliquis ) x 3 DAYS PRIOR   Type of Anesthesia:  Not Indicated   Additional requests/questions:    Bonney Niels Jest   09/09/2024, 11:07 AM

## 2024-09-14 NOTE — Telephone Encounter (Signed)
 Patient with diagnosis of afib on Eliquis  for anticoagulation.    Procedure:  ESI; THORACIC INTERLAMINAR EPIDURAL STEROID INJECTION   Date of Surgery:  Clearance TBD    CHA2DS2-VASc Score = 4   This indicates a 4.8% annual risk of stroke. The patient's score is based upon: CHF History: 0 HTN History: 1 Diabetes History: 0 Stroke History: 0 Vascular Disease History: 1 Age Score: 2 Gender Score: 0    CrCl 90 ml/min Platelet count 279 K  Patient has not had an Afib/aflutter ablation in the last 3 months, DCCV within the last 4 weeks or a watchman implanted in the last 45 days   Per office protocol, patient can hold Eliquis  for 3 days prior to procedure.   Patient will not need bridging with Lovenox (enoxaparin) around procedure.  **This guidance is not considered finalized until pre-operative APP has relayed final recommendations.**

## 2024-09-14 NOTE — Telephone Encounter (Signed)
Left message to call back and schedule tele pre op appt

## 2024-09-14 NOTE — Telephone Encounter (Signed)
" ° °  Name: Sean Golden  DOB: 1945/10/09  MRN: [id number]  Primary Cardiologist: Jayson Sierras, MD   Preoperative team, please contact this patient and set up a phone call appointment for further preoperative risk assessment. Please obtain consent and complete medication review. Thank you for your help.  I confirm that guidance regarding antiplatelet and oral anticoagulation therapy has been completed and, if necessary, noted below.  Per Pharm D, patient has not had an Afib/aflutter ablation within the last 3 months, DCCV within the last 4 weeks, or Watchman in the last 45 days. Patient may hold Eliquis  for 3 days prior to procedure.  Patient will not need bridging with Lovenox around procedure.    I also confirmed the patient resides in the state of [place] . As per [facility] Medical Board telemedicine laws, the patient must reside in the state in which the provider is licensed.    Barnie Hila, NP 09/14/2024, 12:52 PM [facility]     "

## 2024-09-15 ENCOUNTER — Telehealth: Payer: Self-pay

## 2024-09-15 NOTE — Telephone Encounter (Signed)
 Tried contacting patient to schedule TELEVISIT no answer left a detailed vm to call back and schedule

## 2024-09-15 NOTE — Telephone Encounter (Signed)
 Patient returned Pre-op call.

## 2024-09-15 NOTE — Telephone Encounter (Signed)
 Patient called and scheduled for pre-op clearance on 09/17/24 with Josefa Beauvais, NP. No questions at the given time.     Patient Consent for Virtual Visit        Sean Golden has provided verbal consent on 09/15/2024 for a virtual visit (video or telephone).   CONSENT FOR VIRTUAL VISIT FOR:  Sean Golden  By participating in this virtual visit I agree to the following:  I hereby voluntarily request, consent and authorize [facility] and its employed or contracted physicians, physician assistants, nurse practitioners or other licensed health care professionals (the Practitioner), to provide me with telemedicine health care services (the Services) as deemed necessary by the treating Practitioner. I acknowledge and consent to receive the Services by the Practitioner via telemedicine. I understand that the telemedicine visit will involve communicating with the Practitioner through live audiovisual communication technology and the disclosure of certain medical information by electronic transmission. I acknowledge that I have been given the opportunity to request an in-person assessment or other available alternative prior to the telemedicine visit and am voluntarily participating in the telemedicine visit.  I understand that I have the right to withhold or withdraw my consent to the use of telemedicine in the course of my care at any time, without affecting my right to future care or treatment, and that the Practitioner or I may terminate the telemedicine visit at any time. I understand that I have the right to inspect all information obtained and/or recorded in the course of the telemedicine visit and may receive copies of available information for a reasonable fee.  I understand that some of the potential risks of receiving the Services via telemedicine include:  Delay or interruption in medical evaluation due to technological equipment failure or disruption; Information transmitted  may not be sufficient (e.g. poor resolution of images) to allow for appropriate medical decision making by the Practitioner; and/or  In rare instances, security protocols could fail, causing a breach of personal health information.  Furthermore, I acknowledge that it is my responsibility to provide information about my medical history, conditions and care that is complete and accurate to the best of my ability. I acknowledge that Practitioner's advice, recommendations, and/or decision may be based on factors not within their control, such as incomplete or inaccurate data provided by me or distortions of diagnostic images or specimens that may result from electronic transmissions. I understand that the practice of medicine is not an exact science and that Practitioner makes no warranties or guarantees regarding treatment outcomes. I acknowledge that a copy of this consent can be made available to me via my patient portal [facility] MyChart), or I can request a printed copy by calling the office of [facility].    I understand that my insurance will be billed for this visit.   I have read or had this consent read to me. I understand the contents of this consent, which adequately explains the benefits and risks of the Services being provided via telemedicine.  I have been provided ample opportunity to ask questions regarding this consent and the Services and have had my questions answered to my satisfaction. I give my informed consent for the services to be provided through the use of telemedicine in my medical care

## 2024-09-17 ENCOUNTER — Ambulatory Visit: Attending: Cardiovascular Disease

## 2024-09-17 DIAGNOSIS — Z01810 Encounter for preprocedural cardiovascular examination: Secondary | ICD-10-CM | POA: Diagnosis not present

## 2024-09-17 NOTE — Progress Notes (Signed)
 "   Virtual Visit via Telephone Note   Because of Sean Golden co-morbid illnesses, he is at least at moderate risk for complications without adequate follow up.  This format is felt to be most appropriate for this patient at this time.  Due to technical limitations with video connection web designer), today's appointment will be conducted as an audio only telehealth visit, and Sean Golden verbally agreed to proceed in this manner.   All issues noted in this document were discussed and addressed.  No physical exam could be performed with this format.  Evaluation Performed:  Preoperative cardiovascular risk assessment _____________   Date:  09/17/2024   Patient ID:  Sean Golden, DOB [birth year], MRN [id number] Patient Location:  Home Provider location:   Office  Primary Care Provider:  [name], MD Primary Cardiologist:  Sean Sierras, MD  Chief Complaint / Patient Profile   78 y.o. y/o male with a h/o of permanent a-fib, DOE, HTN, OSA, GERD, and moderate LVH by echo,  who is pending thoracic interlaminar epidural steroid injection, and presents today for telephonic preoperative cardiovascular risk assessment.  History of Present Illness    Sean Golden is a 78 y.o. male who presents via audio/video conferencing for a telehealth visit today.  Pt was last seen in cardiology clinic on 12/16/23 by Sean Dunn, PA-C. At that time Sean Golden was doing well.  The patient is now pending procedure as outlined above. Since his last visit, he continues to be stable from a cardiac standpoint.  He denies chest pain, shortness of breath, lower extremity edema, fatigue, palpitations, melena, hematuria, hemoptysis, diaphoresis, weakness, presyncope, syncope, orthopnea, and PND.   Past Medical History    Past Medical History:  Diagnosis Date   Atrial fibrillation (HCC) 05/2014   Essential hypertension    GERD (gastroesophageal reflux disease)    Helicobacter pylori gastritis  08/2014   Treated with Pylera   Pancreatitis    Sleep apnea    Past Surgical History:  Procedure Laterality Date   CARDIAC ELECTROPHYSIOLOGY STUDY AND ABLATION  2010?   CHOLECYSTECTOMY Golden/A 03/21/2024   Procedure: LAPAROSCOPIC CHOLECYSTECTOMY;  Surgeon: Sean Deward PARAS, MD;  Location: [facility] OR;  Service: General;  Laterality: Golden/A;   COLONOSCOPY  2005   SOLITARY RECTAL ULCER   COLONOSCOPY Golden/A 09/06/2014   Dr. Harvey: three colon polyps, moderate diverticulosis throughout the entire examined colon   ESOPHAGOGASTRODUODENOSCOPY Golden/A 09/06/2014   Dr. Fields:moderate erosive gastritis, no Barrett's. +H.pylori gastritis, treated with Pylera   ESOPHAGOGASTRODUODENOSCOPY (EGD) WITH PROPOFOL  Golden/A 11/17/2021   Procedure: ESOPHAGOGASTRODUODENOSCOPY (EGD) WITH PROPOFOL ;  Surgeon: Sean Claudis PENNER, MD;  Location: [facility];  Service: Endoscopy;  Laterality: Golden/A;   ESOPHAGOGASTRODUODENOSCOPY (EGD) WITH PROPOFOL  Golden/A 11/27/2021   Procedure: ESOPHAGOGASTRODUODENOSCOPY (EGD) WITH PROPOFOL ;  Surgeon: Sean Angelia Sieving, MD;  Location: [facility];  Service: Gastroenterology;  Laterality: Golden/A;   HEMORRHOID BANDING Golden/A 09/06/2014   NO BANDING PERFORMED. NO INTERNAL HEMORRHOIDS IDENTIFIED    UPPER GASTROINTESTINAL ENDOSCOPY  2005 [facility]   VASECTOMY      Allergies  Allergies[1]  Home Medications    Prior to Admission medications  Medication Sig Start Date End Date Taking? Authorizing Provider  acetaminophen  (TYLENOL ) 325 MG tablet Take 2 tablets (650 mg total) by mouth every 6 (six) hours as needed for mild pain, fever or headache (or Fever >/= 101). 11/12/21   Johnson, Clanford L, MD  diltiazem  (CARDIZEM  CD) 300 MG 24 hr capsule Take 1 capsule (  300 mg total) by mouth daily. 11/14/23   Golden, Sean N, PA-C  diltiazem  (CARDIZEM ) 120 MG tablet Take 120 mg by mouth 3 (three) times daily. Patient not taking: Reported on 09/15/2024    [provider]  ELIQUIS  5 MG TABS tablet TAKE ONE TABLET (5MG   TOTAL) BY MOUTH TWOTIMES DAILY 06/16/24   Sean Sean MATSU, MD  ondansetron  (ZOFRAN ) 4 MG tablet Take 1 tablet (4 mg total) by mouth every 8 (eight) hours as needed for nausea or vomiting. Patient not taking: Reported on 09/15/2024 03/23/24   Sean Page, MD  oxyCODONE  (OXY IR/ROXICODONE ) 5 MG immediate release tablet Take 1 tablet (5 mg total) by mouth every 6 (six) hours as needed (pain). Patient not taking: Reported on 09/15/2024 03/23/24   Sean Sor, PA-C  pantoprazole  (PROTONIX ) 40 MG tablet Take 1 tablet (40 mg total) by mouth 2 (two) times daily. 12/11/22 12/15/96  Sean Carlin POUR, DO  penicillin v potassium (VEETID) 500 MG tablet Take 500 mg by mouth 3 (three) times daily. 09/07/24   [provider]  predniSONE (DELTASONE) 10 MG tablet Take 10 mg by mouth daily with breakfast. Patient not taking: Reported on 09/15/2024 08/16/24   [provider]  spironolactone  (ALDACTONE ) 25 MG tablet Take 1 tablet (25 mg total) by mouth daily. Patient not taking: Reported on 09/15/2024 11/04/23 08/25/24  Golden, Sean N, PA-C  tamsulosin  (FLOMAX ) 0.4 MG CAPS capsule Take 2 capsules (0.8 mg total) by mouth every evening. 11/12/21   Vicci Afton CROME, MD    Physical Exam    Vital Signs:  Sean Golden does not have vital signs available for review today.  Given telephonic nature of communication, physical exam is limited. AAOx3. NAD. Normal affect.  Speech and respirations are unlabored.  Accessory Clinical Findings    None  Assessment & Plan    1.  Preoperative Cardiovascular Risk Assessment:  Procedure:  ESI; THORACIC INTERLAMINAR EPIDURAL STEROID INJECTION Date of Surgery:  Clearance TBD                              Surgeon:  Sean Golden Surgeon's Group or Practice Name:  Sean Golden Phone number:  [phone] ATTN: Sean Golden.  Fax number:  [phone]  Primary Cardiologist: Sean Debera, MD  Chart reviewed as part of pre-operative protocol coverage. Given  past medical history and time since last visit, based on ACC/AHA guidelines, Sean Golden would be at acceptable risk for the planned procedure without further cardiovascular testing.   According to the Revised Cardiac Risk Index (RCRI), his Perioperative Risk of Major Cardiac Event is (%): 0.4 His Functional Capacity in METs is: 4.64 according to the Duke Activity Status Index (DASI).  Per office protocol, patient can hold Eliquis  for 3 days prior to procedure.   Patient will not need bridging with Lovenox (enoxaparin) around procedure.  Patient was advised that if he develops new symptoms prior to surgery to contact our office to arrange a follow-up appointment.  He verbalized understanding.  I will route this recommendation to the requesting party via Epic fax function and remove from pre-op pool.  Time:   Today, I have spent 7 minutes with the patient with telehealth technology discussing medical history, symptoms, and management plan. I spent 10 minutes reviewing patient's past cardiac history and cardiac medications.  Mardy KATHEE Pizza, FNP  09/17/2024, 10:02 AM     [1] No Known Allergies  "

## 2024-09-29 ENCOUNTER — Encounter: Payer: Self-pay | Admitting: *Deleted

## 2024-09-29 NOTE — Progress Notes (Signed)
 Sean Golden                                          MRN: [id number]   09/29/2024   The [facility]BCI Quality Team Specialist reviewed this patient medical record for the purposes of chart review for care gap closure. The following were reviewed: abstraction for care gap closure-controlling blood pressure.    VBCI Quality Team

## 2024-10-12 NOTE — Progress Notes (Unsigned)
 "  Cardiology Office Note    Date:  10/14/2024  ID:  Sean Golden, Sean Golden 1946/01/07, MRN [id number] Cardiologist: Jayson Sierras, MD { : History of Present Illness:    Sean Golden is a 79 y.o. male with past medical history of permanent atrial fibrillation, coronary artery calcification (low-risk NST in 10/2023), HTN, OSA and GERD who presents to the office today for overdue follow-up.  He was examined by Raphael Bring, PA in 11/2023 and reported improvement in his shortness of breath since his last office visit and recent NST had overall been reassuring and showed no evidence of ischemia. It was felt that his dyspnea was likely multifactorial in the setting of obesity, diastolic dysfunction, permanent atrial fibrillation, bronchiectasis and hypertension.  Symptoms had improved with the addition of Spironolactone  and titration of Cardizem , therefore further testing was not pursued.  He was evaluated by Cardiology during admission in 02/2024 for acute on chronic gallstone pancreatitis. Was cleared from a cardiac perspective to proceed with upcoming laparoscopic cholecystectomy. No immediate complications were noted. He also underwent a telephone preoperative appointment in 08/2024 for thoracic steroid injection and was cleared to proceed. Pharmacy recommended holding Eliquis  3 days prior to his procedure.  In talking with the patient today, he reports having baseline fatigue which has been occurring for several years. Over the past 7 to 8 months, he has developed pain along his back which radiates into his chest at times. This occurred around the timeframe that he underwent surgery but reports having a follow-up MRI with no acute findings. He has also been evaluated by orthopedics with plans for a spinal injection but this was not arranged. His pain is not necessarily worse with turning or lifting objects. Reports his back pain occurs when walking and then improves when he stops to rest. He has baseline  dyspnea on exertion with no acute changes in this. No specific orthopnea, PND or pitting edema. He did stop Spironolactone  as he reports his blood pressure was too low and he did not feel that this was helping with the symptoms. He has overall been asymptomatic with his atrial fibrillation.  Studies Reviewed:   EKG: EKG is ordered today and demonstrates:   EKG Interpretation Date/Time:  Wednesday October 13 2024 11:05:17 EST Ventricular Rate:  83 PR Interval:    QRS Duration:  84 QT Interval:  380 QTC Calculation: 446 R Axis:   -36  Text Interpretation: Atrial fibrillation Left axis deviation Confirmed by Johnson Grate ([id number]) on 10/13/2024 11:09:35 AM       Echocardiogram: 09/2023 IMPRESSIONS     1. Left ventricular ejection fraction, by estimation, is 60 to 65%. The  left ventricle has normal function. The left ventricle has no regional  wall motion abnormalities. There is moderate left ventricular hypertrophy.  Left ventricular diastolic  parameters are indeterminate.   2. Right ventricular systolic function is normal. The right ventricular  size is normal.   3. Left atrial size was severely dilated.   4. Right atrial size was moderately dilated.   5. The mitral valve is normal in structure. Trivial mitral valve  regurgitation. No evidence of mitral stenosis.   6. The aortic valve is tricuspid. Aortic valve regurgitation is not  visualized. No aortic stenosis is present.   7. The inferior vena cava is normal in size with greater than 50%  respiratory variability, suggesting right atrial pressure of 3 mmHg.    NST: 10/2023   Findings are consistent with no ischemia. The study  is low risk.   No ST deviation was noted. The ECG was negative for ischemia.   LV perfusion is normal.  No significant myocardial perfusion defects to indicate scar or ischemia in the setting of diaphragmatic attenuation.   Left ventricular function is normal. Nuclear stress EF: 71%.  Risk  Assessment/Calculations:    CHA2DS2-VASc Score = 4 This indicates a 4.8% annual risk of stroke. The patient's score is based upon: CHF History: 0 HTN History: 1 Diabetes History: 0 Stroke History: 0 Vascular Disease History: 1 Age Score: 2 Gender Score: 0    HYPERTENSION CONTROL Vitals:   10/13/24 1100 10/13/24 1125  BP: (!) 140/92 (!) 122/90    The patient's blood pressure is elevated above target today. In order to address the patient's elevated BP: Blood pressure will be monitored at home to determine if medication changes need to be made.       Physical Exam:   VS:  BP (!) 122/90   Pulse 83   Ht 5' 10 (1.778 m)   Wt 235 lb 6.4 oz (106.8 kg)   SpO2 98%   BMI 33.78 kg/m    Wt Readings from Last 3 Encounters:  10/13/24 235 lb 6.4 oz (106.8 kg)  08/25/24 231 lb 8 oz (105 kg)  03/23/24 231 lb 12.8 oz (105.1 kg)     GEN: Pleasant male appearing in no acute distress NECK: No JVD; No carotid bruits CARDIAC: Irregularly irregular, no murmurs, rubs, gallops RESPIRATORY:  Clear to auscultation without rales, wheezing or rhonchi  ABDOMEN: Appears non-distended. No obvious abdominal masses. EXTREMITIES: No clubbing or cyanosis. No pitting edema.  Distal pedal pulses are 2+ bilaterally.   Assessment and Plan:   1. Permanent atrial fibrillation (HCC) - He denies any recent palpitations and heart rate is well-controlled in the 80's today. Continue Cardizem  CD 300 mg daily for rate-control. - No reports of active bleeding. Continue Eliquis  5 mg twice daily for anticoagulation which is the correct dose given his current age (79 years old), weight (235 lbs) and renal function (creatinine at 0.87 when checked in 07/2024). CBC in 08/2024 showed his hemoglobin was stable at 16.1 with platelets at 279 K.   2. Coronary artery calcification - He did have coronary calcification by prior CT imaging and did have a low-risk NST in 10/2023. He reports episodes of back pain which can  radiate into his chest at times and occurs with activity. He is scheduled for an upcoming spinal injection and is hopeful this will help with his symptoms. If no improvement, I encouraged him to make us  aware as we could arrange for a Coronary CTA to definitively rule out an ischemic etiology given the exertional component of his symptoms.  3. Essential hypertension - His blood pressure was initially at 140/92, rechecked and at 122/90. Will continue to follow in the ambulatory setting. Continue Cardizem  CD 300 mg daily. He self-discontinued Spironolactone  as he felt that this was contributing to hypotension.  Signed, Laymon CHRISTELLA Qua, PA-C   "

## 2024-10-13 ENCOUNTER — Ambulatory Visit: Attending: Student | Admitting: Student

## 2024-10-13 ENCOUNTER — Encounter: Payer: Self-pay | Admitting: Student

## 2024-10-13 VITALS — BP 122/90 | HR 83 | Ht 70.0 in | Wt 235.4 lb

## 2024-10-13 DIAGNOSIS — I4821 Permanent atrial fibrillation: Secondary | ICD-10-CM

## 2024-10-13 DIAGNOSIS — I2510 Atherosclerotic heart disease of native coronary artery without angina pectoris: Secondary | ICD-10-CM | POA: Diagnosis not present

## 2024-10-13 DIAGNOSIS — I10 Essential (primary) hypertension: Secondary | ICD-10-CM | POA: Diagnosis not present

## 2024-10-13 MED ORDER — DILTIAZEM HCL ER COATED BEADS 300 MG PO CP24: 300.0000 mg | ORAL_CAPSULE | Freq: Every day | ORAL | 3 refills | Status: AC

## 2024-10-13 MED ORDER — APIXABAN 5 MG PO TABS: 5.0000 mg | ORAL_TABLET | Freq: Two times a day (BID) | ORAL | 5 refills | Status: AC

## 2024-10-13 NOTE — Patient Instructions (Signed)
 Medication Instructions:  Your physician recommends that you continue on your current medications as directed. Please refer to the Current Medication list given to you today.  *If you need a refill on your cardiac medications before your next appointment, please call your pharmacy*  Lab Work: None If you have labs (blood work) drawn today and your tests are completely normal, you will receive your results only by: MyChart Message (if you have MyChart) OR A paper copy in the mail If you have any lab test that is abnormal or we need to change your treatment, we will call you to review the results.  Testing/Procedures: None  Follow-Up: At [facility], you and your health needs are our priority.  As part of our continuing mission to provide you with exceptional heart care, our providers are all part of one team.  This team includes your primary Cardiologist (physician) and Advanced Practice Providers or APPs (Physician Assistants and Nurse Practitioners) who all work together to provide you with the care you need, when you need it.  Your next appointment:   6 month(s)  Provider:   You may see Jayson Sierras, MD or one of the following Advanced Practice Providers on your designated Care Team:   Laymon Qua, PA-C  [place], [place] Olivia [place], [place]     We recommend signing up for the patient portal called MyChart.  Sign up information is provided on this After Visit Summary.  MyChart is used to connect with patients for Virtual Visits (Telemedicine).  Patients are able to view lab/test results, encounter notes, upcoming appointments, etc.  Non-urgent messages can be sent to your provider as well.   To learn more about what you can do with MyChart, go to [email or web address].   Other Instructions Thank you for choosing [facility]!

## 2024-10-14 ENCOUNTER — Encounter: Payer: Self-pay | Admitting: Student

## 2024-10-15 ENCOUNTER — Telehealth: Payer: Self-pay | Admitting: Cardiology

## 2024-10-15 NOTE — Telephone Encounter (Signed)
 Will forward to preop APP to review if the pt has been cleared. Pt recently seen by Laymon Qua, PAC.

## 2024-10-15 NOTE — Telephone Encounter (Signed)
 Caller (Ginger) is following up on patient's clearance and requested their form be faxed to fax# [phone].

## 2024-10-15 NOTE — Telephone Encounter (Signed)
 Patient had been cleared with a phone visit 12/26. Faxed that note to provided number.   Rollo FABIENE Louder, PA-C 10/15/2024 12:16 PM
